# Patient Record
Sex: Female | Born: 1940 | Race: Black or African American | Hispanic: No | Smoking: Former smoker
Health system: Southern US, Community
[De-identification: ages and names within clinical notes are randomized; demographics above are authoritative.]

## PROBLEM LIST (undated history)

## (undated) DIAGNOSIS — K219 Gastro-esophageal reflux disease without esophagitis: Secondary | ICD-10-CM

## (undated) DIAGNOSIS — N185 Chronic kidney disease, stage 5: Secondary | ICD-10-CM

## (undated) DIAGNOSIS — R112 Nausea with vomiting, unspecified: Secondary | ICD-10-CM

## (undated) DIAGNOSIS — Z8619 Personal history of other infectious and parasitic diseases: Secondary | ICD-10-CM

## (undated) DIAGNOSIS — M1990 Unspecified osteoarthritis, unspecified site: Secondary | ICD-10-CM

## (undated) DIAGNOSIS — D649 Anemia, unspecified: Secondary | ICD-10-CM

## (undated) DIAGNOSIS — Z85038 Personal history of other malignant neoplasm of large intestine: Secondary | ICD-10-CM

## (undated) DIAGNOSIS — M879 Osteonecrosis, unspecified: Secondary | ICD-10-CM

## (undated) DIAGNOSIS — I10 Essential (primary) hypertension: Secondary | ICD-10-CM

## (undated) DIAGNOSIS — E119 Type 2 diabetes mellitus without complications: Secondary | ICD-10-CM

## (undated) DIAGNOSIS — E890 Postprocedural hypothyroidism: Secondary | ICD-10-CM

## (undated) DIAGNOSIS — D369 Benign neoplasm, unspecified site: Secondary | ICD-10-CM

## (undated) DIAGNOSIS — I2510 Atherosclerotic heart disease of native coronary artery without angina pectoris: Secondary | ICD-10-CM

## (undated) DIAGNOSIS — Z923 Personal history of irradiation: Secondary | ICD-10-CM

## (undated) DIAGNOSIS — H3530 Unspecified macular degeneration: Secondary | ICD-10-CM

## (undated) DIAGNOSIS — C50919 Malignant neoplasm of unspecified site of unspecified female breast: Secondary | ICD-10-CM

## (undated) DIAGNOSIS — I639 Cerebral infarction, unspecified: Secondary | ICD-10-CM

## (undated) DIAGNOSIS — I5032 Chronic diastolic (congestive) heart failure: Secondary | ICD-10-CM

## (undated) DIAGNOSIS — G629 Polyneuropathy, unspecified: Secondary | ICD-10-CM

## (undated) DIAGNOSIS — D696 Thrombocytopenia, unspecified: Secondary | ICD-10-CM

## (undated) DIAGNOSIS — H269 Unspecified cataract: Secondary | ICD-10-CM

## (undated) DIAGNOSIS — H409 Unspecified glaucoma: Secondary | ICD-10-CM

## (undated) DIAGNOSIS — N184 Chronic kidney disease, stage 4 (severe): Secondary | ICD-10-CM

## (undated) DIAGNOSIS — N186 End stage renal disease: Secondary | ICD-10-CM

## (undated) DIAGNOSIS — Z7901 Long term (current) use of anticoagulants: Secondary | ICD-10-CM

## (undated) DIAGNOSIS — Z9889 Other specified postprocedural states: Secondary | ICD-10-CM

## (undated) DIAGNOSIS — Z853 Personal history of malignant neoplasm of breast: Secondary | ICD-10-CM

## (undated) DIAGNOSIS — E11311 Type 2 diabetes mellitus with unspecified diabetic retinopathy with macular edema: Secondary | ICD-10-CM

## (undated) DIAGNOSIS — Z972 Presence of dental prosthetic device (complete) (partial): Secondary | ICD-10-CM

## (undated) DIAGNOSIS — I6529 Occlusion and stenosis of unspecified carotid artery: Secondary | ICD-10-CM

## (undated) DIAGNOSIS — K635 Polyp of colon: Secondary | ICD-10-CM

## (undated) DIAGNOSIS — I4891 Unspecified atrial fibrillation: Secondary | ICD-10-CM

## (undated) DIAGNOSIS — Z7902 Long term (current) use of antithrombotics/antiplatelets: Secondary | ICD-10-CM

## (undated) HISTORY — DX: Osteonecrosis, unspecified: M87.9

## (undated) HISTORY — DX: Gastro-esophageal reflux disease without esophagitis: K21.9

## (undated) HISTORY — DX: Personal history of other infectious and parasitic diseases: Z86.19

## (undated) HISTORY — DX: Unspecified osteoarthritis, unspecified site: M19.90

## (undated) HISTORY — DX: Type 2 diabetes mellitus without complications: E11.9

## (undated) HISTORY — PX: EYE SURGERY: SHX253

## (undated) HISTORY — DX: Unspecified macular degeneration: H35.30

## (undated) HISTORY — DX: Unspecified glaucoma: H40.9

## (undated) HISTORY — DX: Type 2 diabetes mellitus with unspecified diabetic retinopathy with macular edema: E11.311

## (undated) HISTORY — DX: Chronic kidney disease, stage 5: N18.5

## (undated) HISTORY — DX: Occlusion and stenosis of unspecified carotid artery: I65.29

## (undated) HISTORY — DX: Polyneuropathy, unspecified: G62.9

## (undated) HISTORY — DX: Unspecified cataract: H26.9

## (undated) HISTORY — PX: THYROIDECTOMY, PARTIAL: SHX18

## (undated) HISTORY — DX: Essential (primary) hypertension: I10

## (undated) HISTORY — DX: Chronic kidney disease, stage 4 (severe): N18.4

## (undated) HISTORY — PX: JOINT REPLACEMENT: SHX530

## (undated) HISTORY — DX: Personal history of malignant neoplasm of breast: Z85.3

## (undated) HISTORY — DX: Personal history of other malignant neoplasm of large intestine: Z85.038

## (undated) HISTORY — DX: Postprocedural hypothyroidism: E89.0

## (undated) HISTORY — PX: VEIN LIGATION AND STRIPPING: SHX2653

## (undated) HISTORY — DX: Anemia, unspecified: D64.9

---

## 1974-07-03 HISTORY — PX: ABDOMINAL HYSTERECTOMY: SHX81

## 1997-07-03 HISTORY — PX: TOTAL KNEE ARTHROPLASTY: SHX125

## 2003-07-04 HISTORY — PX: COLECTOMY: SHX59

## 2003-07-04 HISTORY — PX: CHOLECYSTECTOMY: SHX55

## 2004-07-03 DIAGNOSIS — Z85038 Personal history of other malignant neoplasm of large intestine: Secondary | ICD-10-CM

## 2004-07-03 HISTORY — DX: Personal history of other malignant neoplasm of large intestine: Z85.038

## 2004-07-03 HISTORY — PX: COLON SURGERY: SHX602

## 2006-07-03 DIAGNOSIS — Z853 Personal history of malignant neoplasm of breast: Secondary | ICD-10-CM

## 2006-07-03 DIAGNOSIS — C50919 Malignant neoplasm of unspecified site of unspecified female breast: Secondary | ICD-10-CM

## 2006-07-03 DIAGNOSIS — Z923 Personal history of irradiation: Secondary | ICD-10-CM

## 2006-07-03 HISTORY — PX: COLONOSCOPY: SHX174

## 2006-07-03 HISTORY — PX: BREAST LUMPECTOMY: SHX2

## 2006-07-03 HISTORY — PX: BREAST BIOPSY: SHX20

## 2006-07-03 HISTORY — PX: BREAST MAMMOSITE: SHX5264

## 2006-07-03 HISTORY — DX: Personal history of irradiation: Z92.3

## 2006-07-03 HISTORY — DX: Personal history of malignant neoplasm of breast: Z85.3

## 2006-07-03 HISTORY — DX: Malignant neoplasm of unspecified site of unspecified female breast: C50.919

## 2006-09-19 DIAGNOSIS — C50912 Malignant neoplasm of unspecified site of left female breast: Secondary | ICD-10-CM

## 2006-09-19 HISTORY — DX: Malignant neoplasm of unspecified site of left female breast: C50.912

## 2007-11-01 HISTORY — PX: US ECHOCARDIOGRAPHY: HXRAD669

## 2009-12-04 LAB — LIPID PANEL: HDL: 63 mg/dL (ref 35–70)

## 2012-01-01 DIAGNOSIS — N184 Chronic kidney disease, stage 4 (severe): Secondary | ICD-10-CM

## 2012-01-01 HISTORY — DX: Chronic kidney disease, stage 4 (severe): N18.4

## 2012-01-11 DIAGNOSIS — D518 Other vitamin B12 deficiency anemias: Secondary | ICD-10-CM | POA: Diagnosis not present

## 2012-01-11 DIAGNOSIS — Z1231 Encounter for screening mammogram for malignant neoplasm of breast: Secondary | ICD-10-CM | POA: Diagnosis not present

## 2012-01-11 DIAGNOSIS — E1065 Type 1 diabetes mellitus with hyperglycemia: Secondary | ICD-10-CM | POA: Diagnosis not present

## 2012-01-11 DIAGNOSIS — N189 Chronic kidney disease, unspecified: Secondary | ICD-10-CM | POA: Diagnosis not present

## 2012-01-11 DIAGNOSIS — G9009 Other idiopathic peripheral autonomic neuropathy: Secondary | ICD-10-CM | POA: Diagnosis not present

## 2012-01-11 DIAGNOSIS — E039 Hypothyroidism, unspecified: Secondary | ICD-10-CM | POA: Diagnosis not present

## 2012-01-11 DIAGNOSIS — I10 Essential (primary) hypertension: Secondary | ICD-10-CM | POA: Diagnosis not present

## 2012-01-11 DIAGNOSIS — R5381 Other malaise: Secondary | ICD-10-CM | POA: Diagnosis not present

## 2012-01-11 DIAGNOSIS — E538 Deficiency of other specified B group vitamins: Secondary | ICD-10-CM | POA: Diagnosis not present

## 2012-01-11 DIAGNOSIS — G609 Hereditary and idiopathic neuropathy, unspecified: Secondary | ICD-10-CM | POA: Diagnosis not present

## 2012-01-11 DIAGNOSIS — D649 Anemia, unspecified: Secondary | ICD-10-CM | POA: Diagnosis not present

## 2012-01-11 DIAGNOSIS — E7800 Pure hypercholesterolemia, unspecified: Secondary | ICD-10-CM | POA: Diagnosis not present

## 2012-01-11 DIAGNOSIS — R5383 Other fatigue: Secondary | ICD-10-CM | POA: Diagnosis not present

## 2012-01-11 DIAGNOSIS — R7989 Other specified abnormal findings of blood chemistry: Secondary | ICD-10-CM | POA: Diagnosis not present

## 2012-01-11 LAB — HEPATITIS C ANTIBODY: Hep C Virus Ab: NONREACTIVE

## 2012-01-11 LAB — COMPREHENSIVE METABOLIC PANEL
AST: 14 U/L
Alkaline Phosphatase: 91 U/L
Creat: 3.85
Potassium: 4.5 mmol/L
Total Bilirubin: 0.4 mg/dL

## 2012-01-11 LAB — CBC: WBC: 5.3

## 2012-01-11 LAB — TSH: TSH: 2.57

## 2012-01-11 LAB — T4, FREE: T4,Free (Direct): 0.8

## 2012-01-11 LAB — HOMOCYSTEINE: Homocysteine: 23.6

## 2012-01-17 DIAGNOSIS — E538 Deficiency of other specified B group vitamins: Secondary | ICD-10-CM | POA: Diagnosis not present

## 2012-01-17 DIAGNOSIS — M549 Dorsalgia, unspecified: Secondary | ICD-10-CM | POA: Diagnosis not present

## 2012-01-17 DIAGNOSIS — D518 Other vitamin B12 deficiency anemias: Secondary | ICD-10-CM | POA: Diagnosis not present

## 2012-01-17 DIAGNOSIS — M79609 Pain in unspecified limb: Secondary | ICD-10-CM | POA: Diagnosis not present

## 2012-01-17 DIAGNOSIS — E1065 Type 1 diabetes mellitus with hyperglycemia: Secondary | ICD-10-CM | POA: Diagnosis not present

## 2012-01-17 DIAGNOSIS — N189 Chronic kidney disease, unspecified: Secondary | ICD-10-CM | POA: Diagnosis not present

## 2012-01-17 DIAGNOSIS — I10 Essential (primary) hypertension: Secondary | ICD-10-CM | POA: Diagnosis not present

## 2012-01-22 DIAGNOSIS — C50919 Malignant neoplasm of unspecified site of unspecified female breast: Secondary | ICD-10-CM | POA: Diagnosis not present

## 2012-05-02 DIAGNOSIS — Z23 Encounter for immunization: Secondary | ICD-10-CM | POA: Diagnosis not present

## 2012-10-16 ENCOUNTER — Emergency Department: Payer: Self-pay | Admitting: Emergency Medicine

## 2012-10-16 DIAGNOSIS — M6281 Muscle weakness (generalized): Secondary | ICD-10-CM | POA: Diagnosis not present

## 2012-10-16 DIAGNOSIS — IMO0001 Reserved for inherently not codable concepts without codable children: Secondary | ICD-10-CM | POA: Diagnosis not present

## 2012-10-16 DIAGNOSIS — M169 Osteoarthritis of hip, unspecified: Secondary | ICD-10-CM | POA: Diagnosis not present

## 2012-10-16 DIAGNOSIS — M79609 Pain in unspecified limb: Secondary | ICD-10-CM | POA: Diagnosis not present

## 2012-10-16 DIAGNOSIS — Z760 Encounter for issue of repeat prescription: Secondary | ICD-10-CM | POA: Diagnosis not present

## 2012-10-16 DIAGNOSIS — R030 Elevated blood-pressure reading, without diagnosis of hypertension: Secondary | ICD-10-CM | POA: Diagnosis not present

## 2012-10-16 DIAGNOSIS — I10 Essential (primary) hypertension: Secondary | ICD-10-CM | POA: Diagnosis not present

## 2012-10-16 DIAGNOSIS — R109 Unspecified abdominal pain: Secondary | ICD-10-CM | POA: Diagnosis not present

## 2012-10-16 DIAGNOSIS — E119 Type 2 diabetes mellitus without complications: Secondary | ICD-10-CM | POA: Diagnosis not present

## 2012-10-16 DIAGNOSIS — M25559 Pain in unspecified hip: Secondary | ICD-10-CM | POA: Diagnosis not present

## 2012-10-16 DIAGNOSIS — E039 Hypothyroidism, unspecified: Secondary | ICD-10-CM | POA: Diagnosis not present

## 2012-10-16 LAB — COMPREHENSIVE METABOLIC PANEL WITH GFR
Albumin: 3.9 g/dL
Alkaline Phosphatase: 106 U/L
Anion Gap: 10
BUN: 22 mg/dL — ABNORMAL HIGH
Bilirubin,Total: 0.4 mg/dL
Calcium, Total: 9 mg/dL
Chloride: 106 mmol/L
Co2: 24 mmol/L
Creatinine: 3.93 mg/dL — ABNORMAL HIGH
Glucose: 262 mg/dL — ABNORMAL HIGH
Osmolality: 292
Potassium: 3.8 mmol/L
SGOT(AST): 19 U/L
SGPT (ALT): 21 U/L
Sodium: 140 mmol/L
Total Protein: 7.9 g/dL

## 2012-10-16 LAB — CBC
HGB: 11.7 g/dL — ABNORMAL LOW (ref 12.0–16.0)
MCH: 29.7 pg (ref 26.0–34.0)
MCHC: 32.8 g/dL (ref 32.0–36.0)
MCV: 91 fL (ref 80–100)
Platelet: 132 10*3/uL — ABNORMAL LOW (ref 150–440)
RBC: 3.95 10*6/uL (ref 3.80–5.20)
RDW: 14 % (ref 11.5–14.5)
WBC: 5.8 10*3/uL (ref 3.6–11.0)

## 2012-11-12 DIAGNOSIS — M5380 Other specified dorsopathies, site unspecified: Secondary | ICD-10-CM | POA: Diagnosis not present

## 2012-12-05 ENCOUNTER — Ambulatory Visit (INDEPENDENT_AMBULATORY_CARE_PROVIDER_SITE_OTHER): Payer: Medicare Other | Admitting: Family Medicine

## 2012-12-05 ENCOUNTER — Telehealth: Payer: Self-pay | Admitting: Family Medicine

## 2012-12-05 ENCOUNTER — Encounter: Payer: Self-pay | Admitting: Family Medicine

## 2012-12-05 VITALS — BP 160/90 | HR 80 | Temp 98.2°F | Ht 63.5 in | Wt 236.0 lb

## 2012-12-05 DIAGNOSIS — I6529 Occlusion and stenosis of unspecified carotid artery: Secondary | ICD-10-CM | POA: Insufficient documentation

## 2012-12-05 DIAGNOSIS — Z853 Personal history of malignant neoplasm of breast: Secondary | ICD-10-CM

## 2012-12-05 DIAGNOSIS — M879 Osteonecrosis, unspecified: Secondary | ICD-10-CM

## 2012-12-05 DIAGNOSIS — K219 Gastro-esophageal reflux disease without esophagitis: Secondary | ICD-10-CM

## 2012-12-05 DIAGNOSIS — M8700 Idiopathic aseptic necrosis of unspecified bone: Secondary | ICD-10-CM

## 2012-12-05 DIAGNOSIS — R0989 Other specified symptoms and signs involving the circulatory and respiratory systems: Secondary | ICD-10-CM | POA: Diagnosis not present

## 2012-12-05 DIAGNOSIS — M1990 Unspecified osteoarthritis, unspecified site: Secondary | ICD-10-CM | POA: Diagnosis not present

## 2012-12-05 DIAGNOSIS — E119 Type 2 diabetes mellitus without complications: Secondary | ICD-10-CM | POA: Diagnosis not present

## 2012-12-05 DIAGNOSIS — I10 Essential (primary) hypertension: Secondary | ICD-10-CM

## 2012-12-05 DIAGNOSIS — Z85038 Personal history of other malignant neoplasm of large intestine: Secondary | ICD-10-CM

## 2012-12-05 DIAGNOSIS — E890 Postprocedural hypothyroidism: Secondary | ICD-10-CM | POA: Diagnosis not present

## 2012-12-05 DIAGNOSIS — E118 Type 2 diabetes mellitus with unspecified complications: Secondary | ICD-10-CM | POA: Insufficient documentation

## 2012-12-05 DIAGNOSIS — E1169 Type 2 diabetes mellitus with other specified complication: Secondary | ICD-10-CM | POA: Insufficient documentation

## 2012-12-05 MED ORDER — CARVEDILOL 25 MG PO TABS: 25.0000 mg | ORAL_TABLET | Freq: Every day | ORAL | Status: DC

## 2012-12-05 MED ORDER — THYROID 60 MG PO TABS: 60.0000 mg | ORAL_TABLET | Freq: Every day | ORAL | Status: DC

## 2012-12-05 MED ORDER — FUROSEMIDE 40 MG PO TABS: 40.0000 mg | ORAL_TABLET | Freq: Every day | ORAL | Status: DC

## 2012-12-05 MED ORDER — ANASTROZOLE 1 MG PO TABS: 1.0000 mg | ORAL_TABLET | Freq: Every day | ORAL | Status: DC

## 2012-12-05 MED ORDER — GLIMEPIRIDE 1 MG PO TABS: 1.0000 mg | ORAL_TABLET | Freq: Every day | ORAL | Status: DC

## 2012-12-05 MED ORDER — ISOSORBIDE DINITRATE 20 MG PO TABS: 20.0000 mg | ORAL_TABLET | Freq: Every day | ORAL | Status: DC

## 2012-12-05 MED ORDER — SAXAGLIPTIN HCL 5 MG PO TABS: 5.0000 mg | ORAL_TABLET | Freq: Every day | ORAL | Status: DC

## 2012-12-05 MED ORDER — HYDRALAZINE HCL 50 MG PO TABS: 50.0000 mg | ORAL_TABLET | ORAL | Status: DC

## 2012-12-05 MED ORDER — MELOXICAM 15 MG PO TABS: 15.0000 mg | ORAL_TABLET | Freq: Every day | ORAL | Status: DC

## 2012-12-05 MED ORDER — ALLOPURINOL 100 MG PO TABS: 100.0000 mg | ORAL_TABLET | Freq: Every day | ORAL | Status: DC

## 2012-12-05 MED ORDER — PENTOXIFYLLINE ER 400 MG PO TBCR: 400.0000 mg | EXTENDED_RELEASE_TABLET | Freq: Every day | ORAL | Status: DC

## 2012-12-05 MED ORDER — MELOXICAM 15 MG PO TABS: 15.0000 mg | ORAL_TABLET | Freq: Every day | ORAL | Status: DC | PRN

## 2012-12-05 MED ORDER — CYCLOBENZAPRINE HCL 5 MG PO TABS: 5.0000 mg | ORAL_TABLET | Freq: Four times a day (QID) | ORAL | Status: DC | PRN

## 2012-12-05 NOTE — Assessment & Plan Note (Signed)
Await records.  Doesn't sound like she's been on bisphosphonate in past.

## 2012-12-05 NOTE — Assessment & Plan Note (Signed)
Check A1c when returns fasting for blood work prior to [facility] visit. Review #s then. Continue onglyza and amaryl until then. Endorses good control with last A1c 6.9%

## 2012-12-05 NOTE — Telephone Encounter (Signed)
Yes this is right according to patient.

## 2012-12-05 NOTE — Telephone Encounter (Signed)
Pharmacy advised  

## 2012-12-05 NOTE — Assessment & Plan Note (Signed)
New, bilateral.  Pt has not had imaging done in past - will check [place]. Risk factors present include HTN, DM.   On trental for presumed PAD.

## 2012-12-05 NOTE — Assessment & Plan Note (Signed)
S/p colectomy. Per pt last colonoscopy 2010, due next year.

## 2012-12-05 NOTE — Assessment & Plan Note (Signed)
Continue armour thyroid.  Will need TSH next visit.

## 2012-12-05 NOTE — Assessment & Plan Note (Signed)
Elevated today, anticipate due to new doctor and office.  Pt states runs low at home.  Advised to monitor and notify me if persistently >140/90.  No changes today.

## 2012-12-05 NOTE — Assessment & Plan Note (Signed)
Continue meloxicam.  Will need Cr checked at physical. Did not tolerate tramadol from ER.

## 2012-12-05 NOTE — Telephone Encounter (Signed)
[facility] asking for clarification on Hydralazine RX sent in today.  RX says Take 1 tablet (50mg ) total by mouth 2 times a week.  They want to make sure it is to be taken weekly.  Please advise.

## 2012-12-05 NOTE — Patient Instructions (Addendum)
Schedule appointment with [facility]. Pass by [facility] office to schedule appointment for neck artery ultrasound. Return at your convenience fasting for blood work and afterwards for [facility] visit. Let me know sooner if blood pressure consistently >140/90 at home. Good to meet you today, call us with questions.

## 2012-12-05 NOTE — Progress Notes (Signed)
Subjective:    Patient ID: Alexis Tucker, female    DOB: [birth year], 72 y.o.   MRN: [id number]  HPI CC: new pt to establish  Prior saw Dr. Thurston Hole in [place].  Records have been requested.  Stays stressed - brother died 11-12-11, daughter had stroke 3 yrs ago.  HTN - elevated today.  Normally runs low at home. DM - states last A1c was 6.9%.  Due for eye exam. Denies h/o HLD. Hypothyroid after surgical thyroidectomy. States no h/o cardiac problems.  Recent trip to [facility] for arthritis, treated with flexeril and tramadol.  Tramadol caused nightmares so she stopped.  Preventative: Last CPE 2008 Well woman was 2008 (OBGYN at [facility]).  Looking to establish with local OBGYN. Colonoscopy 2010 - rec rpt 5 yrs. Mammogram 01/2012, normal.  Due next month. Pneumovax 2011 Flu shot - yearly.  Lives alone, 1 dog.   Granddaughter in [place]. Divorced Occu: retired, worked in Engineer, technical sales Edu: college  Medications and allergies reviewed and updated in chart.  Past histories reviewed and updated if relevant as below. There are no active problems to display for this patient.  Past Medical History  Diagnosis Date  . Post-surgical hypothyroidism   . OA (osteoarthritis)   . Diabetes type 2, controlled 2000s  . HTN (hypertension)   . Osteonecrosis     hips?  . History of breast cancer 2008    s/p mammosite  . History of colon cancer 2006    s/p colectomy  . GERD (gastroesophageal reflux disease)   . Hepatitis A 1990s    from food, resolved   Past Surgical History  Procedure Laterality Date  . Abdominal hysterectomy  1976    after tubal pregnancy, h/o fibroids with heavy bleeding  . Total knee arthroplasty Right 1999  . Thyroidectomy, partial      unclear why  . Cholecystectomy  2005  . Colectomy  2005    colon cancer  . Colon surgery  2006    bowel obstruction  . Breast mammosite  2008   History  Substance Use Topics  . Smoking status: Never Smoker   . Smokeless tobacco:  Never Used  . Alcohol Use: No     Comment: rare   Family History  Problem Relation Age of Onset  . Diabetes Mother   . Hypertension Mother   . Alcohol abuse Father   . Arthritis Mother   . Arthritis Father   . Stroke Daughter   . CAD Neg Hx   . Cancer Neg Hx    Allergies  Allergen Reactions  . Codeine   . Tramadol Other (See Comments)    nightmares  . Sulfa Antibiotics Itching and Rash   No current outpatient prescriptions on file prior to visit.   No current facility-administered medications on file prior to visit.     Review of Systems  Constitutional: Negative for fever, chills, activity change, appetite change, fatigue and unexpected weight change.  HENT: Negative for hearing loss and neck pain.   Eyes: Negative for visual disturbance.  Respiratory: Negative for cough, chest tightness, shortness of breath and wheezing.   Cardiovascular: Negative for chest pain, palpitations and leg swelling.  Gastrointestinal: Negative for nausea, vomiting, abdominal pain, diarrhea, constipation, blood in stool and abdominal distention.  Genitourinary: Negative for hematuria and difficulty urinating.  Musculoskeletal: Negative for myalgias and arthralgias.  Skin: Negative for rash.  Neurological: Negative for dizziness, seizures, syncope and headaches.  Hematological: Negative for adenopathy. Does not bruise/bleed easily.  Psychiatric/Behavioral:  Negative for dysphoric mood. The patient is not nervous/anxious.        Objective:   Physical Exam  Nursing note and vitals reviewed. Constitutional: She is oriented to person, place, and time. She appears well-developed and well-nourished. No distress.  Stiff with movements  HENT:  Head: Normocephalic and atraumatic.  Mouth/Throat: Oropharynx is clear and moist. No oropharyngeal exudate.  Eyes: Conjunctivae and EOM are normal. Pupils are equal, round, and reactive to light. No scleral icterus.  Neck: Normal range of motion. Neck  supple. Carotid bruit is present (R>L bruit). No thyromegaly present.  Cardiovascular: Normal rate, regular rhythm, normal heart sounds and intact distal pulses.   No murmur heard. Pulses:      Radial pulses are 2+ on the right side, and 2+ on the left side.  Pulmonary/Chest: Effort normal and breath sounds normal. No respiratory distress. She has no wheezes. She has no rales.  Abdominal: Soft. Bowel sounds are normal. She exhibits no distension and no mass. There is no tenderness. There is no rebound and no guarding.  Musculoskeletal: Normal range of motion. She exhibits no edema.  Lymphadenopathy:    She has no cervical adenopathy.  Neurological: She is alert and oriented to person, place, and time.  CN grossly intact, station and gait intact  Skin: Skin is warm and dry. No rash noted.  Psychiatric: She has a normal mood and affect. Her behavior is normal. Judgment and thought content normal.       Assessment & Plan:

## 2012-12-05 NOTE — Assessment & Plan Note (Signed)
2008, s/p mammosite, currently on arimidex Gets mammogram yearly, due next month.  Requests to defer scheduling until wellness exam.

## 2012-12-15 ENCOUNTER — Encounter: Payer: Self-pay | Admitting: Family Medicine

## 2012-12-16 ENCOUNTER — Telehealth: Payer: Self-pay | Admitting: Family Medicine

## 2012-12-16 DIAGNOSIS — H524 Presbyopia: Secondary | ICD-10-CM | POA: Diagnosis not present

## 2012-12-16 DIAGNOSIS — H2510 Age-related nuclear cataract, unspecified eye: Secondary | ICD-10-CM | POA: Diagnosis not present

## 2012-12-16 DIAGNOSIS — H40019 Open angle with borderline findings, low risk, unspecified eye: Secondary | ICD-10-CM | POA: Diagnosis not present

## 2012-12-16 DIAGNOSIS — H3530 Unspecified macular degeneration: Secondary | ICD-10-CM | POA: Diagnosis not present

## 2012-12-16 DIAGNOSIS — E119 Type 2 diabetes mellitus without complications: Secondary | ICD-10-CM | POA: Diagnosis not present

## 2012-12-16 NOTE — Telephone Encounter (Signed)
plxz notify I reviewed records from prior PCP - she has significant kidney insufficiency and chronic kidney disease that merits close f/u - I want to be sure she's scheduled for fasting labwork and afterwards for medicare wellness visit - will likely recommend establishing with kidney doctor.

## 2012-12-18 ENCOUNTER — Encounter: Payer: Self-pay | Admitting: Family Medicine

## 2012-12-18 NOTE — Telephone Encounter (Signed)
Message left for patient to return my call.  

## 2012-12-19 ENCOUNTER — Encounter: Payer: Self-pay | Admitting: Family Medicine

## 2012-12-20 ENCOUNTER — Encounter: Payer: Self-pay | Admitting: Family Medicine

## 2012-12-23 ENCOUNTER — Encounter: Payer: Self-pay | Admitting: Family Medicine

## 2012-12-24 NOTE — Telephone Encounter (Signed)
Message left for patient to return my call.  

## 2012-12-25 ENCOUNTER — Encounter: Payer: Self-pay | Admitting: Family Medicine

## 2012-12-27 NOTE — Telephone Encounter (Signed)
Message left for patient to return my call.  

## 2012-12-31 ENCOUNTER — Encounter: Payer: Self-pay | Admitting: *Deleted

## 2012-12-31 HISTORY — PX: OTHER SURGICAL HISTORY: SHX169

## 2012-12-31 NOTE — Telephone Encounter (Signed)
Message left for patient to return my call and schedule appts. Letter mailed also since unable to reach patient by phone.

## 2013-01-06 ENCOUNTER — Other Ambulatory Visit: Payer: Self-pay | Admitting: Family Medicine

## 2013-01-06 ENCOUNTER — Other Ambulatory Visit (INDEPENDENT_AMBULATORY_CARE_PROVIDER_SITE_OTHER): Payer: Medicare Other

## 2013-01-06 DIAGNOSIS — I10 Essential (primary) hypertension: Secondary | ICD-10-CM

## 2013-01-06 DIAGNOSIS — E119 Type 2 diabetes mellitus without complications: Secondary | ICD-10-CM

## 2013-01-06 DIAGNOSIS — E890 Postprocedural hypothyroidism: Secondary | ICD-10-CM

## 2013-01-06 DIAGNOSIS — N184 Chronic kidney disease, stage 4 (severe): Secondary | ICD-10-CM

## 2013-01-06 DIAGNOSIS — E1122 Type 2 diabetes mellitus with diabetic chronic kidney disease: Secondary | ICD-10-CM | POA: Insufficient documentation

## 2013-01-06 LAB — RENAL FUNCTION PANEL
Albumin: 3.7 g/dL (ref 3.5–5.2)
BUN: 35 mg/dL — ABNORMAL HIGH (ref 6–23)
CO2: 20 mEq/L (ref 19–32)
Chloride: 112 mEq/L (ref 96–112)
GFR: 13 mL/min — CL (ref >60.00)
Phosphorus: 4.1 mg/dL (ref 2.3–4.6)
Potassium: 4.7 mEq/L (ref 3.5–5.1)

## 2013-01-06 LAB — CBC WITH DIFFERENTIAL/PLATELET
Basophils Absolute: 0 10*3/uL (ref 0.0–0.1)
Basophils Relative: 0.6 % (ref 0.0–3.0)
Eosinophils Absolute: 0.1 10*3/uL (ref 0.0–0.7)
Hemoglobin: 10.7 g/dL — ABNORMAL LOW (ref 12.0–15.0)
Lymphocytes Relative: 28.5 % (ref 12.0–46.0)
MCHC: 32 g/dL (ref 30.0–36.0)
Monocytes Relative: 11.2 % (ref 3.0–12.0)
Neutro Abs: 2.9 10*3/uL (ref 1.4–7.7)
Neutrophils Relative %: 57.2 % (ref 43.0–77.0)
RBC: 3.62 Mil/uL — ABNORMAL LOW (ref 3.87–5.11)
WBC: 5.1 10*3/uL (ref 4.5–10.5)

## 2013-01-06 LAB — LIPID PANEL: Cholesterol: 178 mg/dL (ref 0–200)

## 2013-01-06 LAB — HEMOGLOBIN A1C: Hgb A1c MFr Bld: 7.6 % — ABNORMAL HIGH (ref 4.6–6.5)

## 2013-01-07 ENCOUNTER — Ambulatory Visit (INDEPENDENT_AMBULATORY_CARE_PROVIDER_SITE_OTHER): Payer: Medicare Other | Admitting: Family Medicine

## 2013-01-07 ENCOUNTER — Encounter: Payer: Self-pay | Admitting: Family Medicine

## 2013-01-07 VITALS — BP 122/64 | HR 68 | Temp 97.8°F | Ht 63.5 in | Wt 241.0 lb

## 2013-01-07 DIAGNOSIS — R0989 Other specified symptoms and signs involving the circulatory and respiratory systems: Secondary | ICD-10-CM

## 2013-01-07 DIAGNOSIS — Z1231 Encounter for screening mammogram for malignant neoplasm of breast: Secondary | ICD-10-CM | POA: Diagnosis not present

## 2013-01-07 DIAGNOSIS — N184 Chronic kidney disease, stage 4 (severe): Secondary | ICD-10-CM | POA: Diagnosis not present

## 2013-01-07 DIAGNOSIS — E119 Type 2 diabetes mellitus without complications: Secondary | ICD-10-CM | POA: Diagnosis not present

## 2013-01-07 DIAGNOSIS — I10 Essential (primary) hypertension: Secondary | ICD-10-CM

## 2013-01-07 DIAGNOSIS — M1990 Unspecified osteoarthritis, unspecified site: Secondary | ICD-10-CM

## 2013-01-07 DIAGNOSIS — E890 Postprocedural hypothyroidism: Secondary | ICD-10-CM

## 2013-01-07 DIAGNOSIS — Z0000 Encounter for general adult medical examination without abnormal findings: Secondary | ICD-10-CM

## 2013-01-07 DIAGNOSIS — Z853 Personal history of malignant neoplasm of breast: Secondary | ICD-10-CM | POA: Diagnosis not present

## 2013-01-07 LAB — TSH: TSH: 3.39 u[IU]/mL (ref 0.35–5.50)

## 2013-01-07 LAB — PTH, INTACT AND CALCIUM: Calcium: 8.9 mg/dL (ref 8.4–10.5)

## 2013-01-07 MED ORDER — DICLOFENAC SODIUM 1 % TD GEL: 1.0000 "application " | Freq: Three times a day (TID) | TRANSDERMAL | Status: DC

## 2013-01-07 NOTE — Assessment & Plan Note (Signed)
Continue to follow with ortho. I advised she should not take any NSAIDs including mobic and aleve.

## 2013-01-07 NOTE — Progress Notes (Signed)
Subjective:    Patient ID: Alexis Tucker, female    DOB: [birth year], 72 y.o.   MRN: [id number]  HPI CC medicare wellness visit  Some tinnitus. 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  Prior PCP was nephrologist.  I have suggested referral to nephrology today. Pending ultrasound of carotids for bruit heard last visit.  Recently saw eye doctor. Known R eye trouble Passes hearing screen today. Fall in garage recently - when walking groceries into house, tripped over dog and groceries.  Did not injure herself. Denies depression/anhedonia, sadness.  Preventative:  No h/o medicare wellness visit.Well woman was 2008 (OBGYN at [facility]). Looking to establish with local OBGYN.  Planning on calling to establish with local. Colonoscopy 2010 - rec rpt 5 yrs.  Mammogram 01/2012, normal. Due this month. Would like to set up at [facility]. Pneumovax 2011  Flu shot - yearly.  Tetanus - unsure, thinks >10 yrs. Shingles shot - would like to receive at [facility]. dexa scan - has had, thinks about 10 yrs ago. Advanced directives/living will - has discussed with daughter.would like info on this.  Lives alone, 1 dog. Granddaughter in [place].  Divorced  Occu: retired, worked in Engineer, technical sales  Edu: college  Medications and allergies reviewed and updated in chart.  Past histories reviewed and updated if relevant as below. Patient Active Problem List   Diagnosis Date Noted  . Medicare annual wellness visit, initial 01/07/2013  . Chronic kidney disease, stage IV (severe) 01/06/2013  . Carotid bruit 12/05/2012  . Post-surgical hypothyroidism   . OA (osteoarthritis)   . Diabetes type 2, controlled   . HTN (hypertension)   . Osteonecrosis   . History of breast cancer   . History of colon cancer   . GERD (gastroesophageal reflux disease)    Past Medical History  Diagnosis Date  . Post-surgical hypothyroidism   . OA (osteoarthritis)   .  Diabetes type 2, controlled 2000s  . HTN (hypertension)   . Osteonecrosis     hips?  . 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 History of colon cancer 2006    s/p colectomy  . GERD (gastroesophageal reflux disease)   . History of hepatitis A 1990s    from food, resolved  . CKD (chronic kidney disease) stage 4, GFR 15-29 ml/min 01/2012    per prior pcp records, Cr 3.85, in 3s since 2010  . Neuropathy     per prior pcp  . Anemia     per prior pcp  . DJD (degenerative joint disease)     per prior pcp  . 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 Cataracts, bilateral    Past Surgical History  Procedure Laterality Date  . Abdominal hysterectomy  1976    after tubal pregnancy, h/o fibroids with heavy bleeding  . Total knee arthroplasty Right 1999  . Thyroidectomy, partial      unclear why  . Cholecystectomy  2005  . Colectomy  2005    colon cancer  . Colon surgery  2006    bowel obstruction  . Breast mammosite  2008  . Colonoscopy  2008    ext hem, ileo-colonic anastomosis in cecum, tubular adenoma x1, rec rpt 1 yr for multiple polyps (in [place])  . US echocardiography  11/2007    nl LV fxn EF 60%,  diastolic dysfunction, LVH, tr TR and MR, slcerotic aortic valve   History  Substance Use Topics  . Smoking status: Former Research scientist (life sciences)  . Smokeless tobacco: Never Used  . Alcohol Use: No     Comment: rare   Family History  Problem Relation Age of Onset  . Diabetes Mother   . Hypertension Mother   . Alcohol abuse Father   . Arthritis Mother   . Arthritis Father   . Stroke Daughter   . CAD Neg Hx   . Cancer Neg Hx    Allergies  Allergen Reactions  . Codeine   . Tramadol Other (See Comments)    nightmares  . Sulfa Antibiotics Itching and Rash   Current Outpatient Prescriptions on File Prior to Visit  Medication Sig Dispense Refill  . Acetylcysteine (N-ACETYL-L-CYSTEINE) 600 MG CAPS Take 1  capsule by mouth daily.      Marland Kitchen allopurinol (ZYLOPRIM) 100 MG tablet Take 1 tablet (100 mg total) by mouth daily.  90 tablet  3  . Alpha-Lipoic Acid 100 MG CAPS Take 1 capsule by mouth daily.      Marland Kitchen anastrozole (ARIMIDEX) 1 MG tablet Take 1 tablet (1 mg total) by mouth daily.  90 tablet  3  . Calcium Carb-Cholecalciferol (CALCIUM 1000 + D PO) Take 1 tablet by mouth.      . carvedilol (COREG) 25 MG tablet Take 1 tablet (25 mg total) by mouth daily.  90 tablet  3  . Cholecalciferol (VITAMIN D-3) 5000 UNITS TABS Take 1 tablet by mouth daily.      . Coenzyme Q10 75 MG CAPS Take 1 capsule by mouth daily.      . Cyanocobalamin (B-12) 2500 MCG SUBL Place 1 tablet under the tongue daily.      . cyclobenzaprine (FLEXERIL) 5 MG tablet Take 1 tablet (5 mg total) by mouth 4 (four) times daily as needed.  30 tablet  3  . Deanol Bitartrate (DMAE BITARTRATE) POWD by Does not apply route.      . folic acid (FOLVITE) Q000111Q MCG tablet Take 400 mcg by mouth daily.      . furosemide (LASIX) 40 MG tablet Take 1 tablet (40 mg total) by mouth daily.  90 tablet  3  . glimepiride (AMARYL) 1 MG tablet Take 1 tablet (1 mg total) by mouth daily before breakfast.  90 tablet  3  . hydrALAZINE (APRESOLINE) 50 MG tablet Take 1 tablet (50 mg total) by mouth 2 (two) times a week.  90 tablet  3  . isosorbide dinitrate (ISORDIL) 20 MG tablet Take 1 tablet (20 mg total) by mouth daily.  90 tablet  3  . LevOCARNitine L-Tartrate (L-CARNITINE) 500 MG CAPS Take 1 capsule by mouth daily.      . NON FORMULARY Chinese chlorella 500mg       . NON FORMULARY Immune essentials, phosphatidylcholine, iodoral, ultimate flora probiotic      . Omega Fatty Acids-Phytosterols (PA FISH OIL/PHYTOSTEROLS) 450-400 MG CAPS Take 1 tablet by mouth daily.      . pentoxifylline (TRENTAL) 400 MG CR tablet Take 1 tablet (400 mg total) by mouth daily.  90 tablet  3  . Raspberry Ketones 100 MG CAPS Take 1 capsule by mouth daily.      . saxagliptin HCl (ONGLYZA) 5  MG TABS tablet Take 1 tablet (5 mg total) by mouth daily.  90 tablet  3  . selenium 50 MCG TABS Take 50 mcg by mouth daily.      Marland Kitchen  thyroid (ARMOUR) 60 MG tablet Take 1 tablet (60 mg total) by mouth daily.  90 tablet  3   No current facility-administered medications on file prior to visit.     Review of Systems  Constitutional: Negative for fever, chills, activity change, appetite change, fatigue and unexpected weight change.  HENT: Negative for hearing loss and neck pain.   Eyes: Negative for visual disturbance.  Respiratory: Negative for cough, chest tightness, shortness of breath and wheezing.   Cardiovascular: Negative for chest pain, palpitations and leg swelling.  Gastrointestinal: Negative for nausea, vomiting, abdominal pain, diarrhea, constipation, blood in stool and abdominal distention.  Genitourinary: Negative for hematuria and difficulty urinating.  Musculoskeletal: Negative for myalgias and arthralgias.  Skin: Negative for rash.  Neurological: Negative for dizziness, seizures, syncope and headaches.  Hematological: Negative for adenopathy. Does not bruise/bleed easily.  Psychiatric/Behavioral: Negative for dysphoric mood. The patient is not nervous/anxious.        Objective:   Physical Exam  Nursing note and vitals reviewed. Constitutional: She is oriented to person, place, and time. She appears well-developed and well-nourished. No distress.  Stiff with movements  HENT:  Head: Normocephalic and atraumatic.  Right Ear: External ear normal.  Left Ear: External ear normal.  Nose: Nose normal.  Mouth/Throat: Oropharynx is clear and moist. No oropharyngeal exudate.  Eyes: Conjunctivae and EOM are normal. Pupils are equal, round, and reactive to light. No scleral icterus.  Neck: Normal range of motion. Neck supple. Carotid bruit is present (R>L bruit). No thyromegaly present.  Cardiovascular: Normal rate, regular rhythm, normal heart sounds and intact distal pulses.   No  murmur heard. Pulses:      Radial pulses are 2+ on the right side, and 2+ on the left side.  Pulmonary/Chest: Effort normal and breath sounds normal. No respiratory distress. She has no wheezes. She has no rales.  Abdominal: Soft. Bowel sounds are normal. She exhibits no distension and no mass. There is no tenderness. There is no rebound and no guarding.  Musculoskeletal: Normal range of motion. She exhibits no edema.  Lymphadenopathy:    She has no cervical adenopathy.  Neurological: She is alert and oriented to person, place, and time.  CN grossly intact, station and gait intact  Skin: Skin is warm and dry. No rash noted.  Psychiatric: She has a normal mood and affect. Her behavior is normal. Judgment and thought content normal.       Assessment & Plan:

## 2013-01-07 NOTE — Assessment & Plan Note (Signed)
Stage 4-5 today.  Refer to nephrologist to establish.

## 2013-01-07 NOTE — Assessment & Plan Note (Signed)
I have personally reviewed the Medicare Annual Wellness questionnaire and have noted 1. The patient's medical and social history 2. Their use of alcohol, tobacco or illicit drugs 3. Their current medications and supplements 4. The patient's functional ability including ADL's, fall risks, home safety risks and hearing or visual impairment. 5. Diet and physical activity 6. Evidence for depression or mood disorders The patients weight, height, BMI have been recorded in the chart.  Hearing and vision has been addressed. I have made referrals, counseling and provided education to the patient based review of the above and I have provided the pt with a written personalized care plan for preventive services. See scanned questionairre. Advanced directives discussed: provided with packet today.  Pt interested in reading about this.  Reviewed preventative protocols and updated unless pt declined. Colonoscopy due next year. Mammogram referral placed today.

## 2013-01-07 NOTE — Assessment & Plan Note (Signed)
Chronic, stable. Continue meds. 

## 2013-01-07 NOTE — Patient Instructions (Addendum)
Let's stop meloxicam and aleve - as these can affect kidneys.  May try voltaren gel and may use tylenol. Pass by [facility]'s office for referral to establish with kidney doctor and for mammogram referral. Advanced directive packet info provided today. Good to see you today, call us with questions. Return as needed or in 3 months for follow up. No changes to medicines otherwise today.

## 2013-01-07 NOTE — Assessment & Plan Note (Signed)
TSH stable.  

## 2013-01-07 NOTE — Assessment & Plan Note (Signed)
Referred for mammogram today.

## 2013-01-07 NOTE — Addendum Note (Signed)
Addended by: Ria Bush on: 01/07/2013 04:28 PM   Modules accepted: Orders

## 2013-01-07 NOTE — Assessment & Plan Note (Signed)
Elevated A1c, however no changes today. will start with establishing local nephrologist.

## 2013-01-07 NOTE — Assessment & Plan Note (Signed)
Ultrasound pending for tomorrow.

## 2013-01-08 ENCOUNTER — Encounter (INDEPENDENT_AMBULATORY_CARE_PROVIDER_SITE_OTHER): Payer: Medicare Other

## 2013-01-08 DIAGNOSIS — I6529 Occlusion and stenosis of unspecified carotid artery: Secondary | ICD-10-CM

## 2013-01-08 DIAGNOSIS — R0989 Other specified symptoms and signs involving the circulatory and respiratory systems: Secondary | ICD-10-CM | POA: Diagnosis not present

## 2013-01-09 ENCOUNTER — Other Ambulatory Visit: Payer: Self-pay

## 2013-01-10 DIAGNOSIS — I6529 Occlusion and stenosis of unspecified carotid artery: Secondary | ICD-10-CM

## 2013-01-10 HISTORY — DX: Occlusion and stenosis of unspecified carotid artery: I65.29

## 2013-01-12 ENCOUNTER — Encounter: Payer: Self-pay | Admitting: Family Medicine

## 2013-01-16 DIAGNOSIS — H4010X0 Unspecified open-angle glaucoma, stage unspecified: Secondary | ICD-10-CM | POA: Diagnosis not present

## 2013-02-04 ENCOUNTER — Ambulatory Visit: Payer: Self-pay | Admitting: Family Medicine

## 2013-02-04 DIAGNOSIS — Z9889 Other specified postprocedural states: Secondary | ICD-10-CM | POA: Diagnosis not present

## 2013-02-04 DIAGNOSIS — Z853 Personal history of malignant neoplasm of breast: Secondary | ICD-10-CM | POA: Diagnosis not present

## 2013-02-04 DIAGNOSIS — R928 Other abnormal and inconclusive findings on diagnostic imaging of breast: Secondary | ICD-10-CM | POA: Diagnosis not present

## 2013-02-06 DIAGNOSIS — E1129 Type 2 diabetes mellitus with other diabetic kidney complication: Secondary | ICD-10-CM | POA: Diagnosis not present

## 2013-02-06 DIAGNOSIS — D649 Anemia, unspecified: Secondary | ICD-10-CM | POA: Diagnosis not present

## 2013-02-06 DIAGNOSIS — IMO0001 Reserved for inherently not codable concepts without codable children: Secondary | ICD-10-CM | POA: Diagnosis not present

## 2013-02-06 DIAGNOSIS — N185 Chronic kidney disease, stage 5: Secondary | ICD-10-CM | POA: Diagnosis not present

## 2013-02-06 DIAGNOSIS — I10 Essential (primary) hypertension: Secondary | ICD-10-CM | POA: Diagnosis not present

## 2013-02-10 ENCOUNTER — Encounter: Payer: Self-pay | Admitting: Family Medicine

## 2013-02-11 ENCOUNTER — Encounter: Payer: Self-pay | Admitting: Family Medicine

## 2013-02-13 ENCOUNTER — Encounter: Payer: Self-pay | Admitting: *Deleted

## 2013-02-24 DIAGNOSIS — E785 Hyperlipidemia, unspecified: Secondary | ICD-10-CM | POA: Diagnosis not present

## 2013-02-24 DIAGNOSIS — N185 Chronic kidney disease, stage 5: Secondary | ICD-10-CM | POA: Diagnosis not present

## 2013-02-24 DIAGNOSIS — I10 Essential (primary) hypertension: Secondary | ICD-10-CM | POA: Diagnosis not present

## 2013-02-24 DIAGNOSIS — Z01810 Encounter for preprocedural cardiovascular examination: Secondary | ICD-10-CM | POA: Diagnosis not present

## 2013-02-28 ENCOUNTER — Encounter: Payer: Self-pay | Admitting: Family Medicine

## 2013-03-17 DIAGNOSIS — E1129 Type 2 diabetes mellitus with other diabetic kidney complication: Secondary | ICD-10-CM | POA: Diagnosis not present

## 2013-03-17 DIAGNOSIS — N185 Chronic kidney disease, stage 5: Secondary | ICD-10-CM | POA: Diagnosis not present

## 2013-03-17 DIAGNOSIS — D631 Anemia in chronic kidney disease: Secondary | ICD-10-CM | POA: Diagnosis not present

## 2013-03-17 DIAGNOSIS — I10 Essential (primary) hypertension: Secondary | ICD-10-CM | POA: Diagnosis not present

## 2013-03-27 DIAGNOSIS — N184 Chronic kidney disease, stage 4 (severe): Secondary | ICD-10-CM | POA: Diagnosis not present

## 2013-03-27 DIAGNOSIS — E785 Hyperlipidemia, unspecified: Secondary | ICD-10-CM | POA: Diagnosis not present

## 2013-03-27 DIAGNOSIS — I10 Essential (primary) hypertension: Secondary | ICD-10-CM | POA: Diagnosis not present

## 2013-04-11 DIAGNOSIS — E1129 Type 2 diabetes mellitus with other diabetic kidney complication: Secondary | ICD-10-CM | POA: Diagnosis not present

## 2013-04-11 DIAGNOSIS — I10 Essential (primary) hypertension: Secondary | ICD-10-CM | POA: Diagnosis not present

## 2013-04-11 DIAGNOSIS — N185 Chronic kidney disease, stage 5: Secondary | ICD-10-CM | POA: Diagnosis not present

## 2013-04-11 DIAGNOSIS — Z23 Encounter for immunization: Secondary | ICD-10-CM | POA: Diagnosis not present

## 2013-04-16 DIAGNOSIS — N185 Chronic kidney disease, stage 5: Secondary | ICD-10-CM | POA: Diagnosis not present

## 2013-04-16 DIAGNOSIS — E1129 Type 2 diabetes mellitus with other diabetic kidney complication: Secondary | ICD-10-CM | POA: Diagnosis not present

## 2013-04-16 DIAGNOSIS — I10 Essential (primary) hypertension: Secondary | ICD-10-CM | POA: Diagnosis not present

## 2013-04-16 DIAGNOSIS — D631 Anemia in chronic kidney disease: Secondary | ICD-10-CM | POA: Diagnosis not present

## 2013-04-22 DIAGNOSIS — H4010X0 Unspecified open-angle glaucoma, stage unspecified: Secondary | ICD-10-CM | POA: Diagnosis not present

## 2013-05-09 DIAGNOSIS — E1129 Type 2 diabetes mellitus with other diabetic kidney complication: Secondary | ICD-10-CM | POA: Diagnosis not present

## 2013-05-09 DIAGNOSIS — I10 Essential (primary) hypertension: Secondary | ICD-10-CM | POA: Diagnosis not present

## 2013-05-09 DIAGNOSIS — N185 Chronic kidney disease, stage 5: Secondary | ICD-10-CM | POA: Diagnosis not present

## 2013-05-12 DIAGNOSIS — N2581 Secondary hyperparathyroidism of renal origin: Secondary | ICD-10-CM | POA: Diagnosis not present

## 2013-05-12 DIAGNOSIS — N185 Chronic kidney disease, stage 5: Secondary | ICD-10-CM | POA: Diagnosis not present

## 2013-05-12 DIAGNOSIS — I10 Essential (primary) hypertension: Secondary | ICD-10-CM | POA: Diagnosis not present

## 2013-07-07 DIAGNOSIS — I10 Essential (primary) hypertension: Secondary | ICD-10-CM | POA: Diagnosis not present

## 2013-07-07 DIAGNOSIS — E1129 Type 2 diabetes mellitus with other diabetic kidney complication: Secondary | ICD-10-CM | POA: Diagnosis not present

## 2013-07-07 DIAGNOSIS — N185 Chronic kidney disease, stage 5: Secondary | ICD-10-CM | POA: Diagnosis not present

## 2013-07-09 DIAGNOSIS — D631 Anemia in chronic kidney disease: Secondary | ICD-10-CM | POA: Diagnosis not present

## 2013-07-09 DIAGNOSIS — N039 Chronic nephritic syndrome with unspecified morphologic changes: Secondary | ICD-10-CM | POA: Diagnosis not present

## 2013-07-09 DIAGNOSIS — N185 Chronic kidney disease, stage 5: Secondary | ICD-10-CM | POA: Diagnosis not present

## 2013-07-09 DIAGNOSIS — I10 Essential (primary) hypertension: Secondary | ICD-10-CM | POA: Diagnosis not present

## 2013-07-28 DIAGNOSIS — I10 Essential (primary) hypertension: Secondary | ICD-10-CM | POA: Diagnosis not present

## 2013-07-28 DIAGNOSIS — N185 Chronic kidney disease, stage 5: Secondary | ICD-10-CM | POA: Diagnosis not present

## 2013-07-28 DIAGNOSIS — IMO0001 Reserved for inherently not codable concepts without codable children: Secondary | ICD-10-CM | POA: Diagnosis not present

## 2013-08-04 ENCOUNTER — Emergency Department: Payer: Self-pay | Admitting: Emergency Medicine

## 2013-08-04 DIAGNOSIS — N289 Disorder of kidney and ureter, unspecified: Secondary | ICD-10-CM | POA: Diagnosis not present

## 2013-08-04 DIAGNOSIS — T310 Burns involving less than 10% of body surface: Secondary | ICD-10-CM | POA: Diagnosis not present

## 2013-08-04 DIAGNOSIS — Z886 Allergy status to analgesic agent status: Secondary | ICD-10-CM | POA: Diagnosis not present

## 2013-08-04 DIAGNOSIS — T23209A Burn of second degree of unspecified hand, unspecified site, initial encounter: Secondary | ICD-10-CM | POA: Diagnosis not present

## 2013-08-04 DIAGNOSIS — I10 Essential (primary) hypertension: Secondary | ICD-10-CM | POA: Diagnosis not present

## 2013-08-04 DIAGNOSIS — Z882 Allergy status to sulfonamides status: Secondary | ICD-10-CM | POA: Diagnosis not present

## 2013-08-04 DIAGNOSIS — Z885 Allergy status to narcotic agent status: Secondary | ICD-10-CM | POA: Diagnosis not present

## 2013-08-04 DIAGNOSIS — Z87891 Personal history of nicotine dependence: Secondary | ICD-10-CM | POA: Diagnosis not present

## 2013-08-06 DIAGNOSIS — I10 Essential (primary) hypertension: Secondary | ICD-10-CM | POA: Diagnosis not present

## 2013-08-06 DIAGNOSIS — E1129 Type 2 diabetes mellitus with other diabetic kidney complication: Secondary | ICD-10-CM | POA: Diagnosis not present

## 2013-08-06 DIAGNOSIS — N185 Chronic kidney disease, stage 5: Secondary | ICD-10-CM | POA: Diagnosis not present

## 2013-08-08 DIAGNOSIS — E118 Type 2 diabetes mellitus with unspecified complications: Secondary | ICD-10-CM | POA: Diagnosis not present

## 2013-08-08 DIAGNOSIS — I10 Essential (primary) hypertension: Secondary | ICD-10-CM | POA: Diagnosis not present

## 2013-08-08 DIAGNOSIS — D631 Anemia in chronic kidney disease: Secondary | ICD-10-CM | POA: Diagnosis not present

## 2013-08-08 DIAGNOSIS — N185 Chronic kidney disease, stage 5: Secondary | ICD-10-CM | POA: Diagnosis not present

## 2013-09-12 DIAGNOSIS — N185 Chronic kidney disease, stage 5: Secondary | ICD-10-CM | POA: Diagnosis not present

## 2013-09-12 DIAGNOSIS — E1129 Type 2 diabetes mellitus with other diabetic kidney complication: Secondary | ICD-10-CM | POA: Diagnosis not present

## 2013-09-16 DIAGNOSIS — D631 Anemia in chronic kidney disease: Secondary | ICD-10-CM | POA: Diagnosis not present

## 2013-09-16 DIAGNOSIS — N185 Chronic kidney disease, stage 5: Secondary | ICD-10-CM | POA: Diagnosis not present

## 2013-09-16 DIAGNOSIS — E118 Type 2 diabetes mellitus with unspecified complications: Secondary | ICD-10-CM | POA: Diagnosis not present

## 2013-09-16 DIAGNOSIS — N039 Chronic nephritic syndrome with unspecified morphologic changes: Secondary | ICD-10-CM | POA: Diagnosis not present

## 2013-09-16 DIAGNOSIS — I10 Essential (primary) hypertension: Secondary | ICD-10-CM | POA: Diagnosis not present

## 2013-10-14 DIAGNOSIS — I10 Essential (primary) hypertension: Secondary | ICD-10-CM | POA: Diagnosis not present

## 2013-10-14 DIAGNOSIS — N185 Chronic kidney disease, stage 5: Secondary | ICD-10-CM | POA: Diagnosis not present

## 2013-10-14 DIAGNOSIS — E1129 Type 2 diabetes mellitus with other diabetic kidney complication: Secondary | ICD-10-CM | POA: Diagnosis not present

## 2013-10-15 LAB — CBC
HGB: 10 g/dL
WBC: 5.1
platelet count: 153

## 2013-10-15 LAB — HEMOGLOBIN A1C: A1C: 6.6

## 2013-10-15 LAB — BASIC METABOLIC PANEL
Creat: 4.11
Glucose: 141

## 2013-10-15 LAB — TSH: TSH: 2.53

## 2013-10-16 DIAGNOSIS — N185 Chronic kidney disease, stage 5: Secondary | ICD-10-CM | POA: Diagnosis not present

## 2013-10-16 DIAGNOSIS — N039 Chronic nephritic syndrome with unspecified morphologic changes: Secondary | ICD-10-CM | POA: Diagnosis not present

## 2013-10-16 DIAGNOSIS — D631 Anemia in chronic kidney disease: Secondary | ICD-10-CM | POA: Diagnosis not present

## 2013-10-16 DIAGNOSIS — E118 Type 2 diabetes mellitus with unspecified complications: Secondary | ICD-10-CM | POA: Diagnosis not present

## 2013-10-16 DIAGNOSIS — N2581 Secondary hyperparathyroidism of renal origin: Secondary | ICD-10-CM | POA: Diagnosis not present

## 2013-10-27 ENCOUNTER — Encounter: Payer: Self-pay | Admitting: *Deleted

## 2013-11-12 DIAGNOSIS — I10 Essential (primary) hypertension: Secondary | ICD-10-CM | POA: Diagnosis not present

## 2013-11-12 DIAGNOSIS — N183 Chronic kidney disease, stage 3 unspecified: Secondary | ICD-10-CM | POA: Diagnosis not present

## 2013-11-12 DIAGNOSIS — E119 Type 2 diabetes mellitus without complications: Secondary | ICD-10-CM | POA: Diagnosis not present

## 2013-11-13 ENCOUNTER — Encounter: Payer: Self-pay | Admitting: Family Medicine

## 2013-11-14 DIAGNOSIS — E872 Acidosis, unspecified: Secondary | ICD-10-CM | POA: Diagnosis not present

## 2013-11-14 DIAGNOSIS — I10 Essential (primary) hypertension: Secondary | ICD-10-CM | POA: Diagnosis not present

## 2013-11-14 DIAGNOSIS — E118 Type 2 diabetes mellitus with unspecified complications: Secondary | ICD-10-CM | POA: Diagnosis not present

## 2013-11-14 DIAGNOSIS — N185 Chronic kidney disease, stage 5: Secondary | ICD-10-CM | POA: Diagnosis not present

## 2013-12-09 ENCOUNTER — Other Ambulatory Visit: Payer: Self-pay | Admitting: Family Medicine

## 2013-12-18 ENCOUNTER — Other Ambulatory Visit: Payer: Self-pay | Admitting: Family Medicine

## 2013-12-19 ENCOUNTER — Other Ambulatory Visit: Payer: Self-pay | Admitting: Family Medicine

## 2013-12-30 ENCOUNTER — Other Ambulatory Visit: Payer: Self-pay | Admitting: Family Medicine

## 2014-01-08 ENCOUNTER — Encounter: Payer: Medicare Other | Admitting: Family Medicine

## 2014-01-12 ENCOUNTER — Telehealth: Payer: Self-pay | Admitting: Family Medicine

## 2014-01-12 NOTE — Telephone Encounter (Signed)
Diabetic Bundle.  Pt needs lab appt to check LDL.  Unable to leave message.

## 2014-01-16 DIAGNOSIS — I10 Essential (primary) hypertension: Secondary | ICD-10-CM | POA: Diagnosis not present

## 2014-01-16 DIAGNOSIS — E1129 Type 2 diabetes mellitus with other diabetic kidney complication: Secondary | ICD-10-CM | POA: Diagnosis not present

## 2014-01-16 DIAGNOSIS — N185 Chronic kidney disease, stage 5: Secondary | ICD-10-CM | POA: Diagnosis not present

## 2014-01-19 DIAGNOSIS — N185 Chronic kidney disease, stage 5: Secondary | ICD-10-CM | POA: Diagnosis not present

## 2014-01-19 DIAGNOSIS — D631 Anemia in chronic kidney disease: Secondary | ICD-10-CM | POA: Diagnosis not present

## 2014-01-19 DIAGNOSIS — E118 Type 2 diabetes mellitus with unspecified complications: Secondary | ICD-10-CM | POA: Diagnosis not present

## 2014-01-19 DIAGNOSIS — I10 Essential (primary) hypertension: Secondary | ICD-10-CM | POA: Diagnosis not present

## 2014-01-19 DIAGNOSIS — N039 Chronic nephritic syndrome with unspecified morphologic changes: Secondary | ICD-10-CM | POA: Diagnosis not present

## 2014-01-20 ENCOUNTER — Encounter: Payer: Self-pay | Admitting: Family Medicine

## 2014-01-31 DIAGNOSIS — I6529 Occlusion and stenosis of unspecified carotid artery: Secondary | ICD-10-CM

## 2014-01-31 HISTORY — DX: Occlusion and stenosis of unspecified carotid artery: I65.29

## 2014-02-02 ENCOUNTER — Other Ambulatory Visit: Payer: Self-pay | Admitting: Family Medicine

## 2014-02-10 ENCOUNTER — Telehealth: Payer: Self-pay | Admitting: *Deleted

## 2014-02-10 NOTE — Telephone Encounter (Signed)
Lmom to call our office for 1 yr f/u on carotid doppler

## 2014-02-12 DIAGNOSIS — H524 Presbyopia: Secondary | ICD-10-CM | POA: Diagnosis not present

## 2014-02-12 DIAGNOSIS — H4011X0 Primary open-angle glaucoma, stage unspecified: Secondary | ICD-10-CM | POA: Diagnosis not present

## 2014-02-12 DIAGNOSIS — E11329 Type 2 diabetes mellitus with mild nonproliferative diabetic retinopathy without macular edema: Secondary | ICD-10-CM | POA: Diagnosis not present

## 2014-02-17 ENCOUNTER — Other Ambulatory Visit (HOSPITAL_COMMUNITY): Payer: Self-pay | Admitting: *Deleted

## 2014-02-17 DIAGNOSIS — I6529 Occlusion and stenosis of unspecified carotid artery: Secondary | ICD-10-CM

## 2014-02-26 ENCOUNTER — Encounter (INDEPENDENT_AMBULATORY_CARE_PROVIDER_SITE_OTHER): Payer: Medicare Other

## 2014-02-26 DIAGNOSIS — E1129 Type 2 diabetes mellitus with other diabetic kidney complication: Secondary | ICD-10-CM | POA: Diagnosis not present

## 2014-02-26 DIAGNOSIS — I6529 Occlusion and stenosis of unspecified carotid artery: Secondary | ICD-10-CM | POA: Diagnosis not present

## 2014-02-26 DIAGNOSIS — N185 Chronic kidney disease, stage 5: Secondary | ICD-10-CM | POA: Diagnosis not present

## 2014-02-26 DIAGNOSIS — I10 Essential (primary) hypertension: Secondary | ICD-10-CM | POA: Diagnosis not present

## 2014-02-26 LAB — COMPREHENSIVE METABOLIC PANEL
Creat: 4.05
GLUCOSE: 126
POTASSIUM: 4.3 mmol/L

## 2014-02-26 LAB — CBC
HEMOGLOBIN: 10.6 g/dL
WBC: 5.6
platelet count: 149

## 2014-02-26 LAB — PTH, INTACT: PTH INTERP: 135

## 2014-02-27 ENCOUNTER — Encounter: Payer: Self-pay | Admitting: Family Medicine

## 2014-02-27 ENCOUNTER — Encounter: Payer: Self-pay | Admitting: *Deleted

## 2014-03-03 DIAGNOSIS — H409 Unspecified glaucoma: Secondary | ICD-10-CM

## 2014-03-03 DIAGNOSIS — E11311 Type 2 diabetes mellitus with unspecified diabetic retinopathy with macular edema: Secondary | ICD-10-CM | POA: Insufficient documentation

## 2014-03-03 HISTORY — DX: Unspecified glaucoma: H40.9

## 2014-03-03 HISTORY — DX: Type 2 diabetes mellitus with unspecified diabetic retinopathy with macular edema: E11.311

## 2014-03-13 ENCOUNTER — Encounter: Payer: Self-pay | Admitting: Family Medicine

## 2014-03-13 ENCOUNTER — Ambulatory Visit (INDEPENDENT_AMBULATORY_CARE_PROVIDER_SITE_OTHER)
Admission: RE | Admit: 2014-03-13 | Discharge: 2014-03-13 | Disposition: A | Discharge status: Home / Self Care | Payer: Medicare Other | Source: Ambulatory Visit | Attending: Family Medicine | Admitting: Family Medicine

## 2014-03-13 ENCOUNTER — Ambulatory Visit (INDEPENDENT_AMBULATORY_CARE_PROVIDER_SITE_OTHER): Payer: Medicare Other | Admitting: Family Medicine

## 2014-03-13 VITALS — BP 168/94 | HR 64 | Temp 97.8°F | Ht 63.5 in | Wt 237.8 lb

## 2014-03-13 DIAGNOSIS — E1129 Type 2 diabetes mellitus with other diabetic kidney complication: Secondary | ICD-10-CM

## 2014-03-13 DIAGNOSIS — Z85038 Personal history of other malignant neoplasm of large intestine: Secondary | ICD-10-CM

## 2014-03-13 DIAGNOSIS — M25552 Pain in left hip: Secondary | ICD-10-CM

## 2014-03-13 DIAGNOSIS — Z0000 Encounter for general adult medical examination without abnormal findings: Secondary | ICD-10-CM | POA: Diagnosis not present

## 2014-03-13 DIAGNOSIS — E11339 Type 2 diabetes mellitus with moderate nonproliferative diabetic retinopathy without macular edema: Secondary | ICD-10-CM

## 2014-03-13 DIAGNOSIS — M879 Osteonecrosis, unspecified: Secondary | ICD-10-CM

## 2014-03-13 DIAGNOSIS — M8700 Idiopathic aseptic necrosis of unspecified bone: Secondary | ICD-10-CM

## 2014-03-13 DIAGNOSIS — M545 Low back pain, unspecified: Secondary | ICD-10-CM

## 2014-03-13 DIAGNOSIS — I10 Essential (primary) hypertension: Secondary | ICD-10-CM

## 2014-03-13 DIAGNOSIS — E538 Deficiency of other specified B group vitamins: Secondary | ICD-10-CM | POA: Diagnosis not present

## 2014-03-13 DIAGNOSIS — E890 Postprocedural hypothyroidism: Secondary | ICD-10-CM

## 2014-03-13 DIAGNOSIS — Z23 Encounter for immunization: Secondary | ICD-10-CM | POA: Diagnosis not present

## 2014-03-13 DIAGNOSIS — M25559 Pain in unspecified hip: Secondary | ICD-10-CM

## 2014-03-13 DIAGNOSIS — E1139 Type 2 diabetes mellitus with other diabetic ophthalmic complication: Secondary | ICD-10-CM | POA: Diagnosis not present

## 2014-03-13 DIAGNOSIS — H3530 Unspecified macular degeneration: Secondary | ICD-10-CM

## 2014-03-13 DIAGNOSIS — I6529 Occlusion and stenosis of unspecified carotid artery: Secondary | ICD-10-CM | POA: Diagnosis not present

## 2014-03-13 DIAGNOSIS — E11311 Type 2 diabetes mellitus with unspecified diabetic retinopathy with macular edema: Secondary | ICD-10-CM

## 2014-03-13 DIAGNOSIS — N185 Chronic kidney disease, stage 5: Secondary | ICD-10-CM

## 2014-03-13 DIAGNOSIS — E113319 Type 2 diabetes mellitus with moderate nonproliferative diabetic retinopathy with macular edema, unspecified eye: Secondary | ICD-10-CM

## 2014-03-13 DIAGNOSIS — E1165 Type 2 diabetes mellitus with hyperglycemia: Secondary | ICD-10-CM

## 2014-03-13 DIAGNOSIS — M47817 Spondylosis without myelopathy or radiculopathy, lumbosacral region: Secondary | ICD-10-CM | POA: Diagnosis not present

## 2014-03-13 DIAGNOSIS — Z853 Personal history of malignant neoplasm of breast: Secondary | ICD-10-CM

## 2014-03-13 DIAGNOSIS — M25551 Pain in right hip: Secondary | ICD-10-CM

## 2014-03-13 DIAGNOSIS — M5137 Other intervertebral disc degeneration, lumbosacral region: Secondary | ICD-10-CM | POA: Diagnosis not present

## 2014-03-13 DIAGNOSIS — M5440 Lumbago with sciatica, unspecified side: Secondary | ICD-10-CM

## 2014-03-13 DIAGNOSIS — M5430 Sciatica, unspecified side: Secondary | ICD-10-CM

## 2014-03-13 MED ORDER — METHOCARBAMOL 500 MG PO TABS: 500.0000 mg | ORAL_TABLET | Freq: Two times a day (BID) | ORAL | Status: DC | PRN

## 2014-03-13 MED ORDER — ACURA BLOOD GLUCOSE METER W/DEVICE KIT: PACK | Status: DC

## 2014-03-13 NOTE — Patient Instructions (Addendum)
Flu and prevnar shots today. Go to see doctors at [facility]. Call to establish with local OBGYN. Call to schedule mammogram at [facility]. We will refer you to stomach doctor to discuss colonoscopy [facility]). xrays of lower back today. Return to see me in 1 month for follow up. You have bilateral hip bursitis - try exercises provided today.

## 2014-03-13 NOTE — Progress Notes (Signed)
BP 168/94  Pulse 64  Temp(Src) 97.8 F (36.6 C) (Oral)  Ht 5' 3.5" (1.613 m)  Wt 237 lb 12 oz (107.843 kg)  BMI 41.45 kg/m2   CC: medicare wellness visit  Subjective:    Patient ID: Alexis Tucker, female    DOB: 10/16/1940, 73 y.o.   MRN: [id number]  HPI: Alexis Tucker is a 73 y.o. female presenting on 03/13/2014 for Annual Exam   End stage renal disease - sees nephrologist Dr. Candiss Norse once monthly. Working on AV graft placement L arm with [facility] ([facility]ne[facility]).   BP very elevated - did not take meds this morning, "was too busy"  Worsening joint pains - back and hips. H/o ?osteonecrosis.   Requests B12 shot today - states previously received this.   Recently saw eye doctor. Known R eye trouble - states macular edema and. Saw Dr. Rick Duff - referred to retina doctor. Worried eye sight has gotten worse. Would like to see new eye doctor.  Passes hearing screen today.  No falls in last year. Denies depression/anhedonia, sadness.   Preventative: Well woman was 2008 (OBGYN at [facility]). Looking to establish with local OBGYN.  COLONOSCOPY Date: 2008 ext hem, ileo-colonic anastomosis in cecum, tubular adenoma x1, rec rpt 1 yr for multiple polyps (done in [place]). H/o colon cancer 2005 s/p colectomy Mammogram 01/2013 normal [facility]. H/o breast cancer 2008, currently on arimidex. Unclear when to stop. Dexa scan - has had, thinks about 10 yrs ago.  Pneumovax 2011, prevnar today Flu shot - today Tetanus - unsure, thinks within the past year.  Shingles shot - would like to receive at [facility] but has not received yet.  Advanced directives/living will - has been discussing with daughter. Working on setting this up.  Lives alone, 1 dog. Granddaughter in [place].  Divorced  Occu: retired, worked in Engineer, technical sales  Edu: college  Relevant past medical, surgical, family and social history reviewed and updated as indicated.  Allergies and medications reviewed and updated. Current Outpatient Prescriptions on  File Prior to Visit  Medication Sig  . Acetylcysteine (N-ACETYL-L-CYSTEINE) 600 MG CAPS Take 1 capsule by mouth daily.  . Alpha-Lipoic Acid 100 MG CAPS Take 1 capsule by mouth daily.  . Cholecalciferol (VITAMIN D-3) 5000 UNITS TABS Take 1 tablet by mouth daily.  . Coenzyme Q10 75 MG CAPS Take 1 capsule by mouth daily.  . Cyanocobalamin (B-12) 2500 MCG SUBL Place 1 tablet under the tongue daily.  . folic acid (FOLVITE) 829 MCG tablet Take 400 mcg by mouth daily.  . LevOCARNitine L-Tartrate (L-CARNITINE) 500 MG CAPS Take 1 capsule by mouth daily.  . NON FORMULARY Chinese chlorella $RemoveBeforeDEI'500mg'lCTUhsnbAKKXjiKc$   . NON FORMULARY Immune essentials, phosphatidylcholine, iodoral, ultimate flora probiotic  . Omega Fatty Acids-Phytosterols (PA FISH OIL/PHYTOSTEROLS) 450-400 MG CAPS Take 1 tablet by mouth daily.  Marland Kitchen Raspberry Ketones 100 MG CAPS Take 1 capsule by mouth daily.  Marland Kitchen selenium 50 MCG TABS Take 50 mcg by mouth daily.   No current facility-administered medications on file prior to visit.    Review of Systems Per HPI unless specifically indicated above    Objective:    BP 168/94  Pulse 64  Temp(Src) 97.8 F (36.6 C) (Oral)  Ht 5' 3.5" (1.613 m)  Wt 237 lb 12 oz (107.843 kg)  BMI 41.45 kg/m2  Physical Exam  Nursing note and vitals reviewed. Constitutional: She is oriented to person, place, and time. She appears well-developed and well-nourished. No distress.  Morbidly obese  HENT:  Head: Normocephalic and atraumatic.  Right Ear: Hearing, tympanic membrane, external ear and ear canal normal.  Left Ear: Hearing, tympanic membrane, external ear and ear canal normal.  Nose: Nose normal.  Mouth/Throat: Uvula is midline, oropharynx is clear and moist and mucous membranes are normal. No oropharyngeal exudate, posterior oropharyngeal edema or posterior oropharyngeal erythema.  Eyes: Conjunctivae and EOM are normal. Pupils are equal, round, and reactive to light. No scleral icterus.  Neck: Normal range of  motion. Neck supple. Carotid bruit is not present. No thyromegaly present.  Cardiovascular: Normal rate, regular rhythm, normal heart sounds and intact distal pulses.   No murmur heard. Pulses:      Radial pulses are 2+ on the right side, and 2+ on the left side.  Pulmonary/Chest: Effort normal and breath sounds normal. No respiratory distress. She has no wheezes. She has no rales.  Breast exam: deferred, upcoming mammogram  Abdominal: Soft. Bowel sounds are normal. She exhibits no distension and no mass. There is no tenderness. There is no rebound and no guarding.  Genitourinary:  GYN deferred, s/p hysterectomy  Musculoskeletal: Normal range of motion. She exhibits no edema.  Midline lumbar spine tenderness, paraspinous mm tenderness as well. Tender to palpation at bilateral GTB  Lymphadenopathy:    She has no cervical adenopathy.  Neurological: She is alert and oriented to person, place, and time.  CN grossly intact, station and gait intact Recall 3/3  Calculation 5/5 serial 3s Antalgic gait from hip and back pain./  Skin: Skin is warm and dry. No rash noted.  Psychiatric: She has a normal mood and affect. Her behavior is normal. Judgment and thought content normal.   Results for orders placed in visit on 03/13/14  LDL CHOLESTEROL, DIRECT      Result Value Ref Range   Direct LDL 108 (*)   VITAMIN B12      Result Value Ref Range   Vitamin B-12 1079 (*) 211 - 911 pg/mL      Assessment & Plan:   Problem List Items Addressed This Visit   Type II or unspecified type diabetes mellitus with renal manifestations, uncontrolled(250.42)     actually well controlled only on onglyza. Continue. Last A1c 6.6%.    Relevant Medications      Blood Glucose Monitoring Suppl (ACURA BLOOD GLUCOSE METER) W/DEVICE KIT      glimepiride (AMARYL) tablet      saxagliptin (ONGLYZA) tablet   Other Relevant Orders      LDL cholesterol, direct (Completed)   Post-surgical hypothyroidism      Lab  Results  Component Value Date   TSH 2.530 10/15/2013  chronic, stable. Continue regimen. S/p post surgical hypothyroidism - due to "hemorrhage".    Relevant Medications      carvedilol (COREG) tablet      thyroid (ARMOUR) tablet   Osteonecrosis     Unclear story but today does not describe hip joint pain. Never on bisphosphonate    Medicare annual wellness visit, subsequent - Primary     I have personally reviewed the Medicare Annual Wellness questionnaire and have noted 1. The patient's medical and social history 2. Their use of alcohol, tobacco or illicit drugs 3. Their current medications and supplements 4. The patient's functional ability including ADL's, fall risks, home safety risks and hearing or visual impairment. 5. Diet and physical activity 6. Evidence for depression or mood disorders The patients weight, height, BMI have been recorded in the chart.  Hearing and vision has been addressed.  I have made referrals, counseling and provided education to the patient based review of the above and I have provided the pt with a written personalized care plan for preventive services. Provider list updated - see scanned questionairre. Advanced directives discussed:  Reviewed preventative protocols and updated unless pt declined.    Macular degeneration     Will refer to [facility] per pt request    Lower back pain     Anticipate significant DDD contributing, but given degree of pain and comorbidities, will check lumbar films today to r/o compression fx. Prescribe robaxin prn back pain. Avoid NSAIDs, intolerant to tramadol.    Relevant Medications      methocarbamol (ROBAXIN) tablet   Other Relevant Orders      DG Lumbar Spine Complete (Completed)   HTN (hypertension)     bp very elevated today but pt did not take her medications. Advised be more regular with med administration.    Relevant Medications      carvedilol (COREG) tablet      furosemide (LASIX) tablet       hydrALAZINE (APRESOLINE) tablet      isosorbide dinitrate (ISORDIL) tablet   History of colon cancer     Due for colonoscopy - will refer today.    Relevant Orders      Ambulatory referral to Gastroenterology   History of breast cancer     Will be due for mammogram in next few months - pt will call to schedule at [facility].    Hip pain, bilateral     Actually today describes bilateral hip bursitis not actual hip joint etiology Discussed this, discussed possible steroid injections. Will start with exercises for trochanteric bursitis and continue voltaren gel.    Diabetic retinopathy     With macular degeneration - pt not happy with current care received at [facility] - requests referral to [facility] to establish care there.    Relevant Medications      glimepiride (AMARYL) tablet      saxagliptin (ONGLYZA) tablet   Other Relevant Orders      Ambulatory referral to Ophthalmology   Chronic kidney disease, stage V     Discussing AVG an HD. Has nephrologist and vascular surgeon. Appreciate their care. No uremic sxs or other indication for urgent dialysis need.    Carotid stenosis, asymptomatic     Severe ECA stenosis but ICAs stable.    Relevant Medications      carvedilol (COREG) tablet      furosemide (LASIX) tablet      hydrALAZINE (APRESOLINE) tablet      isosorbide dinitrate (ISORDIL) tablet    Other Visit Diagnoses   Immunization due        Relevant Orders       Pneumococcal conjugate vaccine 13-valent (Completed)    Need for prophylactic vaccination and inoculation against influenza        Relevant Orders       Flu Vaccine QUAD 36+ mos PF IM (Fluarix Quad PF) (Completed)    B12 deficiency        Relevant Orders       Vitamin B12 (Completed)        Follow up plan: Return in about 1 month (around 04/12/2014), or as needed, for follow up visit.

## 2014-03-13 NOTE — Progress Notes (Signed)
Pre visit review using our clinic review tool, if applicable. No additional management support is needed unless otherwise documented below in the visit note. 

## 2014-03-14 ENCOUNTER — Encounter: Payer: Self-pay | Admitting: Family Medicine

## 2014-03-14 DIAGNOSIS — M545 Low back pain, unspecified: Secondary | ICD-10-CM | POA: Insufficient documentation

## 2014-03-14 DIAGNOSIS — M25551 Pain in right hip: Secondary | ICD-10-CM | POA: Insufficient documentation

## 2014-03-14 DIAGNOSIS — H3530 Unspecified macular degeneration: Secondary | ICD-10-CM | POA: Insufficient documentation

## 2014-03-14 DIAGNOSIS — E538 Deficiency of other specified B group vitamins: Secondary | ICD-10-CM | POA: Insufficient documentation

## 2014-03-14 DIAGNOSIS — M25552 Pain in left hip: Secondary | ICD-10-CM

## 2014-03-14 LAB — VITAMIN B12: Vitamin B-12: 1079 pg/mL — ABNORMAL HIGH (ref 211–911)

## 2014-03-14 LAB — LDL CHOLESTEROL, DIRECT: LDL DIRECT: 108 mg/dL — AB

## 2014-03-14 MED ORDER — DICLOFENAC SODIUM 1 % TD GEL: 1.0000 "application " | Freq: Three times a day (TID) | TRANSDERMAL | Status: DC

## 2014-03-14 MED ORDER — CARVEDILOL 25 MG PO TABS: ORAL_TABLET | ORAL | Status: DC

## 2014-03-14 MED ORDER — ALLOPURINOL 100 MG PO TABS: ORAL_TABLET | ORAL | Status: DC

## 2014-03-14 MED ORDER — GLIMEPIRIDE 1 MG PO TABS: ORAL_TABLET | ORAL | Status: DC

## 2014-03-14 MED ORDER — HYDRALAZINE HCL 50 MG PO TABS: 50.0000 mg | ORAL_TABLET | Freq: Every day | ORAL | Status: DC

## 2014-03-14 MED ORDER — SAXAGLIPTIN HCL 5 MG PO TABS: ORAL_TABLET | ORAL | Status: DC

## 2014-03-14 MED ORDER — ISOSORBIDE DINITRATE 20 MG PO TABS: ORAL_TABLET | ORAL | Status: DC

## 2014-03-14 MED ORDER — FUROSEMIDE 40 MG PO TABS: 40.0000 mg | ORAL_TABLET | Freq: Every day | ORAL | Status: DC

## 2014-03-14 MED ORDER — PENTOXIFYLLINE ER 400 MG PO TBCR: EXTENDED_RELEASE_TABLET | ORAL | Status: DC

## 2014-03-14 MED ORDER — ANASTROZOLE 1 MG PO TABS: ORAL_TABLET | ORAL | Status: DC

## 2014-03-14 MED ORDER — THYROID 60 MG PO TABS: ORAL_TABLET | ORAL | Status: DC

## 2014-03-14 NOTE — Assessment & Plan Note (Signed)
Lab Results  Component Value Date   TSH 2.530 10/15/2013  chronic, stable. Continue regimen. S/p post surgical hypothyroidism - due to "hemorrhage".

## 2014-03-14 NOTE — Assessment & Plan Note (Signed)
Check b12 level today to see if pt needs IM. For now continue SL administration.

## 2014-03-14 NOTE — Assessment & Plan Note (Signed)
Discussing AVG an HD. Has nephrologist and vascular surgeon. Appreciate their care. No uremic sxs or other indication for urgent dialysis need.

## 2014-03-14 NOTE — Assessment & Plan Note (Signed)
Will be due for mammogram in next few months - pt will call to schedule at [facility] breast center.

## 2014-03-14 NOTE — Assessment & Plan Note (Signed)
Actually today describes bilateral hip bursitis not actual hip joint etiology Discussed this, discussed possible steroid injections. Will start with exercises for trochanteric bursitis and continue voltaren gel.

## 2014-03-14 NOTE — Assessment & Plan Note (Signed)
Severe ECA stenosis but ICAs stable.

## 2014-03-14 NOTE — Assessment & Plan Note (Signed)
Unclear story but today does not describe hip joint pain. Never on bisphosphonate

## 2014-03-14 NOTE — Assessment & Plan Note (Signed)
Will refer to [facility] per pt request

## 2014-03-14 NOTE — Assessment & Plan Note (Signed)
Due for colonoscopy - will refer today.

## 2014-03-14 NOTE — Assessment & Plan Note (Signed)
bp very elevated today but pt did not take her medications. Advised be more regular with med administration.

## 2014-03-14 NOTE — Assessment & Plan Note (Signed)
With macular degeneration - pt not happy with current care received at [facility] - requests referral to [facility] to establish care there.

## 2014-03-14 NOTE — Assessment & Plan Note (Signed)
actually well controlled only on onglyza. Continue. Last A1c 6.6%.

## 2014-03-14 NOTE — Assessment & Plan Note (Addendum)
Anticipate significant DDD contributing, but given degree of pain and comorbidities, will check lumbar films today to r/o compression fx. Prescribe robaxin prn back pain. Avoid NSAIDs, intolerant to tramadol.

## 2014-03-14 NOTE — Assessment & Plan Note (Signed)
I have personally reviewed the Medicare Annual Wellness questionnaire and have noted 1. The patient's medical and social history 2. Their use of alcohol, tobacco or illicit drugs 3. Their current medications and supplements 4. The patient's functional ability including ADL's, fall risks, home safety risks and hearing or visual impairment. 5. Diet and physical activity 6. Evidence for depression or mood disorders The patients weight, height, BMI have been recorded in the chart.  Hearing and vision has been addressed. I have made referrals, counseling and provided education to the patient based review of the above and I have provided the pt with a written personalized care plan for preventive services. Provider list updated - see scanned questionairre. Advanced directives discussed:  Reviewed preventative protocols and updated unless pt declined.

## 2014-03-25 DIAGNOSIS — E11311 Type 2 diabetes mellitus with unspecified diabetic retinopathy with macular edema: Secondary | ICD-10-CM | POA: Diagnosis not present

## 2014-03-25 DIAGNOSIS — E11319 Type 2 diabetes mellitus with unspecified diabetic retinopathy without macular edema: Secondary | ICD-10-CM | POA: Diagnosis not present

## 2014-03-25 DIAGNOSIS — IMO0001 Reserved for inherently not codable concepts without codable children: Secondary | ICD-10-CM | POA: Diagnosis not present

## 2014-03-30 LAB — HM DIABETES EYE EXAM

## 2014-04-08 ENCOUNTER — Encounter: Payer: Self-pay | Admitting: *Deleted

## 2014-04-11 ENCOUNTER — Other Ambulatory Visit: Payer: Self-pay | Admitting: Family Medicine

## 2014-04-11 ENCOUNTER — Encounter: Payer: Self-pay | Admitting: Family Medicine

## 2014-04-13 ENCOUNTER — Ambulatory Visit: Payer: Medicare Other | Admitting: Family Medicine

## 2014-04-13 DIAGNOSIS — Z0289 Encounter for other administrative examinations: Secondary | ICD-10-CM

## 2014-04-14 ENCOUNTER — Telehealth: Payer: Self-pay | Admitting: Family Medicine

## 2014-04-14 NOTE — Telephone Encounter (Signed)
Patient did not come for their scheduled appointment 04/13/14 for 1 month follow up  Please let me know if the patient needs to be contacted immediately for follow up or if no follow up is necessary.

## 2014-04-15 NOTE — Telephone Encounter (Signed)
Left message asking pt to call office please r/s below appointment

## 2014-04-15 NOTE — Telephone Encounter (Signed)
plz call to schedule f/u appt in the next month.

## 2014-04-20 DIAGNOSIS — E1129 Type 2 diabetes mellitus with other diabetic kidney complication: Secondary | ICD-10-CM | POA: Diagnosis not present

## 2014-04-20 DIAGNOSIS — I10 Essential (primary) hypertension: Secondary | ICD-10-CM | POA: Diagnosis not present

## 2014-04-20 DIAGNOSIS — N185 Chronic kidney disease, stage 5: Secondary | ICD-10-CM | POA: Diagnosis not present

## 2014-04-21 NOTE — Telephone Encounter (Signed)
Left message asking pt to call office  °

## 2014-04-27 DIAGNOSIS — I129 Hypertensive chronic kidney disease with stage 1 through stage 4 chronic kidney disease, or unspecified chronic kidney disease: Secondary | ICD-10-CM | POA: Diagnosis not present

## 2014-04-27 DIAGNOSIS — E1122 Type 2 diabetes mellitus with diabetic chronic kidney disease: Secondary | ICD-10-CM | POA: Diagnosis not present

## 2014-04-27 DIAGNOSIS — E1129 Type 2 diabetes mellitus with other diabetic kidney complication: Secondary | ICD-10-CM | POA: Diagnosis not present

## 2014-04-27 DIAGNOSIS — N2581 Secondary hyperparathyroidism of renal origin: Secondary | ICD-10-CM | POA: Diagnosis not present

## 2014-04-27 DIAGNOSIS — N185 Chronic kidney disease, stage 5: Secondary | ICD-10-CM | POA: Diagnosis not present

## 2014-04-28 NOTE — Telephone Encounter (Signed)
Left message asking pt to call office  °

## 2014-04-29 ENCOUNTER — Encounter: Payer: Self-pay | Admitting: Family Medicine

## 2014-04-29 NOTE — Telephone Encounter (Signed)
Mailed letter °

## 2014-05-13 DIAGNOSIS — H40003 Preglaucoma, unspecified, bilateral: Secondary | ICD-10-CM | POA: Diagnosis not present

## 2014-05-20 DIAGNOSIS — E11331 Type 2 diabetes mellitus with moderate nonproliferative diabetic retinopathy with macular edema: Secondary | ICD-10-CM | POA: Diagnosis not present

## 2014-06-24 DIAGNOSIS — E11331 Type 2 diabetes mellitus with moderate nonproliferative diabetic retinopathy with macular edema: Secondary | ICD-10-CM | POA: Diagnosis not present

## 2014-07-01 DIAGNOSIS — E11331 Type 2 diabetes mellitus with moderate nonproliferative diabetic retinopathy with macular edema: Secondary | ICD-10-CM | POA: Diagnosis not present

## 2014-09-14 ENCOUNTER — Other Ambulatory Visit: Payer: Self-pay | Admitting: Family Medicine

## 2014-09-15 NOTE — Telephone Encounter (Signed)
Ok to refill 

## 2014-10-05 DIAGNOSIS — E11331 Type 2 diabetes mellitus with moderate nonproliferative diabetic retinopathy with macular edema: Secondary | ICD-10-CM | POA: Diagnosis not present

## 2014-10-14 DIAGNOSIS — E11311 Type 2 diabetes mellitus with unspecified diabetic retinopathy with macular edema: Secondary | ICD-10-CM | POA: Diagnosis not present

## 2014-11-17 ENCOUNTER — Other Ambulatory Visit: Payer: Self-pay | Admitting: Family Medicine

## 2014-12-16 DIAGNOSIS — E11331 Type 2 diabetes mellitus with moderate nonproliferative diabetic retinopathy with macular edema: Secondary | ICD-10-CM | POA: Diagnosis not present

## 2014-12-21 ENCOUNTER — Telehealth: Payer: Self-pay

## 2014-12-21 NOTE — Telephone Encounter (Signed)
Diabetic Bundle. Attempted to contact pt. Phone number would not connect.

## 2015-02-02 ENCOUNTER — Telehealth: Payer: Self-pay | Admitting: *Deleted

## 2015-02-02 NOTE — Telephone Encounter (Signed)
PA required for Voltaren Gel. Submitted through Covermymeds. Will await determination.

## 2015-02-05 NOTE — Telephone Encounter (Signed)
Additional info required. In your IN box.

## 2015-02-06 NOTE — Telephone Encounter (Signed)
Filled and in Kim's box. 

## 2015-02-11 NOTE — Telephone Encounter (Signed)
PA faxed. Will await determination. 

## 2015-02-12 DIAGNOSIS — E11331 Type 2 diabetes mellitus with moderate nonproliferative diabetic retinopathy with macular edema: Secondary | ICD-10-CM | POA: Diagnosis not present

## 2015-02-15 NOTE — Telephone Encounter (Addendum)
[name] with [employer] in medication appeal left v/m requesting urgent cb today 02/15/15 about a question for med appeal for diclofenac gel.  If wait until 02/16/15 appeal could be denied. [name] will also fax request.

## 2015-02-15 NOTE — Telephone Encounter (Signed)
Message left in [place] for a return call. Unable to leave message for [name]. His VM was full.

## 2015-02-15 NOTE — Telephone Encounter (Signed)
Form was completed and faxed to [facility].

## 2015-02-17 DIAGNOSIS — E11331 Type 2 diabetes mellitus with moderate nonproliferative diabetic retinopathy with macular edema: Secondary | ICD-10-CM | POA: Diagnosis not present

## 2015-02-23 NOTE — Telephone Encounter (Signed)
PA approved. Pharmacy notified 

## 2015-03-14 ENCOUNTER — Other Ambulatory Visit: Payer: Self-pay | Admitting: Family Medicine

## 2015-03-31 ENCOUNTER — Other Ambulatory Visit: Payer: Self-pay | Admitting: Family Medicine

## 2015-04-05 IMAGING — CR DG LUMBAR SPINE COMPLETE 4+V
5 series · 5 of 5 positions shown · non-contrast
Comparison: None.

CLINICAL DATA: Low back pain for 2 years.

EXAM:
LUMBAR SPINE - COMPLETE 4+ VIEW

[view not recorded (1 of 5)]
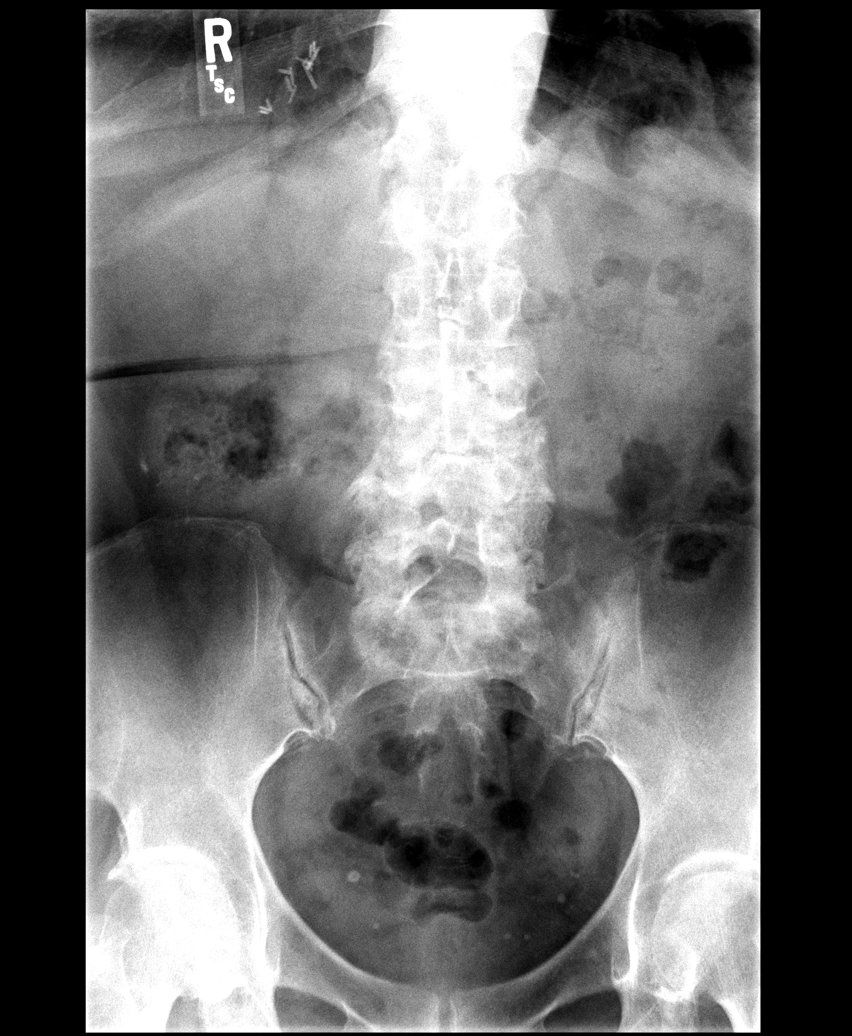

[view not recorded (2 of 5)]
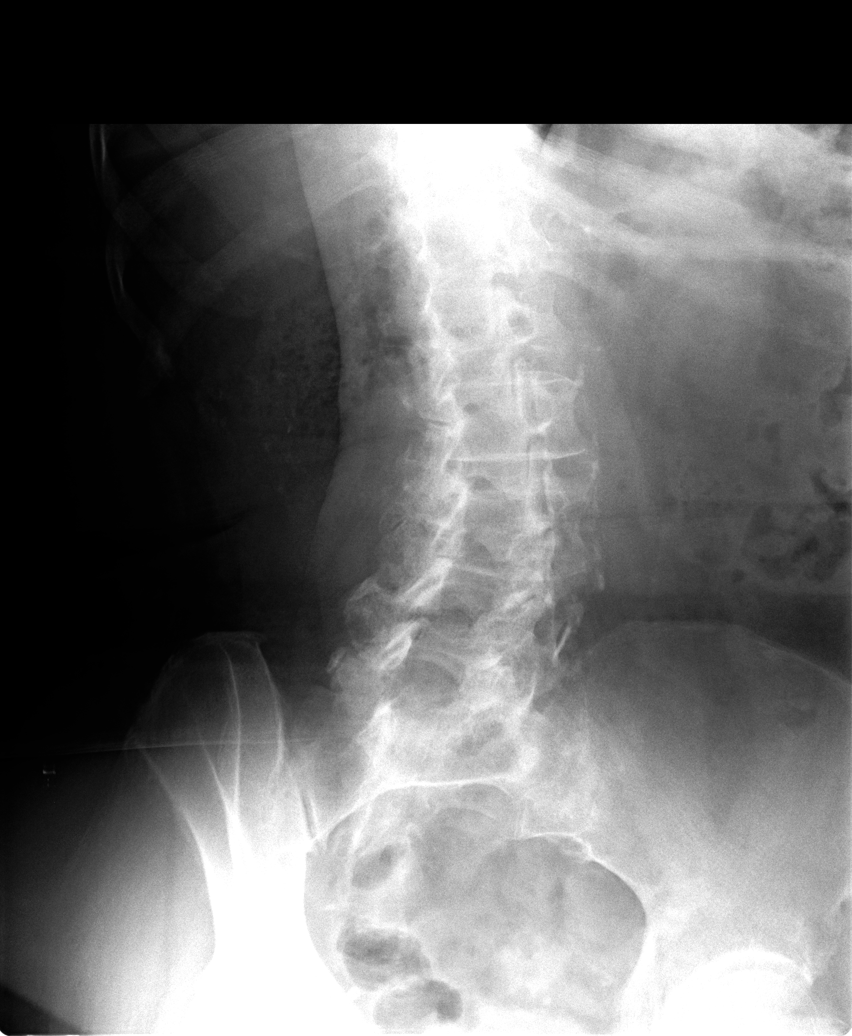

[view not recorded (3 of 5)]
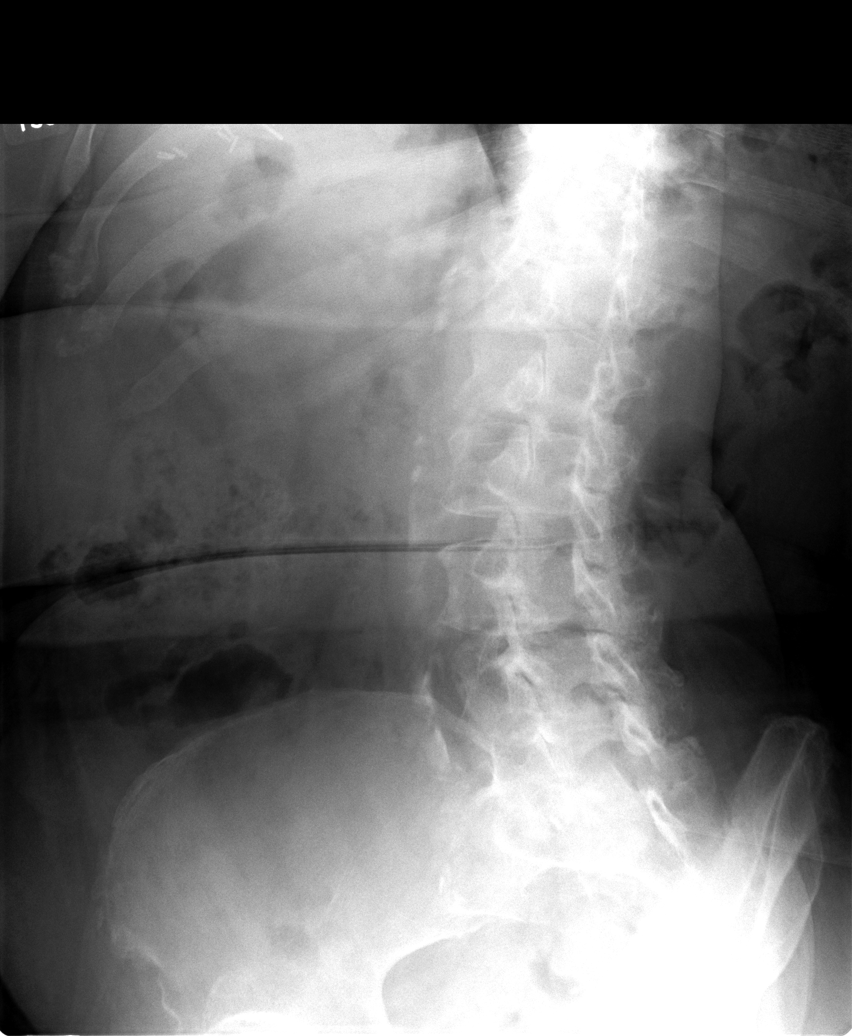

[view not recorded (4 of 5)]
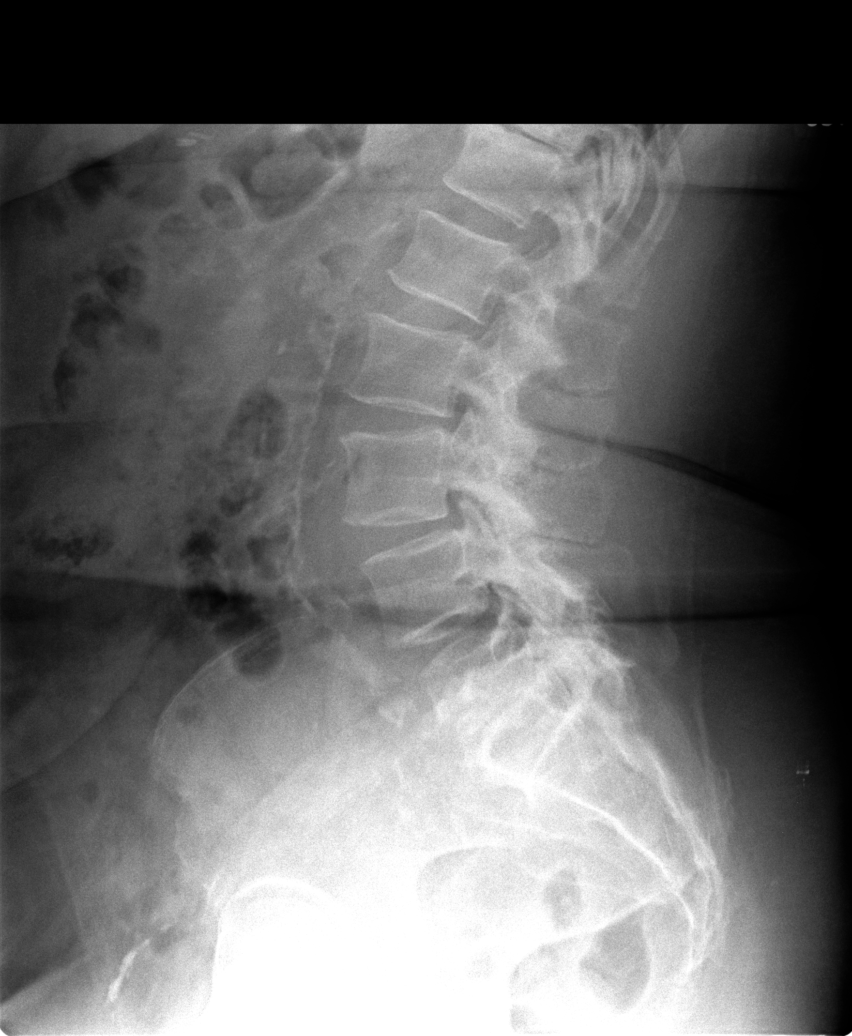

[view not recorded (5 of 5)]
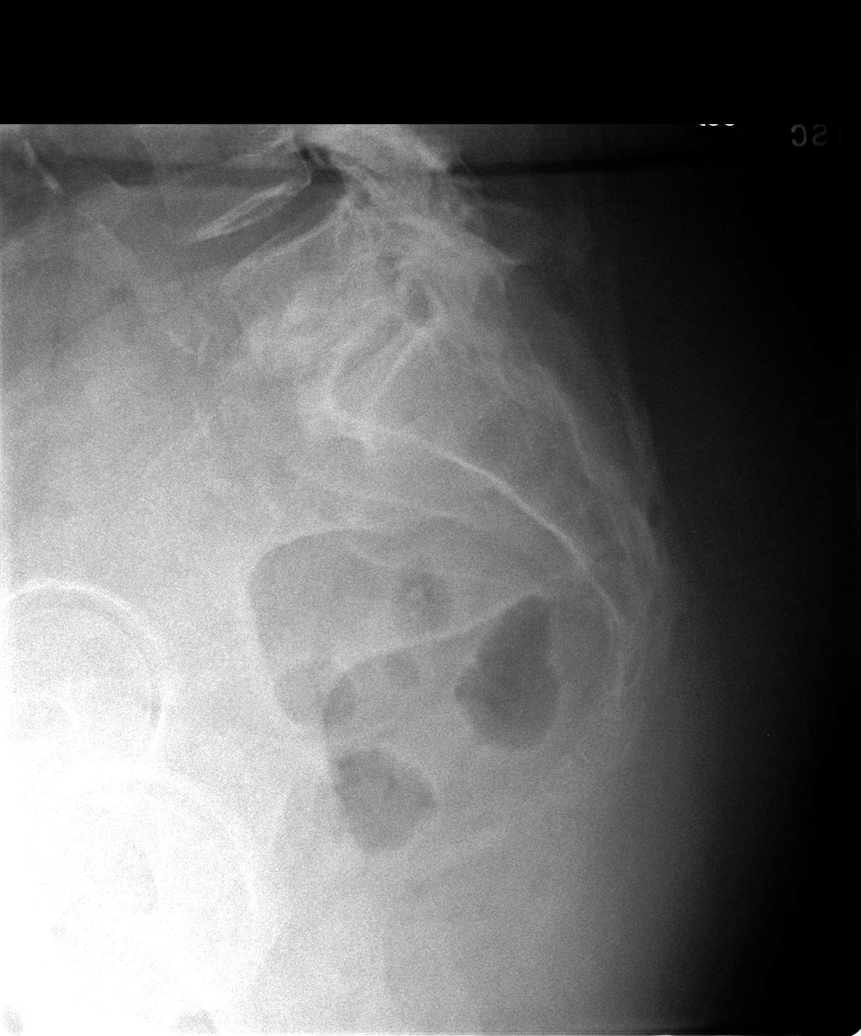

[5 of 5 positions shown; findings below may reference images not displayed]

FINDINGS: Axial loss of articular space in both hips.

Aortoiliac atherosclerotic vascular disease. Considerable
degenerative facet arthropathy is present on the left at all levels
between L2 and S1, and on the right at L3-4, L4-5, and L5-S1. There
is 5 mm of degenerative anterolisthesis at L4-5 and 3 mm of
anterolisthesis at L3-4. Mildly exaggerated lumbar lordosis.

Loss of intervertebral disc height at L5-S1 and at T10-11 and
T11-12.
IMPRESSION: 1. Lumbar spondylosis and degenerative disc disease. The grade 1
subluxations at L3-4 and L4-5 are believed to likely be degenerative
in nature.
2. Degenerative axial loss of articular space in both hips.

## 2015-04-12 DIAGNOSIS — E113312 Type 2 diabetes mellitus with moderate nonproliferative diabetic retinopathy with macular edema, left eye: Secondary | ICD-10-CM | POA: Diagnosis not present

## 2015-04-14 DIAGNOSIS — E113311 Type 2 diabetes mellitus with moderate nonproliferative diabetic retinopathy with macular edema, right eye: Secondary | ICD-10-CM | POA: Diagnosis not present

## 2015-05-12 DIAGNOSIS — E113311 Type 2 diabetes mellitus with moderate nonproliferative diabetic retinopathy with macular edema, right eye: Secondary | ICD-10-CM | POA: Diagnosis not present

## 2015-05-12 DIAGNOSIS — H401121 Primary open-angle glaucoma, left eye, mild stage: Secondary | ICD-10-CM | POA: Diagnosis not present

## 2015-05-17 ENCOUNTER — Other Ambulatory Visit: Payer: Self-pay | Admitting: Family Medicine

## 2015-05-17 DIAGNOSIS — E113311 Type 2 diabetes mellitus with moderate nonproliferative diabetic retinopathy with macular edema, right eye: Secondary | ICD-10-CM | POA: Diagnosis not present

## 2015-05-24 DIAGNOSIS — E113312 Type 2 diabetes mellitus with moderate nonproliferative diabetic retinopathy with macular edema, left eye: Secondary | ICD-10-CM | POA: Diagnosis not present

## 2015-06-10 ENCOUNTER — Other Ambulatory Visit: Payer: Self-pay | Admitting: Family Medicine

## 2015-06-23 DIAGNOSIS — E113311 Type 2 diabetes mellitus with moderate nonproliferative diabetic retinopathy with macular edema, right eye: Secondary | ICD-10-CM | POA: Diagnosis not present

## 2015-06-23 DIAGNOSIS — E113312 Type 2 diabetes mellitus with moderate nonproliferative diabetic retinopathy with macular edema, left eye: Secondary | ICD-10-CM | POA: Diagnosis not present

## 2015-06-25 DIAGNOSIS — E113311 Type 2 diabetes mellitus with moderate nonproliferative diabetic retinopathy with macular edema, right eye: Secondary | ICD-10-CM | POA: Diagnosis not present

## 2015-06-25 DIAGNOSIS — E113312 Type 2 diabetes mellitus with moderate nonproliferative diabetic retinopathy with macular edema, left eye: Secondary | ICD-10-CM | POA: Diagnosis not present

## 2015-06-29 ENCOUNTER — Other Ambulatory Visit: Payer: Self-pay | Admitting: Family Medicine

## 2015-07-04 LAB — HM DIABETES EYE EXAM

## 2015-07-16 ENCOUNTER — Other Ambulatory Visit: Payer: Self-pay | Admitting: Family Medicine

## 2015-07-27 ENCOUNTER — Ambulatory Visit (INDEPENDENT_AMBULATORY_CARE_PROVIDER_SITE_OTHER): Payer: Medicare Other | Admitting: Family Medicine

## 2015-07-27 ENCOUNTER — Encounter: Payer: Self-pay | Admitting: Family Medicine

## 2015-07-27 VITALS — BP 134/72 | HR 60 | Temp 97.5°F | Wt 192.0 lb

## 2015-07-27 DIAGNOSIS — IMO0002 Reserved for concepts with insufficient information to code with codable children: Secondary | ICD-10-CM

## 2015-07-27 DIAGNOSIS — Z0000 Encounter for general adult medical examination without abnormal findings: Secondary | ICD-10-CM | POA: Diagnosis not present

## 2015-07-27 DIAGNOSIS — E113319 Type 2 diabetes mellitus with moderate nonproliferative diabetic retinopathy with macular edema, unspecified eye: Secondary | ICD-10-CM

## 2015-07-27 DIAGNOSIS — I10 Essential (primary) hypertension: Secondary | ICD-10-CM

## 2015-07-27 DIAGNOSIS — Z23 Encounter for immunization: Secondary | ICD-10-CM | POA: Diagnosis not present

## 2015-07-27 DIAGNOSIS — H3530 Unspecified macular degeneration: Secondary | ICD-10-CM

## 2015-07-27 DIAGNOSIS — Z85038 Personal history of other malignant neoplasm of large intestine: Secondary | ICD-10-CM

## 2015-07-27 DIAGNOSIS — I6529 Occlusion and stenosis of unspecified carotid artery: Secondary | ICD-10-CM | POA: Diagnosis not present

## 2015-07-27 DIAGNOSIS — E1122 Type 2 diabetes mellitus with diabetic chronic kidney disease: Secondary | ICD-10-CM | POA: Diagnosis not present

## 2015-07-27 DIAGNOSIS — E1165 Type 2 diabetes mellitus with hyperglycemia: Secondary | ICD-10-CM | POA: Diagnosis not present

## 2015-07-27 DIAGNOSIS — E538 Deficiency of other specified B group vitamins: Secondary | ICD-10-CM

## 2015-07-27 DIAGNOSIS — D631 Anemia in chronic kidney disease: Secondary | ICD-10-CM | POA: Diagnosis not present

## 2015-07-27 DIAGNOSIS — E11311 Type 2 diabetes mellitus with unspecified diabetic retinopathy with macular edema: Secondary | ICD-10-CM

## 2015-07-27 DIAGNOSIS — N185 Chronic kidney disease, stage 5: Secondary | ICD-10-CM | POA: Diagnosis not present

## 2015-07-27 DIAGNOSIS — Z1239 Encounter for other screening for malignant neoplasm of breast: Secondary | ICD-10-CM

## 2015-07-27 DIAGNOSIS — M1990 Unspecified osteoarthritis, unspecified site: Secondary | ICD-10-CM

## 2015-07-27 DIAGNOSIS — Z7189 Other specified counseling: Secondary | ICD-10-CM | POA: Insufficient documentation

## 2015-07-27 DIAGNOSIS — E890 Postprocedural hypothyroidism: Secondary | ICD-10-CM

## 2015-07-27 DIAGNOSIS — Z853 Personal history of malignant neoplasm of breast: Secondary | ICD-10-CM | POA: Diagnosis not present

## 2015-07-27 DIAGNOSIS — M879 Osteonecrosis, unspecified: Secondary | ICD-10-CM

## 2015-07-27 DIAGNOSIS — N189 Chronic kidney disease, unspecified: Secondary | ICD-10-CM

## 2015-07-27 DIAGNOSIS — Z748 Other problems related to care provider dependency: Secondary | ICD-10-CM

## 2015-07-27 DIAGNOSIS — Z1211 Encounter for screening for malignant neoplasm of colon: Secondary | ICD-10-CM

## 2015-07-27 NOTE — Progress Notes (Signed)
BP 134/72 mmHg  Pulse 60  Temp(Src) 97.5 F (36.4 C) (Oral)  Wt 192 lb (87.091 kg)   CC: medicare wellness visit  Subjective:    Patient ID: Alexis Tucker, female    DOB: Jun 04, 1941, 75 y.o.   MRN: [id number]  HPI: Alexis Tucker is a 75 y.o. female presenting on 07/27/2015 for Annual Exam   Last seen 03/2014  End stage renal disease not on dialysis - sees nephrologist Dr. Candiss Norse. Last saw 6 mo ago. "Still" working on AV graft placement L arm with [facility] ([facility]). Has visited [facility].  Lab Results  Component Value Date   CREATININE 4.32* 07/27/2015    Stopped driving [id number] due to worsening vision. Very frustrated with transportation situation here - needs to pay taxi $20 every medical visit. Daughter lives in [place], [place] move locally. Does have local granddaughter but she has her own family.   Worsening joint pains - back and hips. H/o ?osteonecrosis.  H/o B12 deficiency, last check high. Takes folic acid, I50 and b6 vitamins (combo pill). Also takes iron daily.   Sees Dr Oval Linsey at [facility] doctor regularly last 07/2015. Bilateral macular edema R>L. Receives avastin and ?alaya shots Passes hearing screen today.  No falls in last year.  Denies depression/anhedonia, sadness besides struggling with current social and transport situation.   Preventative: Well woman was 2008 (OBGYN at [facility]). Looking to establish with local OBGYN. Declines for now.  COLONOSCOPY Date: 2008 ext hem, ileo-colonic anastomosis in cecum, tubular adenoma x1, rec rpt 1 yr for multiple polyps (done in [place]). H/o colon cancer 2005 s/p colectomy. Discussed - declines rpt colonoscopy but agrees to stool kit.  Mammogram 01/2013 normal [facility]. H/o breast cancer 2008, currently on arimidex. Unclear when to stop. Agrees to schedule mammogram. Dexa scan - has had, thinks about 10 yrs ago. No records Pneumovax 2011, prevnar 2015 Flu shot - today. Tetanus - unsure, thinks ~2013 Shingles shot -  may receive at [facility] but has not received yet.  Advanced directives/living will - wants grand daughter and daughter to be HCPOA. Asked to bring me a copy Seat belt use discussed. No changing moles on skin.  Lives alone, 1 dog. Granddaughter in [place].  Divorced  Occu: retired, worked in Engineer, technical sales  Edu: college  Relevant past medical, surgical, family and social history reviewed and updated as indicated. Interim medical history since our last visit reviewed. Allergies and medications reviewed and updated. Current Outpatient Prescriptions on File Prior to Visit  Medication Sig  . allopurinol (ZYLOPRIM) 100 MG tablet TAKE 1 TABLET BY MOUTH DAILY -NEED PHYSICAL  . anastrozole (ARIMIDEX) 1 MG tablet TAKE 1 TABLET BY MOUTH DAILY  . bevacizumab (AVASTIN) 2.5 mg/0.1 mL SOLN 0.1 mLs (2.5 mg total) by Intravitreal route once. Dr. Oval Linsey [facility] Specialist  . Calcium-Magnesium-Vitamin D (CALCIUM MAGNESIUM PO) Take 1 tablet by mouth daily.  . carvedilol (COREG) 25 MG tablet TAKE 1 TABLET BY MOUTH DAILY  . Cholecalciferol (VITAMIN D-3) 5000 UNITS TABS Take 1 tablet by mouth daily.  . Cyanocobalamin (B-12) 2500 MCG SUBL Place 1 tablet under the tongue daily.  . diclofenac sodium (VOLTAREN) 1 % GEL Apply 1 application topically 3 (three) times daily.  Marland Kitchen FERROUS GLUCONATE PO Take 28 mg by mouth daily.  . folic acid (FOLVITE) 277 MCG tablet Take 400 mcg by mouth daily.  . hydrALAZINE (APRESOLINE) 50 MG tablet Take 1 tablet (50 mg total) by mouth daily.  Marland Kitchen latanoprost (XALATAN) 0.005 %  ophthalmic solution Place 1 drop into both eyes at bedtime.  . NON FORMULARY Immune essentials, phosphatidylcholine, iodoral, ultimate flora probiotic  . Omega Fatty Acids-Phytosterols (PA FISH OIL/PHYTOSTEROLS) 450-400 MG CAPS Take 1 tablet by mouth daily.  . pentoxifylline (TRENTAL) 400 MG CR tablet TAKE 1 TABLET BY MOUTH DAILY  . selenium 50 MCG TABS Take 50 mcg by mouth daily.  . Acetylcysteine (N-ACETYL-L-CYSTEINE) 600 MG  CAPS Take 1 capsule by mouth daily.  . Alpha-Lipoic Acid 100 MG CAPS Take 1 capsule by mouth daily. Reported on 07/27/2015  . Blood Glucose Monitoring Suppl (ACURA BLOOD GLUCOSE METER) W/DEVICE KIT Use as directed to check sugars daily 250.42  . Coenzyme Q10 75 MG CAPS Take 1 capsule by mouth daily. Reported on 07/27/2015  . furosemide (LASIX) 40 MG tablet Take 1 tablet (40 mg total) by mouth daily. (Patient not taking: Reported on 07/27/2015)  . LevOCARNitine L-Tartrate (L-CARNITINE) 500 MG CAPS Take 1 capsule by mouth daily. Reported on 07/27/2015  . methocarbamol (ROBAXIN) 500 MG tablet Take 1 tablet (500 mg total) by mouth 2 (two) times daily as needed for muscle spasms. (Patient not taking: Reported on 07/27/2015)  . NON FORMULARY Reported on 07/27/2015  . Raspberry Ketones 100 MG CAPS Take 1 capsule by mouth daily. Reported on 07/27/2015   No current facility-administered medications on file prior to visit.    Review of Systems Per HPI unless specifically indicated in ROS section     Objective:    BP 134/72 mmHg  Pulse 60  Temp(Src) 97.5 F (36.4 C) (Oral)  Wt 192 lb (87.091 kg)  Wt Readings from Last 3 Encounters:  07/27/15 192 lb (87.091 kg)  03/13/14 237 lb 12 oz (107.843 kg)  01/07/13 241 lb (109.317 kg)   Body mass index is 33.47 kg/(m^2).  Physical Exam  Constitutional: She is oriented to person, place, and time. She appears well-developed and well-nourished. No distress.  Walks with cane  HENT:  Head: Normocephalic and atraumatic.  Right Ear: Hearing, tympanic membrane, external ear and ear canal normal.  Left Ear: Hearing, tympanic membrane, external ear and ear canal normal.  Nose: Nose normal.  Mouth/Throat: Uvula is midline, oropharynx is clear and moist and mucous membranes are normal. No oropharyngeal exudate, posterior oropharyngeal edema or posterior oropharyngeal erythema.  Eyes: Conjunctivae and EOM are normal. Pupils are equal, round, and reactive to light. No  scleral icterus.  Neck: Normal range of motion. Neck supple. Carotid bruit is present (L>R). No thyromegaly present.  Cardiovascular: Normal rate, regular rhythm, normal heart sounds and intact distal pulses.   No murmur heard. Pulses:      Radial pulses are 2+ on the right side, and 2+ on the left side.  Pulmonary/Chest: Effort normal and breath sounds normal. No respiratory distress. She has no decreased breath sounds. She has no wheezes. She has no rhonchi. She has no rales. Right breast exhibits no inverted nipple, no mass, no nipple discharge, no skin change and no tenderness. Left breast exhibits mass (x2). Left breast exhibits no inverted nipple, no nipple discharge, no skin change and no tenderness. Breasts are symmetrical.  Left lateral breast with scars and lumps 3 o clock and 5 o clock at prior mammosite stes  Abdominal: Soft. Bowel sounds are normal. She exhibits no distension and no mass. There is no tenderness. There is no rebound and no guarding.  Musculoskeletal: Normal range of motion. She exhibits no edema.  Lymphadenopathy:       Head (right side):  No submental, no submandibular, no tonsillar, no preauricular and no posterior auricular adenopathy present.       Head (left side): No submental, no submandibular, no tonsillar, no preauricular and no posterior auricular adenopathy present.    She has no cervical adenopathy.    She has no axillary adenopathy.       Right axillary: No lateral adenopathy present.       Left axillary: No lateral adenopathy present.      Right: No supraclavicular adenopathy present.       Left: No supraclavicular adenopathy present.  Neurological: She is alert and oriented to person, place, and time.  CN grossly intact, station and gait intact Recall 3/3 Struggles with calculation but 5/5 serial 3s, unable to do serial 7s  Skin: Skin is warm and dry. No rash noted.  Psychiatric: She has a normal mood and affect. Her behavior is normal. Judgment and  thought content normal.  Nursing note and vitals reviewed.  Results for orders placed or performed in visit on 07/27/15  Lipid panel  Result Value Ref Range   Cholesterol 172 0 - 200 mg/dL   Triglycerides 78.0 0.0 - 149.0 mg/dL   HDL 62.00 >39.00 mg/dL   VLDL 15.6 0.0 - 40.0 mg/dL   LDL Cholesterol 95 0 - 99 mg/dL   Total CHOL/HDL Ratio 3    NonHDL 110.39   Comprehensive metabolic panel  Result Value Ref Range   Sodium 142 135 - 145 mEq/L   Potassium 4.6 3.5 - 5.1 mEq/L   Chloride 111 96 - 112 mEq/L   CO2 24 19 - 32 mEq/L   Glucose, Bld 120 (H) 70 - 99 mg/dL   BUN 33 (H) 6 - 23 mg/dL   Creatinine, Ser 4.32 (H) 0.40 - 1.20 mg/dL   Total Bilirubin 0.4 0.2 - 1.2 mg/dL   Alkaline Phosphatase 91 39 - 117 U/L   AST 20 0 - 37 U/L   ALT 15 0 - 35 U/L   Total Protein 7.3 6.0 - 8.3 g/dL   Albumin 4.2 3.5 - 5.2 g/dL   Calcium 9.5 8.4 - 10.5 mg/dL   GFR 12.94 (LL) >60.00 mL/min  Ferritin  Result Value Ref Range   Ferritin 102.1 10.0 - 291.0 ng/mL  Vitamin B12  Result Value Ref Range   Vitamin B-12 1346 (H) 211 - 911 pg/mL  IBC panel  Result Value Ref Range   Iron 90 42 - 145 ug/dL   Transferrin 237.0 212.0 - 360.0 mg/dL   Saturation Ratios 27.1 20.0 - 50.0 %  Folate  Result Value Ref Range   Folate 16.3 >5.9 ng/mL  TSH  Result Value Ref Range   TSH 2.79 0.35 - 4.50 uIU/mL  CBC with Differential/Platelet  Result Value Ref Range   WBC 5.4 4.0 - 10.5 K/uL   RBC 3.89 3.87 - 5.11 Mil/uL   Hemoglobin 11.2 (L) 12.0 - 15.0 g/dL   HCT 35.2 (L) 36.0 - 46.0 %   MCV 90.4 78.0 - 100.0 fl   MCHC 31.9 30.0 - 36.0 g/dL   RDW 15.1 11.5 - 15.5 %   Platelets 139.0 (L) 150.0 - 400.0 K/uL   Neutrophils Relative % 61.6 43.0 - 77.0 %   Lymphocytes Relative 27.6 12.0 - 46.0 %   Monocytes Relative 8.9 3.0 - 12.0 %   Eosinophils Relative 1.7 0.0 - 5.0 %   Basophils Relative 0.2 0.0 - 3.0 %   Neutro Abs 3.4 1.4 - 7.7 K/uL  Lymphs Abs 1.5 0.7 - 4.0 K/uL   Monocytes Absolute 0.5 0.1 - 1.0  K/uL   Eosinophils Absolute 0.1 0.0 - 0.7 K/uL   Basophils Absolute 0.0 0.0 - 0.1 K/uL  Hemoglobin A1c  Result Value Ref Range   Hgb A1c MFr Bld 6.1 4.6 - 6.5 %  Parathyroid hormone, intact (no Ca)  Result Value Ref Range   PTH 235 (H) 14 - 64 pg/mL  VITAMIN D 25 Hydroxy (Vit-D Deficiency, Fractures)  Result Value Ref Range   VITD 63.77 30.00 - 100.00 ng/mL  HM DIABETES EYE EXAM  Result Value Ref Range   HM Diabetic Eye Exam Retinopathy (A) No Retinopathy      Assessment & Plan:   Problem List Items Addressed This Visit    Type 2 diabetes mellitus, uncontrolled, with renal complications ([place])    Transportation barrier to compliance - touched base with our health advisor who will contact [place].  Check labs today.      Relevant Orders   TSH (Completed)   Hemoglobin A1c (Completed)   Post-surgical hypothyroidism    Continue armour thyroid, check TSH today.      Osteonecrosis ([place])    Unclear history, no records available, never on bisphosphonate.      OA (osteoarthritis)    Endorses worsening recently.       Medicare annual wellness visit, subsequent - Primary    I have personally reviewed the Medicare Annual Wellness questionnaire and have noted 1. The patient's medical and social history 2. Their use of alcohol, tobacco or illicit drugs 3. Their current medications and supplements 4. The patient's functional ability including ADL's, fall risks, home safety risks and hearing or visual impairment. Cognitive function has been assessed and addressed as indicated.  5. Diet and physical activity 6. Evidence for depression or mood disorders The patients weight, height, BMI have been recorded in the chart. I have made referrals, counseling and provided education to the patient based on review of the above and I have provided the pt with a written personalized care plan for preventive services. Provider list updated.. See scanned questionairre as needed for further  documentation. Reviewed preventative protocols and updated unless pt declined.       Macular degeneration    With vision loss. Sees [facility].      HTN (hypertension)    Chronic, stable. Continue current regimen.      Relevant Orders   Lipid panel (Completed)   Comprehensive metabolic panel (Completed)   Ferritin (Completed)   History of colon cancer    Discussed with patient - she declines colonoscopy at this time but agrees to completing stool kit.      History of breast cancer    Ordered diagnostic mammogram with bilat [place] as is protocol at [place].      Relevant Orders   MM Digital Diagnostic Bilat   US BREAST COMPLETE UNI LEFT INC AXILLA   US BREAST COMPLETE UNI RIGHT INC AXILLA   Diabetic retinopathy with macular edema (HCC)    Sees Dr Oval Linsey at [facility] center.      Chronic kidney disease, stage V ([place])    Sounds like she has not followed up with renal for AVG for HD given transportation difficulty. Discussed importance of f/u with renal, recommended she call to schedule app with nephrology. I have touched base with our health coach Kristeen Miss to see what other resources we can provide her.      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  Due for rpt carotid US, declines at this time due to transportation issues.      Assistance needed with transportation    Will work with our health coach and hopefully [facility] to address barriers to medical care identified today.      Advanced care planning/counseling discussion    Advanced directives/living will - wants grand daughter and daughter to be HCPOA. Asked to bring me a copy       Other Visit Diagnoses     Need for influenza vaccination        Relevant Orders    Flu Vaccine QUAD 36+ mos PF IM (Fluarix & Fluzone Quad PF) (Completed)    Breast cancer screening        Relevant Orders    MM Digital Diagnostic Bilat    US BREAST COMPLETE UNI LEFT INC AXILLA    US BREAST COMPLETE UNI RIGHT INC AXILLA    Special screening for malignant neoplasms, colon        Relevant Orders    Fecal occult blood, imunochemical    Vitamin B12 deficiency        Relevant Orders    Vitamin B12 (Completed)    Anemia in chronic kidney disease        Relevant Orders    Ferritin (Completed)    IBC panel (Completed)    CBC with Differential/Platelet (Completed)        Follow up plan: Return in about 6 months (around 01/24/2016), or as needed, for follow up visit.

## 2015-07-27 NOTE — Assessment & Plan Note (Signed)

## 2015-07-27 NOTE — Patient Instructions (Addendum)
Flu shot today labwork today I've put referral in for mammogram.  Call to schedule follow up with Dr Candiss Norse.  Look into slow release iron - better tolerated.  Bring [place] copy of your last bone density scan if you find it at home.  Bring [place] copy of advanced directive to update your chart.  Return to see me in 6 months for follow up  Health Maintenance, Female Adopting a healthy lifestyle and getting preventive care can go a long way to promote health and wellness. Talk with your health care provider about what schedule of regular examinations is right for you. This is a good chance for you to check in with your provider about disease prevention and staying healthy. In between checkups, there are plenty of things you can do on your own. Experts have done a lot of research about which lifestyle changes and preventive measures are most likely to keep you healthy. Ask your health care provider for more information. WEIGHT AND DIET  Eat a healthy diet  Be sure to include plenty of vegetables, fruits, low-fat dairy products, and lean protein.  Do not eat a lot of foods high in solid fats, added sugars, or salt.  Get regular exercise. This is one of the most important things you can do for your health.  Most adults should exercise for at least 150 minutes each week. The exercise should increase your heart rate and make you sweat (moderate-intensity exercise).  Most adults should also do strengthening exercises at least twice a week. This is in addition to the moderate-intensity exercise.  Maintain a healthy weight  Body mass index (BMI) is a measurement that can be used to identify possible weight problems. It estimates body fat based on height and weight. Your health care provider can help determine your BMI and help you achieve or maintain a healthy weight.  For females 40 years of age and older:   A BMI below 18.5 is considered underweight.  A BMI of 18.5 to 24.9 is normal.  A BMI of 25 to  29.9 is considered overweight.  A BMI of 30 and above is considered obese.  Watch levels of cholesterol and blood lipids  You should start having your blood tested for lipids and cholesterol at 73 years of age, then have this test every 5 years.  You may need to have your cholesterol levels checked more often if:  Your lipid or cholesterol levels are high.  You are older than 73 years of age.  You are at high risk for heart disease.  CANCER SCREENING   Lung Cancer  Lung cancer screening is recommended for adults 35-48 years old who are at high risk for lung cancer because of a history of smoking.  A yearly low-dose CT scan of the lungs is recommended for people who:  Currently smoke.  Have quit within the past 15 years.  Have at least a 30-pack-year history of smoking. A pack year is smoking an average of one pack of cigarettes a day for 1 year.  Yearly screening should continue until it has been 15 years since you quit.  Yearly screening should stop if you develop a health problem that would prevent you from having lung cancer treatment.  Breast Cancer  Practice breast self-awareness. This means understanding how your breasts normally appear and feel.  It also means doing regular breast self-exams. Let your health care provider know about any changes, no matter how small.  If you are in your 25s  or 60s, you should have a clinical breast exam (CBE) by a health care provider every 1-3 years as part of a regular health exam.  If you are 15 or older, have a CBE every year. Also consider having a breast X-ray (mammogram) every year.  If you have a family history of breast cancer, talk to your health care provider about genetic screening.  If you are at high risk for breast cancer, talk to your health care provider about having an MRI and a mammogram every year.  Breast cancer gene (BRCA) assessment is recommended for women who have family members with BRCA-related  cancers. BRCA-related cancers include:  Breast.  Ovarian.  Tubal.  Peritoneal cancers.  Results of the assessment will determine the need for genetic counseling and BRCA1 and BRCA2 testing. Cervical Cancer Your health care provider may recommend that you be screened regularly for cancer of the pelvic organs (ovaries, uterus, and vagina). This screening involves a pelvic examination, including checking for microscopic changes to the surface of your cervix (Pap test). You may be encouraged to have this screening done every 3 years, beginning at age 93.  For women ages 71-65, health care providers may recommend pelvic exams and Pap testing every 3 years, or they may recommend the Pap and pelvic exam, combined with testing for human papilloma virus (HPV), every 5 years. Some types of HPV increase your risk of cervical cancer. Testing for HPV may also be done on women of any age with unclear Pap test results.  Other health care providers may not recommend any screening for nonpregnant women who are considered low risk for pelvic cancer and who do not have symptoms. Ask your health care provider if a screening pelvic exam is right for you.  If you have had past treatment for cervical cancer or a condition that could lead to cancer, you need Pap tests and screening for cancer for at least 20 years after your treatment. If Pap tests have been discontinued, your risk factors (such as having a new sexual partner) need to be reassessed to determine if screening should resume. Some women have medical problems that increase the chance of getting cervical cancer. In these cases, your health care provider may recommend more frequent screening and Pap tests. Colorectal Cancer  This type of cancer can be detected and often prevented.  Routine colorectal cancer screening usually begins at 73 years of age and continues through 73 years of age.  Your health care provider may recommend screening at an earlier  age if you have risk factors for colon cancer.  Your health care provider may also recommend using home test kits to check for hidden blood in the stool.  A small camera at the end of a tube can be used to examine your colon directly (sigmoidoscopy or colonoscopy). This is done to check for the earliest forms of colorectal cancer.  Routine screening usually begins at age 65.  Direct examination of the colon should be repeated every 5-10 years through 73 years of age. However, you may need to be screened more often if early forms of precancerous polyps or small growths are found. Skin Cancer  Check your skin from head to toe regularly.  Tell your health care provider about any new moles or changes in moles, especially if there is a change in a mole's shape or color.  Also tell your health care provider if you have a mole that is larger than the size of a pencil eraser.  Always use sunscreen. Apply sunscreen liberally and repeatedly throughout the day.  Protect yourself by wearing long sleeves, pants, a wide-brimmed hat, and sunglasses whenever you are outside. HEART DISEASE, DIABETES, AND HIGH BLOOD PRESSURE   High blood pressure causes heart disease and increases the risk of stroke. High blood pressure is more likely to develop in:  People who have blood pressure in the high end of the normal range (130-139/85-89 mm Hg).  People who are overweight or obese.  People who are African American.  If you are 83-72 years of age, have your blood pressure checked every 3-5 years. If you are 104 years of age or older, have your blood pressure checked every year. You should have your blood pressure measured twice--once when you are at a hospital or clinic, and once when you are not at a hospital or clinic. Record the average of the two measurements. To check your blood pressure when you are not at a hospital or clinic, you can use:  An automated blood pressure machine at a pharmacy.  A home  blood pressure monitor.  If you are between 77 years and 86 years old, ask your health care provider if you should take aspirin to prevent strokes.  Have regular diabetes screenings. This involves taking a blood sample to check your fasting blood sugar level.  If you are at a normal weight and have a low risk for diabetes, have this test once every three years after 73 years of age.  If you are overweight and have a high risk for diabetes, consider being tested at a younger age or more often. PREVENTING INFECTION  Hepatitis B  If you have a higher risk for hepatitis B, you should be screened for this virus. You are considered at high risk for hepatitis B if:  You were born in a country where hepatitis B is common. Ask your health care provider which countries are considered high risk.  Your parents were born in a high-risk country, and you have not been immunized against hepatitis B (hepatitis B vaccine).  You have HIV or AIDS.  You use needles to inject street drugs.  You live with someone who has hepatitis B.  You have had sex with someone who has hepatitis B.  You get hemodialysis treatment.  You take certain medicines for conditions, including cancer, organ transplantation, and autoimmune conditions. Hepatitis C  Blood testing is recommended for:  Everyone born from [birth year] through 1963.  Anyone with known risk factors for hepatitis C. Sexually transmitted infections (STIs)  You should be screened for sexually transmitted infections (STIs) including gonorrhea and chlamydia if:  You are sexually active and are younger than 73 years of age.  You are older than 73 years of age and your health care provider tells you that you are at risk for this type of infection.  Your sexual activity has changed since you were last screened and you are at an increased risk for chlamydia or gonorrhea. Ask your health care provider if you are at risk.  If you do not have HIV, but are at  risk, it may be recommended that you take a prescription medicine daily to prevent HIV infection. This is called pre-exposure prophylaxis (PrEP). You are considered at risk if:  You are sexually active and do not regularly use condoms or know the HIV status of your partner(s).  You take drugs by injection.  You are sexually active with a partner who has HIV. Talk with your health  care provider about whether you are at high risk of being infected with HIV. If you choose to begin PrEP, you should first be tested for HIV. You should then be tested every 3 months for as long as you are taking PrEP.  PREGNANCY   If you are premenopausal and you may become pregnant, ask your health care provider about preconception counseling.  If you may become pregnant, take 400 to 800 micrograms (mcg) of folic acid every day.  If you want to prevent pregnancy, talk to your health care provider about birth control (contraception). OSTEOPOROSIS AND MENOPAUSE   Osteoporosis is a disease in which the bones lose minerals and strength with aging. This can result in serious bone fractures. Your risk for osteoporosis can be identified using a bone density scan.  If you are 46 years of age or older, or if you are at risk for osteoporosis and fractures, ask your health care provider if you should be screened.  Ask your health care provider whether you should take a calcium or vitamin D supplement to lower your risk for osteoporosis.  Menopause may have certain physical symptoms and risks.  Hormone replacement therapy may reduce some of these symptoms and risks. Talk to your health care provider about whether hormone replacement therapy is right for you.  HOME CARE INSTRUCTIONS   Schedule regular health, dental, and eye exams.  Stay current with your immunizations.   Do not use any tobacco products including cigarettes, chewing tobacco, or electronic cigarettes.  If you are pregnant, do not drink alcohol.  If  you are breastfeeding, limit how much and how often you drink alcohol.  Limit alcohol intake to no more than 1 drink per day for nonpregnant women. One drink equals 12 ounces of beer, 5 ounces of wine, or 1 ounces of hard liquor.  Do not use street drugs.  Do not share needles.  Ask your health care provider for help if you need support or information about quitting drugs.  Tell your health care provider if you often feel depressed.  Tell your health care provider if you have ever been abused or do not feel safe at home.   This information is not intended to replace advice given to you by your health care provider. Make sure you discuss any questions you have with your health care provider.   Document Released: 01/02/2011 Document Revised: 07/10/2014 Document Reviewed: 05/21/2013 Elsevier Interactive Patient Education [employer].

## 2015-07-27 NOTE — Assessment & Plan Note (Signed)
Advanced directives/living will - wants grand daughter and daughter to be HCPOA. Asked to bring me a copy

## 2015-07-27 NOTE — Progress Notes (Signed)
Pre visit review using our clinic review tool, if applicable. No additional management support is needed unless otherwise documented below in the visit note. 

## 2015-07-28 ENCOUNTER — Telehealth: Payer: Self-pay | Admitting: Radiology

## 2015-07-28 LAB — COMPREHENSIVE METABOLIC PANEL
ALK PHOS: 91 U/L (ref 39–117)
ALT: 15 U/L (ref 0–35)
AST: 20 U/L (ref 0–37)
Albumin: 4.2 g/dL (ref 3.5–5.2)
BILIRUBIN TOTAL: 0.4 mg/dL (ref 0.2–1.2)
BUN: 33 mg/dL — AB (ref 6–23)
CO2: 24 meq/L (ref 19–32)
Calcium: 9.5 mg/dL (ref 8.4–10.5)
Chloride: 111 mEq/L (ref 96–112)
Creatinine, Ser: 4.32 mg/dL — ABNORMAL HIGH (ref 0.40–1.20)
GFR: 12.94 mL/min — AB (ref >60.00)
GLUCOSE: 120 mg/dL — AB (ref 70–99)
POTASSIUM: 4.6 meq/L (ref 3.5–5.1)
SODIUM: 142 meq/L (ref 135–145)
TOTAL PROTEIN: 7.3 g/dL (ref 6.0–8.3)

## 2015-07-28 LAB — VITAMIN D 25 HYDROXY (VIT D DEFICIENCY, FRACTURES): VITD: 63.77 ng/mL (ref 30.00–100.00)

## 2015-07-28 LAB — CBC WITH DIFFERENTIAL/PLATELET
BASOS ABS: 0 10*3/uL (ref 0.0–0.1)
Basophils Relative: 0.2 % (ref 0.0–3.0)
EOS ABS: 0.1 10*3/uL (ref 0.0–0.7)
EOS PCT: 1.7 % (ref 0.0–5.0)
HCT: 35.2 % — ABNORMAL LOW (ref 36.0–46.0)
HEMOGLOBIN: 11.2 g/dL — AB (ref 12.0–15.0)
LYMPHS ABS: 1.5 10*3/uL (ref 0.7–4.0)
Lymphocytes Relative: 27.6 % (ref 12.0–46.0)
MCHC: 31.9 g/dL (ref 30.0–36.0)
MCV: 90.4 fl (ref 78.0–100.0)
MONO ABS: 0.5 10*3/uL (ref 0.1–1.0)
Monocytes Relative: 8.9 % (ref 3.0–12.0)
NEUTROS PCT: 61.6 % (ref 43.0–77.0)
Neutro Abs: 3.4 10*3/uL (ref 1.4–7.7)
Platelets: 139 10*3/uL — ABNORMAL LOW (ref 150.0–400.0)
RBC: 3.89 Mil/uL (ref 3.87–5.11)
RDW: 15.1 % (ref 11.5–15.5)
WBC: 5.4 10*3/uL (ref 4.0–10.5)

## 2015-07-28 LAB — FOLATE: FOLATE: 16.3 ng/mL (ref >5.9)

## 2015-07-28 LAB — LIPID PANEL
CHOL/HDL RATIO: 3
Cholesterol: 172 mg/dL (ref 0–200)
HDL: 62 mg/dL (ref >39.00)
LDL Cholesterol: 95 mg/dL (ref 0–99)
NONHDL: 110.39
Triglycerides: 78 mg/dL (ref 0.0–149.0)
VLDL: 15.6 mg/dL (ref 0.0–40.0)

## 2015-07-28 LAB — IBC PANEL
IRON: 90 ug/dL (ref 42–145)
Saturation Ratios: 27.1 % (ref 20.0–50.0)
TRANSFERRIN: 237 mg/dL (ref 212.0–360.0)

## 2015-07-28 LAB — VITAMIN B12: Vitamin B-12: 1346 pg/mL — ABNORMAL HIGH (ref 211–911)

## 2015-07-28 LAB — FERRITIN: Ferritin: 102.1 ng/mL (ref 10.0–291.0)

## 2015-07-28 LAB — TSH: TSH: 2.79 u[IU]/mL (ref 0.35–4.50)

## 2015-07-28 LAB — HEMOGLOBIN A1C: HEMOGLOBIN A1C: 6.1 % (ref 4.6–6.5)

## 2015-07-28 NOTE — Telephone Encounter (Signed)
Known ESRD 

## 2015-07-28 NOTE — Telephone Encounter (Signed)
[facility] called a critical result, GFR - 12.94 (BUN 33, CRT 4.32) Results given to Dr Danise Mina

## 2015-07-29 ENCOUNTER — Other Ambulatory Visit: Payer: Self-pay | Admitting: Family Medicine

## 2015-07-30 ENCOUNTER — Telehealth: Payer: Self-pay

## 2015-07-30 LAB — PARATHYROID HORMONE, INTACT (NO CA): PTH: 235 pg/mL — ABNORMAL HIGH (ref 14–64)

## 2015-07-30 NOTE — Telephone Encounter (Signed)
Attempted to reach patient based on physician referral. Will attempt to reach patient again to obtain verbal consent for referral to [facility].

## 2015-07-31 ENCOUNTER — Other Ambulatory Visit: Payer: Self-pay | Admitting: Family Medicine

## 2015-07-31 DIAGNOSIS — Z748 Other problems related to care provider dependency: Secondary | ICD-10-CM | POA: Insufficient documentation

## 2015-07-31 NOTE — Assessment & Plan Note (Signed)
Endorses worsening recently.

## 2015-07-31 NOTE — Assessment & Plan Note (Signed)
Sounds like she has not followed up with renal for AVG for HD given transportation difficulty. Discussed importance of f/u with renal, recommended she call to schedule app with nephrology. I have touched base with our health coach Kristeen Miss to see what other resources we can provide her.

## 2015-07-31 NOTE — Assessment & Plan Note (Signed)
Chronic, stable. Continue current regimen. 

## 2015-07-31 NOTE — Assessment & Plan Note (Addendum)
Sees Dr Oval Linsey at [facility].

## 2015-07-31 NOTE — Assessment & Plan Note (Signed)
With vision loss. Sees [facility].

## 2015-07-31 NOTE — Assessment & Plan Note (Signed)
Continue armour thyroid, check TSH today.

## 2015-07-31 NOTE — Assessment & Plan Note (Addendum)
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  Due for rpt carotid US, declines at this time due to transportation issues.

## 2015-07-31 NOTE — Assessment & Plan Note (Addendum)
Ordered diagnostic mammogram with bilat [place] as is protocol at [facility].

## 2015-07-31 NOTE — Assessment & Plan Note (Addendum)
Transportation barrier to compliance - touched base with our health advisor who will contact [place].  Check labs today.

## 2015-07-31 NOTE — Assessment & Plan Note (Deleted)
Chronic with vision loss, closely followed by eye doctor.

## 2015-07-31 NOTE — Assessment & Plan Note (Signed)
Unclear history, no records available, never on bisphosphonate.

## 2015-07-31 NOTE — Assessment & Plan Note (Signed)
Will work with our health coach and hopefully [facility] to address barriers to medical care identified today.

## 2015-07-31 NOTE — Assessment & Plan Note (Signed)
Discussed with patient - she declines colonoscopy at this time but agrees to completing stool kit.

## 2015-08-02 ENCOUNTER — Encounter: Payer: Self-pay | Admitting: *Deleted

## 2015-08-04 ENCOUNTER — Encounter: Payer: Self-pay | Admitting: *Deleted

## 2015-08-09 ENCOUNTER — Encounter: Payer: Self-pay | Admitting: Family Medicine

## 2015-09-06 DIAGNOSIS — N185 Chronic kidney disease, stage 5: Secondary | ICD-10-CM | POA: Diagnosis not present

## 2015-09-06 DIAGNOSIS — E872 Acidosis: Secondary | ICD-10-CM | POA: Diagnosis not present

## 2015-09-06 DIAGNOSIS — I129 Hypertensive chronic kidney disease with stage 1 through stage 4 chronic kidney disease, or unspecified chronic kidney disease: Secondary | ICD-10-CM | POA: Diagnosis not present

## 2015-09-11 NOTE — Telephone Encounter (Signed)
Will send message to our care guide to see if we can provide pt with further resources for transportation.

## 2015-09-14 ENCOUNTER — Telehealth: Payer: Self-pay | Admitting: Family Medicine

## 2015-09-14 NOTE — Telephone Encounter (Signed)
Dr Darnell Level  I wanted to let you know i have tried calling ms Lanzer several times since jan  and mailed her a letter asking her to call office, to schedule her diagnostic mammogram and ultra sounds.

## 2015-10-06 ENCOUNTER — Encounter: Payer: Self-pay | Admitting: Family Medicine

## 2015-10-19 ENCOUNTER — Other Ambulatory Visit: Payer: Self-pay | Admitting: Family Medicine

## 2015-10-19 ENCOUNTER — Telehealth: Payer: Self-pay | Admitting: Family Medicine

## 2015-10-19 NOTE — Telephone Encounter (Signed)
Letter (reminder to schedule an appt for mammogram & ultrasound)  sent out by certified / registered mail on October 07, 2015 [facility]  Received signed domestic return receipt verifying delivery of certified letter on October 15, 2015. Article number  [id number] [place] [facility]

## 2015-11-24 ENCOUNTER — Other Ambulatory Visit: Payer: Self-pay | Admitting: Family Medicine

## 2015-12-01 DIAGNOSIS — E113312 Type 2 diabetes mellitus with moderate nonproliferative diabetic retinopathy with macular edema, left eye: Secondary | ICD-10-CM | POA: Diagnosis not present

## 2016-01-08 ENCOUNTER — Other Ambulatory Visit: Payer: Self-pay | Admitting: Family Medicine

## 2016-01-10 NOTE — Telephone Encounter (Signed)
Received refill request electronically Last refill 09/15/14 #90/3 Last office visit 07/27/15

## 2016-02-18 DIAGNOSIS — E113312 Type 2 diabetes mellitus with moderate nonproliferative diabetic retinopathy with macular edema, left eye: Secondary | ICD-10-CM | POA: Diagnosis not present

## 2016-03-31 DIAGNOSIS — E113312 Type 2 diabetes mellitus with moderate nonproliferative diabetic retinopathy with macular edema, left eye: Secondary | ICD-10-CM | POA: Diagnosis not present

## 2016-04-10 DIAGNOSIS — Z23 Encounter for immunization: Secondary | ICD-10-CM | POA: Diagnosis not present

## 2016-05-03 ENCOUNTER — Observation Stay
Admission: EM | Admit: 2016-05-03 | Discharge: 2016-05-08 | Disposition: A | Discharge status: Home / Self Care | Payer: Medicare Other | Attending: Internal Medicine | Admitting: Internal Medicine

## 2016-05-03 DIAGNOSIS — R112 Nausea with vomiting, unspecified: Secondary | ICD-10-CM | POA: Diagnosis present

## 2016-05-03 DIAGNOSIS — D631 Anemia in chronic kidney disease: Secondary | ICD-10-CM | POA: Insufficient documentation

## 2016-05-03 DIAGNOSIS — N186 End stage renal disease: Secondary | ICD-10-CM | POA: Diagnosis not present

## 2016-05-03 DIAGNOSIS — Z87891 Personal history of nicotine dependence: Secondary | ICD-10-CM | POA: Insufficient documentation

## 2016-05-03 DIAGNOSIS — M160 Bilateral primary osteoarthritis of hip: Secondary | ICD-10-CM | POA: Insufficient documentation

## 2016-05-03 DIAGNOSIS — K210 Gastro-esophageal reflux disease with esophagitis: Secondary | ICD-10-CM | POA: Insufficient documentation

## 2016-05-03 DIAGNOSIS — I132 Hypertensive heart and chronic kidney disease with heart failure and with stage 5 chronic kidney disease, or end stage renal disease: Secondary | ICD-10-CM | POA: Insufficient documentation

## 2016-05-03 DIAGNOSIS — N185 Chronic kidney disease, stage 5: Secondary | ICD-10-CM

## 2016-05-03 DIAGNOSIS — E1122 Type 2 diabetes mellitus with diabetic chronic kidney disease: Secondary | ICD-10-CM | POA: Diagnosis not present

## 2016-05-03 DIAGNOSIS — E1151 Type 2 diabetes mellitus with diabetic peripheral angiopathy without gangrene: Secondary | ICD-10-CM | POA: Insufficient documentation

## 2016-05-03 DIAGNOSIS — E872 Acidosis: Secondary | ICD-10-CM | POA: Insufficient documentation

## 2016-05-03 DIAGNOSIS — Z90710 Acquired absence of both cervix and uterus: Secondary | ICD-10-CM | POA: Insufficient documentation

## 2016-05-03 DIAGNOSIS — K529 Noninfective gastroenteritis and colitis, unspecified: Principal | ICD-10-CM | POA: Insufficient documentation

## 2016-05-03 DIAGNOSIS — R531 Weakness: Secondary | ICD-10-CM | POA: Insufficient documentation

## 2016-05-03 DIAGNOSIS — I6523 Occlusion and stenosis of bilateral carotid arteries: Secondary | ICD-10-CM | POA: Insufficient documentation

## 2016-05-03 DIAGNOSIS — Z853 Personal history of malignant neoplasm of breast: Secondary | ICD-10-CM | POA: Insufficient documentation

## 2016-05-03 DIAGNOSIS — I272 Pulmonary hypertension, unspecified: Secondary | ICD-10-CM | POA: Insufficient documentation

## 2016-05-03 DIAGNOSIS — Z9049 Acquired absence of other specified parts of digestive tract: Secondary | ICD-10-CM | POA: Insufficient documentation

## 2016-05-03 DIAGNOSIS — H353210 Exudative age-related macular degeneration, right eye, stage unspecified: Secondary | ICD-10-CM | POA: Diagnosis not present

## 2016-05-03 DIAGNOSIS — I120 Hypertensive chronic kidney disease with stage 5 chronic kidney disease or end stage renal disease: Secondary | ICD-10-CM | POA: Diagnosis not present

## 2016-05-03 DIAGNOSIS — Z7982 Long term (current) use of aspirin: Secondary | ICD-10-CM | POA: Insufficient documentation

## 2016-05-03 DIAGNOSIS — Z8261 Family history of arthritis: Secondary | ICD-10-CM | POA: Insufficient documentation

## 2016-05-03 DIAGNOSIS — Z85038 Personal history of other malignant neoplasm of large intestine: Secondary | ICD-10-CM | POA: Insufficient documentation

## 2016-05-03 DIAGNOSIS — M6281 Muscle weakness (generalized): Secondary | ICD-10-CM

## 2016-05-03 DIAGNOSIS — Z811 Family history of alcohol abuse and dependence: Secondary | ICD-10-CM | POA: Insufficient documentation

## 2016-05-03 DIAGNOSIS — K269 Duodenal ulcer, unspecified as acute or chronic, without hemorrhage or perforation: Secondary | ICD-10-CM | POA: Insufficient documentation

## 2016-05-03 DIAGNOSIS — Z8249 Family history of ischemic heart disease and other diseases of the circulatory system: Secondary | ICD-10-CM | POA: Insufficient documentation

## 2016-05-03 DIAGNOSIS — E1169 Type 2 diabetes mellitus with other specified complication: Secondary | ICD-10-CM | POA: Diagnosis present

## 2016-05-03 DIAGNOSIS — I5032 Chronic diastolic (congestive) heart failure: Secondary | ICD-10-CM | POA: Diagnosis not present

## 2016-05-03 DIAGNOSIS — K2970 Gastritis, unspecified, without bleeding: Secondary | ICD-10-CM | POA: Diagnosis not present

## 2016-05-03 DIAGNOSIS — E11311 Type 2 diabetes mellitus with unspecified diabetic retinopathy with macular edema: Secondary | ICD-10-CM | POA: Diagnosis not present

## 2016-05-03 DIAGNOSIS — Z882 Allergy status to sulfonamides status: Secondary | ICD-10-CM | POA: Insufficient documentation

## 2016-05-03 DIAGNOSIS — R748 Abnormal levels of other serum enzymes: Secondary | ICD-10-CM | POA: Insufficient documentation

## 2016-05-03 DIAGNOSIS — R410 Disorientation, unspecified: Secondary | ICD-10-CM | POA: Insufficient documentation

## 2016-05-03 DIAGNOSIS — Z833 Family history of diabetes mellitus: Secondary | ICD-10-CM | POA: Insufficient documentation

## 2016-05-03 DIAGNOSIS — E1165 Type 2 diabetes mellitus with hyperglycemia: Secondary | ICD-10-CM | POA: Insufficient documentation

## 2016-05-03 DIAGNOSIS — IMO0002 Reserved for concepts with insufficient information to code with codable children: Secondary | ICD-10-CM | POA: Diagnosis present

## 2016-05-03 DIAGNOSIS — M109 Gout, unspecified: Secondary | ICD-10-CM | POA: Diagnosis not present

## 2016-05-03 DIAGNOSIS — K317 Polyp of stomach and duodenum: Secondary | ICD-10-CM | POA: Insufficient documentation

## 2016-05-03 DIAGNOSIS — M1990 Unspecified osteoarthritis, unspecified site: Secondary | ICD-10-CM | POA: Diagnosis not present

## 2016-05-03 DIAGNOSIS — H409 Unspecified glaucoma: Secondary | ICD-10-CM | POA: Insufficient documentation

## 2016-05-03 DIAGNOSIS — R1110 Vomiting, unspecified: Secondary | ICD-10-CM | POA: Diagnosis not present

## 2016-05-03 DIAGNOSIS — I482 Chronic atrial fibrillation, unspecified: Secondary | ICD-10-CM

## 2016-05-03 DIAGNOSIS — Z96651 Presence of right artificial knee joint: Secondary | ICD-10-CM | POA: Insufficient documentation

## 2016-05-03 DIAGNOSIS — I480 Paroxysmal atrial fibrillation: Secondary | ICD-10-CM | POA: Diagnosis not present

## 2016-05-03 DIAGNOSIS — R7989 Other specified abnormal findings of blood chemistry: Secondary | ICD-10-CM | POA: Diagnosis present

## 2016-05-03 DIAGNOSIS — M170 Bilateral primary osteoarthritis of knee: Secondary | ICD-10-CM | POA: Insufficient documentation

## 2016-05-03 DIAGNOSIS — R778 Other specified abnormalities of plasma proteins: Secondary | ICD-10-CM

## 2016-05-03 DIAGNOSIS — N390 Urinary tract infection, site not specified: Secondary | ICD-10-CM | POA: Diagnosis not present

## 2016-05-03 DIAGNOSIS — H353120 Nonexudative age-related macular degeneration, left eye, stage unspecified: Secondary | ICD-10-CM | POA: Insufficient documentation

## 2016-05-03 DIAGNOSIS — E890 Postprocedural hypothyroidism: Secondary | ICD-10-CM | POA: Insufficient documentation

## 2016-05-03 DIAGNOSIS — E1129 Type 2 diabetes mellitus with other diabetic kidney complication: Secondary | ICD-10-CM

## 2016-05-03 DIAGNOSIS — Z7984 Long term (current) use of oral hypoglycemic drugs: Secondary | ICD-10-CM | POA: Insufficient documentation

## 2016-05-03 DIAGNOSIS — Z885 Allergy status to narcotic agent status: Secondary | ICD-10-CM | POA: Insufficient documentation

## 2016-05-03 MED ORDER — SODIUM CHLORIDE 0.9 % IV BOLUS (SEPSIS)
1000.0000 mL | Freq: Once | INTRAVENOUS | Status: AC
  Administered 2016-05-04: 1000 mL via INTRAVENOUS

## 2016-05-03 NOTE — ED Triage Notes (Addendum)
Pt to triage via w/c with no distress; c/o N/V and not feeling well today; daughter st found her on the floor with confusion & weakness; pt taken to room 15 and placed on card monitor for EKG & further evaluation

## 2016-05-03 NOTE — ED Provider Notes (Signed)
[facility] Emergency Department Provider Note   ____________________________________________   First MD Initiated Contact with Patient 05/03/16 2348     (approximate)  I have reviewed the triage vital signs and the nursing notes.   HISTORY  Chief Complaint Weakness   HPI Alexis Tucker is a 75 y.o. female who presents to the ED from home with a chief complaint of generalized weakness, nausea/vomiting and fall. Patient reports not feeling well for the past 2 days; yesterday had nausea and vomiting multiple times. Tonight states she was trying to answer the telephone and go to the restroom, got out of bed, lost her balance and fell. Denies head injury or LOC. Daughter feels she is more confused than usual. Patient complains of lightheadedness and dizziness. Denies fever, chills, chest pain, shortness of breath, abdominal pain, diarrhea. Last bowel movement yesterday which was normal for patient. Denies recent travel or trauma. Nothing makes her symptoms better or worse.   Past Medical History:  Diagnosis Date  . Anemia    per prior pcp  . Carotid stenosis, asymptomatic 01/2014   mild ICA, severe in ECA, rec rpt [place] 2 years  . Cataracts, bilateral   . CKD (chronic kidney disease) stage 4, GFR 15-29 ml/min (HCC) 01/2012   Cr 3.85, in 3s since 2010, discussing HD and L forearm graft ([place])  . Diabetes type 2, controlled ([place]) 2000s  . Diabetic retinopathy with macular edema ([place]) 03/2014   [place] at [facility] eye on avastin  . DJD (degenerative joint disease)    per prior pcp  . GERD (gastroesophageal reflux disease)   . Glaucoma 03/2014  . History of breast cancer 2008   infiltrating ductal carcinoma s/p mammosite  . History of colon cancer 2006   s/p colectomy  . History of hepatitis A 1990s   from food, resolved  . HTN (hypertension)   . Macular degeneration    wet R with vision loss, dry L; to see retina specialist  . Neuropathy ([place])    per prior pcp  . OA (osteoarthritis)    knees and hips  . Osteonecrosis (HCC)    hips?  . Post-surgical hypothyroidism     Patient Active Problem List   Diagnosis Date Noted  . Nausea and vomiting 05/04/2016  . Assistance needed with transportation 07/31/2015  . Advanced care planning/counseling discussion 07/27/2015  . Lower back pain 03/14/2014  . Hip pain, bilateral 03/14/2014  . Macular degeneration   . Diabetic retinopathy with macular edema ([place]) 03/03/2014  . Medicare annual wellness visit, subsequent 01/07/2013  . Chronic kidney disease, stage V ([place]) 01/06/2013  . Carotid stenosis, asymptomatic 12/05/2012  . Post-surgical hypothyroidism   . OA (osteoarthritis)   . Type 2 diabetes mellitus, uncontrolled, with renal complications ([place])   . HTN (hypertension)   . Osteonecrosis ([place])   . History of breast cancer   . History of colon cancer   . GERD (gastroesophageal reflux disease)     Past Surgical History:  Procedure Laterality Date  . ABDOMINAL HYSTERECTOMY  1976   after tubal pregnancy, h/o fibroids with heavy bleeding  . BREAST MAMMOSITE  2008  . carotid ultrasound  12/2012   1-39% bilateral stenosis, severe in ECA  . CHOLECYSTECTOMY  2005  . COLECTOMY  2005   colon cancer  . COLON SURGERY  2006   bowel obstruction  . COLONOSCOPY  2008   ext hem, ileo-colonic anastomosis in cecum, tubular adenoma x1, rec rpt 1 yr for multiple polyps (  in [place])  . THYROIDECTOMY, PARTIAL     unclear why  . TOTAL KNEE ARTHROPLASTY Right 1999  . US ECHOCARDIOGRAPHY  11/2007   nl LV fxn EF 60%, diastolic dysfunction, LVH, tr TR and MR, slcerotic aortic valve    Prior to Admission medications   Medication Sig Start Date End Date Taking? Authorizing Provider  acetaminophen (TYLENOL) 500 MG tablet Take 1,000 mg by mouth every 12 (twelve) hours as needed.   Yes Historical Provider, MD  allopurinol (ZYLOPRIM) 100 MG tablet TAKE 1 TABLET BY MOUTH DAILY 07/30/15  Yes Ria Bush, MD   anastrozole (ARIMIDEX) 1 MG tablet TAKE 1 TABLET BY MOUTH DAILY 01/11/16  Yes Ria Bush, MD  ARMOUR THYROID 60 MG tablet TAKE 1 TABLET BY MOUTH DAILY -NEED PHYSICAL 07/30/15  Yes Ria Bush, MD  Blood Glucose Monitoring Suppl [facility] BLOOD GLUCOSE METER) W/DEVICE KIT Use as directed to check sugars daily 250.42 03/13/14  Yes Ria Bush, MD  Calcium-Magnesium-Vitamin D (CALCIUM MAGNESIUM PO) Take 1 tablet by mouth daily.   Yes Historical Provider, MD  carvedilol (COREG) 25 MG tablet TAKE 1 TABLET BY MOUTH DAILY 11/24/15  Yes Ria Bush, MD  Folic Acid-Vit Y3-KZS W10 (FOLBEE) 2.5-25-1 MG TABS tablet Take 1 tablet by mouth daily.   Yes Historical Provider, MD  glimepiride (AMARYL) 1 MG tablet TAKE ONE TABLET BY MOUTH EACH MORNING BEFORE BREAKFAST -- NEED PHYSICAL 07/30/15  Yes Ria Bush, MD  hydrALAZINE (APRESOLINE) 50 MG tablet Take 1 tablet (50 mg total) by mouth daily. 03/14/14  Yes Ria Bush, MD  isosorbide dinitrate (ISORDIL) 20 MG tablet TAKE 1 TABLET BY MOUTH DAILY 07/30/15  Yes Ria Bush, MD  latanoprost (XALATAN) 0.005 % ophthalmic solution Place 1 drop into both eyes at bedtime.   Yes Historical Provider, MD  ONGLYZA 5 MG TABS tablet TAKE 1 TABLET BY MOUTH DAILY 07/30/15  Yes Ria Bush, MD  pentoxifylline (TRENTAL) 400 MG CR tablet TAKE 1 TABLET BY MOUTH DAILY 10/20/15  Yes Ria Bush, MD  Cholecalciferol (VITAMIN D-3) 5000 UNITS TABS Take 1 tablet by mouth daily.    Historical Provider, MD  FERROUS GLUCONATE PO Take 28 mg by mouth daily.    Historical Provider, MD    Allergies Codeine; Tramadol; and Sulfa antibiotics  Family History  Problem Relation Age of Onset  . Diabetes Mother   . Hypertension Mother   . Arthritis Mother   . Alcohol abuse Father   . Arthritis Father   . Stroke Daughter   . CAD Neg Hx   . Cancer Neg Hx     Social History Social History  Substance Use Topics  . Smoking status: Former Research scientist (life sciences)  . Smokeless  tobacco: Never Used  . Alcohol use No     Comment: rare    Review of Systems  Constitutional: Positive for generalized weakness and malaise. No fever/chills. Eyes: No visual changes. ENT: No sore throat. Cardiovascular: Denies chest pain. Respiratory: Denies shortness of breath. Gastrointestinal: No abdominal pain.  Positive for nausea and vomiting.  No diarrhea.  No constipation. Genitourinary: Negative for dysuria. Musculoskeletal: Negative for back pain. Skin: Negative for rash. Neurological: Negative for headaches, focal weakness or numbness.  10-point ROS otherwise negative.  ____________________________________________   PHYSICAL EXAM:  VITAL SIGNS: ED Triage Vitals  Enc Vitals Group     BP 05/03/16 2335 (!) 163/75     Pulse Rate 05/03/16 2335 82     Resp 05/03/16 2335 18     Temp 05/03/16 2335  98.7 F (37.1 C)     Temp Source 05/03/16 2335 Oral     SpO2 05/03/16 2335 98 %     Weight 05/03/16 2333 192 lb (87.1 kg)     Height 05/03/16 2333 5\' 4"  (1.626 m)     Head Circumference --      Peak Flow --      Pain Score --      Pain Loc --      Pain Edu? --      Excl. in GC? --     Constitutional: Alert and oriented. Well appearing and in mild acute distress. Eyes: Conjunctivae are normal. PERRL. EOMI. Head: Atraumatic. Nose: No congestion/rhinnorhea. Mouth/Throat: Mucous membranes are mildly dry.  Oropharynx non-erythematous. Neck: No stridor.   Cardiovascular: Normal rate, regular rhythm. Grossly normal heart sounds.  Good peripheral circulation. Respiratory: Normal respiratory effort.  No retractions. Lungs CTAB. Gastrointestinal: Soft and nontender to light or deep palpation. No distention. No abdominal bruits. No CVA tenderness. Musculoskeletal: No lower extremity tenderness nor edema.  No joint effusions. Neurologic:  Normal speech and language. No gross focal neurologic deficits are appreciated.  Skin:  Skin is warm, dry and intact. No rash  noted. Psychiatric: Mood and affect are normal. Speech and behavior are normal.  ____________________________________________   LABS (all labs ordered are listed, but only abnormal results are displayed)  Labs Reviewed  CBC WITH DIFFERENTIAL/PLATELET - Abnormal; Notable for the following:       Result Value   RDW 15.0 (*)    Platelets 119 (*)    Neutro Abs 8.0 (*)    Monocytes Absolute 1.1 (*)    All other components within normal limits  COMPREHENSIVE METABOLIC PANEL - Abnormal; Notable for the following:    CO2 20 (*)    Glucose, Bld 177 (*)    BUN 43 (*)    Creatinine, Ser 4.15 (*)    GFR calc non Af Amer 10 (*)    GFR calc Af Amer 11 (*)    All other components within normal limits  TROPONIN I - Abnormal; Notable for the following:    Troponin I 0.62 (*)    All other components within normal limits  URINALYSIS COMPLETEWITH MICROSCOPIC ([facility] ONLY) - Abnormal; Notable for the following:    Color, Urine YELLOW (*)    APPearance CLEAR (*)    Glucose, UA >500 (*)    Hgb urine dipstick 2+ (*)    Protein, ur 100 (*)    Bacteria, UA RARE (*)    Squamous Epithelial / LPF 0-5 (*)    All other components within normal limits  TROPONIN I  TROPONIN I  TROPONIN I  TSH  HEMOGLOBIN A1C   ____________________________________________  EKG  ED ECG REPORT I, Dylanie Quesenberry J, the attending physician, personally viewed and interpreted this ECG.   Date: 05/04/2016  EKG Time: 2347  Rate: 84  Rhythm: normal EKG, normal sinus rhythm  Axis: Normal  Intervals:none  ST&T Change: Nonspecific  ____________________________________________  RADIOLOGY  Portable CXR (viewed by me, interpreted per Dr. 2348): Elevation of the right hemidiaphragm. Lungs remain grossly clear.  CT Abdomen Pelvis Interpreted per Dr. Cherly Hensen: 1. No acute abnormality seen to explain the patient's symptoms.  2. Dilatation of the pancreatic duct to 7 mm in maximal diameter, of  uncertain significance. Distal  obstruction cannot be excluded. MRCP  or ERCP could be considered for further evaluation, as deemed  clinically appropriate.  3. Calcification at the mitral and aortic  valves. Mild coronary  artery calcification noted.  4. Scattered aortic atherosclerosis.  5. Mild extension of multiple small and large bowel loops into a  broad-based anterior abdominal wall bulge. Visualized bowel loops  are otherwise unremarkable.   ____________________________________________   PROCEDURES  Procedure(s) performed: None  Procedures  Critical Care performed: No  ____________________________________________   INITIAL IMPRESSION / ASSESSMENT AND PLAN / ED COURSE  Pertinent labs & imaging results that were available during my care of the patient were reviewed by me and considered in my medical decision making (see chart for details).  75 year old female who presents with generalized weakness, nausea/vomiting and dizziness. Will obtain screening lab work, initiate IV fluid resuscitation, IV antiemetic and reassess.  Clinical Course  Comment By Time  Noted elevation of right hemidiaphragm on chest x-ray. Patient has no prior for comparison. CT ordered to evaluate for intra-abdominal pathology which may be elevating right hemidiaphragm. Paulette Blanch, MD 11/02 [phone]  Updated patient and daughter of CT imaging results. Discussed with hospitalist to evaluate patient in the emergency department for admission. Paulette Blanch, MD 11/02 0350     ____________________________________________   FINAL CLINICAL IMPRESSION(S) / ED DIAGNOSES  Final diagnoses:  Weakness  Elevated troponin  Stage 5 chronic kidney disease not on chronic dialysis ([place])      NEW MEDICATIONS STARTED DURING THIS VISIT:  New Prescriptions   No medications on file     Note:  This document was prepared using Dragon voice recognition software and may include unintentional dictation errors.    Paulette Blanch, MD 05/04/16  [phone]

## 2016-05-04 ENCOUNTER — Observation Stay (HOSPITAL_BASED_OUTPATIENT_CLINIC_OR_DEPARTMENT_OTHER)
Admit: 2016-05-04 | Discharge: 2016-05-04 | Disposition: A | Discharge status: Home / Self Care | Payer: Medicare Other | Attending: Internal Medicine | Admitting: Internal Medicine

## 2016-05-04 ENCOUNTER — Emergency Department: Payer: Medicare Other

## 2016-05-04 ENCOUNTER — Encounter: Payer: Self-pay | Admitting: Internal Medicine

## 2016-05-04 DIAGNOSIS — K529 Noninfective gastroenteritis and colitis, unspecified: Secondary | ICD-10-CM | POA: Diagnosis not present

## 2016-05-04 DIAGNOSIS — R778 Other specified abnormalities of plasma proteins: Secondary | ICD-10-CM | POA: Diagnosis present

## 2016-05-04 DIAGNOSIS — R079 Chest pain, unspecified: Secondary | ICD-10-CM | POA: Diagnosis not present

## 2016-05-04 DIAGNOSIS — R1110 Vomiting, unspecified: Secondary | ICD-10-CM | POA: Diagnosis not present

## 2016-05-04 DIAGNOSIS — R748 Abnormal levels of other serum enzymes: Secondary | ICD-10-CM | POA: Diagnosis not present

## 2016-05-04 DIAGNOSIS — R7989 Other specified abnormal findings of blood chemistry: Secondary | ICD-10-CM

## 2016-05-04 DIAGNOSIS — R531 Weakness: Secondary | ICD-10-CM | POA: Diagnosis not present

## 2016-05-04 DIAGNOSIS — R112 Nausea with vomiting, unspecified: Secondary | ICD-10-CM | POA: Diagnosis present

## 2016-05-04 LAB — ECHOCARDIOGRAM COMPLETE
EERAT: 12.52
EWDT: 331 ms
FS: 34 % (ref 28–44)
HEIGHTINCHES: 64 in
IV/PV OW: 1.33
LA diam end sys: 49 mm
LA vol A4C: 70.8 ml
LA vol index: 34.3 mL/m2
LA vol: 69.2 mL
LADIAMINDEX: 2.43 cm/m2
LASIZE: 49 mm
LDCA: 3.8 cm2
LV PW d: 10.6 mm — AB (ref 0.6–1.1)
LV TDI E'LATERAL: 6.2
LV TDI E'MEDIAL: 5.33
LV e' LATERAL: 6.2 cm/s
LVEEAVG: 12.52
LVEEMED: 12.52
LVOTD: 22 mm
MV Dec: 331
MV Peak grad: 2 mmHg
MV pk A vel: 115 m/s
MVPKEVEL: 77.6 m/s
RV LATERAL S' VELOCITY: 13.7 cm/s
TAPSE: 22.2 mm
WEIGHTICAEL: 3072 [oz_av]

## 2016-05-04 LAB — CBC WITH DIFFERENTIAL/PLATELET
BASOS PCT: 0 %
Basophils Absolute: 0 10*3/uL (ref 0–0.1)
EOS ABS: 0 10*3/uL (ref 0–0.7)
EOS PCT: 0 %
HCT: 36.6 % (ref 35.0–47.0)
HEMOGLOBIN: 12.2 g/dL (ref 12.0–16.0)
LYMPHS ABS: 1.2 10*3/uL (ref 1.0–3.6)
Lymphocytes Relative: 12 %
MCH: 29.5 pg (ref 26.0–34.0)
MCHC: 33.3 g/dL (ref 32.0–36.0)
MCV: 88.7 fL (ref 80.0–100.0)
MONOS PCT: 10 %
Monocytes Absolute: 1.1 10*3/uL — ABNORMAL HIGH (ref 0.2–0.9)
NEUTROS PCT: 78 %
Neutro Abs: 8 10*3/uL — ABNORMAL HIGH (ref 1.4–6.5)
PLATELETS: 119 10*3/uL — AB (ref 150–440)
RBC: 4.13 MIL/uL (ref 3.80–5.20)
RDW: 15 % — AB (ref 11.5–14.5)
WBC: 10.4 10*3/uL (ref 3.6–11.0)

## 2016-05-04 LAB — COMPREHENSIVE METABOLIC PANEL
ALBUMIN: 4 g/dL (ref 3.5–5.0)
ALK PHOS: 86 U/L (ref 38–126)
ALT: 15 U/L (ref 14–54)
ANION GAP: 12 (ref 5–15)
AST: 39 U/L (ref 15–41)
BUN: 43 mg/dL — ABNORMAL HIGH (ref 6–20)
CALCIUM: 9.4 mg/dL (ref 8.9–10.3)
CHLORIDE: 108 mmol/L (ref 101–111)
CO2: 20 mmol/L — AB (ref 22–32)
Creatinine, Ser: 4.15 mg/dL — ABNORMAL HIGH (ref 0.44–1.00)
GFR calc non Af Amer: 10 mL/min — ABNORMAL LOW (ref >60)
GFR, EST AFRICAN AMERICAN: 11 mL/min — AB (ref >60)
GLUCOSE: 177 mg/dL — AB (ref 65–99)
POTASSIUM: 4.8 mmol/L (ref 3.5–5.1)
SODIUM: 140 mmol/L (ref 135–145)
Total Bilirubin: 0.8 mg/dL (ref 0.3–1.2)
Total Protein: 7.8 g/dL (ref 6.5–8.1)

## 2016-05-04 LAB — GLUCOSE, CAPILLARY
Glucose-Capillary: 58 mg/dL — ABNORMAL LOW (ref 65–99)
Glucose-Capillary: 65 mg/dL (ref 65–99)
Glucose-Capillary: 67 mg/dL (ref 65–99)
Glucose-Capillary: 82 mg/dL (ref 65–99)
Glucose-Capillary: 100 mg/dL — ABNORMAL HIGH (ref 65–99)
Glucose-Capillary: 150 mg/dL — ABNORMAL HIGH (ref 65–99)
Glucose-Capillary: 143 mg/dL — ABNORMAL HIGH (ref 65–99)

## 2016-05-04 LAB — URINALYSIS COMPLETE WITH MICROSCOPIC (ARMC ONLY)
Bilirubin Urine: NEGATIVE
Ketones, ur: NEGATIVE mg/dL
LEUKOCYTES UA: NEGATIVE
NITRITE: NEGATIVE
PH: 6 (ref 5.0–8.0)
Protein, ur: 100 mg/dL — AB
SPECIFIC GRAVITY, URINE: 1.012 (ref 1.005–1.030)

## 2016-05-04 LAB — TROPONIN I
Troponin I: 0.62 ng/mL (ref <0.03)
Troponin I: 0.6 ng/mL (ref <0.03)
Troponin I: 0.47 ng/mL (ref <0.03)
Troponin I: 0.37 ng/mL (ref <0.03)

## 2016-05-04 LAB — TSH: TSH: 1.243 u[IU]/mL (ref 0.350–4.500)

## 2016-05-04 MED ORDER — IOPAMIDOL (ISOVUE-300) INJECTION 61%
30.0000 mL | Freq: Once | INTRAVENOUS | Status: AC | PRN
  Administered 2016-05-04: 30 mL via ORAL

## 2016-05-04 MED ORDER — ACETAMINOPHEN 650 MG RE SUPP: 650.0000 mg | Freq: Four times a day (QID) | RECTAL | Status: DC | PRN

## 2016-05-04 MED ORDER — ISOSORBIDE DINITRATE 20 MG PO TABS
20.0000 mg | ORAL_TABLET | Freq: Two times a day (BID) | ORAL | Status: DC
  Administered 2016-05-04 – 2016-05-08 (×9): 20 mg via ORAL
  Filled 2016-05-04 (×10): qty 1

## 2016-05-04 MED ORDER — INSULIN ASPART 100 UNIT/ML ~~LOC~~ SOLN: 0.0000 [IU] | Freq: Every day | SUBCUTANEOUS | Status: DC

## 2016-05-04 MED ORDER — ALLOPURINOL 100 MG PO TABS
100.0000 mg | ORAL_TABLET | Freq: Every day | ORAL | Status: DC
  Administered 2016-05-04 – 2016-05-08 (×5): 100 mg via ORAL
  Filled 2016-05-04 (×5): qty 1

## 2016-05-04 MED ORDER — HYDRALAZINE HCL 50 MG PO TABS
50.0000 mg | ORAL_TABLET | Freq: Three times a day (TID) | ORAL | Status: DC
  Administered 2016-05-04 – 2016-05-08 (×8): 50 mg via ORAL
  Filled 2016-05-04 (×9): qty 1

## 2016-05-04 MED ORDER — INSULIN ASPART 100 UNIT/ML ~~LOC~~ SOLN
0.0000 [IU] | Freq: Three times a day (TID) | SUBCUTANEOUS | Status: DC
  Administered 2016-05-04: 17:00:00 1 [IU] via SUBCUTANEOUS
  Administered 2016-05-05: 17:00:00 2 [IU] via SUBCUTANEOUS
  Administered 2016-05-07 (×2): 1 [IU] via SUBCUTANEOUS
  Administered 2016-05-08: 2 [IU] via SUBCUTANEOUS
  Filled 2016-05-04: qty 2
  Filled 2016-05-04 (×2): qty 1
  Filled 2016-05-04: qty 2
  Filled 2016-05-04: qty 1

## 2016-05-04 MED ORDER — ASPIRIN 81 MG PO CHEW
324.0000 mg | CHEWABLE_TABLET | Freq: Once | ORAL | Status: AC
  Administered 2016-05-04: 324 mg via ORAL
  Filled 2016-05-04: qty 4

## 2016-05-04 MED ORDER — ONDANSETRON HCL 4 MG/2ML IJ SOLN
4.0000 mg | Freq: Once | INTRAMUSCULAR | Status: AC
  Administered 2016-05-04: 4 mg via INTRAVENOUS
  Filled 2016-05-04: qty 2

## 2016-05-04 MED ORDER — RENA-VITE PO TABS
1.0000 | ORAL_TABLET | Freq: Every day | ORAL | Status: DC
  Administered 2016-05-05 – 2016-05-08 (×4): 1 via ORAL
  Filled 2016-05-04 (×5): qty 1

## 2016-05-04 MED ORDER — ANASTROZOLE 1 MG PO TABS
1.0000 mg | ORAL_TABLET | Freq: Every day | ORAL | Status: DC
  Administered 2016-05-04 – 2016-05-08 (×5): 1 mg via ORAL
  Filled 2016-05-04 (×6): qty 1

## 2016-05-04 MED ORDER — FERROUS GLUCONATE 324 (38 FE) MG PO TABS
324.0000 mg | ORAL_TABLET | Freq: Every day | ORAL | Status: DC
  Administered 2016-05-04 – 2016-05-08 (×4): 324 mg via ORAL
  Filled 2016-05-04 (×6): qty 1

## 2016-05-04 MED ORDER — VITAMIN D 1000 UNITS PO TABS
5000.0000 [IU] | ORAL_TABLET | Freq: Every day | ORAL | Status: DC
  Administered 2016-05-04 – 2016-05-08 (×5): 5000 [IU] via ORAL
  Filled 2016-05-04 (×5): qty 5

## 2016-05-04 MED ORDER — SODIUM CHLORIDE 0.9 % IV SOLN
INTRAVENOUS | Status: DC
  Administered 2016-05-04 – 2016-05-07 (×5): via INTRAVENOUS

## 2016-05-04 MED ORDER — FOLIC ACID-VIT B6-VIT B12 2.5-25-1 MG PO TABS: 1.0000 | ORAL_TABLET | Freq: Every day | ORAL | Status: DC

## 2016-05-04 MED ORDER — ONDANSETRON HCL 4 MG/2ML IJ SOLN
INTRAMUSCULAR | Status: AC
  Filled 2016-05-04: qty 2

## 2016-05-04 MED ORDER — LATANOPROST 0.005 % OP SOLN
1.0000 [drp] | Freq: Every day | OPHTHALMIC | Status: DC
  Administered 2016-05-04 – 2016-05-07 (×4): 1 [drp] via OPHTHALMIC
  Filled 2016-05-04: qty 2.5

## 2016-05-04 MED ORDER — ACETAMINOPHEN 325 MG PO TABS: 650.0000 mg | ORAL_TABLET | Freq: Four times a day (QID) | ORAL | Status: DC | PRN

## 2016-05-04 MED ORDER — CARVEDILOL 25 MG PO TABS
25.0000 mg | ORAL_TABLET | Freq: Every day | ORAL | Status: DC
  Administered 2016-05-04 – 2016-05-08 (×5): 25 mg via ORAL
  Filled 2016-05-04 (×5): qty 1

## 2016-05-04 MED ORDER — ONDANSETRON HCL 4 MG/2ML IJ SOLN
4.0000 mg | Freq: Once | INTRAMUSCULAR | Status: AC
  Administered 2016-05-04: 4 mg via INTRAVENOUS

## 2016-05-04 MED ORDER — ONDANSETRON HCL 4 MG PO TABS: 4.0000 mg | ORAL_TABLET | Freq: Four times a day (QID) | ORAL | Status: DC | PRN

## 2016-05-04 MED ORDER — FERROUS GLUCONATE 324 (38 FE) MG PO TABS: 28.0000 mg | ORAL_TABLET | Freq: Every day | ORAL | Status: DC

## 2016-05-04 MED ORDER — HYDRALAZINE HCL 50 MG PO TABS
50.0000 mg | ORAL_TABLET | Freq: Every day | ORAL | Status: DC
  Administered 2016-05-04: 11:00:00 50 mg via ORAL
  Filled 2016-05-04: qty 1

## 2016-05-04 MED ORDER — LINAGLIPTIN 5 MG PO TABS
5.0000 mg | ORAL_TABLET | Freq: Every day | ORAL | Status: DC
  Administered 2016-05-04 – 2016-05-08 (×5): 5 mg via ORAL
  Filled 2016-05-04 (×5): qty 1

## 2016-05-04 MED ORDER — DOCUSATE SODIUM 100 MG PO CAPS
100.0000 mg | ORAL_CAPSULE | Freq: Two times a day (BID) | ORAL | Status: DC
  Administered 2016-05-04 – 2016-05-08 (×9): 100 mg via ORAL
  Filled 2016-05-04 (×9): qty 1

## 2016-05-04 MED ORDER — THYROID 30 MG PO TABS
60.0000 mg | ORAL_TABLET | Freq: Every day | ORAL | Status: DC
  Administered 2016-05-04 – 2016-05-08 (×4): 60 mg via ORAL
  Filled 2016-05-04 (×2): qty 2
  Filled 2016-05-04: qty 1
  Filled 2016-05-04 (×3): qty 2

## 2016-05-04 MED ORDER — HEPARIN SODIUM (PORCINE) 5000 UNIT/ML IJ SOLN
5000.0000 [IU] | Freq: Three times a day (TID) | INTRAMUSCULAR | Status: DC
  Administered 2016-05-04 – 2016-05-07 (×9): 5000 [IU] via SUBCUTANEOUS
  Filled 2016-05-04 (×9): qty 1

## 2016-05-04 MED ORDER — CEFTRIAXONE SODIUM 1 G IJ SOLR
1.0000 g | INTRAMUSCULAR | Status: DC
  Filled 2016-05-04: qty 10

## 2016-05-04 MED ORDER — PROCHLORPERAZINE EDISYLATE 5 MG/ML IJ SOLN
10.0000 mg | Freq: Four times a day (QID) | INTRAMUSCULAR | Status: DC | PRN
  Administered 2016-05-04 – 2016-05-06 (×3): 10 mg via INTRAVENOUS
  Filled 2016-05-04 (×3): qty 2

## 2016-05-04 MED ORDER — CEFTRIAXONE SODIUM-DEXTROSE 1-3.74 GM-% IV SOLR
1.0000 g | INTRAVENOUS | Status: DC
  Administered 2016-05-04: 1 g via INTRAVENOUS
  Filled 2016-05-04: qty 50

## 2016-05-04 MED ORDER — VITAMIN D-3 125 MCG (5000 UT) PO TABS: 1.0000 | ORAL_TABLET | Freq: Every day | ORAL | Status: DC

## 2016-05-04 MED ORDER — ASPIRIN EC 81 MG PO TBEC
81.0000 mg | DELAYED_RELEASE_TABLET | Freq: Every day | ORAL | Status: DC
  Administered 2016-05-05 – 2016-05-08 (×4): 81 mg via ORAL
  Filled 2016-05-04 (×4): qty 1

## 2016-05-04 MED ORDER — NITROGLYCERIN 2 % TD OINT
0.5000 [in_us] | TOPICAL_OINTMENT | Freq: Once | TRANSDERMAL | Status: AC
  Administered 2016-05-04: 0.5 [in_us] via TOPICAL
  Filled 2016-05-04: qty 1

## 2016-05-04 MED ORDER — PENTOXIFYLLINE ER 400 MG PO TBCR
400.0000 mg | EXTENDED_RELEASE_TABLET | Freq: Every day | ORAL | Status: DC
  Administered 2016-05-04 – 2016-05-08 (×5): 400 mg via ORAL
  Filled 2016-05-04 (×5): qty 1

## 2016-05-04 MED ORDER — MECLIZINE HCL 12.5 MG PO TABS: 25.0000 mg | ORAL_TABLET | Freq: Three times a day (TID) | ORAL | Status: DC | PRN

## 2016-05-04 MED ORDER — ONDANSETRON HCL 4 MG/2ML IJ SOLN
4.0000 mg | Freq: Four times a day (QID) | INTRAMUSCULAR | Status: DC | PRN
  Administered 2016-05-04 – 2016-05-05 (×2): 4 mg via INTRAVENOUS
  Filled 2016-05-04 (×2): qty 2

## 2016-05-04 NOTE — H&P (Signed)
Alexis Tucker is an 75 y.o. female.   Chief Complaint: Nausea and vomiting HPI: The patient with past medical history of chronic kidney disease and diabetes mellitus presents to the emergency department complaining of nausea and vomiting. She states that she's had multiple episodes of nonbloody nonbilious emesis over the last 2 days. She also reports feeling very weak. Upon arrival the patient's blood pressure was very elevated. Laboratory evaluation revealed elevated creatinine at baseline as well as elevated troponin. The patient denies chest pain or shortness of breath but admits to feeling tightness across her chest and abdomen. Nitropaste was placed on her chest which made her feel better. Due to her elevated troponin and continued weakness in the emergency department staff called for admission.  Past Medical History:  Diagnosis Date  . Anemia    per prior pcp  . Carotid stenosis, asymptomatic 01/2014   mild ICA, severe in ECA, rec rpt [place] 2 years  . Cataracts, bilateral   . CKD (chronic kidney disease) stage 4, GFR 15-29 ml/min (HCC) 01/2012   Cr 3.85, in 3s since 2010, discussing HD and L forearm graft ([facility])  . Diabetes type 2, controlled ([place]) 2000s  . Diabetic retinopathy with macular edema ([place]) 03/2014   [place] at [facility] eye on avastin  . DJD (degenerative joint disease)    per prior pcp  . GERD (gastroesophageal reflux disease)   . Glaucoma 03/2014  . History of breast cancer 2008   infiltrating ductal carcinoma s/p mammosite  . History of colon cancer 2006   s/p colectomy  . History of hepatitis A 1990s   from food, resolved  . HTN (hypertension)   . Macular degeneration    wet R with vision loss, dry L; to see retina specialist  . Neuropathy ([place])    per prior pcp  . OA (osteoarthritis)    knees and hips  . Osteonecrosis (HCC)    hips?  . Post-surgical hypothyroidism     Past Surgical History:  Procedure Laterality Date  . ABDOMINAL HYSTERECTOMY  1976   after tubal pregnancy, h/o fibroids with heavy bleeding  . BREAST MAMMOSITE  2008  . carotid ultrasound  12/2012   1-39% bilateral stenosis, severe in ECA  . CHOLECYSTECTOMY  2005  . COLECTOMY  2005   colon cancer  . COLON SURGERY  2006   bowel obstruction  . COLONOSCOPY  2008   ext hem, ileo-colonic anastomosis in cecum, tubular adenoma x1, rec rpt 1 yr for multiple polyps (in [place])  . THYROIDECTOMY, PARTIAL     unclear why  . TOTAL KNEE ARTHROPLASTY Right 1999  . US ECHOCARDIOGRAPHY  11/2007   nl LV fxn EF 07%, diastolic dysfunction, LVH, tr TR and MR, slcerotic aortic valve    Family History  Problem Relation Age of Onset  . Diabetes Mother   . Hypertension Mother   . Arthritis Mother   . Alcohol abuse Father   . Arthritis Father   . Stroke Daughter   . CAD Neg Hx   . Cancer Neg Hx    Social History:  reports that she has quit smoking. She has never used smokeless tobacco. She reports that she does not drink alcohol or use drugs.  Allergies:  Allergies  Allergen Reactions  . Codeine   . Tramadol Other (See Comments)    nightmares  . Sulfa Antibiotics Itching and Rash     (Not in a hospital admission)  Results for orders placed or performed during the hospital encounter  of 05/03/16 (from the past 48 hour(s))  CBC with Differential     Status: Abnormal   Collection Time: 05/04/16 12:10 AM  Result Value Ref Range   WBC 10.4 3.6 - 11.0 K/uL   RBC 4.13 3.80 - 5.20 MIL/uL   Hemoglobin 12.2 12.0 - 16.0 g/dL   HCT 36.6 35.0 - 47.0 %   MCV 88.7 80.0 - 100.0 fL   MCH 29.5 26.0 - 34.0 pg   MCHC 33.3 32.0 - 36.0 g/dL   RDW 15.0 (H) 11.5 - 14.5 %   Platelets 119 (L) 150 - 440 K/uL   Neutrophils Relative % 78 %   Neutro Abs 8.0 (H) 1.4 - 6.5 K/uL   Lymphocytes Relative 12 %   Lymphs Abs 1.2 1.0 - 3.6 K/uL   Monocytes Relative 10 %   Monocytes Absolute 1.1 (H) 0.2 - 0.9 K/uL   Eosinophils Relative 0 %   Eosinophils Absolute 0.0 0 - 0.7 K/uL   Basophils Relative 0 %    Basophils Absolute 0.0 0 - 0.1 K/uL  Comprehensive metabolic panel     Status: Abnormal   Collection Time: 05/04/16 12:10 AM  Result Value Ref Range   Sodium 140 135 - 145 mmol/L   Potassium 4.8 3.5 - 5.1 mmol/L   Chloride 108 101 - 111 mmol/L   CO2 20 (L) 22 - 32 mmol/L   Glucose, Bld 177 (H) 65 - 99 mg/dL   BUN 43 (H) 6 - 20 mg/dL   Creatinine, Ser 4.15 (H) 0.44 - 1.00 mg/dL   Calcium 9.4 8.9 - 10.3 mg/dL   Total Protein 7.8 6.5 - 8.1 g/dL   Albumin 4.0 3.5 - 5.0 g/dL   AST 39 15 - 41 U/L   ALT 15 14 - 54 U/L   Alkaline Phosphatase 86 38 - 126 U/L   Total Bilirubin 0.8 0.3 - 1.2 mg/dL   GFR calc non Af Amer 10 (L) >60 mL/min   GFR calc Af Amer 11 (L) >60 mL/min    Comment: (NOTE) The eGFR has been calculated using the CKD EPI equation. This calculation has not been validated in all clinical situations. eGFR's persistently <60 mL/min signify possible Chronic Kidney Disease.    Anion gap 12 5 - 15  Troponin I     Status: Abnormal   Collection Time: 05/04/16 12:10 AM  Result Value Ref Range   Troponin I 0.62 (HH) <0.03 ng/mL    Comment: CRITICAL RESULT CALLED TO, READ BACK BY AND VERIFIED WITH KUCH YUAL ON 05/04/16 AT 0123 BY TLB   Urinalysis complete, with microscopic ([place] only)     Status: Abnormal   Collection Time: 05/04/16  3:01 AM  Result Value Ref Range   Color, Urine YELLOW (A) YELLOW   APPearance CLEAR (A) CLEAR   Glucose, UA >500 (A) NEGATIVE mg/dL   Bilirubin Urine NEGATIVE NEGATIVE   Ketones, ur NEGATIVE NEGATIVE mg/dL   Specific Gravity, Urine 1.012 1.005 - 1.030   Hgb urine dipstick 2+ (A) NEGATIVE   pH 6.0 5.0 - 8.0   Protein, ur 100 (A) NEGATIVE mg/dL   Nitrite NEGATIVE NEGATIVE   Leukocytes, UA NEGATIVE NEGATIVE   RBC / HPF 0-5 0 - 5 RBC/hpf   WBC, UA 0-5 0 - 5 WBC/hpf   Bacteria, UA RARE (A) NONE SEEN   Squamous Epithelial / LPF 0-5 (A) NONE SEEN   Ct Abdomen Pelvis Wo Contrast  Result Date: 05/04/2016 CLINICAL DATA:  Acute onset of nausea and  vomiting. Confusion and generalized weakness. Initial encounter. EXAM: CT ABDOMEN AND PELVIS WITHOUT CONTRAST TECHNIQUE: Multidetector CT imaging of the abdomen and pelvis was performed following the standard protocol without IV contrast. COMPARISON:  Lumbar spine radiographs performed 03/13/2014 FINDINGS: Lower chest: The visualized lung bases are grossly clear. Calcification is noted at the mitral and aortic valves. Mild coronary artery calcification is seen. Hepatobiliary: A small calcified granuloma is noted at the right hepatic dome. The patient is status post cholecystectomy, with clips noted along the gallbladder fossa. Pancreas: There is dilatation of the pancreatic duct to 7 mm in maximal diameter, of uncertain significance. Distal obstruction cannot be excluded. MRCP or ERCP could be considered for further evaluation, as deemed clinically appropriate. Spleen: The spleen is unremarkable in appearance. Adrenals/Urinary Tract: The adrenal glands are unremarkable in appearance. The kidneys are within normal limits. There is no evidence of hydronephrosis. No renal or ureteral stones are identified. Mild nonspecific perinephric stranding is noted bilaterally. Stomach/Bowel: Visualized small and large bowel loops are grossly unremarkable, aside from mild extension of multiple small and large bowel loops into a broad-based anterior abdominal wall bulge. Postoperative change is noted about the ascending colon. The stomach is unremarkable in appearance. Vascular/Lymphatic: Scattered calcification is seen along the abdominal aorta and its branches. The abdominal aorta is otherwise grossly unremarkable. The inferior vena cava is grossly unremarkable. No retroperitoneal lymphadenopathy is seen. No pelvic sidewall lymphadenopathy is identified. Reproductive: The bladder is mildly distended and grossly unremarkable. The patient is status post hysterectomy. No suspicious adnexal masses are seen. Other: No additional soft  tissue abnormalities are seen. Musculoskeletal: No acute osseous abnormalities are identified. This endplate sclerotic change is noted at L5-S1, with underlying vacuum phenomenon. Mild underlying facet disease is noted. The visualized musculature is unremarkable in appearance. IMPRESSION: 1. No acute abnormality seen to explain the patient's symptoms. 2. Dilatation of the pancreatic duct to 7 mm in maximal diameter, of uncertain significance. Distal obstruction cannot be excluded. MRCP or ERCP could be considered for further evaluation, as deemed clinically appropriate. 3. Calcification at the mitral and aortic valves. Mild coronary artery calcification noted. 4. Scattered aortic atherosclerosis. 5. Mild extension of multiple small and large bowel loops into a broad-based anterior abdominal wall bulge. Visualized bowel loops are otherwise unremarkable. Electronically Signed   By: Garald Balding M.D.   On: 05/04/2016 03:23   Dg Chest Port 1 View  Result Date: 05/04/2016 CLINICAL DATA:  Acute onset of generalized weakness. Nausea and vomiting. Found on floor. Confusion. Initial encounter. EXAM: PORTABLE CHEST 1 VIEW COMPARISON:  None. FINDINGS: The lungs are well-aerated. There is elevation of the right hemidiaphragm. There is no evidence of focal opacification, pleural effusion or pneumothorax. The cardiomediastinal silhouette is within normal limits. No acute osseous abnormalities are seen. IMPRESSION: Elevation of the right hemidiaphragm.  Lungs remain grossly clear. Electronically Signed   By: Garald Balding M.D.   On: 05/04/2016 00:23    Review of Systems  Constitutional: Negative for chills and fever.  HENT: Negative for sore throat and tinnitus.   Eyes: Negative for blurred vision and redness.  Respiratory: Negative for cough and shortness of breath.   Cardiovascular: Negative for chest pain, palpitations, orthopnea and PND.  Gastrointestinal: Positive for nausea and vomiting. Negative for  abdominal pain and diarrhea.  Genitourinary: Negative for dysuria, frequency and urgency.  Musculoskeletal: Negative for joint pain and myalgias.  Skin: Negative for rash.       No lesions  Neurological: Negative for speech  change, focal weakness and weakness.  Endo/Heme/Allergies: Does not bruise/bleed easily.       No temperature intolerance  Psychiatric/Behavioral: Negative for depression and suicidal ideas.    Blood pressure (!) 169/73, pulse 80, temperature 98.7 F (37.1 C), temperature source Oral, resp. rate 15, height 5' 4" (1.626 m), weight 87.1 kg (192 lb), SpO2 100 %. Physical Exam  Vitals reviewed. Constitutional: She is oriented to person, place, and time. She appears well-developed and well-nourished. No distress.  HENT:  Head: Normocephalic and atraumatic.  Mouth/Throat: Oropharynx is clear and moist.  Eyes: Conjunctivae and EOM are normal. Pupils are equal, round, and reactive to light. No scleral icterus.  Neck: Normal range of motion. Neck supple. No tracheal deviation present. No thyromegaly present.  Cardiovascular: Normal rate, regular rhythm and normal heart sounds.  Exam reveals no gallop and no friction rub.   No murmur heard. Respiratory: Effort normal and breath sounds normal.  GI: Soft. Bowel sounds are normal. She exhibits no distension. There is no tenderness.  Genitourinary:  Genitourinary Comments: Deferred  Musculoskeletal: Normal range of motion. She exhibits edema (Trace).  Lymphadenopathy:    She has no cervical adenopathy.  Neurological: She is alert and oriented to person, place, and time. No cranial nerve deficit. She exhibits normal muscle tone.  Skin: Skin is warm and dry. No rash noted. No erythema.  Psychiatric: She has a normal mood and affect. Her behavior is normal. Judgment and thought content normal.     Assessment/Plan This is a 75 year old female admitted for elevated troponin. 1. Elevated troponin: Patient denies chest pain.  Underlying chronic kidney disease makes clearance of cardiac enzymes slower. Nonetheless, follow cardiac biomarkers. Continue to monitor telemetry. Cardiology consultation at the discretion of the primary team. 2. UTI: Contributed to weakness and nausea. Ceftriaxone daily. 3. Diabetes mellitus type 2: Continue linagliptin; sliding scale insulin while hospitalized. 4. Hypertension: Continue hydralazine as well as carvedilol. I have made her Isordil twice a day instead of once a day as was her home regimen. Elevated troponin may also be secondary to hypertensive urgency and/or repeated vomiting. 5. Nausea and vomiting: Improving with IV antiemetics. Hydrate with intravenous fluid. 6. Anemia: Continue iron supplementation 7. History of breast cancer: Continue Arimidex 8. Gout: Controlled; continue allopurinol 9. Peripheral artery disease: Continue Trental 10. DVT prophylaxis: Heparin 11. GI prophylaxis: None The patient is a full code. Time spent on admission orders and patient care approximately 45 minutes   Harrie Foreman, MD 05/04/2016, 4:15 AM

## 2016-05-04 NOTE — Progress Notes (Signed)
Spoke with Dr Tressia Miners about patient's elevated  Troponins. Current troponins 0.47, trending down. Md acknowledged, no new orders given.

## 2016-05-04 NOTE — Care Management Obs Status (Signed)
[place] NOTIFICATION   Patient Details  Name: Alexis Tucker MRN: [id number] Date of Birth: 03/10/1941   Medicare Observation Status Notification Given:  Yes    Beau Fanny, RN 05/04/2016, 7:02 AM

## 2016-05-04 NOTE — Progress Notes (Addendum)
[place] [facility] [place] NAME: Alexis Tucker    MR#:  [id number]  DATE OF BIRTH:  Apr 25, 1941  SUBJECTIVE:  CHIEF COMPLAINT:  No chief complaint on file.  -Admitted with chest pain dizziness and nausea. Troponins are elevated. -Currently denies any chest pain. Still very nauseous and dizzy  REVIEW OF SYSTEMS:  Review of Systems  Constitutional: Negative for chills, fever and malaise/fatigue.  HENT: Negative for ear discharge, ear pain, nosebleeds and tinnitus.   Eyes: Negative for blurred vision and double vision.  Respiratory: Negative for cough, shortness of breath and wheezing.   Cardiovascular: Negative for chest pain, palpitations and leg swelling.  Gastrointestinal: Positive for nausea. Negative for abdominal pain, constipation, diarrhea and vomiting.  Genitourinary: Negative for dysuria and urgency.  Musculoskeletal: Negative for myalgias and neck pain.  Neurological: Positive for dizziness. Negative for speech change, focal weakness, seizures and headaches.  Psychiatric/Behavioral: Negative for depression.    DRUG ALLERGIES:   Allergies  Allergen Reactions  . Codeine   . Tramadol Other (See Comments)    nightmares  . Sulfa Antibiotics Itching and Rash    VITALS:  Blood pressure (!) 167/66, pulse 83, temperature 98 F (36.7 C), temperature source Oral, resp. rate 18, height 5\' 4"  (1.626 m), weight 87.1 kg (192 lb), SpO2 100 %.  PHYSICAL EXAMINATION:  Physical Exam  GENERAL:  75 y.o.-year-old patient lying in the bed with no acute distress.  EYES: Pupils equal, round, reactive to light and accommodation. No scleral icterus. Extraocular muscles intact.  HEENT: Head atraumatic, normocephalic. Oropharynx and nasopharynx clear.  NECK:  Supple, no jugular venous distention. No thyroid enlargement, no tenderness.  LUNGS: Normal breath sounds bilaterally, no wheezing, rales,rhonchi or crepitation. No use of accessory muscles of  respiration.  CARDIOVASCULAR: S1, S2 normal. No murmurs, rubs, or gallops.  ABDOMEN: Soft, nontender, nondistended. Bowel sounds present. No organomegaly or mass.  EXTREMITIES: No pedal edema, cyanosis, or clubbing.  NEUROLOGIC: Cranial nerves II through XII are intact. Muscle strength 5/5 in all extremities. Sensation intact. Gait not checked.  PSYCHIATRIC: The patient is alert and oriented x 3.  SKIN: No obvious rash, lesion, or ulcer.    LABORATORY PANEL:   CBC  Recent Labs Lab 05/04/16 0010  WBC 10.4  HGB 12.2  HCT 36.6  PLT 119*   ------------------------------------------------------------------------------------------------------------------  Chemistries   Recent Labs Lab 05/04/16 0010  NA 140  K 4.8  CL 108  CO2 20*  GLUCOSE 177*  BUN 43*  CREATININE 4.15*  CALCIUM 9.4  AST 39  ALT 15  ALKPHOS 86  BILITOT 0.8   ------------------------------------------------------------------------------------------------------------------  Cardiac Enzymes  Recent Labs Lab 05/04/16 1153  TROPONINI 0.47*   ------------------------------------------------------------------------------------------------------------------  RADIOLOGY:  Ct Abdomen Pelvis Wo Contrast  Result Date: 05/04/2016 CLINICAL DATA:  Acute onset of nausea and vomiting. Confusion and generalized weakness. Initial encounter. EXAM: CT ABDOMEN AND PELVIS WITHOUT CONTRAST TECHNIQUE: Multidetector CT imaging of the abdomen and pelvis was performed following the standard protocol without IV contrast. COMPARISON:  Lumbar spine radiographs performed 03/13/2014 FINDINGS: Lower chest: The visualized lung bases are grossly clear. Calcification is noted at the mitral and aortic valves. Mild coronary artery calcification is seen. Hepatobiliary: A small calcified granuloma is noted at the right hepatic dome. The patient is status post cholecystectomy, with clips noted along the gallbladder fossa. Pancreas: There is  dilatation of the pancreatic duct to 7 mm in maximal diameter, of uncertain significance. Distal obstruction cannot be excluded.  MRCP or ERCP could be considered for further evaluation, as deemed clinically appropriate. Spleen: The spleen is unremarkable in appearance. Adrenals/Urinary Tract: The adrenal glands are unremarkable in appearance. The kidneys are within normal limits. There is no evidence of hydronephrosis. No renal or ureteral stones are identified. Mild nonspecific perinephric stranding is noted bilaterally. Stomach/Bowel: Visualized small and large bowel loops are grossly unremarkable, aside from mild extension of multiple small and large bowel loops into a broad-based anterior abdominal wall bulge. Postoperative change is noted about the ascending colon. The stomach is unremarkable in appearance. Vascular/Lymphatic: Scattered calcification is seen along the abdominal aorta and its branches. The abdominal aorta is otherwise grossly unremarkable. The inferior vena cava is grossly unremarkable. No retroperitoneal lymphadenopathy is seen. No pelvic sidewall lymphadenopathy is identified. Reproductive: The bladder is mildly distended and grossly unremarkable. The patient is status post hysterectomy. No suspicious adnexal masses are seen. Other: No additional soft tissue abnormalities are seen. Musculoskeletal: No acute osseous abnormalities are identified. This endplate sclerotic change is noted at L5-S1, with underlying vacuum phenomenon. Mild underlying facet disease is noted. The visualized musculature is unremarkable in appearance. IMPRESSION: 1. No acute abnormality seen to explain the patient's symptoms. 2. Dilatation of the pancreatic duct to 7 mm in maximal diameter, of uncertain significance. Distal obstruction cannot be excluded. MRCP or ERCP could be considered for further evaluation, as deemed clinically appropriate. 3. Calcification at the mitral and aortic valves. Mild coronary artery  calcification noted. 4. Scattered aortic atherosclerosis. 5. Mild extension of multiple small and large bowel loops into a broad-based anterior abdominal wall bulge. Visualized bowel loops are otherwise unremarkable. Electronically Signed   By: Garald Balding M.D.   On: 05/04/2016 03:23   Dg Chest Port 1 View  Result Date: 05/04/2016 CLINICAL DATA:  Acute onset of generalized weakness. Nausea and vomiting. Found on floor. Confusion. Initial encounter. EXAM: PORTABLE CHEST 1 VIEW COMPARISON:  None. FINDINGS: The lungs are well-aerated. There is elevation of the right hemidiaphragm. There is no evidence of focal opacification, pleural effusion or pneumothorax. The cardiomediastinal silhouette is within normal limits. No acute osseous abnormalities are seen. IMPRESSION: Elevation of the right hemidiaphragm.  Lungs remain grossly clear. Electronically Signed   By: Garald Balding M.D.   On: 05/04/2016 00:23    EKG:   Orders placed or performed during the hospital encounter of 05/03/16  . ED EKG  . ED EKG  . EKG 12-Lead  . EKG 12-Lead    ASSESSMENT AND PLAN:   75 year old female with past medical history significant for CK D stage IV, diabetes, diabetic retinopathy, GERD, hypertension, anemia him a hypertension, neuropathy presents from home secondary to worsening nausea and dizziness. Also complained of chest pain on admission.  #1 nausea and dizziness-gastroenteritis versus vertigo symptoms  -gentle hydration. Symptomatically treatment for nausea and dizziness. Meclizine added -Rule out cardiac causes - no evidence of UTI- discontinued ABX  #2 Elevated troponin- NSTEMI vs demand ischemia and poor renal clearance - no chest pain now, but had in ED - plateaued troponins, ECHO and cards consult - asa, coreg, imdur, add statin - hold off on heparin drip until seen by cardiology  #3 DM- linagliptin, SSI  #4 history of breast cancer-on Arimidex  #5 CKD stage 5- baseline cr at 4, stable now,  avoid nephrotoxins Follows with Dr. Candiss Norse as outpatient. Monitor  #6 DVT Prophylaxis- heparin SQ     All the records are reviewed and case discussed with Care Management/Social Workerr. Management plans  discussed with the patient, family and they are in agreement.  CODE STATUS: Full code  TOTAL TIME TAKING CARE OF THIS PATIENT: 38 minutes.   POSSIBLE D/C IN 1-2 DAYS, DEPENDING ON CLINICAL CONDITION.   Gladstone Lighter M.D on 05/04/2016 at 2:53 PM  Between 7am to 6pm - Pager - [phone]  After 6pm go to [email or web address] - password [email or web address] [facility]  Office  [phone]  CC: Primary care physician; Ria Bush, MD

## 2016-05-04 NOTE — ED Notes (Signed)
Patient is alert and oriented at this time.  Reports feeling slightly nauseous and hungry.  Denies vomiting.  Patient given graham crackers and orange juice due to low blood sugar.  Patient is in no obvious distress at this time.  Denies chest pain or shortness of breath.

## 2016-05-04 NOTE — Consult Note (Signed)
Cardiology Consultation Note  Patient ID: Alexis Tucker, MRN: [id number], DOB/AGE: 73-Jun-1942 73 y.o. Admit date: 05/03/2016   Date of Consult: 05/04/2016 Primary Physician: Ria Bush, MD Primary Cardiologist: New to [facility] Requesting Physician: Dr. Tressia Miners, MD  Chief Complaint: Nausea and vomiting x 2 days Reason for Consult: Elevated troponin   HPI: 75 y.o. 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 Troponin was found to be mildly elevated at 0.62-->0.47 currently.   She does not have any previously diagnosed cardiac history and has never seen a cardiologist before. She does report a stress test years ago for routine exam. Over the past 2 days she has been experiencing persistent nausea and vomiting and continues to vomit at this time. She was feeling very weak and presented to the hospital for further evaluation. Never with chest pain, SOB, diaphoresis, dizziness, presyncope, or syncope. Of note, over the past couple of months she has been feeling more fatigued.   Upon the patient's arrival to [facility] they were found to have a troponin of 0.62-->0.60-->0.47, SCr 1.15 (her baseline in 4), K+ 4.8, wbc 10.4, hgb 12.2. ECG non-acute as below, CXR showed elevated right hemidiaphragm, CT abdomen showed no acute abnormality with 7 mm pancreatic duct dilatation. Currently with persisting nausea and vomiting without chest pain.    Past Medical History:  Diagnosis Date  . Anemia    per prior pcp  . Carotid stenosis, asymptomatic 01/2014   mild ICA, severe in ECA, rec rpt [place] 2 years  . Cataracts, bilateral   . CKD (chronic kidney disease) stage 4, GFR 15-29 ml/min (HCC) 01/2012   Cr 3.85, in 3s since 2010, discussing HD and L forearm  graft (Singh/Schnier)  . Diabetes type 2, controlled ([place]) 2000s  . Diabetic retinopathy with macular edema ([place]) 03/2014   [facility] eye on avastin  . DJD (degenerative joint disease)    per prior pcp  . GERD (gastroesophageal reflux disease)   . Glaucoma 03/2014  . History of breast cancer 2008   infiltrating ductal carcinoma s/p mammosite  . History of colon cancer 2006   s/p colectomy  . History of hepatitis A 1990s   from food, resolved  . HTN (hypertension)   . Macular degeneration    wet R with vision loss, dry L; to see retina specialist  . Neuropathy ([place])    per prior pcp  . OA (osteoarthritis)    knees and hips  . Osteonecrosis (HCC)    hips?  . Post-surgical hypothyroidism       Most Recent Cardiac Studies: none   Surgical History:  Past Surgical History:  Procedure Laterality Date  . ABDOMINAL HYSTERECTOMY  1976   after tubal pregnancy, h/o fibroids with heavy bleeding  . BREAST MAMMOSITE  2008  . carotid ultrasound  12/2012   1-39% bilateral stenosis, severe in ECA  . CHOLECYSTECTOMY  2005  . COLECTOMY  2005   colon cancer  . COLON SURGERY  2006   bowel obstruction  . COLONOSCOPY  2008   ext hem, ileo-colonic anastomosis in cecum, tubular adenoma x1, rec rpt 1 yr for multiple polyps (in [place])  . THYROIDECTOMY, PARTIAL     unclear why  . TOTAL KNEE  ARTHROPLASTY Right 1999  . US ECHOCARDIOGRAPHY  11/2007   nl LV fxn EF 51%, diastolic dysfunction, LVH, tr TR and MR, slcerotic aortic valve     Home Meds: Prior to Admission medications   Medication Sig Start Date End Date Taking? Authorizing Provider  acetaminophen (TYLENOL) 500 MG tablet Take 1,000 mg by mouth every 12 (twelve) hours as needed.   Yes Historical Provider, MD  allopurinol (ZYLOPRIM) 100 MG tablet TAKE 1 TABLET BY MOUTH DAILY 07/30/15  Yes Ria Bush, MD  anastrozole (ARIMIDEX) 1 MG tablet TAKE 1 TABLET BY MOUTH DAILY 01/11/16  Yes Ria Bush, MD  ARMOUR THYROID 60 MG  tablet TAKE 1 TABLET BY MOUTH DAILY -NEED PHYSICAL 07/30/15  Yes Ria Bush, MD  Blood Glucose Monitoring Suppl [facility] BLOOD GLUCOSE METER) W/DEVICE KIT Use as directed to check sugars daily 250.42 03/13/14  Yes Ria Bush, MD  Calcium-Magnesium-Vitamin D (CALCIUM MAGNESIUM PO) Take 1 tablet by mouth daily.   Yes Historical Provider, MD  carvedilol (COREG) 25 MG tablet TAKE 1 TABLET BY MOUTH DAILY 11/24/15  Yes Ria Bush, MD  Folic Acid-Vit W2-HEN I77 (FOLBEE) 2.5-25-1 MG TABS tablet Take 1 tablet by mouth daily.   Yes Historical Provider, MD  glimepiride (AMARYL) 1 MG tablet TAKE ONE TABLET BY MOUTH EACH MORNING BEFORE BREAKFAST -- NEED PHYSICAL 07/30/15  Yes Ria Bush, MD  hydrALAZINE (APRESOLINE) 50 MG tablet Take 1 tablet (50 mg total) by mouth daily. 03/14/14  Yes Ria Bush, MD  isosorbide dinitrate (ISORDIL) 20 MG tablet TAKE 1 TABLET BY MOUTH DAILY 07/30/15  Yes Ria Bush, MD  latanoprost (XALATAN) 0.005 % ophthalmic solution Place 1 drop into both eyes at bedtime.   Yes Historical Provider, MD  ONGLYZA 5 MG TABS tablet TAKE 1 TABLET BY MOUTH DAILY 07/30/15  Yes Ria Bush, MD  pentoxifylline (TRENTAL) 400 MG CR tablet TAKE 1 TABLET BY MOUTH DAILY 10/20/15  Yes Ria Bush, MD  Cholecalciferol (VITAMIN D-3) 5000 UNITS TABS Take 1 tablet by mouth daily.    Historical Provider, MD  FERROUS GLUCONATE PO Take 28 mg by mouth daily.    Historical Provider, MD    Inpatient Medications:  . allopurinol  100 mg Oral Daily  . anastrozole  1 mg Oral Daily  . carvedilol  25 mg Oral Daily  . cholecalciferol  5,000 Units Oral Daily  . docusate sodium  100 mg Oral BID  . ferrous gluconate  324 mg Oral Q breakfast  . heparin  5,000 Units Subcutaneous Q8H  . hydrALAZINE  50 mg Oral Daily  . insulin aspart  0-5 Units Subcutaneous QHS  . insulin aspart  0-9 Units Subcutaneous TID WC  . isosorbide dinitrate  20 mg Oral BID  . latanoprost  1 drop Both Eyes QHS    . linagliptin  5 mg Oral Daily  . [START ON 05/05/2016] multivitamin  1 tablet Oral Q breakfast  . pentoxifylline  400 mg Oral Daily  . thyroid  60 mg Oral QAC breakfast   . sodium chloride 75 mL/hr at 05/04/16 0800    Allergies:  Allergies  Allergen Reactions  . Codeine   . Tramadol Other (See Comments)    nightmares  . Sulfa Antibiotics Itching and Rash    Social History   Social History  . Marital status: Divorced    Spouse name: N/A  . Number of children: N/A  . Years of education: N/A   Occupational History  . Not on file.   Social History  Main Topics  . Smoking status: Former Research scientist (life sciences)  . Smokeless tobacco: Never Used  . Alcohol use No     Comment: rare  . Drug use: No  . Sexual activity: Not on file   Other Topics Concern  . Not on file   Social History Narrative   Lives alone, 1 dog.   Granddaughter in [place] (attorney).   Divorced   Occu: retired, worked in Engineer, technical sales   Edu: college     Family History  Problem Relation Age of Onset  . Diabetes Mother   . Hypertension Mother   . Arthritis Mother   . Alcohol abuse Father   . Arthritis Father   . Stroke Daughter   . CAD Neg Hx   . Cancer Neg Hx      Review of Systems: Review of Systems  Constitutional: Positive for malaise/fatigue. Negative for chills, diaphoresis, fever and weight loss.  HENT: Negative for congestion.   Eyes: Negative for discharge and redness.  Respiratory: Negative for cough, hemoptysis, sputum production, shortness of breath and wheezing.   Cardiovascular: Negative for chest pain, palpitations, orthopnea, claudication, leg swelling and PND.  Gastrointestinal: Positive for nausea and vomiting. Negative for abdominal pain, blood in stool, heartburn and melena.  Genitourinary: Negative for hematuria.  Musculoskeletal: Negative for falls and myalgias.  Skin: Negative for rash.  Neurological: Positive for weakness. Negative for dizziness, tingling, tremors, sensory change, speech change,  focal weakness and loss of consciousness.  Endo/Heme/Allergies: Does not bruise/bleed easily.  Psychiatric/Behavioral: Negative for substance abuse. The patient is not nervous/anxious.   All other systems reviewed and are negative.   Labs:  Recent Labs  05/04/16 0010 05/04/16 0522 05/04/16 1153  TROPONINI 0.62* 0.60* 0.47*   Lab Results  Component Value Date   WBC 10.4 05/04/2016   HGB 12.2 05/04/2016   HCT 36.6 05/04/2016   MCV 88.7 05/04/2016   PLT 119 (L) 05/04/2016     Recent Labs Lab 05/04/16 0010  NA 140  K 4.8  CL 108  CO2 20*  BUN 43*  CREATININE 4.15*  CALCIUM 9.4  PROT 7.8  BILITOT 0.8  ALKPHOS 86  ALT 15  AST 39  GLUCOSE 177*   Lab Results  Component Value Date   CHOL 172 07/27/2015   HDL 62.00 07/27/2015   LDLCALC 95 07/27/2015   TRIG 78.0 07/27/2015   No results found for: DDIMER  Radiology/Studies:  Ct Abdomen Pelvis Wo Contrast  Result Date: 05/04/2016 CLINICAL DATA:  Acute onset of nausea and vomiting. Confusion and generalized weakness. Initial encounter. EXAM: CT ABDOMEN AND PELVIS WITHOUT CONTRAST TECHNIQUE: Multidetector CT imaging of the abdomen and pelvis was performed following the standard protocol without IV contrast. COMPARISON:  Lumbar spine radiographs performed 03/13/2014 FINDINGS: Lower chest: The visualized lung bases are grossly clear. Calcification is noted at the mitral and aortic valves. Mild coronary artery calcification is seen. Hepatobiliary: A small calcified granuloma is noted at the right hepatic dome. The patient is status post cholecystectomy, with clips noted along the gallbladder fossa. Pancreas: There is dilatation of the pancreatic duct to 7 mm in maximal diameter, of uncertain significance. Distal obstruction cannot be excluded. MRCP or ERCP could be considered for further evaluation, as deemed clinically appropriate. Spleen: The spleen is unremarkable in appearance. Adrenals/Urinary Tract: The adrenal glands are  unremarkable in appearance. The kidneys are within normal limits. There is no evidence of hydronephrosis. No renal or ureteral stones are identified. Mild nonspecific perinephric stranding is noted bilaterally. Stomach/Bowel:  Visualized small and large bowel loops are grossly unremarkable, aside from mild extension of multiple small and large bowel loops into a broad-based anterior abdominal wall bulge. Postoperative change is noted about the ascending colon. The stomach is unremarkable in appearance. Vascular/Lymphatic: Scattered calcification is seen along the abdominal aorta and its branches. The abdominal aorta is otherwise grossly unremarkable. The inferior vena cava is grossly unremarkable. No retroperitoneal lymphadenopathy is seen. No pelvic sidewall lymphadenopathy is identified. Reproductive: The bladder is mildly distended and grossly unremarkable. The patient is status post hysterectomy. No suspicious adnexal masses are seen. Other: No additional soft tissue abnormalities are seen. Musculoskeletal: No acute osseous abnormalities are identified. This endplate sclerotic change is noted at L5-S1, with underlying vacuum phenomenon. Mild underlying facet disease is noted. The visualized musculature is unremarkable in appearance. IMPRESSION: 1. No acute abnormality seen to explain the patient's symptoms. 2. Dilatation of the pancreatic duct to 7 mm in maximal diameter, of uncertain significance. Distal obstruction cannot be excluded. MRCP or ERCP could be considered for further evaluation, as deemed clinically appropriate. 3. Calcification at the mitral and aortic valves. Mild coronary artery calcification noted. 4. Scattered aortic atherosclerosis. 5. Mild extension of multiple small and large bowel loops into a broad-based anterior abdominal wall bulge. Visualized bowel loops are otherwise unremarkable. Electronically Signed   By: Garald Balding M.D.   On: 05/04/2016 03:23   Dg Chest Port 1 View  Result  Date: 05/04/2016 CLINICAL DATA:  Acute onset of generalized weakness. Nausea and vomiting. Found on floor. Confusion. Initial encounter. EXAM: PORTABLE CHEST 1 VIEW COMPARISON:  None. FINDINGS: The lungs are well-aerated. There is elevation of the right hemidiaphragm. There is no evidence of focal opacification, pleural effusion or pneumothorax. The cardiomediastinal silhouette is within normal limits. No acute osseous abnormalities are seen. IMPRESSION: Elevation of the right hemidiaphragm.  Lungs remain grossly clear. Electronically Signed   By: Garald Balding M.D.   On: 05/04/2016 00:23    EKG: Interpreted by me showed: NSR, 84 bpm, poor R wave progression, no acute st/t changes Telemetry: Interpreted by me showed: NSR, 70's bpm  Weights: Filed Weights   05/03/16 2333  Weight: 192 lb (87.1 kg)     Physical Exam: Blood pressure (!) 153/44, pulse 73, temperature 98.9 F (37.2 C), temperature source Oral, resp. rate 17, height _0  (1.626 m), weight 192 lb (87.1 kg), SpO2 99 %. Body mass index is 32.96 kg/m. General: Well developed, well nourished, in no acute distress. Head: Normocephalic, atraumatic, sclera non-icteric, no xanthomas, nares are without discharge.  Neck: Negative for carotid bruits. JVD not elevated. Lungs: Clear bilaterally to auscultation without wheezes, rales, or rhonchi. Breathing is unlabored. Heart: RRR with S1 S2. No murmurs, rubs, or gallops appreciated. Abdomen: Soft, non-tender, non-distended with normoactive bowel sounds. No hepatomegaly. No rebound/guarding. No obvious abdominal masses. Msk:  Strength and tone appear normal for age. Extremities: No clubbing or cyanosis. No edema. Distal pedal pulses are 2+ and equal bilaterally. Neuro: Alert and oriented X 3. No facial asymmetry. No focal deficit. Moves all extremities spontaneously. Psych:  Responds to questions appropriately with a normal affect.    Assessment and Plan:  Principal Problem:   Nausea and  vomiting Active Problems:   Chronic kidney disease (CKD), stage V (HCC)   Elevated troponin   Accelerated hypertension   Type 2 diabetes mellitus, uncontrolled, with renal complications (HCC)    1. Elevated troponin: -Of uncertain significance at this time given her lack of chest pain  in the setting of persistent nausea, accelerated hypertension, and vomiting coupled with CKD stage V not on dialysis with a baseline SCr of 4 -Troponin is trending down at this time -Agree with echo to evaluate LV systolic function and wall motion -Would hold on heparin gtt at this time unless echo is abnormal -Would not pursue cardiac cath at this time in this patient unless echo drastically indicates possible ischemia given this would place her on dialysis and she does not want that -Start ASA -Continue BB -Check lipid and A1C for further risk stratification   2. Nausea and vomiting: -Persists -Uncertain at this time if this is a primary cardiac event vs GI -Continue supportive care per IM  3. CKD stage V: -Not on dialysis yet -Stable at this time -Avoid nephrotoxins, including IV contrast  4. Accelerated HTN: -Peak BP of 578 systolic -BP remains elevated at this time at 153/44 -Change hydralazine to tid dosing from daily -Continue Coreg and Imdur  5. History of breast and colon cancer: -Per IM   Signed, Christell Faith, PA-C [facility] Pager: [phone] 05/04/2016, 4:07 PM

## 2016-05-04 NOTE — ED Notes (Signed)
Patient reports nausea has improved.

## 2016-05-04 NOTE — ED Notes (Signed)
Patient eating breakfast at this time.

## 2016-05-04 NOTE — ED Notes (Signed)
When doing orthostatic vitals patient stated she felt dizzy when standing

## 2016-05-05 DIAGNOSIS — R748 Abnormal levels of other serum enzymes: Secondary | ICD-10-CM | POA: Diagnosis not present

## 2016-05-05 DIAGNOSIS — E1122 Type 2 diabetes mellitus with diabetic chronic kidney disease: Secondary | ICD-10-CM | POA: Diagnosis not present

## 2016-05-05 DIAGNOSIS — N185 Chronic kidney disease, stage 5: Secondary | ICD-10-CM | POA: Diagnosis not present

## 2016-05-05 DIAGNOSIS — R112 Nausea with vomiting, unspecified: Secondary | ICD-10-CM | POA: Diagnosis not present

## 2016-05-05 DIAGNOSIS — K529 Noninfective gastroenteritis and colitis, unspecified: Secondary | ICD-10-CM | POA: Diagnosis not present

## 2016-05-05 DIAGNOSIS — E872 Acidosis: Secondary | ICD-10-CM | POA: Diagnosis not present

## 2016-05-05 DIAGNOSIS — I120 Hypertensive chronic kidney disease with stage 5 chronic kidney disease or end stage renal disease: Secondary | ICD-10-CM | POA: Diagnosis not present

## 2016-05-05 DIAGNOSIS — R809 Proteinuria, unspecified: Secondary | ICD-10-CM | POA: Diagnosis not present

## 2016-05-05 DIAGNOSIS — Z85038 Personal history of other malignant neoplasm of large intestine: Secondary | ICD-10-CM | POA: Diagnosis not present

## 2016-05-05 DIAGNOSIS — R932 Abnormal findings on diagnostic imaging of liver and biliary tract: Secondary | ICD-10-CM | POA: Diagnosis not present

## 2016-05-05 LAB — LIPID PANEL
Cholesterol: 152 mg/dL (ref 0–200)
HDL: 39 mg/dL — ABNORMAL LOW (ref >40)
LDL Cholesterol: 94 mg/dL (ref 0–99)
Total CHOL/HDL Ratio: 3.9 RATIO
Triglycerides: 94 mg/dL (ref <150)
VLDL: 19 mg/dL (ref 0–40)

## 2016-05-05 LAB — BASIC METABOLIC PANEL
Anion gap: 4 — ABNORMAL LOW (ref 5–15)
BUN: 38 mg/dL — AB (ref 6–20)
CHLORIDE: 113 mmol/L — AB (ref 101–111)
CO2: 22 mmol/L (ref 22–32)
CREATININE: 3.62 mg/dL — AB (ref 0.44–1.00)
Calcium: 8.1 mg/dL — ABNORMAL LOW (ref 8.9–10.3)
GFR calc Af Amer: 13 mL/min — ABNORMAL LOW (ref >60)
GFR, EST NON AFRICAN AMERICAN: 12 mL/min — AB (ref >60)
GLUCOSE: 122 mg/dL — AB (ref 65–99)
POTASSIUM: 4.1 mmol/L (ref 3.5–5.1)
Sodium: 139 mmol/L (ref 135–145)

## 2016-05-05 LAB — HEMOGLOBIN A1C
Hgb A1c MFr Bld: 5.9 % — ABNORMAL HIGH (ref 4.8–5.6)
Mean Plasma Glucose: 123 mg/dL

## 2016-05-05 LAB — GLUCOSE, CAPILLARY
Glucose-Capillary: 104 mg/dL — ABNORMAL HIGH (ref 65–99)
Glucose-Capillary: 113 mg/dL — ABNORMAL HIGH (ref 65–99)
Glucose-Capillary: 162 mg/dL — ABNORMAL HIGH (ref 65–99)
Glucose-Capillary: 100 mg/dL — ABNORMAL HIGH (ref 65–99)

## 2016-05-05 LAB — LIPASE, BLOOD: Lipase: 40 U/L (ref 11–51)

## 2016-05-05 MED ORDER — SENNA 8.6 MG PO TABS
2.0000 | ORAL_TABLET | Freq: Every day | ORAL | Status: DC
  Administered 2016-05-05 – 2016-05-08 (×4): 17.2 mg via ORAL
  Filled 2016-05-05 (×4): qty 2

## 2016-05-05 MED ORDER — PROMETHAZINE HCL 25 MG PO TABS
25.0000 mg | ORAL_TABLET | Freq: Four times a day (QID) | ORAL | Status: DC | PRN
  Administered 2016-05-05 – 2016-05-07 (×3): 25 mg via ORAL
  Filled 2016-05-05 (×3): qty 1

## 2016-05-05 MED ORDER — ONDANSETRON HCL 4 MG/2ML IJ SOLN
4.0000 mg | Freq: Four times a day (QID) | INTRAMUSCULAR | Status: DC
  Administered 2016-05-05 – 2016-05-07 (×7): 4 mg via INTRAVENOUS
  Filled 2016-05-05 (×6): qty 2

## 2016-05-05 NOTE — Progress Notes (Signed)
Patient: Alexis Tucker / Admit Date: 05/03/2016 / Date of Encounter: 05/05/2016, 8:43 AM   Subjective: Still with nausea and vomiting this morning. No chest pain or SOB. Echo showed normal LV systolic function with EF of 60-65%, normal wall motion, GR1DD, calcified mitral annulus, mildly dilated left atrium, RV systolic function normal, PASP 42 mmHg. Renal function 3.62 this morning (baseline 4).   Review of Systems: Review of Systems  Constitutional: Positive for malaise/fatigue. Negative for chills, diaphoresis, fever and weight loss.  HENT: Negative for congestion.   Eyes: Negative for discharge and redness.  Respiratory: Negative for cough, hemoptysis, sputum production, shortness of breath and wheezing.   Cardiovascular: Negative for chest pain, palpitations, orthopnea, claudication, leg swelling and PND.  Gastrointestinal: Positive for nausea and vomiting. Negative for abdominal pain, blood in stool, heartburn and melena.  Genitourinary: Negative for hematuria.  Musculoskeletal: Negative for falls and myalgias.  Skin: Negative for rash.  Neurological: Positive for weakness. Negative for dizziness, tingling, tremors, sensory change, speech change, focal weakness and loss of consciousness.  Endo/Heme/Allergies: Does not bruise/bleed easily.  Psychiatric/Behavioral: Negative for substance abuse. The patient is not nervous/anxious.   All other systems reviewed and are negative.   Objective: Telemetry: NSR, 70;s bpm, occasional PACs Physical Exam: Blood pressure (!) 133/58, pulse 77, temperature 98.5 F (36.9 C), temperature source Oral, resp. rate 16, height 5\' 4"  (1.626 m), weight 192 lb 11.2 oz (87.4 kg), SpO2 100 %. Body mass index is 33.08 kg/m. General: Well developed, well nourished, in no acute distress. Head: Normocephalic, atraumatic, sclera non-icteric, no xanthomas, nares are without discharge. Neck: Negative for carotid bruits. JVP not elevated. Lungs: Clear  bilaterally to auscultation without wheezes, rales, or rhonchi. Breathing is unlabored. Heart: RRR S1 S2 II/VI systolic murmur, no rubs, or gallops.  Abdomen: Soft, non-tender, non-distended with normoactive bowel sounds. No rebound/guarding. Extremities: No clubbing or cyanosis. No edema. Distal pedal pulses are 2+ and equal bilaterally. Neuro: Alert and oriented X 3. Moves all extremities spontaneously. Psych:  Responds to questions appropriately with a normal affect.   Intake/Output Summary (Last 24 hours) at 05/05/16 0843 Last data filed at 05/05/16 0529  Gross per 24 hour  Intake             2050 ml  Output              600 ml  Net             1450 ml    Inpatient Medications:  . allopurinol  100 mg Oral Daily  . anastrozole  1 mg Oral Daily  . aspirin EC  81 mg Oral Daily  . carvedilol  25 mg Oral Daily  . cholecalciferol  5,000 Units Oral Daily  . docusate sodium  100 mg Oral BID  . ferrous gluconate  324 mg Oral Q breakfast  . heparin  5,000 Units Subcutaneous Q8H  . hydrALAZINE  50 mg Oral Q8H  . insulin aspart  0-5 Units Subcutaneous QHS  . insulin aspart  0-9 Units Subcutaneous TID WC  . isosorbide dinitrate  20 mg Oral BID  . latanoprost  1 drop Both Eyes QHS  . linagliptin  5 mg Oral Daily  . multivitamin  1 tablet Oral Q breakfast  . pentoxifylline  400 mg Oral Daily  . thyroid  60 mg Oral QAC breakfast   Infusions:  . sodium chloride 75 mL/hr at 05/05/16 0529    Labs:  Recent Labs  05/04/16 0010 05/05/16  0359  NA 140 139  K 4.8 4.1  CL 108 113*  CO2 20* 22  GLUCOSE 177* 122*  BUN 43* 38*  CREATININE 4.15* 3.62*  CALCIUM 9.4 8.1*    Recent Labs  05/04/16 0010  AST 39  ALT 15  ALKPHOS 86  BILITOT 0.8  PROT 7.8  ALBUMIN 4.0    Recent Labs  05/04/16 0010  WBC 10.4  NEUTROABS 8.0*  HGB 12.2  HCT 36.6  MCV 88.7  PLT 119*    Recent Labs  05/04/16 0010 05/04/16 0522 05/04/16 1153 05/04/16 1648  TROPONINI 0.62* 0.60* 0.47* 0.37*    Invalid input(s): POCBNP  Recent Labs  05/04/16 0623  HGBA1C 5.9*     Weights: Filed Weights   05/03/16 2333 05/05/16 0500  Weight: 192 lb (87.1 kg) 192 lb 11.2 oz (87.4 kg)     Radiology/Studies:  Ct Abdomen Pelvis Wo Contrast  Result Date: 05/04/2016 CLINICAL DATA:  Acute onset of nausea and vomiting. Confusion and generalized weakness. Initial encounter. EXAM: CT ABDOMEN AND PELVIS WITHOUT CONTRAST TECHNIQUE: Multidetector CT imaging of the abdomen and pelvis was performed following the standard protocol without IV contrast. COMPARISON:  Lumbar spine radiographs performed 03/13/2014 FINDINGS: Lower chest: The visualized lung bases are grossly clear. Calcification is noted at the mitral and aortic valves. Mild coronary artery calcification is seen. Hepatobiliary: A small calcified granuloma is noted at the right hepatic dome. The patient is status post cholecystectomy, with clips noted along the gallbladder fossa. Pancreas: There is dilatation of the pancreatic duct to 7 mm in maximal diameter, of uncertain significance. Distal obstruction cannot be excluded. MRCP or ERCP could be considered for further evaluation, as deemed clinically appropriate. Spleen: The spleen is unremarkable in appearance. Adrenals/Urinary Tract: The adrenal glands are unremarkable in appearance. The kidneys are within normal limits. There is no evidence of hydronephrosis. No renal or ureteral stones are identified. Mild nonspecific perinephric stranding is noted bilaterally. Stomach/Bowel: Visualized small and large bowel loops are grossly unremarkable, aside from mild extension of multiple small and large bowel loops into a broad-based anterior abdominal wall bulge. Postoperative change is noted about the ascending colon. The stomach is unremarkable in appearance. Vascular/Lymphatic: Scattered calcification is seen along the abdominal aorta and its branches. The abdominal aorta is otherwise grossly unremarkable. The  inferior vena cava is grossly unremarkable. No retroperitoneal lymphadenopathy is seen. No pelvic sidewall lymphadenopathy is identified. Reproductive: The bladder is mildly distended and grossly unremarkable. The patient is status post hysterectomy. No suspicious adnexal masses are seen. Other: No additional soft tissue abnormalities are seen. Musculoskeletal: No acute osseous abnormalities are identified. This endplate sclerotic change is noted at L5-S1, with underlying vacuum phenomenon. Mild underlying facet disease is noted. The visualized musculature is unremarkable in appearance. IMPRESSION: 1. No acute abnormality seen to explain the patient's symptoms. 2. Dilatation of the pancreatic duct to 7 mm in maximal diameter, of uncertain significance. Distal obstruction cannot be excluded. MRCP or ERCP could be considered for further evaluation, as deemed clinically appropriate. 3. Calcification at the mitral and aortic valves. Mild coronary artery calcification noted. 4. Scattered aortic atherosclerosis. 5. Mild extension of multiple small and large bowel loops into a broad-based anterior abdominal wall bulge. Visualized bowel loops are otherwise unremarkable. Electronically Signed   By: Garald Balding M.D.   On: 05/04/2016 03:23   Dg Chest Port 1 View  Result Date: 05/04/2016 CLINICAL DATA:  Acute onset of generalized weakness. Nausea and vomiting. Found on floor. Confusion. Initial  encounter. EXAM: PORTABLE CHEST 1 VIEW COMPARISON:  None. FINDINGS: The lungs are well-aerated. There is elevation of the right hemidiaphragm. There is no evidence of focal opacification, pleural effusion or pneumothorax. The cardiomediastinal silhouette is within normal limits. No acute osseous abnormalities are seen. IMPRESSION: Elevation of the right hemidiaphragm.  Lungs remain grossly clear. Electronically Signed   By: Garald Balding M.D.   On: 05/04/2016 00:23     Assessment and Plan  Principal Problem:   Nausea and  vomiting Active Problems:   Chronic kidney disease (CKD), stage V (HCC)   Elevated troponin   Accelerated hypertension   Type 2 diabetes mellitus, uncontrolled, with renal complications ([place])    1. Elevated troponin: -Of uncertain significance at this time given her lack of chest pain in the setting of persistent nausea with vomiting, accelerated hypertension, and CKD stage V not on dialysis with a baseline SCr of 4 -Troponin is trending down at this time -Echo showed normal LV systolic function with normal wall motion -Hold heparin gtt given lack of chest pain and normal echo -Would not pursue cardiac cath at this time in this patient at this time given this would likely lead to dialysis and she does not want that -Could consider Lexiscan Myoview as an outpatient, though if abnormal that may lead to cardiac cath, thus placing her on dialysis which she does not want -Continue ASA -Continue BB -LDL 94, A1C 5.9%  2. Nausea and vomiting: -Persists -Uncertain at this time if this is a primary cardiac event vs GI -Continue supportive care per IM  3. CKD stage V: -Not on dialysis yet -Stable at this time -Avoid nephrotoxins, including IV contrast  4. Accelerated HTN: -Peak BP of 361 systolic -BP improved at this time -Continue hydralazine tid dosing from daily -Continue Coreg and Imdur  5. History of breast and colon cancer: -Per IM  6. Mild pulmonary HTN/chronic diastolic CHF: -Avoid Lasix at this time given renal function -Will likely need dialysis to mange volume  -Imdur/hydralazine   Signed, Christell Faith, PA-C [facility] Pager: [phone] 05/05/2016, 8:43 AM

## 2016-05-05 NOTE — Progress Notes (Signed)
[place] at [place] NAME: Alexis Tucker    MR#:  [id number]  DATE OF BIRTH:  1940/10/12  SUBJECTIVE:  CHIEF COMPLAINT:  No chief complaint on file.  -Patient still complains of significant nausea and dizziness. No chest pain, abdominal pain or vomiting. -Continues to feel weak  REVIEW OF SYSTEMS:  Review of Systems  Constitutional: Negative for chills, fever and malaise/fatigue.  HENT: Negative for ear discharge, ear pain, nosebleeds and tinnitus.   Eyes: Negative for blurred vision and double vision.  Respiratory: Negative for cough, shortness of breath and wheezing.   Cardiovascular: Negative for chest pain, palpitations and leg swelling.  Gastrointestinal: Positive for nausea. Negative for abdominal pain, constipation, diarrhea and vomiting.  Genitourinary: Negative for dysuria and urgency.  Musculoskeletal: Negative for myalgias and neck pain.  Neurological: Positive for dizziness. Negative for speech change, focal weakness, seizures and headaches.  Psychiatric/Behavioral: Negative for depression.    DRUG ALLERGIES:   Allergies  Allergen Reactions  . Codeine   . Tramadol Other (See Comments)    nightmares  . Sulfa Antibiotics Itching and Rash    VITALS:  Blood pressure (!) 144/66, pulse 87, temperature 98.4 F (36.9 C), temperature source Oral, resp. rate 16, height 5\' 4"  (1.626 m), weight 87.4 kg (192 lb 11.2 oz), SpO2 98 %.  PHYSICAL EXAMINATION:  Physical Exam  GENERAL:  75 y.o.-year-old patient lying in the bed with no acute distress.  EYES: Pupils equal, round, reactive to light and accommodation. No scleral icterus. Extraocular muscles intact.  HEENT: Head atraumatic, normocephalic. Oropharynx and nasopharynx clear.  NECK:  Supple, no jugular venous distention. No thyroid enlargement, no tenderness.  LUNGS: Normal breath sounds bilaterally, no wheezing, rales,rhonchi or crepitation. No use of accessory muscles of  respiration.  CARDIOVASCULAR: S1, S2 normal. No murmurs, rubs, or gallops.  ABDOMEN: Soft, nontender, nondistended. Bowel sounds present. No organomegaly or mass.  EXTREMITIES: No pedal edema, cyanosis, or clubbing.  NEUROLOGIC: Cranial nerves II through XII are intact. Muscle strength 5/5 in all extremities. Sensation intact. Gait not checked. No nystagmus. Global weakness noted. PSYCHIATRIC: The patient is alert and oriented x 3.  SKIN: No obvious rash, lesion, or ulcer.    LABORATORY PANEL:   CBC  Recent Labs Lab 05/04/16 0010  WBC 10.4  HGB 12.2  HCT 36.6  PLT 119*   ------------------------------------------------------------------------------------------------------------------  Chemistries   Recent Labs Lab 05/04/16 0010 05/05/16 0359  NA 140 139  K 4.8 4.1  CL 108 113*  CO2 20* 22  GLUCOSE 177* 122*  BUN 43* 38*  CREATININE 4.15* 3.62*  CALCIUM 9.4 8.1*  AST 39  --   ALT 15  --   ALKPHOS 86  --   BILITOT 0.8  --    ------------------------------------------------------------------------------------------------------------------  Cardiac Enzymes  Recent Labs Lab 05/04/16 1648  TROPONINI 0.37*   ------------------------------------------------------------------------------------------------------------------  RADIOLOGY:  Ct Abdomen Pelvis Wo Contrast  Result Date: 05/04/2016 CLINICAL DATA:  Acute onset of nausea and vomiting. Confusion and generalized weakness. Initial encounter. EXAM: CT ABDOMEN AND PELVIS WITHOUT CONTRAST TECHNIQUE: Multidetector CT imaging of the abdomen and pelvis was performed following the standard protocol without IV contrast. COMPARISON:  Lumbar spine radiographs performed 03/13/2014 FINDINGS: Lower chest: The visualized lung bases are grossly clear. Calcification is noted at the mitral and aortic valves. Mild coronary artery calcification is seen. Hepatobiliary: A small calcified granuloma is noted at the right hepatic dome. The  patient is status post cholecystectomy, with clips  noted along the gallbladder fossa. Pancreas: There is dilatation of the pancreatic duct to 7 mm in maximal diameter, of uncertain significance. Distal obstruction cannot be excluded. MRCP or ERCP could be considered for further evaluation, as deemed clinically appropriate. Spleen: The spleen is unremarkable in appearance. Adrenals/Urinary Tract: The adrenal glands are unremarkable in appearance. The kidneys are within normal limits. There is no evidence of hydronephrosis. No renal or ureteral stones are identified. Mild nonspecific perinephric stranding is noted bilaterally. Stomach/Bowel: Visualized small and large bowel loops are grossly unremarkable, aside from mild extension of multiple small and large bowel loops into a broad-based anterior abdominal wall bulge. Postoperative change is noted about the ascending colon. The stomach is unremarkable in appearance. Vascular/Lymphatic: Scattered calcification is seen along the abdominal aorta and its branches. The abdominal aorta is otherwise grossly unremarkable. The inferior vena cava is grossly unremarkable. No retroperitoneal lymphadenopathy is seen. No pelvic sidewall lymphadenopathy is identified. Reproductive: The bladder is mildly distended and grossly unremarkable. The patient is status post hysterectomy. No suspicious adnexal masses are seen. Other: No additional soft tissue abnormalities are seen. Musculoskeletal: No acute osseous abnormalities are identified. This endplate sclerotic change is noted at L5-S1, with underlying vacuum phenomenon. Mild underlying facet disease is noted. The visualized musculature is unremarkable in appearance. IMPRESSION: 1. No acute abnormality seen to explain the patient's symptoms. 2. Dilatation of the pancreatic duct to 7 mm in maximal diameter, of uncertain significance. Distal obstruction cannot be excluded. MRCP or ERCP could be considered for further evaluation, as  deemed clinically appropriate. 3. Calcification at the mitral and aortic valves. Mild coronary artery calcification noted. 4. Scattered aortic atherosclerosis. 5. Mild extension of multiple small and large bowel loops into a broad-based anterior abdominal wall bulge. Visualized bowel loops are otherwise unremarkable. Electronically Signed   By: Garald Balding M.D.   On: 05/04/2016 03:23   Dg Chest Port 1 View  Result Date: 05/04/2016 CLINICAL DATA:  Acute onset of generalized weakness. Nausea and vomiting. Found on floor. Confusion. Initial encounter. EXAM: PORTABLE CHEST 1 VIEW COMPARISON:  None. FINDINGS: The lungs are well-aerated. There is elevation of the right hemidiaphragm. There is no evidence of focal opacification, pleural effusion or pneumothorax. The cardiomediastinal silhouette is within normal limits. No acute osseous abnormalities are seen. IMPRESSION: Elevation of the right hemidiaphragm.  Lungs remain grossly clear. Electronically Signed   By: Garald Balding M.D.   On: 05/04/2016 00:23    EKG:   Orders placed or performed during the hospital encounter of 05/03/16  . ED EKG  . ED EKG  . EKG 12-Lead  . EKG 12-Lead    ASSESSMENT AND PLAN:   75 year old female with past medical history significant for CK D stage IV, diabetes, diabetic retinopathy, GERD, hypertension, anemia him a hypertension, neuropathy presents from home secondary to worsening nausea and dizziness. Also complained of chest pain on admission.  #1 nausea and dizziness-gastroenteritis or uremia  -gentle hydration. Symptomatically treatment for nausea and dizziness. Meclizine added -Changed diet to clear liquid diet. If nephrology does not think this is from uremia, we'll consult GI -Check orthostatic vitals today-adjusted nausea medication -Rule out cardiac causes - no evidence of UTI- discontinued ABX  #2 Elevated troponin- likely demand ischemia and poor renal clearance - no chest pain now,  - plateaued  troponins, ECHO with normal EF, no wall motion abnormalities and mild diastolic dysfunction noted and appreciate cards consult - asa, coreg, imdur, added statin - hold off on heparin drip. Outpatient  stress test recommended  #3 DM- linagliptin, SSI -Sugars have been borderline low due to her poor intake  #4 history of breast cancer-on Arimidex  #5 CKD stage 5- Creatinine is close to baseline and GFR remains less than 15. -Follows with Dr. Candiss Norse as an outpatient. Due to persistent nausea and CK D stage V, not sure if this is uremic symptoms. We'll consult nephrology today  #6 DVT Prophylaxis- heparin SQ  Physical Therapy consult.   All the records are reviewed and case discussed with Care Management/Social Workerr. Management plans discussed with the patient, family and they are in agreement.  CODE STATUS: Full code  TOTAL TIME TAKING CARE OF THIS PATIENT: 38 minutes.   POSSIBLE D/C IN 1-2 DAYS, DEPENDING ON CLINICAL CONDITION.   Gladstone Lighter M.D on 05/05/2016 at 10:59 AM  Between 7am to 6pm - Pager - [phone]  After 6pm go to [email or web address] - password [email or web address] [facility]  Office  [phone]  CC: Primary care physician; Ria Bush, MD

## 2016-05-05 NOTE — Evaluation (Signed)
Physical Therapy Evaluation Patient Details Name: Alexis Tucker MRN: [id number] DOB: 01-14-1941 Today's Date: 05/05/2016   History of Present Illness  presented to ER secondary to 2 day history of nausea/vomiting, progressive weakness and fall; admitted under observation for gastroenteritis, UTI.  Noted with elevated troponin, likely demand ischemia per cardiology.  Clinical Impression  Upon evaluation, patient fatigued and generally ill-appearing, but willing to participate with evaluation as able.  Generally weak and deconditioned due to acute illness.  Currently requiring min assist for bed mobility; sup for unsupported sitting balance.  Unable to attempt sit/stand or OOB activities due to nausea, general malaise. Vitals stable and WFL throughout session; no orthostasis noted with transition to upright. Would benefit from skilled PT to address above deficits and promote optimal return to PLOF; recommend transition to STR upon discharge from acute hospitalization. Will update recommendations as appropriate pending additional mobility assessment as patient medically able to tolerate.     Follow Up Recommendations SNF (pending additional mobility assessment)    Equipment Recommendations       Recommendations for Other Services       Precautions / Restrictions Precautions Precautions: Fall Restrictions Weight Bearing Restrictions: No      Mobility  Bed Mobility Overal bed mobility: Needs Assistance Bed Mobility: Supine to Sit;Sit to Supine     Supine to sit: Min assist Sit to supine: Min assist      Transfers                 General transfer comment: unable to tolerate secondary to nausea, general malaise  Ambulation/Gait             General Gait Details: unable to tolerate secondary to nausea, general malaise  Stairs            Wheelchair Mobility    Modified Rankin (Stroke Patients Only)       Balance Overall balance assessment: Needs  assistance Sitting-balance support: No upper extremity supported;Feet supported Sitting balance-Leahy Scale: Fair         Standing balance comment: unable to tolerate secondary to nausea, general malaise                             Pertinent Vitals/Pain Pain Assessment: No/denies pain    Home Living Family/patient expects to be discharged to:: Private residence Living Arrangements: Alone Available Help at Discharge: Family;Available PRN/intermittently Type of Home: House Home Access: Level entry     Home Layout: One level        Prior Function Level of Independence: Independent with assistive device(s)         Comments: Mod indep with ADLs, household mobility using SPC; reports approx 1 fall in previous six months     Hand Dominance        Extremity/Trunk Assessment   Upper Extremity Assessment: Generalized weakness (grossly 4-/5 throughout)           Lower Extremity Assessment: Generalized weakness (grossly 4-/5 throughout)         Communication   Communication: No difficulties  Cognition Arousal/Alertness: Lethargic Behavior During Therapy: WFL for tasks assessed/performed Overall Cognitive Status: Within Functional Limits for tasks assessed                      General Comments      Exercises     Assessment/Plan    PT Assessment Patient needs continued PT services  PT Problem List Decreased  strength;Decreased range of motion;Decreased activity tolerance;Decreased balance;Decreased mobility;Obesity          PT Treatment Interventions DME instruction;Gait training;Functional mobility training;Therapeutic activities;Therapeutic exercise;Balance training;Patient/family education    PT Goals (Current goals can be found in the Care Plan section)  Acute Rehab PT Goals Patient Stated Goal: to try PT Goal Formulation: With patient Time For Goal Achievement: 05/19/16 Potential to Achieve Goals: Good    Frequency Min  2X/week   Barriers to discharge Decreased caregiver support      Co-evaluation               End of Session   Activity Tolerance: Patient limited by lethargy;Other (comment) (nausea, general malaise) Patient left: in bed;with call bell/phone within reach;with bed alarm set;with family/visitor present      Functional Assessment Tool Used: clinical judgement Functional Limitation: Mobility: Walking and moving around Mobility: Walking and Moving Around Current Status (W2585): At least 40 percent but less than 60 percent impaired, limited or restricted Mobility: Walking and Moving Around Goal Status [phone]): At least 1 percent but less than 20 percent impaired, limited or restricted    Time: 4235-3614 PT Time Calculation (min) (ACUTE ONLY): 21 min   Charges:   PT Evaluation $PT Eval Low Complexity: 1 Procedure     PT G Codes:   PT G-Codes **NOT FOR INPATIENT CLASS** Functional Assessment Tool Used: clinical judgement Functional Limitation: Mobility: Walking and moving around Mobility: Walking and Moving Around Current Status (E3154): At least 40 percent but less than 60 percent impaired, limited or restricted Mobility: Walking and Moving Around Goal Status [phone]): At least 1 percent but less than 20 percent impaired, limited or restricted    Lajuane Leatham H. Owens Shark, PT, DPT, NCS 05/05/16, 9:56 PM [phone]

## 2016-05-05 NOTE — Progress Notes (Signed)
[facility]  ROUNDING NOTE   Subjective:   Daughter at bedside.  Admitted on 05/03/2016 for Weakness [R53.1] Elevated troponin [R74.8] Stage 5 chronic kidney disease not on chronic dialysis [facility]) [N18.5]   Patient with nausea and is scheduled for upper endoscopy and MRCP.   Objective:  Vital signs in last 24 hours:  Temp:  [97.8 F (36.6 C)-98.9 F (37.2 C)] 97.8 F (36.6 C) (11/03 1417) Pulse Rate:  [77-87] 80 (11/03 1417) Resp:  [16-19] 17 (11/03 1417) BP: (133-164)/(52-108) 143/63 (11/03 1417) SpO2:  [98 %-100 %] 100 % (11/03 1417) Weight:  [87.4 kg (192 lb 11.2 oz)] 87.4 kg (192 lb 11.2 oz) (11/03 0500)  Weight change: 0.318 kg (11.2 oz) Filed Weights   05/03/16 2333 05/05/16 0500  Weight: 87.1 kg (192 lb) 87.4 kg (192 lb 11.2 oz)    Intake/Output: I/O last 3 completed shifts: In: 3050 [P.O.:120; I.V.:1930; IV Piggyback:1000] Out: 600 [Urine:600]   Intake/Output this shift:  Total I/O In: 480 [P.O.:480] Out: 300 [Urine:300]  Physical Exam: General: NAD, laying in bed  Head: Normocephalic, atraumatic. Moist oral mucosal membranes  Eyes: Anicteric, PERRL  Neck: Supple, trachea midline  Lungs:  Clear to auscultation  Heart: Regular rate and rhythm  Abdomen:  Soft, nontender,   Extremities: no peripheral edema.  Neurologic: Nonfocal, moving all four extremities  Skin: No lesions  Access: none    Basic Metabolic Panel:  Recent Labs Lab 05/04/16 0010 05/05/16 0359  NA 140 139  K 4.8 4.1  CL 108 113*  CO2 20* 22  GLUCOSE 177* 122*  BUN 43* 38*  CREATININE 4.15* 3.62*  CALCIUM 9.4 8.1*    Liver Function Tests:  Recent Labs Lab 05/04/16 0010  AST 39  ALT 15  ALKPHOS 86  BILITOT 0.8  PROT 7.8  ALBUMIN 4.0   No results for input(s): LIPASE, AMYLASE in the last 168 hours. No results for input(s): AMMONIA in the last 168 hours.  CBC:  Recent Labs Lab 05/04/16 0010  WBC 10.4  NEUTROABS 8.0*  HGB 12.2  HCT 36.6  MCV 88.7   PLT 119*    Cardiac Enzymes:  Recent Labs Lab 05/04/16 0010 05/04/16 0522 05/04/16 1153 05/04/16 1648  TROPONINI 0.62* 0.60* 0.47* 0.37*    BNP: Invalid input(s): POCBNP  CBG:  Recent Labs Lab 05/04/16 1153 05/04/16 1610 05/04/16 2044 05/05/16 0710 05/05/16 1143  GLUCAP 100* 150* 143* 104* 113*    Microbiology: No results found for this or any previous visit.  Coagulation Studies: No results for input(s): LABPROT, INR in the last 72 hours.  Urinalysis:  Recent Labs  05/04/16 0301  COLORURINE YELLOW*  LABSPEC 1.012  PHURINE 6.0  GLUCOSEU >500*  HGBUR 2+*  BILIRUBINUR NEGATIVE  KETONESUR NEGATIVE  PROTEINUR 100*  NITRITE NEGATIVE  LEUKOCYTESUR NEGATIVE      Imaging: Ct Abdomen Pelvis Wo Contrast  Result Date: 05/04/2016 CLINICAL DATA:  Acute onset of nausea and vomiting. Confusion and generalized weakness. Initial encounter. EXAM: CT ABDOMEN AND PELVIS WITHOUT CONTRAST TECHNIQUE: Multidetector CT imaging of the abdomen and pelvis was performed following the standard protocol without IV contrast. COMPARISON:  Lumbar spine radiographs performed 03/13/2014 FINDINGS: Lower chest: The visualized lung bases are grossly clear. Calcification is noted at the mitral and aortic valves. Mild coronary artery calcification is seen. Hepatobiliary: A small calcified granuloma is noted at the right hepatic dome. The patient is status post cholecystectomy, with clips noted along the gallbladder fossa. Pancreas: There is dilatation of the  pancreatic duct to 7 mm in maximal diameter, of uncertain significance. Distal obstruction cannot be excluded. MRCP or ERCP could be considered for further evaluation, as deemed clinically appropriate. Spleen: The spleen is unremarkable in appearance. Adrenals/Urinary Tract: The adrenal glands are unremarkable in appearance. The kidneys are within normal limits. There is no evidence of hydronephrosis. No renal or ureteral stones are identified.  Mild nonspecific perinephric stranding is noted bilaterally. Stomach/Bowel: Visualized small and large bowel loops are grossly unremarkable, aside from mild extension of multiple small and large bowel loops into a broad-based anterior abdominal wall bulge. Postoperative change is noted about the ascending colon. The stomach is unremarkable in appearance. Vascular/Lymphatic: Scattered calcification is seen along the abdominal aorta and its branches. The abdominal aorta is otherwise grossly unremarkable. The inferior vena cava is grossly unremarkable. No retroperitoneal lymphadenopathy is seen. No pelvic sidewall lymphadenopathy is identified. Reproductive: The bladder is mildly distended and grossly unremarkable. The patient is status post hysterectomy. No suspicious adnexal masses are seen. Other: No additional soft tissue abnormalities are seen. Musculoskeletal: No acute osseous abnormalities are identified. This endplate sclerotic change is noted at L5-S1, with underlying vacuum phenomenon. Mild underlying facet disease is noted. The visualized musculature is unremarkable in appearance. IMPRESSION: 1. No acute abnormality seen to explain the patient's symptoms. 2. Dilatation of the pancreatic duct to 7 mm in maximal diameter, of uncertain significance. Distal obstruction cannot be excluded. MRCP or ERCP could be considered for further evaluation, as deemed clinically appropriate. 3. Calcification at the mitral and aortic valves. Mild coronary artery calcification noted. 4. Scattered aortic atherosclerosis. 5. Mild extension of multiple small and large bowel loops into a broad-based anterior abdominal wall bulge. Visualized bowel loops are otherwise unremarkable. Electronically Signed   By: Garald Balding M.D.   On: 05/04/2016 03:23   Dg Chest Port 1 View  Result Date: 05/04/2016 CLINICAL DATA:  Acute onset of generalized weakness. Nausea and vomiting. Found on floor. Confusion. Initial encounter. EXAM:  PORTABLE CHEST 1 VIEW COMPARISON:  None. FINDINGS: The lungs are well-aerated. There is elevation of the right hemidiaphragm. There is no evidence of focal opacification, pleural effusion or pneumothorax. The cardiomediastinal silhouette is within normal limits. No acute osseous abnormalities are seen. IMPRESSION: Elevation of the right hemidiaphragm.  Lungs remain grossly clear. Electronically Signed   By: Garald Balding M.D.   On: 05/04/2016 00:23     Medications:   . sodium chloride 75 mL/hr at 05/05/16 1348   . allopurinol  100 mg Oral Daily  . anastrozole  1 mg Oral Daily  . aspirin EC  81 mg Oral Daily  . carvedilol  25 mg Oral Daily  . cholecalciferol  5,000 Units Oral Daily  . docusate sodium  100 mg Oral BID  . ferrous gluconate  324 mg Oral Q breakfast  . heparin  5,000 Units Subcutaneous Q8H  . hydrALAZINE  50 mg Oral Q8H  . insulin aspart  0-5 Units Subcutaneous QHS  . insulin aspart  0-9 Units Subcutaneous TID WC  . isosorbide dinitrate  20 mg Oral BID  . latanoprost  1 drop Both Eyes QHS  . linagliptin  5 mg Oral Daily  . multivitamin  1 tablet Oral Q breakfast  . ondansetron (ZOFRAN) IV  4 mg Intravenous Q6H  . pentoxifylline  400 mg Oral Daily  . senna  2 tablet Oral Daily  . thyroid  60 mg Oral QAC breakfast   acetaminophen **OR** acetaminophen, meclizine, prochlorperazine, promethazine  Assessment/ Plan:  [name] is a 75 y.o. black female with long-standing diabetes for over 15 years with complications of retinopathy, history of colon cancer in 2005, breast cancer in 2008, history of severe arthritis and nonsteroidal use  1. 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  - Agree with work up for nausea as this can be diabetic gastroperesis. MRCP and EGD for tomorrow.  - does not have dialysis  access - Continue gentle IV fluids for now. Monitor volume status.   2. Hypertension: blood pressure is at goal.  - carvedilol and hydralazine.   3. Diabetes mellitus type II with chronic kidney disease: hemoglobin A1c at goal     LOS: [place], [place] 11/3/20174:11 PM

## 2016-05-05 NOTE — Progress Notes (Signed)
[place] responded to referral from [facility] about Pt in room 113. Pt was in obvious discomfort from nausea and so after introductions I excused myself and had silent prayer at the door. [facility] is available for follow up as needed.    05/05/16 1200  Clinical Encounter Type  Visited With Patient  Visit Type Initial;Spiritual support  Referral From Chaplain  Spiritual Encounters  Spiritual Needs Prayer

## 2016-05-05 NOTE — Consult Note (Signed)
Patient needs EGD tomorrow and it will be about 4pm until there is anesthesiology available to assist with the case.  Pt to have MRI of abdomen due to abnormal pancreas on [place]. Discussed plans with patient's daughter as patient was sound asleep and did not stir even after exam of abdomen.

## 2016-05-05 NOTE — Consult Note (Signed)
GI Inpatient Consult Note  Reason for Consult: nausea/vomiting    Attending Requesting Consult: Dr. Tressia Miners   History of Present Illness: Alexis Tucker is a 75 y.o. female seen for evaluation of n/v at the request of Dr. Tressia Miners.   Admitted 11/1 for weakness, n/v, and a fall. Found to have elevated troponin, felt by cardiology to have supply demand ischemia. Hx of CKD - Cr around 4 at baseline. Normal WBC, TSH, LFTs.   She reports feeling well until about 2 days ago, acutely awakened with weakness/fall. She reports feeling well day prior to onset of n/v, had normal stool that day, ate normally. Since admission she has had persistent nausea with vomiting about 2-3x/day. She denies any abdominal pain, bloating, GERD symptoms. She denies any NSAID use. Reports her weight has been stable   Last Colonoscopy: 8 years ago - [place]-per patient this was unremarkable-per PCP notes - 2008-ext hem, ileo-colonic anastomosis in cecum, tubular adenoma x1, rec rpt 1 yr for multiple polyps (done in [place]). H/o colon cancer 2005 s/p colectomy.  Last Endoscopy: 8 years ago - per patient unremarkable    Past Medical History:  Past Medical History:  Diagnosis Date  . Anemia    per prior pcp  . Carotid stenosis, asymptomatic 01/2014   mild ICA, severe in E[place], rec rpt [place] 2 years  . Cataracts, bilateral   . CKD (chronic kidney disease) stage 4, GFR 15-29 ml/min (HCC) 01/2012   Cr 3.85, in 3s since 2010, discussing HD and L forearm graft ([place])  . Diabetes type 2, controlled ([place]) 2000s  . Diabetic retinopathy with macular edema ([place]) 03/2014   [place] at [facility] eye on avastin  . DJD (degenerative joint disease)    per prior pcp  . GERD (gastroesophageal reflux disease)   . Glaucoma 03/2014  . History of breast cancer 2008   infiltrating ductal carcinoma s/p mammosite  . History of colon cancer 2006   s/p colectomy  . History of hepatitis A 1990s   from food, resolved  . HTN (hypertension)   .  Macular degeneration    wet R with vision loss, dry L; to see retina specialist  . Neuropathy ([place])    per prior pcp  . OA (osteoarthritis)    knees and hips  . Osteonecrosis (HCC)    hips?  . Post-surgical hypothyroidism     Problem List: Patient Active Problem List   Diagnosis Date Noted  . Nausea and vomiting 05/04/2016  . Elevated troponin 05/04/2016  . Accelerated hypertension 05/04/2016  . Assistance needed with transportation 07/31/2015  . Advanced care planning/counseling discussion 07/27/2015  . Lower back pain 03/14/2014  . Hip pain, bilateral 03/14/2014  . Macular degeneration   . Diabetic retinopathy with macular edema ([place]) 03/03/2014  . Medicare annual wellness visit, subsequent 01/07/2013  . Chronic kidney disease (CKD), stage V ([place]) 01/06/2013  . Carotid stenosis, asymptomatic 12/05/2012  . Post-surgical hypothyroidism   . OA (osteoarthritis)   . Type 2 diabetes mellitus, uncontrolled, with renal complications ([place])   . HTN (hypertension)   . Osteonecrosis ([place])   . History of breast cancer   . History of colon cancer   . GERD (gastroesophageal reflux disease)     Past Surgical History: Past Surgical History:  Procedure Laterality Date  . ABDOMINAL HYSTERECTOMY  1976   after tubal pregnancy, h/o fibroids with heavy bleeding  . BREAST MAMMOSITE  2008  . carotid ultrasound  12/2012   1-39% bilateral stenosis, severe in  [place]  2005  . COLECTOMY  2005   colon cancer  . COLON SURGERY  2006   bowel obstruction  . COLONOSCOPY  2008   ext hem, ileo-colonic anastomosis in cecum, tubular adenoma x1, rec rpt 1 yr for multiple polyps (in [place])  . THYROIDECTOMY, PARTIAL     unclear why  . TOTAL KNEE ARTHROPLASTY Right 1999  . US ECHOCARDIOGRAPHY  11/2007   nl LV fxn EF 67%, diastolic dysfunction, LVH, tr TR and MR, slcerotic aortic valve    Allergies: Allergies  Allergen Reactions  . Codeine   . Tramadol Other (See Comments)    nightmares   . Sulfa Antibiotics Itching and Rash    Home Medications: Prescriptions Prior to Admission  Medication Sig Dispense Refill Last Dose  . acetaminophen (TYLENOL) 500 MG tablet Take 1,000 mg by mouth every 12 (twelve) hours as needed.   prn at prn  . allopurinol (ZYLOPRIM) 100 MG tablet TAKE 1 TABLET BY MOUTH DAILY 90 tablet 3 05/03/2016 at Unknown time  . anastrozole (ARIMIDEX) 1 MG tablet TAKE 1 TABLET BY MOUTH DAILY 90 tablet 1 05/03/2016 at Unknown time  . ARMOUR THYROID 60 MG tablet TAKE 1 TABLET BY MOUTH DAILY -NEED PHYSICAL 30 tablet 11 05/03/2016 at Unknown time  . Blood Glucose Monitoring Suppl (ACURA BLOOD GLUCOSE METER) W/DEVICE KIT Use as directed to check sugars daily 250.42 1 kit 0 Unknown at Unknown  . Calcium-Magnesium-Vitamin D (CALCIUM MAGNESIUM PO) Take 1 tablet by mouth daily.   Unknown at Unknown  . carvedilol (COREG) 25 MG tablet TAKE 1 TABLET BY MOUTH DAILY 90 tablet 2 05/03/2016 at Unknown time  . Folic Acid-Vit J4-GBE E10 (FOLBEE) 2.5-25-1 MG TABS tablet Take 1 tablet by mouth daily.   Unknown at Unknown  . glimepiride (AMARYL) 1 MG tablet TAKE ONE TABLET BY MOUTH EACH MORNING BEFORE BREAKFAST -- NEED PHYSICAL 30 tablet 11 05/03/2016 at 1000  . hydrALAZINE (APRESOLINE) 50 MG tablet Take 1 tablet (50 mg total) by mouth daily. 90 tablet 2 05/03/2016 at Unknown time  . isosorbide dinitrate (ISORDIL) 20 MG tablet TAKE 1 TABLET BY MOUTH DAILY 90 tablet 3 05/03/2016 at Unknown time  . latanoprost (XALATAN) 0.005 % ophthalmic solution Place 1 drop into both eyes at bedtime.   Unknown at Unknown time  . ONGLYZA 5 MG TABS tablet TAKE 1 TABLET BY MOUTH DAILY 30 tablet 11 05/03/2016 at Unknown time  . pentoxifylline (TRENTAL) 400 MG CR tablet TAKE 1 TABLET BY MOUTH DAILY 90 tablet 3 05/03/2016 at Unknown time  . Cholecalciferol (VITAMIN D-3) 5000 UNITS TABS Take 1 tablet by mouth daily.   Unknown at Unknown  . FERROUS GLUCONATE PO Take 28 mg by mouth daily.   Not Taking at Unknown time    Home medication reconciliation was completed with the patient.   Scheduled Inpatient Medications:   . allopurinol  100 mg Oral Daily  . anastrozole  1 mg Oral Daily  . aspirin EC  81 mg Oral Daily  . carvedilol  25 mg Oral Daily  . cholecalciferol  5,000 Units Oral Daily  . docusate sodium  100 mg Oral BID  . ferrous gluconate  324 mg Oral Q breakfast  . heparin  5,000 Units Subcutaneous Q8H  . hydrALAZINE  50 mg Oral Q8H  . insulin aspart  0-5 Units Subcutaneous QHS  . insulin aspart  0-9 Units Subcutaneous TID WC  . isosorbide dinitrate  20 mg Oral BID  . latanoprost  1 drop Both Eyes QHS  . linagliptin  5 mg Oral Daily  . multivitamin  1 tablet Oral Q breakfast  . ondansetron (ZOFRAN) IV  4 mg Intravenous Q6H  . pentoxifylline  400 mg Oral Daily  . senna  2 tablet Oral Daily  . thyroid  60 mg Oral QAC breakfast    Continuous Inpatient Infusions:   . sodium chloride 75 mL/hr at 05/05/16 1348    PRN Inpatient Medications:  acetaminophen **OR** acetaminophen, meclizine, prochlorperazine, promethazine  Family History: family history includes Alcohol abuse in her father; Arthritis in her father and mother; Diabetes in her mother; Hypertension in her mother; Stroke in her daughter.     Social History:   reports that she has quit smoking. She has never used smokeless tobacco. She reports that she does not drink alcohol or use drugs. The patient denies ETOH, tobacco, or drug use.   Review of Systems: Constitutional: Weight is stable.  Eyes: No changes in vision. ENT: No oral lesions, sore throat.  GI: see HPI.  Heme/Lymph: No easy bruising.  CV: No chest pain.  GU: No hematuria.  Integumentary: No rashes.  Neuro: No headaches.  Psych: No depression/anxiety.  Endocrine: No heat/cold intolerance.  Allergic/Immunologic: No urticaria.  Resp: No cough, SOB.  Musculoskeletal: No joint swelling.    Physical Examination: BP (!) 143/63 (BP Location: Right Arm)   Pulse  80   Temp 97.8 F (36.6 C)   Resp 17   Ht _0  (1.626 m)   Wt 87.4 kg (192 lb 11.2 oz)   SpO2 100%   BMI 33.08 kg/m  Gen: NAD, alert and oriented x 4 HEENT: PEERLA, EOMI, Neck: supple, no JVD or thyromegaly Chest: CTA bilaterally, no wheezes, crackles, or other adventitious sounds CV: RRR, no m/g/c/r Abd: soft, NT, ND,  no TTP to deep palpation-patient began vomiting after deep palpation. + hypoactive BS. Ext: no edema, well perfused with 2+ pulses, Skin: no rash or lesions noted Lymph: no LAD  Data: Lab Results  Component Value Date   WBC 10.4 05/04/2016   HGB 12.2 05/04/2016   HCT 36.6 05/04/2016   MCV 88.7 05/04/2016   PLT 119 (L) 05/04/2016    Recent Labs Lab 05/04/16 0010  HGB 12.2   Lab Results  Component Value Date   NA 139 05/05/2016   K 4.1 05/05/2016   CL 113 (H) 05/05/2016   CO2 22 05/05/2016   BUN 38 (H) 05/05/2016   CREATININE 3.62 (H) 05/05/2016   Lab Results  Component Value Date   ALT 15 05/04/2016   AST 39 05/04/2016   ALKPHOS 86 05/04/2016   BILITOT 0.8 05/04/2016   No results for input(s): APTT, INR, PTT in the last 168 hours.    CT a/p IMPRESSION: 1. No acute abnormality seen to explain the patient's symptoms. 2. Dilatation of the pancreatic duct to 7 mm in maximal diameter, of uncertain significance. Distal obstruction cannot be excluded. MRCP or ERCP could be considered for further evaluation, as deemed clinically appropriate. 3. Calcification at the mitral and aortic valves. Mild coronary artery calcification noted. 4. Scattered aortic atherosclerosis. 5. Mild extension of multiple small and large bowel loops into a broad-based anterior abdominal wall bulge. Visualized bowel loops are otherwise unremarkable.   Assessment/Plan: Ms. Coover is a 75 y.o. female admitted for nausea/vomiting. Unclear etiology. Will get MRCP given panc duct abnormality but alk phos/bilirubin normal. CBC, CMP, TSH unremarkable. Check lipase. UA with  elevated glucose but no WBCs  or leukocytes. No abd pain/nsaid use/bleeding to definitively suggest ulcer. No abd distention on exam. Will set up for EGD tomorrow. Clear liquid diet and anti-emetics in interim.     Recommendations: 1. Clear liquid, NPO at MN, EGD tomorrow AM 2. MRCP 3. Lipase   * case discussed w/ Dr. Vira Agar   Addendum - patient asked specifically about colon surgeries after chart review-had resection for CRC in 0910, small umbilical scar. Patient updated w/ care plan regarding MRCP and EGD.  Thank you for the consult. Please call with questions or concerns.  Laurine Blazer, PA-C [place]

## 2016-05-06 ENCOUNTER — Observation Stay: Payer: Medicare Other | Admitting: Registered Nurse

## 2016-05-06 ENCOUNTER — Encounter
Admission: EM | Disposition: A | Discharge status: Home / Self Care | Payer: Self-pay | Source: Home / Self Care | Attending: Emergency Medicine

## 2016-05-06 ENCOUNTER — Observation Stay: Payer: Medicare Other

## 2016-05-06 ENCOUNTER — Encounter: Payer: Self-pay | Admitting: Anesthesiology

## 2016-05-06 DIAGNOSIS — I120 Hypertensive chronic kidney disease with stage 5 chronic kidney disease or end stage renal disease: Secondary | ICD-10-CM | POA: Diagnosis not present

## 2016-05-06 DIAGNOSIS — E872 Acidosis: Secondary | ICD-10-CM | POA: Diagnosis not present

## 2016-05-06 DIAGNOSIS — K2950 Unspecified chronic gastritis without bleeding: Secondary | ICD-10-CM | POA: Diagnosis not present

## 2016-05-06 DIAGNOSIS — K210 Gastro-esophageal reflux disease with esophagitis: Secondary | ICD-10-CM | POA: Diagnosis not present

## 2016-05-06 DIAGNOSIS — N185 Chronic kidney disease, stage 5: Secondary | ICD-10-CM | POA: Diagnosis not present

## 2016-05-06 DIAGNOSIS — K317 Polyp of stomach and duodenum: Secondary | ICD-10-CM | POA: Diagnosis not present

## 2016-05-06 DIAGNOSIS — K529 Noninfective gastroenteritis and colitis, unspecified: Secondary | ICD-10-CM | POA: Diagnosis not present

## 2016-05-06 DIAGNOSIS — R935 Abnormal findings on diagnostic imaging of other abdominal regions, including retroperitoneum: Secondary | ICD-10-CM | POA: Diagnosis not present

## 2016-05-06 DIAGNOSIS — K3189 Other diseases of stomach and duodenum: Secondary | ICD-10-CM | POA: Diagnosis not present

## 2016-05-06 DIAGNOSIS — I739 Peripheral vascular disease, unspecified: Secondary | ICD-10-CM | POA: Diagnosis not present

## 2016-05-06 DIAGNOSIS — I4891 Unspecified atrial fibrillation: Secondary | ICD-10-CM | POA: Diagnosis not present

## 2016-05-06 DIAGNOSIS — K269 Duodenal ulcer, unspecified as acute or chronic, without hemorrhage or perforation: Secondary | ICD-10-CM | POA: Diagnosis not present

## 2016-05-06 DIAGNOSIS — E1122 Type 2 diabetes mellitus with diabetic chronic kidney disease: Secondary | ICD-10-CM | POA: Diagnosis not present

## 2016-05-06 DIAGNOSIS — N29 Other disorders of kidney and ureter in diseases classified elsewhere: Secondary | ICD-10-CM | POA: Diagnosis not present

## 2016-05-06 DIAGNOSIS — R809 Proteinuria, unspecified: Secondary | ICD-10-CM | POA: Diagnosis not present

## 2016-05-06 DIAGNOSIS — K449 Diaphragmatic hernia without obstruction or gangrene: Secondary | ICD-10-CM | POA: Diagnosis not present

## 2016-05-06 DIAGNOSIS — R112 Nausea with vomiting, unspecified: Secondary | ICD-10-CM | POA: Diagnosis not present

## 2016-05-06 HISTORY — PX: ESOPHAGOGASTRODUODENOSCOPY: SHX5428

## 2016-05-06 LAB — GLUCOSE, CAPILLARY
Glucose-Capillary: 89 mg/dL (ref 65–99)
Glucose-Capillary: 113 mg/dL — ABNORMAL HIGH (ref 65–99)
Glucose-Capillary: 117 mg/dL — ABNORMAL HIGH (ref 65–99)
Glucose-Capillary: 111 mg/dL — ABNORMAL HIGH (ref 65–99)

## 2016-05-06 LAB — BASIC METABOLIC PANEL
Anion gap: 4 — ABNORMAL LOW (ref 5–15)
BUN: 33 mg/dL — AB (ref 6–20)
CALCIUM: 8.4 mg/dL — AB (ref 8.9–10.3)
CHLORIDE: 116 mmol/L — AB (ref 101–111)
CO2: 21 mmol/L — ABNORMAL LOW (ref 22–32)
CREATININE: 3.87 mg/dL — AB (ref 0.44–1.00)
GFR calc non Af Amer: 11 mL/min — ABNORMAL LOW (ref >60)
GFR, EST AFRICAN AMERICAN: 12 mL/min — AB (ref >60)
Glucose, Bld: 90 mg/dL (ref 65–99)
Potassium: 3.8 mmol/L (ref 3.5–5.1)
SODIUM: 141 mmol/L (ref 135–145)

## 2016-05-06 SURGERY — EGD (ESOPHAGOGASTRODUODENOSCOPY)
Anesthesia: Monitor Anesthesia Care

## 2016-05-06 SURGERY — EGD (ESOPHAGOGASTRODUODENOSCOPY)
Anesthesia: General

## 2016-05-06 SURGERY — UPPER ENDOSCOPIC ULTRASOUND (EUS) RADIAL
Anesthesia: Choice

## 2016-05-06 MED ORDER — PROPOFOL 10 MG/ML IV BOLUS
INTRAVENOUS | Status: DC | PRN
  Administered 2016-05-06: 50 mg via INTRAVENOUS
  Administered 2016-05-06: 10 mg via INTRAVENOUS

## 2016-05-06 MED ORDER — METOPROLOL TARTRATE 5 MG/5ML IV SOLN
3.0000 mg | Freq: Once | INTRAVENOUS | Status: AC
  Administered 2016-05-06: 3 mg via INTRAVENOUS
  Filled 2016-05-06: qty 5

## 2016-05-06 MED ORDER — ONDANSETRON HCL 4 MG/2ML IJ SOLN
4.0000 mg | Freq: Once | INTRAMUSCULAR | Status: DC | PRN
  Filled 2016-05-06: qty 2

## 2016-05-06 MED ORDER — PANTOPRAZOLE SODIUM 40 MG PO TBEC
40.0000 mg | DELAYED_RELEASE_TABLET | Freq: Two times a day (BID) | ORAL | Status: DC
  Administered 2016-05-06 – 2016-05-08 (×4): 40 mg via ORAL
  Filled 2016-05-06 (×4): qty 1

## 2016-05-06 MED ORDER — LIDOCAINE HCL (CARDIAC) 20 MG/ML IV SOLN
INTRAVENOUS | Status: DC | PRN
  Administered 2016-05-06: 40 mg via INTRAVENOUS

## 2016-05-06 MED ORDER — PROPOFOL 500 MG/50ML IV EMUL
INTRAVENOUS | Status: DC | PRN
  Administered 2016-05-06: 70 ug/kg/min via INTRAVENOUS

## 2016-05-06 MED ORDER — FENTANYL CITRATE (PF) 100 MCG/2ML IJ SOLN: 25.0000 ug | INTRAMUSCULAR | Status: DC | PRN

## 2016-05-06 MED ORDER — METOPROLOL TARTRATE 5 MG/5ML IV SOLN
2.0000 mg | Freq: Once | INTRAVENOUS | Status: AC
  Administered 2016-05-06: 2 mg via INTRAVENOUS
  Filled 2016-05-06: qty 5

## 2016-05-06 MED ORDER — SODIUM CHLORIDE 0.9 % IV SOLN
INTRAVENOUS | Status: DC
  Administered 2016-05-06: 09:00:00 via INTRAVENOUS

## 2016-05-06 NOTE — Progress Notes (Signed)
CCMD called to advise that the pt's telemetry indicated new onset afib this shift; advised CCMD pt currently in Endo, will call them to advise

## 2016-05-06 NOTE — Anesthesia Preprocedure Evaluation (Addendum)
Anesthesia Evaluation  Patient identified by MRN, date of birth, ID band Patient awake    Reviewed: Allergy & Precautions, NPO status , Patient's Chart, lab work & pertinent test results, reviewed documented beta blocker date and time   Airway Mallampati: II  TM Distance: >3 FB     Dental  (+) Chipped   Pulmonary former smoker,    Pulmonary exam normal        Cardiovascular hypertension, Pt. on medications and Pt. on home beta blockers + Peripheral Vascular Disease  Normal cardiovascular exam     Neuro/Psych negative neurological ROS  negative psych ROS   GI/Hepatic Neg liver ROS, GERD  ,  Endo/Other  diabetesHypothyroidism   Renal/GU Renal InsufficiencyRenal disease     Musculoskeletal  (+) Arthritis , Osteoarthritis,    Abdominal Normal abdominal exam  (+)   Peds  Hematology  (+) anemia ,   Anesthesia Other Findings   Reproductive/Obstetrics                           Anesthesia Physical Anesthesia Plan  ASA: IV and emergent  Anesthesia Plan: General   Post-op Pain Management:    Induction: Intravenous  Airway Management Planned: Nasal Cannula  Additional Equipment:   Intra-op Plan:   Post-operative Plan:   Informed Consent: I have reviewed the patients History and Physical, chart, labs and discussed the procedure including the risks, benefits and alternatives for the proposed anesthesia with the patient or authorized representative who has indicated his/her understanding and acceptance.   Dental advisory given  Plan Discussed with: CRNA and Surgeon  Anesthesia Plan Comments:         Anesthesia Quick Evaluation

## 2016-05-06 NOTE — Progress Notes (Signed)
Called Recovery Room and spoke with Jocelyn RN to advise that CCMD just called to advise that the pt has a new onset of afib; Jocelyn advised that they were in the process of treating this

## 2016-05-06 NOTE — Progress Notes (Signed)
[facility]  ROUNDING NOTE   Subjective:   Daughter at bedside.  MRCP for later today.  EGD for later today.   Patient states nausea has improved some.   Objective:  Vital signs in last 24 hours:  Temp:  [97.8 F (36.6 C)-99.1 F (37.3 C)] 98.6 F (37 C) (11/04 0430) Pulse Rate:  [80-85] 85 (11/04 0430) Resp:  [17] 17 (11/04 0430) BP: (128-143)/(50-63) 128/50 (11/04 0430) SpO2:  [98 %-100 %] 98 % (11/04 0430) Weight:  [90.7 kg (200 lb)] 90.7 kg (200 lb) (11/04 0500)  Weight change: 3.311 kg (7 lb 4.8 oz) Filed Weights   05/03/16 2333 05/05/16 0500 05/06/16 0500  Weight: 87.1 kg (192 lb) 87.4 kg (192 lb 11.2 oz) 90.7 kg (200 lb)    Intake/Output: I/O last 3 completed shifts: In: 2282.1 [P.O.:480; I.V.:1802.1] Out: 600 [Urine:600]   Intake/Output this shift:  Total I/O In: 1557 [P.O.:760; I.V.:797] Out: 250 [Urine:250]  Physical Exam: General: NAD, laying in bed  Head: Normocephalic, atraumatic. Moist oral mucosal membranes  Eyes: Anicteric, PERRL  Neck: Supple, trachea midline  Lungs:  Clear to auscultation  Heart: Regular rate and rhythm  Abdomen:  Soft, nontender,   Extremities: no peripheral edema.  Neurologic: Nonfocal, moving all four extremities  Skin: No lesions  Access: none    Basic Metabolic Panel:  Recent Labs Lab 05/04/16 0010 05/05/16 0359 05/06/16 0536  NA 140 139 141  K 4.8 4.1 3.8  CL 108 113* 116*  CO2 20* 22 21*  GLUCOSE 177* 122* 90  BUN 43* 38* 33*  CREATININE 4.15* 3.62* 3.87*  CALCIUM 9.4 8.1* 8.4*    Liver Function Tests:  Recent Labs Lab 05/04/16 0010  AST 39  ALT 15  ALKPHOS 86  BILITOT 0.8  PROT 7.8  ALBUMIN 4.0    Recent Labs Lab 05/05/16 0359  LIPASE 40   No results for input(s): AMMONIA in the last 168 hours.  CBC:  Recent Labs Lab 05/04/16 0010  WBC 10.4  NEUTROABS 8.0*  HGB 12.2  HCT 36.6  MCV 88.7  PLT 119*    Cardiac Enzymes:  Recent Labs Lab 05/04/16 0010  05/04/16 0522 05/04/16 1153 05/04/16 1648  TROPONINI 0.62* 0.60* 0.47* 0.37*    BNP: Invalid input(s): POCBNP  CBG:  Recent Labs Lab 05/05/16 0710 05/05/16 1143 05/05/16 1635 05/05/16 2040 05/06/16 0802  GLUCAP 104* 113* 162* 100* 89    Microbiology: No results found for this or any previous visit.  Coagulation Studies: No results for input(s): LABPROT, INR in the last 72 hours.  Urinalysis:  Recent Labs  05/04/16 0301  COLORURINE YELLOW*  LABSPEC 1.012  PHURINE 6.0  GLUCOSEU >500*  HGBUR 2+*  BILIRUBINUR NEGATIVE  KETONESUR NEGATIVE  PROTEINUR 100*  NITRITE NEGATIVE  LEUKOCYTESUR NEGATIVE      Imaging: No results found.   Medications:   . sodium chloride 50 mL/hr at 05/05/16 1655  . sodium chloride 20 mL/hr at 05/06/16 0853   . allopurinol  100 mg Oral Daily  . anastrozole  1 mg Oral Daily  . aspirin EC  81 mg Oral Daily  . carvedilol  25 mg Oral Daily  . cholecalciferol  5,000 Units Oral Daily  . docusate sodium  100 mg Oral BID  . ferrous gluconate  324 mg Oral Q breakfast  . heparin  5,000 Units Subcutaneous Q8H  . hydrALAZINE  50 mg Oral Q8H  . insulin aspart  0-5 Units Subcutaneous QHS  . insulin aspart  0-9 Units Subcutaneous TID WC  . isosorbide dinitrate  20 mg Oral BID  . latanoprost  1 drop Both Eyes QHS  . linagliptin  5 mg Oral Daily  . multivitamin  1 tablet Oral Q breakfast  . ondansetron (ZOFRAN) IV  4 mg Intravenous Q6H  . pentoxifylline  400 mg Oral Daily  . senna  2 tablet Oral Daily  . thyroid  60 mg Oral QAC breakfast   acetaminophen **OR** acetaminophen, meclizine, prochlorperazine, promethazine  Assessment/ Plan:  [name] is a 75 y.o. black female with long-standing diabetes for over 15 years with complications of retinopathy, history of colon cancer in 2005, breast cancer in 2008, history of severe arthritis and nonsteroidal use  1. 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  - Agree with work up for nausea as this can be diabetic gastroperesis. MRCP and EGD for today - does not have dialysis access. Discussed case with Dr. Lucky Cowboy, plan for access on Monday, family seems agreeable.   2. Hypertension: blood pressure is at goal.  - carvedilol and hydralazine.   3. Diabetes mellitus type II with chronic kidney disease: hemoglobin A1c at goal     LOS: [place], [place] 11/4/201711:23 AM

## 2016-05-06 NOTE — OR Nursing (Signed)
1343 med given 3mg  of lopressor IV

## 2016-05-06 NOTE — Op Note (Signed)
[facility] Patient Name: Alexis Tucker Procedure Date: 05/06/2016 12:29 PM MRN: [id number] Account #: [id number] Date of Birth: [birth year] Admit Type: Inpatient Age: 75 Room: [facility] ENDO ROOM 4 Gender: Female Note Status: Finalized Procedure:            Upper GI endoscopy Indications:          Persistent vomiting Providers:            Manya Silvas, MD Medicines:            Propofol per Anesthesia Complications:        No immediate complications. Procedure:            Pre-Anesthesia Assessment:                       - After reviewing the risks and benefits, the patient                        was deemed in satisfactory condition to undergo the                        procedure.                       After obtaining informed consent, the endoscope was                        passed under direct vision. Throughout the procedure,                        the patient's blood pressure, pulse, and oxygen                        saturations were monitored continuously. The Endoscope                        was introduced through the mouth, and advanced to the                        second part of duodenum. The upper GI endoscopy was                        accomplished without difficulty. The patient tolerated                        the procedure well. Findings:      Many non-bleeding superficial duodenal ulcers with no stigmata of       bleeding were found in the second portion of the duodenum. The largest       lesion was 3 mm in largest dimension.      Multiple small-medium-sized sessile polyps with no bleeding were found       in the duodenal bulb. 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 Likely       due to recurrent vomiting. Also  submucosal polypoid swellings in antrum.      LA Grade A (one or more mucosal breaks less than 5  mm, not extending       between tops of 2 mucosal folds) esophagitis with no bleeding was found       in the lower third of the esophagus. Impression:           - Multiple non-bleeding duodenal ulcers with no                        stigmata of bleeding.                       - Multiple duodenal polyps.                       - Gastritis.                       - LA Grade A reflux esophagitis.                       - No specimens collected. Recommendation:       - The findings and recommendations were discussed with                        the patient's family. Anti nausea medication like                        Zofran around the clock for 48 hours. Clear liquid diet. Manya Silvas, MD 05/06/2016 1:25:55 PM This report has been signed electronically. Number of Addenda: 0 Note Initiated On: 05/06/2016 12:29 PM      [facility]

## 2016-05-06 NOTE — Consult Note (Signed)
Patient EGD showed significant inflammation in duodenal bulb, some in proximal stomach.  Will start Zofran around the clock.

## 2016-05-06 NOTE — OR Nursing (Signed)
1358 2mg  lopressor iv Dr. Kayleen Memos at bedside

## 2016-05-06 NOTE — Progress Notes (Signed)
[facility] NAME: Alexis Tucker    MR#:  [id number]  DATE OF BIRTH:  Sep 05, 1940  SUBJECTIVE:  CHIEF COMPLAINT:  No chief complaint on file.  -Continues to complain of nausea and dizziness. Nephrology recommended dialysis initiation. Permacath to be placed on Monday. -For EGD today and also MRCP ordered  REVIEW OF SYSTEMS:  Review of Systems  Constitutional: Negative for chills, fever and malaise/fatigue.  HENT: Negative for ear discharge, ear pain, nosebleeds and tinnitus.   Eyes: Negative for blurred vision and double vision.  Respiratory: Negative for cough, shortness of breath and wheezing.   Cardiovascular: Negative for chest pain, palpitations and leg swelling.  Gastrointestinal: Positive for nausea. Negative for abdominal pain, constipation, diarrhea and vomiting.  Genitourinary: Negative for dysuria and urgency.  Musculoskeletal: Negative for myalgias and neck pain.  Neurological: Positive for dizziness. Negative for speech change, focal weakness, seizures and headaches.  Psychiatric/Behavioral: Negative for depression.    DRUG ALLERGIES:   Allergies  Allergen Reactions  . Codeine   . Tramadol Other (See Comments)    nightmares  . Sulfa Antibiotics Itching and Rash    VITALS:  Blood pressure 138/66, pulse 85, temperature 97.5 F (36.4 C), resp. rate 20, height 5\' 4"  (1.626 m), weight 90.7 kg (200 lb), SpO2 100 %.  PHYSICAL EXAMINATION:  Physical Exam  GENERAL:  75 y.o.-year-old patient lying in the bed with no acute distress.  EYES: Pupils equal, round, reactive to light and accommodation. No scleral icterus. Extraocular muscles intact.  HEENT: Head atraumatic, normocephalic. Oropharynx and nasopharynx clear.  NECK:  Supple, no jugular venous distention. No thyroid enlargement, no tenderness.  LUNGS: Normal breath sounds bilaterally, no wheezing, rales,rhonchi or crepitation. No use of accessory muscles of  respiration.  CARDIOVASCULAR: S1, S2 normal. No murmurs, rubs, or gallops.  ABDOMEN: Soft, nontender, nondistended. Bowel sounds present. No organomegaly or mass.  EXTREMITIES: No pedal edema, cyanosis, or clubbing.  NEUROLOGIC: Cranial nerves II through XII are intact. Muscle strength 5/5 in all extremities. Sensation intact. Gait not checked. No nystagmus. Global weakness noted. PSYCHIATRIC: The patient is alert and oriented x 3.  SKIN: No obvious rash, lesion, or ulcer.    LABORATORY PANEL:   CBC  Recent Labs Lab 05/04/16 0010  WBC 10.4  HGB 12.2  HCT 36.6  PLT 119*   ------------------------------------------------------------------------------------------------------------------  Chemistries   Recent Labs Lab 05/04/16 0010  05/06/16 0536  NA 140  < > 141  K 4.8  < > 3.8  CL 108  < > 116*  CO2 20*  < > 21*  GLUCOSE 177*  < > 90  BUN 43*  < > 33*  CREATININE 4.15*  < > 3.87*  CALCIUM 9.4  < > 8.4*  AST 39  --   --   ALT 15  --   --   ALKPHOS 86  --   --   BILITOT 0.8  --   --   < > = values in this interval not displayed. ------------------------------------------------------------------------------------------------------------------  Cardiac Enzymes  Recent Labs Lab 05/04/16 1648  TROPONINI 0.37*   ------------------------------------------------------------------------------------------------------------------  RADIOLOGY:  No results found.  EKG:   Orders placed or performed during the hospital encounter of 05/03/16  . ED EKG  . ED EKG  . EKG 12-Lead  . EKG 12-Lead    ASSESSMENT AND PLAN:   75 year old female with past medical history significant for CK D stage IV, diabetes, diabetic retinopathy, GERD, hypertension,  anemia him a hypertension, neuropathy presents from home secondary to worsening nausea and dizziness. Also complained of chest pain on admission.  #1 nausea and dizziness-gastroenteritis or uremia  -gentle hydration.  Symptomatically treatment for nausea and dizziness. Meclizine added -On a clear liquid diet. GI consulted. CT of the abdomen with dilated pancreatic duct, also MRCP ordered today. -EGD for this afternoon- -negative for orthostatic blood pressure changes  #2 Elevated troponin- likely demand ischemia and poor renal clearance - no chest pain now,  - plateaued troponins, ECHO with normal EF, no wall motion abnormalities and mild diastolic dysfunction noted and appreciate cards consult - asa, coreg, imdur, added statin - Outpatient stress test recommended  #3 DM- linagliptin, SSI -Sugars have been borderline low due to her poor intake  #4 history of breast cancer-on Arimidex  #5 CKD stage 5- Creatinine is close to baseline and GFR remains less than 15. -Follows with Dr. Candiss Norse as an outpatient. Due to persistent nausea and CK D stage V, not sure if this is uremic symptoms.  -Appreciate nephrology consult. Recommended initiation of dialysis during this hospitalization. If family and patient are agreeable, permacath will be placed Monday  #6 DVT Prophylaxis- heparin SQ  Physical Therapy consulted.   All the records are reviewed and case discussed with Care Management/Social Workerr. Management plans discussed with the patient, family and they are in agreement.  CODE STATUS: Full code  TOTAL TIME TAKING CARE OF THIS PATIENT: 38 minutes.   POSSIBLE D/C IN 1-2 DAYS, DEPENDING ON CLINICAL CONDITION.   Jackalyn Haith M.D on 05/06/2016 at 1:30 PM  Between 7am to 6pm - Pager - [phone]  After 6pm go to [email or web address] - password [email or web address] [facility]  Office  [phone]  CC: Primary care physician; Ria Bush, MD

## 2016-05-06 NOTE — Transfer of Care (Signed)
Immediate Anesthesia Transfer of Care Note  Patient: Alexis Tucker  Procedure(s) Performed: Procedure(s): ESOPHAGOGASTRODUODENOSCOPY (EGD) (N/A)  Patient Location: PACU  Anesthesia Type:General  Level of Consciousness: sedated  Airway & Oxygen Therapy: Patient Spontanous Breathing and Patient connected to face mask oxygen  Post-op Assessment: Report given to RN and Post -op Vital signs reviewed and stable  Post vital signs: Reviewed and stable  Last Vitals:  Vitals:   05/06/16 0430 05/06/16 1320  BP: (!) 128/50 138/66  Pulse: 85   Resp: 17 20  Temp: 37 C 16.1 C    Complications: No apparent anesthesia complications

## 2016-05-06 NOTE — Anesthesia Postprocedure Evaluation (Signed)
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  Controlled with cardizem.  Hospitalist consulted for management after verification by 12 lead EKG    Last Vitals:  Vitals:   05/06/16 1334 05/06/16 1349  BP: (!) 148/65 138/65  Pulse:    Resp:    Temp:      Last Pain:  Vitals:   05/06/16 1320  TempSrc:   PainSc: 0-No pain                 Tammy Ericsson

## 2016-05-06 NOTE — Anesthesia Procedure Notes (Signed)
Date/Time: 05/06/2016 1:04 PM Performed by: Doreen Salvage Pre-anesthesia Checklist: Patient identified, Emergency Drugs available, Suction available and Patient being monitored Patient Re-evaluated:Patient Re-evaluated prior to inductionOxygen Delivery Method: Nasal cannula Intubation Type: IV induction Dental Injury: Teeth and Oropharynx as per pre-operative assessment  Comments: Nasal cannula with etCO2 monitoring

## 2016-05-06 NOTE — Progress Notes (Signed)
PT Cancellation Note  Patient Details Name: Alexis Tucker MRN: [id number] DOB: [birth year]   Cancelled Treatment:    Reason Eval/Treat Not Completed: Patient at procedure or test/unavailable (Patient currently off unit for diagnostic procedure.  Will re-attempt at later time/date as available and medically appropriate.)   Akshitha Culmer H. Owens Shark, PT, DPT, NCS 05/06/16, 4:26 PM [phone]

## 2016-05-06 NOTE — OR Nursing (Signed)
Patient to see hospitatlist

## 2016-05-07 DIAGNOSIS — N185 Chronic kidney disease, stage 5: Secondary | ICD-10-CM | POA: Diagnosis not present

## 2016-05-07 DIAGNOSIS — R809 Proteinuria, unspecified: Secondary | ICD-10-CM | POA: Diagnosis not present

## 2016-05-07 DIAGNOSIS — K529 Noninfective gastroenteritis and colitis, unspecified: Secondary | ICD-10-CM | POA: Diagnosis not present

## 2016-05-07 DIAGNOSIS — I482 Chronic atrial fibrillation, unspecified: Secondary | ICD-10-CM

## 2016-05-07 DIAGNOSIS — E872 Acidosis: Secondary | ICD-10-CM | POA: Diagnosis not present

## 2016-05-07 DIAGNOSIS — I480 Paroxysmal atrial fibrillation: Secondary | ICD-10-CM

## 2016-05-07 DIAGNOSIS — E1122 Type 2 diabetes mellitus with diabetic chronic kidney disease: Secondary | ICD-10-CM | POA: Diagnosis not present

## 2016-05-07 DIAGNOSIS — I120 Hypertensive chronic kidney disease with stage 5 chronic kidney disease or end stage renal disease: Secondary | ICD-10-CM | POA: Diagnosis not present

## 2016-05-07 LAB — COMPREHENSIVE METABOLIC PANEL
ALT: 13 U/L — ABNORMAL LOW (ref 14–54)
AST: 26 U/L (ref 15–41)
Albumin: 2.9 g/dL — ABNORMAL LOW (ref 3.5–5.0)
Alkaline Phosphatase: 61 U/L (ref 38–126)
Anion gap: 6 (ref 5–15)
BUN: 31 mg/dL — AB (ref 6–20)
CHLORIDE: 113 mmol/L — AB (ref 101–111)
CO2: 22 mmol/L (ref 22–32)
Calcium: 8.4 mg/dL — ABNORMAL LOW (ref 8.9–10.3)
Creatinine, Ser: 3.94 mg/dL — ABNORMAL HIGH (ref 0.44–1.00)
GFR, EST AFRICAN AMERICAN: 12 mL/min — AB (ref >60)
GFR, EST NON AFRICAN AMERICAN: 10 mL/min — AB (ref >60)
Glucose, Bld: 93 mg/dL (ref 65–99)
POTASSIUM: 3.7 mmol/L (ref 3.5–5.1)
SODIUM: 141 mmol/L (ref 135–145)
Total Bilirubin: 0.6 mg/dL (ref 0.3–1.2)
Total Protein: 5.8 g/dL — ABNORMAL LOW (ref 6.5–8.1)

## 2016-05-07 LAB — GLUCOSE, CAPILLARY
Glucose-Capillary: 84 mg/dL (ref 65–99)
Glucose-Capillary: 133 mg/dL — ABNORMAL HIGH (ref 65–99)
Glucose-Capillary: 123 mg/dL — ABNORMAL HIGH (ref 65–99)
Glucose-Capillary: 136 mg/dL — ABNORMAL HIGH (ref 65–99)

## 2016-05-07 MED ORDER — ONDANSETRON HCL 4 MG/2ML IJ SOLN
4.0000 mg | Freq: Four times a day (QID) | INTRAMUSCULAR | Status: AC
  Administered 2016-05-07: 12:00:00 4 mg via INTRAVENOUS
  Filled 2016-05-07: qty 2

## 2016-05-07 NOTE — Progress Notes (Signed)
Physical Therapy Treatment Patient Details Name: Alexis Tucker MRN: [id number] DOB: 1941-01-07 Today's Date: 05/07/2016    History of Present Illness presented to ER secondary to 2 day history of nausea/vomiting, progressive weakness and fall; admitted under observation for gastroenteritis, UTI.  Noted with elevated troponin, likely demand ischemia per cardiology.    PT Comments    Pt is walking today after being too nauseated last PT visit, very motivated and has great family/friend support in her room when PT arrived.  Her plan is to still go to SNF although pt hopes to avoid this.  Has been living alone and needs help for all mobility including standing and getting out of bed.  Will continue acutely to increase her strength and standing/gait tolerances to ease the transition home when ready, but to decrease SNF stay as well.  Follow Up Recommendations  SNF     Equipment Recommendations  Rolling walker with 5" wheels    Recommendations for Other Services       Precautions / Restrictions Precautions Precautions: Fall (telemetry ) Restrictions Weight Bearing Restrictions: No    Mobility  Bed Mobility Overal bed mobility: Needs Assistance Bed Mobility: Supine to Sit;Sit to Supine     Supine to sit: Min guard;Min assist Sit to supine: Min guard;Min assist   General bed mobility comments: uses bed rail and cued body mechanics  Transfers Overall transfer level: Needs assistance Equipment used: Rolling walker (2 wheeled);1 person hand held assist Transfers: Sit to/from [facility] Sit to Stand: Min assist Stand pivot transfers: Min assist;Min guard          Ambulation/Gait Ambulation/Gait assistance: Min guard;Min assist Ambulation Distance (Feet): 150 Feet Assistive device: Rolling walker (2 wheeled);1 person hand held assist Gait Pattern/deviations: Step-through pattern;Narrow base of support;Decreased stride length Gait velocity: reduced Gait  velocity interpretation: Below normal speed for age/gender General Gait Details: did well but has weakness proximally that makes her less steady   Science writer    Modified Rankin (Stroke Patients Only)       Balance Overall balance assessment: Needs assistance Sitting-balance support: Feet supported Sitting balance-Leahy Scale: Good   Postural control: Posterior lean Standing balance support: Bilateral upper extremity supported Standing balance-Leahy Scale: Fair                      Cognition Arousal/Alertness: Awake/alert Behavior During Therapy: WFL for tasks assessed/performed Overall Cognitive Status: Within Functional Limits for tasks assessed                      Exercises      General Comments General comments (skin integrity, edema, etc.): Pt is hoping to go directly home but is not safe to walk yet, clear signs of fatigue with gait      Pertinent Vitals/Pain Pain Assessment: No/denies pain    Home Living                      Prior Function            PT Goals (current goals can now be found in the care plan section) Acute Rehab PT Goals Patient Stated Goal: try to walk PT Goal Formulation: With patient Potential to Achieve Goals: Good Progress towards PT goals: Progressing toward goals    Frequency    Min 2X/week      PT Plan Current plan remains appropriate  Co-evaluation             End of Session Equipment Utilized During Treatment: Gait belt Activity Tolerance: Patient limited by fatigue Patient left: in bed;with call bell/phone within reach;with bed alarm set     Time: 2773-7505 PT Time Calculation (min) (ACUTE ONLY): 30 min  Charges:  $Gait Training: 8-22 mins $Therapeutic Activity: 8-22 mins                    G Codes:      Ramond Dial 11-May-2016, 4:26 PM    Mee Hives, PT MS Acute Rehab Dept. Number: [place]

## 2016-05-07 NOTE — Progress Notes (Signed)
Subjective: No CP  No SOB   Objective: Vitals:   05/06/16 1712 05/06/16 2028 05/07/16 0525 05/07/16 0800  BP: 124/62 (!) 139/54 (!) 133/48 (!) 145/58  Pulse: 91 76 74 73  Resp:  20 20   Temp: 98.5 F (36.9 C) 98.3 F (36.8 C) 98.6 F (37 C)   TempSrc: Oral Oral Oral   SpO2: 97% 96% 97%   Weight:      Height:       Weight change:   Intake/Output Summary (Last 24 hours) at 05/07/16 1039 Last data filed at 05/07/16 0800  Gross per 24 hour  Intake              892 ml  Output              200 ml  Net              692 ml    General: Alert, awake, oriented x3, in no acute distress Neck:  JVP is normal Heart: Regular rate and rhythm, without murmurs, rubs, gallops.  Lungs: Clear to auscultation.  No rales or wheezes. Exemities:  No edema.   Neuro: Grossly intact, nonfocal.  Tele  Currently SR   Short perior of afib noted    Lab Results: Results for orders placed or performed during the hospital encounter of 05/03/16 (from the past 24 hour(s))  Glucose, capillary     Status: Abnormal   Collection Time: 05/06/16 11:56 AM  Result Value Ref Range   Glucose-Capillary 113 (H) 65 - 99 mg/dL   Comment 1 Notify RN    Comment 2 Document in Chart   Glucose, capillary     Status: Abnormal   Collection Time: 05/06/16  5:13 PM  Result Value Ref Range   Glucose-Capillary 117 (H) 65 - 99 mg/dL   Comment 1 Notify RN    Comment 2 Document in Chart   Glucose, capillary     Status: Abnormal   Collection Time: 05/06/16 10:59 PM  Result Value Ref Range   Glucose-Capillary 111 (H) 65 - 99 mg/dL  Comprehensive metabolic panel     Status: Abnormal   Collection Time: 05/07/16  4:55 AM  Result Value Ref Range   Sodium 141 135 - 145 mmol/L   Potassium 3.7 3.5 - 5.1 mmol/L   Chloride 113 (H) 101 - 111 mmol/L   CO2 22 22 - 32 mmol/L   Glucose, Bld 93 65 - 99 mg/dL   BUN 31 (H) 6 - 20 mg/dL   Creatinine, Ser 3.94 (H) 0.44 - 1.00 mg/dL   Calcium 8.4 (L) 8.9 - 10.3 mg/dL   Total Protein  5.8 (L) 6.5 - 8.1 g/dL   Albumin 2.9 (L) 3.5 - 5.0 g/dL   AST 26 15 - 41 U/L   ALT 13 (L) 14 - 54 U/L   Alkaline Phosphatase 61 38 - 126 U/L   Total Bilirubin 0.6 0.3 - 1.2 mg/dL   GFR calc non Af Amer 10 (L) >60 mL/min   GFR calc Af Amer 12 (L) >60 mL/min   Anion gap 6 5 - 15  Glucose, capillary     Status: None   Collection Time: 05/07/16  7:41 AM  Result Value Ref Range   Glucose-Capillary 84 65 - 99 mg/dL    Studies/Results: Mr Abdomen Mrcp Wo Contrast  Result Date: 05/06/2016 CLINICAL DATA:  Nausea and vomiting for 2 days. Pancreatic ductal dilatation seen on recent CT. EXAM: MRI ABDOMEN WITHOUT CONTRAST  (  INCLUDING MRCP) TECHNIQUE: Multiplanar multisequence MR imaging of the abdomen was performed. Heavily T2-weighted images of the biliary and pancreatic ducts were obtained, and three-dimensional MRCP images were rendered by post processing. COMPARISON:  CT on 05/04/2016 FINDINGS: Image degradation due to motion artifact noted. Lower chest: No acute findings. Hepatobiliary: No mass visualized on this unenhanced exam. Prior cholecystectomy noted. Common bile duct measures 9 mm, which is within normal limits status post cholecystectomy. No evidence of choledocholithiasis. Pancreas: No mass or inflammatory process visualized on this unenhanced exam. No evidence pancreatic ductal dilatation. Spleen:  Within normal limits in size. Adrenals/Urinary tract: Unremarkable. No evidence of mass or hydronephrosis. Small cyst seen in anterior midpole of right kidney. Stomach/Bowel: Visualized intra-abdominal bowel loops are nondilated and unremarkable in appearance. Vascular/Lymphatic: No pathologically enlarged lymph nodes identified. No evidence of abdominal aortic aneurysm. Other:  None. Musculoskeletal:  No suspicious bone lesions identified. IMPRESSION: Image degradation due to motion artifact. No acute findings identified. Prior cholecystectomy. No evidence of biliary ductal dilatation or  choledocholithiasis. No evidence of pancreatic ductal dilatation. Electronically Signed   By: Earle Gell M.D.   On: 05/06/2016 20:29   Mr 3d Recon At Scanner  Result Date: 05/06/2016 CLINICAL DATA:  Nausea and vomiting for 2 days. Pancreatic ductal dilatation seen on recent CT. EXAM: MRI ABDOMEN WITHOUT CONTRAST  (INCLUDING MRCP) TECHNIQUE: Multiplanar multisequence MR imaging of the abdomen was performed. Heavily T2-weighted images of the biliary and pancreatic ducts were obtained, and three-dimensional MRCP images were rendered by post processing. COMPARISON:  CT on 05/04/2016 FINDINGS: Image degradation due to motion artifact noted. Lower chest: No acute findings. Hepatobiliary: No mass visualized on this unenhanced exam. Prior cholecystectomy noted. Common bile duct measures 9 mm, which is within normal limits status post cholecystectomy. No evidence of choledocholithiasis. Pancreas: No mass or inflammatory process visualized on this unenhanced exam. No evidence pancreatic ductal dilatation. Spleen:  Within normal limits in size. Adrenals/Urinary tract: Unremarkable. No evidence of mass or hydronephrosis. Small cyst seen in anterior midpole of right kidney. Stomach/Bowel: Visualized intra-abdominal bowel loops are nondilated and unremarkable in appearance. Vascular/Lymphatic: No pathologically enlarged lymph nodes identified. No evidence of abdominal aortic aneurysm. Other:  None. Musculoskeletal:  No suspicious bone lesions identified. IMPRESSION: Image degradation due to motion artifact. No acute findings identified. Prior cholecystectomy. No evidence of biliary ductal dilatation or choledocholithiasis. No evidence of pancreatic ductal dilatation. Electronically Signed   By: Earle Gell M.D.   On: 05/06/2016 20:29    Medications: Reviewed    @PROBHOSP @  1  Atrial fib  Defveloped post procedure    Back in SR  Would follow  I would not change meds for now  May be due to catechol surge around  procedure  She is not a candidate for anticoag given ulcers.     2  GI  EGD with multple duodenal ulcers No bleeding  Gastritis  Esophagitis    LOS: 0 days   Dorris Carnes 05/07/2016, 10:39 AM

## 2016-05-07 NOTE — Progress Notes (Signed)
[facility]  ROUNDING NOTE   Subjective:   Nausea has improved.   Objective:  Vital signs in last 24 hours:  Temp:  [97.5 F (36.4 C)-98.6 F (37 C)] 98.6 F (37 C) (11/05 0525) Pulse Rate:  [73-103] 73 (11/05 0800) Resp:  [20] 20 (11/05 0525) BP: (124-158)/(48-66) 145/58 (11/05 0800) SpO2:  [96 %-100 %] 97 % (11/05 0525) FiO2 (%):  [28 %] 28 % (11/04 1518)  Weight change:  Filed Weights   05/03/16 2333 05/05/16 0500 05/06/16 0500  Weight: 87.1 kg (192 lb) 87.4 kg (192 lb 11.2 oz) 90.7 kg (200 lb)    Intake/Output: I/O last 3 completed shifts: In: 1969 [P.O.:1000; I.V.:969] Out: 450 [Urine:450]   Intake/Output this shift:  Total I/O In: 480 [P.O.:480] Out: -   Physical Exam: General: NAD, laying in bed  Head: Normocephalic, atraumatic. Moist oral mucosal membranes  Eyes: Anicteric, PERRL  Neck: Supple, trachea midline  Lungs:  Clear to auscultation  Heart: Regular rate and rhythm  Abdomen:  Soft, nontender,   Extremities: no peripheral edema.  Neurologic: Nonfocal, moving all four extremities  Skin: No lesions  Access: none    Basic Metabolic Panel:  Recent Labs Lab 05/04/16 0010 05/05/16 0359 05/06/16 0536 05/07/16 0455  NA 140 139 141 141  K 4.8 4.1 3.8 3.7  CL 108 113* 116* 113*  CO2 20* 22 21* 22  GLUCOSE 177* 122* 90 93  BUN 43* 38* 33* 31*  CREATININE 4.15* 3.62* 3.87* 3.94*  CALCIUM 9.4 8.1* 8.4* 8.4*    Liver Function Tests:  Recent Labs Lab 05/04/16 0010 05/07/16 0455  AST 39 26  ALT 15 13*  ALKPHOS 86 61  BILITOT 0.8 0.6  PROT 7.8 5.8*  ALBUMIN 4.0 2.9*    Recent Labs Lab 05/05/16 0359  LIPASE 40   No results for input(s): AMMONIA in the last 168 hours.  CBC:  Recent Labs Lab 05/04/16 0010  WBC 10.4  NEUTROABS 8.0*  HGB 12.2  HCT 36.6  MCV 88.7  PLT 119*    Cardiac Enzymes:  Recent Labs Lab 05/04/16 0010 05/04/16 0522 05/04/16 1153 05/04/16 1648  TROPONINI 0.62* 0.60* 0.47* 0.37*     BNP: Invalid input(s): POCBNP  CBG:  Recent Labs Lab 05/06/16 1156 05/06/16 1713 05/06/16 2259 05/07/16 0741 05/07/16 1155  GLUCAP 113* 117* 111* 84 133*    Microbiology: No results found for this or any previous visit.  Coagulation Studies: No results for input(s): LABPROT, INR in the last 72 hours.  Urinalysis: No results for input(s): COLORURINE, LABSPEC, PHURINE, GLUCOSEU, HGBUR, BILIRUBINUR, KETONESUR, PROTEINUR, UROBILINOGEN, NITRITE, LEUKOCYTESUR in the last 72 hours.  Invalid input(s): APPERANCEUR    Imaging: Mr Abdomen Mrcp Wo Contrast  Result Date: 05/06/2016 CLINICAL DATA:  Nausea and vomiting for 2 days. Pancreatic ductal dilatation seen on recent CT. EXAM: MRI ABDOMEN WITHOUT CONTRAST  (INCLUDING MRCP) TECHNIQUE: Multiplanar multisequence MR imaging of the abdomen was performed. Heavily T2-weighted images of the biliary and pancreatic ducts were obtained, and three-dimensional MRCP images were rendered by post processing. COMPARISON:  CT on 05/04/2016 FINDINGS: Image degradation due to motion artifact noted. Lower chest: No acute findings. Hepatobiliary: No mass visualized on this unenhanced exam. Prior cholecystectomy noted. Common bile duct measures 9 mm, which is within normal limits status post cholecystectomy. No evidence of choledocholithiasis. Pancreas: No mass or inflammatory process visualized on this unenhanced exam. No evidence pancreatic ductal dilatation. Spleen:  Within normal limits in size. Adrenals/Urinary tract: Unremarkable. No  evidence of mass or hydronephrosis. Small cyst seen in anterior midpole of right kidney. Stomach/Bowel: Visualized intra-abdominal bowel loops are nondilated and unremarkable in appearance. Vascular/Lymphatic: No pathologically enlarged lymph nodes identified. No evidence of abdominal aortic aneurysm. Other:  None. Musculoskeletal:  No suspicious bone lesions identified. IMPRESSION: Image degradation due to motion artifact.  No acute findings identified. Prior cholecystectomy. No evidence of biliary ductal dilatation or choledocholithiasis. No evidence of pancreatic ductal dilatation. Electronically Signed   By: Earle Gell M.D.   On: 05/06/2016 20:29   Mr 3d Recon At Scanner  Result Date: 05/06/2016 CLINICAL DATA:  Nausea and vomiting for 2 days. Pancreatic ductal dilatation seen on recent CT. EXAM: MRI ABDOMEN WITHOUT CONTRAST  (INCLUDING MRCP) TECHNIQUE: Multiplanar multisequence MR imaging of the abdomen was performed. Heavily T2-weighted images of the biliary and pancreatic ducts were obtained, and three-dimensional MRCP images were rendered by post processing. COMPARISON:  CT on 05/04/2016 FINDINGS: Image degradation due to motion artifact noted. Lower chest: No acute findings. Hepatobiliary: No mass visualized on this unenhanced exam. Prior cholecystectomy noted. Common bile duct measures 9 mm, which is within normal limits status post cholecystectomy. No evidence of choledocholithiasis. Pancreas: No mass or inflammatory process visualized on this unenhanced exam. No evidence pancreatic ductal dilatation. Spleen:  Within normal limits in size. Adrenals/Urinary tract: Unremarkable. No evidence of mass or hydronephrosis. Small cyst seen in anterior midpole of right kidney. Stomach/Bowel: Visualized intra-abdominal bowel loops are nondilated and unremarkable in appearance. Vascular/Lymphatic: No pathologically enlarged lymph nodes identified. No evidence of abdominal aortic aneurysm. Other:  None. Musculoskeletal:  No suspicious bone lesions identified. IMPRESSION: Image degradation due to motion artifact. No acute findings identified. Prior cholecystectomy. No evidence of biliary ductal dilatation or choledocholithiasis. No evidence of pancreatic ductal dilatation. Electronically Signed   By: Earle Gell M.D.   On: 05/06/2016 20:29     Medications:   . sodium chloride 50 mL/hr at 05/07/16 0801   . allopurinol  100 mg  Oral Daily  . anastrozole  1 mg Oral Daily  . aspirin EC  81 mg Oral Daily  . carvedilol  25 mg Oral Daily  . cholecalciferol  5,000 Units Oral Daily  . docusate sodium  100 mg Oral BID  . ferrous gluconate  324 mg Oral Q breakfast  . heparin  5,000 Units Subcutaneous Q8H  . hydrALAZINE  50 mg Oral Q8H  . insulin aspart  0-5 Units Subcutaneous QHS  . insulin aspart  0-9 Units Subcutaneous TID WC  . isosorbide dinitrate  20 mg Oral BID  . latanoprost  1 drop Both Eyes QHS  . linagliptin  5 mg Oral Daily  . multivitamin  1 tablet Oral Q breakfast  . pantoprazole  40 mg Oral BID  . pentoxifylline  400 mg Oral Daily  . senna  2 tablet Oral Daily  . thyroid  60 mg Oral QAC breakfast   acetaminophen **OR** acetaminophen, fentaNYL (SUBLIMAZE) injection, meclizine, ondansetron (ZOFRAN) IV, prochlorperazine, promethazine  Assessment/ Plan:  Ms. Micalah Cabezas is a 75 y.o. black female with long-standing diabetes for over 15 years with complications of retinopathy, history of colon cancer in 2005, breast cancer in 2008, history of severe arthritis and nonsteroidal use  1. 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  - Family is agreeable to follow up outpatient to start preparations for dialysis.    2. Hypertension:  blood pressure is at goal.  - carvedilol and hydralazine.   3. Diabetes mellitus type II with chronic kidney disease: hemoglobin A1c at goal     LOS: 0 Collie Wernick, [place] 11/5/201712:11 PM

## 2016-05-07 NOTE — Consult Note (Signed)
Patient with feeling much better, no vomiting, no abd pain.  Labs stable, creatinine 3.9, alb 2.9 bun31,  MRCP negative for pancreatic disease. Some motion artifact esp R leg.  Tol clear liq well, may advance to full liquid, home soon.

## 2016-05-07 NOTE — Progress Notes (Signed)
[facility] [place] NAME: Alexis Tucker    MR#:  [id number]  DATE OF BIRTH:  1940/07/27  SUBJECTIVE:  CHIEF COMPLAINT:  No chief complaint on file.  -Patient feels much better today. No nausea or dizziness. Dialysis can be discussed as outpatient as kidney function has remained stable in the past year. -EGD showing multiple duodenal ulcers  REVIEW OF SYSTEMS:  Review of Systems  Constitutional: Negative for chills, fever and malaise/fatigue.  HENT: Negative for ear discharge, ear pain, nosebleeds and tinnitus.   Eyes: Negative for blurred vision and double vision.  Respiratory: Negative for cough, shortness of breath and wheezing.   Cardiovascular: Negative for chest pain, palpitations and leg swelling.  Gastrointestinal: Negative for abdominal pain, constipation, diarrhea, nausea and vomiting.  Genitourinary: Negative for dysuria and urgency.  Musculoskeletal: Negative for myalgias and neck pain.  Neurological: Positive for weakness. Negative for dizziness, speech change, focal weakness, seizures and headaches.  Psychiatric/Behavioral: Negative for depression.    DRUG ALLERGIES:   Allergies  Allergen Reactions  . Codeine   . Tramadol Other (See Comments)    nightmares  . Sulfa Antibiotics Itching and Rash    VITALS:  Blood pressure (!) 145/58, pulse 73, temperature 98.6 F (37 C), temperature source Oral, resp. rate 20, height 5\' 4"  (1.626 m), weight 90.7 kg (200 lb), SpO2 97 %.  PHYSICAL EXAMINATION:  Physical Exam  GENERAL:  75 y.o.-year-old patient lying in the bed with no acute distress.  EYES: Pupils equal, round, reactive to light and accommodation. No scleral icterus. Extraocular muscles intact.  HEENT: Head atraumatic, normocephalic. Oropharynx and nasopharynx clear.  NECK:  Supple, no jugular venous distention. No thyroid enlargement, no tenderness.  LUNGS: Normal breath sounds bilaterally, no wheezing,  rales,rhonchi or crepitation. No use of accessory muscles of respiration.  CARDIOVASCULAR: S1, S2 normal. No murmurs, rubs, or gallops.  ABDOMEN: Soft, nontender, nondistended. Bowel sounds present. No organomegaly or mass.  EXTREMITIES: No pedal edema, cyanosis, or clubbing.  NEUROLOGIC: Cranial nerves II through XII are intact. Muscle strength 5/5 in all extremities. Sensation intact. Gait not checked. No nystagmus. Global weakness noted. PSYCHIATRIC: The patient is alert and oriented x 3.  SKIN: No obvious rash, lesion, or ulcer.    LABORATORY PANEL:   CBC  Recent Labs Lab 05/04/16 0010  WBC 10.4  HGB 12.2  HCT 36.6  PLT 119*   ------------------------------------------------------------------------------------------------------------------  Chemistries   Recent Labs Lab 05/07/16 0455  NA 141  K 3.7  CL 113*  CO2 22  GLUCOSE 93  BUN 31*  CREATININE 3.94*  CALCIUM 8.4*  AST 26  ALT 13*  ALKPHOS 61  BILITOT 0.6   ------------------------------------------------------------------------------------------------------------------  Cardiac Enzymes  Recent Labs Lab 05/04/16 1648  TROPONINI 0.37*   ------------------------------------------------------------------------------------------------------------------  RADIOLOGY:  Mr Abdomen Mrcp Wo Contrast  Result Date: 05/06/2016 CLINICAL DATA:  Nausea and vomiting for 2 days. Pancreatic ductal dilatation seen on recent CT. EXAM: MRI ABDOMEN WITHOUT CONTRAST  (INCLUDING MRCP) TECHNIQUE: Multiplanar multisequence MR imaging of the abdomen was performed. Heavily T2-weighted images of the biliary and pancreatic ducts were obtained, and three-dimensional MRCP images were rendered by post processing. COMPARISON:  CT on 05/04/2016 FINDINGS: Image degradation due to motion artifact noted. Lower chest: No acute findings. Hepatobiliary: No mass visualized on this unenhanced exam. Prior cholecystectomy noted. Common bile duct  measures 9 mm, which is within normal limits status post cholecystectomy. No evidence of choledocholithiasis. Pancreas: No mass or inflammatory  process visualized on this unenhanced exam. No evidence pancreatic ductal dilatation. Spleen:  Within normal limits in size. Adrenals/Urinary tract: Unremarkable. No evidence of mass or hydronephrosis. Small cyst seen in anterior midpole of right kidney. Stomach/Bowel: Visualized intra-abdominal bowel loops are nondilated and unremarkable in appearance. Vascular/Lymphatic: No pathologically enlarged lymph nodes identified. No evidence of abdominal aortic aneurysm. Other:  None. Musculoskeletal:  No suspicious bone lesions identified. IMPRESSION: Image degradation due to motion artifact. No acute findings identified. Prior cholecystectomy. No evidence of biliary ductal dilatation or choledocholithiasis. No evidence of pancreatic ductal dilatation. Electronically Signed   By: Earle Gell M.D.   On: 05/06/2016 20:29   Mr 3d Recon At Scanner  Result Date: 05/06/2016 CLINICAL DATA:  Nausea and vomiting for 2 days. Pancreatic ductal dilatation seen on recent CT. EXAM: MRI ABDOMEN WITHOUT CONTRAST  (INCLUDING MRCP) TECHNIQUE: Multiplanar multisequence MR imaging of the abdomen was performed. Heavily T2-weighted images of the biliary and pancreatic ducts were obtained, and three-dimensional MRCP images were rendered by post processing. COMPARISON:  CT on 05/04/2016 FINDINGS: Image degradation due to motion artifact noted. Lower chest: No acute findings. Hepatobiliary: No mass visualized on this unenhanced exam. Prior cholecystectomy noted. Common bile duct measures 9 mm, which is within normal limits status post cholecystectomy. No evidence of choledocholithiasis. Pancreas: No mass or inflammatory process visualized on this unenhanced exam. No evidence pancreatic ductal dilatation. Spleen:  Within normal limits in size. Adrenals/Urinary tract: Unremarkable. No evidence of mass  or hydronephrosis. Small cyst seen in anterior midpole of right kidney. Stomach/Bowel: Visualized intra-abdominal bowel loops are nondilated and unremarkable in appearance. Vascular/Lymphatic: No pathologically enlarged lymph nodes identified. No evidence of abdominal aortic aneurysm. Other:  None. Musculoskeletal:  No suspicious bone lesions identified. IMPRESSION: Image degradation due to motion artifact. No acute findings identified. Prior cholecystectomy. No evidence of biliary ductal dilatation or choledocholithiasis. No evidence of pancreatic ductal dilatation. Electronically Signed   By: Earle Gell M.D.   On: 05/06/2016 20:29    EKG:   Orders placed or performed during the hospital encounter of 05/03/16  . ED EKG  . ED EKG  . EKG 12-Lead  . EKG 12-Lead  . EKG 12-Lead  . EKG 12-Lead    ASSESSMENT AND PLAN:   75 year old female with past medical history significant for CK D stage IV, diabetes, diabetic retinopathy, GERD, hypertension, anemia him a hypertension, neuropathy presents from home secondary to worsening nausea and dizziness. Also complained of chest pain on admission.  #1 nausea and dizziness-gastroenteritis Possibly -gentle hydration. Symptomatically treatment for nausea and dizziness. Improved symptoms -Advance diet. GI consulted. CT of the abdomen with dilated pancreatic duct, MRCP with no significant obstruction or dilatation of the ducts -EGD showing multiple duodenal ulcers. Started on PPI -Appreciate GI consult -negative for orthostatic blood pressure changes  #2 Elevated troponin- likely demand ischemia and poor renal clearance - no chest pain now,  - plateaued troponins, ECHO with normal EF, no wall motion abnormalities and mild diastolic dysfunction noted and appreciate cards consult - asa, coreg, imdur, added statin - Outpatient stress test recommended -Patient went into A. fib after the procedure. Cardiology to follow up. Not a candidate for any  anticoagulation due to her recent duodenal ulcers. Continue low-dose aspirin at this time  #3 DM- linagliptin, SSI -Sugars have been borderline low due to her poor intake  #4 history of breast cancer-on Arimidex  #5 CKD stage 5- Creatinine is close to baseline and GFR remains less than 15. -Follows with  Dr. Candiss Norse as an outpatient. CK D stage V, GFR remains stable. Outpatient follow-up for dialysis initiation according to nephrology.  #6 DVT Prophylaxis- heparin SQ  Physical Therapy consulted. Recommended SNF. Possible discharge tomorrow Discussed with daughter at bedside   All the records are reviewed and case discussed with Care Management/Social Workerr. Management plans discussed with the patient, family and they are in agreement.  CODE STATUS: Full code  TOTAL TIME TAKING CARE OF THIS PATIENT: 36 minutes.   POSSIBLE D/C TOMORROW, DEPENDING ON CLINICAL CONDITION.   Gladstone Lighter M.D on 05/07/2016 at 11:56 AM  Between 7am to 6pm - Pager - [phone]  After 6pm go to [email or web address] - password [email or web address]  Office  [phone]  CC: Primary care physician; Ria Bush, MD

## 2016-05-07 NOTE — Clinical Social Work Note (Signed)
CSW received consult for possible SNF placement. CSW will follow pending PT/OT recommendations.   Santiago Bumpers, MSW, LCSW-A [phone]

## 2016-05-08 ENCOUNTER — Encounter: Payer: Self-pay | Admitting: Unknown Physician Specialty

## 2016-05-08 ENCOUNTER — Telehealth: Payer: Self-pay

## 2016-05-08 DIAGNOSIS — E1122 Type 2 diabetes mellitus with diabetic chronic kidney disease: Secondary | ICD-10-CM | POA: Diagnosis not present

## 2016-05-08 DIAGNOSIS — R809 Proteinuria, unspecified: Secondary | ICD-10-CM | POA: Diagnosis not present

## 2016-05-08 DIAGNOSIS — N185 Chronic kidney disease, stage 5: Secondary | ICD-10-CM | POA: Diagnosis not present

## 2016-05-08 DIAGNOSIS — I10 Essential (primary) hypertension: Secondary | ICD-10-CM | POA: Diagnosis not present

## 2016-05-08 DIAGNOSIS — K529 Noninfective gastroenteritis and colitis, unspecified: Secondary | ICD-10-CM | POA: Diagnosis not present

## 2016-05-08 LAB — GLUCOSE, CAPILLARY
Glucose-Capillary: 104 mg/dL — ABNORMAL HIGH (ref 65–99)
Glucose-Capillary: 197 mg/dL — ABNORMAL HIGH (ref 65–99)
Glucose-Capillary: 157 mg/dL — ABNORMAL HIGH (ref 65–99)

## 2016-05-08 MED ORDER — ASPIRIN 81 MG PO TBEC: 81.0000 mg | DELAYED_RELEASE_TABLET | Freq: Every day | ORAL | 12 refills | Status: DC

## 2016-05-08 MED ORDER — PROMETHAZINE HCL 25 MG PO TABS: 25.0000 mg | ORAL_TABLET | Freq: Four times a day (QID) | ORAL | 0 refills | Status: DC | PRN

## 2016-05-08 MED ORDER — PANTOPRAZOLE SODIUM 40 MG PO TBEC: 40.0000 mg | DELAYED_RELEASE_TABLET | Freq: Two times a day (BID) | ORAL | 2 refills | Status: DC

## 2016-05-08 MED ORDER — ACETAMINOPHEN 325 MG PO TABS: 650.0000 mg | ORAL_TABLET | Freq: Four times a day (QID) | ORAL | 0 refills | Status: DC | PRN

## 2016-05-08 MED ORDER — HYDRALAZINE HCL 50 MG PO TABS
50.0000 mg | ORAL_TABLET | Freq: Three times a day (TID) | ORAL | 2 refills | Status: DC
Start: 2016-05-08 — End: 2017-08-17

## 2016-05-08 NOTE — Care Management (Signed)
Admitted to [facility] with the diagnosis of nausea/vomitting. Lives alone. Daughter is Santiago Glad [phone]). Last seen Dr. Danise Mina last May. No home health. No skilled facility. No home oxygen. Takes care of all basic activities of daily living herself, doesn't drive. Slid out of bed prior to admission. Good appetite. Uses no aids for ambulation. Prescriptions are filled at [facility]. Daughter will transport. Physical therapy evaluation completed. Recommending skilled nursing. Ms. Galindez is Medicare and observation status.  Discussed agencies. [facility] for services in the home and rolling walker. Floydene Flock, Advanced representative updated. Shelbie Ammons RN MSN Care Management [phone]

## 2016-05-08 NOTE — Progress Notes (Signed)
[facility]  ROUNDING NOTE   Subjective:  Patient resting comfortably in bed this a.m. No new renal function testing today. Patient is to followup in our office for underlying advanced renal dysfunction. She still has not made up her mind as to whether she wants to perform renal replacement therapy in the future.   Objective:  Vital signs in last 24 hours:  Temp:  [98 F (36.7 C)-99.6 F (37.6 C)] 99.6 F (37.6 C) (11/06 0530) Pulse Rate:  [78-82] 79 (11/06 1353) Resp:  [19-22] 19 (11/06 0530) BP: (132-150)/(38-49) 132/45 (11/06 1353) SpO2:  [95 %-100 %] 95 % (11/06 0530) Weight:  [99.2 kg (218 lb 9.6 oz)] 99.2 kg (218 lb 9.6 oz) (11/06 0500)  Weight change:  Filed Weights   05/05/16 0500 05/06/16 0500 05/08/16 0500  Weight: 87.4 kg (192 lb 11.2 oz) 90.7 kg (200 lb) 99.2 kg (218 lb 9.6 oz)    Intake/Output: I/O last 3 completed shifts: In: 1720 [P.O.:1720] Out: 200 [Urine:200]   Intake/Output this shift:  Total I/O In: 760 [P.O.:760] Out: -   Physical Exam: General: NAD, laying in bed  Head: Normocephalic, atraumatic. Moist oral mucosal membranes  Eyes: Anicteric  Neck: Supple, trachea midline  Lungs:  Clear to auscultation  Heart: Regular rate and rhythm  Abdomen:  Soft, nontender  Extremities: no peripheral edema.  Neurologic: Nonfocal, moving all four extremities  Skin: No lesions  Access: none    Basic Metabolic Panel:  Recent Labs Lab 05/04/16 0010 05/05/16 0359 05/06/16 0536 05/07/16 0455  NA 140 139 141 141  K 4.8 4.1 3.8 3.7  CL 108 113* 116* 113*  CO2 20* 22 21* 22  GLUCOSE 177* 122* 90 93  BUN 43* 38* 33* 31*  CREATININE 4.15* 3.62* 3.87* 3.94*  CALCIUM 9.4 8.1* 8.4* 8.4*    Liver Function Tests:  Recent Labs Lab 05/04/16 0010 05/07/16 0455  AST 39 26  ALT 15 13*  ALKPHOS 86 61  BILITOT 0.8 0.6  PROT 7.8 5.8*  ALBUMIN 4.0 2.9*    Recent Labs Lab 05/05/16 0359  LIPASE 40   No results for input(s):  AMMONIA in the last 168 hours.  CBC:  Recent Labs Lab 05/04/16 0010  WBC 10.4  NEUTROABS 8.0*  HGB 12.2  HCT 36.6  MCV 88.7  PLT 119*    Cardiac Enzymes:  Recent Labs Lab 05/04/16 0010 05/04/16 0522 05/04/16 1153 05/04/16 1648  TROPONINI 0.62* 0.60* 0.47* 0.37*    BNP: Invalid input(s): POCBNP  CBG:  Recent Labs Lab 05/07/16 1656 05/07/16 2113 05/08/16 0800 05/08/16 1126 05/08/16 1340  GLUCAP 123* 136* 104* 197* 157*    Microbiology: No results found for this or any previous visit.  Coagulation Studies: No results for input(s): LABPROT, INR in the last 72 hours.  Urinalysis: No results for input(s): COLORURINE, LABSPEC, PHURINE, GLUCOSEU, HGBUR, BILIRUBINUR, KETONESUR, PROTEINUR, UROBILINOGEN, NITRITE, LEUKOCYTESUR in the last 72 hours.  Invalid input(s): APPERANCEUR    Imaging: Mr Abdomen Mrcp Wo Contrast  Result Date: 05/06/2016 CLINICAL DATA:  Nausea and vomiting for 2 days. Pancreatic ductal dilatation seen on recent CT. EXAM: MRI ABDOMEN WITHOUT CONTRAST  (INCLUDING MRCP) TECHNIQUE: Multiplanar multisequence MR imaging of the abdomen was performed. Heavily T2-weighted images of the biliary and pancreatic ducts were obtained, and three-dimensional MRCP images were rendered by post processing. COMPARISON:  CT on 05/04/2016 FINDINGS: Image degradation due to motion artifact noted. Lower chest: No acute findings. Hepatobiliary: No mass visualized on this unenhanced exam.  Prior cholecystectomy noted. Common bile duct measures 9 mm, which is within normal limits status post cholecystectomy. No evidence of choledocholithiasis. Pancreas: No mass or inflammatory process visualized on this unenhanced exam. No evidence pancreatic ductal dilatation. Spleen:  Within normal limits in size. Adrenals/Urinary tract: Unremarkable. No evidence of mass or hydronephrosis. Small cyst seen in anterior midpole of right kidney. Stomach/Bowel: Visualized intra-abdominal bowel loops  are nondilated and unremarkable in appearance. Vascular/Lymphatic: No pathologically enlarged lymph nodes identified. No evidence of abdominal aortic aneurysm. Other:  None. Musculoskeletal:  No suspicious bone lesions identified. IMPRESSION: Image degradation due to motion artifact. No acute findings identified. Prior cholecystectomy. No evidence of biliary ductal dilatation or choledocholithiasis. No evidence of pancreatic ductal dilatation. Electronically Signed   By: Earle Gell M.D.   On: 05/06/2016 20:29   Mr 3d Recon At Scanner  Result Date: 05/06/2016 CLINICAL DATA:  Nausea and vomiting for 2 days. Pancreatic ductal dilatation seen on recent CT. EXAM: MRI ABDOMEN WITHOUT CONTRAST  (INCLUDING MRCP) TECHNIQUE: Multiplanar multisequence MR imaging of the abdomen was performed. Heavily T2-weighted images of the biliary and pancreatic ducts were obtained, and three-dimensional MRCP images were rendered by post processing. COMPARISON:  CT on 05/04/2016 FINDINGS: Image degradation due to motion artifact noted. Lower chest: No acute findings. Hepatobiliary: No mass visualized on this unenhanced exam. Prior cholecystectomy noted. Common bile duct measures 9 mm, which is within normal limits status post cholecystectomy. No evidence of choledocholithiasis. Pancreas: No mass or inflammatory process visualized on this unenhanced exam. No evidence pancreatic ductal dilatation. Spleen:  Within normal limits in size. Adrenals/Urinary tract: Unremarkable. No evidence of mass or hydronephrosis. Small cyst seen in anterior midpole of right kidney. Stomach/Bowel: Visualized intra-abdominal bowel loops are nondilated and unremarkable in appearance. Vascular/Lymphatic: No pathologically enlarged lymph nodes identified. No evidence of abdominal aortic aneurysm. Other:  None. Musculoskeletal:  No suspicious bone lesions identified. IMPRESSION: Image degradation due to motion artifact. No acute findings identified. Prior  cholecystectomy. No evidence of biliary ductal dilatation or choledocholithiasis. No evidence of pancreatic ductal dilatation. Electronically Signed   By: Earle Gell M.D.   On: 05/06/2016 20:29     Medications:    . allopurinol  100 mg Oral Daily  . anastrozole  1 mg Oral Daily  . aspirin EC  81 mg Oral Daily  . carvedilol  25 mg Oral Daily  . cholecalciferol  5,000 Units Oral Daily  . docusate sodium  100 mg Oral BID  . ferrous gluconate  324 mg Oral Q breakfast  . heparin  5,000 Units Subcutaneous Q8H  . hydrALAZINE  50 mg Oral Q8H  . insulin aspart  0-5 Units Subcutaneous QHS  . insulin aspart  0-9 Units Subcutaneous TID WC  . isosorbide dinitrate  20 mg Oral BID  . latanoprost  1 drop Both Eyes QHS  . linagliptin  5 mg Oral Daily  . multivitamin  1 tablet Oral Q breakfast  . pantoprazole  40 mg Oral BID  . pentoxifylline  400 mg Oral Daily  . senna  2 tablet Oral Daily  . thyroid  60 mg Oral QAC breakfast   acetaminophen **OR** acetaminophen, fentaNYL (SUBLIMAZE) injection, meclizine, prochlorperazine, promethazine  Assessment/ Plan:  [name] is a 75 y.o. black female with long-standing diabetes for over 15 years with complications of retinopathy, history of colon cancer in 2005, breast cancer in 2008, history of severe arthritis and nonsteroidal use  1. 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  - no urgent indication to start dialysis at the moment.  However she continues to have advanced renal dysfunction and will need close followup for her underlying chronic kidney disease.  She will followup with Dr. Candiss Norse in the next week or 2.   2. Hypertension: blood pressure currently 132/45.  Patient will be maintained on carvedilol as well as hydralazine.  3. Diabetes mellitus type II with chronic kidney disease: hemoglobin  A1c at goal     LOS: 0 Alexis Tucker 11/6/20172:44 PM

## 2016-05-08 NOTE — Progress Notes (Signed)
Daughter will arrive to provide transportation home at approximately 1400. Madlyn Frankel, RN

## 2016-05-08 NOTE — Progress Notes (Signed)
LCSW spoke with patient's daughter at request of patient. Alexis Tucker called via phone and updated on care plan with possible discharge to home vs SNF today. Updated regarding status being in observation and patient walking 148ft with no special care needs (such as IV antibiotics, wounds).  Daughter confirms DC home with home health. Reports patient lives alone, but they live in the same community with daughter being 1 minute away. Daughter reports she feels patient would want to go home and do Home health has she had done this in the past (2001 after a knee replacement and she went home with [facility] when she lived in [place])  [place] updated MD and CM regarding DC plans.  Will sign off at this time.    Plan:  Home with Home Health.  Lane Hacker, MSW Clinical Social Work: [employer] Coverage for :  [phone]

## 2016-05-08 NOTE — Discharge Summary (Signed)
[facility] [place] NAME: Alexis Tucker    MR#:  [id number]  DATE OF BIRTH:  1940-07-23  DATE OF ADMISSION:  05/03/2016   ADMITTING PHYSICIAN: Harrie Foreman, MD  DATE OF DISCHARGE: 05/08/16  PRIMARY CARE PHYSICIAN: Ria Bush, MD   ADMISSION DIAGNOSIS:   Weakness [R53.1] Elevated troponin [R74.8] Stage 5 chronic kidney disease not on chronic dialysis ([place]) [N18.5]  DISCHARGE DIAGNOSIS:   Principal Problem:   Nausea and vomiting Active Problems:   Type 2 diabetes mellitus, uncontrolled, with renal complications (HCC)   Chronic kidney disease (CKD), stage V (HCC)   Elevated troponin   Accelerated hypertension   PAF (paroxysmal atrial fibrillation) ([place])   SECONDARY DIAGNOSIS:   Past Medical History:  Diagnosis Date  . Anemia    per prior pcp  . Carotid stenosis, asymptomatic 01/2014   mild ICA, severe in ECA, rec rpt [place] 2 years  . Cataracts, bilateral   . CKD (chronic kidney disease) stage 4, GFR 15-29 ml/min (HCC) 01/2012   Cr 3.85, in 3s since 2010, discussing HD and L forearm graft ([place])  . Diabetes type 2, controlled ([place]) 2000s  . Diabetic retinopathy with macular edema ([place]) 03/2014   [place] at [facility] eye on avastin  . DJD (degenerative joint disease)    per prior pcp  . GERD (gastroesophageal reflux disease)   . Glaucoma 03/2014  . History of breast cancer 2008   infiltrating ductal carcinoma s/p mammosite  . History of colon cancer 2006   s/p colectomy  . History of hepatitis A 1990s   from food, resolved  . HTN (hypertension)   . Macular degeneration    wet R with vision loss, dry L; to see retina specialist  . Neuropathy ([place])    per prior pcp  . OA (osteoarthritis)    knees and hips  . Osteonecrosis (HCC)    hips?  . Post-surgical hypothyroidism     HOSPITAL COURSE:   75 year old female with past medical history significant for CK D stage IV, diabetes, diabetic retinopathy,  GERD, hypertension, anemia him a hypertension, neuropathy presents from home secondary to worsening nausea and dizziness.   #1 nausea and dizziness-gastroenteritis -gentle hydration. Symptomatically treatment for nausea and dizziness. Improved symptoms -Advanced diet. GI consulted. CT of the abdomen with dilated pancreatic duct, MRCP with no significant obstruction or dilatation of the ducts -EGD showing multiple duodenal ulcers. Started on PPI -Appreciate GI consult -negative for orthostatic blood pressure changes  #2 Elevated troponin- likely demand ischemia and poor renal clearance - no chest pain now,  - plateaued troponins, ECHO with normal EF, no wall motion abnormalities and mild diastolic dysfunction noted and appreciate cards consult - asa, coreg, imdur, added statin - Outpatient stress test recommended -Patient went into A. fib after the procedure. Cardiology to follow up. Not a candidate for any anticoagulation due to her recent duodenal ulcers. Continue low-dose aspirin at this time  #3 DM- linagliptin. Stop glimiperide at discharge -Sugars have been borderline low due to her poor intake  #4 history of breast cancer-on Arimidex  #5 CKD stage 5- Creatinine is close to baseline and GFR remains less than 15. -Follows with Dr. Candiss Norse as an outpatient. CK D stage V, GFR remains stable. Outpatient follow-up for dialysis initiation according to nephrology.   Physical Therapy consulted. discharge home today with home health.  DISCHARGE CONDITIONS:   Guarded  CONSULTS OBTAINED:   Treatment Team:  Wellington Hampshire,  MD Manya Silvas, MD Lavonia Dana, MD  DRUG ALLERGIES:   Allergies  Allergen Reactions  . Codeine   . Tramadol Other (See Comments)    nightmares  . Sulfa Antibiotics Itching and Rash   DISCHARGE MEDICATIONS:     Medication List    STOP taking these medications   glimepiride 1 MG tablet Commonly known as:  AMARYL     TAKE these  medications   acetaminophen 325 MG tablet Commonly known as:  TYLENOL Take 2 tablets (650 mg total) by mouth every 6 (six) hours as needed for mild pain (or Fever >/= 101). What changed:  medication strength  how much to take  when to take this  reasons to take this   [place] w/Device Kit Use as directed to check sugars daily 250.42   allopurinol 100 MG tablet Commonly known as:  ZYLOPRIM TAKE 1 TABLET BY MOUTH DAILY   anastrozole 1 MG tablet Commonly known as:  ARIMIDEX TAKE 1 TABLET BY MOUTH DAILY   ARMOUR THYROID 60 MG tablet Generic drug:  thyroid TAKE 1 TABLET BY MOUTH DAILY -NEED PHYSICAL   aspirin 81 MG EC tablet Take 1 tablet (81 mg total) by mouth daily. Start taking on:  05/09/2016   CALCIUM MAGNESIUM PO Take 1 tablet by mouth daily.   carvedilol 25 MG tablet Commonly known as:  COREG TAKE 1 TABLET BY MOUTH DAILY   FERROUS GLUCONATE PO Take 28 mg by mouth daily.   Folic Acid-Vit D6-UYQ I34 2.5-25-1 MG Tabs tablet Commonly known as:  FOLBEE Take 1 tablet by mouth daily.   hydrALAZINE 50 MG tablet Commonly known as:  APRESOLINE Take 1 tablet (50 mg total) by mouth 3 (three) times daily. What changed:  when to take this   isosorbide dinitrate 20 MG tablet Commonly known as:  ISORDIL TAKE 1 TABLET BY MOUTH DAILY   latanoprost 0.005 % ophthalmic solution Commonly known as:  XALATAN Place 1 drop into both eyes at bedtime.   ONGLYZA 5 MG Tabs tablet Generic drug:  saxagliptin HCl TAKE 1 TABLET BY MOUTH DAILY   pantoprazole 40 MG tablet Commonly known as:  PROTONIX Take 1 tablet (40 mg total) by mouth 2 (two) times daily.   pentoxifylline 400 MG CR tablet Commonly known as:  TRENTAL TAKE 1 TABLET BY MOUTH DAILY   promethazine 25 MG tablet Commonly known as:  PHENERGAN Take 1 tablet (25 mg total) by mouth every 6 (six) hours as needed for nausea or vomiting.   Vitamin D-3 5000 UNITS Tabs Take 1 tablet by mouth daily.          DISCHARGE INSTRUCTIONS:   1. Nephrology follow-up in 3 weeks 2. GI follow up in 4 weeks 3. PCP follow-up in 1-2 weeks 4. Cardiology follow up in 2-3 weeks  DIET:   Cardiac diet  ACTIVITY:   Activity as tolerated  OXYGEN:   Home Oxygen: No.  Oxygen Delivery: room air  DISCHARGE LOCATION:   home   If you experience worsening of your admission symptoms, develop shortness of breath, life threatening emergency, suicidal or homicidal thoughts you must seek medical attention immediately by calling 911 or calling your MD immediately  if symptoms less severe.  You Must read complete instructions/literature along with all the possible adverse reactions/side effects for all the Medicines you take and that have been prescribed to you. Take any new Medicines after you have completely understood and accpet all the possible adverse reactions/side effects.  Please note  You were cared for by a hospitalist during your hospital stay. If you have any questions about your discharge medications or the care you received while you were in the hospital after you are discharged, you can call the unit and asked to speak with the hospitalist on call if the hospitalist that took care of you is not available. Once you are discharged, your primary care physician will handle any further medical issues. Please note that NO REFILLS for any discharge medications will be authorized once you are discharged, as it is imperative that you return to your primary care physician (or establish a relationship with a primary care physician if you do not have one) for your aftercare needs so that they can reassess your need for medications and monitor your lab values.    On the day of Discharge:  VITAL SIGNS:   Blood pressure (!) 145/49, pulse 78, temperature 99.6 F (37.6 C), temperature source Oral, resp. rate 19, height '5\' 4"'$  (1.626 m), weight 99.2 kg (218 lb 9.6 oz), SpO2 95 %.  PHYSICAL EXAMINATION:    GENERAL:  75 y.o.-year-old patient lying in the bed with no acute distress.  EYES: Pupils equal, round, reactive to light and accommodation. No scleral icterus. Extraocular muscles intact.  HEENT: Head atraumatic, normocephalic. Oropharynx and nasopharynx clear.  NECK:  Supple, no jugular venous distention. No thyroid enlargement, no tenderness.  LUNGS: Normal breath sounds bilaterally, no wheezing, rales,rhonchi or crepitation. No use of accessory muscles of respiration.  CARDIOVASCULAR: S1, S2 normal. No murmurs, rubs, or gallops.  ABDOMEN: Soft, nontender, nondistended. Bowel sounds present. No organomegaly or mass.  EXTREMITIES: No pedal edema, cyanosis, or clubbing.  NEUROLOGIC: Cranial nerves II through XII are intact. Muscle strength 5/5 in all extremities. Sensation intact. Gait not checked. No nystagmus. Global weakness noted. PSYCHIATRIC: The patient is alert and oriented x 3.  SKIN: No obvious rash, lesion, or ulcer.   DATA REVIEW:   CBC  Recent Labs Lab 05/04/16 0010  WBC 10.4  HGB 12.2  HCT 36.6  PLT 119*    Chemistries   Recent Labs Lab 05/07/16 0455  NA 141  K 3.7  CL 113*  CO2 22  GLUCOSE 93  BUN 31*  CREATININE 3.94*  CALCIUM 8.4*  AST 26  ALT 13*  ALKPHOS 61  BILITOT 0.6     Microbiology Results  No results found for this or any previous visit.  RADIOLOGY:  No results found.   Management plans discussed with the patient, family and they are in agreement.  CODE STATUS:     Code Status Orders        Start     Ordered   05/04/16 0512  Full code  Continuous     05/04/16 0512    Code Status History    Date Active Date Inactive Code Status Order ID Comments User Context   This patient has a current code status but no historical code status.      TOTAL TIME TAKING CARE OF THIS PATIENT: 38 minutes.    Gladstone Lighter M.D on 05/08/2016 at 1:47 PM  Between 7am to 6pm - Pager - [phone]  After 6pm go to [email or web address] -  Proofreader  [facility]  Office  [phone]  CC: Primary care physician; Ria Bush, MD   Note: This dictation was prepared with Dragon dictation along with smaller phrase technology. Any transcriptional errors that result from this process are unintentional.

## 2016-05-08 NOTE — Progress Notes (Signed)
IV removed by patient asked if it can stay out since she is supposed to go home today. Spoke to dr Jannifer Franklin and since patient is no longer on IV zofran ok to leave IV out

## 2016-05-08 NOTE — Telephone Encounter (Signed)
-----   Message from Arie Sabina sent at 05/08/2016  2:01 PM EST ----- Regarding: tcm/ph 05/24/16 2:30 R. Idolina Primer, PA

## 2016-05-08 NOTE — Telephone Encounter (Signed)
Patient contacted regarding discharge from [facility] on 05/08/16.  Patient understands to follow up with Alexis Faith, PA on 05/24/16 at 2:30 at [facility]. Patient understands discharge instructions?  Yes Patient understands medications and regiment?  Yes Patient understands to bring all medications to this visit? Yes

## 2016-05-08 NOTE — Progress Notes (Signed)
Patient discharged home with daughter via wheelchair by volunteer services. Alexis Tucker

## 2016-05-08 NOTE — Progress Notes (Signed)
Discharge instructions given and went over with patient at bedside. All questions answered. [place] at bedside (patient reminded to take home when leaving). Patient to be discharged home with daughter. Awaiting transportation. Madlyn Frankel, RN

## 2016-05-08 NOTE — Progress Notes (Signed)
Patient tolerated soft diet well. Madlyn Frankel, RN

## 2016-05-10 ENCOUNTER — Telehealth: Payer: Self-pay

## 2016-05-10 ENCOUNTER — Ambulatory Visit: Payer: PRIVATE HEALTH INSURANCE | Admitting: Family Medicine

## 2016-05-10 DIAGNOSIS — Z0289 Encounter for other administrative examinations: Secondary | ICD-10-CM

## 2016-05-10 NOTE — Telephone Encounter (Signed)
Pt was scheduled for a hospital f/u appt with PCP on 05/10/16 but did not show up for appointment. Attempted to reach patient and emergency contact Tiffany Atkins. Both attempts unsuccessful. Left message on each phone with contact info.

## 2016-05-12 DIAGNOSIS — E11311 Type 2 diabetes mellitus with unspecified diabetic retinopathy with macular edema: Secondary | ICD-10-CM | POA: Diagnosis not present

## 2016-05-12 DIAGNOSIS — Z794 Long term (current) use of insulin: Secondary | ICD-10-CM | POA: Diagnosis not present

## 2016-05-12 DIAGNOSIS — I129 Hypertensive chronic kidney disease with stage 1 through stage 4 chronic kidney disease, or unspecified chronic kidney disease: Secondary | ICD-10-CM | POA: Diagnosis not present

## 2016-05-12 DIAGNOSIS — H3530 Unspecified macular degeneration: Secondary | ICD-10-CM | POA: Diagnosis not present

## 2016-05-12 DIAGNOSIS — K219 Gastro-esophageal reflux disease without esophagitis: Secondary | ICD-10-CM | POA: Diagnosis not present

## 2016-05-12 DIAGNOSIS — K529 Noninfective gastroenteritis and colitis, unspecified: Secondary | ICD-10-CM | POA: Diagnosis not present

## 2016-05-12 DIAGNOSIS — I739 Peripheral vascular disease, unspecified: Secondary | ICD-10-CM | POA: Diagnosis not present

## 2016-05-12 DIAGNOSIS — M1990 Unspecified osteoarthritis, unspecified site: Secondary | ICD-10-CM | POA: Diagnosis not present

## 2016-05-12 DIAGNOSIS — N184 Chronic kidney disease, stage 4 (severe): Secondary | ICD-10-CM | POA: Diagnosis not present

## 2016-05-12 DIAGNOSIS — Z853 Personal history of malignant neoplasm of breast: Secondary | ICD-10-CM | POA: Diagnosis not present

## 2016-05-12 DIAGNOSIS — D649 Anemia, unspecified: Secondary | ICD-10-CM | POA: Diagnosis not present

## 2016-05-12 DIAGNOSIS — Z85038 Personal history of other malignant neoplasm of large intestine: Secondary | ICD-10-CM | POA: Diagnosis not present

## 2016-05-18 DIAGNOSIS — N184 Chronic kidney disease, stage 4 (severe): Secondary | ICD-10-CM | POA: Diagnosis not present

## 2016-05-18 DIAGNOSIS — I129 Hypertensive chronic kidney disease with stage 1 through stage 4 chronic kidney disease, or unspecified chronic kidney disease: Secondary | ICD-10-CM | POA: Diagnosis not present

## 2016-05-18 DIAGNOSIS — M1990 Unspecified osteoarthritis, unspecified site: Secondary | ICD-10-CM | POA: Diagnosis not present

## 2016-05-18 DIAGNOSIS — D649 Anemia, unspecified: Secondary | ICD-10-CM | POA: Diagnosis not present

## 2016-05-18 DIAGNOSIS — K529 Noninfective gastroenteritis and colitis, unspecified: Secondary | ICD-10-CM | POA: Diagnosis not present

## 2016-05-18 DIAGNOSIS — E11311 Type 2 diabetes mellitus with unspecified diabetic retinopathy with macular edema: Secondary | ICD-10-CM | POA: Diagnosis not present

## 2016-05-19 DIAGNOSIS — E113312 Type 2 diabetes mellitus with moderate nonproliferative diabetic retinopathy with macular edema, left eye: Secondary | ICD-10-CM | POA: Diagnosis not present

## 2016-05-23 NOTE — Progress Notes (Deleted)
Cardiology Office Note Date:  05/23/2016  Patient ID:  Alexis Tucker, Alexis Tucker Mar 16, 1941, MRN [id number] PCP:  Ria Bush, MD  Cardiologist:  Dr. Fletcher Anon, MD  ***refresh   Chief Complaint: Hospital follow up  History of Present Illness: Alexis Tucker is a 75 y.o. 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   She does not have any previously diagnosed cardiac history. She was admitted to [facility] for a 2 day history of persistent nausea and vomiting. Never with chest pain or SOB. Troponin was checked in the ED with a peak of 0.62, down trending. Cardiology was asked to evaluate patient's elevated troponin without symptoms of angina. Echo on 11/2 showed normal LV systolic function with an EF of 60-65%, no RWMA, GR1DD, left atrium mildly dilated, mitral valve with a calcified annulus, RV systolic function was normal, PASP 42 mmHg. Cardiology advised outpatient stress testing for risk stratification given elevated troponin. GI saw the patient and performed an EGD that showed significant inflammation in the duodenal bulb treated with PPI. MRCP was negative for pancreatitis. Post procedure she developed Afib for a brief amount of time. Upon cardiology rounding on patient the following day she was back in sinus rhythm. She was found to not be a candidate for anticoagulation given GI ulcers and was placed on ASA.  ***   Past Medical History:  Diagnosis Date  . Anemia    per prior pcp  . Carotid stenosis, asymptomatic 01/2014   mild ICA, severe in ECA, rec rpt [place] 2 years  . Cataracts, bilateral   . CKD (chronic kidney disease) stage 4, GFR 15-29 ml/min (HCC) 01/2012   Cr 3.85, in 3s since 2010, discussing HD and L forearm graft (Singh/Schnier)  . Diabetes type 2, controlled ([place]) 2000s  . Diabetic retinopathy with macular edema ([place]) 03/2014   [place] at [facility] eye on avastin  . DJD (degenerative joint disease)    per prior pcp  . GERD (gastroesophageal reflux disease)   . Glaucoma 03/2014  . History of breast cancer 2008   infiltrating ductal carcinoma s/p mammosite  . History of colon cancer 2006   s/p colectomy  . History of hepatitis A 1990s   from food, resolved  . HTN (hypertension)   . Macular degeneration    wet R with vision loss, dry L; to see retina specialist  . Neuropathy ([place])    per prior pcp  . OA (osteoarthritis)    knees and hips  . Osteonecrosis (HCC)    hips?  . Post-surgical hypothyroidism     Past Surgical History:  Procedure Laterality Date  . ABDOMINAL HYSTERECTOMY  1976   after tubal pregnancy, h/o fibroids with heavy bleeding  . BREAST MAMMOSITE  2008  . carotid ultrasound  12/2012   1-39% bilateral stenosis, severe in ECA  . CHOLECYSTECTOMY  2005  . COLECTOMY  2005   colon cancer  . COLON SURGERY  2006   bowel obstruction  . COLONOSCOPY  2008   ext hem, ileo-colonic anastomosis in cecum, tubular adenoma x1, rec rpt 1 yr for multiple polyps (in [place])  . ESOPHAGOGASTRODUODENOSCOPY N/A 05/06/2016   Procedure: ESOPHAGOGASTRODUODENOSCOPY (EGD);  Surgeon: Herbie Baltimore  Federico Flake, MD;  Location: [facility] ENDOSCOPY;  Service: Endoscopy;  Laterality: N/A;  . THYROIDECTOMY, PARTIAL     unclear why  . TOTAL KNEE ARTHROPLASTY Right 1999  . US ECHOCARDIOGRAPHY  11/2007   nl LV fxn EF 24%, diastolic dysfunction, LVH, tr TR and MR, slcerotic aortic valve    Current Outpatient Prescriptions  Medication Sig Dispense Refill  . acetaminophen (TYLENOL) 325 MG tablet Take 2 tablets (650 mg total) by mouth every 6 (six) hours as needed for mild pain (or Fever >/= 101). 30 tablet 0  . allopurinol (ZYLOPRIM) 100 MG tablet TAKE 1 TABLET BY  MOUTH DAILY 90 tablet 3  . anastrozole (ARIMIDEX) 1 MG tablet TAKE 1 TABLET BY MOUTH DAILY 90 tablet 1  . ARMOUR THYROID 60 MG tablet TAKE 1 TABLET BY MOUTH DAILY -NEED PHYSICAL 30 tablet 11  . aspirin EC 81 MG EC tablet Take 1 tablet (81 mg total) by mouth daily. 30 tablet 12  . Blood Glucose Monitoring Suppl (ACURA BLOOD GLUCOSE METER) W/DEVICE KIT Use as directed to check sugars daily 250.42 1 kit 0  . Calcium-Magnesium-Vitamin D (CALCIUM MAGNESIUM PO) Take 1 tablet by mouth daily.    . carvedilol (COREG) 25 MG tablet TAKE 1 TABLET BY MOUTH DAILY 90 tablet 2  . Cholecalciferol (VITAMIN D-3) 5000 UNITS TABS Take 1 tablet by mouth daily.    Marland Kitchen FERROUS GLUCONATE PO Take 28 mg by mouth daily.    . Folic Acid-Vit M3-NTI R44 (FOLBEE) 2.5-25-1 MG TABS tablet Take 1 tablet by mouth daily.    . hydrALAZINE (APRESOLINE) 50 MG tablet Take 1 tablet (50 mg total) by mouth 3 (three) times daily. 90 tablet 2  . isosorbide dinitrate (ISORDIL) 20 MG tablet TAKE 1 TABLET BY MOUTH DAILY 90 tablet 3  . latanoprost (XALATAN) 0.005 % ophthalmic solution Place 1 drop into both eyes at bedtime.    . ONGLYZA 5 MG TABS tablet TAKE 1 TABLET BY MOUTH DAILY 30 tablet 11  . pantoprazole (PROTONIX) 40 MG tablet Take 1 tablet (40 mg total) by mouth 2 (two) times daily. 60 tablet 2  . pentoxifylline (TRENTAL) 400 MG CR tablet TAKE 1 TABLET BY MOUTH DAILY 90 tablet 3  . promethazine (PHENERGAN) 25 MG tablet Take 1 tablet (25 mg total) by mouth every 6 (six) hours as needed for nausea or vomiting. 30 tablet 0   No current facility-administered medications for this visit.     Allergies:   Codeine; Tramadol; and Sulfa antibiotics   Social History:  The patient  reports that she has quit smoking. She has never used smokeless tobacco. She reports that she does not drink alcohol or use drugs.   Family History:  The patient's family history includes Alcohol abuse in her father; Arthritis in her father and mother; Diabetes in her  mother; Hypertension in her mother; Stroke in her daughter.  ROS:   ROS   PHYSICAL EXAM: *** VS:  There were no vitals taken for this visit. BMI: There is no height or weight on file to calculate BMI.  Physical Exam   EKG:  Was ordered and interpreted by me today. Shows ***  Recent Labs: 05/04/2016: Hemoglobin 12.2; Platelets 119; TSH 1.243 05/07/2016: ALT 13; BUN 31; Creatinine, Ser 3.94; Potassium 3.7; Sodium 141  05/05/2016: Cholesterol 152; HDL 39; LDL Cholesterol 94; Total CHOL/HDL Ratio 3.9; Triglycerides 94; VLDL 19   CrCl cannot be calculated (Unknown ideal weight.).   Wt Readings from Last 3 Encounters:  05/08/16  218 lb 9.6 oz (99.2 kg)  07/27/15 192 lb (87.1 kg)  03/13/14 237 lb 12 oz (107.8 kg)     Other studies reviewed: Additional studies/records reviewed today include: summarized above  ASSESSMENT AND PLAN:  1. ***  Disposition: F/u with *** in   Current medicines are reviewed at length with the patient today.  The patient did not have any concerns regarding medicines.  Melvern Banker PA-C 05/23/2016 11:28 AM     [facility] [address] [place] [place], [place] [address] [phone]

## 2016-05-24 ENCOUNTER — Encounter: Payer: Self-pay | Admitting: *Deleted

## 2016-05-24 ENCOUNTER — Encounter: Payer: PRIVATE HEALTH INSURANCE | Admitting: Physician Assistant

## 2016-05-27 NOTE — Telephone Encounter (Signed)
Pt DNKA appt with cardiology this week. plz attempt to contact pt again re importance of f/u at here or cards, if unable to reach, plz mail certified letter regarding above. Thank you.

## 2016-05-29 ENCOUNTER — Other Ambulatory Visit: Payer: PRIVATE HEALTH INSURANCE

## 2016-05-29 ENCOUNTER — Ambulatory Visit: Payer: PRIVATE HEALTH INSURANCE | Attending: Family Medicine

## 2016-06-07 DIAGNOSIS — H401123 Primary open-angle glaucoma, left eye, severe stage: Secondary | ICD-10-CM | POA: Diagnosis not present

## 2016-06-09 DIAGNOSIS — E11311 Type 2 diabetes mellitus with unspecified diabetic retinopathy with macular edema: Secondary | ICD-10-CM | POA: Diagnosis not present

## 2016-06-09 DIAGNOSIS — D649 Anemia, unspecified: Secondary | ICD-10-CM | POA: Diagnosis not present

## 2016-06-09 DIAGNOSIS — M1990 Unspecified osteoarthritis, unspecified site: Secondary | ICD-10-CM | POA: Diagnosis not present

## 2016-06-09 DIAGNOSIS — K529 Noninfective gastroenteritis and colitis, unspecified: Secondary | ICD-10-CM | POA: Diagnosis not present

## 2016-06-09 DIAGNOSIS — I129 Hypertensive chronic kidney disease with stage 1 through stage 4 chronic kidney disease, or unspecified chronic kidney disease: Secondary | ICD-10-CM | POA: Diagnosis not present

## 2016-06-09 DIAGNOSIS — N184 Chronic kidney disease, stage 4 (severe): Secondary | ICD-10-CM | POA: Diagnosis not present

## 2016-07-14 DIAGNOSIS — E113312 Type 2 diabetes mellitus with moderate nonproliferative diabetic retinopathy with macular edema, left eye: Secondary | ICD-10-CM | POA: Diagnosis not present

## 2016-08-01 DIAGNOSIS — H2513 Age-related nuclear cataract, bilateral: Secondary | ICD-10-CM | POA: Diagnosis not present

## 2016-08-07 ENCOUNTER — Other Ambulatory Visit: Payer: Self-pay | Admitting: Family Medicine

## 2016-08-09 ENCOUNTER — Encounter: Payer: Self-pay | Admitting: *Deleted

## 2016-08-11 NOTE — Discharge Instructions (Signed)
Cataract Surgery, Care After °Refer to this sheet in the next few weeks. These instructions provide you with information about caring for yourself after your procedure. Your health care provider may also give you more specific instructions. Your treatment has been planned according to current medical practices, but problems sometimes occur. Call your health care provider if you have any problems or questions after your procedure. °What can I expect after the procedure? °After the procedure, it is common to have: °· Itching. °· Discomfort. °· Fluid discharge. °· Sensitivity to light and to touch. °· Bruising. °Follow these instructions at home: °Eye Care  °· Check your eye every day for signs of infection. Watch for: °¨ Redness, swelling, or pain. °¨ Fluid, blood, or pus. °¨ Warmth. °¨ Bad smell. °Activity  °· Avoid strenuous activities, such as playing contact sports, for as long as told by your health care provider. °· Do not drive or operate heavy machinery until your health care provider approves. °· Do not bend or lift heavy objects . Bending increases pressure in the eye. You can walk, climb stairs, and do light household chores. °· Ask your health care provider when you can return to work. If you work in a dusty environment, you may be advised to wear protective eyewear for a period of time. °General instructions  °· Take or apply over-the-counter and prescription medicines only as told by your health care provider. This includes eye drops. °· Do not touch or rub your eyes. °· If you were given a protective shield, wear it as told by your health care provider. If you were not given a protective shield, wear sunglasses as told by your health care provider to protect your eyes. °· Keep the area around your eye clean and dry. Avoid swimming or allowing water to hit you directly in the face while showering until told by your health care provider. Keep soap and shampoo out of your eyes. °· Do not put a contact lens  into the affected eye or eyes until your health care provider approves. °· Keep all follow-up visits as told by your health care provider. This is important. °Contact a health care provider if: ° °· You have increased bruising around your eye. °· You have pain that is not helped with medicine. °· You have a fever. °· You have redness, swelling, or pain in your eye. °· You have fluid, blood, or pus coming from your incision. °· Your vision gets worse. °Get help right away if: °· You have sudden vision loss. °This information is not intended to replace advice given to you by your health care provider. Make sure you discuss any questions you have with your health care provider. °Document Released: 01/06/2005 Document Revised: 10/28/2015 Document Reviewed: 04/29/2015 °Elsevier Interactive Patient Education © 2017 [employer] Inc[employer] ° ° ° ° °General Anesthesia, Adult, Care After °These instructions provide you with information about caring for yourself after your procedure. Your health care provider may also give you more specific instructions. Your treatment has been planned according to current medical practices, but problems sometimes occur. Call your health care provider if you have any problems or questions after your procedure. °What can I expect after the procedure? °After the procedure, it is common to have: °· Vomiting. °· A sore throat. °· Mental slowness. °It is common to feel: °· Nauseous. °· Cold or shivery. °· Sleepy. °· Tired. °· Sore or achy, even in parts of your body where you did not have surgery. °Follow these instructions at   home: °For at least 24 hours after the procedure:  °· Do not: °¨ Participate in activities where you could fall or become injured. °¨ Drive. °¨ Use heavy machinery. °¨ Drink alcohol. °¨ Take sleeping pills or medicines that cause drowsiness. °¨ Make important decisions or sign legal documents. °¨ Take care of children on your own. °· Rest. °Eating and drinking  °· If you vomit, drink  water, juice, or soup when you can drink without vomiting. °· Drink enough fluid to keep your urine clear or pale yellow. °· Make sure you have little or no nausea before eating solid foods. °· Follow the diet recommended by your health care provider. °General instructions  °· Have a responsible adult stay with you until you are awake and alert. °· Return to your normal activities as told by your health care provider. Ask your health care provider what activities are safe for you. °· Take over-the-counter and prescription medicines only as told by your health care provider. °· If you smoke, do not smoke without supervision. °· Keep all follow-up visits as told by your health care provider. This is important. °Contact a health care provider if: °· You continue to have nausea or vomiting at home, and medicines are not helpful. °· You cannot drink fluids or start eating again. °· You cannot urinate after 8-12 hours. °· You develop a skin rash. °· You have fever. °· You have increasing redness at the site of your procedure. °Get help right away if: °· You have difficulty breathing. °· You have chest pain. °· You have unexpected bleeding. °· You feel that you are having a life-threatening or urgent problem. °This information is not intended to replace advice given to you by your health care provider. Make sure you discuss any questions you have with your health care provider. °Document Released: 09/25/2000 Document Revised: 11/22/2015 Document Reviewed: 06/03/2015 °Elsevier Interactive Patient Education © 2017 Elsevier Inc. ° °

## 2016-08-12 ENCOUNTER — Other Ambulatory Visit: Payer: Self-pay | Admitting: Family Medicine

## 2016-08-14 ENCOUNTER — Ambulatory Visit: Payer: Medicare Other | Admitting: Anesthesiology

## 2016-08-14 ENCOUNTER — Encounter
Admission: RE | Disposition: A | Discharge status: Home / Self Care | Payer: Self-pay | Source: Ambulatory Visit | Attending: Ophthalmology

## 2016-08-14 ENCOUNTER — Ambulatory Visit
Admission: RE | Admit: 2016-08-14 | Discharge: 2016-08-14 | Disposition: A | Discharge status: Home / Self Care | Payer: Medicare Other | Source: Ambulatory Visit | Attending: Ophthalmology | Admitting: Ophthalmology

## 2016-08-14 DIAGNOSIS — I10 Essential (primary) hypertension: Secondary | ICD-10-CM | POA: Insufficient documentation

## 2016-08-14 DIAGNOSIS — Z87891 Personal history of nicotine dependence: Secondary | ICD-10-CM | POA: Insufficient documentation

## 2016-08-14 DIAGNOSIS — E039 Hypothyroidism, unspecified: Secondary | ICD-10-CM | POA: Diagnosis not present

## 2016-08-14 DIAGNOSIS — E1136 Type 2 diabetes mellitus with diabetic cataract: Secondary | ICD-10-CM | POA: Insufficient documentation

## 2016-08-14 DIAGNOSIS — H2512 Age-related nuclear cataract, left eye: Secondary | ICD-10-CM | POA: Diagnosis not present

## 2016-08-14 DIAGNOSIS — Z79899 Other long term (current) drug therapy: Secondary | ICD-10-CM | POA: Insufficient documentation

## 2016-08-14 DIAGNOSIS — H2513 Age-related nuclear cataract, bilateral: Secondary | ICD-10-CM | POA: Diagnosis not present

## 2016-08-14 HISTORY — DX: Presence of dental prosthetic device (complete) (partial): Z97.2

## 2016-08-14 HISTORY — DX: Other specified postprocedural states: Z98.890

## 2016-08-14 HISTORY — DX: Nausea with vomiting, unspecified: R11.2

## 2016-08-14 HISTORY — PX: CATARACT EXTRACTION W/PHACO: SHX586

## 2016-08-14 LAB — GLUCOSE, CAPILLARY
Glucose-Capillary: 131 mg/dL — ABNORMAL HIGH (ref 65–99)
Glucose-Capillary: 131 mg/dL — ABNORMAL HIGH (ref 65–99)

## 2016-08-14 SURGERY — PHACOEMULSIFICATION, CATARACT, WITH IOL INSERTION
Anesthesia: Monitor Anesthesia Care | Site: Eye | Laterality: Left | Wound class: Clean

## 2016-08-14 MED ORDER — FENTANYL CITRATE (PF) 100 MCG/2ML IJ SOLN
INTRAMUSCULAR | Status: DC | PRN
  Administered 2016-08-14: 50 ug via INTRAVENOUS

## 2016-08-14 MED ORDER — EPINEPHRINE PF 1 MG/ML IJ SOLN
INTRAMUSCULAR | Status: DC | PRN
  Administered 2016-08-14: 78 mL via OPHTHALMIC

## 2016-08-14 MED ORDER — ONDANSETRON HCL 4 MG/2ML IJ SOLN
4.0000 mg | Freq: Once | INTRAMUSCULAR | Status: AC
  Administered 2016-08-14: 4 mg via INTRAVENOUS

## 2016-08-14 MED ORDER — BRIMONIDINE TARTRATE 0.2 % OP SOLN: OPHTHALMIC | Status: DC | PRN

## 2016-08-14 MED ORDER — ARMC OPHTHALMIC DILATING DROPS
1.0000 "application " | OPHTHALMIC | Status: DC | PRN
  Administered 2016-08-14 (×3): 1 via OPHTHALMIC

## 2016-08-14 MED ORDER — LIDOCAINE HCL (PF) 4 % IJ SOLN
INTRAOCULAR | Status: DC | PRN
  Administered 2016-08-14: 2 mL via OPHTHALMIC

## 2016-08-14 MED ORDER — MOXIFLOXACIN HCL 0.5 % OP SOLN
1.0000 [drp] | OPHTHALMIC | Status: DC | PRN
  Administered 2016-08-14 (×3): 1 [drp] via OPHTHALMIC

## 2016-08-14 MED ORDER — NA HYALUR & NA CHOND-NA HYALUR 0.4-0.35 ML IO KIT
PACK | INTRAOCULAR | Status: DC | PRN
  Administered 2016-08-14: 1 mL via INTRAOCULAR

## 2016-08-14 MED ORDER — BRIMONIDINE TARTRATE-TIMOLOL 0.2-0.5 % OP SOLN
OPHTHALMIC | Status: DC | PRN
  Administered 2016-08-14: 1 [drp] via OPHTHALMIC

## 2016-08-14 MED ORDER — CEFUROXIME OPHTHALMIC INJECTION 1 MG/0.1 ML
INJECTION | OPHTHALMIC | Status: DC | PRN
  Administered 2016-08-14: .3 mL via INTRACAMERAL

## 2016-08-14 MED ORDER — MIDAZOLAM HCL 2 MG/2ML IJ SOLN
INTRAMUSCULAR | Status: DC | PRN
  Administered 2016-08-14: 2 mg via INTRAVENOUS

## 2016-08-14 MED ORDER — LACTATED RINGERS IV SOLN: INTRAVENOUS | Status: DC

## 2016-08-14 SURGICAL SUPPLY — 22 items
APPLICATOR COTTON TIP 3IN (MISCELLANEOUS) ×3 IMPLANT
CANNULA ANT/CHMB 27GA (MISCELLANEOUS) ×3 IMPLANT
DISSECTOR HYDRO NUCLEUS 50X22 (MISCELLANEOUS) ×3 IMPLANT
GLOVE BIO SURGEON STRL SZ7 (GLOVE) ×3 IMPLANT
GLOVE SURG LX 6.5 MICRO (GLOVE) ×2
GLOVE SURG LX STRL 6.5 MICRO (GLOVE) ×1 IMPLANT
GOWN STRL REUS W/ TWL LRG LVL3 (GOWN DISPOSABLE) ×2 IMPLANT
GOWN STRL REUS W/TWL LRG LVL3 (GOWN DISPOSABLE) ×4
LENS IOL ACRYSOF IQ 21.0 (Intraocular Lens) ×3 IMPLANT
MARKER SKIN DUAL TIP RULER LAB (MISCELLANEOUS) ×3 IMPLANT
PACK CATARACT BRASINGTON (MISCELLANEOUS) ×3 IMPLANT
PACK EYE AFTER SURG (MISCELLANEOUS) ×3 IMPLANT
PACK OPTHALMIC (MISCELLANEOUS) ×3 IMPLANT
RING MALYGIN 7.0 (MISCELLANEOUS) IMPLANT
SUT ETHILON 10-0 CS-B-6CS-B-6 (SUTURE)
SUT VICRYL  9 0 (SUTURE)
SUT VICRYL 9 0 (SUTURE) IMPLANT
SUTURE EHLN 10-0 CS-B-6CS-B-6 (SUTURE) IMPLANT
SYR TB 1ML LUER SLIP (SYRINGE) ×3 IMPLANT
WATER STERILE IRR 250ML POUR (IV SOLUTION) ×3 IMPLANT
WICK EYE OCUCEL (MISCELLANEOUS) IMPLANT
WIPE NON LINTING 3.25X3.25 (MISCELLANEOUS) ×3 IMPLANT

## 2016-08-14 NOTE — Transfer of Care (Signed)
Immediate Anesthesia Transfer of Care Note  Patient: Alexis Tucker  Procedure(s) Performed: Procedure(s) with comments: CATARACT EXTRACTION PHACO AND INTRAOCULAR LENS PLACEMENT (IOC)  Left Diabetic (Left) - Diabetic - oral meds  Patient Location: PACU  Anesthesia Type: MAC  Level of Consciousness: awake, alert  and patient cooperative  Airway and Oxygen Therapy: Patient Spontanous Breathing and Patient connected to supplemental oxygen  Post-op Assessment: Post-op Vital signs reviewed, Patient's Cardiovascular Status Stable, Respiratory Function Stable, Patent Airway and No signs of Nausea or vomiting  Post-op Vital Signs: Reviewed and stable  Complications: No apparent anesthesia complications

## 2016-08-14 NOTE — Anesthesia Procedure Notes (Signed)
Procedure Name: MAC Performed by: Briante Loveall Pre-anesthesia Checklist: Patient identified, Emergency Drugs available, Suction available, Timeout performed and Patient being monitored Patient Re-evaluated:Patient Re-evaluated prior to inductionOxygen Delivery Method: Nasal cannula Placement Confirmation: positive ETCO2     

## 2016-08-14 NOTE — H&P (Signed)
H+P reviewed and is up to date, please see paper chart.  

## 2016-08-14 NOTE — Anesthesia Preprocedure Evaluation (Signed)
Anesthesia Evaluation  Patient identified by MRN, date of birth, ID band Patient awake    Reviewed: Allergy & Precautions, H&P , NPO status , Patient's Chart, lab work & pertinent test results  History of Anesthesia Complications (+) PONV and history of anesthetic complications  Airway Mallampati: II  TM Distance: >3 FB Neck ROM: full    Dental no notable dental hx.    Pulmonary neg pulmonary ROS, former smoker,    Pulmonary exam normal        Cardiovascular hypertension, On Medications Normal cardiovascular exam     Neuro/Psych negative neurological ROS  negative psych ROS   GI/Hepatic Neg liver ROS, Medicated,  Endo/Other  diabetes, Well ControlledHypothyroidism   Renal/GU   negative genitourinary   Musculoskeletal   Abdominal   Peds  Hematology   Anesthesia Other Findings   Reproductive/Obstetrics                             Anesthesia Physical Anesthesia Plan  ASA: II  Anesthesia Plan: MAC   Post-op Pain Management:    Induction:   Airway Management Planned:   Additional Equipment:   Intra-op Plan:   Post-operative Plan:   Informed Consent: I have reviewed the patients History and Physical, chart, labs and discussed the procedure including the risks, benefits and alternatives for the proposed anesthesia with the patient or authorized representative who has indicated his/her understanding and acceptance.     Plan Discussed with:   Anesthesia Plan Comments:         Anesthesia Quick Evaluation

## 2016-08-14 NOTE — Anesthesia Postprocedure Evaluation (Signed)
Anesthesia Post Note  Patient: Alexis Tucker  Procedure(s) Performed: Procedure(s) (LRB): CATARACT EXTRACTION PHACO AND INTRAOCULAR LENS PLACEMENT (IOC)  Left Diabetic (Left)  Patient location during evaluation: PACU Anesthesia Type: MAC Level of consciousness: awake and alert Pain management: pain level controlled Vital Signs Assessment: post-procedure vital signs reviewed and stable Respiratory status: spontaneous breathing Cardiovascular status: blood pressure returned to baseline Postop Assessment: no headache Anesthetic complications: no    Jaci Standard, III,  Keyshawn Hellwig D

## 2016-08-14 NOTE — Op Note (Signed)
Date of Surgery: 08/14/2016  PREOPERATIVE DIAGNOSES: Visually significant nuclear sclerotic cataract, left eye.  POSTOPERATIVE DIAGNOSES: Same  PROCEDURES PERFORMED: Cataract extraction with intraocular lens implant, left eye.  SURGEON: Almon Hercules, M.D.  ANESTHESIA: MAC and topical  IMPLANTS: [id number] +21.0 D  Implant Name Type Inv. Item Serial No. Manufacturer Lot No. LRB No. Used  LENS IOL ACRYSOF IQ 21.0 - [id number] Intraocular Lens LENS IOL ACRYSOF IQ 21.0 [id number] ALCON   Left 1    COMPLICATIONS: None.  DESCRIPTION OF PROCEDURE: Therapeutic options were discussed with the patient preoperatively, including a discussion of risks and benefits of surgery. Informed consent was obtained. An IOL-Master and immersion biometry were used to take the lens measurements, and a dilated fundus exam was performed within 6 months of the surgical date.  The patient was premedicated and brought to the operating room and placed on the operating table in the supine position. After adequate anesthesia, the patient was prepped and draped in the usual sterile ophthalmic fashion. A wire lid speculum was inserted and the microscope was positioned. A Superblade was used to create a paracentesis site at the limbus and a small amount of dilute preservative free lidocaine was instilled into the anterior chamber, followed by dispersive viscoelastic. A clear corneal incision was created temporally using a 2.4 mm keratome blade. Capsulorrhexis was then performed. In situ phacoemulsification was performed.  Cortical material was removed with the irrigation-aspiration unit. Dispersive viscoelastic was instilled to open the capsular bag. A posterior chamber intraocular lens with the specifications above was inserted and positioned. Irrigation-aspiration was used to remove all viscoelastic. Cefuroxime 1cc was instilled into the anterior chamber, and the corneal incision was checked and found to be water tight. The  eyelid speculum was removed.  The operative eye was covered with protective goggles after instilling 1 drop of Combigan. The patient tolerated the procedure well. There were no complications.

## 2016-08-15 ENCOUNTER — Encounter: Payer: Self-pay | Admitting: Ophthalmology

## 2016-09-01 DIAGNOSIS — E113312 Type 2 diabetes mellitus with moderate nonproliferative diabetic retinopathy with macular edema, left eye: Secondary | ICD-10-CM | POA: Diagnosis not present

## 2016-09-05 ENCOUNTER — Other Ambulatory Visit: Payer: Self-pay | Admitting: Family Medicine

## 2016-09-06 ENCOUNTER — Encounter: Payer: Self-pay | Admitting: *Deleted

## 2016-09-07 ENCOUNTER — Telehealth: Payer: Self-pay | Admitting: Family Medicine

## 2016-09-07 NOTE — Telephone Encounter (Signed)
LVM for pt to call back and schedule AWV + labs with Lesia and CPE with PCP. °

## 2016-09-08 ENCOUNTER — Telehealth: Payer: Self-pay | Admitting: *Deleted

## 2016-09-08 NOTE — Telephone Encounter (Signed)
PA for Armour Thyroid completed and denied. Notice in your IN box for review.

## 2016-09-15 NOTE — Telephone Encounter (Signed)
plz notify pt this was denied - options are change to levothyroxine or continue to pay out of pocket for med. Let us know what she would like to do.  Overdue for f/u - plz schedule 30 min appt.

## 2016-09-15 NOTE — Telephone Encounter (Signed)
Called and left voicemail for pt to return call to office.  

## 2016-09-20 DIAGNOSIS — H209 Unspecified iridocyclitis: Secondary | ICD-10-CM | POA: Diagnosis not present

## 2016-09-24 ENCOUNTER — Other Ambulatory Visit: Payer: Self-pay | Admitting: Family Medicine

## 2016-10-23 ENCOUNTER — Other Ambulatory Visit: Payer: Self-pay | Admitting: Family Medicine

## 2016-10-24 NOTE — Telephone Encounter (Signed)
Scheduled 11/02/16

## 2016-11-01 ENCOUNTER — Other Ambulatory Visit: Payer: Self-pay | Admitting: Family Medicine

## 2016-11-01 DIAGNOSIS — E1165 Type 2 diabetes mellitus with hyperglycemia: Secondary | ICD-10-CM

## 2016-11-01 DIAGNOSIS — IMO0002 Reserved for concepts with insufficient information to code with codable children: Secondary | ICD-10-CM

## 2016-11-01 DIAGNOSIS — E890 Postprocedural hypothyroidism: Secondary | ICD-10-CM

## 2016-11-01 DIAGNOSIS — N185 Chronic kidney disease, stage 5: Secondary | ICD-10-CM

## 2016-11-01 DIAGNOSIS — E1122 Type 2 diabetes mellitus with diabetic chronic kidney disease: Secondary | ICD-10-CM

## 2016-11-02 ENCOUNTER — Ambulatory Visit (INDEPENDENT_AMBULATORY_CARE_PROVIDER_SITE_OTHER): Payer: Medicare Other

## 2016-11-02 ENCOUNTER — Telehealth: Payer: Self-pay | Admitting: Radiology

## 2016-11-02 ENCOUNTER — Telehealth: Payer: Self-pay | Admitting: Family Medicine

## 2016-11-02 VITALS — BP 142/82 | HR 76 | Temp 97.5°F | Ht 63.0 in | Wt 204.0 lb

## 2016-11-02 DIAGNOSIS — E890 Postprocedural hypothyroidism: Secondary | ICD-10-CM

## 2016-11-02 DIAGNOSIS — N185 Chronic kidney disease, stage 5: Secondary | ICD-10-CM

## 2016-11-02 DIAGNOSIS — E1122 Type 2 diabetes mellitus with diabetic chronic kidney disease: Secondary | ICD-10-CM

## 2016-11-02 DIAGNOSIS — E1165 Type 2 diabetes mellitus with hyperglycemia: Secondary | ICD-10-CM

## 2016-11-02 DIAGNOSIS — Z0000 Encounter for general adult medical examination without abnormal findings: Secondary | ICD-10-CM

## 2016-11-02 DIAGNOSIS — IMO0002 Reserved for concepts with insufficient information to code with codable children: Secondary | ICD-10-CM

## 2016-11-02 LAB — COMPREHENSIVE METABOLIC PANEL
ALK PHOS: 76 U/L (ref 39–117)
ALT: 10 U/L (ref 0–35)
AST: 14 U/L (ref 0–37)
Albumin: 4.3 g/dL (ref 3.5–5.2)
BUN: 31 mg/dL — AB (ref 6–23)
CHLORIDE: 112 meq/L (ref 96–112)
CO2: 20 mEq/L (ref 19–32)
Calcium: 9.4 mg/dL (ref 8.4–10.5)
Creatinine, Ser: 4.03 mg/dL — ABNORMAL HIGH (ref 0.40–1.20)
GFR: 13.97 mL/min — CL (ref >60.00)
GLUCOSE: 115 mg/dL — AB (ref 70–99)
POTASSIUM: 4.4 meq/L (ref 3.5–5.1)
SODIUM: 143 meq/L (ref 135–145)
TOTAL PROTEIN: 7.6 g/dL (ref 6.0–8.3)
Total Bilirubin: 0.5 mg/dL (ref 0.2–1.2)

## 2016-11-02 LAB — TSH: TSH: 4.9 u[IU]/mL — AB (ref 0.35–4.50)

## 2016-11-02 LAB — LIPID PANEL
Cholesterol: 183 mg/dL (ref 0–200)
HDL: 65.4 mg/dL (ref >39.00)
LDL Cholesterol: 101 mg/dL — ABNORMAL HIGH (ref 0–99)
NONHDL: 117.84
Total CHOL/HDL Ratio: 3
Triglycerides: 83 mg/dL (ref 0.0–149.0)
VLDL: 16.6 mg/dL (ref 0.0–40.0)

## 2016-11-02 LAB — HEMOGLOBIN A1C: Hgb A1c MFr Bld: 6.5 % (ref 4.6–6.5)

## 2016-11-02 NOTE — Telephone Encounter (Signed)
[facility] called a critical result, GFR 13.96. Results given to Dr Damita Dunnings

## 2016-11-02 NOTE — Progress Notes (Signed)
Pre visit review using our clinic review tool, if applicable. No additional management support is needed unless otherwise documented below in the visit note. 

## 2016-11-02 NOTE — Telephone Encounter (Signed)
Notified about GFR. This is with Cr similar to prev, with known stage 5 CKD.  Routed to PCP.   This isn't emergent since her BUN and lytes are okay.   I'll defer to PCP.

## 2016-11-02 NOTE — Patient Instructions (Signed)
Ms. Giannotti , Thank you for taking time to come for your Medicare Wellness Visit. I appreciate your ongoing commitment to your health goals. Please review the following plan we discussed and let me know if I can assist you in the future.   These are the goals we discussed: Goals    . safety          Starting 11/02/2016, I will continue to use cane as needed to reduce risk of falls.        This is a list of the screening recommended for you and due dates:  Health Maintenance  Topic Date Due  . Complete foot exam   11/07/2016*  . Mammogram  11/02/2017*  . DEXA scan (bone density measurement)  11/02/2017*  . DTaP/Tdap/Td vaccine (1 - Tdap) 11/03/2026*  . Tetanus Vaccine  11/03/2026*  . Colon Cancer Screening  01/27/2017  . Flu Shot  01/31/2017  . Hemoglobin A1C  05/05/2017  . Eye exam for diabetics  10/20/2017  . Pneumonia vaccines  Completed  *Topic was postponed. The date shown is not the original due date.   Preventive Care for Adults  A healthy lifestyle and preventive care can promote health and wellness. Preventive health guidelines for adults include the following key practices.  . A routine yearly physical is a good way to check with your health care provider about your health and preventive screening. It is a chance to share any concerns and updates on your health and to receive a thorough exam.  . Visit your dentist for a routine exam and preventive care every 6 months. Brush your teeth twice a day and floss once a day. Good oral hygiene prevents tooth decay and gum disease.  . The frequency of eye exams is based on your age, health, family medical history, use  of contact lenses, and other factors. Follow your health care provider's ecommendations for frequency of eye exams.  . Eat a healthy diet. Foods like vegetables, fruits, whole grains, low-fat dairy products, and lean protein foods contain the nutrients you need without too many calories. Decrease your intake of foods  high in solid fats, added sugars, and salt. Eat the right amount of calories for you. Get information about a proper diet from your health care provider, if necessary.  . Regular physical exercise is one of the most important things you can do for your health. Most adults should get at least 150 minutes of moderate-intensity exercise (any activity that increases your heart rate and causes you to sweat) each week. In addition, most adults need muscle-strengthening exercises on 2 or more days a week.  Silver Sneakers may be a benefit available to you. To determine eligibility, you may visit the website: www.silversneakers.com or contact program at 3026547488 Mon-Fri between 8AM-8PM.   . Maintain a healthy weight. The body mass index (BMI) is a screening tool to identify possible weight problems. It provides an estimate of body fat based on height and weight. Your health care provider can find your BMI and can help you achieve or maintain a healthy weight.   For adults 20 years and older: ? A BMI below 18.5 is considered underweight. ? A BMI of 18.5 to 24.9 is normal. ? A BMI of 25 to 29.9 is considered overweight. ? A BMI of 30 and above is considered obese.   . Maintain normal blood lipids and cholesterol levels by exercising and minimizing your intake of saturated fat. Eat a balanced diet with plenty of fruit  and vegetables. Blood tests for lipids and cholesterol should begin at age 26 and be repeated every 5 years. If your lipid or cholesterol levels are high, you are over 50, or you are at high risk for heart disease, you may need your cholesterol levels checked more frequently. Ongoing high lipid and cholesterol levels should be treated with medicines if diet and exercise are not working.  . If you smoke, find out from your health care provider how to quit. If you do not use tobacco, please do not start.  . If you choose to drink alcohol, please do not consume more than 2 drinks per day.  One drink is considered to be 12 ounces (355 mL) of beer, 5 ounces (148 mL) of wine, or 1.5 ounces (44 mL) of liquor.  . If you are 67-33 years old, ask your health care provider if you should take aspirin to prevent strokes.  . Use sunscreen. Apply sunscreen liberally and repeatedly throughout the day. You should seek shade when your shadow is shorter than you. Protect yourself by wearing long sleeves, pants, a wide-brimmed hat, and sunglasses year round, whenever you are outdoors.  . Once a month, do a whole body skin exam, using a mirror to look at the skin on your back. Tell your health care provider of new moles, moles that have irregular borders, moles that are larger than a pencil eraser, or moles that have changed in shape or color.

## 2016-11-03 NOTE — Progress Notes (Signed)
PCP notes:   Health maintenance:  Foot exam - PCP will address at next appt Mammogram - PCP will address at next appt Bone density - PCP will address at next appt Tetanus - addressed; postponed/insurance A1C - completed Eye exam - per pt, exam in April 2018  Abnormal screenings:   None  Patient concerns:   Pt has requested a referral for a carotid ultrasound and a referral to cardiology.   Nurse concerns:  None  Next PCP appt:   11/07/16 @ 1600

## 2016-11-03 NOTE — Telephone Encounter (Signed)
Previously routed to PCP

## 2016-11-03 NOTE — Progress Notes (Signed)
Subjective:   Alexis Tucker is a 76 y.o. female who presents for Medicare Annual (Subsequent) preventive examination.  Review of Systems:  N/A Cardiac Risk Factors include: advanced age (>46men, >56 women);obesity (BMI >30kg/m2);diabetes mellitus;hypertension     Objective:     Vitals: BP (!) 142/82 (BP Location: Right Arm, Patient Position: Sitting, Cuff Size: Normal)   Pulse 76   Temp 97.5 F (36.4 C) (Oral)   Ht 5\' 3"  (1.6 m) Comment: no shoes  Wt 204 lb (92.5 kg)   SpO2 99%   BMI 36.14 kg/m   Body mass index is 36.14 kg/m.   Tobacco History  Smoking Status  . Former Smoker  Smokeless Tobacco  . Never Used    Comment: quit over 40 yrs ago     Counseling given: No   Past Medical History:  Diagnosis Date  . Anemia    per prior pcp  . Carotid stenosis, asymptomatic 01/2014   mild ICA, severe in ECA, rec rpt 03/2014 2 years  . Cataracts, bilateral   . CKD (chronic kidney disease) stage 4, GFR 15-29 ml/min (HCC) 01/2012   Cr 3.85, in 3s since 2010, discussing HD and L forearm graft (Singh/Schnier)  . Diabetes type 2, controlled (HCC) 2000s  . Diabetic retinopathy with macular edema (HCC) 03/2014   [place] at [facility] eye on avastin  . DJD (degenerative joint disease)    per prior pcp  . GERD (gastroesophageal reflux disease)   . Glaucoma 03/2014  . History of breast cancer 2008   infiltrating ductal carcinoma s/p mammosite  . History of colon cancer 2006   s/p colectomy  . History of hepatitis A 1990s   from food, resolved  . HTN (hypertension)   . Macular degeneration    wet R with vision loss, dry L; to see retina specialist  . Neuropathy    per prior pcp  . OA (osteoarthritis)    knees and hips  . Osteonecrosis (HCC)    hips?  2007 PONV (postoperative nausea and vomiting)   . Post-surgical hypothyroidism   . Wears dentures    partial upper   Past Surgical History:  Procedure Laterality Date  . ABDOMINAL HYSTERECTOMY  1976   after tubal pregnancy, h/o  fibroids with heavy bleeding  . BREAST MAMMOSITE  2008  . carotid ultrasound  12/2012   1-39% bilateral stenosis, severe in ECA  . CATARACT EXTRACTION W/PHACO Left 08/14/2016   Procedure: CATARACT EXTRACTION PHACO AND INTRAOCULAR LENS PLACEMENT (IOC)  Left Diabetic;  Surgeon: 10/12/2016, MD;  Location[facility];  Service: Ophthalmology;  Laterality: Left;  Diabetic - oral meds  . CHOLECYSTECTOMY  2005  . COLECTOMY  2005   colon cancer  . COLON SURGERY  2006   bowel obstruction  . COLONOSCOPY  2008   ext hem, ileo-colonic anastomosis in cecum, tubular adenoma x1, rec rpt 1 yr for multiple polyps (in [place])  . ESOPHAGOGASTRODUODENOSCOPY N/A 05/06/2016   Procedure: ESOPHAGOGASTRODUODENOSCOPY (EGD);  Surgeon: 13/10/2015, MD;  [facility];  Service: Endoscopy;  Laterality: N/A;  . THYROIDECTOMY, PARTIAL     unclear why  . TOTAL KNEE ARTHROPLASTY Right 1999  . [facility] ECHOCARD[facility]  11/2007   nl LV fxn EF 60%, diastolic dysfunction, LVH, tr TR and MR, slcerotic aortic valve   Family History  Problem Relation Age of Onset  . Diabetes Mother   . Hypertension Mother   . Arthritis Mother   . Alcohol abuse Father   .  Arthritis Father   . Stroke Daughter   . CAD Neg Hx   . Cancer Neg Hx    History  Sexual Activity  . Sexual activity: Not on file    Outpatient Encounter Prescriptions as of 11/02/2016  Medication Sig  . acetaminophen (TYLENOL) 325 MG tablet Take 2 tablets (650 mg total) by mouth every 6 (six) hours as needed for mild pain (or Fever >/= 101).  . Acetylcysteine 600 MG CAPS Take by mouth daily.  Marland Kitchen allopurinol (ZYLOPRIM) 100 MG tablet TAKE 1 TABLET BY MOUTH DAILY  . anastrozole (ARIMIDEX) 1 MG tablet TAKE 1 TABLET BY MOUTH DAILY  . ARMOUR THYROID 60 MG tablet TAKE 1 TABLET BY MOUTH DAILY  . aspirin EC 81 MG EC tablet Take 1 tablet (81 mg total) by mouth daily.  . Blood Glucose Monitoring Suppl (ACURA BLOOD GLUCOSE METER) W/DEVICE KIT Use as  directed to check sugars daily 250.42  . Calcium-Magnesium-Vitamin D (CALCIUM MAGNESIUM PO) Take 1 tablet by mouth daily.  . carvedilol (COREG) 25 MG tablet TAKE 1 TABLET BY MOUTH DAILY  . Cholecalciferol (VITAMIN D-3) 5000 UNITS TABS Take 1 tablet by mouth daily.  Marland Kitchen FERROUS GLUCONATE PO Take 28 mg by mouth daily.  . Folic Acid-Vit V2-ZDG U44 (FOLBEE) 2.5-25-1 MG TABS tablet Take 1 tablet by mouth daily.  . hydrALAZINE (APRESOLINE) 50 MG tablet Take 1 tablet (50 mg total) by mouth 3 (three) times daily.  . isosorbide dinitrate (ISORDIL) 20 MG tablet TAKE 1 TABLET BY MOUTH DAILY  . latanoprost (XALATAN) 0.005 % ophthalmic solution Place 1 drop into both eyes at bedtime.  . ONGLYZA 5 MG TABS tablet TAKE 1 TABLET BY MOUTH ONCE A DAY *NEED OFFICE VISIT  . pantoprazole (PROTONIX) 40 MG tablet Take 1 tablet (40 mg total) by mouth 2 (two) times daily.  . pentoxifylline (TRENTAL) 400 MG CR tablet TAKE 1 TABLET BY MOUTH DAILY  . promethazine (PHENERGAN) 25 MG tablet Take 1 tablet (25 mg total) by mouth every 6 (six) hours as needed for nausea or vomiting.   No facility-administered encounter medications on file as of 11/02/2016.     Activities of Daily Living In your present state of health, do you have any difficulty performing the following activities: 11/02/2016 08/14/2016  Hearing? N N  Vision? Y N  Difficulty concentrating or making decisions? N Y  Walking or climbing stairs? N N  Dressing or bathing? N N  Doing errands, shopping? Y -  Conservation officer, nature and eating ? N -  Using the Toilet? N -  In the past six months, have you accidently leaked urine? N -  Do you have problems with loss of bowel control? N -  Managing your Medications? N -  Managing your Finances? N -  Housekeeping or managing your Housekeeping? N -  Some recent data might be hidden    Patient Care Team: Ria Bush, MD as PCP - General (Family Medicine)    Assessment:     Hearing Screening   '125Hz'$  '250Hz'$  '500Hz'$  '1000Hz'$   '2000Hz'$  '3000Hz'$  '4000Hz'$  '6000Hz'$  '8000Hz'$   Right ear:   40 40 40  40    Left ear:   40 40 40  40    Vision Screening Comments: Last vision in April 2018   Exercise Activities and Dietary recommendations Current Exercise Habits: The patient does not participate in regular exercise at present, Exercise limited by: orthopedic condition(s)  Goals    . safety  Starting 11/02/2016, I will continue to use cane as needed to reduce risk of falls.       Fall Risk Fall Risk  11/02/2016 07/27/2015 03/13/2014 01/07/2013  Falls in the past year? No No No Yes  Risk for fall due to : - - - Other (Comment)   Depression Screen PHQ 2/9 Scores 11/02/2016 07/27/2015 03/13/2014 01/07/2013  PHQ - 2 Score 0 0 0 0     Cognitive Function MMSE - Mini Mental State Exam 11/02/2016  Orientation to time 5  Orientation to Place 5  Registration 3  Attention/ Calculation 0  Recall 3  Language- name 2 objects 0  Language- repeat 1  Language- follow 3 step command 3  Language- read & follow direction 0  Write a sentence 0  Copy design 0  Total score 20     PLEASE NOTE: A Mini-Cog screen was completed. Maximum score is 20. A value of 0 denotes this part of Folstein MMSE was not completed or the patient failed this part of the Mini-Cog screening.   Mini-Cog Screening Orientation to Time - Max 5 pts Orientation to Place - Max 5 pts Registration - Max 3 pts Recall - Max 3 pts Language Repeat - Max 1 pts Language Follow 3 Step Command - Max 3 pts     Immunization History  Administered Date(s) Administered  . Influenza,inj,Quad PF,36+ Mos 03/13/2014, 07/27/2015  . Pneumococcal Conjugate-13 03/13/2014  . Pneumococcal Polysaccharide-23 07/03/2009   Screening Tests Health Maintenance  Topic Date Due  . FOOT EXAM  11/07/2016 (Originally 03/16/1951)  . MAMMOGRAM  11/02/2017 (Originally 02/04/2014)  . DEXA SCAN  11/02/2017 (Originally 03/16/2008)  . DTaP/Tdap/Td (1 - Tdap) 11/03/2026 (Originally 03/16/1960)  .  TETANUS/TDAP  11/03/2026 (Originally 03/16/1960)  . COLONOSCOPY  01/27/2017  . INFLUENZA VACCINE  01/31/2017  . HEMOGLOBIN A1C  05/05/2017  . OPHTHALMOLOGY EXAM  10/20/2017  . PNA vac Low Risk Adult  Completed      Plan:    I have personally reviewed and addressed the Medicare Annual Wellness questionnaire and have noted the following in the patient's chart:  A. Medical and social history B. Use of alcohol, tobacco or illicit drugs  C. Current medications and supplements D. Functional ability and status E.  Nutritional status F.  Physical activity G. Advance directives H. List of other physicians I.  Hospitalizations, surgeries, and ER visits in previous 12 months J.  [place] to include hearing, vision, cognitive, depression L. Referrals and appointments - none  In addition, I have reviewed and discussed with patient certain preventive protocols, quality metrics, and best practice recommendations. A written personalized care plan for preventive services as well as general preventive health recommendations were provided to patient.  See attached scanned questionnaire for additional information.   Signed,   Lindell Noe, MHA, BS, LPN Health Coach

## 2016-11-03 NOTE — Telephone Encounter (Signed)
Noted  

## 2016-11-04 NOTE — Progress Notes (Signed)
I reviewed health advisor's note, was available for consultation, and agree with documentation and plan.  

## 2016-11-07 ENCOUNTER — Ambulatory Visit (INDEPENDENT_AMBULATORY_CARE_PROVIDER_SITE_OTHER): Payer: Medicare Other | Admitting: Family Medicine

## 2016-11-07 ENCOUNTER — Encounter: Payer: Self-pay | Admitting: Family Medicine

## 2016-11-07 ENCOUNTER — Other Ambulatory Visit: Payer: Self-pay | Admitting: Family Medicine

## 2016-11-07 VITALS — BP 146/82 | HR 74 | Temp 97.9°F | Ht 63.0 in | Wt 207.2 lb

## 2016-11-07 DIAGNOSIS — H3530 Unspecified macular degeneration: Secondary | ICD-10-CM | POA: Diagnosis not present

## 2016-11-07 DIAGNOSIS — N185 Chronic kidney disease, stage 5: Secondary | ICD-10-CM

## 2016-11-07 DIAGNOSIS — E1165 Type 2 diabetes mellitus with hyperglycemia: Secondary | ICD-10-CM

## 2016-11-07 DIAGNOSIS — I6523 Occlusion and stenosis of bilateral carotid arteries: Secondary | ICD-10-CM

## 2016-11-07 DIAGNOSIS — K219 Gastro-esophageal reflux disease without esophagitis: Secondary | ICD-10-CM | POA: Diagnosis not present

## 2016-11-07 DIAGNOSIS — I480 Paroxysmal atrial fibrillation: Secondary | ICD-10-CM

## 2016-11-07 DIAGNOSIS — E1122 Type 2 diabetes mellitus with diabetic chronic kidney disease: Secondary | ICD-10-CM | POA: Diagnosis not present

## 2016-11-07 DIAGNOSIS — I10 Essential (primary) hypertension: Secondary | ICD-10-CM

## 2016-11-07 DIAGNOSIS — Z1231 Encounter for screening mammogram for malignant neoplasm of breast: Secondary | ICD-10-CM | POA: Diagnosis not present

## 2016-11-07 DIAGNOSIS — E890 Postprocedural hypothyroidism: Secondary | ICD-10-CM | POA: Diagnosis not present

## 2016-11-07 DIAGNOSIS — Z748 Other problems related to care provider dependency: Secondary | ICD-10-CM

## 2016-11-07 DIAGNOSIS — E2839 Other primary ovarian failure: Secondary | ICD-10-CM

## 2016-11-07 DIAGNOSIS — Z853 Personal history of malignant neoplasm of breast: Secondary | ICD-10-CM | POA: Diagnosis not present

## 2016-11-07 DIAGNOSIS — IMO0002 Reserved for concepts with insufficient information to code with codable children: Secondary | ICD-10-CM

## 2016-11-07 DIAGNOSIS — Z1239 Encounter for other screening for malignant neoplasm of breast: Secondary | ICD-10-CM

## 2016-11-07 DIAGNOSIS — E11311 Type 2 diabetes mellitus with unspecified diabetic retinopathy with macular edema: Secondary | ICD-10-CM

## 2016-11-07 MED ORDER — FERROUS SULFATE DRIED ER 160 (50 FE) MG PO TBCR: 1.0000 | EXTENDED_RELEASE_TABLET | Freq: Every day | ORAL | 3 refills | Status: DC

## 2016-11-07 NOTE — Progress Notes (Signed)
Pre visit review using our clinic review tool, if applicable. No additional management support is needed unless otherwise documented below in the visit note. 

## 2016-11-07 NOTE — Patient Instructions (Addendum)
We will place referral for mammogram and bone density scan to get at same time as your daughter.  We will refer you for carotid ultrasounds.  If interested in shigrix 2 shot series (new shingles shot), check with pharmacy.  Try slow release iron formulation.  Sign release for latest office note of Dr Candiss Norse (kidney doctor) Return in 6 months for folllow up visit.   Health Maintenance, Female Adopting a healthy lifestyle and getting preventive care can go a long way to promote health and wellness. Talk with your health care provider about what schedule of regular examinations is right for you. This is a good chance for you to check in with your provider about disease prevention and staying healthy. In between checkups, there are plenty of things you can do on your own. Experts have done a lot of research about which lifestyle changes and preventive measures are most likely to keep you healthy. Ask your health care provider for more information. Weight and diet Eat a healthy diet  Be sure to include plenty of vegetables, fruits, low-fat dairy products, and lean protein.  Do not eat a lot of foods high in solid fats, added sugars, or salt.  Get regular exercise. This is one of the most important things you can do for your health.  Most adults should exercise for at least 150 minutes each week. The exercise should increase your heart rate and make you sweat (moderate-intensity exercise).  Most adults should also do strengthening exercises at least twice a week. This is in addition to the moderate-intensity exercise. Maintain a healthy weight  Body mass index (BMI) is a measurement that can be used to identify possible weight problems. It estimates body fat based on height and weight. Your health care provider can help determine your BMI and help you achieve or maintain a healthy weight.  For females 54 years of age and older:  A BMI below 18.5 is considered underweight.  A BMI of 18.5 to  24.9 is normal.  A BMI of 25 to 29.9 is considered overweight.  A BMI of 30 and above is considered obese. Watch levels of cholesterol and blood lipids  You should start having your blood tested for lipids and cholesterol at 73 years of age, then have this test every 5 years.  You may need to have your cholesterol levels checked more often if:  Your lipid or cholesterol levels are high.  You are older than 74 years of age.  You are at high risk for heart disease. Cancer screening Lung Cancer  Lung cancer screening is recommended for adults 20-73 years old who are at high risk for lung cancer because of a history of smoking.  A yearly low-dose CT scan of the lungs is recommended for people who:  Currently smoke.  Have quit within the past 15 years.  Have at least a 30-pack-year history of smoking. A pack year is smoking an average of one pack of cigarettes a day for 1 year.  Yearly screening should continue until it has been 15 years since you quit.  Yearly screening should stop if you develop a health problem that would prevent you from having lung cancer treatment. Breast Cancer  Practice breast self-awareness. This means understanding how your breasts normally appear and feel.  It also means doing regular breast self-exams. Let your health care provider know about any changes, no matter how small.  If you are in your 20s or 30s, you should have a clinical breast  exam (CBE) by a health care provider every 1-3 years as part of a regular health exam.  If you are 56 or older, have a CBE every year. Also consider having a breast X-ray (mammogram) every year.  If you have a family history of breast cancer, talk to your health care provider about genetic screening.  If you are at high risk for breast cancer, talk to your health care provider about having an MRI and a mammogram every year.  Breast cancer gene (BRCA) assessment is recommended for women who have family members  with BRCA-related cancers. BRCA-related cancers include:  Breast.  Ovarian.  Tubal.  Peritoneal cancers.  Results of the assessment will determine the need for genetic counseling and BRCA1 and BRCA2 testing. Cervical Cancer  Your health care provider may recommend that you be screened regularly for cancer of the pelvic organs (ovaries, uterus, and vagina). This screening involves a pelvic examination, including checking for microscopic changes to the surface of your cervix (Pap test). You may be encouraged to have this screening done every 3 years, beginning at age 99.  For women ages 41-65, health care providers may recommend pelvic exams and Pap testing every 3 years, or they may recommend the Pap and pelvic exam, combined with testing for human papilloma virus (HPV), every 5 years. Some types of HPV increase your risk of cervical cancer. Testing for HPV may also be done on women of any age with unclear Pap test results.  Other health care providers may not recommend any screening for nonpregnant women who are considered low risk for pelvic cancer and who do not have symptoms. Ask your health care provider if a screening pelvic exam is right for you.  If you have had past treatment for cervical cancer or a condition that could lead to cancer, you need Pap tests and screening for cancer for at least 20 years after your treatment. If Pap tests have been discontinued, your risk factors (such as having a new sexual partner) need to be reassessed to determine if screening should resume. Some women have medical problems that increase the chance of getting cervical cancer. In these cases, your health care provider may recommend more frequent screening and Pap tests. Colorectal Cancer  This type of cancer can be detected and often prevented.  Routine colorectal cancer screening usually begins at 74 years of age and continues through 74 years of age.  Your health care provider may recommend  screening at an earlier age if you have risk factors for colon cancer.  Your health care provider may also recommend using home test kits to check for hidden blood in the stool.  A small camera at the end of a tube can be used to examine your colon directly (sigmoidoscopy or colonoscopy). This is done to check for the earliest forms of colorectal cancer.  Routine screening usually begins at age 33.  Direct examination of the colon should be repeated every 5-10 years through 74 years of age. However, you may need to be screened more often if early forms of precancerous polyps or small growths are found. Skin Cancer  Check your skin from head to toe regularly.  Tell your health care provider about any new moles or changes in moles, especially if there is a change in a mole's shape or color.  Also tell your health care provider if you have a mole that is larger than the size of a pencil eraser.  Always use sunscreen. Apply sunscreen liberally and  repeatedly throughout the day.  Protect yourself by wearing long sleeves, pants, a wide-brimmed hat, and sunglasses whenever you are outside. Heart disease, diabetes, and high blood pressure  High blood pressure causes heart disease and increases the risk of stroke. High blood pressure is more likely to develop in:  People who have blood pressure in the high end of the normal range (130-139/85-89 mm Hg).  People who are overweight or obese.  People who are African American.  If you are 74-13 years of age, have your blood pressure checked every 3-5 years. If you are 42 years of age or older, have your blood pressure checked every year. You should have your blood pressure measured twice-once when you are at a hospital or clinic, and once when you are not at a hospital or clinic. Record the average of the two measurements. To check your blood pressure when you are not at a hospital or clinic, you can use:  An automated blood pressure machine at a  pharmacy.  A home blood pressure monitor.  If you are between 22 years and 15 years old, ask your health care provider if you should take aspirin to prevent strokes.  Have regular diabetes screenings. This involves taking a blood sample to check your fasting blood sugar level.  If you are at a normal weight and have a low risk for diabetes, have this test once every three years after 74 years of age.  If you are overweight and have a high risk for diabetes, consider being tested at a younger age or more often. Preventing infection Hepatitis B  If you have a higher risk for hepatitis B, you should be screened for this virus. You are considered at high risk for hepatitis B if:  You were born in a country where hepatitis B is common. Ask your health care provider which countries are considered high risk.  Your parents were born in a high-risk country, and you have not been immunized against hepatitis B (hepatitis B vaccine).  You have HIV or AIDS.  You use needles to inject street drugs.  You live with someone who has hepatitis B.  You have had sex with someone who has hepatitis B.  You get hemodialysis treatment.  You take certain medicines for conditions, including cancer, organ transplantation, and autoimmune conditions. Hepatitis C  Blood testing is recommended for:  Everyone born from [birth year] through 1963.  Anyone with known risk factors for hepatitis C. Sexually transmitted infections (STIs)  You should be screened for sexually transmitted infections (STIs) including gonorrhea and chlamydia if:  You are sexually active and are younger than 74 years of age.  You are older than 74 years of age and your health care provider tells you that you are at risk for this type of infection.  Your sexual activity has changed since you were last screened and you are at an increased risk for chlamydia or gonorrhea. Ask your health care provider if you are at risk.  If you do not have  HIV, but are at risk, it may be recommended that you take a prescription medicine daily to prevent HIV infection. This is called pre-exposure prophylaxis (PrEP). You are considered at risk if:  You are sexually active and do not regularly use condoms or know the HIV status of your partner(s).  You take drugs by injection.  You are sexually active with a partner who has HIV. Talk with your health care provider about whether you are at high risk  of being infected with HIV. If you choose to begin PrEP, you should first be tested for HIV. You should then be tested every 3 months for as long as you are taking PrEP. Pregnancy  If you are premenopausal and you may become pregnant, ask your health care provider about preconception counseling.  If you may become pregnant, take 400 to 800 micrograms (mcg) of folic acid every day.  If you want to prevent pregnancy, talk to your health care provider about birth control (contraception). Osteoporosis and menopause  Osteoporosis is a disease in which the bones lose minerals and strength with aging. This can result in serious bone fractures. Your risk for osteoporosis can be identified using a bone density scan.  If you are 35 years of age or older, or if you are at risk for osteoporosis and fractures, ask your health care provider if you should be screened.  Ask your health care provider whether you should take a calcium or vitamin D supplement to lower your risk for osteoporosis.  Menopause may have certain physical symptoms and risks.  Hormone replacement therapy may reduce some of these symptoms and risks. Talk to your health care provider about whether hormone replacement therapy is right for you. Follow these instructions at home:  Schedule regular health, dental, and eye exams.  Stay current with your immunizations.  Do not use any tobacco products including cigarettes, chewing tobacco, or electronic cigarettes.  If you are pregnant, do not  drink alcohol.  If you are breastfeeding, limit how much and how often you drink alcohol.  Limit alcohol intake to no more than 1 drink per day for nonpregnant women. One drink equals 12 ounces of beer, 5 ounces of wine, or 1 ounces of hard liquor.  Do not use street drugs.  Do not share needles.  Ask your health care provider for help if you need support or information about quitting drugs.  Tell your health care provider if you often feel depressed.  Tell your health care provider if you have ever been abused or do not feel safe at home. This information is not intended to replace advice given to you by your health care provider. Make sure you discuss any questions you have with your health care provider. Document Released: 01/02/2011 Document Revised: 11/25/2015 Document Reviewed: 03/23/2015 Elsevier Interactive Patient Education  2017 [employer].

## 2016-11-07 NOTE — Assessment & Plan Note (Signed)
A bit irregular today but largely maintaining regular rhythm, not on anticoagulation. Check EKG next visit.

## 2016-11-07 NOTE — Assessment & Plan Note (Signed)
Longstanding financial and transportation barriers, did not return our phone calls last year for assistance. Now living with daughter anticipate easier transportation.

## 2016-11-07 NOTE — Assessment & Plan Note (Signed)
Chronic, mildly elevated. No changes today. Continue current regimen.

## 2016-11-07 NOTE — Assessment & Plan Note (Signed)
TSH slightly elevated - continue armour thyroid. Recheck next visit along with FT4/T3

## 2016-11-07 NOTE — Progress Notes (Addendum)
BP (!) 146/82   Pulse 74   Temp 97.9 F (36.6 C) (Oral)   Ht '5\' 3"'$  (1.6 m)   Wt 207 lb 4 oz (94 kg)   SpO2 99%   BMI 36.71 kg/m    CC: AMW f/u visit Subjective:    Patient ID: Alexis Tucker, female    DOB: [birth year], 76 y.o.   MRN: [id number]  HPI: Alexis Tucker is a 76 y.o. female presenting on 11/07/2016 for Annual Exam (Medicare pt2)   I last saw patient 07/2015. Previous transportation issues but now improved as she has moved in with daughter Ione [place]). Upcoming cruise.   Saw Lesia last week for medicare wellness visit. Note reviewed.  States she sees nephrology Q6 mo (Dr Candiss Norse) ==> ADDENDUM received and reviewed last renal note dated 09/2015.   Vision loss this past year - macular edema after cataract surgery.   GERD - takes PPI PRN GERD.   Preventative: Well woman was 2008 (OBGYN at [facility]). s/p hysterectomy 1976.  Mammogram 01/2013 normal [facility]. H/o breast cancer 2008, currently on arimidex. Unclear when to stop. Pt states she does breast exams at home and declines in office today.  COLONOSCOPY Date: 2008 ext hem, ileo-colonic anastomosis in cecum, tubular adenoma x1, rec rpt 1 yr for multiple polyps (done in [place]). H/o colon cancer 2005 s/p colectomy. She never returned for this. Never returned iFOB last year. She will consider return to GI.  Dexa scan - has had, thinks about 10 yrs ago. No records. Will order DEXA.  Flu shot yearly Pneumovax 2011, prevnar 2015 Tetanus - unsure, thinks ~2013 Shingrix - declines Advanced directives/living will - wants grand daughter and daughter to be HCPOA. Asked to bring me a copy Seat belt use discussed. Sunscreen [place] discussed. No changing moles on skin. Non smoker Alcohol - 1 glass of wine seldom  Lives with daughter Alexis Tucker, 1 dog. Granddaughter in [place].  Divorced  Occu: retired, worked in Engineer, technical sales  Edu: college  Relevant past medical, surgical, family and social history reviewed and updated as indicated. Interim  medical history since our last visit reviewed. Allergies and medications reviewed and updated. Outpatient Medications Prior to Visit  Medication Sig Dispense Refill  . acetaminophen (TYLENOL) 325 MG tablet Take 2 tablets (650 mg total) by mouth every 6 (six) hours as needed for mild pain (or Fever >/= 101). 30 tablet 0  . Acetylcysteine 600 MG CAPS Take by mouth daily.    Marland Kitchen allopurinol (ZYLOPRIM) 100 MG tablet Take 1 tablet (100 mg total) by mouth daily. 90 tablet 2  . anastrozole (ARIMIDEX) 1 MG tablet TAKE 1 TABLET BY MOUTH DAILY 30 tablet 0  . ARMOUR THYROID 60 MG tablet TAKE 1 TABLET BY MOUTH DAILY 30 tablet 0  . aspirin EC 81 MG EC tablet Take 1 tablet (81 mg total) by mouth daily. 30 tablet 12  . Blood Glucose Monitoring Suppl (ACURA BLOOD GLUCOSE METER) W/DEVICE KIT Use as directed to check sugars daily 250.42 1 kit 0  . Calcium-Magnesium-Vitamin D (CALCIUM MAGNESIUM PO) Take 1 tablet by mouth daily.    . carvedilol (COREG) 25 MG tablet TAKE 1 TABLET BY MOUTH DAILY 90 tablet 2  . Cholecalciferol (VITAMIN D-3) 5000 UNITS TABS Take 1 tablet by mouth daily.    . Folic Acid-Vit I1-WER X54 (FOLBEE) 2.5-25-1 MG TABS tablet Take 1 tablet by mouth daily.    . hydrALAZINE (APRESOLINE) 50 MG tablet Take 1 tablet (50 mg total) by mouth 3 (three)  times daily. 90 tablet 2  . isosorbide dinitrate (ISORDIL) 20 MG tablet Take 1 tablet (20 mg total) by mouth daily. 90 tablet 2  . latanoprost (XALATAN) 0.005 % ophthalmic solution Place 1 drop into both eyes at bedtime.    . ONGLYZA 5 MG TABS tablet TAKE 1 TABLET BY MOUTH ONCE A DAY *NEED OFFICE VISIT 30 tablet 0  . pentoxifylline (TRENTAL) 400 MG CR tablet TAKE 1 TABLET BY MOUTH DAILY 30 tablet 0  . FERROUS GLUCONATE PO Take 28 mg by mouth daily.    . pantoprazole (PROTONIX) 40 MG tablet Take 1 tablet (40 mg total) by mouth 2 (two) times daily. (Patient not taking: Reported on 11/07/2016) 60 tablet 2  . promethazine (PHENERGAN) 25 MG tablet Take 1 tablet  (25 mg total) by mouth every 6 (six) hours as needed for nausea or vomiting. (Patient not taking: Reported on 11/07/2016) 30 tablet 0   No facility-administered medications prior to visit.      Per HPI unless specifically indicated in ROS section below Review of Systems     Objective:    BP (!) 146/82   Pulse 74   Temp 97.9 F (36.6 C) (Oral)   Ht '5\' 3"'$  (1.6 m)   Wt 207 lb 4 oz (94 kg)   SpO2 99%   BMI 36.71 kg/m   Wt Readings from Last 3 Encounters:  11/07/16 207 lb 4 oz (94 kg)  11/02/16 204 lb (92.5 kg)  08/14/16 209 lb (94.8 kg)    Physical Exam  Constitutional: She is oriented to person, place, and time. She appears well-developed and well-nourished. No distress.  HENT:  Head: Normocephalic and atraumatic.  Mouth/Throat: Oropharynx is clear and moist. No oropharyngeal exudate.  Eyes: Conjunctivae and EOM are normal. Pupils are equal, round, and reactive to light. No scleral icterus.  Neck: Normal range of motion. Neck supple. Carotid bruit is present (bilat). No thyromegaly present.  Cardiovascular: Normal rate, normal heart sounds and intact distal pulses.  An irregular rhythm present.  No murmur heard. Pulmonary/Chest: Effort normal and breath sounds normal. No respiratory distress. She has no wheezes. She has no rales.  Abdominal: Soft. Bowel sounds are normal. She exhibits no distension and no mass. There is no tenderness. There is no rebound and no guarding.  Musculoskeletal: She exhibits no edema.  Lymphadenopathy:    She has no cervical adenopathy.  Neurological: She is alert and oriented to person, place, and time.  Skin: Skin is warm and dry. No rash noted.  Psychiatric: She has a normal mood and affect.  Nursing note and vitals reviewed.  Results for orders placed or performed in visit on 11/02/16  Lipid panel  Result Value Ref Range   Cholesterol 183 0 - 200 mg/dL   Triglycerides 83.0 0.0 - 149.0 mg/dL   HDL 65.40 >39.00 mg/dL   VLDL 16.6 0.0 - 40.0  mg/dL   LDL Cholesterol 101 (H) 0 - 99 mg/dL   Total CHOL/HDL Ratio 3    NonHDL 117.84   Comprehensive metabolic panel  Result Value Ref Range   Sodium 143 135 - 145 mEq/L   Potassium 4.4 3.5 - 5.1 mEq/L   Chloride 112 96 - 112 mEq/L   CO2 20 19 - 32 mEq/L   Glucose, Bld 115 (H) 70 - 99 mg/dL   BUN 31 (H) 6 - 23 mg/dL   Creatinine, Ser 4.03 (H) 0.40 - 1.20 mg/dL   Total Bilirubin 0.5 0.2 - 1.2 mg/dL  Alkaline Phosphatase 76 39 - 117 U/L   AST 14 0 - 37 U/L   ALT 10 0 - 35 U/L   Total Protein 7.6 6.0 - 8.3 g/dL   Albumin 4.3 3.5 - 5.2 g/dL   Calcium 9.4 8.4 - 10.5 mg/dL   GFR 13.97 (LL) >60.00 mL/min  TSH  Result Value Ref Range   TSH 4.90 (H) 0.35 - 4.50 uIU/mL  Hemoglobin A1c  Result Value Ref Range   Hgb A1c MFr Bld 6.5 4.6 - 6.5 %      Assessment & Plan:   Problem List Items Addressed This Visit    Assistance needed with transportation    Longstanding financial and transportation barriers, did not return our phone calls last year for assistance. Now living with daughter anticipate easier transportation.       Carotid stenosis, asymptomatic    Severe ECA, mild ICA stenosis. Update carotid US.       Relevant Orders   VAS US CAROTID   Chronic kidney disease (CKD), stage V ([place])    Appreciate renal care. Pt states she sees renal Q6 mo. I have requested latest note from Dr Candiss Norse.      Diabetic retinopathy with macular edema (HCC)   GERD (gastroesophageal reflux disease)    Treats with PRN PPI, diet related.      History of breast cancer    Long overdue. Ordered screening mammogram.       Relevant Orders   MM Digital Screening   HTN (hypertension)    Chronic, mildly elevated. No changes today. Continue current regimen.       Macular degeneration    Progressive vision loss - sees retina specialist.      PAF (paroxysmal atrial fibrillation) (HCC)    A bit irregular today but largely maintaining regular rhythm, not on anticoagulation. Check EKG next  visit.      Post-surgical hypothyroidism    TSH slightly elevated - continue armour thyroid. Recheck next visit along with FT4/T3      Type 2 diabetes mellitus, uncontrolled, with renal complications (HCC) - Primary    A1c wonderful control - will check fructosamine next labs given stage 5 CKD. Continue onglyza.        Other Visit Diagnoses    Breast cancer screening       Relevant Orders   MM Digital Screening   Estrogen deficiency       Relevant Orders   DG Bone Density       Follow up plan: Return in about 6 months (around 05/10/2017) for follow up visit.  Ria Bush, MD

## 2016-11-07 NOTE — Assessment & Plan Note (Addendum)
Long overdue. Ordered screening mammogram.

## 2016-11-07 NOTE — Assessment & Plan Note (Signed)
Progressive vision loss - sees retina specialist.

## 2016-11-07 NOTE — Assessment & Plan Note (Signed)
Severe ECA, mild ICA stenosis. Update carotid US.

## 2016-11-07 NOTE — Assessment & Plan Note (Signed)
Treats with PRN PPI, diet related.

## 2016-11-07 NOTE — Assessment & Plan Note (Signed)
Appreciate renal care. Pt states she sees renal Q6 mo. I have requested latest note from Dr Candiss Norse.

## 2016-11-07 NOTE — Assessment & Plan Note (Signed)
A1c wonderful control - will check fructosamine next labs given stage 5 CKD. Continue onglyza.

## 2016-11-10 ENCOUNTER — Telehealth: Payer: Self-pay | Admitting: *Deleted

## 2016-11-10 DIAGNOSIS — E113312 Type 2 diabetes mellitus with moderate nonproliferative diabetic retinopathy with macular edema, left eye: Secondary | ICD-10-CM | POA: Diagnosis not present

## 2016-11-10 NOTE — Telephone Encounter (Signed)
OV note faxed to Dr Candiss Norse at [phone] as requested.

## 2016-11-10 NOTE — Telephone Encounter (Signed)
-----   Message from Ria Bush, MD sent at 11/10/2016  7:58 AM EDT ----- plz fax latest note attn Dr Candiss Norse nephrology Thank you, Garlon Hatchet

## 2016-11-22 ENCOUNTER — Other Ambulatory Visit: Payer: Self-pay | Admitting: Family Medicine

## 2016-11-22 DIAGNOSIS — E113312 Type 2 diabetes mellitus with moderate nonproliferative diabetic retinopathy with macular edema, left eye: Secondary | ICD-10-CM | POA: Diagnosis not present

## 2016-11-23 ENCOUNTER — Telehealth: Payer: Self-pay | Admitting: Cardiovascular Disease

## 2016-11-23 ENCOUNTER — Other Ambulatory Visit: Payer: Self-pay | Admitting: Family Medicine

## 2016-11-23 NOTE — Telephone Encounter (Signed)
Pt calling stating she took one pill of Clonidine  And its really effected her breathing She also read that people with kidney diease should not be taking this Would like some advise on this She is not SOB over the phone  She states her BP was "up agan" today Would like to see if she can try something else  Please call back

## 2016-11-23 NOTE — Telephone Encounter (Signed)
No answer. Left message to call back.   

## 2016-11-29 NOTE — Telephone Encounter (Signed)
Left voicemail message to call back  

## 2016-11-30 NOTE — Telephone Encounter (Signed)
No answer. No voicemail. 

## 2016-11-30 NOTE — Telephone Encounter (Signed)
Left voicemail message to call back  

## 2016-11-30 NOTE — Telephone Encounter (Signed)
Left voicemail message per release form.

## 2016-12-25 ENCOUNTER — Telehealth: Payer: Self-pay | Admitting: Family Medicine

## 2016-12-25 NOTE — Telephone Encounter (Signed)
There is already order in system.

## 2016-12-25 NOTE — Telephone Encounter (Signed)
-----   Message from Virgina Organ sent at 12/25/2016 12:31 PM EDT ----- Regarding: Carotid US Orders needed Good Afternoon Dr. Danise Mina,  Please place orders for Carotid US patient is ready to schedule at [facility].  [phone] best number for patient.     Thank you,  Vaughan Basta

## 2017-01-01 ENCOUNTER — Other Ambulatory Visit: Payer: Self-pay | Admitting: Family Medicine

## 2017-01-02 DIAGNOSIS — Z961 Presence of intraocular lens: Secondary | ICD-10-CM | POA: Diagnosis not present

## 2017-01-02 DIAGNOSIS — H401123 Primary open-angle glaucoma, left eye, severe stage: Secondary | ICD-10-CM | POA: Diagnosis not present

## 2017-01-02 DIAGNOSIS — H59033 Cystoid macular edema following cataract surgery, bilateral: Secondary | ICD-10-CM | POA: Diagnosis not present

## 2017-01-05 ENCOUNTER — Other Ambulatory Visit: Payer: Self-pay | Admitting: *Deleted

## 2017-01-05 MED ORDER — PANTOPRAZOLE SODIUM 40 MG PO TBEC: 40.0000 mg | DELAYED_RELEASE_TABLET | Freq: Every day | ORAL | 1 refills | Status: DC | PRN

## 2017-01-05 MED ORDER — PANTOPRAZOLE SODIUM 40 MG PO TBEC: 40.0000 mg | DELAYED_RELEASE_TABLET | Freq: Every day | ORAL | 3 refills | Status: DC | PRN

## 2017-01-05 NOTE — Telephone Encounter (Signed)
Fax received for medication refill. Chart indicates pt is no longer taking. Last OV 10/2016

## 2017-01-16 DIAGNOSIS — E113213 Type 2 diabetes mellitus with mild nonproliferative diabetic retinopathy with macular edema, bilateral: Secondary | ICD-10-CM | POA: Diagnosis not present

## 2017-01-18 DIAGNOSIS — E113411 Type 2 diabetes mellitus with severe nonproliferative diabetic retinopathy with macular edema, right eye: Secondary | ICD-10-CM | POA: Diagnosis not present

## 2017-01-18 DIAGNOSIS — E113412 Type 2 diabetes mellitus with severe nonproliferative diabetic retinopathy with macular edema, left eye: Secondary | ICD-10-CM | POA: Diagnosis not present

## 2017-01-30 DIAGNOSIS — H401133 Primary open-angle glaucoma, bilateral, severe stage: Secondary | ICD-10-CM | POA: Diagnosis not present

## 2017-02-21 DIAGNOSIS — E113211 Type 2 diabetes mellitus with mild nonproliferative diabetic retinopathy with macular edema, right eye: Secondary | ICD-10-CM | POA: Diagnosis not present

## 2017-02-21 DIAGNOSIS — E113212 Type 2 diabetes mellitus with mild nonproliferative diabetic retinopathy with macular edema, left eye: Secondary | ICD-10-CM | POA: Diagnosis not present

## 2017-03-28 DIAGNOSIS — E113213 Type 2 diabetes mellitus with mild nonproliferative diabetic retinopathy with macular edema, bilateral: Secondary | ICD-10-CM | POA: Diagnosis not present

## 2017-03-28 DIAGNOSIS — H2000 Unspecified acute and subacute iridocyclitis: Secondary | ICD-10-CM | POA: Diagnosis not present

## 2017-04-10 DIAGNOSIS — E113212 Type 2 diabetes mellitus with mild nonproliferative diabetic retinopathy with macular edema, left eye: Secondary | ICD-10-CM | POA: Diagnosis not present

## 2017-04-10 DIAGNOSIS — E113211 Type 2 diabetes mellitus with mild nonproliferative diabetic retinopathy with macular edema, right eye: Secondary | ICD-10-CM | POA: Diagnosis not present

## 2017-05-01 DIAGNOSIS — H401133 Primary open-angle glaucoma, bilateral, severe stage: Secondary | ICD-10-CM | POA: Diagnosis not present

## 2017-05-10 ENCOUNTER — Ambulatory Visit: Payer: Medicare Other | Admitting: Family Medicine

## 2017-05-10 DIAGNOSIS — E113212 Type 2 diabetes mellitus with mild nonproliferative diabetic retinopathy with macular edema, left eye: Secondary | ICD-10-CM | POA: Diagnosis not present

## 2017-05-10 DIAGNOSIS — E113211 Type 2 diabetes mellitus with mild nonproliferative diabetic retinopathy with macular edema, right eye: Secondary | ICD-10-CM | POA: Diagnosis not present

## 2017-05-26 DIAGNOSIS — Z23 Encounter for immunization: Secondary | ICD-10-CM | POA: Diagnosis not present

## 2017-06-07 DIAGNOSIS — E113211 Type 2 diabetes mellitus with mild nonproliferative diabetic retinopathy with macular edema, right eye: Secondary | ICD-10-CM | POA: Diagnosis not present

## 2017-06-07 DIAGNOSIS — E113212 Type 2 diabetes mellitus with mild nonproliferative diabetic retinopathy with macular edema, left eye: Secondary | ICD-10-CM | POA: Diagnosis not present

## 2017-07-03 HISTORY — PX: BREAST LUMPECTOMY: SHX2

## 2017-07-09 ENCOUNTER — Other Ambulatory Visit: Payer: Self-pay | Admitting: Family Medicine

## 2017-07-10 DIAGNOSIS — E113212 Type 2 diabetes mellitus with mild nonproliferative diabetic retinopathy with macular edema, left eye: Secondary | ICD-10-CM | POA: Diagnosis not present

## 2017-07-10 DIAGNOSIS — E113211 Type 2 diabetes mellitus with mild nonproliferative diabetic retinopathy with macular edema, right eye: Secondary | ICD-10-CM | POA: Diagnosis not present

## 2017-08-08 ENCOUNTER — Other Ambulatory Visit: Payer: Self-pay | Admitting: Family Medicine

## 2017-08-09 DIAGNOSIS — E113211 Type 2 diabetes mellitus with mild nonproliferative diabetic retinopathy with macular edema, right eye: Secondary | ICD-10-CM | POA: Diagnosis not present

## 2017-08-09 DIAGNOSIS — E113212 Type 2 diabetes mellitus with mild nonproliferative diabetic retinopathy with macular edema, left eye: Secondary | ICD-10-CM | POA: Diagnosis not present

## 2017-08-13 ENCOUNTER — Telehealth: Payer: Self-pay | Admitting: Family Medicine

## 2017-08-13 NOTE — Telephone Encounter (Signed)
Left message for pt to return call to the office to discuss current symptoms regarding refill request. Medication not previously prescribed by Dr. Darnell Level.

## 2017-08-13 NOTE — Telephone Encounter (Signed)
Copied from [place]. Topic: Quick Communication - Rx Refill/Question >> Aug 10, 2017  1:54 PM Ether Griffins B wrote: Medication: hydrALAZINE   Has the patient contacted their pharmacy? Yes.     (Agent: If no, request that the patient contact the pharmacy for the refill.)   Preferred Pharmacy (with phone number or street name): [id number] - [place], [place]: Please be advised that RX refills may take up to 3 business days. We ask that you follow-up with your pharmacy.

## 2017-08-17 ENCOUNTER — Encounter: Payer: Self-pay | Admitting: Emergency Medicine

## 2017-08-17 ENCOUNTER — Emergency Department
Admission: EM | Admit: 2017-08-17 | Discharge: 2017-08-18 | Disposition: A | Discharge status: Home / Self Care | Payer: Medicare Other | Attending: Emergency Medicine | Admitting: Emergency Medicine

## 2017-08-17 ENCOUNTER — Other Ambulatory Visit: Payer: Self-pay

## 2017-08-17 ENCOUNTER — Emergency Department: Payer: Medicare Other

## 2017-08-17 DIAGNOSIS — Z85038 Personal history of other malignant neoplasm of large intestine: Secondary | ICD-10-CM | POA: Insufficient documentation

## 2017-08-17 DIAGNOSIS — N185 Chronic kidney disease, stage 5: Secondary | ICD-10-CM | POA: Diagnosis not present

## 2017-08-17 DIAGNOSIS — E860 Dehydration: Secondary | ICD-10-CM | POA: Diagnosis not present

## 2017-08-17 DIAGNOSIS — E1122 Type 2 diabetes mellitus with diabetic chronic kidney disease: Secondary | ICD-10-CM | POA: Diagnosis not present

## 2017-08-17 DIAGNOSIS — Z853 Personal history of malignant neoplasm of breast: Secondary | ICD-10-CM | POA: Diagnosis not present

## 2017-08-17 DIAGNOSIS — Z87891 Personal history of nicotine dependence: Secondary | ICD-10-CM | POA: Diagnosis not present

## 2017-08-17 DIAGNOSIS — Z96651 Presence of right artificial knee joint: Secondary | ICD-10-CM | POA: Insufficient documentation

## 2017-08-17 DIAGNOSIS — R42 Dizziness and giddiness: Secondary | ICD-10-CM | POA: Diagnosis not present

## 2017-08-17 DIAGNOSIS — Z9049 Acquired absence of other specified parts of digestive tract: Secondary | ICD-10-CM | POA: Insufficient documentation

## 2017-08-17 DIAGNOSIS — Z79899 Other long term (current) drug therapy: Secondary | ICD-10-CM | POA: Insufficient documentation

## 2017-08-17 DIAGNOSIS — R197 Diarrhea, unspecified: Secondary | ICD-10-CM | POA: Diagnosis present

## 2017-08-17 DIAGNOSIS — I120 Hypertensive chronic kidney disease with stage 5 chronic kidney disease or end stage renal disease: Secondary | ICD-10-CM | POA: Diagnosis not present

## 2017-08-17 DIAGNOSIS — Z7982 Long term (current) use of aspirin: Secondary | ICD-10-CM | POA: Diagnosis not present

## 2017-08-17 DIAGNOSIS — R51 Headache: Secondary | ICD-10-CM | POA: Diagnosis not present

## 2017-08-17 LAB — URINALYSIS, COMPLETE (UACMP) WITH MICROSCOPIC
BILIRUBIN URINE: NEGATIVE
Glucose, UA: 50 mg/dL — AB
Ketones, ur: NEGATIVE mg/dL
Nitrite: NEGATIVE
PH: 6 (ref 5.0–8.0)
Protein, ur: 100 mg/dL — AB
SPECIFIC GRAVITY, URINE: 1.012 (ref 1.005–1.030)

## 2017-08-17 LAB — CBC
HCT: 36 % (ref 35.0–47.0)
HEMOGLOBIN: 11.5 g/dL — AB (ref 12.0–16.0)
MCH: 29.8 pg (ref 26.0–34.0)
MCHC: 32 g/dL (ref 32.0–36.0)
MCV: 93.2 fL (ref 80.0–100.0)
Platelets: 122 10*3/uL — ABNORMAL LOW (ref 150–440)
RBC: 3.86 MIL/uL (ref 3.80–5.20)
RDW: 15.3 % — AB (ref 11.5–14.5)
WBC: 4.9 10*3/uL (ref 3.6–11.0)

## 2017-08-17 LAB — BASIC METABOLIC PANEL
Anion gap: 10 (ref 5–15)
BUN: 32 mg/dL — AB (ref 6–20)
CALCIUM: 9 mg/dL (ref 8.9–10.3)
CO2: 16 mmol/L — AB (ref 22–32)
Chloride: 114 mmol/L — ABNORMAL HIGH (ref 101–111)
Creatinine, Ser: 3.92 mg/dL — ABNORMAL HIGH (ref 0.44–1.00)
GFR calc Af Amer: 12 mL/min — ABNORMAL LOW (ref >60)
GFR, EST NON AFRICAN AMERICAN: 10 mL/min — AB (ref >60)
GLUCOSE: 132 mg/dL — AB (ref 65–99)
Potassium: 5.8 mmol/L — ABNORMAL HIGH (ref 3.5–5.1)
Sodium: 140 mmol/L (ref 135–145)

## 2017-08-17 LAB — GLUCOSE, CAPILLARY: GLUCOSE-CAPILLARY: 119 mg/dL — AB (ref 65–99)

## 2017-08-17 LAB — TROPONIN I: Troponin I: <0.03 ng/mL (ref <0.03)

## 2017-08-17 MED ORDER — SODIUM CHLORIDE 0.9 % IV BOLUS (SEPSIS)
500.0000 mL | Freq: Once | INTRAVENOUS | Status: AC
  Administered 2017-08-17: 500 mL via INTRAVENOUS

## 2017-08-17 MED ORDER — HYDRALAZINE HCL 50 MG PO TABS: 50.0000 mg | ORAL_TABLET | Freq: Three times a day (TID) | ORAL | 0 refills | Status: DC

## 2017-08-17 NOTE — ED Provider Notes (Addendum)
[facility] Emergency Department Provider Note  ____________________________________________   I have reviewed the triage vital signs and the nursing notes. Where available I have reviewed prior notes and, if possible and indicated, outside hospital notes.    HISTORY  Chief Complaint Dizziness    HPI Alexis Tucker is a 77 y.o. female with a history of age for kidney disease, diabetes, hypertension, she states that 2 different things are happening when she ran out of her hydralazine several weeks ago, and the other is that she had some diarrhea over the last 2 days.  Nonbloody nonbilious, she states that she has had very little to eat or drink over the last 2 days either, she cannot really explain why, she has not been nauseated or vomiting.  She has no abdominal pain.  She states that she felt a little lightheaded yesterday, and then woke up this morning and stood up and felt lightheaded.  She did not have any focal numbness or weakness, she denies true vertigo she just felt lightheaded.  She is thought that may be eating would make her worse so she has had minimal to eat or drink today.  Again no abdominal pain no chest pain no shortness of breath, just felt lightheaded after having diarrhea 2 days ago.  She has never had any melena or bright red blood per rectum or hematemesis.   Past Medical History:  Diagnosis Date  . Anemia    per prior pcp  . Carotid stenosis, asymptomatic 01/2014   mild ICA, severe in ECA, rec rpt [place] 2 years  . Cataracts, bilateral   . CKD (chronic kidney disease) stage 4, GFR 15-29 ml/min (HCC) 01/2012   Cr 3.85, in 3s since 2010, discussing HD and L forearm graft ([place])  . Diabetes type 2, controlled ([place]) 2000s  . Diabetic retinopathy with macular edema ([place]) 03/2014   [place] at [facility] eye on avastin  . DJD (degenerative joint disease)    per prior pcp  . GERD (gastroesophageal reflux disease)   . Glaucoma 03/2014  . History  of breast cancer 2008   infiltrating ductal carcinoma s/p mammosite  . History of colon cancer 2006   s/p colectomy  . History of hepatitis A 1990s   from food, resolved  . HTN (hypertension)   . Macular degeneration    wet R with vision loss, dry L; to see retina specialist  . Neuropathy    per prior pcp  . OA (osteoarthritis)    knees and hips  . Osteonecrosis (HCC)    hips?  Marland Kitchen PONV (postoperative nausea and vomiting)   . Post-surgical hypothyroidism   . Wears dentures    partial upper    Patient Active Problem List   Diagnosis Date Noted  . PAF (paroxysmal atrial fibrillation) ([place])   . Nausea and vomiting 05/04/2016  . Elevated troponin 05/04/2016  . Assistance needed with transportation 07/31/2015  . Advanced care planning/counseling discussion 07/27/2015  . Lower back pain 03/14/2014  . Hip pain, bilateral 03/14/2014  . Macular degeneration   . Diabetic retinopathy with macular edema ([place]) 03/03/2014  . Medicare annual wellness visit, subsequent 01/07/2013  . Chronic kidney disease (CKD), stage V ([place]) 01/06/2013  . Carotid stenosis, asymptomatic 12/05/2012  . Post-surgical hypothyroidism   . OA (osteoarthritis)   . Type 2 diabetes mellitus, uncontrolled, with renal complications ([place])   . HTN (hypertension)   . History of breast cancer   . History of colon cancer   .  GERD (gastroesophageal reflux disease)     Past Surgical History:  Procedure Laterality Date  . ABDOMINAL HYSTERECTOMY  1976   after tubal pregnancy, h/o fibroids with heavy bleeding  . BREAST MAMMOSITE  2008  . carotid ultrasound  12/2012   1-39% bilateral stenosis, severe in ECA  . CATARACT EXTRACTION W/PHACO Left 08/14/2016   Procedure: CATARACT EXTRACTION PHACO AND INTRAOCULAR LENS PLACEMENT (IOC)  Left Diabetic;  Surgeon: Ronnell Freshwater, MD;  Location: [place];  Service: Ophthalmology;  Laterality: Left;  Diabetic - oral meds  . CHOLECYSTECTOMY  2005  . COLECTOMY  2005    colon cancer  . COLON SURGERY  2006   bowel obstruction  . COLONOSCOPY  2008   ext hem, ileo-colonic anastomosis in cecum, tubular adenoma x1, rec rpt 1 yr for multiple polyps (in [place])  . ESOPHAGOGASTRODUODENOSCOPY N/A 05/06/2016   Procedure: ESOPHAGOGASTRODUODENOSCOPY (EGD);  Surgeon: Manya Silvas, MD;  Location: [facility] ENDOSCOPY;  Service: Endoscopy;  Laterality: N/A;  . THYROIDECTOMY, PARTIAL     unclear why  . TOTAL KNEE ARTHROPLASTY Right 1999  . US ECHOCARDIOGRAPHY  11/2007   nl LV fxn EF 09%, diastolic dysfunction, LVH, tr TR and MR, slcerotic aortic valve    Prior to Admission medications   Medication Sig Start Date End Date Taking? Authorizing Provider  acetaminophen (TYLENOL) 325 MG tablet Take 2 tablets (650 mg total) by mouth every 6 (six) hours as needed for mild pain (or Fever >/= 101). 05/08/16   Gladstone Lighter, MD  Acetylcysteine 600 MG CAPS Take by mouth daily.    [provider]  allopurinol (ZYLOPRIM) 100 MG tablet Take 1 tablet (100 mg total) by mouth daily. 11/07/16   Ria Bush, MD  anastrozole (ARIMIDEX) 1 MG tablet TAKE 1 TABLET BY MOUTH DAILY 11/24/16   Ria Bush, MD  ARMOUR THYROID 60 MG tablet TAKE 1 TABLET BY MOUTH DAILY 01/01/17   Ria Bush, MD  aspirin EC 81 MG EC tablet Take 1 tablet (81 mg total) by mouth daily. 05/09/16   Gladstone Lighter, MD  Blood Glucose Monitoring Suppl [facility] BLOOD GLUCOSE METER) W/DEVICE KIT Use as directed to check sugars daily 250.42 03/13/14   Ria Bush, MD  Calcium-Magnesium-Vitamin D (CALCIUM MAGNESIUM PO) Take 1 tablet by mouth daily.    [provider]  carvedilol (COREG) 25 MG tablet TAKE 1 TABLET BY MOUTH DAILY 11/07/16   Ria Bush, MD  Cholecalciferol (VITAMIN D-3) 5000 UNITS TABS Take 1 tablet by mouth daily.    [provider]  ferrous sulfate (SLOW IRON) 160 (50 Fe) MG TBCR SR tablet Take 1 tablet (160 mg total) by mouth daily. 11/07/16   Ria Bush, MD   Folic Acid-Vit G2-EZM O29 (FOLBEE) 2.5-25-1 MG TABS tablet Take 1 tablet by mouth daily.    [provider]  hydrALAZINE (APRESOLINE) 50 MG tablet Take 1 tablet (50 mg total) by mouth 3 (three) times daily. 05/08/16   Gladstone Lighter, MD  isosorbide dinitrate (ISORDIL) 20 MG tablet Take 1 tablet (20 mg total) by mouth daily. 11/07/16   Ria Bush, MD  latanoprost (XALATAN) 0.005 % ophthalmic solution Place 1 drop into both eyes at bedtime.    [provider]  ONGLYZA 5 MG TABS tablet TAKE 1 TABLET BY MOUTH DAILY 08/08/17   Ria Bush, MD  pantoprazole (PROTONIX) 40 MG tablet TAKE ONE TABLET BY MOUTH DAILY AS NEEDED 07/09/17   Ria Bush, MD  pentoxifylline (TRENTAL) 400 MG CR tablet TAKE 1 TABLET  BY MOUTH DAILY 01/01/17   Ria Bush, MD  promethazine (PHENERGAN) 25 MG tablet Take 1 tablet (25 mg total) by mouth every 6 (six) hours as needed for nausea or vomiting. Patient not taking: Reported on 11/07/2016 05/08/16   Gladstone Lighter, MD    Allergies Tramadol; Codeine; and Sulfa antibiotics  Family History  Problem Relation Age of Onset  . Diabetes Mother   . Hypertension Mother   . Arthritis Mother   . Alcohol abuse Father   . Arthritis Father   . Stroke Daughter   . CAD Neg Hx   . Cancer Neg Hx     Social History Social History   Tobacco Use  . Smoking status: Former Research scientist (life sciences)  . Smokeless tobacco: Never Used  . Tobacco comment: quit over 40 yrs ago  Substance Use Topics  . Alcohol use: No    Comment: rare  . Drug use: No    Review of Systems Constitutional: No fever/chills Eyes: No visual changes. ENT: No sore throat. No stiff neck no neck pain Cardiovascular: Denies chest pain. Respiratory: Denies shortness of breath. Gastrointestinal:   no vomiting.  No diarrhea.  No constipation. Genitourinary: Negative for dysuria. Musculoskeletal: Negative lower extremity swelling Skin: Negative for rash. Neurological: Negative for severe  headaches, focal weakness or numbness.   ____________________________________________   PHYSICAL EXAM:  VITAL SIGNS: ED Triage Vitals  Enc Vitals Group     BP 08/17/17 1406 (!) 174/76     Pulse Rate 08/17/17 1406 71     Resp 08/17/17 1406 16     Temp 08/17/17 1406 98.1 F (36.7 C)     Temp Source 08/17/17 1406 Oral     SpO2 08/17/17 1406 100 %     Weight 08/17/17 1407 212 lb (96.2 kg)     Height 08/17/17 1407 '5\' 3"'$  (1.6 m)     Head Circumference --      Peak Flow --      Pain Score --      Pain Loc --      Pain Edu? --      Excl. in [place]? --     Constitutional: Alert and oriented. Well appearing and in no acute distress. Eyes: Conjunctivae are normal Head: Atraumatic HEENT: No congestion/rhinnorhea. Mucous membranes are dry.  Oropharynx non-erythematous Neck:   Nontender with no meningismus, no masses, no stridor Cardiovascular: Normal rate, regular rhythm. Grossly normal heart sounds.  Good peripheral circulation. Respiratory: Normal respiratory effort.  No retractions. Lungs CTAB. Abdominal: Soft and nontender. No distention. No guarding no rebound Back:  There is no focal tenderness or step off.  there is no midline tenderness there are no lesions noted. there is no CVA tenderness Musculoskeletal: No lower extremity tenderness, no upper extremity tenderness. No joint effusions, no DVT signs strong distal pulses no edema Neurologic: Cranial nerves II through XII are grossly intact 5 out of 5 strength bilateral upper and lower extremity. Finger to nose within normal limits heel to shin within normal limits, speech is normal with no word finding difficulty or dysarthria, reflexes symmetric, pupils are equally round and reactive to light, there is no pronator drift, sensation is normal, vision is intact to confrontation, gait is deferred, there is no nystagmus, normal neurologic exam Skin:  Skin is warm, dry and intact. No rash noted. Psychiatric: Mood and affect are normal.  Speech and behavior are normal.  ____________________________________________   LABS (all labs ordered are listed, but only abnormal results are displayed)  Labs  Reviewed  BASIC METABOLIC PANEL - Abnormal; Notable for the following components:      Result Value   Potassium 5.8 (*)    Chloride 114 (*)    CO2 16 (*)    Glucose, Bld 132 (*)    BUN 32 (*)    Creatinine, Ser 3.92 (*)    GFR calc non Af Amer 10 (*)    GFR calc Af Amer 12 (*)    All other components within normal limits  CBC - Abnormal; Notable for the following components:   Hemoglobin 11.5 (*)    RDW 15.3 (*)    Platelets 122 (*)    All other components within normal limits  GLUCOSE, CAPILLARY - Abnormal; Notable for the following components:   Glucose-Capillary 119 (*)    All other components within normal limits  TROPONIN I  URINALYSIS, COMPLETE (UACMP) WITH MICROSCOPIC    Pertinent labs  results that were available during my care of the patient were reviewed by me and considered in my medical decision making (see chart for details). ____________________________________________  EKG  I personally interpreted any EKGs ordered by me or triage Sinus rhythm rate 69 bpm no acute ST elevation or depression, unremarkable EKG ____________________________________________  RADIOLOGY  Pertinent labs & imaging results that were available during my care of the patient were reviewed by me and considered in my medical decision making (see chart for details). If possible, patient and/or family made aware of any abnormal findings.  Ct Head Wo Contrast  Result Date: 08/17/2017 CLINICAL DATA:  Dizziness and headache beginning this morning. No known injury. EXAM: CT HEAD WITHOUT CONTRAST TECHNIQUE: Contiguous axial images were obtained from the base of the skull through the vertex without intravenous contrast. COMPARISON:  None. 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 There is atrophy and  extensive chronic microvascular ischemic change. Remote lacunar infarction left pons also noted. No evidence of acute abnormality including hemorrhage, infarct, mass lesion, mass effect, midline shift or abnormal extra-axial fluid collection. No hydrocephalus or pneumocephalus. Vascular: Extensive atherosclerosis. Skull: Intact. Sinuses/Orbits: No acute abnormality. The patient is status post left lens extraction. Other: None. IMPRESSION: No acute abnormality. Atrophy, extensive chronic microvascular ischemic change and remote parietotemporal infarct. Extensive atherosclerosis. Electronically Signed   By: Inge Rise M.D.   On: 08/17/2017 14:47   ____________________________________________    PROCEDURES  Procedure(s) performed: None  Procedures  Critical Care performed: None  ____________________________________________   INITIAL IMPRESSION / ASSESSMENT AND PLAN / ED COURSE  Pertinent labs & imaging results that were available during my care of the patient were reviewed by me and considered in my medical decision making (see chart for details).  Patient here had a diarrheal illness a few days ago, now when she stands up she feels lightheaded.  She appears dehydrated but her blood work is reassuring CT scan is reassuring neurologic exam is reassuring, most likely this is postural dehydration, low suspicion for cerebellar or other CVA.  We will give her IV fluids and reassess.  Abdomen benign, and she has no cardiothoracic complaints such as chest pain   ----------------------------------------- 10:52 PM on 08/17/2017 -----------------------------------------  Had orthostatic symptoms although her vital signs remained relatively solid during orthostatic vital signs.  After IV fluid, and eating she feels 100% better.  She is ambulatory with her cane.  I have offered her admission and further observation I have offered her further fluid hydration and she refuses all this she is with  family and she  is insisting on discharge.  Blood pressure slightly elevated but she did not take her blood pressure medication today we will write for this.  I have encouraged her to take p.o. fluids.  She will follow close with primary care on Monday.  She is neurologically intact with a NIH stroke scale is 0.   ____________________________________________   FINAL CLINICAL IMPRESSION(S) / ED DIAGNOSES  Final diagnoses:  None      This chart was dictated using voice recognition software.  Despite best efforts to proofread,  errors can occur which can change meaning.      Schuyler Amor, MD 08/17/17 2024    Schuyler Amor, MD 08/17/17 2253

## 2017-08-17 NOTE — ED Triage Notes (Signed)
FIRST NURSE NOTE:  Pt to ed with c/o dizziness and vomiting, denies chest pain.

## 2017-08-17 NOTE — ED Triage Notes (Signed)
Pt to ED from home c/o dizziness that started today when waking up.  States unbalanced and room spinning feeling.  Denies SOB, vomiting, diarrhea, or pain.  States intermittent nausea.  Hx of diabetes, sometimes checks BS at home.

## 2017-08-17 NOTE — ED Notes (Signed)
Pt. Ambulated

## 2017-08-17 NOTE — Discharge Instructions (Signed)
Would prefer to go home rather than have further workup or fluids which is certainly not unreasonable but it does limit our ability to further take care of you.  Please drink plenty of fluids.  We will rewrite hydralazine prescription as you are asking [place] to do so however I would hold off on taking it for the next few days until you are no longer lightheaded as it can cause you to be more lightheaded.  If you have headache, numbness, weakness, or you feel worse in any way, please return to the emergency room.  Follow closely with your doctor on Monday without fail.

## 2017-08-17 NOTE — ED Notes (Signed)
ED Provider at bedside. 

## 2017-08-18 ENCOUNTER — Telehealth: Payer: Self-pay | Admitting: Family Medicine

## 2017-08-18 NOTE — Telephone Encounter (Signed)
Pt seen at ER over weekend with diarrhea and dehydration. plz call and speak with patient or daughter Santiago Glad (on [place]) for follow up, offer appt and see when she last saw renal.  Patient with h/o poor follow up (last seen 10/2016) and stage 5 CKD, unsure if she's been following with Dr Candiss Norse her nephrologist.  Last note I received from renal was 2017.

## 2017-08-21 NOTE — Telephone Encounter (Signed)
Unable to reach patient but I was able to speak with daughter.  Mother is doing okay after recent ER visit.  They do not have any questions or concerns for [place] at this time.  Daughter is unaware as to how long it has been since her mom has seen nephrology but can discuss at next office visit.  Daughter refused earlier appointment times offered this week for hospital follow up.  She did agree to 2:30pm time on Monday 08/27/17 with Dr. Darnell Level and will accompany her mother to the appointment.

## 2017-08-27 ENCOUNTER — Encounter: Payer: Self-pay | Admitting: Family Medicine

## 2017-08-27 ENCOUNTER — Ambulatory Visit (INDEPENDENT_AMBULATORY_CARE_PROVIDER_SITE_OTHER): Payer: Medicare Other | Admitting: Family Medicine

## 2017-08-27 VITALS — BP 118/64 | HR 74 | Temp 97.8°F | Wt 207.0 lb

## 2017-08-27 DIAGNOSIS — I10 Essential (primary) hypertension: Secondary | ICD-10-CM | POA: Diagnosis not present

## 2017-08-27 DIAGNOSIS — K219 Gastro-esophageal reflux disease without esophagitis: Secondary | ICD-10-CM | POA: Diagnosis not present

## 2017-08-27 DIAGNOSIS — I6523 Occlusion and stenosis of bilateral carotid arteries: Secondary | ICD-10-CM | POA: Diagnosis not present

## 2017-08-27 DIAGNOSIS — E860 Dehydration: Secondary | ICD-10-CM

## 2017-08-27 DIAGNOSIS — D631 Anemia in chronic kidney disease: Secondary | ICD-10-CM | POA: Insufficient documentation

## 2017-08-27 DIAGNOSIS — Z748 Other problems related to care provider dependency: Secondary | ICD-10-CM

## 2017-08-27 DIAGNOSIS — Z853 Personal history of malignant neoplasm of breast: Secondary | ICD-10-CM

## 2017-08-27 DIAGNOSIS — IMO0002 Reserved for concepts with insufficient information to code with codable children: Secondary | ICD-10-CM

## 2017-08-27 DIAGNOSIS — N189 Chronic kidney disease, unspecified: Secondary | ICD-10-CM

## 2017-08-27 DIAGNOSIS — E890 Postprocedural hypothyroidism: Secondary | ICD-10-CM | POA: Diagnosis not present

## 2017-08-27 DIAGNOSIS — M1990 Unspecified osteoarthritis, unspecified site: Secondary | ICD-10-CM

## 2017-08-27 DIAGNOSIS — N185 Chronic kidney disease, stage 5: Secondary | ICD-10-CM

## 2017-08-27 DIAGNOSIS — D696 Thrombocytopenia, unspecified: Secondary | ICD-10-CM

## 2017-08-27 DIAGNOSIS — Z85038 Personal history of other malignant neoplasm of large intestine: Secondary | ICD-10-CM | POA: Diagnosis not present

## 2017-08-27 DIAGNOSIS — E1165 Type 2 diabetes mellitus with hyperglycemia: Secondary | ICD-10-CM

## 2017-08-27 DIAGNOSIS — D649 Anemia, unspecified: Secondary | ICD-10-CM | POA: Insufficient documentation

## 2017-08-27 DIAGNOSIS — I480 Paroxysmal atrial fibrillation: Secondary | ICD-10-CM | POA: Diagnosis not present

## 2017-08-27 DIAGNOSIS — E11311 Type 2 diabetes mellitus with unspecified diabetic retinopathy with macular edema: Secondary | ICD-10-CM | POA: Diagnosis not present

## 2017-08-27 DIAGNOSIS — Z8673 Personal history of transient ischemic attack (TIA), and cerebral infarction without residual deficits: Secondary | ICD-10-CM

## 2017-08-27 DIAGNOSIS — E1129 Type 2 diabetes mellitus with other diabetic kidney complication: Secondary | ICD-10-CM | POA: Diagnosis not present

## 2017-08-27 MED ORDER — THYROID 60 MG PO TABS: 60.0000 mg | ORAL_TABLET | Freq: Every day | ORAL | 1 refills | Status: DC

## 2017-08-27 MED ORDER — DICLOFENAC SODIUM 1 % TD GEL: 1.0000 "application " | Freq: Three times a day (TID) | TRANSDERMAL | 1 refills | Status: DC

## 2017-08-27 MED ORDER — ISOSORBIDE DINITRATE 20 MG PO TABS: 20.0000 mg | ORAL_TABLET | Freq: Every day | ORAL | 1 refills | Status: DC

## 2017-08-27 MED ORDER — SAXAGLIPTIN HCL 2.5 MG PO TABS: 2.5000 mg | ORAL_TABLET | Freq: Every day | ORAL | 6 refills | Status: DC

## 2017-08-27 MED ORDER — CARVEDILOL 25 MG PO TABS: 25.0000 mg | ORAL_TABLET | Freq: Every day | ORAL | 1 refills | Status: DC

## 2017-08-27 MED ORDER — ANASTROZOLE 1 MG PO TABS: 1.0000 mg | ORAL_TABLET | Freq: Every day | ORAL | 1 refills | Status: DC

## 2017-08-27 MED ORDER — PANTOPRAZOLE SODIUM 40 MG PO TBEC: 40.0000 mg | DELAYED_RELEASE_TABLET | Freq: Every day | ORAL | 1 refills | Status: DC | PRN

## 2017-08-27 MED ORDER — ACETAMINOPHEN 325 MG PO TABS: 325.0000 mg | ORAL_TABLET | Freq: Four times a day (QID) | ORAL | Status: DC | PRN

## 2017-08-27 MED ORDER — PENTOXIFYLLINE ER 400 MG PO TBCR: 400.0000 mg | EXTENDED_RELEASE_TABLET | Freq: Every day | ORAL | 1 refills | Status: DC

## 2017-08-27 NOTE — Assessment & Plan Note (Signed)
Chronic, continue to monitor.  ?

## 2017-08-27 NOTE — Assessment & Plan Note (Signed)
Continue to monitor

## 2017-08-27 NOTE — Assessment & Plan Note (Addendum)
Update TFTs Pt reports compliance with armour thyroid.

## 2017-08-27 NOTE — Assessment & Plan Note (Signed)
H/o this. Did not return to cards 2017. Sounds regular today. Consider updated EKG next visit.

## 2017-08-27 NOTE — Assessment & Plan Note (Addendum)
Stable period, however reviewed with patient and daughter the importance of close f/u with renal. I don't see she's seen Dr Merita Norton recently - rec they call to reschedule appointment. Referral placed today.

## 2017-08-27 NOTE — Assessment & Plan Note (Addendum)
Still overdue - requests re-appointment - will ask our referral coordinator to call and schedule this as well as dexa scan. Continues arimidex daily - refilled. If normal mammo, consider discontinuing arimidex

## 2017-08-27 NOTE — Assessment & Plan Note (Signed)
Continue aspirin 81 mg daily.   

## 2017-08-27 NOTE — Assessment & Plan Note (Addendum)
Reviewed tylenol dosing - states 500mg  tylenol is sedating. Suggested starting with 325mg  dose. Reviewed need to avoid oral NSAIDs. voltaren gel refilled.

## 2017-08-27 NOTE — Assessment & Plan Note (Signed)
Requests we reorder carotid US.

## 2017-08-27 NOTE — Assessment & Plan Note (Signed)
Continue protonix PRN refilled today.

## 2017-08-27 NOTE — Patient Instructions (Addendum)
Labs today.  I would like you to return to see Dr Candiss Norse - call to schedule an appointment [phone].  Decrease onglyza dose to 2.5mg  daily - new dose sent to pharmacy. Try lower tylenol dose over the counter (325mg ). See Rosaria Ferries our referral coordinator to schedule mammogram/bone density scan, carotid ultrasound.   Keep appointment with Lattie Haw for medicare wellness visit. Reschedule my next appointment for 3-4 months from today.

## 2017-08-27 NOTE — Assessment & Plan Note (Addendum)
Now daughter lives nearby, this facilitates transportation. Encouraged close f/u with me.

## 2017-08-27 NOTE — Assessment & Plan Note (Addendum)
Update A1c. Reviewed onglyza dosing with ESRD - will decrease to 2.5mg  dose. Will see if we can add fructosamine to blood drawn today.

## 2017-08-27 NOTE — Assessment & Plan Note (Signed)
Appreciate ophtho care - receiving injections in eyes.

## 2017-08-27 NOTE — Assessment & Plan Note (Signed)
Sounds like she never completed recommended f/u.  Did not return iFOB last ordered 2017.

## 2017-08-27 NOTE — Assessment & Plan Note (Signed)
S/p IVF rehydration. Update labs today.

## 2017-08-27 NOTE — Progress Notes (Signed)
BP 118/64 (BP Location: Left Arm, Patient Position: Sitting, Cuff Size: Normal)   Pulse 74   Temp 97.8 F (36.6 C) (Oral)   Wt 207 lb (93.9 kg)   SpO2 98%   BMI 36.67 kg/m    CC: ER f/u visit Subjective:    Patient ID: Alexis Tucker, female    DOB: [birth year], 76 y.o.   MRN: [id number]  HPI: Alexis Tucker is a 77 y.o. female presenting on 08/27/2017 for Hospitalization Follow-up (Seen at [facility] on 08/17/17, dx dehydration. Pt accompanined by daughter, Santiago Glad.)   Here with daughter Santiago Glad who lives nearby. She drove her here today.  Getting eye injections every 4 wks through [facility] (Dr Ricki Miller).   I last saw patient 10/2016. Poor f/u. Last renal note I have is from 09/2015 Candiss Norse). Pt states she never returned for f/u.   Recent hospitalization for lightheadedness presumed from dehydration in setting of diarrheal illness, notes reviewed. Head CT was non-acute (atrophy, extensive chronic ischemic changes with remote R parieto-temopral infarct). Cr was 3.92, K was 5.8. She felt significantly improved after IV fluid rehydration.   Denies chest pain, tightness, dyspnea.  Voiding well.   DM - does check sugars, acura brand glucose meter. Due for recheck. Regularly sees eye doctor.   Relevant past medical, surgical, family and social history reviewed and updated as indicated. Interim medical history since our last visit reviewed. Allergies and medications reviewed and updated. Outpatient Medications Prior to Visit  Medication Sig Dispense Refill  . Acetylcysteine 600 MG CAPS Take by mouth daily.    Marland Kitchen allopurinol (ZYLOPRIM) 100 MG tablet Take 1 tablet (100 mg total) by mouth daily. 90 tablet 2  . aspirin EC 81 MG EC tablet Take 1 tablet (81 mg total) by mouth daily. 30 tablet 12  . Blood Glucose Monitoring Suppl (ACURA BLOOD GLUCOSE METER) W/DEVICE KIT Use as directed to check sugars daily 250.42 1 kit 0  . Calcium-Magnesium-Vitamin D (CALCIUM MAGNESIUM PO) Take 1 tablet by mouth  daily.    . Cholecalciferol (VITAMIN D-3) 5000 UNITS TABS Take 1 tablet by mouth daily.    . ferrous sulfate (SLOW IRON) 160 (50 Fe) MG TBCR SR tablet Take 1 tablet (160 mg total) by mouth daily. 90 each 3  . Folic Acid-Vit O6-ZTI W58 (FOLBEE) 2.5-25-1 MG TABS tablet Take 1 tablet by mouth daily.    Marland Kitchen latanoprost (XALATAN) 0.005 % ophthalmic solution Place 1 drop into both eyes at bedtime.    . promethazine (PHENERGAN) 25 MG tablet Take 1 tablet (25 mg total) by mouth every 6 (six) hours as needed for nausea or vomiting. 30 tablet 0  . acetaminophen (TYLENOL) 325 MG tablet Take 2 tablets (650 mg total) by mouth every 6 (six) hours as needed for mild pain (or Fever >/= 101). 30 tablet 0  . anastrozole (ARIMIDEX) 1 MG tablet TAKE 1 TABLET BY MOUTH DAILY 30 tablet 6  . ARMOUR THYROID 60 MG tablet TAKE 1 TABLET BY MOUTH DAILY 30 tablet 5  . carvedilol (COREG) 25 MG tablet TAKE 1 TABLET BY MOUTH DAILY 90 tablet 2  . hydrALAZINE (APRESOLINE) 50 MG tablet Take 1 tablet (50 mg total) by mouth 3 (three) times daily. 90 tablet 0  . isosorbide dinitrate (ISORDIL) 20 MG tablet Take 1 tablet (20 mg total) by mouth daily. 90 tablet 2  . ONGLYZA 5 MG TABS tablet TAKE 1 TABLET BY MOUTH DAILY 30 tablet 0  . pantoprazole (PROTONIX) 40 MG tablet TAKE ONE  TABLET BY MOUTH DAILY AS NEEDED 30 tablet 4  . pentoxifylline (TRENTAL) 400 MG CR tablet TAKE 1 TABLET BY MOUTH DAILY 30 tablet 5  . saxagliptin HCl (ONGLYZA) 2.5 MG TABS tablet Take 1 tablet (2.5 mg total) by mouth daily.     No facility-administered medications prior to visit.      Per HPI unless specifically indicated in ROS section below Review of Systems     Objective:    BP 118/64 (BP Location: Left Arm, Patient Position: Sitting, Cuff Size: Normal)   Pulse 74   Temp 97.8 F (36.6 C) (Oral)   Wt 207 lb (93.9 kg)   SpO2 98%   BMI 36.67 kg/m   Wt Readings from Last 3 Encounters:  08/27/17 207 lb (93.9 kg)  08/17/17 212 lb (96.2 kg)  11/07/16  207 lb 4 oz (94 kg)    Physical Exam  Constitutional: She is oriented to person, place, and time. She appears well-developed and well-nourished. No distress.  HENT:  Mouth/Throat: Oropharynx is clear and moist. No oropharyngeal exudate.  Eyes: Conjunctivae and EOM are normal.  Neck: Normal range of motion. Neck supple.  Cardiovascular: Normal rate, regular rhythm, normal heart sounds and intact distal pulses.  No murmur heard. Pulmonary/Chest: Effort normal and breath sounds normal. No respiratory distress. She has no wheezes. She has no rales.  Musculoskeletal: She exhibits no edema.  Tenderness bilateral legs  Neurological: She is alert and oriented to person, place, and time.  Skin: Skin is warm and dry. No rash noted.  Psychiatric: She has a normal mood and affect.  Nursing note and vitals reviewed.  Results for orders placed or performed during the hospital encounter of 75/17/00  Basic metabolic panel  Result Value Ref Range   Sodium 140 135 - 145 mmol/L   Potassium 5.8 (H) 3.5 - 5.1 mmol/L   Chloride 114 (H) 101 - 111 mmol/L   CO2 16 (L) 22 - 32 mmol/L   Glucose, Bld 132 (H) 65 - 99 mg/dL   BUN 32 (H) 6 - 20 mg/dL   Creatinine, Ser 3.92 (H) 0.44 - 1.00 mg/dL   Calcium 9.0 8.9 - 10.3 mg/dL   GFR calc non Af Amer 10 (L) >60 mL/min   GFR calc Af Amer 12 (L) >60 mL/min   Anion gap 10 5 - 15  CBC  Result Value Ref Range   WBC 4.9 3.6 - 11.0 K/uL   RBC 3.86 3.80 - 5.20 MIL/uL   Hemoglobin 11.5 (L) 12.0 - 16.0 g/dL   HCT 36.0 35.0 - 47.0 %   MCV 93.2 80.0 - 100.0 fL   MCH 29.8 26.0 - 34.0 pg   MCHC 32.0 32.0 - 36.0 g/dL   RDW 15.3 (H) 11.5 - 14.5 %   Platelets 122 (L) 150 - 440 K/uL  Troponin I  Result Value Ref Range   Troponin I <0.03 <0.03 ng/mL  Glucose, capillary  Result Value Ref Range   Glucose-Capillary 119 (H) 65 - 99 mg/dL  Urinalysis, Complete w Microscopic  Result Value Ref Range   Color, Urine YELLOW (A) YELLOW   APPearance CLEAR (A) CLEAR   Specific  Gravity, Urine 1.012 1.005 - 1.030   pH 6.0 5.0 - 8.0   Glucose, UA 50 (A) NEGATIVE mg/dL   Hgb urine dipstick SMALL (A) NEGATIVE   Bilirubin Urine NEGATIVE NEGATIVE   Ketones, ur NEGATIVE NEGATIVE mg/dL   Protein, ur 100 (A) NEGATIVE mg/dL   Nitrite NEGATIVE NEGATIVE  Leukocytes, UA SMALL (A) NEGATIVE   RBC / HPF 0-5 0 - 5 RBC/hpf   WBC, UA 6-30 0 - 5 WBC/hpf   Bacteria, UA RARE (A) NONE SEEN   Squamous Epithelial / LPF 0-5 (A) NONE SEEN   Mucus PRESENT    Lab Results  Component Value Date   HGBA1C 6.5 11/02/2016       Assessment & Plan:  Over 30 minutes were spent face-to-face with the patient during this encounter and >50% of that time was spent on counseling and coordination of care. Extensive med rec performed with patient and daughter Problem List Items Addressed This Visit    Anemia in chronic kidney disease (CKD)    Continue to monitor.       Assistance needed with transportation    Now daughter lives nearby, this facilitates transportation. Encouraged close f/u with me.       Carotid stenosis, asymptomatic    Requests we reorder carotid US.       Relevant Medications   carvedilol (COREG) 25 MG tablet   isosorbide dinitrate (ISORDIL) 20 MG tablet   Other Relevant Orders   VAS US CAROTID   Chronic kidney disease (CKD), stage V (HCC) - Primary    Stable period, however reviewed with patient and daughter the importance of close f/u with renal. I don't see she's seen Dr Merita Norton recently - rec they call to reschedule appointment. Referral placed today.       Relevant Orders   Renal function panel   Ambulatory referral to Nephrology   Dehydration    S/p IVF rehydration. Update labs today.       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      Relevant Medications   saxagliptin HCl (ONGLYZA) 2.5 MG TABS tablet   GERD (gastroesophageal reflux disease)    Continue protonix PRN refilled today.        Relevant Medications   pantoprazole (PROTONIX) 40 MG tablet   History of arterial ischemic stroke    Continue aspirin 26m daily.      Relevant Medications   carvedilol (COREG) 25 MG tablet   isosorbide dinitrate (ISORDIL) 20 MG tablet   History of breast cancer    Still overdue - requests re-appointment - will ask our referral coordinator to call and schedule this as well as dexa scan. Continues arimidex daily - refilled. If normal mammo, consider discontinuing arimidex      History of colon cancer    Sounds like she never completed recommended f/u.  Did not return iFOB last ordered 2017.      HTN (hypertension)    Chronic, stable. Pt states she has not been taking hydralazine. Will remove from med list .      Relevant Medications   carvedilol (COREG) 25 MG tablet   isosorbide dinitrate (ISORDIL) 20 MG tablet   OA (osteoarthritis)    Reviewed tylenol dosing - states 5038mtylenol is sedating. Suggested starting with 32588mose. Reviewed need to avoid oral NSAIDs. voltaren gel refilled.       Relevant Medications   acetaminophen (TYLENOL) 325 MG tablet   anastrozole (ARIMIDEX) 1 MG tablet   PAF (paroxysmal atrial fibrillation) (HCC)    H/o this. Did not return to cards 2017. Sounds regular today. Consider updated EKG next visit.       Relevant Medications   carvedilol (COREG) 25 MG tablet   isosorbide dinitrate (ISORDIL) 20 MG tablet  Post-surgical hypothyroidism    Update TFTs Pt reports compliance with armour thyroid.      Relevant Medications   thyroid (ARMOUR THYROID) 60 MG tablet   carvedilol (COREG) 25 MG tablet   Other Relevant Orders   TSH   T4, free   T3   Thrombocytopenia (HCC)    Chronic, continue to monitor.       Type 2 diabetes mellitus, uncontrolled, with renal complications (HCC)    Update A1c. Reviewed onglyza dosing with ESRD - will decrease to 2.57m dose. Will see if we can add fructosamine to blood drawn today.       Relevant  Medications   saxagliptin HCl (ONGLYZA) 2.5 MG TABS tablet   Other Relevant Orders   Hemoglobin A1c       Meds ordered this encounter  Medications  . acetaminophen (TYLENOL) 325 MG tablet    Sig: Take 1 tablet (325 mg total) by mouth every 6 (six) hours as needed for mild pain (or Fever >/= 101).  .Marland Kitchenanastrozole (ARIMIDEX) 1 MG tablet    Sig: Take 1 tablet (1 mg total) by mouth daily.    Dispense:  90 tablet    Refill:  1  . thyroid (ARMOUR THYROID) 60 MG tablet    Sig: Take 1 tablet (60 mg total) by mouth daily.    Dispense:  90 tablet    Refill:  1  . pantoprazole (PROTONIX) 40 MG tablet    Sig: Take 1 tablet (40 mg total) by mouth daily as needed.    Dispense:  90 tablet    Refill:  1  . carvedilol (COREG) 25 MG tablet    Sig: Take 1 tablet (25 mg total) by mouth daily.    Dispense:  90 tablet    Refill:  1  . pentoxifylline (TRENTAL) 400 MG CR tablet    Sig: Take 1 tablet (400 mg total) by mouth daily.    Dispense:  90 tablet    Refill:  1  . isosorbide dinitrate (ISORDIL) 20 MG tablet    Sig: Take 1 tablet (20 mg total) by mouth daily.    Dispense:  90 tablet    Refill:  1  . diclofenac sodium (VOLTAREN) 1 % GEL    Sig: Apply 1 application topically 3 (three) times daily.    Dispense:  1 Tube    Refill:  1  . saxagliptin HCl (ONGLYZA) 2.5 MG TABS tablet    Sig: Take 1 tablet (2.5 mg total) by mouth daily.    Dispense:  30 tablet    Refill:  6   Orders Placed This Encounter  Procedures  . Renal function panel  . TSH  . T4, free  . T3  . Hemoglobin A1c  . Ambulatory referral to Nephrology    Referral Priority:   Routine    Referral Type:   Consultation    Referral Reason:   Specialty Services Required    Requested Specialty:   Nephrology    Number of Visits Requested:   1    Follow up plan: No Follow-up on file.  JRia Bush MD

## 2017-08-27 NOTE — Assessment & Plan Note (Signed)
Chronic, stable. Pt states she has not been taking hydralazine. Will remove from med list .

## 2017-08-28 ENCOUNTER — Telehealth: Payer: Self-pay | Admitting: Radiology

## 2017-08-28 ENCOUNTER — Telehealth: Payer: Self-pay

## 2017-08-28 LAB — T4, FREE: Free T4: 0.57 ng/dL — ABNORMAL LOW (ref 0.60–1.60)

## 2017-08-28 LAB — RENAL FUNCTION PANEL
ALBUMIN: 3.9 g/dL (ref 3.5–5.2)
BUN: 31 mg/dL — ABNORMAL HIGH (ref 6–23)
CALCIUM: 9.8 mg/dL (ref 8.4–10.5)
CHLORIDE: 113 meq/L — AB (ref 96–112)
CO2: 22 meq/L (ref 19–32)
Creatinine, Ser: 4.48 mg/dL — ABNORMAL HIGH (ref 0.40–1.20)
GFR: 12.33 mL/min — CL (ref >60.00)
Glucose, Bld: 161 mg/dL — ABNORMAL HIGH (ref 70–99)
Phosphorus: 4.1 mg/dL (ref 2.3–4.6)
Potassium: 5.2 mEq/L — ABNORMAL HIGH (ref 3.5–5.1)
SODIUM: 143 meq/L (ref 135–145)

## 2017-08-28 LAB — HEMOGLOBIN A1C: Hgb A1c MFr Bld: 6.2 % (ref 4.6–6.5)

## 2017-08-28 LAB — TSH: TSH: 4.54 u[IU]/mL — ABNORMAL HIGH (ref 0.35–4.50)

## 2017-08-28 NOTE — Telephone Encounter (Signed)
Pt called back and states she is ok with stating an alternative medication for Armour Thyroid.

## 2017-08-28 NOTE — Telephone Encounter (Signed)
Received fax from [facility] stating pt's Armour Thyroid 60 mg tab is not covered by her ins co.  They are suggesting an alternative.  Need to know if pt wants to stay on this med and pay out-of-pocket or if she is ok with Dr. Darnell Level prescribing an alternative medication.  Left message on vm for pt to call back.  Need to relay message above and get decision from pt.

## 2017-08-28 NOTE — Telephone Encounter (Signed)
[facility] called a critical GFR - 12.33.Results given to Dr Danise Mina.

## 2017-08-28 NOTE — Telephone Encounter (Signed)
Chronic. 

## 2017-08-29 ENCOUNTER — Telehealth: Payer: Self-pay

## 2017-08-29 ENCOUNTER — Other Ambulatory Visit: Payer: Self-pay | Admitting: Family Medicine

## 2017-08-29 DIAGNOSIS — R42 Dizziness and giddiness: Secondary | ICD-10-CM

## 2017-08-29 DIAGNOSIS — I6529 Occlusion and stenosis of unspecified carotid artery: Secondary | ICD-10-CM

## 2017-08-29 NOTE — Telephone Encounter (Signed)
Noted  

## 2017-08-29 NOTE — Telephone Encounter (Signed)
Called pt about thyroid med but realized she had already returned our call. Please disregard call.

## 2017-08-30 LAB — TEST AUTHORIZATION

## 2017-08-30 LAB — T3: T3, Total: 103 ng/dL (ref 76–181)

## 2017-08-30 LAB — FRUCTOSAMINE: Fructosamine: 323 umol/L — ABNORMAL HIGH (ref 190–270)

## 2017-08-31 ENCOUNTER — Telehealth: Payer: Self-pay | Admitting: Family Medicine

## 2017-08-31 ENCOUNTER — Other Ambulatory Visit: Payer: Self-pay | Admitting: Family Medicine

## 2017-08-31 NOTE — Telephone Encounter (Signed)
Started PA for diclofenac sod 1% gel, key:  [id number], PA case ID:  [id number], Rx #:  [id number].    PA approved through 07/02/18.  Spoke with [facility] informing them of approval. Says they will fill rx for pt.

## 2017-08-31 NOTE — Telephone Encounter (Signed)
Copied from [place] [phone]. Topic: Quick Communication - See Telephone Encounter >> Aug 31, 2017  9:35 AM Boyd Kerbs wrote: CRM for notification. See Telephone encounter for:   Nollie at [facility] [phone] for prescription diclofenac sodium (VOLTAREN) 1 % GEL , is saying needs PA for insurance  08/31/17.

## 2017-09-01 ENCOUNTER — Other Ambulatory Visit: Payer: Self-pay | Admitting: Family Medicine

## 2017-09-01 DIAGNOSIS — E1165 Type 2 diabetes mellitus with hyperglycemia: Principal | ICD-10-CM

## 2017-09-01 DIAGNOSIS — E1129 Type 2 diabetes mellitus with other diabetic kidney complication: Secondary | ICD-10-CM

## 2017-09-01 DIAGNOSIS — IMO0002 Reserved for concepts with insufficient information to code with codable children: Secondary | ICD-10-CM

## 2017-09-01 MED ORDER — LEVOTHYROXINE SODIUM 88 MCG PO TABS
88.0000 ug | ORAL_TABLET | Freq: Every day | ORAL | 1 refills | Status: DC
Start: 2017-09-01 — End: 2018-03-06

## 2017-09-10 ENCOUNTER — Ambulatory Visit (INDEPENDENT_AMBULATORY_CARE_PROVIDER_SITE_OTHER): Payer: Medicare Other

## 2017-09-10 DIAGNOSIS — R42 Dizziness and giddiness: Secondary | ICD-10-CM | POA: Diagnosis not present

## 2017-09-10 DIAGNOSIS — I6529 Occlusion and stenosis of unspecified carotid artery: Secondary | ICD-10-CM | POA: Diagnosis not present

## 2017-09-13 DIAGNOSIS — E113211 Type 2 diabetes mellitus with mild nonproliferative diabetic retinopathy with macular edema, right eye: Secondary | ICD-10-CM | POA: Diagnosis not present

## 2017-09-13 DIAGNOSIS — E113212 Type 2 diabetes mellitus with mild nonproliferative diabetic retinopathy with macular edema, left eye: Secondary | ICD-10-CM | POA: Diagnosis not present

## 2017-09-17 ENCOUNTER — Other Ambulatory Visit: Payer: Self-pay | Admitting: Family Medicine

## 2017-09-24 ENCOUNTER — Telehealth: Payer: Self-pay

## 2017-09-24 NOTE — Telephone Encounter (Signed)
left message for patient to call Alexis Tucker back in regards to [employer], RMA

## 2017-09-25 ENCOUNTER — Ambulatory Visit
Admission: RE | Admit: 2017-09-25 | Discharge: 2017-09-25 | Disposition: A | Discharge status: Home / Self Care | Payer: Medicare Other | Source: Ambulatory Visit | Attending: Family Medicine | Admitting: Family Medicine

## 2017-09-25 ENCOUNTER — Encounter: Payer: Self-pay | Admitting: Family Medicine

## 2017-09-25 DIAGNOSIS — Z853 Personal history of malignant neoplasm of breast: Secondary | ICD-10-CM | POA: Insufficient documentation

## 2017-09-25 DIAGNOSIS — E2839 Other primary ovarian failure: Secondary | ICD-10-CM | POA: Diagnosis not present

## 2017-09-25 DIAGNOSIS — M810 Age-related osteoporosis without current pathological fracture: Secondary | ICD-10-CM | POA: Diagnosis not present

## 2017-09-25 DIAGNOSIS — Z1231 Encounter for screening mammogram for malignant neoplasm of breast: Secondary | ICD-10-CM | POA: Diagnosis not present

## 2017-09-25 DIAGNOSIS — Z1239 Encounter for other screening for malignant neoplasm of breast: Secondary | ICD-10-CM

## 2017-09-25 HISTORY — DX: Malignant neoplasm of unspecified site of unspecified female breast: C50.919

## 2017-09-25 HISTORY — DX: Personal history of irradiation: Z92.3

## 2017-09-25 LAB — HM DEXA SCAN

## 2017-09-27 ENCOUNTER — Other Ambulatory Visit: Payer: Self-pay | Admitting: Family Medicine

## 2017-09-27 ENCOUNTER — Other Ambulatory Visit: Payer: Self-pay | Admitting: Internal Medicine

## 2017-09-27 DIAGNOSIS — N6320 Unspecified lump in the left breast, unspecified quadrant: Secondary | ICD-10-CM

## 2017-09-27 DIAGNOSIS — R928 Other abnormal and inconclusive findings on diagnostic imaging of breast: Secondary | ICD-10-CM

## 2017-09-29 ENCOUNTER — Encounter: Payer: Self-pay | Admitting: Family Medicine

## 2017-09-29 DIAGNOSIS — M810 Age-related osteoporosis without current pathological fracture: Secondary | ICD-10-CM | POA: Insufficient documentation

## 2017-10-02 ENCOUNTER — Encounter: Payer: Self-pay | Admitting: Family Medicine

## 2017-10-09 ENCOUNTER — Telehealth: Payer: Self-pay

## 2017-10-09 NOTE — Telephone Encounter (Signed)
Left message for patient or her daughter karen to call [place] back in regards to a referral-Ruth Tully V Brannen Koppen, RMA

## 2017-10-11 DIAGNOSIS — E113211 Type 2 diabetes mellitus with mild nonproliferative diabetic retinopathy with macular edema, right eye: Secondary | ICD-10-CM | POA: Diagnosis not present

## 2017-10-11 DIAGNOSIS — E113212 Type 2 diabetes mellitus with mild nonproliferative diabetic retinopathy with macular edema, left eye: Secondary | ICD-10-CM | POA: Diagnosis not present

## 2017-10-18 DIAGNOSIS — H401133 Primary open-angle glaucoma, bilateral, severe stage: Secondary | ICD-10-CM | POA: Diagnosis not present

## 2017-10-26 ENCOUNTER — Telehealth: Payer: Self-pay | Admitting: Family Medicine

## 2017-10-26 NOTE — Telephone Encounter (Signed)
Pt called and stated that her daughter told her she had 2 messages from the office. Pt stated that she does not want [place] to notify her daughter regarding anything at all for her care. She requested that I remove her daughter and granddaughter from her emergency contacts. She said that she will call back with information for an updated emergency contact but we should not disclose information of any kind to her daughter.   Copied from [place] #[id number]. Topic: Quick Communication - Office Called Patient >> Oct 26, 2017  8:23 AM Ramond Craver B wrote: Reason for CRM: left message with daughter Santiago Glad to call the office please transfer to [place] [facility] [place]

## 2017-10-27 ENCOUNTER — Other Ambulatory Visit: Payer: Self-pay | Admitting: Family Medicine

## 2017-10-29 NOTE — Telephone Encounter (Signed)
Electronic refill request Last refill 08/27/17 1 tube/1 refill Last office visit 08/27/17

## 2017-11-02 ENCOUNTER — Ambulatory Visit
Admission: RE | Admit: 2017-11-02 | Discharge: 2017-11-02 | Disposition: A | Discharge status: Home / Self Care | Payer: Medicare Other | Source: Ambulatory Visit | Attending: Family Medicine | Admitting: Family Medicine

## 2017-11-02 DIAGNOSIS — I899 Noninfective disorder of lymphatic vessels and lymph nodes, unspecified: Secondary | ICD-10-CM | POA: Diagnosis not present

## 2017-11-02 DIAGNOSIS — N6321 Unspecified lump in the left breast, upper outer quadrant: Secondary | ICD-10-CM | POA: Diagnosis not present

## 2017-11-02 DIAGNOSIS — R928 Other abnormal and inconclusive findings on diagnostic imaging of breast: Secondary | ICD-10-CM

## 2017-11-02 DIAGNOSIS — N6320 Unspecified lump in the left breast, unspecified quadrant: Secondary | ICD-10-CM

## 2017-11-02 DIAGNOSIS — N6489 Other specified disorders of breast: Secondary | ICD-10-CM | POA: Diagnosis not present

## 2017-11-05 ENCOUNTER — Ambulatory Visit: Payer: Medicare Other

## 2017-11-05 ENCOUNTER — Other Ambulatory Visit: Payer: Self-pay | Admitting: Family Medicine

## 2017-11-05 DIAGNOSIS — R928 Other abnormal and inconclusive findings on diagnostic imaging of breast: Secondary | ICD-10-CM

## 2017-11-05 DIAGNOSIS — N6320 Unspecified lump in the left breast, unspecified quadrant: Secondary | ICD-10-CM

## 2017-11-08 ENCOUNTER — Ambulatory Visit: Payer: Medicare Other | Admitting: Family Medicine

## 2017-11-08 ENCOUNTER — Other Ambulatory Visit: Payer: Self-pay | Admitting: Family Medicine

## 2017-11-08 DIAGNOSIS — E113213 Type 2 diabetes mellitus with mild nonproliferative diabetic retinopathy with macular edema, bilateral: Secondary | ICD-10-CM | POA: Diagnosis not present

## 2017-11-14 ENCOUNTER — Encounter: Payer: Medicare Other | Admitting: Family Medicine

## 2017-11-14 DIAGNOSIS — I129 Hypertensive chronic kidney disease with stage 1 through stage 4 chronic kidney disease, or unspecified chronic kidney disease: Secondary | ICD-10-CM | POA: Diagnosis not present

## 2017-11-14 DIAGNOSIS — N185 Chronic kidney disease, stage 5: Secondary | ICD-10-CM | POA: Diagnosis not present

## 2017-11-14 DIAGNOSIS — I120 Hypertensive chronic kidney disease with stage 5 chronic kidney disease or end stage renal disease: Secondary | ICD-10-CM | POA: Diagnosis not present

## 2017-11-14 DIAGNOSIS — E1129 Type 2 diabetes mellitus with other diabetic kidney complication: Secondary | ICD-10-CM | POA: Diagnosis not present

## 2017-11-14 DIAGNOSIS — N2581 Secondary hyperparathyroidism of renal origin: Secondary | ICD-10-CM | POA: Diagnosis not present

## 2017-11-21 ENCOUNTER — Ambulatory Visit
Admission: RE | Admit: 2017-11-21 | Discharge: 2017-11-21 | Disposition: A | Discharge status: Home / Self Care | Payer: Medicare Other | Source: Ambulatory Visit | Attending: Family Medicine | Admitting: Family Medicine

## 2017-11-21 DIAGNOSIS — N6320 Unspecified lump in the left breast, unspecified quadrant: Secondary | ICD-10-CM | POA: Insufficient documentation

## 2017-11-21 DIAGNOSIS — R928 Other abnormal and inconclusive findings on diagnostic imaging of breast: Secondary | ICD-10-CM | POA: Insufficient documentation

## 2017-11-21 DIAGNOSIS — R590 Localized enlarged lymph nodes: Secondary | ICD-10-CM | POA: Diagnosis not present

## 2017-11-21 DIAGNOSIS — N6321 Unspecified lump in the left breast, upper outer quadrant: Secondary | ICD-10-CM | POA: Diagnosis not present

## 2017-11-21 DIAGNOSIS — C50412 Malignant neoplasm of upper-outer quadrant of left female breast: Secondary | ICD-10-CM | POA: Diagnosis not present

## 2017-11-22 HISTORY — PX: BREAST BIOPSY: SHX20

## 2017-11-27 ENCOUNTER — Encounter: Payer: Self-pay | Admitting: General Surgery

## 2017-11-27 ENCOUNTER — Other Ambulatory Visit: Payer: Self-pay

## 2017-11-27 DIAGNOSIS — Z170 Estrogen receptor positive status [ER+]: Secondary | ICD-10-CM

## 2017-11-27 DIAGNOSIS — C50919 Malignant neoplasm of unspecified site of unspecified female breast: Secondary | ICD-10-CM

## 2017-11-27 DIAGNOSIS — C50412 Malignant neoplasm of upper-outer quadrant of left female breast: Secondary | ICD-10-CM

## 2017-11-27 NOTE — Progress Notes (Signed)
  Oncology Nurse Navigator Documentation  Navigator Location: [facility] (11/27/17 1300)   )Navigator Encounter Type: Introductory phone call;Education (11/27/17 1300)                     Patient Visit Type: Initial (11/27/17 1300)   Barriers/Navigation Needs: Coordination of Care;Education (11/27/17 1300) Education: Accessing Care/ Finding Providers;Understanding Cancer/ Treatment Options;Coping with Diagnosis/ Prognosis;Newly Diagnosed Cancer Education (11/27/17 1300) Interventions: Coordination of Care;Education (11/27/17 1300)   Coordination of Care: Appts (11/27/17 1300) Education Method: Verbal (11/27/17 1300)                Time Spent with Patient: 60 (11/27/17 1300)   Introduced IT trainer. She will receive Breast Cancer Treatment Handbook/folder with hospital services.  Patient scheduledon 12/03/17  with Med/Onc Dr. Rogue Bussing  at 3:00 and  Surgeon Dr. Bary Castilla at 4:00.  Mailed appointment information to patient.  Notified Dr. Danise Mina of appointments.  Patient has history of left breast cancer treated with lumpectomy, mammosite, and Arimedex.  Patient reports she was living in [place]. at that time.  She lives with her daughter.  States her daughter has to drive her to appointments due to patient's having macular edema.  She prefers late afternoon appointments for this reason.  Patient worked in Engineer, technical sales for [employer].

## 2017-11-28 ENCOUNTER — Encounter: Payer: Self-pay | Admitting: *Deleted

## 2017-11-29 ENCOUNTER — Other Ambulatory Visit: Payer: Self-pay

## 2017-12-03 ENCOUNTER — Telehealth: Payer: Self-pay | Admitting: Internal Medicine

## 2017-12-03 ENCOUNTER — Encounter: Payer: Self-pay | Admitting: Internal Medicine

## 2017-12-03 ENCOUNTER — Ambulatory Visit (INDEPENDENT_AMBULATORY_CARE_PROVIDER_SITE_OTHER): Payer: Medicare Other | Admitting: General Surgery

## 2017-12-03 ENCOUNTER — Inpatient Hospital Stay: Payer: Medicare Other | Attending: Internal Medicine | Admitting: Internal Medicine

## 2017-12-03 ENCOUNTER — Other Ambulatory Visit: Payer: Self-pay

## 2017-12-03 ENCOUNTER — Encounter: Payer: Self-pay | Admitting: General Surgery

## 2017-12-03 VITALS — BP 176/94 | HR 63 | Resp 11 | Ht 63.0 in | Wt 216.0 lb

## 2017-12-03 DIAGNOSIS — I129 Hypertensive chronic kidney disease with stage 1 through stage 4 chronic kidney disease, or unspecified chronic kidney disease: Secondary | ICD-10-CM | POA: Diagnosis not present

## 2017-12-03 DIAGNOSIS — E1122 Type 2 diabetes mellitus with diabetic chronic kidney disease: Secondary | ICD-10-CM | POA: Insufficient documentation

## 2017-12-03 DIAGNOSIS — D649 Anemia, unspecified: Secondary | ICD-10-CM | POA: Insufficient documentation

## 2017-12-03 DIAGNOSIS — D696 Thrombocytopenia, unspecified: Secondary | ICD-10-CM | POA: Diagnosis not present

## 2017-12-03 DIAGNOSIS — N184 Chronic kidney disease, stage 4 (severe): Secondary | ICD-10-CM | POA: Diagnosis not present

## 2017-12-03 DIAGNOSIS — Z170 Estrogen receptor positive status [ER+]: Secondary | ICD-10-CM | POA: Insufficient documentation

## 2017-12-03 DIAGNOSIS — C50412 Malignant neoplasm of upper-outer quadrant of left female breast: Secondary | ICD-10-CM | POA: Diagnosis not present

## 2017-12-03 DIAGNOSIS — I6523 Occlusion and stenosis of bilateral carotid arteries: Secondary | ICD-10-CM

## 2017-12-03 DIAGNOSIS — Z79899 Other long term (current) drug therapy: Secondary | ICD-10-CM | POA: Diagnosis not present

## 2017-12-03 NOTE — Progress Notes (Signed)
  Oncology Nurse Navigator Documentation  Navigator Location: [facility] (12/03/17 1700) Referral date to RadOnc/MedOnc: 12/03/17 (12/03/17 1700) )Navigator Encounter Type: Initial MedOnc (12/03/17 1700)                     Patient Visit Type: Follow-up;MedOnc (12/03/17 1700)   Barriers/Navigation Needs: Education;Coordination of Care;Family concerns (12/03/17 1700) Education: Understanding Cancer/ Treatment Options;Coping with Diagnosis/ Prognosis;Newly Diagnosed Cancer Education (12/03/17 1700) Interventions: Coordination of Care;Education (12/03/17 1700)   Coordination of Care: Appts (12/03/17 1700) Education Method: Written (12/03/17 1700)                Time Spent with Patient: 60 (12/03/17 1700) Supported patient ,and daughter Santiago Glad at Initial consult with Dr. Rogue Bussing.

## 2017-12-03 NOTE — Progress Notes (Signed)
Patient ID: Alexis Tucker, female   DOB: 1940-07-29, 77 y.o.   MRN: [id number]  Chief Complaint  Patient presents with  . Breast Cancer    HPI Alexis Tucker is a 77 y.o. female here to discuss the results of her breast biopsy. Her mammogram was on 09/25/17. She had added views on 11/02/17 of the left breast. She had a biopsy of the left breast on 11/21/17. She had noticed a bump on her left breast in December prior to the mammogram in March. She has a history of colon cancer in 2005, and left breast cancer in 2008. She has been on Arimidex since her diagnosis in 2008. She is here today with Alexis Tucker, her daughter.  HPI  Past Medical History:  Diagnosis Date  . Anemia    per prior pcp  . Breast cancer ([place]) 2008   F/U radiation   . Breast cancer ([place]) 11/27/2017   left breast INVASIVE MAMMARY CARCINOMA  . Carotid stenosis, asymptomatic 01/2014   mild ICA, severe in ECA, rec rpt [place] 2 years  . Cataracts, bilateral   . CKD (chronic kidney disease) stage 4, GFR 15-29 ml/min (H[facility]) 01/2012   Cr 3.85, in 3s since 2010, discussing HD and L forearm graft ([place])  . Diabetes type 2, controlled ([place]) 2000s  . Diabetic retinopathy with macular edema ([place]) 03/2014   [place] at [facility] eye on avastin  . DJD (degenerative joint disease)    per prior pcp  . GERD (gastroesophageal reflux disease)   . Glaucoma 03/2014  . History of breast cancer 2008   infiltrating ductal carcinoma s/p mammosite  . History of colon cancer 2006   s/p colectomy  . History of hepatitis A 1990s   from food, resolved  . HTN (hypertension)   . Macular degeneration    wet R with vision loss, dry L; to see retina specialist  . Neuropathy    per prior pcp  . OA (osteoarthritis)    knees and hips  . Osteonecrosis (HCC)    hips?  . Personal history of radiation therapy 2008   F/U Left Breast Cancer  . PONV (postoperative nausea and vomiting)   . Post-surgical hypothyroidism   . Wears dentures    partial upper     Past Surgical History:  Procedure Laterality Date  . ABDOMINAL HYSTERECTOMY  1976   partial, after tubal pregnancy, h/o fibroids with heavy bleeding  . BREAST BIOPSY Left 2008   positive  . BREAST BIOPSY Left 11/22/2017   2oclock 2cmfn wing shaped clip, INVASIVE MAMMARY CARCINOMA  . BREAST BIOPSY Left 11/22/2017   2oclock 4cmfn coil shaped clip, INVASIVE MAMMARY CARCINOMA  . BREAST BIOPSY Left 11/22/2017   lymph node - butterfly hydroMARK  . BREAST LUMPECTOMY Left 2008   F/U radiation  . BREAST MAMMOSITE  2008  . carotid ultrasound  12/2012   1-39% bilateral stenosis, severe in ECA  . CATARACT EXTRACTION W/PHACO Left 08/14/2016   Procedure: CATARACT EXTRACTION PHACO AND INTRAOCULAR LENS PLACEMENT (IOC)  Left Diabetic;  Surgeon: Ronnell Freshwater, MD;  Location: [facility];  Service: Ophthalmology;  Laterality: Left;  Diabetic - oral meds  . CHOLECYSTECTOMY  2005  . COLECTOMY  2005   colon cancer  . COLON SURGERY  2006   bowel obstruction  . COLONOSCOPY  2008   ext hem, ileo-colonic anastomosis in cecum, tubular adenoma x1, rec rpt 1 yr for multiple polyps (in [place])  . ESOPHAGOGASTRODUODENOSCOPY N/A 05/06/2016   Procedure: ESOPHAGOGASTRODUODENOSCOPY (EGD);  Surgeon: Manya Silvas, MD;  Location: [facility] ENDOSCOPY;  Service: Endoscopy;  Laterality: N/A;  . THYROIDECTOMY, PARTIAL     unclear why  . TOTAL KNEE ARTHROPLASTY Right 1999  . US ECHOCARDIOGRAPHY  11/2007   nl LV fxn EF 01%, diastolic dysfunction, LVH, tr TR and MR, slcerotic aortic valve    Family History  Problem Relation Age of Onset  . Diabetes Mother   . Hypertension Mother   . Arthritis Mother   . Alcohol abuse Father   . Arthritis Father   . Stroke Daughter   . CAD Neg Hx   . Cancer Neg Hx   . Breast cancer Neg Hx     Social History Social History   Tobacco Use  . Smoking status: Former Research scientist (life sciences)  . Smokeless tobacco: Never Used  . Tobacco comment: quit over 40 yrs ago  Substance Use  Topics  . Alcohol use: No    Comment: rare  . Drug use: No    Allergies  Allergen Reactions  . Tramadol Other (See Comments)    nightmares  . Codeine Anxiety  . Sulfa Antibiotics Itching and Rash    Current Outpatient Medications  Medication Sig Dispense Refill  . acetaminophen (TYLENOL) 325 MG tablet Take 1 tablet (325 mg total) by mouth every 6 (six) hours as needed for mild pain (or Fever >/= 101).    . Acetylcysteine 600 MG CAPS Take by mouth daily.    Marland Kitchen allopurinol (ZYLOPRIM) 100 MG tablet TAKE 1 TABLET BY MOUTH DAILY 90 tablet 0  . anastrozole (ARIMIDEX) 1 MG tablet Take 1 tablet (1 mg total) by mouth daily. 90 tablet 1  . aspirin EC 81 MG EC tablet Take 1 tablet (81 mg total) by mouth daily. 30 tablet 12  . Blood Glucose Monitoring Suppl (ACURA BLOOD GLUCOSE METER) W/DEVICE KIT Use as directed to check sugars daily 250.42 1 kit 0  . Calcium-Magnesium-Vitamin D (CALCIUM MAGNESIUM PO) Take 1 tablet by mouth daily.    . carvedilol (COREG) 25 MG tablet Take 1 tablet (25 mg total) by mouth daily. 90 tablet 1  . Cholecalciferol (VITAMIN D-3) 5000 UNITS TABS Take 1 tablet by mouth daily.    . diclofenac sodium (VOLTAREN) 1 % GEL APPLY 1 APPLICATION TOPICALLY 3 (THREE) TIMES DAILY. 100 g 0  . ferrous sulfate (SLOW IRON) 160 (50 Fe) MG TBCR SR tablet Take 1 tablet (160 mg total) by mouth daily. 90 each 3  . Folic Acid-Vit U9-NAT F57 (FOLBEE) 2.5-25-1 MG TABS tablet Take 1 tablet by mouth daily.    . isosorbide dinitrate (ISORDIL) 20 MG tablet Take 1 tablet (20 mg total) by mouth daily. 90 tablet 1  . latanoprost (XALATAN) 0.005 % ophthalmic solution Place 1 drop into both eyes at bedtime.    Marland Kitchen levothyroxine (SYNTHROID, LEVOTHROID) 88 MCG tablet Take 1 tablet (88 mcg total) by mouth daily. First week take 1/2 tablet daily then increase to 1 tablet daily 90 tablet 1  . pantoprazole (PROTONIX) 40 MG tablet Take 1 tablet (40 mg total) by mouth daily as needed. 90 tablet 1  . pentoxifylline  (TRENTAL) 400 MG CR tablet Take 1 tablet (400 mg total) by mouth daily. 90 tablet 1  . promethazine (PHENERGAN) 25 MG tablet Take 1 tablet (25 mg total) by mouth every 6 (six) hours as needed for nausea or vomiting. 30 tablet 0  . saxagliptin HCl (ONGLYZA) 2.5 MG TABS tablet Take 1 tablet (2.5 mg total) by mouth daily. 30 tablet  6   No current facility-administered medications for this visit.     Review of Systems Review of Systems  Constitutional: Negative.   Respiratory: Negative.   Cardiovascular: Negative.     Blood pressure (!) 176/94, pulse 63, resp. rate 11, height 5' 3" (1.6 m), weight 216 lb (98 kg).  Physical Exam Physical Exam  Constitutional: She is oriented to person, place, and time. She appears well-developed and well-nourished.  Eyes: Conjunctivae are normal. No scleral icterus.  Neck: Neck supple.  Cardiovascular: Normal rate, regular rhythm and normal heart sounds.  Pulmonary/Chest: Effort normal and breath sounds normal. Right breast exhibits mass (3 cm nodule at site of old treatment.  Mass at 2 o'clk 4 cm from the nipple ). Right breast exhibits no inverted nipple, no nipple discharge, no skin change and no tenderness. Left breast exhibits no inverted nipple, no mass, no nipple discharge, no skin change and no tenderness.    Lymphadenopathy:    She has no cervical adenopathy.    She has no axillary adenopathy.       Left: No supraclavicular adenopathy present.  Neurological: She is alert and oriented to person, place, and time.  Skin: Skin is warm and dry.  Psychiatric: She has a normal mood and affect.    Data Reviewed Screening mammogram dated September 26, 2017 was reviewed showing a new density in the upper outer quadrant of the left breast.  BI-RADS-0.  Subsequent diagnostic left breast mammogram and ultrasound dated Nov 02, 2017 was completed demonstrated 3 lobulated nodules in the upper outer quadrant.  Ultrasound suggested for solid masses primarily in the  2 o'clock position measuring up to 1.4 centimeters in diameter.  An axillary node with 3 mm of cortical thickness was reported as suspicious.   DIAGNOSIS:  A. 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   Size of invasive carcinoma: 12 mm in this sample  Histologic grade of invasive carcinoma: Grade 2            Glandular/tubular differentiation score: 3            Nuclear pleomorphism score: 2            Mitotic rate score: 1            Total score: 6  Ductal carcinoma in situ: Not identified  Lymphovascular invasion: Not identified    B. LEFT BREAST, 2:00, 4 CM FN; ULTRASOUND-GUIDED CORE BIOPSY:  - INVASIVE MAMMARY CARCINOMA, NO SPECIAL TYPE.   Size of invasive carcinoma: 7 mm in this sample  Histologic grade of invasive carcinoma: Grade 2            Glandular/tubular differentiation score: 3            Nuclear pleomorphism score: 2            Mitotic rate score: 1            Total score: 6  Ductal carcinoma in situ: Not identified  Lymphovascular invasion: Not identified   C. LYMPH NODE, LEFT AXILLARY; ULTRASOUND-GUIDED CORE BIOPSY:  - NO TUMOR SEEN IN LYMPH NODE CORE FRAGMENTS.   BREAST BIOMARKER TESTS  Estrogen Receptor (ER) Status: POSITIVE  Percentage of cells with nuclear positivity: 90%  Average intensity of staining: Strong   Progesterone Receptor (PgR) Status: POSITIVE  Percentage of cells with nuclear positivity: 10%  Average intensity of staining: Moderate   HER2 (by immunohistochemistry): EQUIVOCAL  Pathology from the October 04, 2006 wide excision and sentinel node/axillary dissection was reviewed.  This described a 2.3 cm intermediate grade invasive mammary carcinoma with 0 of 8 nodes positive.  Patient subsequently underwent accelerated partial breast radiation.  No additional treatment outside of  antiestrogen therapy administered.  ER/PR reported as ordered.  Hemoglobin A1c of August 27, 2017 was normal at 6.2.  Renal function panel of the same date showed a creatinine of 4.48 with an estimated GFR of 12.3.  Potassium elevated 5.2.  Chloride elevated 113.  Normal albumin at 3.9.  May 05, 2016 renal panel showed a creatinine of 3.6 with an estimated GFR of 13.  CBC of August 17, 2017 shows a hemoglobin of 11.5 with an MCV of 93, white blood cell count of 4900.  Platelet count of 122,000.  Head CT completed for dizziness and headache on August 17, 2017: IMPRESSION: No acute abnormality.  Atrophy, extensive chronic microvascular ischemic change and remote parietotemporal infarct.  Extensive atherosclerosis.   Assessment.   Recurrent, multifocal cancer involving the upper outer quadrant of the left breast.     Plan    The patient and her daughter had previously met with Dr. Rogue Bussing earlier in the day.  Options for management were reviewed, ideally, with prior malignancy and at least 2 foci of documented malignancy in the upper outer quadrant of the left breast mastectomy would be appropriate.  The patient is cool to the idea of mastectomy at this time, and is leaning towards reexcision with the possibility of repeat radiation therapy by MammoSite or potentially whole breast radiation.  Reviewing the ultrasound showing multiple defects these are all within a fairly close area and could likely be encompassed with a wide excision.  As this is again in the upper outer quadrant the potential for a significant cosmetic deformity was discussed with both the patient and her daughter.  If she decides against mastectomy as primary treatment, the next question would be that where she to undertake breast conservation if such as significant cosmetic defect would develop, anticipating it to worsen with ongoing radiation therapy, which she be amenable to mastectomy at that time or  desire a return visit and return to surgery after the decision for completion mastectomy was made.  The patient has multiple medical comorbidities, none of which preclude attempts at repeat excision of the tumor.  Considering the multiple nodes removed at the time of her original cancer in 2008 as well as the dense scarring in the upper outer quadrant from her accelerated partial breast radiation  sentinel node biopsy may be difficult.  She may well be a candidate for completion axillary dissection and had a minimum complete removal of the lymph node that had caught the radiologist eye and undergone a normal biopsy.     HPI, Physical Exam, Assessment and Plan have been scribed under the direction and in the presence of Robert Bellow, MD  Alexis Living, Alexis Tucker  I have completed the exam and reviewed the above documentation for accuracy and completeness.  I agree with the above.  Haematologist has been used and any errors in dictation or transcription are unintentional.  Hervey Ard, M.D., F.A.C.S.   Alexis Tucker 12/03/2017, 9:42 PM

## 2017-12-03 NOTE — Telephone Encounter (Signed)
Entered in error by MD

## 2017-12-03 NOTE — Progress Notes (Signed)
Patient here for initial visit. Patients BP is elevated today. She states she monitors it at home and it is usually WNL.

## 2017-12-03 NOTE — Progress Notes (Signed)
[facility] NOTE  Patient Care Team: Ria Bush, MD as PCP - General (Family Medicine)  CHIEF COMPLAINTS/PURPOSE OF CONSULTATION:  Breast cancer  #  Oncology History   # 2008- Left breast ductal ca- [T=2.3 cm; N= 0/8; ]mammosite;[facility]; DC No chemo; On arimidex  # MAY 2019- LEFT BREAST 2'O clock- mammo/US- 4 clusters [spanning 6 cm] 2 nodules- Bx- IMC; ER/PR- POS; Her 2 Neu-Pending. LN Bx- NEG    # CKD stage IV [Dr.Singh]; DM; macular edema [injections into eye]; Hx of colon ca     Malignant neoplasm of upper-outer quadrant of left breast in female, estrogen receptor positive ([place])   12/03/2017 Initial Diagnosis    Carcinoma of upper-outer quadrant of left breast in female, estrogen receptor positive ([place])        HISTORY OF PRESENTING ILLNESS:  Alexis Tucker 77 y.o.  female history of breast cancer in 2008-has been referred to [place] for further evaluation recommendations for new diagnosis of left-sided breast cancer.  Patient's previous breast cancer diagnosis/pathology/treatment reviewed in detail-summary as above.  Patient noted to have a lump left breast approximately 6 months ago.  She has not noted significant growth in size of the lump over time.  This subsequently was brought to the attention to PCP; and worked up with the mammogram/ultrasound/biopsy.  Mammogram/ultrasound showed-up to 4 clusters/nodules in the 2 o'clock position left breast-2 of which have been biopsied positive for invasive ductal carcinoma-ER PR positive HER-2/neu pending.   Patient has chronic joint pains; chronic back pain.  This is not any worse.  Denies any unusual shortness of breath cough.   Review of Systems  Constitutional: Negative for chills, diaphoresis, fever, malaise/fatigue and weight loss.  HENT: Negative for nosebleeds and sore throat.   Eyes: Negative for double vision.  Respiratory: Negative for cough, hemoptysis, sputum production, shortness of breath  and wheezing.   Cardiovascular: Negative for chest pain, palpitations, orthopnea and leg swelling.  Gastrointestinal: Negative for abdominal pain, blood in stool, constipation, diarrhea, heartburn, melena, nausea and vomiting.  Genitourinary: Negative for dysuria, frequency and urgency.  Musculoskeletal: Positive for back pain and joint pain.  Skin: Negative.  Negative for itching and rash.  Neurological: Negative for dizziness, tingling, focal weakness, weakness and headaches.  Endo/Heme/Allergies: Does not bruise/bleed easily.  Psychiatric/Behavioral: Negative for depression. The patient is not nervous/anxious and does not have insomnia.      MEDICAL HISTORY:  Past Medical History:  Diagnosis Date  . Anemia    per prior pcp  . Breast cancer ([place]) 2008   F/U radiation   . Breast cancer ([place]) 11/27/2017   left breast INVASIVE MAMMARY CARCINOMA  . Carotid stenosis, asymptomatic 01/2014   mild ICA, severe in ECA, rec rpt [place] 2 years  . Cataracts, bilateral   . CKD (chronic kidney disease) stage 4, GFR 15-29 ml/min (HCC) 01/2012   Cr 3.85, in 3s since 2010, discussing HD and L forearm graft (Singh/[facility]chn[facility])  . Diabetes type 2, controlled ([place]) 2000s  . Diabetic retinopathy with macular edema ([place]) 03/2014   [place] at [facility] eye on avastin  . DJD (degenerative joint disease)    per prior pcp  . GERD (gastroesophageal reflux disease)   . Glaucoma 03/2014  . History of breast cancer 2008   infiltrating ductal carcinoma s/p mammosite  . History of colon cancer 2006   s/p colectomy  . History of hepatitis A 1990s   from food, resolved  . HTN (hypertension)   . Macular  degeneration    wet R with vision loss, dry L; to see retina specialist  . Neuropathy    per prior pcp  . OA (osteoarthritis)    knees and hips  . Osteonecrosis (HCC)    hips?  . Personal history of radiation therapy 2008   F/U Left Breast Cancer  . PONV (postoperative nausea and vomiting)   . Post-surgical  hypothyroidism   . Wears dentures    partial upper    SURGICAL HISTORY: Past Surgical History:  Procedure Laterality Date  . ABDOMINAL HYSTERECTOMY  1976   partial, after tubal pregnancy, h/o fibroids with heavy bleeding  . BREAST BIOPSY Left 2008   positive  . BREAST BIOPSY Left 11/22/2017   2oclock 2cmfn wing shaped clip, INVASIVE MAMMARY CARCINOMA  . BREAST BIOPSY Left 11/22/2017   2oclock 4cmfn coil shaped clip, INVASIVE MAMMARY CARCINOMA  . BREAST BIOPSY Left 11/22/2017   lymph node - butterfly hydroMARK  . BREAST LUMPECTOMY Left 2008   F/U radiation  . BREAST MAMMOSITE  2008  . carotid ultrasound  12/2012   1-39% bilateral stenosis, severe in ECA  . CATARACT EXTRACTION W/PHACO Left 08/14/2016   Procedure: CATARACT EXTRACTION PHACO AND INTRAOCULAR LENS PLACEMENT (IOC)  Left Diabetic;  Surgeon: Ronnell Freshwater, MD;  Location: [facility];  Service: Ophthalmology;  Laterality: Left;  Diabetic - oral meds  . CHOLECYSTECTOMY  2005  . COLECTOMY  2005   colon cancer  . COLON SURGERY  2006   bowel obstruction  . COLONOSCOPY  2008   ext hem, ileo-colonic anastomosis in cecum, tubular adenoma x1, rec rpt 1 yr for multiple polyps (in [place])  . ESOPHAGOGASTRODUODENOSCOPY N/A 05/06/2016   Procedure: ESOPHAGOGASTRODUODENOSCOPY (EGD);  Surgeon: Manya Silvas, MD;  Location[facility] ENDOSCOPY;  Service: Endoscopy;  Laterality: N/A;  . THYROIDECTOMY, PARTIAL     unclear why  . TOTAL KNEE ARTHROPLASTY Right 1999  . US ECHOCARDIOGRAPHY  11/2007   nl LV fxn EF 41%, diastolic dysfunction, LVH, tr TR and MR, slcerotic aortic valve    SOCIAL HISTORY: Social History   Socioeconomic History  . Marital status: Divorced    Spouse name: Not on file  . Number of children: Not on file  . Years of education: Not on file  . Highest education level: Not on file  Occupational History  . Not on file  Social Needs  . Financial resource strain: Not on file  . Food insecurity:     Worry: Not on file    Inability: Not on file  . Transportation needs:    Medical: Not on file    Non-medical: Not on file  Tobacco Use  . Smoking status: Former Research scientist (life sciences)  . Smokeless tobacco: Never Used  . Tobacco comment: quit over 40 yrs ago  Substance and Sexual Activity  . Alcohol use: No    Comment: rare  . Drug use: No  . Sexual activity: Not on file  Lifestyle  . Physical activity:    Days per week: Not on file    Minutes per session: Not on file  . Stress: Not on file  Relationships  . Social connections:    Talks on phone: Not on file    Gets together: Not on file    Attends religious service: Not on file    Active member of club or organization: Not on file    Attends meetings of clubs or organizations: Not on file    Relationship status: Not on file  .  Intimate partner violence:    Fear of current or ex partner: Not on file    Emotionally abused: Not on file    Physically abused: Not on file    Forced sexual activity: Not on file  Other Topics Concern  . Not on file  Social History Narrative   Lives alone, 1 dog.   Granddaughter in [place] (attorney).   Divorced   Occu: retired, worked in Engineer, technical sales   Edu: college    FAMILY HISTORY: Family History  Problem Relation Age of Onset  . Diabetes Mother   . Hypertension Mother   . Arthritis Mother   . Alcohol abuse Father   . Arthritis Father   . Stroke Daughter   . CAD Neg Hx   . Cancer Neg Hx   . Breast cancer Neg Hx     ALLERGIES:  is allergic to tramadol; codeine; and sulfa antibiotics.  MEDICATIONS:  Current Outpatient Medications  Medication Sig Dispense Refill  . acetaminophen (TYLENOL) 325 MG tablet Take 1 tablet (325 mg total) by mouth every 6 (six) hours as needed for mild pain (or Fever >/= 101).    Marland Kitchen allopurinol (ZYLOPRIM) 100 MG tablet TAKE 1 TABLET BY MOUTH DAILY 90 tablet 0  . anastrozole (ARIMIDEX) 1 MG tablet Take 1 tablet (1 mg total) by mouth daily. 90 tablet 1  . aspirin EC 81 MG EC tablet Take  1 tablet (81 mg total) by mouth daily. 30 tablet 12  . Blood Glucose Monitoring Suppl (ACURA BLOOD GLUCOSE METER) W/DEVICE KIT Use as directed to check sugars daily 250.42 1 kit 0  . Calcium-Magnesium-Vitamin D (CALCIUM MAGNESIUM PO) Take 1 tablet by mouth daily.    . carvedilol (COREG) 25 MG tablet Take 1 tablet (25 mg total) by mouth daily. 90 tablet 1  . Cholecalciferol (VITAMIN D-3) 5000 UNITS TABS Take 1 tablet by mouth daily.    . diclofenac sodium (VOLTAREN) 1 % GEL APPLY 1 APPLICATION TOPICALLY 3 (THREE) TIMES DAILY. 100 g 0  . ferrous sulfate (SLOW IRON) 160 (50 Fe) MG TBCR SR tablet Take 1 tablet (160 mg total) by mouth daily. 90 each 3  . Folic Acid-Vit G9-JME Q68 (FOLBEE) 2.5-25-1 MG TABS tablet Take 1 tablet by mouth daily.    . isosorbide dinitrate (ISORDIL) 20 MG tablet Take 1 tablet (20 mg total) by mouth daily. 90 tablet 1  . latanoprost (XALATAN) 0.005 % ophthalmic solution Place 1 drop into both eyes at bedtime.    Marland Kitchen levothyroxine (SYNTHROID, LEVOTHROID) 88 MCG tablet Take 1 tablet (88 mcg total) by mouth daily. First week take 1/2 tablet daily then increase to 1 tablet daily 90 tablet 1  . pantoprazole (PROTONIX) 40 MG tablet Take 1 tablet (40 mg total) by mouth daily as needed. 90 tablet 1  . pentoxifylline (TRENTAL) 400 MG CR tablet Take 1 tablet (400 mg total) by mouth daily. 90 tablet 1  . promethazine (PHENERGAN) 25 MG tablet Take 1 tablet (25 mg total) by mouth every 6 (six) hours as needed for nausea or vomiting. 30 tablet 0  . saxagliptin HCl (ONGLYZA) 2.5 MG TABS tablet Take 1 tablet (2.5 mg total) by mouth daily. 30 tablet 6  . Acetylcysteine 600 MG CAPS Take by mouth daily.     No current facility-administered medications for this visit.       Marland Kitchen  PHYSICAL EXAMINATION: ECOG PERFORMANCE STATUS: 0 - Asymptomatic  Vitals:   12/03/17 1505 12/03/17 1513  BP:  (!) 179/96  Pulse:  73  Resp: 16   Temp:  98.2 F (36.8 C)   Filed Weights   12/03/17 1505   Weight: 216 lb 4.8 oz (98.1 kg)    Physical Exam  Constitutional: She is oriented to person, place, and time and well-developed, well-nourished, and in no distress.  HENT:  Head: Normocephalic and atraumatic.  Mouth/Throat: Oropharynx is clear and moist. No oropharyngeal exudate.  Eyes: Pupils are equal, round, and reactive to light.  Neck: Normal range of motion. Neck supple.  Cardiovascular: Normal rate and regular rhythm.  Pulmonary/Chest: No respiratory distress. She has no wheezes.  Abdominal: Soft. Bowel sounds are normal. She exhibits no distension and no mass. There is no tenderness. There is no rebound and no guarding.  Musculoskeletal: Normal range of motion. She exhibits no edema or tenderness.  Neurological: She is alert and oriented to person, place, and time.  Skin: Skin is warm.  Psychiatric: Affect normal.   Left breast 2 o'clock position-hard nodules/mobile approximately 4 cm in size.  Scarring from left lumpectomy noted; no axillary adenopathy.  LABORATORY DATA:  I have reviewed the data as listed Lab Results  Component Value Date   WBC 4.9 08/17/2017   HGB 11.5 (L) 08/17/2017   HCT 36.0 08/17/2017   MCV 93.2 08/17/2017   PLT 122 (L) 08/17/2017   Recent Labs    08/17/17 1420 08/27/17 1537  NA 140 143  K 5.8* 5.2*  CL 114* 113*  CO2 16* 22  GLUCOSE 132* 161*  BUN 32* 31*  CREATININE 3.92* 4.48*  CALCIUM 9.0 9.8  GFRNONAA 10*  --   GFRAA 12*  --   ALBUMIN  --  3.9    RADIOGRAPHIC STUDIES: I have personally reviewed the radiological images as listed and agreed with the findings in the report. Mm Clip Placement Left  Result Date: 11/21/2017 CLINICAL DATA:  Post biopsy mammogram of the left breast for clip placement. EXAM: DIAGNOSTIC LEFT MAMMOGRAM POST ULTRASOUND BIOPSY COMPARISON:  Previous exam(s). FINDINGS: Mammographic images were obtained following ultrasound guided biopsy of 2 masses in the left breast and a left axillary lymph node. The wing  shaped biopsy marking clip is positioned at the site of the mass at 2 o'clock, 2 cm from the nipple. The coil shaped biopsy marking clip is in the mass at 2 o'clock, 4 cm from the nipple. The butterfly shaped HydroMARK clip is in the lymph node in the low left axilla. IMPRESSION: 1. Appropriate positioning of the wing shaped biopsy marking clip at 2 o'clock, 2 cm from the nipple. 2. Appropriate positioning of the coil shaped biopsy marking clip at 2 o'clock, 4 cm from the nipple. 3. Appropriate positioning of the butterfly shaped HydroMARK clip in the low left axillary lymph node. Final Assessment: Post Procedure Mammograms for Marker Placement Electronically Signed   By: Ammie Ferrier M.D.   On: 11/21/2017 14:30   [place] Lt Breast Bx W Loc Dev 1st Lesion Img Bx Spec US Guide  Addendum Date: 11/28/2017   ADDENDUM REPORT: 11/27/2017 17:25 ADDENDUM: Pathology of the left breast biopsy 2:00 o'clock position 2 cm from nipple revealed A. LEFT BREAST, 2:00, 2 CM FN; ULTRASOUND-GUIDED CORE BIOPSY: INVASIVE MAMMARY CARCINOMA, NO SPECIAL TYPE WITH NECROSIS. Size of invasive carcinoma: 12 mm in this sample. Histologic grade of invasive carcinoma: Grade 2. Ductal carcinoma in situ: Not identified. Lymphovascular invasion: Not identified. ER/PR/HER2: Immunohistochemistry will be performed on block A1, with reflex to [place] for HER2 2+. The results will be reported in  an addendum. Pathology of the left breast biopsy, 2:00 o'clock position 4 cm from nipple revealed B. LEFT BREAST, 2:00, 4 CM FN; ULTRASOUND-GUIDED CORE BIOPSY: INVASIVE MAMMARY CARCINOMA, NO SPECIAL TYPE. Size of invasive carcinoma: 7 mm in this sample. Histologic grade of invasive carcinoma: Grade 2. Ductal carcinoma in situ: Not identified. Lymphovascular invasion: Not identified. Pathology of the left axillary lymph node revealed C. LYMPH NODE, LEFT AXILLARY; ULTRASOUND-GUIDED CORE BIOPSY: NO TUMOR SEEN IN LYMPH NODE CORE FRAGMENTS. Comment: The tumors appear  similar in morphology. The biopsy 4 cm from the nipple shows a rim of lymphocytes around the tumor, which could represent replaced lymph node or a lymphocytic response to carcinoma. Correlation with imaging is recommended. The definitive grade will be assigned on the excisional specimen. The diagnosis was called to Aldona Bar of [facility] on 11/22/2017 at 3:20 PM. Read back procedure was performed. All three biopsies found to be concordant by Dr. Jetta Lout. Recommendation: Surgical and oncology referrals. At the patient's request, results and recommendations were relayed to the patient by phone by Dr. Jetta Lout on 11/23/16. The patient stated she did well following the biopsy. Post biopsy instructions were reviewed with the patient and all of her questions were answered. A request for referrals was relayed to Al Pimple, RN, nurse navigator for [facility] by [place], [place]. Appointments were made with Dr. Rogue Bussing, oncologist for 12/03/17 at 3:00 PM and Dr. Bary Castilla, surgeon for 12/03/17 at 4:00 PM. The patient has been notified of the appointments. Addendum by Jetta Lout, RRA on 11/27/17. Electronically Signed   By: Fidela Salisbury M.D.   On: 11/27/2017 17:25   Result Date: 11/28/2017 CLINICAL DATA:  77 year old female presenting for ultrasound-guided biopsy of 2 masses in the left breast and a left axillary lymph node. EXAM: ULTRASOUND GUIDED LEFT BREAST CORE NEEDLE BIOPSY COMPARISON:  Previous exam(s). FINDINGS: I met with the patient and we discussed the procedure of ultrasound-guided biopsy, including benefits and alternatives. We discussed the high likelihood of a successful procedure. We discussed the risks of the procedure, including infection, bleeding, tissue injury, clip migration, and inadequate sampling. Informed written consent was given. The usual time-out protocol was performed immediately prior to the procedure. Lesion quadrant: Upper-outer quadrant Using  sterile technique and 1% Lidocaine as local anesthetic, under direct ultrasound visualization, a 14 gauge spring-loaded device was used to perform biopsy of a mass in the left breast at 2 o'clock, 2 cm from the nipple using an inferior approach. At the conclusion of the procedure a wing shaped tissue marker clip was deployed into the biopsy cavity. Lesion quadrant: Upper-outer quadrant Using sterile technique and 1% Lidocaine as local anesthetic, under direct ultrasound visualization, a 14 gauge spring-loaded device was used to perform biopsy of a mass in the left breast at 2 o'clock, 4 cm from the nipple using an inferior approach. At the conclusion of the procedure a coil shaped tissue marker clip was deployed into the biopsy cavity. Lesion quadrant: Left axilla Using sterile technique and 1% Lidocaine as local anesthetic, under direct ultrasound visualization, a 14 gauge spring-loaded device was used to perform biopsy of a left axillary lymph node using an inferior approach. At the conclusion of the procedure a HydroMARK butterfly shaped tissue marker clip was deployed into the biopsy cavity. Follow up 2 view mammogram was performed and dictated separately. IMPRESSION: 1. Ultrasound guided biopsy of a mass in the left breast at 2 o'clock, 2 cm from the nipple (wing clip). No apparent  complications. 2. Ultrasound-guided biopsy of a left breast mass at 2 o'clock, 4 cm from the nipple (coil clip). No apparent complications. 3. Ultrasound-guided biopsy of a left axillary lymph node (HydroMARK butter 5 clip). No apparent complications. Electronically Signed: By: Ammie Ferrier M.D. On: 11/21/2017 14:27   [place] Lt Breast Bx W Loc Dev Ea Add Lesion Img Bx Spec US Guide  Addendum Date: 11/28/2017   ADDENDUM REPORT: 11/27/2017 17:25 ADDENDUM: Pathology of the left breast biopsy 2:00 o'clock position 2 cm from nipple revealed A. LEFT BREAST, 2:00, 2 CM FN; ULTRASOUND-GUIDED CORE BIOPSY: INVASIVE MAMMARY CARCINOMA, NO  SPECIAL TYPE WITH NECROSIS. Size of invasive carcinoma: 12 mm in this sample. Histologic grade of invasive carcinoma: Grade 2. Ductal carcinoma in situ: Not identified. Lymphovascular invasion: Not identified. ER/PR/HER2: Immunohistochemistry will be performed on block A1, with reflex to [place] for HER2 2+. The results will be reported in an addendum. Pathology of the left breast biopsy, 2:00 o'clock position 4 cm from nipple revealed B. LEFT BREAST, 2:00, 4 CM FN; ULTRASOUND-GUIDED CORE BIOPSY: INVASIVE MAMMARY CARCINOMA, NO SPECIAL TYPE. Size of invasive carcinoma: 7 mm in this sample. Histologic grade of invasive carcinoma: Grade 2. Ductal carcinoma in situ: Not identified. Lymphovascular invasion: Not identified. Pathology of the left axillary lymph node revealed C. LYMPH NODE, LEFT AXILLARY; ULTRASOUND-GUIDED CORE BIOPSY: NO TUMOR SEEN IN LYMPH NODE CORE FRAGMENTS. Comment: The tumors appear similar in morphology. The biopsy 4 cm from the nipple shows a rim of lymphocytes around the tumor, which could represent replaced lymph node or a lymphocytic response to carcinoma. Correlation with imaging is recommended. The definitive grade will be assigned on the excisional specimen. The diagnosis was called to Aldona Bar of [facility] on 11/22/2017 at 3:20 PM. Read back procedure was performed. All three biopsies found to be concordant by Dr. Jetta Lout. Recommendation: Surgical and oncology referrals. At the patient's request, results and recommendations were relayed to the patient by phone by Dr. Jetta Lout on 11/23/16. The patient stated she did well following the biopsy. Post biopsy instructions were reviewed with the patient and all of her questions were answered. A request for referrals was relayed to Al Pimple, RN, nurse navigator for [facility] by [place], [place]. Appointments were made with Dr. Rogue Bussing, oncologist for 12/03/17 at 3:00 PM and Dr. Bary Castilla, surgeon for 12/03/17  at 4:00 PM. The patient has been notified of the appointments. Addendum by Jetta Lout, RRA on 11/27/17. Electronically Signed   By: Fidela Salisbury M.D.   On: 11/27/2017 17:25   Result Date: 11/28/2017 CLINICAL DATA:  77 year old female presenting for ultrasound-guided biopsy of 2 masses in the left breast and a left axillary lymph node. EXAM: ULTRASOUND GUIDED LEFT BREAST CORE NEEDLE BIOPSY COMPARISON:  Previous exam(s). FINDINGS: I met with the patient and we discussed the procedure of ultrasound-guided biopsy, including benefits and alternatives. We discussed the high likelihood of a successful procedure. We discussed the risks of the procedure, including infection, bleeding, tissue injury, clip migration, and inadequate sampling. Informed written consent was given. The usual time-out protocol was performed immediately prior to the procedure. Lesion quadrant: Upper-outer quadrant Using sterile technique and 1% Lidocaine as local anesthetic, under direct ultrasound visualization, a 14 gauge spring-loaded device was used to perform biopsy of a mass in the left breast at 2 o'clock, 2 cm from the nipple using an inferior approach. At the conclusion of the procedure a wing shaped tissue marker clip was deployed into the biopsy  cavity. Lesion quadrant: Upper-outer quadrant Using sterile technique and 1% Lidocaine as local anesthetic, under direct ultrasound visualization, a 14 gauge spring-loaded device was used to perform biopsy of a mass in the left breast at 2 o'clock, 4 cm from the nipple using an inferior approach. At the conclusion of the procedure a coil shaped tissue marker clip was deployed into the biopsy cavity. Lesion quadrant: Left axilla Using sterile technique and 1% Lidocaine as local anesthetic, under direct ultrasound visualization, a 14 gauge spring-loaded device was used to perform biopsy of a left axillary lymph node using an inferior approach. At the conclusion of the procedure a HydroMARK  butterfly shaped tissue marker clip was deployed into the biopsy cavity. Follow up 2 view mammogram was performed and dictated separately. IMPRESSION: 1. Ultrasound guided biopsy of a mass in the left breast at 2 o'clock, 2 cm from the nipple (wing clip). No apparent complications. 2. Ultrasound-guided biopsy of a left breast mass at 2 o'clock, 4 cm from the nipple (coil clip). No apparent complications. 3. Ultrasound-guided biopsy of a left axillary lymph node (HydroMARK butter 5 clip). No apparent complications. Electronically Signed: By: Ammie Ferrier M.D. On: 11/21/2017 14:27   [place] Lt Breast Bx W Loc Dev Ea Add Lesion Img Bx Spec US Guide  Addendum Date: 11/28/2017   ADDENDUM REPORT: 11/27/2017 17:25 ADDENDUM: Pathology of the left breast biopsy 2:00 o'clock position 2 cm from nipple revealed A. LEFT BREAST, 2:00, 2 CM FN; ULTRASOUND-GUIDED CORE BIOPSY: INVASIVE MAMMARY CARCINOMA, NO SPECIAL TYPE WITH NECROSIS. Size of invasive carcinoma: 12 mm in this sample. Histologic grade of invasive carcinoma: Grade 2. Ductal carcinoma in situ: Not identified. Lymphovascular invasion: Not identified. ER/PR/HER2: Immunohistochemistry will be performed on block A1, with reflex to [place] for HER2 2+. The results will be reported in an addendum. Pathology of the left breast biopsy, 2:00 o'clock position 4 cm from nipple revealed B. LEFT BREAST, 2:00, 4 CM FN; ULTRASOUND-GUIDED CORE BIOPSY: INVASIVE MAMMARY CARCINOMA, NO SPECIAL TYPE. Size of invasive carcinoma: 7 mm in this sample. Histologic grade of invasive carcinoma: Grade 2. Ductal carcinoma in situ: Not identified. Lymphovascular invasion: Not identified. Pathology of the left axillary lymph node revealed C. LYMPH NODE, LEFT AXILLARY; ULTRASOUND-GUIDED CORE BIOPSY: NO TUMOR SEEN IN LYMPH NODE CORE FRAGMENTS. Comment: The tumors appear similar in morphology. The biopsy 4 cm from the nipple shows a rim of lymphocytes around the tumor, which could represent replaced  lymph node or a lymphocytic response to carcinoma. Correlation with imaging is recommended. The definitive grade will be assigned on the excisional specimen. The diagnosis was called to Aldona Bar of [facility] on 11/22/2017 at 3:20 PM. Read back procedure was performed. All three biopsies found to be concordant by Dr. Jetta Lout. Recommendation: Surgical and oncology referrals. At the patient's request, results and recommendations were relayed to the patient by phone by Dr. Jetta Lout on 11/23/16. The patient stated she did well following the biopsy. Post biopsy instructions were reviewed with the patient and all of her questions were answered. A request for referrals was relayed to Al Pimple, RN, nurse navigator for [facility] by [place], [place]. Appointments were made with Dr. Rogue Bussing, oncologist for 12/03/17 at 3:00 PM and Dr. Bary Castilla, surgeon for 12/03/17 at 4:00 PM. The patient has been notified of the appointments. Addendum by Jetta Lout, RRA on 11/27/17. Electronically Signed   By: Fidela Salisbury M.D.   On: 11/27/2017 17:25   Result Date: 11/28/2017 CLINICAL DATA:  77 year old female presenting for ultrasound-guided biopsy of 2 masses in the left breast and a left axillary lymph node. EXAM: ULTRASOUND GUIDED LEFT BREAST CORE NEEDLE BIOPSY COMPARISON:  Previous exam(s). FINDINGS: I met with the patient and we discussed the procedure of ultrasound-guided biopsy, including benefits and alternatives. We discussed the high likelihood of a successful procedure. We discussed the risks of the procedure, including infection, bleeding, tissue injury, clip migration, and inadequate sampling. Informed written consent was given. The usual time-out protocol was performed immediately prior to the procedure. Lesion quadrant: Upper-outer quadrant Using sterile technique and 1% Lidocaine as local anesthetic, under direct ultrasound visualization, a 14 gauge spring-loaded device was  used to perform biopsy of a mass in the left breast at 2 o'clock, 2 cm from the nipple using an inferior approach. At the conclusion of the procedure a wing shaped tissue marker clip was deployed into the biopsy cavity. Lesion quadrant: Upper-outer quadrant Using sterile technique and 1% Lidocaine as local anesthetic, under direct ultrasound visualization, a 14 gauge spring-loaded device was used to perform biopsy of a mass in the left breast at 2 o'clock, 4 cm from the nipple using an inferior approach. At the conclusion of the procedure a coil shaped tissue marker clip was deployed into the biopsy cavity. Lesion quadrant: Left axilla Using sterile technique and 1% Lidocaine as local anesthetic, under direct ultrasound visualization, a 14 gauge spring-loaded device was used to perform biopsy of a left axillary lymph node using an inferior approach. At the conclusion of the procedure a HydroMARK butterfly shaped tissue marker clip was deployed into the biopsy cavity. Follow up 2 view mammogram was performed and dictated separately. IMPRESSION: 1. Ultrasound guided biopsy of a mass in the left breast at 2 o'clock, 2 cm from the nipple (wing clip). No apparent complications. 2. Ultrasound-guided biopsy of a left breast mass at 2 o'clock, 4 cm from the nipple (coil clip). No apparent complications. 3. Ultrasound-guided biopsy of a left axillary lymph node (HydroMARK butter 5 clip). No apparent complications. Electronically Signed: By: Ammie Ferrier M.D. On: 11/21/2017 14:27    ASSESSMENT & PLAN:   Malignant neoplasm of upper-outer quadrant of left breast in female, estrogen receptor positive (HCC) #Left breast ER PR positive HER-2/neu-pending; 2 o'clock position-up to 4 clusters [spanning approximately 6 cm as per the radiology review]-but seems to be a new diagnosis; rather than recurrent of her prior breast cancer given the position.  Discussed the role of PET scan for staging; however would hold this of  until after definitive treatment/surgery.  #Reviewed the treatment options for her breast cancer in detail including-surgery; with lymph node evaluation.  Discussed options of lumpectomy with radiation versus mastectomy.  I would defer to discussion with Dr. Bary Castilla.  With mastectomy patient could potentially avoid radiation [especially given prior MammoSite]  #Reviewed the role of adjuvant chemotherapy; however given patient's borderline/stage IV kidney disease/borderline functional status-I would recommend holding off chemotherapy-unless aggressive features [like HER-2/neu positivity] shows up on final pathology.   #Given the ER/PR positive disease-I reviewed the importance of continued anti-hormonal therapy on adjuvant basis.   # CKD- Stage IV- stable [Dr.Singh]  # Mild Anemia- Hb 11 sec to CKD- Stable  # Mild chronic thrombocytopenia- ~110-120 ? Etiology- Stable/asymtomatic.   #Patient's case was also discussed/reviewed the tumor conference.  Patient also met with breast note navigator.  Thank you Dr.Gutierrez for allowing me to participate in the care of your pleasant patient. Please do not hesitate to contact me with  questions or concerns in the interim.  Follow-up will be based upon surgical evaluation.   # 60 minutes face-to-face with the patient discussing the above plan of care; more than 50% of time spent on prognosis/ natural history; counseling and coordination.     All questions were answered. The patient knows to call the clinic with any problems, questions or concerns.    Cammie Sickle, MD 12/04/2017 12:25 AM

## 2017-12-03 NOTE — Assessment & Plan Note (Addendum)
#  Left breast ER PR positive HER-2/neu-pending; 2 o'clock position-up to 4 clusters [spanning approximately 6 cm as per the radiology review]-but seems to be a new diagnosis; rather than recurrent of her prior breast cancer given the position.  Discussed the role of PET scan for staging; however would hold this of until after definitive treatment/surgery.  #Reviewed the treatment options for her breast cancer in detail including-surgery; with lymph node evaluation.  Discussed options of lumpectomy with radiation versus mastectomy.  I would defer to discussion with Dr. Bary Castilla.  With mastectomy patient could potentially avoid radiation [especially given prior MammoSite]  #Reviewed the role of adjuvant chemotherapy; however given patient's borderline/stage IV kidney disease/borderline functional status-I would recommend holding off chemotherapy-unless aggressive features [like HER-2/neu positivity] shows up on final pathology.   #Given the ER/PR positive disease-I reviewed the importance of continued anti-hormonal therapy on adjuvant basis.   # CKD- Stage IV- stable [Dr.Singh]  # Mild Anemia- Hb 11 sec to CKD- Stable  # Mild chronic thrombocytopenia- ~110-120 ? Etiology- Stable/asymtomatic.   #Patient's case was also discussed/reviewed the tumor conference.  Patient also met with breast note navigator.  Thank you Dr.Gutierrez for allowing me to participate in the care of your pleasant patient. Please do not hesitate to contact me with questions or concerns in the interim.  Follow-up will be based upon surgical evaluation.   # 60 minutes face-to-face with the patient discussing the above plan of care; more than 50% of time spent on prognosis/ natural history; counseling and coordination.

## 2017-12-03 NOTE — Telephone Encounter (Deleted)
Spoke to Dr.Oaks  pt re: referral; agrees to have pt evaluated after in the week of June 24th. Dx; lung carcinoid

## 2017-12-07 LAB — SURGICAL PATHOLOGY

## 2017-12-10 ENCOUNTER — Encounter: Payer: Self-pay | Admitting: Body Imaging

## 2017-12-11 ENCOUNTER — Telehealth: Payer: Self-pay

## 2017-12-11 DIAGNOSIS — E113212 Type 2 diabetes mellitus with mild nonproliferative diabetic retinopathy with macular edema, left eye: Secondary | ICD-10-CM | POA: Diagnosis not present

## 2017-12-11 DIAGNOSIS — E113211 Type 2 diabetes mellitus with mild nonproliferative diabetic retinopathy with macular edema, right eye: Secondary | ICD-10-CM | POA: Diagnosis not present

## 2017-12-11 NOTE — Telephone Encounter (Signed)
Message left for patient to call back to scheduled surgery.

## 2017-12-12 NOTE — Progress Notes (Signed)
  Oncology Nurse Navigator Documentation  Navigator Location: [facility] (12/12/17 1300)   )Navigator Encounter Type: Telephone (12/12/17 1300) Telephone: Incoming Call;Outgoing Call (12/12/17 1300)                   Patient Visit Type: Follow-up (12/12/17 1300)   Barriers/Navigation Needs: Coordination of Care (12/12/17 1300)   Interventions: Psycho-social support (12/12/17 1300)                      Time Spent with Patient: 15 (12/12/17 1300)   Patient phoned stating she has decided to have surgery, and is waiting to schedule.  She will call back with surgery info, so follow-up with Dr. Rogue Bussing can be scheduled.

## 2017-12-17 ENCOUNTER — Other Ambulatory Visit: Payer: Self-pay

## 2017-12-17 DIAGNOSIS — C50412 Malignant neoplasm of upper-outer quadrant of left female breast: Secondary | ICD-10-CM

## 2017-12-17 DIAGNOSIS — Z170 Estrogen receptor positive status [ER+]: Principal | ICD-10-CM

## 2017-12-17 NOTE — Telephone Encounter (Signed)
Have left another message for the patient to call us to see about scheduling her surgery.

## 2017-12-17 NOTE — Telephone Encounter (Signed)
Patient called back to schedule surgery. The patient is scheduled for surgery at [facility] on 01/07/18. She will pre admit at the hospital on 12/28/17. She will be seen here by Dr Bary Castilla for a pre op appointment on 12/27/17 at 2:45 pm. The patient is aware of dates, time, and instructions.

## 2017-12-27 ENCOUNTER — Encounter: Payer: Self-pay | Admitting: General Surgery

## 2017-12-27 ENCOUNTER — Ambulatory Visit: Payer: Self-pay

## 2017-12-27 ENCOUNTER — Ambulatory Visit (INDEPENDENT_AMBULATORY_CARE_PROVIDER_SITE_OTHER): Payer: Medicare Other | Admitting: General Surgery

## 2017-12-27 VITALS — BP 140/80 | HR 72 | Resp 16 | Ht 63.0 in | Wt 214.0 lb

## 2017-12-27 DIAGNOSIS — I6523 Occlusion and stenosis of bilateral carotid arteries: Secondary | ICD-10-CM | POA: Diagnosis not present

## 2017-12-27 DIAGNOSIS — C50412 Malignant neoplasm of upper-outer quadrant of left female breast: Secondary | ICD-10-CM

## 2017-12-27 DIAGNOSIS — Z170 Estrogen receptor positive status [ER+]: Secondary | ICD-10-CM | POA: Diagnosis not present

## 2017-12-27 MED ORDER — LIDOCAINE-PRILOCAINE 2.5-2.5 % EX CREA: TOPICAL_CREAM | CUTANEOUS | 0 refills | Status: DC

## 2017-12-27 NOTE — Progress Notes (Signed)
Patient ID: Alexis Tucker, female   DOB: 03-09-1941, 76 y.o.   MRN: [id number]  Chief Complaint  Patient presents with  . Pre-op Exam    HPI Alexis Tucker is a 77 y.o. female here today for her pre op left breast wide excision scheduled for 01/07/2018.  The patient is once again accompanied by her daughter, [name].  HPI  Past Medical History:  Diagnosis Date  . Anemia    per prior pcp  . Breast cancer ([place]) 2008   F/U radiation   . Breast cancer ([place]) 11/27/2017   left breast INVASIVE MAMMARY CARCINOMA  . Carotid stenosis, asymptomatic 01/2014   mild ICA, severe in ECA, rec rpt [place] 2 years  . Cataracts, bilateral   . CKD (chronic kidney disease) stage 4, GFR 15-29 ml/min (HCC) 01/2012   Cr 3.85, in 3s since 2010, discussing HD and L forearm graft (Singh/Schnier)  . Diabetes type 2, controlled ([place]) 2000s  . Diabetic retinopathy with macular edema ([place]) 03/2014   [place] at [facility] eye on avastin  . DJD (degenerative joint disease)    per prior pcp  . GERD (gastroesophageal reflux disease)   . Glaucoma 03/2014  . History of breast cancer 2008   infiltrating ductal carcinoma s/p mammosite  . History of colon cancer 2006   s/p colectomy  . History of hepatitis A 1990s   from food, resolved  . HTN (hypertension)   . Macular degeneration    wet R with vision loss, dry L; to see retina specialist  . Neuropathy    per prior pcp  . OA (osteoarthritis)    knees and hips  . Osteonecrosis (HCC)    hips?  . Personal history of radiation therapy 2008   F/U Left Breast Cancer  . PONV (postoperative nausea and vomiting)   . Post-surgical hypothyroidism   . Wears dentures    partial upper    Past Surgical History:  Procedure Laterality Date  . ABDOMINAL HYSTERECTOMY  1976   partial, after tubal pregnancy, h/o fibroids with heavy bleeding  . BREAST BIOPSY Left 2008   positive  . BREAST BIOPSY Left 11/22/2017   2oclock 2cmfn wing shaped clip, INVASIVE MAMMARY CARCINOMA  .  BREAST BIOPSY Left 11/22/2017   2oclock 4cmfn coil shaped clip, INVASIVE MAMMARY CARCINOMA  . BREAST BIOPSY Left 11/22/2017   lymph node - butterfly hydroMARK  . BREAST LUMPECTOMY Left 2008   F/U radiation  . BREAST MAMMOSITE  2008  . carotid ultrasound  12/2012   1-39% bilateral stenosis, severe in ECA  . CATARACT EXTRACTION W/PHACO Left 08/14/2016   Procedure: CATARACT EXTRACTION PHACO AND INTRAOCULAR LENS PLACEMENT (IOC)  Left Diabetic;  Surgeon: Ronnell Freshwater, MD;  Location: [facility];  Service: Ophthalmology;  Laterality: Left;  Diabetic - oral meds  . CHOLECYSTECTOMY  2005  . COLECTOMY  2005   colon cancer  . COLON SURGERY  2006   bowel obstruction  . COLONOSCOPY  2008   ext hem, ileo-colonic anastomosis in cecum, tubular adenoma x1, rec rpt 1 yr for multiple polyps (in [place])  . ESOPHAGOGASTRODUODENOSCOPY N/A 05/06/2016   Procedure: ESOPHAGOGASTRODUODENOSCOPY (EGD);  Surgeon: Manya Silvas, MD;  Location: [facility] ENDOSCOPY;  Service: Endoscopy;  Laterality: N/A;  . THYROIDECTOMY, PARTIAL     unclear why  . TOTAL KNEE ARTHROPLASTY Right 1999  . US ECHOCARDIOGRAPHY  11/2007   nl LV fxn EF 24%, diastolic dysfunction, LVH, tr TR and MR, slcerotic aortic valve    Family  History  Problem Relation Age of Onset  . Diabetes Mother   . Hypertension Mother   . Arthritis Mother   . Alcohol abuse Father   . Arthritis Father   . Stroke Daughter   . CAD Neg Hx   . Cancer Neg Hx   . Breast cancer Neg Hx     Social History Social History   Tobacco Use  . Smoking status: Former Research scientist (life sciences)  . Smokeless tobacco: Never Used  . Tobacco comment: quit over 40 yrs ago  Substance Use Topics  . Alcohol use: No    Comment: rare  . Drug use: No    Allergies  Allergen Reactions  . Tramadol Other (See Comments)    nightmares  . Codeine Anxiety  . Sulfa Antibiotics Itching and Rash    Current Outpatient Medications  Medication Sig Dispense Refill  . acetaminophen  (TYLENOL) 325 MG tablet Take 1 tablet (325 mg total) by mouth every 6 (six) hours as needed for mild pain (or Fever >/= 101).    . Acetylcysteine 600 MG CAPS Take by mouth daily.    Marland Kitchen allopurinol (ZYLOPRIM) 100 MG tablet TAKE 1 TABLET BY MOUTH DAILY 90 tablet 0  . anastrozole (ARIMIDEX) 1 MG tablet Take 1 tablet (1 mg total) by mouth daily. 90 tablet 1  . aspirin EC 81 MG EC tablet Take 1 tablet (81 mg total) by mouth daily. 30 tablet 12  . Blood Glucose Monitoring Suppl (ACURA BLOOD GLUCOSE METER) W/DEVICE KIT Use as directed to check sugars daily 250.42 1 kit 0  . Calcium-Magnesium-Vitamin D (CALCIUM MAGNESIUM PO) Take 1 tablet by mouth daily.    . carvedilol (COREG) 25 MG tablet Take 1 tablet (25 mg total) by mouth daily. 90 tablet 1  . Cholecalciferol (VITAMIN D-3) 5000 UNITS TABS Take 1 tablet by mouth daily.    . diclofenac sodium (VOLTAREN) 1 % GEL APPLY 1 APPLICATION TOPICALLY 3 (THREE) TIMES DAILY. 100 g 0  . ferrous sulfate (SLOW IRON) 160 (50 Fe) MG TBCR SR tablet Take 1 tablet (160 mg total) by mouth daily. 90 each 3  . Folic Acid-Vit C5-ENI D78 (FOLBEE) 2.5-25-1 MG TABS tablet Take 1 tablet by mouth daily.    . isosorbide dinitrate (ISORDIL) 20 MG tablet Take 1 tablet (20 mg total) by mouth daily. 90 tablet 1  . latanoprost (XALATAN) 0.005 % ophthalmic solution Place 1 drop into both eyes at bedtime.    Marland Kitchen levothyroxine (SYNTHROID, LEVOTHROID) 88 MCG tablet Take 1 tablet (88 mcg total) by mouth daily. First week take 1/2 tablet daily then increase to 1 tablet daily 90 tablet 1  . pantoprazole (PROTONIX) 40 MG tablet Take 1 tablet (40 mg total) by mouth daily as needed. 90 tablet 1  . pentoxifylline (TRENTAL) 400 MG CR tablet Take 1 tablet (400 mg total) by mouth daily. 90 tablet 1  . promethazine (PHENERGAN) 25 MG tablet Take 1 tablet (25 mg total) by mouth every 6 (six) hours as needed for nausea or vomiting. 30 tablet 0  . saxagliptin HCl (ONGLYZA) 2.5 MG TABS tablet Take 1 tablet  (2.5 mg total) by mouth daily. 30 tablet 6   No current facility-administered medications for this visit.     Review of Systems Review of Systems  Constitutional: Negative.   Respiratory: Negative.   Cardiovascular: Negative.     Blood pressure 140/80, pulse 72, resp. rate 16, height 5' 3" (1.6 m), weight 214 lb (97.1 kg).  Physical Exam Physical Exam  Constitutional: She is oriented to person, place, and time. She appears well-developed and well-nourished.  Eyes: Conjunctivae are normal. No scleral icterus.  Cardiovascular: Normal rate, regular rhythm and normal heart sounds.  Pulmonary/Chest: Effort normal and breath sounds normal.    Lymphadenopathy:    She has no cervical adenopathy.  Neurological: She is alert and oriented to person, place, and time.  Skin: Skin is warm and dry.    Data Reviewed Ultrasound examination was completed to determine the potential for left breast conservation surgery, which the patient has expressed a very strong interest in pursuing.  At the 2 o'clock position 2 cm from the nipple the dominant mass measuring 0.7 x 1.25 x 1.5 cm is identified, just 1 cm below the surface.  At the 4 o'clock position a less dominant mass measuring 0.5 x 0.6 x 1.08 cm is noted.  In the axilla the dominant lymph node measures 1.24 cm in maximum diameter and the smaller node measures 0.74 cm.  BI-RADS-6. Ultrasound completed on Nov 02, 2017 showed described for nodules being in the 2 o'clock position beginning in the subareolar area and direct communication with the 2:00 lesion as well as the small lesion noted at 4:00 and a fourth irregular mass with a reported AP diameter of 6 cm from the most superficial to the deep lesion, all in the line in the 2 o'clock position.  Biopsy of the areas at the 2 cm and 4 cm locations had previously been completed as well as the biopsy of the single prominent axillary node.   Assessment    Recurrent breast cancer 13 years status post  treatment with wide excision, axillary dissection/extensive node sampling and accelerated partial breast radiation.  Strong desire for breast conservation.    Plan  We had a long and frank discussion that considering the volume of tissue previously removed and the multiple nodules within the upper outer quadrant of the right breast that achieving clear margins may result in a significantly Patient is scheduled for left breast wide excision on 01/07/2018.The patient is aware to call back for any questions or concerns.  Smaller breast, and may require reexcision to assure negative margins, and indeed mastectomy may still be required if clear margins cannot be achieved.  The patient again is very interested in breast conservation if possible, and is willing to accept additional surgery if needed..  Considering the marked distortion in the upper outer quadrant, sentinel node biopsy may be difficult if impossible.  As there are 2 nodes evident on ultrasound, efforts will be made to retrieve these in their entirety if these are not identified his sentinel nodes by technetium or methylene blue.  We spent about 40 minutes reviewing her options for management.  HPI, Physical Exam, Assessment and Plan have been scribed under the direction and in the presence of Hervey Ard, MD.  Gaspar Cola, CMA  I have completed the exam and reviewed the above documentation for accuracy and completeness.  I agree with the above.  Haematologist has been used and any errors in dictation or transcription are unintentional.  Hervey Ard, M.D., F.A.C.S.  Forest Gleason Kortez Murtagh 12/27/2017, 8:21 PM

## 2017-12-27 NOTE — Patient Instructions (Addendum)
Patient is scheduled for surgery on 01/07/2018.  Marland Kitchenasascq

## 2017-12-28 ENCOUNTER — Other Ambulatory Visit: Payer: Self-pay

## 2017-12-28 ENCOUNTER — Encounter
Admission: RE | Admit: 2017-12-28 | Discharge: 2017-12-28 | Disposition: A | Discharge status: Home / Self Care | Payer: Medicare Other | Source: Ambulatory Visit | Attending: General Surgery | Admitting: General Surgery

## 2017-12-28 DIAGNOSIS — Z01812 Encounter for preprocedural laboratory examination: Secondary | ICD-10-CM | POA: Insufficient documentation

## 2017-12-28 LAB — CBC
HEMATOCRIT: 33.3 % — AB (ref 35.0–47.0)
HEMOGLOBIN: 10.8 g/dL — AB (ref 12.0–16.0)
MCH: 30.3 pg (ref 26.0–34.0)
MCHC: 32.6 g/dL (ref 32.0–36.0)
MCV: 92.9 fL (ref 80.0–100.0)
Platelets: 119 10*3/uL — ABNORMAL LOW (ref 150–440)
RBC: 3.58 MIL/uL — AB (ref 3.80–5.20)
RDW: 15.5 % — ABNORMAL HIGH (ref 11.5–14.5)
WBC: 4.2 10*3/uL (ref 3.6–11.0)

## 2017-12-28 LAB — PROTIME-INR
INR: 1.03
PROTHROMBIN TIME: 13.4 s (ref 11.4–15.2)

## 2017-12-28 LAB — APTT: aPTT: <24 seconds — ABNORMAL LOW (ref 24–36)

## 2017-12-28 NOTE — Pre-Procedure Instructions (Signed)
Abnormal labs (APTT, CBC) faxed to Dr. Dwyane Luo office.  Per Rodman Key in Lab, insufficient sample for BMP. Ordered I-Stat on DOS.

## 2017-12-28 NOTE — Patient Instructions (Addendum)
  Your procedure is scheduled on: Monday January 07, 2018 Report to Nuclear Medicine 1st floor medical mall [facility] Entrance. Check in at Registration.) To find out your arrival time please call [phone] between 1PM - 3PM on Friday January 04, 2018  Remember: Instructions that are not followed completely may result in serious medical risk, up to and including death, or upon the discretion of your surgeon and anesthesiologist your surgery may need to be rescheduled.    _x___ 1. Do not eat food after midnight the night before your procedure. You may drink water up to 2 hours before you are scheduled to arrive at the hospital for your procedure.  Do not drink anything within 2 hours of your scheduled arrival to the hospital.   No gum chewing or hard candies.      __x__ 2. No Alcohol for 24 hours before or after surgery.   __x__3. No Smoking or e-cigarettes for 24 prior to surgery.  Do not use any chewable tobacco products for at least 6 hour prior to surgery   ____  4. Bring all medications with you on the day of surgery if instructed.    __x__ 5. Notify your doctor if there is any change in your medical condition     (cold, fever, infections).   __x__6. On the morning of surgery brush your teeth with toothpaste and water.  You may rinse your mouth with mouth wash if you wish.  Do not swallow any toothpaste or mouthwash.   Do not wear jewelry, make-up, hairpins, clips or nail polish.  Do not wear lotions, powders, deodorant, or perfumes.   Do not shave 48 hours prior to surgery.   Do not bring valuables to the hospital.    [facility] is not responsible for any belongings or valuables.               Contacts, dentures or bridgework may not be worn into surgery.  Leave your suitcase in the car. After surgery it may be brought to your room.  For patients admitted to the hospital, discharge time is determined by your treatment team.  Please read over the following fact sheets that you  were given:   [facility] Preparing for Surgery and or MRSA Information   _x___ Take anti-hypertensive listed below, cardiac, seizure, asthma, anti-reflux and psychiatric medicines. These include:  1. Pantoprazole/Protonix  2. Carvedilol/Coreg  3. Levothyroxine/Synthroid  4. Isosorbide/Isordil  _x___ Use CHG Soap or sage wipes as directed on instruction sheet   _x___ Follow recommendations from Cardiologist, Pulmonologist or PCP regarding stopping Aspirin, Coumadin, Plavix ,Eliquis, Effient, or Pradaxa, and Pletal.  _x___  NOW: stop Anti-inflammatories such as Advil, Aleve, Ibuprofen, Motrin, Naproxen, Naprosyn, Goodies powders or aspirin products. OK to take Tylenol and Celebrex.   _x___  NOW: Stop supplements (NAC N-Acetyl Cysteine), until after surgery. But may continue Vitamin D, Vitamin B, and multivitamin.

## 2018-01-06 MED ORDER — CEFAZOLIN SODIUM-DEXTROSE 2-4 GM/100ML-% IV SOLN
2.0000 g | INTRAVENOUS | Status: AC
  Administered 2018-01-07: 2 g via INTRAVENOUS

## 2018-01-07 ENCOUNTER — Encounter: Payer: Self-pay | Admitting: Anesthesiology

## 2018-01-07 ENCOUNTER — Ambulatory Visit: Payer: Medicare Other | Admitting: Anesthesiology

## 2018-01-07 ENCOUNTER — Ambulatory Visit
Admission: RE | Admit: 2018-01-07 | Discharge: 2018-01-07 | Disposition: A | Discharge status: Home / Self Care | Payer: Medicare Other | Source: Ambulatory Visit | Attending: General Surgery | Admitting: General Surgery

## 2018-01-07 ENCOUNTER — Other Ambulatory Visit: Payer: Self-pay

## 2018-01-07 ENCOUNTER — Encounter
Admission: RE | Disposition: A | Discharge status: Home / Self Care | Payer: Self-pay | Source: Ambulatory Visit | Attending: General Surgery

## 2018-01-07 DIAGNOSIS — D631 Anemia in chronic kidney disease: Secondary | ICD-10-CM | POA: Diagnosis not present

## 2018-01-07 DIAGNOSIS — I739 Peripheral vascular disease, unspecified: Secondary | ICD-10-CM | POA: Diagnosis not present

## 2018-01-07 DIAGNOSIS — Z170 Estrogen receptor positive status [ER+]: Secondary | ICD-10-CM

## 2018-01-07 DIAGNOSIS — C50912 Malignant neoplasm of unspecified site of left female breast: Secondary | ICD-10-CM

## 2018-01-07 DIAGNOSIS — I480 Paroxysmal atrial fibrillation: Secondary | ICD-10-CM | POA: Diagnosis not present

## 2018-01-07 DIAGNOSIS — N185 Chronic kidney disease, stage 5: Secondary | ICD-10-CM | POA: Diagnosis not present

## 2018-01-07 DIAGNOSIS — Z87891 Personal history of nicotine dependence: Secondary | ICD-10-CM | POA: Diagnosis not present

## 2018-01-07 DIAGNOSIS — C50919 Malignant neoplasm of unspecified site of unspecified female breast: Secondary | ICD-10-CM | POA: Diagnosis not present

## 2018-01-07 DIAGNOSIS — K219 Gastro-esophageal reflux disease without esophagitis: Secondary | ICD-10-CM | POA: Diagnosis not present

## 2018-01-07 DIAGNOSIS — E1151 Type 2 diabetes mellitus with diabetic peripheral angiopathy without gangrene: Secondary | ICD-10-CM | POA: Diagnosis not present

## 2018-01-07 DIAGNOSIS — Z79899 Other long term (current) drug therapy: Secondary | ICD-10-CM | POA: Diagnosis not present

## 2018-01-07 DIAGNOSIS — E1122 Type 2 diabetes mellitus with diabetic chronic kidney disease: Secondary | ICD-10-CM | POA: Diagnosis not present

## 2018-01-07 DIAGNOSIS — E039 Hypothyroidism, unspecified: Secondary | ICD-10-CM | POA: Diagnosis not present

## 2018-01-07 DIAGNOSIS — C50412 Malignant neoplasm of upper-outer quadrant of left female breast: Secondary | ICD-10-CM

## 2018-01-07 DIAGNOSIS — I10 Essential (primary) hypertension: Secondary | ICD-10-CM | POA: Diagnosis not present

## 2018-01-07 DIAGNOSIS — I4891 Unspecified atrial fibrillation: Secondary | ICD-10-CM | POA: Diagnosis not present

## 2018-01-07 DIAGNOSIS — Z8673 Personal history of transient ischemic attack (TIA), and cerebral infarction without residual deficits: Secondary | ICD-10-CM | POA: Insufficient documentation

## 2018-01-07 DIAGNOSIS — M109 Gout, unspecified: Secondary | ICD-10-CM | POA: Insufficient documentation

## 2018-01-07 DIAGNOSIS — E119 Type 2 diabetes mellitus without complications: Secondary | ICD-10-CM | POA: Diagnosis not present

## 2018-01-07 DIAGNOSIS — M1990 Unspecified osteoarthritis, unspecified site: Secondary | ICD-10-CM | POA: Diagnosis not present

## 2018-01-07 DIAGNOSIS — E890 Postprocedural hypothyroidism: Secondary | ICD-10-CM | POA: Diagnosis not present

## 2018-01-07 DIAGNOSIS — I120 Hypertensive chronic kidney disease with stage 5 chronic kidney disease or end stage renal disease: Secondary | ICD-10-CM | POA: Diagnosis not present

## 2018-01-07 HISTORY — PX: BREAST LUMPECTOMY WITH SENTINEL LYMPH NODE BIOPSY: SHX5597

## 2018-01-07 HISTORY — PX: BREAST EXCISIONAL BIOPSY: SUR124

## 2018-01-07 LAB — GLUCOSE, CAPILLARY
Glucose-Capillary: 109 mg/dL — ABNORMAL HIGH (ref 70–99)
Glucose-Capillary: 98 mg/dL (ref 70–99)
Glucose-Capillary: 118 mg/dL — ABNORMAL HIGH (ref 70–99)

## 2018-01-07 LAB — BASIC METABOLIC PANEL
ANION GAP: 6 (ref 5–15)
BUN: 42 mg/dL — ABNORMAL HIGH (ref 8–23)
CHLORIDE: 114 mmol/L — AB (ref 98–111)
CO2: 21 mmol/L — AB (ref 22–32)
CREATININE: 4.7 mg/dL — AB (ref 0.44–1.00)
Calcium: 9.3 mg/dL (ref 8.9–10.3)
GFR calc non Af Amer: 8 mL/min — ABNORMAL LOW (ref >60)
GFR, EST AFRICAN AMERICAN: 10 mL/min — AB (ref >60)
Glucose, Bld: 127 mg/dL — ABNORMAL HIGH (ref 70–99)
Potassium: 4.9 mmol/L (ref 3.5–5.1)
Sodium: 141 mmol/L (ref 135–145)

## 2018-01-07 SURGERY — BREAST LUMPECTOMY WITH SENTINEL LYMPH NODE BX
Anesthesia: General | Laterality: Left | Wound class: Clean

## 2018-01-07 MED ORDER — DEXAMETHASONE SODIUM PHOSPHATE 10 MG/ML IJ SOLN
INTRAMUSCULAR | Status: AC
Start: 2018-01-07 — End: ?
  Filled 2018-01-07: qty 1

## 2018-01-07 MED ORDER — HYDROCODONE-ACETAMINOPHEN 5-325 MG PO TABS: 1.0000 | ORAL_TABLET | ORAL | 0 refills | Status: DC | PRN

## 2018-01-07 MED ORDER — ONDANSETRON HCL 4 MG/2ML IJ SOLN
INTRAMUSCULAR | Status: AC
Start: 2018-01-07 — End: ?
  Filled 2018-01-07: qty 2

## 2018-01-07 MED ORDER — BUPIVACAINE-EPINEPHRINE (PF) 0.5% -1:200000 IJ SOLN
INTRAMUSCULAR | Status: DC | PRN
  Administered 2018-01-07: 10 mL via PERINEURAL
  Administered 2018-01-07: 20 mL via PERINEURAL

## 2018-01-07 MED ORDER — ONDANSETRON HCL 4 MG/2ML IJ SOLN
INTRAMUSCULAR | Status: DC | PRN
  Administered 2018-01-07: 4 mg via INTRAVENOUS

## 2018-01-07 MED ORDER — DEXAMETHASONE SODIUM PHOSPHATE 10 MG/ML IJ SOLN
INTRAMUSCULAR | Status: DC | PRN
  Administered 2018-01-07: 10 mg via INTRAVENOUS

## 2018-01-07 MED ORDER — METOPROLOL TARTRATE 5 MG/5ML IV SOLN
2.0000 mg | Freq: Once | INTRAVENOUS | Status: AC
  Administered 2018-01-07: 2 mg via INTRAVENOUS

## 2018-01-07 MED ORDER — LIDOCAINE HCL (CARDIAC) PF 100 MG/5ML IV SOSY
PREFILLED_SYRINGE | INTRAVENOUS | Status: DC | PRN
  Administered 2018-01-07: 80 mg via INTRAVENOUS

## 2018-01-07 MED ORDER — SODIUM CHLORIDE 0.9 % IV SOLN
INTRAVENOUS | Status: DC
  Administered 2018-01-07: 10:00:00 via INTRAVENOUS

## 2018-01-07 MED ORDER — FENTANYL CITRATE (PF) 100 MCG/2ML IJ SOLN
INTRAMUSCULAR | Status: DC | PRN
  Administered 2018-01-07: 50 ug via INTRAVENOUS
  Administered 2018-01-07 (×3): 25 ug via INTRAVENOUS
  Administered 2018-01-07: 50 ug via INTRAVENOUS
  Administered 2018-01-07: 25 ug via INTRAVENOUS

## 2018-01-07 MED ORDER — TECHNETIUM TC 99M SULFUR COLLOID FILTERED
1.0000 | Freq: Once | INTRAVENOUS | Status: AC | PRN
  Administered 2018-01-07: 0.675 via INTRADERMAL

## 2018-01-07 MED ORDER — CEFAZOLIN SODIUM-DEXTROSE 2-4 GM/100ML-% IV SOLN
INTRAVENOUS | Status: AC
  Filled 2018-01-07: qty 100

## 2018-01-07 MED ORDER — FENTANYL CITRATE (PF) 100 MCG/2ML IJ SOLN
INTRAMUSCULAR | Status: AC
  Filled 2018-01-07: qty 2

## 2018-01-07 MED ORDER — PHENYLEPHRINE HCL 10 MG/ML IJ SOLN
INTRAMUSCULAR | Status: DC | PRN
  Administered 2018-01-07 (×3): 100 ug via INTRAVENOUS

## 2018-01-07 MED ORDER — SODIUM CHLORIDE FLUSH 0.9 % IV SOLN
INTRAVENOUS | Status: AC
  Filled 2018-01-07: qty 10

## 2018-01-07 MED ORDER — SUGAMMADEX SODIUM 500 MG/5ML IV SOLN
INTRAVENOUS | Status: AC
  Filled 2018-01-07: qty 5

## 2018-01-07 MED ORDER — METHYLENE BLUE 0.5 % INJ SOLN
INTRAVENOUS | Status: AC
  Filled 2018-01-07: qty 10

## 2018-01-07 MED ORDER — LIDOCAINE HCL (PF) 2 % IJ SOLN
INTRAMUSCULAR | Status: AC
  Filled 2018-01-07: qty 10

## 2018-01-07 MED ORDER — LABETALOL HCL 5 MG/ML IV SOLN
5.0000 mg | INTRAVENOUS | Status: AC | PRN
  Administered 2018-01-07 (×4): 5 mg via INTRAVENOUS

## 2018-01-07 MED ORDER — PROMETHAZINE HCL 25 MG/ML IJ SOLN
INTRAMUSCULAR | Status: AC
  Administered 2018-01-07: 6.25 mg via INTRAVENOUS
  Filled 2018-01-07: qty 1

## 2018-01-07 MED ORDER — PROMETHAZINE HCL 25 MG/ML IJ SOLN
6.2500 mg | INTRAMUSCULAR | Status: AC | PRN
  Administered 2018-01-07 (×2): 6.25 mg via INTRAVENOUS

## 2018-01-07 MED ORDER — METOPROLOL TARTRATE 5 MG/5ML IV SOLN
3.0000 mg | Freq: Once | INTRAVENOUS | Status: AC
  Administered 2018-01-07: 3 mg via INTRAVENOUS

## 2018-01-07 MED ORDER — ACETAMINOPHEN 10 MG/ML IV SOLN
INTRAVENOUS | Status: DC | PRN
  Administered 2018-01-07: 1000 mg via INTRAVENOUS

## 2018-01-07 MED ORDER — LABETALOL HCL 5 MG/ML IV SOLN
INTRAVENOUS | Status: AC
  Administered 2018-01-07: 5 mg via INTRAVENOUS
  Filled 2018-01-07: qty 4

## 2018-01-07 MED ORDER — GLYCOPYRROLATE 0.2 MG/ML IJ SOLN
INTRAMUSCULAR | Status: AC
  Filled 2018-01-07: qty 1

## 2018-01-07 MED ORDER — PROPOFOL 10 MG/ML IV BOLUS
INTRAVENOUS | Status: AC
  Filled 2018-01-07: qty 20

## 2018-01-07 MED ORDER — ONDANSETRON HCL 4 MG/2ML IJ SOLN
4.0000 mg | Freq: Once | INTRAMUSCULAR | Status: AC | PRN
  Administered 2018-01-07: 4 mg via INTRAVENOUS

## 2018-01-07 MED ORDER — ACETAMINOPHEN 10 MG/ML IV SOLN
INTRAVENOUS | Status: AC
  Filled 2018-01-07: qty 100

## 2018-01-07 MED ORDER — FENTANYL CITRATE (PF) 100 MCG/2ML IJ SOLN
25.0000 ug | INTRAMUSCULAR | Status: DC | PRN
Start: 2018-01-07 — End: 2018-01-07

## 2018-01-07 MED ORDER — METOPROLOL TARTRATE 5 MG/5ML IV SOLN
INTRAVENOUS | Status: AC
  Administered 2018-01-07: 3 mg via INTRAVENOUS
  Filled 2018-01-07: qty 5

## 2018-01-07 MED ORDER — MIDAZOLAM HCL 2 MG/2ML IJ SOLN
INTRAMUSCULAR | Status: AC
  Filled 2018-01-07: qty 2

## 2018-01-07 MED ORDER — GLYCOPYRROLATE 0.2 MG/ML IJ SOLN
INTRAMUSCULAR | Status: DC | PRN
  Administered 2018-01-07: 0.2 mg via INTRAVENOUS

## 2018-01-07 MED ORDER — BUPIVACAINE-EPINEPHRINE (PF) 0.5% -1:200000 IJ SOLN
INTRAMUSCULAR | Status: AC
  Filled 2018-01-07: qty 30

## 2018-01-07 MED ORDER — ONDANSETRON HCL 4 MG/2ML IJ SOLN
INTRAMUSCULAR | Status: AC
  Filled 2018-01-07: qty 2

## 2018-01-07 MED ORDER — PROPOFOL 10 MG/ML IV BOLUS
INTRAVENOUS | Status: DC | PRN
  Administered 2018-01-07: 100 mg via INTRAVENOUS

## 2018-01-07 SURGICAL SUPPLY — 54 items
BINDER BREAST LRG (GAUZE/BANDAGES/DRESSINGS) IMPLANT
BINDER BREAST MEDIUM (GAUZE/BANDAGES/DRESSINGS) IMPLANT
BINDER BREAST XLRG (GAUZE/BANDAGES/DRESSINGS) IMPLANT
BINDER BREAST XXLRG (GAUZE/BANDAGES/DRESSINGS) IMPLANT
BLADE SURG 15 STRL SS SAFETY (BLADE) ×6 IMPLANT
BULB RESERV EVAC DRAIN JP 100C (MISCELLANEOUS) IMPLANT
CANISTER SUCT 1200ML W/VALVE (MISCELLANEOUS) ×3 IMPLANT
CHLORAPREP W/TINT 26ML (MISCELLANEOUS) ×3 IMPLANT
CLOSURE WOUND 1/2 X4 (GAUZE/BANDAGES/DRESSINGS) ×1
CNTNR SPEC 2.5X3XGRAD LEK (MISCELLANEOUS)
CONT SPEC 4OZ STER OR WHT (MISCELLANEOUS)
CONTAINER SPEC 2.5X3XGRAD LEK (MISCELLANEOUS) IMPLANT
COVER PROBE FLX POLY STRL (MISCELLANEOUS) ×3 IMPLANT
DEVICE DUBIN SPECIMEN MAMMOGRA (MISCELLANEOUS) ×3 IMPLANT
DRAIN CHANNEL JP 15F RND 16 (MISCELLANEOUS) IMPLANT
DRAPE LAPAROTOMY TRNSV 106X77 (MISCELLANEOUS) ×3 IMPLANT
DRSG GAUZE FLUFF 36X18 (GAUZE/BANDAGES/DRESSINGS) ×6 IMPLANT
DRSG TELFA 3X8 NADH (GAUZE/BANDAGES/DRESSINGS) ×3 IMPLANT
ELECT CAUTERY BLADE TIP 2.5 (TIP) ×3
ELECT REM PT RETURN 9FT ADLT (ELECTROSURGICAL) ×3
ELECTRODE CAUTERY BLDE TIP 2.5 (TIP) ×1 IMPLANT
ELECTRODE REM PT RTRN 9FT ADLT (ELECTROSURGICAL) ×1 IMPLANT
GAUZE SPONGE 4X4 12PLY STRL (GAUZE/BANDAGES/DRESSINGS) ×3 IMPLANT
GLOVE BIO SURGEON STRL SZ7.5 (GLOVE) ×3 IMPLANT
GLOVE INDICATOR 8.0 STRL GRN (GLOVE) ×3 IMPLANT
GOWN STRL REUS W/ TWL LRG LVL3 (GOWN DISPOSABLE) ×2 IMPLANT
GOWN STRL REUS W/TWL LRG LVL3 (GOWN DISPOSABLE) ×4
KIT TURNOVER KIT A (KITS) ×3 IMPLANT
LABEL OR SOLS (LABEL) ×3 IMPLANT
MARGIN MAP 10MM (MISCELLANEOUS) ×3 IMPLANT
NEEDLE HYPO 22GX1.5 SAFETY (NEEDLE) ×3 IMPLANT
NEEDLE HYPO 25X1 1.5 SAFETY (NEEDLE) ×6 IMPLANT
PACK BASIN MINOR ARMC (MISCELLANEOUS) ×3 IMPLANT
RETRACTOR RING XSMALL (MISCELLANEOUS) ×1 IMPLANT
RTRCTR WOUND ALEXIS 13CM XS SH (MISCELLANEOUS) ×3
SHEARS FOC LG CVD HARMONIC 17C (MISCELLANEOUS) IMPLANT
SHEARS HARMONIC 9CM CVD (BLADE) ×3 IMPLANT
SLEVE PROBE SENORX GAMMA FIND (MISCELLANEOUS) ×3 IMPLANT
STRIP CLOSURE SKIN 1/2X4 (GAUZE/BANDAGES/DRESSINGS) ×2 IMPLANT
SUT ETHILON 3-0 FS-10 30 BLK (SUTURE) ×3
SUT SILK 2 0 (SUTURE) ×2
SUT SILK 2-0 18XBRD TIE 12 (SUTURE) ×1 IMPLANT
SUT VIC AB 2-0 CT1 27 (SUTURE) ×6
SUT VIC AB 2-0 CT1 TAPERPNT 27 (SUTURE) ×3 IMPLANT
SUT VIC AB 3-0 SH 27 (SUTURE) ×4
SUT VIC AB 3-0 SH 27X BRD (SUTURE) ×2 IMPLANT
SUT VIC AB 4-0 FS2 27 (SUTURE) ×6 IMPLANT
SUT VICRYL+ 3-0 144IN (SUTURE) ×3 IMPLANT
SUTURE EHLN 3-0 FS-10 30 BLK (SUTURE) ×1 IMPLANT
SWABSTK COMLB BENZOIN TINCTURE (MISCELLANEOUS) ×3 IMPLANT
SYR 10ML LL (SYRINGE) ×3 IMPLANT
SYR BULB IRRIG 60ML STRL (SYRINGE) ×3 IMPLANT
TAPE TRANSPORE STRL 2 31045 (GAUZE/BANDAGES/DRESSINGS) ×3 IMPLANT
WATER STERILE IRR 1000ML POUR (IV SOLUTION) ×3 IMPLANT

## 2018-01-07 NOTE — Anesthesia Post-op Follow-up Note (Signed)
Anesthesia QCDR form completed.        

## 2018-01-07 NOTE — OR Nursing (Signed)
Per Dr. Bary Castilla via tele, ok to discharge pt to home without his visit.

## 2018-01-07 NOTE — OR Nursing (Signed)
Anesthesia and Dr Bary Castilla reviewd labs.

## 2018-01-07 NOTE — Op Note (Signed)
Preoperative diagnosis: Mammary carcinoma of the left breast, desire for breast conservation.  Postoperative diagnosis: Same.  Operative procedure: Wide excision of multiple left breast cancers with mastoplasty, sentinel node biopsy x3.  Operating Surgeon: Hervey Ard, MD.  Anesthesia: General by LMA, Marcaine 0.5% with 1 to 200,000 units of epinephrine, 30 cc.  Estimated blood loss: 10 cc.  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  She recently had a mammogram showing multiple findings in the 2:00 location with biopsy of the 2 dominant lesion showing invasive mammary carcinoma.  2 additional lesions were noted on ultrasound one underneath the areola just inferior medial to the dominant palpable mass and a second smaller deep lesion just cephalad and medial to the second biopsied lesion.  In spite of her recurrent nature of her disease she did not want to have a mastectomy.  We had a long discussion about the pros and cons of attempted breast conservation after partial resection, but as she had had MammoSite radiation in the past she might indeed be a candidate for second MammoSite balloon placement.  (Original tumor was near the axillary tail).  Given her options for management the patient is desired to proceed with partial mastectomy.  She underwent sentinel node injection morning of procedure with significant discomfort in spite of the use of EMLA cream.  Operative note: The patient underwent general anesthesia and she tolerated this well.  It was elected not to make use of methylene blue as it would distort the retroareolar tissues where 1 of the ultrasound identified lesions was located.  The breast chest and axilla was cleansed with ChloraPrep and draped.  Ultrasound was used to identify the dominant palpable mass 2 cm from the nipple at  the 2 o'clock position and the second biopsied mass 4 cm from the nipple in the 2:00 location.  The secondary lesions noted above were also identified and it was like to take an ellipse of skin as the lesions were very close to the surface and partial breast radiation would require an adequate buffer.  Ellipse of skin extending to the base of the nipple and towards the axillary tail was outlined in the skin was incised sharply.  The remaining dissection was completed with electrocautery.  The specimen was orientated and specimen radiograph confirmed both previously biopsied areas were included in the specimen.  While the breast specimen was being processed attention was turned to the axilla.  The node seeker device showed increased counts near the base of the axilla, below the site of her previous axillary incision.  This area was infiltrated with local anesthesia (as had the wide excision site at the beginning of the case) for postoperative analgesia.  A transverse incision was made carried down through skin subtendinous tissue until the axillary envelope was opened.  Dense scarring consistent with her previous surgery and radiation was identified.  The first node area had counts of 8000, the second had counts of 700 and the last had counts of about 200.  These were sent in formalin for routine histology.  A small 2 x 3 cm pad of fat was also sent although there was no evidence of nodal tissue on scanning.  The deep tissue was approximated with interrupted 2-0 Vicryl figure-of-eight sutures after assuring good hemostasis.  Multiple layers were closed and the skin was approximated with a running 4-0 Vicryl subcuticular suture.  Attention was turned to closing the wide excision site.  The breast tissue in the retroareolar area adjacent to the largest tumor site was closed over a medicine cup to approximate the volume of a MammoSite balloon.  This was done with layers of 2-0 Vicryl figure-of-eight sutures.  The  lateral breast tissue was brought forward to provide a good 1.5 cm buffer of healthy well vascularized fat.  At the superior aspect, towards the site of the previous surgery multiple layers of closure were used to obliterate dead space and to prevent balloon migration.  There is about 6 cm between the dense scarring from her 2008 surgery and radiation in the central point of her dominant tumor mass just outside the edge of the areola at the 2 o'clock position.  The adipose layer was approximated with an additional layer of 2-0 Vicryl running suture.  The skin was then closed with a running 4-0 Vicryl subcuticular suture.  Benzoin, Steri-Strips followed by Telfa pad, fluff gauze and a compressive wrap were applied.  The patient tolerated the procedure well was taken to recovery room in stable condition.

## 2018-01-07 NOTE — Transfer of Care (Signed)
Immediate Anesthesia Transfer of Care Note  Patient: Alexis Tucker  Procedure(s) Performed: BREAST LUMPECTOMY WITH SENTINEL LYMPH NODE BX (Left )  Patient Location: PACU  Anesthesia Type:General  Level of Consciousness: sedated  Airway & Oxygen Therapy: Patient Spontanous Breathing and Patient connected to face mask oxygen  Post-op Assessment: Report given to RN and Post -op Vital signs reviewed and stable  Post vital signs: Reviewed and stable  Last Vitals:  Vitals Value Taken Time  BP 174/69 01/07/2018 12:25 PM  Temp 36.1 C 01/07/2018 12:25 PM  Pulse 81 01/07/2018 12:26 PM  Resp 15 01/07/2018 12:26 PM  SpO2 100 % 01/07/2018 12:26 PM  Vitals shown include unvalidated device data.  Last Pain:  Vitals:   01/07/18 0849  TempSrc: Temporal  PainSc: 0-No pain         Complications: No apparent anesthesia complications

## 2018-01-07 NOTE — Anesthesia Preprocedure Evaluation (Addendum)
Anesthesia Evaluation  Patient identified by MRN, date of birth, ID band Patient awake    Reviewed: Allergy & Precautions, NPO status , Patient's Chart, lab work & pertinent test results, reviewed documented beta blocker date and time   History of Anesthesia Complications (+) PONV and history of anesthetic complications  Airway Mallampati: III  TM Distance: >3 FB     Dental  (+) Chipped, Partial Upper   Pulmonary former smoker,           Cardiovascular hypertension, Pt. on home beta blockers and Pt. on medications + Peripheral Vascular Disease  + dysrhythmias Atrial Fibrillation      Neuro/Psych CVA    GI/Hepatic GERD  Controlled,  Endo/Other  diabetes, Type 2Hypothyroidism   Renal/GU Renal disease     Musculoskeletal  (+) Arthritis ,   Abdominal   Peds  Hematology  (+) anemia ,   Anesthesia Other Findings Echo and ekg ok. Gout.  Reproductive/Obstetrics                            Anesthesia Physical Anesthesia Plan  ASA: III  Anesthesia Plan: General   Post-op Pain Management:    Induction: Intravenous  PONV Risk Score and Plan:   Airway Management Planned: LMA  Additional Equipment:   Intra-op Plan:   Post-operative Plan:   Informed Consent: I have reviewed the patients History and Physical, chart, labs and discussed the procedure including the risks, benefits and alternatives for the proposed anesthesia with the patient or authorized representative who has indicated his/her understanding and acceptance.     Plan Discussed with: CRNA  Anesthesia Plan Comments:         Anesthesia Quick Evaluation

## 2018-01-07 NOTE — Anesthesia Postprocedure Evaluation (Signed)
Anesthesia Post Note  Patient: Alexis Tucker  Procedure(s) Performed: BREAST LUMPECTOMY WITH SENTINEL LYMPH NODE BX (Left )  Patient location during evaluation: PACU Anesthesia Type: General Level of consciousness: awake and alert Pain management: pain level controlled Vital Signs Assessment: post-procedure vital signs reviewed and stable Respiratory status: spontaneous breathing, nonlabored ventilation, respiratory function stable and patient connected to nasal cannula oxygen Cardiovascular status: blood pressure returned to baseline and stable Postop Assessment: no apparent nausea or vomiting Anesthetic complications: no     Last Vitals:  Vitals:   01/07/18 1240 01/07/18 1255  BP: (!) 179/76 (!) 196/74  Pulse: 85   Resp: 16   Temp:    SpO2: 100%     Last Pain:  Vitals:   01/07/18 1255  TempSrc:   PainSc: 0-No pain                 Shivonne Schwartzman S

## 2018-01-07 NOTE — H&P (Signed)
No change in clinical history or exam.  Significant discomfort with technetium injection.  For left breast wide excision and SLN/ axillary dissection.

## 2018-01-07 NOTE — Discharge Instructions (Signed)

## 2018-01-07 NOTE — Anesthesia Procedure Notes (Signed)
Procedure Name: LMA Insertion Date/Time: 01/07/2018 10:32 AM Performed by: Doreen Salvage, CRNA Pre-anesthesia Checklist: Patient identified, Patient being monitored, Timeout performed, Emergency Drugs available and Suction available Patient Re-evaluated:Patient Re-evaluated prior to induction Oxygen Delivery Method: Circle system utilized Preoxygenation: Pre-oxygenation with 100% oxygen Induction Type: IV induction Ventilation: Mask ventilation without difficulty LMA: LMA inserted LMA Size: 4.0 Tube type: Oral Number of attempts: 1 Placement Confirmation: positive ETCO2 and breath sounds checked- equal and bilateral Tube secured with: Tape Dental Injury: Teeth and Oropharynx as per pre-operative assessment

## 2018-01-08 ENCOUNTER — Encounter: Payer: Self-pay | Admitting: General Surgery

## 2018-01-08 ENCOUNTER — Ambulatory Visit: Payer: Medicare Other

## 2018-01-10 ENCOUNTER — Telehealth: Payer: Self-pay | Admitting: General Surgery

## 2018-01-10 LAB — SURGICAL PATHOLOGY

## 2018-01-10 NOTE — Telephone Encounter (Signed)
Message left that results were good. Margins clear, lymph nodes negative.

## 2018-01-11 ENCOUNTER — Ambulatory Visit: Payer: Medicare Other

## 2018-01-11 DIAGNOSIS — C50412 Malignant neoplasm of upper-outer quadrant of left female breast: Secondary | ICD-10-CM

## 2018-01-11 DIAGNOSIS — Z170 Estrogen receptor positive status [ER+]: Principal | ICD-10-CM

## 2018-01-11 NOTE — Progress Notes (Signed)
Patient ID: Alexis Tucker, female   DOB: 1941-05-19, 77 y.o.   MRN: [id number]  Patient came in today for a wound check post left breast lumpectomy and sentinel node biopsy. The wound is clean, with no signs of infection noted. Area rewrapped. Follow up as scheduled.

## 2018-01-15 ENCOUNTER — Ambulatory Visit (INDEPENDENT_AMBULATORY_CARE_PROVIDER_SITE_OTHER): Payer: Medicare Other

## 2018-01-15 ENCOUNTER — Encounter: Payer: Self-pay | Admitting: General Surgery

## 2018-01-15 ENCOUNTER — Ambulatory Visit: Payer: Medicare Other | Admitting: General Surgery

## 2018-01-15 VITALS — BP 146/80 | HR 70 | Resp 16 | Ht 63.0 in | Wt 213.0 lb

## 2018-01-15 DIAGNOSIS — C50412 Malignant neoplasm of upper-outer quadrant of left female breast: Secondary | ICD-10-CM

## 2018-01-15 DIAGNOSIS — Z170 Estrogen receptor positive status [ER+]: Secondary | ICD-10-CM

## 2018-01-15 MED ORDER — PROMETHAZINE HCL 12.5 MG PO TABS: 12.5000 mg | ORAL_TABLET | Freq: Four times a day (QID) | ORAL | 0 refills | Status: DC | PRN

## 2018-01-15 NOTE — Progress Notes (Signed)
Patient ID: Alexis Tucker, female   DOB: 10-17-1940, 77 y.o.   MRN: [id number]  Chief Complaint  Patient presents with  . Routine Post Op    HPI Alexis Tucker is a 77 y.o. female here today for her post op left breast lumpectomy done on 01/07/2018. Patient states she is doing well, still sore. She states she has been nauseated, vomited for 2 days post surgery iut no vomiting since. She admits to belching. Sh ehd a few phenergan on had but she has run out. She is here [name], daughter.  HPI  Past Medical History:  Diagnosis Date  . Anemia    per prior pcp  . Breast cancer ([place]) 2008   F/U radiation   . Breast cancer ([place]) 11/27/2017   left breast INVASIVE MAMMARY CARCINOMA  . Carotid stenosis, asymptomatic 01/2014   mild ICA, severe in ECA, rec rpt [place] 2 years  . Cataracts, bilateral   . CKD (chronic kidney disease) stage 4, GFR 15-29 ml/min ([facility]) 01/2012   Cr 3.85, in 3s since 2010, discussing HD and L forearm graft ([facility])  . Diabetes type 2, controlled ([place]) 2000s  . Diabetic retinopathy with macular edema ([place]) 03/2014   [place] [facility] eye on avastin  . DJD (degenerative joint disease)    per prior pcp  . GERD (gastroesophageal reflux disease)   . Glaucoma 03/2014  . History of breast cancer 2008   infiltrating ductal carcinoma s/p mammosite  . History of colon cancer 2006   s/p colectomy  . History of hepatitis A 1990s   from food, resolved  . HTN (hypertension)   . Macular degeneration    wet R with vision loss, dry L; to see retina specialist  . Neuropathy    per prior pcp  . OA (osteoarthritis)    knees and hips  . Osteonecrosis (H[facility])    hips?  . Personal history of radiation therapy 2008   F/U Left Breast Cancer  . PONV (postoperative nausea and vomiting)   . Post-surgical hypothyroidism   . Wears dentures    partial upper    Past Surgical History:  Procedure Laterality Date  . ABDOMINAL HYSTERECTOMY  1976   partial, after tubal pregnancy,  h/o fibroids with heavy bleeding  . BREAST BIOPSY Left 2008   positive  . BREAST BIOPSY Left 11/22/2017   2oclock 2cmfn wing shaped clip, INVASIVE MAMMARY CARCINOMA  . BREAST BIOPSY Left 11/22/2017   2oclock 4cmfn coil shaped clip, INVASIVE MAMMARY CARCINOMA  . BREAST BIOPSY Left 11/22/2017   lymph node - butterfly hydroMARK  . BREAST EXCISIONAL BIOPSY Left 01/07/2018   lumpectomy by Dr. Bary Castilla  . BREAST LUMPECTOMY Left 2008   F/U radiation  . BREAST LUMPECTOMY WITH SENTINEL LYMPH NODE BIOPSY Left 01/07/2018   Procedure: BREAST LUMPECTOMY WITH SENTINEL LYMPH NODE BX;  Surgeon: Robert Bellow, MD;  Location: [facility] ORS;  Service: General;  Laterality: Left;  . BREAST MAMMOSITE  2008  . carotid ultrasound  12/2012   1-39% bilateral stenosis, severe in ECA  . CATARACT EXTRACTION W/PHACO Left 08/14/2016   Procedure: CATARACT EXTRACTION PHACO AND INTRAOCULAR LENS PLACEMENT (IOC)  Left Diabetic;  Surgeon: Ronnell Freshwater, MD;  Location: [facility];  Service: Ophthalmology;  Laterality: Left;  Diabetic - oral meds  . CHOLECYSTECTOMY  2005  . COLECTOMY  2005   colon cancer  . COLON SURGERY  2006   bowel obstruction  . COLONOSCOPY  2008   ext hem, ileo-colonic anastomosis  in cecum, tubular adenoma x1, rec rpt 1 yr for multiple polyps (in [place])  . ESOPHAGOGASTRODUODENOSCOPY N/A 05/06/2016   Procedure: ESOPHAGOGASTRODUODENOSCOPY (EGD);  Surgeon: Manya Silvas, MD;  Location: [facility] ENDOSCOPY;  Service: Endoscopy;  Laterality: N/A;  . THYROIDECTOMY, PARTIAL     unclear why  . TOTAL KNEE ARTHROPLASTY Right 1999  . US ECHOCARDIOGRAPHY  11/2007   nl LV fxn EF 26%, diastolic dysfunction, LVH, tr TR and MR, slcerotic aortic valve    Family History  Problem Relation Age of Onset  . Diabetes Mother   . Hypertension Mother   . Arthritis Mother   . Alcohol abuse Father   . Arthritis Father   . Stroke Daughter   . CAD Neg Hx   . Cancer Neg Hx   . Breast cancer Neg Hx      Social History Social History   Tobacco Use  . Smoking status: Former Research scientist (life sciences)  . Smokeless tobacco: Never Used  . Tobacco comment: quit over 40 yrs ago  Substance Use Topics  . Alcohol use: Yes    Comment: rare  . Drug use: Never    Allergies  Allergen Reactions  . Tramadol Other (See Comments)    nightmares  . Codeine Anxiety  . Sulfa Antibiotics Itching and Rash    Current Outpatient Medications  Medication Sig Dispense Refill  . acetaminophen (TYLENOL) 325 MG tablet Take 1 tablet (325 mg total) by mouth every 6 (six) hours as needed for mild pain (or Fever >/= 101).    . Acetylcysteine 600 MG CAPS Take by mouth daily.    Marland Kitchen allopurinol (ZYLOPRIM) 100 MG tablet TAKE 1 TABLET BY MOUTH DAILY 90 tablet 0  . anastrozole (ARIMIDEX) 1 MG tablet Take 1 tablet (1 mg total) by mouth daily. 90 tablet 1  . aspirin EC 81 MG EC tablet Take 1 tablet (81 mg total) by mouth daily. 30 tablet 12  . Blood Glucose Monitoring Suppl (ACURA BLOOD GLUCOSE METER) W/DEVICE KIT Use as directed to check sugars daily 250.42 1 kit 0  . Calcium-Magnesium-Vitamin D (CALCIUM MAGNESIUM PO) Take 1 tablet by mouth daily.    . carvedilol (COREG) 25 MG tablet Take 1 tablet (25 mg total) by mouth daily. 90 tablet 1  . Cholecalciferol (VITAMIN D-3) 5000 UNITS TABS Take 1 tablet by mouth daily.    . diclofenac sodium (VOLTAREN) 1 % GEL APPLY 1 APPLICATION TOPICALLY 3 (THREE) TIMES DAILY. 100 g 0  . ferrous sulfate (SLOW IRON) 160 (50 Fe) MG TBCR SR tablet Take 1 tablet (160 mg total) by mouth daily. 90 each 3  . Folic Acid-Vit J3-HLK T62 (FOLBEE) 2.5-25-1 MG TABS tablet Take 1 tablet by mouth daily.    . isosorbide dinitrate (ISORDIL) 20 MG tablet Take 1 tablet (20 mg total) by mouth daily. 90 tablet 1  . latanoprost (XALATAN) 0.005 % ophthalmic solution Place 1 drop into both eyes at bedtime.    Marland Kitchen levothyroxine (SYNTHROID, LEVOTHROID) 88 MCG tablet Take 1 tablet (88 mcg total) by mouth daily. First week take 1/2  tablet daily then increase to 1 tablet daily 90 tablet 1  . pantoprazole (PROTONIX) 40 MG tablet Take 1 tablet (40 mg total) by mouth daily as needed. 90 tablet 1  . pentoxifylline (TRENTAL) 400 MG CR tablet Take 1 tablet (400 mg total) by mouth daily. 90 tablet 1  . saxagliptin HCl (ONGLYZA) 2.5 MG TABS tablet Take 1 tablet (2.5 mg total) by mouth daily. 30 tablet 6  .  sodium bicarbonate 650 MG tablet Take 650 mg by mouth 2 (two) times daily as needed for heartburn.    . promethazine (PHENERGAN) 12.5 MG tablet Take 1 tablet (12.5 mg total) by mouth every 6 (six) hours as needed for nausea or vomiting. 20 tablet 0   No current facility-administered medications for this visit.     Review of Systems Review of Systems  Constitutional: Negative.   Respiratory: Negative.   Cardiovascular: Negative.   Gastrointestinal: Positive for nausea.    Blood pressure (!) 146/80, pulse 70, resp. rate 16, height _0  (1.6 m), weight 213 lb (96.6 kg), SpO2 100 %.  Physical Exam Physical Exam  Constitutional: She is oriented to person, place, and time. She appears well-developed and well-nourished.  Cardiovascular: Normal rate, regular rhythm and normal heart sounds.  No lower leg edema  Pulmonary/Chest: Effort normal and breath sounds normal.    Abdominal: Soft. Normal appearance and bowel sounds are normal. There is no tenderness.  Neurological: She is alert and oriented to person, place, and time.  Skin: Skin is warm and dry.  Psychiatric: Her behavior is normal.    Data Reviewed DIAGNOSIS:  A. LEFT BREAST, 2:00; WIDE EXCISION:  - MULTIPLE FOCI OF INVASIVE MAMMARY CARCINOMA, NO SPECIAL TYPE.  - BIOPSY SITE CHANGES, MARKER CLIP PRESENT.  - SEE SUMMARY BELOW.   B. SENTINEL LYMPH NODES, #1,#2, #3; EXCISION:  - NO TUMOR SEEN IN THREE LYMPH NODES (0/3).   C. AXILLARY FAT, LEFT BREAST; EXCISION:  - NO TUMOR SEEN IN ADIPOSE TISSUE.    CANCER CASE SUMMARY: INVASIVE CARCINOMA OF THE BREAST   Procedure: Wide excision  Specimen Laterality: Left  Tumor Size: 25 mm  Histologic Type: Invasive carcinoma of no special type  Histologic Grade (Nottingham Histologic Score)            Glandular (Acinar)/TubularDifferentiation: 3            Nuclear Pleomorphism: 2            Mitotic Rate: 2            Overall Grade: 2  Ductal Carcinoma In Situ (DCIS): Present, mass A  Margins:  Invasive Carcinoma Margins: Uninvolved  Distance from closest margin: A. <1 mm medial; C. 7 mm deep; D. 12 mm  deep; E. 12 mm deep            DCIS Margins: Uninvolved by DCIS            Distance from closest margin: 9 mm            Specify closest margin: Inferior   Regional Lymph Nodes: Uninvolved by tumor cells  Number of Lymph Nodes Examined: 3  Number of Sentinel Nodes Examined: 3  Lymphovascular Invasion: Present  Pathologic Stage Classification (pTNM, AJCC 8th Edition): mpT2 pN0 (sn)  TNM Descriptors: m multiple foci of invasive carcinoma   Biomarkers were performed on a separate specimen in summary: ER and PR  positive, HER-2 FISH negative.   Note: Three distinct masses are seen measuring 2.5, 0.8 and 0.8 cm. A  fourth area is described and shows a 1 mm focus of lymphovascular  invasion in adipose tissue. As per CAP guidelines the largest tumor is  used for pathologic staging.    Ultrasound examination was completed to determine if the patient is a candidate for accelerated partial breast radiation in light of her previous treatment of the axillary tail area of the breast in the past.  There is a  1.3 x 3.1 x 4.8 cm seroma cavity approximately 3 cm deep, adequate spacing for balloon placement.  Assessment     Doing well status post excision of multiple lesions within the upper outer quadrant of the left breast.  Unexplained nausea.  Plan  Phenergan 12.5 mg q6h prn  Appointment with Radiation Oncologist, Dr  Donella Stade    HPI, Physical Exam, Assessment and Plan have been scribed under the direction and in the presence of Robert Bellow, MD. Karie Fetch, RN  [facility] Gleason Xxavier Noon 01/15/2018, 7:40 PM

## 2018-01-15 NOTE — Patient Instructions (Addendum)
The patient is aware to call back for any questions or concerns.  Appointment with Radiation Oncologist, Dr Donella Stade, possible mammosite  The patient is scheduled for Radiation oncology on 01/21/18 at 2:00 pm.

## 2018-01-21 ENCOUNTER — Ambulatory Visit: Payer: Medicare Other | Admitting: Radiation Oncology

## 2018-01-23 DIAGNOSIS — E113211 Type 2 diabetes mellitus with mild nonproliferative diabetic retinopathy with macular edema, right eye: Secondary | ICD-10-CM | POA: Diagnosis not present

## 2018-01-23 DIAGNOSIS — E113212 Type 2 diabetes mellitus with mild nonproliferative diabetic retinopathy with macular edema, left eye: Secondary | ICD-10-CM | POA: Diagnosis not present

## 2018-01-28 ENCOUNTER — Ambulatory Visit
Admission: RE | Admit: 2018-01-28 | Discharge: 2018-01-28 | Disposition: A | Discharge status: Home / Self Care | Payer: Medicare Other | Source: Ambulatory Visit | Attending: Radiation Oncology | Admitting: Radiation Oncology

## 2018-01-28 ENCOUNTER — Encounter: Payer: Self-pay | Admitting: Radiation Oncology

## 2018-01-28 ENCOUNTER — Other Ambulatory Visit: Payer: Self-pay

## 2018-01-28 VITALS — BP 175/77 | HR 72 | Temp 98.0°F | Resp 18 | Wt 215.7 lb

## 2018-01-28 DIAGNOSIS — M1990 Unspecified osteoarthritis, unspecified site: Secondary | ICD-10-CM | POA: Insufficient documentation

## 2018-01-28 DIAGNOSIS — C50412 Malignant neoplasm of upper-outer quadrant of left female breast: Secondary | ICD-10-CM | POA: Insufficient documentation

## 2018-01-28 DIAGNOSIS — Z888 Allergy status to other drugs, medicaments and biological substances status: Secondary | ICD-10-CM | POA: Diagnosis not present

## 2018-01-28 DIAGNOSIS — H3530 Unspecified macular degeneration: Secondary | ICD-10-CM | POA: Insufficient documentation

## 2018-01-28 DIAGNOSIS — Z7982 Long term (current) use of aspirin: Secondary | ICD-10-CM | POA: Insufficient documentation

## 2018-01-28 DIAGNOSIS — Z85038 Personal history of other malignant neoplasm of large intestine: Secondary | ICD-10-CM | POA: Insufficient documentation

## 2018-01-28 DIAGNOSIS — Z833 Family history of diabetes mellitus: Secondary | ICD-10-CM | POA: Insufficient documentation

## 2018-01-28 DIAGNOSIS — I129 Hypertensive chronic kidney disease with stage 1 through stage 4 chronic kidney disease, or unspecified chronic kidney disease: Secondary | ICD-10-CM | POA: Insufficient documentation

## 2018-01-28 DIAGNOSIS — H409 Unspecified glaucoma: Secondary | ICD-10-CM | POA: Insufficient documentation

## 2018-01-28 DIAGNOSIS — N184 Chronic kidney disease, stage 4 (severe): Secondary | ICD-10-CM | POA: Diagnosis not present

## 2018-01-28 DIAGNOSIS — K219 Gastro-esophageal reflux disease without esophagitis: Secondary | ICD-10-CM | POA: Diagnosis not present

## 2018-01-28 DIAGNOSIS — Z8249 Family history of ischemic heart disease and other diseases of the circulatory system: Secondary | ICD-10-CM | POA: Insufficient documentation

## 2018-01-28 DIAGNOSIS — Z923 Personal history of irradiation: Secondary | ICD-10-CM | POA: Insufficient documentation

## 2018-01-28 DIAGNOSIS — E11311 Type 2 diabetes mellitus with unspecified diabetic retinopathy with macular edema: Secondary | ICD-10-CM | POA: Insufficient documentation

## 2018-01-28 DIAGNOSIS — E1140 Type 2 diabetes mellitus with diabetic neuropathy, unspecified: Secondary | ICD-10-CM | POA: Diagnosis not present

## 2018-01-28 DIAGNOSIS — Z882 Allergy status to sulfonamides status: Secondary | ICD-10-CM | POA: Insufficient documentation

## 2018-01-28 DIAGNOSIS — Z170 Estrogen receptor positive status [ER+]: Secondary | ICD-10-CM | POA: Insufficient documentation

## 2018-01-28 DIAGNOSIS — Z885 Allergy status to narcotic agent status: Secondary | ICD-10-CM | POA: Insufficient documentation

## 2018-01-28 DIAGNOSIS — E1122 Type 2 diabetes mellitus with diabetic chronic kidney disease: Secondary | ICD-10-CM | POA: Insufficient documentation

## 2018-01-28 DIAGNOSIS — Z87891 Personal history of nicotine dependence: Secondary | ICD-10-CM | POA: Insufficient documentation

## 2018-01-28 DIAGNOSIS — Z79899 Other long term (current) drug therapy: Secondary | ICD-10-CM | POA: Insufficient documentation

## 2018-01-28 NOTE — Consult Note (Signed)
NEW PATIENT EVALUATION  Name: Alexis Tucker  MRN: [id number]  Date:   01/28/2018     DOB: Sep 07, 1940   This 77 y.o. 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  REFERRING PHYSICIAN: Ria Bush, MD  CHIEF COMPLAINT:  Chief Complaint  Patient presents with  . Breast Cancer    Pt is here for initial consultation of breast cancer.     DIAGNOSIS: The encounter diagnosis was Malignant neoplasm of upper-outer quadrant of left breast in female, estrogen receptor positive ([place]).   PREVIOUS INVESTIGATIONS:  Mammogram and ultrasound reviewed Clinical notes reviewed Pathology reports reviewed  HPI: patient is a 77 year old female status post accelerated partial breast irradiation back in 2008 for early stage invasive mammary carcinoma.she presented with a new nodule in the area of prior surgery and was seen for evaluation.mammogram demonstrated for suspicious masses in the 2:00 position for which biopsy was recommended.lesions were 4 cm from the nipple.ultrasound-guided core biopsy was positive for invasive mammary carcinoma and core biopsy of abnormal lymph node on ultrasound was negative for malignancy.she went on to have a wide local excision showing multiple foci of invasive mammary Carcinoma with 3 sentinel lymph nodes all negative for metastatic disease.tumor spanned 2.5 cm in overall grade of 2 with margins clear but close at 1 mm.tumor again was ER/PR positive HER-2/neu not overexpressed. She is tolerated her surgery well. Follow-up ultrasound shows a seroma capable of containing a MammoSite balloon catheter. She seen today for radiation oncology evaluation. Patient is intent on keeping her breast has denied mastectomy.she  specifically denies breast tenderness cough or bone pain.  PLANNED TREATMENT REGIMEN: accelerated partial breast irradiation to the left breast  PAST MEDICAL HISTORY:  has a past medical history of Anemia, Breast cancer ([place]) (2008), Breast cancer ([place]) (11/27/2017), Carotid stenosis, asymptomatic (01/2014), Cataracts, bilateral, CKD (chronic kidney disease) stage 4, GFR 15-29 ml/min ([place]) (01/2012), Diabetes type 2, controlled ([place]) (2000s), Diabetic retinopathy with macular edema ([place]) (03/2014), DJD (degenerative joint disease), GERD (gastroesophageal reflux disease), Glaucoma (03/2014), History of breast cancer (2008), History of colon cancer (2006), History of hepatitis A (1990s), HTN (hypertension), Macular degeneration, Neuropathy, OA (osteoarthritis), Osteonecrosis ([place]), Personal history of radiation therapy (2008), PONV (postoperative nausea and vomiting), Post-surgical hypothyroidism, and Wears dentures.    PAST SURGICAL HISTORY:  Past Surgical History:  Procedure Laterality Date  . ABDOMINAL HYSTERECTOMY  1976   partial, after tubal pregnancy, h/o fibroids with heavy bleeding  . BREAST BIOPSY Left 2008   positive  . BREAST BIOPSY Left 11/22/2017   2oclock 2cmfn wing shaped clip, INVASIVE MAMMARY CARCINOMA  . BREAST BIOPSY Left 11/22/2017   2oclock 4cmfn coil shaped clip, INVASIVE MAMMARY CARCINOMA  . BREAST BIOPSY Left 11/22/2017   lymph node - butterfly hydroMARK  . BREAST EXCISIONAL BIOPSY Left 01/07/2018   lumpectomy by Dr. Bary Castilla  . BREAST LUMPECTOMY Left 2008   F/U radiation  . BREAST LUMPECTOMY WITH SENTINEL LYMPH NODE BIOPSY Left 01/07/2018   Procedure: BREAST LUMPECTOMY WITH SENTINEL LYMPH NODE BX;  Surgeon: Robert Bellow, MD;  Location: [facility] ORS;  Service: General;  Laterality: Left;  . BREAST MAMMOSITE  2008  . carotid ultrasound  12/2012   1-39% bilateral stenosis, severe in ECA  . CATARACT EXTRACTION W/PHACO Left 08/14/2016  Procedure: CATARACT EXTRACTION PHACO AND  INTRAOCULAR LENS PLACEMENT (IOC)  Left Diabetic;  Surgeon: Ronnell Freshwater, MD;  Location: [place];  Service: Ophthalmology;  Laterality: Left;  Diabetic - oral meds  . CHOLECYSTECTOMY  2005  . COLECTOMY  2005   colon cancer  . COLON SURGERY  2006   bowel obstruction  . COLONOSCOPY  2008   ext hem, ileo-colonic anastomosis in cecum, tubular adenoma x1, rec rpt 1 yr for multiple polyps (in [place])  . ESOPHAGOGASTRODUODENOSCOPY N/A 05/06/2016   Procedure: ESOPHAGOGASTRODUODENOSCOPY (EGD);  Surgeon: Manya Silvas, MD;  Location: [facility] ENDOSCOPY;  Service: Endoscopy;  Laterality: N/A;  . THYROIDECTOMY, PARTIAL     unclear why  . TOTAL KNEE ARTHROPLASTY Right 1999  . US ECHOCARDIOGRAPHY  11/2007   nl LV fxn EF 98%, diastolic dysfunction, LVH, tr TR and MR, slcerotic aortic valve    FAMILY HISTORY: family history includes Alcohol abuse in her father; Arthritis in her father and mother; Diabetes in her mother; Hypertension in her mother; Stroke in her daughter.  SOCIAL HISTORY:  reports that she has quit smoking. She has never used smokeless tobacco. She reports that she drinks alcohol. She reports that she does not use drugs.  ALLERGIES: Tramadol; Codeine; and Sulfa antibiotics  MEDICATIONS:  Current Outpatient Medications  Medication Sig Dispense Refill  . acetaminophen (TYLENOL) 325 MG tablet Take 1 tablet (325 mg total) by mouth every 6 (six) hours as needed for mild pain (or Fever >/= 101).    . Acetylcysteine 600 MG CAPS Take by mouth daily.    Marland Kitchen allopurinol (ZYLOPRIM) 100 MG tablet TAKE 1 TABLET BY MOUTH DAILY 90 tablet 0  . anastrozole (ARIMIDEX) 1 MG tablet Take 1 tablet (1 mg total) by mouth daily. 90 tablet 1  . aspirin EC 81 MG EC tablet Take 1 tablet (81 mg total) by mouth daily. 30 tablet 12  . Blood Glucose Monitoring Suppl (ACURA BLOOD GLUCOSE METER) W/DEVICE KIT Use as directed to check sugars daily 250.42 1 kit 0  . Calcium-Magnesium-Vitamin D (CALCIUM  MAGNESIUM PO) Take 1 tablet by mouth daily.    . carvedilol (COREG) 25 MG tablet Take 1 tablet (25 mg total) by mouth daily. 90 tablet 1  . Cholecalciferol (VITAMIN D-3) 5000 UNITS TABS Take 1 tablet by mouth daily.    . diclofenac sodium (VOLTAREN) 1 % GEL APPLY 1 APPLICATION TOPICALLY 3 (THREE) TIMES DAILY. 100 g 0  . ferrous sulfate (SLOW IRON) 160 (50 Fe) MG TBCR SR tablet Take 1 tablet (160 mg total) by mouth daily. 90 each 3  . Folic Acid-Vit P3-ASN K53 (FOLBEE) 2.5-25-1 MG TABS tablet Take 1 tablet by mouth daily.    . isosorbide dinitrate (ISORDIL) 20 MG tablet Take 1 tablet (20 mg total) by mouth daily. 90 tablet 1  . latanoprost (XALATAN) 0.005 % ophthalmic solution Place 1 drop into both eyes at bedtime.    Marland Kitchen levothyroxine (SYNTHROID, LEVOTHROID) 88 MCG tablet Take 1 tablet (88 mcg total) by mouth daily. First week take 1/2 tablet daily then increase to 1 tablet daily 90 tablet 1  . pantoprazole (PROTONIX) 40 MG tablet Take 1 tablet (40 mg total) by mouth daily as needed. 90 tablet 1  . pentoxifylline (TRENTAL) 400 MG CR tablet Take 1 tablet (400 mg total) by mouth daily. 90 tablet 1  . promethazine (PHENERGAN) 12.5 MG tablet Take 1 tablet (12.5 mg total) by mouth every 6 (six) hours as needed for nausea or vomiting. [place]  tablet 0  . saxagliptin HCl (ONGLYZA) 2.5 MG TABS tablet Take 1 tablet (2.5 mg total) by mouth daily. 30 tablet 6  . sodium bicarbonate 650 MG tablet Take 650 mg by mouth 2 (two) times daily as needed for heartburn.     No current facility-administered medications for this encounter.     ECOG PERFORMANCE STATUS:  0 - Asymptomatic  REVIEW OF SYSTEMS:  Patient denies any weight loss, fatigue, weakness, fever, chills or night sweats. Patient denies any loss of vision, blurred vision. Patient denies any ringing  of the ears or hearing loss. No irregular heartbeat. Patient denies heart murmur or history of fainting. Patient denies any chest pain or pain radiating to her  upper extremities. Patient denies any shortness of breath, difficulty breathing at night, cough or hemoptysis. Patient denies any swelling in the lower legs. Patient denies any nausea vomiting, vomiting of blood, or coffee ground material in the vomitus. Patient denies any stomach pain. Patient states has had normal bowel movements no significant constipation or diarrhea. Patient denies any dysuria, hematuria or significant nocturia. Patient denies any problems walking, swelling in the joints or loss of balance. Patient denies any skin changes, loss of hair or loss of weight. Patient denies any excessive worrying or anxiety or significant depression. Patient denies any problems with insomnia. Patient denies excessive thirst, polyuria, polydipsia. Patient denies any swollen glands, patient denies easy bruising or easy bleeding. Patient denies any recent infections, allergies or URI. Patient "s visual fields have not changed significantly in recent time.    PHYSICAL EXAM: BP (!) 175/77   Pulse 72   Temp 98 F (36.7 C)   Resp 18   Wt 215 lb 11.5 oz (97.8 kg)   BMI 38.21 kg/m  Left breast is wide local excision scar which is healing well no dominant mass or nodularity is noted in either breast in 2 positions examined. No axillary or supraclavicular adenopathy is noted.Well-developed well-nourished patient in NAD. HEENT reveals PERLA, EOMI, discs not visualized.  Oral cavity is clear. No oral mucosal lesions are identified. Neck is clear without evidence of cervical or supraclavicular adenopathy. Lungs are clear to A&P. Cardiac examination is essentially unremarkable with regular rate and rhythm without murmur rub or thrill. Abdomen is benign with no organomegaly or masses noted. Motor sensory and DTR levels are equal and symmetric in the upper and lower extremities. Cranial nerves II through XII are grossly intact. Proprioception is intact. No peripheral adenopathy or edema is identified. No motor or sensory  levels are noted. Crude visual fields are within normal range.  LABORATORY DATA: pathology reports reviewed    RADIOLOGY RESULTS:mammogram and ultrasound reviewed   IMPRESSION: stage II (T2 N0 M0 those [place]rens recurrent invasive mammary carcinoma left breast and patient undergoing accelerated partial breast radiation in 2008  PLAN: at this time I believe it's feasible to go ahead with accelerated partial breast radiation to her left breast. Again would plan on delivering 3400 cGy in 10 fractions at 340 C twice a day. Risks and benefits of accelerated partial breast radiation including thickening of the lumpectomy site small area of skin reaction fatigue alteration of blood counts and the possibility that based on her prior brachy therapy she may 1. Need mastectomy should there be breakdown or significant fat necrosis. We've contacted Dr. Barbette Hair office and he will make arrangements for MammoSite balloon placement in the near future. Patient also will be candidate for antiestrogen therapy after completion of radiation.  I would like  to take this opportunity to thank you for allowing me to participate in the care of your patient.Noreene Filbert, MD

## 2018-01-30 ENCOUNTER — Telehealth: Payer: Self-pay | Admitting: *Deleted

## 2018-01-30 MED ORDER — CEFADROXIL 500 MG PO CAPS: 500.0000 mg | ORAL_CAPSULE | Freq: Two times a day (BID) | ORAL | 0 refills | Status: DC

## 2018-01-30 NOTE — Telephone Encounter (Signed)
Mammosite schedule reviewed with the patient Placement 02-06-18 at [facility] at 3:00 Scan 02-08-18 Treat 02-11-18 to 02-15-18 Aware the [place] will be calling her for more details Aware of ATB and directions reviewed. Aware no showers and to wear her bra while mammosite in place. Pt agrees.

## 2018-02-06 ENCOUNTER — Encounter: Payer: Self-pay | Admitting: General Surgery

## 2018-02-06 ENCOUNTER — Ambulatory Visit: Payer: Self-pay

## 2018-02-06 ENCOUNTER — Ambulatory Visit (INDEPENDENT_AMBULATORY_CARE_PROVIDER_SITE_OTHER): Payer: Medicare Other | Admitting: General Surgery

## 2018-02-06 VITALS — BP 132/64 | HR 73 | Resp 16 | Ht 63.0 in | Wt 216.0 lb

## 2018-02-06 DIAGNOSIS — C50412 Malignant neoplasm of upper-outer quadrant of left female breast: Secondary | ICD-10-CM | POA: Diagnosis not present

## 2018-02-06 DIAGNOSIS — Z170 Estrogen receptor positive status [ER+]: Principal | ICD-10-CM

## 2018-02-06 NOTE — Progress Notes (Signed)
Patient ID: Alexis Tucker, female   DOB: [birth year], 77 y.o.   MRN: [id number]  Chief Complaint  Patient presents with  . Procedure    HPI Alexis Tucker is a 77 y.o. female. here for left mammosite.  HPI  Past Medical History:  Diagnosis Date  . Anemia    per prior pcp  . Breast cancer ([place]) 2008   F/U radiation   . Breast cancer ([place]) 11/27/2017   left breast INVASIVE MAMMARY CARCINOMA  . Carotid stenosis, asymptomatic 01/2014   mild ICA, severe in ECA, rec rpt [place] 2 years  . Cataracts, bilateral   . CKD (chronic kidney disease) stage 4, GFR 15-29 ml/min ([facility]) 01/2012   Cr 3.85, in 3s since 2010, discussing HD and L forearm graft ([place])  . Diabetes type 2, controlled ([place]) 2000s  . Diabetic retinopathy with macular edema ([place]) 03/2014   [place] at [facility] eye on avastin  . DJD (degenerative joint disease)    per prior pcp  . GERD (gastroesophageal reflux disease)   . Glaucoma 03/2014  . History of breast cancer 2008   infiltrating ductal carcinoma s/p mammosite  . History of colon cancer 2006   s/p colectomy  . History of hepatitis A 1990s   from food, resolved  . HTN (hypertension)   . Macular degeneration    wet R with vision loss, dry L; to see retina specialist  . Neuropathy    per prior pcp  . OA (osteoarthritis)    knees and hips  . Osteonecrosis ([facility])    hips?  . Personal history of radiation therapy 2008   F/U Left Breast Cancer  . PONV (postoperative nausea and vomiting)   . Post-surgical hypothyroidism   . Wears dentures    partial upper    Past Surgical History:  Procedure Laterality Date  . ABDOMINAL HYSTERECTOMY  1976   partial, after tubal pregnancy, h/o fibroids with heavy bleeding  . BREAST BIOPSY Left 2008   positive  . BREAST BIOPSY Left 11/22/2017   2oclock 2cmfn wing shaped clip, INVASIVE MAMMARY CARCINOMA  . BREAST BIOPSY Left 11/22/2017   2oclock 4cmfn coil shaped clip, INVASIVE MAMMARY CARCINOMA  . BREAST BIOPSY Left  11/22/2017   lymph node - butterfly hydroMARK  . BREAST EXCISIONAL BIOPSY Left 01/07/2018   lumpectomy by Dr. Bary Castilla  . BREAST LUMPECTOMY Left 2008   F/U radiation  . BREAST LUMPECTOMY WITH SENTINEL LYMPH NODE BIOPSY Left 01/07/2018   Procedure: BREAST LUMPECTOMY WITH SENTINEL LYMPH NODE BX;  Surgeon: Robert Bellow, MD;  Location: [facility] ORS;  Service: General;  Laterality: Left;  . BREAST MAMMOSITE  2008  . carotid ultrasound  12/2012   1-39% bilateral stenosis, severe in ECA  . CATARACT EXTRACTION W/PHACO Left 08/14/2016   Procedure: CATARACT EXTRACTION PHACO AND INTRAOCULAR LENS PLACEMENT (IOC)  Left Diabetic;  Surgeon: Ronnell Freshwater, MD;  Location: [place];  Service: Ophthalmology;  Laterality: Left;  Diabetic - oral meds  . CHOLECYSTECTOMY  2005  . COLECTOMY  2005   colon cancer  . COLON SURGERY  2006   bowel obstruction  . COLONOSCOPY  2008   ext hem, ileo-colonic anastomosis in cecum, tubular adenoma x1, rec rpt 1 yr for multiple polyps (in [place])  . ESOPHAGOGASTRODUODENOSCOPY N/A 05/06/2016   Procedure: ESOPHAGOGASTRODUODENOSCOPY (EGD);  Surgeon: Manya Silvas, MD;  Location: [facility] ENDOSCOPY;  Service: Endoscopy;  Laterality: N/A;  . THYROIDECTOMY, PARTIAL     unclear why  . TOTAL KNEE ARTHROPLASTY Right  1999  . US ECHOCARDIOGRAPHY  11/2007   nl LV fxn EF 85%, diastolic dysfunction, LVH, tr TR and MR, slcerotic aortic valve    Family History  Problem Relation Age of Onset  . Diabetes Mother   . Hypertension Mother   . Arthritis Mother   . Alcohol abuse Father   . Arthritis Father   . Stroke Daughter   . CAD Neg Hx   . Cancer Neg Hx   . Breast cancer Neg Hx     Social History Social History   Tobacco Use  . Smoking status: Former Research scientist (life sciences)  . Smokeless tobacco: Never Used  . Tobacco comment: quit over 40 yrs ago  Substance Use Topics  . Alcohol use: Yes    Comment: rare  . Drug use: Never    Allergies  Allergen Reactions  . Tramadol  Other (See Comments)    nightmares  . Codeine Anxiety  . Sulfa Antibiotics Itching and Rash    Current Outpatient Medications  Medication Sig Dispense Refill  . acetaminophen (TYLENOL) 325 MG tablet Take 1 tablet (325 mg total) by mouth every 6 (six) hours as needed for mild pain (or Fever >/= 101).    . Acetylcysteine 600 MG CAPS Take by mouth daily.    Marland Kitchen allopurinol (ZYLOPRIM) 100 MG tablet TAKE 1 TABLET BY MOUTH DAILY 90 tablet 0  . anastrozole (ARIMIDEX) 1 MG tablet Take 1 tablet (1 mg total) by mouth daily. 90 tablet 1  . aspirin EC 81 MG EC tablet Take 1 tablet (81 mg total) by mouth daily. 30 tablet 12  . Blood Glucose Monitoring Suppl (ACURA BLOOD GLUCOSE METER) W/DEVICE KIT Use as directed to check sugars daily 250.42 1 kit 0  . Calcium-Magnesium-Vitamin D (CALCIUM MAGNESIUM PO) Take 1 tablet by mouth daily.    . carvedilol (COREG) 25 MG tablet Take 1 tablet (25 mg total) by mouth daily. 90 tablet 1  . cefadroxil (DURICEF) 500 MG capsule Take 1 capsule (500 mg total) by mouth 2 (two) times daily. Start one hour(2:00) before office procedure on 02-06-18 20 capsule 0  . Cholecalciferol (VITAMIN D-3) 5000 UNITS TABS Take 1 tablet by mouth daily.    . diclofenac sodium (VOLTAREN) 1 % GEL APPLY 1 APPLICATION TOPICALLY 3 (THREE) TIMES DAILY. 100 g 0  . ferrous sulfate (SLOW IRON) 160 (50 Fe) MG TBCR SR tablet Take 1 tablet (160 mg total) by mouth daily. 90 each 3  . Folic Acid-Vit I6-EVO J50 (FOLBEE) 2.5-25-1 MG TABS tablet Take 1 tablet by mouth daily.    . isosorbide dinitrate (ISORDIL) 20 MG tablet Take 1 tablet (20 mg total) by mouth daily. 90 tablet 1  . latanoprost (XALATAN) 0.005 % ophthalmic solution Place 1 drop into both eyes at bedtime.    Marland Kitchen levothyroxine (SYNTHROID, LEVOTHROID) 88 MCG tablet Take 1 tablet (88 mcg total) by mouth daily. First week take 1/2 tablet daily then increase to 1 tablet daily 90 tablet 1  . pantoprazole (PROTONIX) 40 MG tablet Take 1 tablet (40 mg total)  by mouth daily as needed. 90 tablet 1  . pentoxifylline (TRENTAL) 400 MG CR tablet Take 1 tablet (400 mg total) by mouth daily. 90 tablet 1  . promethazine (PHENERGAN) 12.5 MG tablet Take 1 tablet (12.5 mg total) by mouth every 6 (six) hours as needed for nausea or vomiting. 20 tablet 0  . saxagliptin HCl (ONGLYZA) 2.5 MG TABS tablet Take 1 tablet (2.5 mg total) by mouth daily. 30 tablet 6  .  sodium bicarbonate 650 MG tablet Take 650 mg by mouth 2 (two) times daily as needed for heartburn.     No current facility-administered medications for this visit.     Review of Systems Review of Systems  Constitutional: Negative.   Respiratory: Negative.   Cardiovascular: Negative.     Blood pressure 132/64, pulse 73, resp. rate 16, height '5\' 3"'$  (1.6 m), weight 216 lb (98 kg), SpO2 98 %.  Physical Exam Physical Exam  Constitutional: She is oriented to person, place, and time. She appears well-developed and well-nourished.  Pulmonary/Chest:    Neurological: She is alert and oriented to person, place, and time.  Skin: Skin is warm and dry.  Psychiatric: Her behavior is normal.    Data Reviewed Ultrasound showed a suitable cavity in the 2 o'clock position of the left breast for MammoSite balloon placement.  The procedure was reviewed with the patient.  ChloraPrep was applied to the skin.  10 cc of 0.5% Xylocaine with 0.25% Marcaine with 1 to 200,000 units of epinephrine was utilized and well-tolerated.  The area was recleansed with ChloraPrep and draped.  A skin incision was made with an 11 blade followed by advancement of an 8 mm trocar into the seroma cavity.  About 50 cc of odorless serosanguineous fluid was obtained.  The cavity evaluation device was then advanced under ultrasound guidance into the upper outer quadrant of the breast center just at and above the level of the areola.  This was inflated to 50 cc with saline showing spherical insufflation.  An adequate superficial buffer of 2.39  cm.  The treatment balloon was inflated to 70 cc and was found to be spherical.  This was then swapped for the cavity evaluation device and filled with a mixture of saline and Omnipaque.  50 cc fill volume.  Spherical insufflation.  Minimal distance of the skin 2.33 cm.  The balloon in its deepest portion was adjacent to but not deforming the pectoralis muscle.  Bacitracin was applied to the balloon exit site followed by a gauze dressing.  The patient was instructed in wound care.  She will continue her Duricef as previously prescribed.   Assessment    Good tolerance of MammoSite balloon placement.    Plan    Follow up in 2 weeks     HPI, Physical Exam, Assessment and Plan have been scribed under the direction and in the presence of Robert Bellow, MD. Karie Fetch, RN  I have completed the exam and reviewed the above documentation for accuracy and completeness.  I agree with the above.  Haematologist has been used and any errors in dictation or transcription are unintentional.  Hervey Ard, M.D., F.A.C.S.   Alexis Tucker 02/06/2018, 5:40 PM

## 2018-02-06 NOTE — Patient Instructions (Addendum)
Take the antibiotic twice a day until finished all tablets    Patient care kit given to patient.  Instructed no showers, sponge bath while mammosite in place, take antibiotic. Follow up with [place] as arranged. Discussed wearing your bra for support at all times.

## 2018-02-08 ENCOUNTER — Ambulatory Visit
Admission: RE | Admit: 2018-02-08 | Discharge: 2018-02-08 | Disposition: A | Discharge status: Home / Self Care | Payer: Medicare Other | Source: Ambulatory Visit | Attending: Radiation Oncology | Admitting: Radiation Oncology

## 2018-02-08 DIAGNOSIS — C50412 Malignant neoplasm of upper-outer quadrant of left female breast: Secondary | ICD-10-CM | POA: Insufficient documentation

## 2018-02-08 DIAGNOSIS — Z170 Estrogen receptor positive status [ER+]: Secondary | ICD-10-CM | POA: Diagnosis not present

## 2018-02-11 ENCOUNTER — Ambulatory Visit
Admission: RE | Admit: 2018-02-11 | Discharge: 2018-02-11 | Disposition: A | Discharge status: Home / Self Care | Payer: Medicare Other | Source: Ambulatory Visit | Attending: Radiation Oncology | Admitting: Radiation Oncology

## 2018-02-11 DIAGNOSIS — Z170 Estrogen receptor positive status [ER+]: Secondary | ICD-10-CM | POA: Diagnosis not present

## 2018-02-11 DIAGNOSIS — C50412 Malignant neoplasm of upper-outer quadrant of left female breast: Secondary | ICD-10-CM | POA: Diagnosis not present

## 2018-02-12 ENCOUNTER — Ambulatory Visit
Admission: RE | Admit: 2018-02-12 | Discharge: 2018-02-12 | Disposition: A | Discharge status: Home / Self Care | Payer: Medicare Other | Source: Ambulatory Visit | Attending: Radiation Oncology | Admitting: Radiation Oncology

## 2018-02-12 ENCOUNTER — Other Ambulatory Visit: Payer: Self-pay | Admitting: Family Medicine

## 2018-02-12 DIAGNOSIS — C50412 Malignant neoplasm of upper-outer quadrant of left female breast: Secondary | ICD-10-CM | POA: Diagnosis not present

## 2018-02-12 DIAGNOSIS — Z170 Estrogen receptor positive status [ER+]: Secondary | ICD-10-CM | POA: Diagnosis not present

## 2018-02-13 ENCOUNTER — Ambulatory Visit
Admission: RE | Admit: 2018-02-13 | Discharge: 2018-02-13 | Disposition: A | Discharge status: Home / Self Care | Payer: Medicare Other | Source: Ambulatory Visit | Attending: Radiation Oncology | Admitting: Radiation Oncology

## 2018-02-13 DIAGNOSIS — C50412 Malignant neoplasm of upper-outer quadrant of left female breast: Secondary | ICD-10-CM | POA: Diagnosis not present

## 2018-02-13 DIAGNOSIS — Z170 Estrogen receptor positive status [ER+]: Secondary | ICD-10-CM | POA: Diagnosis not present

## 2018-02-14 ENCOUNTER — Ambulatory Visit
Admission: RE | Admit: 2018-02-14 | Discharge: 2018-02-14 | Disposition: A | Discharge status: Home / Self Care | Payer: Medicare Other | Source: Ambulatory Visit | Attending: Radiation Oncology | Admitting: Radiation Oncology

## 2018-02-14 DIAGNOSIS — C50412 Malignant neoplasm of upper-outer quadrant of left female breast: Secondary | ICD-10-CM | POA: Diagnosis not present

## 2018-02-14 DIAGNOSIS — Z170 Estrogen receptor positive status [ER+]: Secondary | ICD-10-CM | POA: Diagnosis not present

## 2018-02-15 ENCOUNTER — Ambulatory Visit
Admission: RE | Admit: 2018-02-15 | Discharge: 2018-02-15 | Disposition: A | Discharge status: Home / Self Care | Payer: Medicare Other | Source: Ambulatory Visit | Attending: Radiation Oncology | Admitting: Radiation Oncology

## 2018-02-15 DIAGNOSIS — C50412 Malignant neoplasm of upper-outer quadrant of left female breast: Secondary | ICD-10-CM | POA: Diagnosis not present

## 2018-02-15 DIAGNOSIS — Z170 Estrogen receptor positive status [ER+]: Secondary | ICD-10-CM | POA: Diagnosis not present

## 2018-02-19 ENCOUNTER — Encounter: Payer: Self-pay | Admitting: General Surgery

## 2018-02-19 ENCOUNTER — Ambulatory Visit (INDEPENDENT_AMBULATORY_CARE_PROVIDER_SITE_OTHER): Payer: Medicare Other | Admitting: General Surgery

## 2018-02-19 VITALS — BP 112/68 | HR 69 | Ht 65.0 in | Wt 212.0 lb

## 2018-02-19 DIAGNOSIS — Z170 Estrogen receptor positive status [ER+]: Secondary | ICD-10-CM

## 2018-02-19 DIAGNOSIS — C50412 Malignant neoplasm of upper-outer quadrant of left female breast: Secondary | ICD-10-CM | POA: Diagnosis not present

## 2018-02-19 NOTE — Progress Notes (Signed)
Patient ID: Alexis Tucker, female   DOB: 12-18-1940, 77 y.o.   MRN: [id number]  Chief Complaint  Patient presents with  . Follow-up    HPI Alexis Tucker is a 77 y.o. female here today follow up from her mammosite that was placed on 02/06/2018 and breast check Daughter, Santiago Glad is present at visit. Marland Kitchen  HPI  Past Medical History:  Diagnosis Date  . Anemia    per prior pcp  . Breast cancer ([place]) 2008   F/U radiation   . Breast cancer ([place]) 11/27/2017   left breast INVASIVE MAMMARY CARCINOMA  . Carotid stenosis, asymptomatic 01/2014   mild ICA, severe in ECA, rec rpt [place] 2 years  . Cataracts, bilateral   . CKD (chronic kidney disease) stage 4, GFR 15-29 ml/min (HCC) 01/2012   Cr 3.85, in 3s since 2010, discussing HD and L forearm graft (Singh/Schnier)  . Diabetes type 2, controlled ([place]) 2000s  . Diabetic retinopathy with macular edema ([place]) 03/2014   [place] at [facility] eye on avastin  . DJD (degenerative joint disease)    per prior pcp  . GERD (gastroesophageal reflux disease)   . Glaucoma 03/2014  . History of breast cancer 2008   infiltrating ductal carcinoma s/p mammosite  . History of colon cancer 2006   s/p colectomy  . History of hepatitis A 1990s   from food, resolved  . HTN (hypertension)   . Macular degeneration    wet R with vision loss, dry L; to see retina specialist  . Neuropathy    per prior pcp  . OA (osteoarthritis)    knees and hips  . Osteonecrosis (HCC)    hips?  . Personal history of radiation therapy 2008   F/U Left Breast Cancer  . PONV (postoperative nausea and vomiting)   . Post-surgical hypothyroidism   . Wears dentures    partial upper    Past Surgical History:  Procedure Laterality Date  . ABDOMINAL HYSTERECTOMY  1976   partial, after tubal pregnancy, h/o fibroids with heavy bleeding  . BREAST BIOPSY Left 2008   positive  . BREAST BIOPSY Left 11/22/2017   2oclock 2cmfn wing shaped clip, INVASIVE MAMMARY CARCINOMA  . BREAST BIOPSY Left  11/22/2017   2oclock 4cmfn coil shaped clip, INVASIVE MAMMARY CARCINOMA  . BREAST BIOPSY Left 11/22/2017   lymph node - butterfly hydroMARK  . BREAST EXCISIONAL BIOPSY Left 01/07/2018   lumpectomy by Dr. Bary Castilla  . BREAST LUMPECTOMY Left 2008   F/U radiation  . BREAST LUMPECTOMY WITH SENTINEL LYMPH NODE BIOPSY Left 01/07/2018   Procedure: BREAST LUMPECTOMY WITH SENTINEL LYMPH NODE BX;  Surgeon: Robert Bellow, MD;  Location: [facility] ORS;  Service: General;  Laterality: Left;  . BREAST MAMMOSITE  2008  . carotid ultrasound  12/2012   1-39% bilateral stenosis, severe in ECA  . CATARACT EXTRACTION W/PHACO Left 08/14/2016   Procedure: CATARACT EXTRACTION PHACO AND INTRAOCULAR LENS PLACEMENT (IOC)  Left Diabetic;  Surgeon: Ronnell Freshwater, MD;  Location: [place];  Service: Ophthalmology;  Laterality: Left;  Diabetic - oral meds  . CHOLECYSTECTOMY  2005  . COLECTOMY  2005   colon cancer  . COLON SURGERY  2006   bowel obstruction  . COLONOSCOPY  2008   ext hem, ileo-colonic anastomosis in cecum, tubular adenoma x1, rec rpt 1 yr for multiple polyps (in [place])  . ESOPHAGOGASTRODUODENOSCOPY N/A 05/06/2016   Procedure: ESOPHAGOGASTRODUODENOSCOPY (EGD);  Surgeon: Manya Silvas, MD;  Location: [facility] ENDOSCOPY;  Service: Endoscopy;  Laterality: N/A;  . THYROIDECTOMY, PARTIAL     unclear why  . TOTAL KNEE ARTHROPLASTY Right 1999  . US ECHOCARDIOGRAPHY  11/2007   nl LV fxn EF 54%, diastolic dysfunction, LVH, tr TR and MR, slcerotic aortic valve    Family History  Problem Relation Age of Onset  . Diabetes Mother   . Hypertension Mother   . Arthritis Mother   . Alcohol abuse Father   . Arthritis Father   . Stroke Daughter   . CAD Neg Hx   . Cancer Neg Hx   . Breast cancer Neg Hx     Social History Social History   Tobacco Use  . Smoking status: Former Research scientist (life sciences)  . Smokeless tobacco: Never Used  . Tobacco comment: quit over 40 yrs ago  Substance Use Topics  . Alcohol use:  Yes    Comment: rare  . Drug use: Never    Allergies  Allergen Reactions  . Tramadol Other (See Comments)    nightmares  . Codeine Anxiety  . Sulfa Antibiotics Itching and Rash    Current Outpatient Medications  Medication Sig Dispense Refill  . acetaminophen (TYLENOL) 325 MG tablet Take 1 tablet (325 mg total) by mouth every 6 (six) hours as needed for mild pain (or Fever >/= 101).    . Acetylcysteine 600 MG CAPS Take by mouth daily.    Marland Kitchen allopurinol (ZYLOPRIM) 100 MG tablet TAKE 1 TABLET BY MOUTH DAILY 90 tablet 0  . anastrozole (ARIMIDEX) 1 MG tablet Take 1 tablet (1 mg total) by mouth daily. 90 tablet 1  . aspirin EC 81 MG EC tablet Take 1 tablet (81 mg total) by mouth daily. 30 tablet 12  . Blood Glucose Monitoring Suppl (ACURA BLOOD GLUCOSE METER) W/DEVICE KIT Use as directed to check sugars daily 250.42 1 kit 0  . Calcium-Magnesium-Vitamin D (CALCIUM MAGNESIUM PO) Take 1 tablet by mouth daily.    . carvedilol (COREG) 25 MG tablet Take 1 tablet (25 mg total) by mouth daily. 90 tablet 1  . cefadroxil (DURICEF) 500 MG capsule Take 1 capsule (500 mg total) by mouth 2 (two) times daily. Start one hour(2:00) before office procedure on 02-06-18 20 capsule 0  . Cholecalciferol (VITAMIN D-3) 5000 UNITS TABS Take 1 tablet by mouth daily.    . diclofenac sodium (VOLTAREN) 1 % GEL APPLY 1 APPLICATION TOPICALLY 3 (THREE) TIMES DAILY. 100 g 0  . ferrous sulfate (SLOW IRON) 160 (50 Fe) MG TBCR SR tablet Take 1 tablet (160 mg total) by mouth daily. 90 each 3  . Folic Acid-Vit U9-WJX B14 (FOLBEE) 2.5-25-1 MG TABS tablet Take 1 tablet by mouth daily.    . isosorbide dinitrate (ISORDIL) 20 MG tablet Take 1 tablet (20 mg total) by mouth daily. 90 tablet 1  . latanoprost (XALATAN) 0.005 % ophthalmic solution Place 1 drop into both eyes at bedtime.    Marland Kitchen levothyroxine (SYNTHROID, LEVOTHROID) 88 MCG tablet Take 1 tablet (88 mcg total) by mouth daily. First week take 1/2 tablet daily then increase to 1  tablet daily 90 tablet 1  . pantoprazole (PROTONIX) 40 MG tablet Take 1 tablet (40 mg total) by mouth daily as needed. 90 tablet 1  . pentoxifylline (TRENTAL) 400 MG CR tablet Take 1 tablet (400 mg total) by mouth daily. 90 tablet 1  . promethazine (PHENERGAN) 12.5 MG tablet Take 1 tablet (12.5 mg total) by mouth every 6 (six) hours as needed for nausea or vomiting. 20 tablet 0  . saxagliptin  HCl (ONGLYZA) 2.5 MG TABS tablet Take 1 tablet (2.5 mg total) by mouth daily. 30 tablet 6  . sodium bicarbonate 650 MG tablet Take 650 mg by mouth 2 (two) times daily as needed for heartburn.     No current facility-administered medications for this visit.     Review of Systems Review of Systems  Constitutional: Negative.   Respiratory: Negative.   Cardiovascular: Negative.     Blood pressure 112/68, pulse 69, height '5\' 5"'$  (1.651 m), weight 212 lb (96.2 kg).  Physical Exam Physical Exam  Constitutional: She is oriented to person, place, and time. She appears well-developed and well-nourished.  Pulmonary/Chest:    Neurological: She is alert and oriented to person, place, and time.  Skin: Skin is warm and dry.    Data Reviewed In discussion of antiestrogen therapy, the patient's daughter brought up that she continues to make use of her Arimidex.  The best I can determine this continues from her original diagnosis in 2008. Radiation oncology notes reviewed.  Assessment    Doing well post repeat wide excision and accelerated partial breast radiation.    Plan      Record review suggested her last colonoscopy was in 2008.  If this is the case, she is overdue.  Review of primary care notes reports this has been suggested in the past.  Return in two months.   The patient is aware to call back for any questions or concerns.   HPI, Physical Exam, Assessment and Plan have been scribed under the direction and in the presence of Hervey Ard, MD.  Gaspar Cola, CMA  I have completed  the exam and reviewed the above documentation for accuracy and completeness.  I agree with the above.  Haematologist has been used and any errors in dictation or transcription are unintentional.  Hervey Ard, M.D., F.A.C.S.   Forest Gleason Byrnett 02/20/2018, 5:06 PM

## 2018-02-19 NOTE — Patient Instructions (Signed)
Patient to call back and let us know  if last colonoscopy was in 2008. Return in two months. The patient is aware to call back for any questions or concerns.

## 2018-02-20 ENCOUNTER — Encounter: Payer: Self-pay | Admitting: General Surgery

## 2018-02-20 DIAGNOSIS — E113212 Type 2 diabetes mellitus with mild nonproliferative diabetic retinopathy with macular edema, left eye: Secondary | ICD-10-CM | POA: Diagnosis not present

## 2018-02-20 DIAGNOSIS — E113211 Type 2 diabetes mellitus with mild nonproliferative diabetic retinopathy with macular edema, right eye: Secondary | ICD-10-CM | POA: Diagnosis not present

## 2018-02-23 ENCOUNTER — Other Ambulatory Visit: Payer: Self-pay | Admitting: Family Medicine

## 2018-02-25 NOTE — Telephone Encounter (Signed)
Trental Last filled:  11/25/17, #90 Last OV:  08/27/17 Next OV: none

## 2018-02-27 DIAGNOSIS — I129 Hypertensive chronic kidney disease with stage 1 through stage 4 chronic kidney disease, or unspecified chronic kidney disease: Secondary | ICD-10-CM | POA: Diagnosis not present

## 2018-02-27 DIAGNOSIS — E1129 Type 2 diabetes mellitus with other diabetic kidney complication: Secondary | ICD-10-CM | POA: Diagnosis not present

## 2018-02-27 DIAGNOSIS — N2581 Secondary hyperparathyroidism of renal origin: Secondary | ICD-10-CM | POA: Diagnosis not present

## 2018-02-27 DIAGNOSIS — N185 Chronic kidney disease, stage 5: Secondary | ICD-10-CM | POA: Diagnosis not present

## 2018-02-27 DIAGNOSIS — I120 Hypertensive chronic kidney disease with stage 5 chronic kidney disease or end stage renal disease: Secondary | ICD-10-CM | POA: Diagnosis not present

## 2018-03-06 ENCOUNTER — Other Ambulatory Visit: Payer: Self-pay | Admitting: Family Medicine

## 2018-03-07 NOTE — Telephone Encounter (Signed)
Arimidex Last filled:  12/11/17, #90 Last OV:  08/27/17, f/u Next OV:  none

## 2018-03-20 DIAGNOSIS — E113211 Type 2 diabetes mellitus with mild nonproliferative diabetic retinopathy with macular edema, right eye: Secondary | ICD-10-CM | POA: Diagnosis not present

## 2018-03-20 DIAGNOSIS — E113212 Type 2 diabetes mellitus with mild nonproliferative diabetic retinopathy with macular edema, left eye: Secondary | ICD-10-CM | POA: Diagnosis not present

## 2018-03-27 ENCOUNTER — Ambulatory Visit: Payer: Medicare Other | Admitting: Radiation Oncology

## 2018-04-05 DIAGNOSIS — H401133 Primary open-angle glaucoma, bilateral, severe stage: Secondary | ICD-10-CM | POA: Diagnosis not present

## 2018-04-13 ENCOUNTER — Other Ambulatory Visit: Payer: Self-pay | Admitting: Family Medicine

## 2018-04-17 ENCOUNTER — Ambulatory Visit
Admission: RE | Admit: 2018-04-17 | Discharge: 2018-04-17 | Disposition: A | Discharge status: Home / Self Care | Payer: Medicare Other | Source: Ambulatory Visit | Attending: Radiation Oncology | Admitting: Radiation Oncology

## 2018-04-17 ENCOUNTER — Encounter: Payer: Self-pay | Admitting: Radiation Oncology

## 2018-04-17 ENCOUNTER — Other Ambulatory Visit: Payer: Self-pay

## 2018-04-17 VITALS — BP 186/81 | HR 68 | Temp 96.5°F | Resp 16 | Wt 207.3 lb

## 2018-04-17 DIAGNOSIS — Z170 Estrogen receptor positive status [ER+]: Principal | ICD-10-CM

## 2018-04-17 DIAGNOSIS — C50412 Malignant neoplasm of upper-outer quadrant of left female breast: Secondary | ICD-10-CM

## 2018-04-17 NOTE — Progress Notes (Signed)
Radiation Oncology Follow up Note  Name: Alexis Tucker   Date:   04/17/2018 MRN:  [id number] DOB: [birth year]    This 77 y.o. female presents to the clinic today for one-month follow-up status post accelerated partial breast irradiation to her left breast for ER/PR positive invasive mammary carcinoma in patient previous he treated in 2008 with accelerated partial breast radiation to the same breast.  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 She has a history of accelerated partial breast radiation back in 2008 the left breast she is seen today in routine follow-up is doing well. She specifically denies breast tenderness cough or bone pain..she's currently on arimadex tolerating that well without side effect.  COMPLICATIONS OF TREATMENT: none  FOLLOW UP COMPLIANCE: keeps appointments   PHYSICAL EXAM:  BP (!) 186/81   Pulse 68   Temp (!) 96.5 F (35.8 C) (Tympanic)   Resp 16   Wt 207 lb 5.5 oz (94 kg)   BMI 34.50 kg/m  Left breast is somewhat firm consistent with prior surgery and accelerated partial breast radiation. No dominant mass or nodularity is noted in either breast in 2 positions examined. No axillary or supraclavicular adenopathy is noted.Well-developed well-nourished patient in NAD. HEENT reveals PERLA, EOMI, discs not visualized.  Oral cavity is clear. No oral mucosal lesions are identified. Neck is clear without evidence of cervical or supraclavicular adenopathy. Lungs are clear to A&P. Cardiac examination is essentially unremarkable with regular rate and rhythm without murmur rub or thrill. Abdomen is benign with no organomegaly or masses noted. Motor sensory and DTR levels are equal and symmetric in the upper and lower extremities. Cranial nerves II through XII are grossly intact. Proprioception is  intact. No peripheral adenopathy or edema is identified. No motor or sensory levels are noted. Crude visual fields are within normal range.  RADIOLOGY RESULTS: no current films for review  PLAN: at the present time she is doing well cosmetic result is still good to excellent. I am please were overall progress. I've asked to see her back in 4-5 months for follow-up. She continues on arimadex without side effect. Patient is to call with any concerns. I would like to take this opportunity to thank you for allowing me to participate in the care of your patient.Noreene Filbert, MD

## 2018-04-23 ENCOUNTER — Other Ambulatory Visit: Payer: Self-pay

## 2018-04-23 ENCOUNTER — Ambulatory Visit (INDEPENDENT_AMBULATORY_CARE_PROVIDER_SITE_OTHER): Payer: Medicare Other | Admitting: General Surgery

## 2018-04-23 ENCOUNTER — Encounter: Payer: Self-pay | Admitting: General Surgery

## 2018-04-23 VITALS — BP 173/97 | HR 73 | Temp 97.3°F | Ht 63.0 in | Wt 209.2 lb

## 2018-04-23 DIAGNOSIS — Z23 Encounter for immunization: Secondary | ICD-10-CM | POA: Diagnosis not present

## 2018-04-23 DIAGNOSIS — I6523 Occlusion and stenosis of bilateral carotid arteries: Secondary | ICD-10-CM

## 2018-04-23 DIAGNOSIS — C50412 Malignant neoplasm of upper-outer quadrant of left female breast: Secondary | ICD-10-CM | POA: Diagnosis not present

## 2018-04-23 DIAGNOSIS — Z170 Estrogen receptor positive status [ER+]: Secondary | ICD-10-CM

## 2018-04-23 NOTE — Progress Notes (Signed)
Patient ID: Alexis Tucker, female   DOB: Mar 06, 1941, 77 y.o.   MRN: [id number]  Chief Complaint  Patient presents with  . Follow-up     2 month follow up    HPI [name] is a 77 y.o. female here today to follow up for 2 month left breast mammosite check up, she is present in the office with her daughter Santiago Glad.   HPI  Past Medical History:  Diagnosis Date  . Anemia    per prior pcp  . Breast cancer ([place]) 2008   F/U radiation   . Breast cancer ([place]) 11/27/2017   mpT2 pN0 ER/PR positive, HER-2/neu negative.  2.5 cm maximum diameter, multifocal.  MammoSite  . Carotid stenosis, asymptomatic 01/2014   mild ICA, severe in ECA, rec rpt [place] 2 years  . Cataracts, bilateral   . CKD (chronic kidney disease) stage 4, GFR 15-29 ml/min (HCC) 01/2012   Cr 3.85, in 3s since 2010, discussing HD and L forearm graft (Singh/Schnier)  . Diabetes type 2, controlled ([place]) 2000s  . Diabetic retinopathy with macular edema ([place]) 03/2014   [place] at [facility] eye on avastin  . DJD (degenerative joint disease)    per prior pcp  . GERD (gastroesophageal reflux disease)   . Glaucoma 03/2014  . History of breast cancer 2008   infiltrating ductal carcinoma s/p mammosite  . History of colon cancer 2006   s/p colectomy  . History of hepatitis A 1990s   from food, resolved  . HTN (hypertension)   . Macular degeneration    wet R with vision loss, dry L; to see retina specialist  . Neuropathy    per prior pcp  . OA (osteoarthritis)    knees and hips  . Osteonecrosis (HCC)    hips?  . Personal history of radiation therapy 2008   F/U Left Breast Cancer  . PONV (postoperative nausea and vomiting)   . Post-surgical hypothyroidism   . Wears dentures    partial upper    Past Surgical History:  Procedure Laterality Date  . ABDOMINAL HYSTERECTOMY  1976   partial, after tubal pregnancy, h/o fibroids with heavy bleeding  . BREAST BIOPSY Left 2008   positive  . BREAST BIOPSY Left 11/22/2017   2oclock 2cmfn  wing shaped clip, INVASIVE MAMMARY CARCINOMA  . BREAST BIOPSY Left 11/22/2017   2oclock 4cmfn coil shaped clip, INVASIVE MAMMARY CARCINOMA  . BREAST BIOPSY Left 11/22/2017   lymph node - butterfly hydroMARK  . BREAST EXCISIONAL BIOPSY Left 01/07/2018   lumpectomy by Dr. Bary Castilla  . BREAST LUMPECTOMY Left 2008   F/U radiation  . BREAST LUMPECTOMY WITH SENTINEL LYMPH NODE BIOPSY Left 01/07/2018   Procedure: BREAST LUMPECTOMY WITH SENTINEL LYMPH NODE BX;  Surgeon: Robert Bellow, MD;  Location: [facility] ORS;  Service: General;  Laterality: Left;  . BREAST MAMMOSITE  2008  . carotid ultrasound  12/2012   1-39% bilateral stenosis, severe in ECA  . CATARACT EXTRACTION W/PHACO Left 08/14/2016   Procedure: CATARACT EXTRACTION PHACO AND INTRAOCULAR LENS PLACEMENT (IOC)  Left Diabetic;  Surgeon: Ronnell Freshwater, MD;  Location: [place];  Service: Ophthalmology;  Laterality: Left;  Diabetic - oral meds  . CHOLECYSTECTOMY  2005  . COLECTOMY  2005   colon cancer  . COLON SURGERY  2006   bowel obstruction  . COLONOSCOPY  2008   ext hem, ileo-colonic anastomosis in cecum, tubular adenoma x1, rec rpt 1 yr for multiple polyps (in [place])  . ESOPHAGOGASTRODUODENOSCOPY N/A 05/06/2016  Procedure: ESOPHAGOGASTRODUODENOSCOPY (EGD);  Surgeon: Manya Silvas, MD;  Location: [facility] ENDOSCOPY;  Service: Endoscopy;  Laterality: N/A;  . THYROIDECTOMY, PARTIAL     unclear why  . TOTAL KNEE ARTHROPLASTY Right 1999  . US ECHOCARDIOGRAPHY  11/2007   nl LV fxn EF 30%, diastolic dysfunction, LVH, tr TR and MR, slcerotic aortic valve    Family History  Problem Relation Age of Onset  . Diabetes Mother   . Hypertension Mother   . Arthritis Mother   . Alcohol abuse Father   . Arthritis Father   . Stroke Daughter   . CAD Neg Hx   . Cancer Neg Hx   . Breast cancer Neg Hx     Social History Social History   Tobacco Use  . Smoking status: Former Research scientist (life sciences)  . Smokeless tobacco: Never Used  . Tobacco  comment: quit over 40 yrs ago  Substance Use Topics  . Alcohol use: Yes    Comment: rare  . Drug use: Never    Allergies  Allergen Reactions  . Tramadol Other (See Comments)    nightmares  . Codeine Anxiety  . Sulfa Antibiotics Itching and Rash    Current Outpatient Medications  Medication Sig Dispense Refill  . acetaminophen (TYLENOL) 325 MG tablet Take 1 tablet (325 mg total) by mouth every 6 (six) hours as needed for mild pain (or Fever >/= 101).    . Acetylcysteine 600 MG CAPS Take by mouth daily.    Marland Kitchen allopurinol (ZYLOPRIM) 100 MG tablet TAKE 1 TABLET BY MOUTH DAILY 90 tablet 0  . anastrozole (ARIMIDEX) 1 MG tablet TAKE 1 TABLET BY MOUTH EVERY DAY 90 tablet 1  . aspirin EC 81 MG EC tablet Take 1 tablet (81 mg total) by mouth daily. 30 tablet 12  . Blood Glucose Monitoring Suppl (ACURA BLOOD GLUCOSE METER) W/DEVICE KIT Use as directed to check sugars daily 250.42 1 kit 0  . Calcium-Magnesium-Vitamin D (CALCIUM MAGNESIUM PO) Take 1 tablet by mouth daily.    . carvedilol (COREG) 25 MG tablet TAKE 1 TABLET BY MOUTH EVERY DAY 90 tablet 0  . cefadroxil (DURICEF) 500 MG capsule Take 1 capsule (500 mg total) by mouth 2 (two) times daily. Start one hour(2:00) before office procedure on 02-06-18 20 capsule 0  . Cholecalciferol (VITAMIN D-3) 5000 UNITS TABS Take 1 tablet by mouth daily.    . diclofenac sodium (VOLTAREN) 1 % GEL APPLY 1 APPLICATION TOPICALLY 3 (THREE) TIMES DAILY. 100 g 0  . ferrous sulfate (SLOW IRON) 160 (50 Fe) MG TBCR SR tablet Take 1 tablet (160 mg total) by mouth daily. 90 each 3  . Folic Acid-Vit Q7-MAU Q33 (FOLBEE) 2.5-25-1 MG TABS tablet Take 1 tablet by mouth daily.    . isosorbide dinitrate (ISORDIL) 20 MG tablet Take 1 tablet (20 mg total) by mouth daily. 90 tablet 1  . latanoprost (XALATAN) 0.005 % ophthalmic solution Place 1 drop into both eyes at bedtime.    Marland Kitchen levothyroxine (SYNTHROID, LEVOTHROID) 88 MCG tablet FIRST WEEK TAKE 1/2 TABLET DAILY THEN INCREASE TO 1  TABLET DAILY 90 tablet 0  . pantoprazole (PROTONIX) 40 MG tablet TAKE 1 TABLET (40 MG TOTAL) BY MOUTH DAILY AS NEEDED. 30 tablet 0  . pentoxifylline (TRENTAL) 400 MG CR tablet TAKE 1 TABLET BY MOUTH EVERY DAY 90 tablet 1  . promethazine (PHENERGAN) 12.5 MG tablet Take 1 tablet (12.5 mg total) by mouth every 6 (six) hours as needed for nausea or vomiting. 20 tablet 0  .  saxagliptin HCl (ONGLYZA) 2.5 MG TABS tablet Take 1 tablet (2.5 mg total) by mouth daily. 30 tablet 6  . sodium bicarbonate 650 MG tablet Take 650 mg by mouth 2 (two) times daily as needed for heartburn.     No current facility-administered medications for this visit.     Review of Systems Review of Systems  Constitutional: Negative.   Respiratory: Negative.   Cardiovascular: Negative.     Blood pressure (!) 173/97, pulse 73, temperature (!) 97.3 F (36.3 C), temperature source Temporal, height _0  (1.6 m), weight 209 lb 3.2 oz (94.9 kg), SpO2 96 %.  Physical Exam Physical Exam  Constitutional: She is oriented to person, place, and time. She appears well-developed and well-nourished.  Eyes: Conjunctivae are normal. No scleral icterus.  Pulmonary/Chest: Right breast exhibits no inverted nipple, no mass, no nipple discharge, no skin change and no tenderness. Left breast exhibits no inverted nipple, no mass, no nipple discharge, no skin change and no tenderness. Breasts are symmetrical.    Lymphadenopathy:    She has no cervical adenopathy.  Neurological: She is alert and oriented to person, place, and time.  Skin: Skin is warm and dry.    Data Reviewed Radiation oncology note of April 17, 2018 reviewed.   Assessment    Doing well post Mammosite radiation.     Plan  Continue Arimidex.  Call if refill required.  Patient is to return in May with bilateral mammogram. HPI, Physical Exam, Assessment and Plan have been scribed under the direction and in the presence of Hervey Ard, Md.  Eudelia Bunch R.  Bobette Mo, CMA  I have completed the exam and reviewed the above documentation for accuracy and completeness.  I agree with the above.  Haematologist has been used and any errors in dictation or transcription are unintentional.  Hervey Ard, M.D., F.A.C.S.   Forest Gleason Yona Kosek 04/23/2018, 1:08 PM

## 2018-04-23 NOTE — Patient Instructions (Signed)
Patient is to return in May with bilateral mammogram.

## 2018-04-24 DIAGNOSIS — E113212 Type 2 diabetes mellitus with mild nonproliferative diabetic retinopathy with macular edema, left eye: Secondary | ICD-10-CM | POA: Diagnosis not present

## 2018-04-24 DIAGNOSIS — E113211 Type 2 diabetes mellitus with mild nonproliferative diabetic retinopathy with macular edema, right eye: Secondary | ICD-10-CM | POA: Diagnosis not present

## 2018-05-08 ENCOUNTER — Telehealth: Payer: Self-pay | Admitting: Family Medicine

## 2018-05-08 NOTE — Telephone Encounter (Signed)
Sent refill.  Please schedule pt for annual wellness visit, overdue.

## 2018-05-09 ENCOUNTER — Telehealth: Payer: Self-pay | Admitting: Family Medicine

## 2018-05-09 NOTE — Telephone Encounter (Signed)
Left message asking pt to call office  °

## 2018-05-09 NOTE — Telephone Encounter (Signed)
Sent refill.  Please schedule pt for annual wellness visit, overdue.

## 2018-05-09 NOTE — Telephone Encounter (Signed)
Left message asking pt to call office  Sent refill.  Please schedule pt for annual wellness visit, overdue.  When scheduled let [name] know

## 2018-05-16 NOTE — Telephone Encounter (Signed)
Left message asking pt to call office  °

## 2018-05-20 ENCOUNTER — Telehealth: Payer: Self-pay | Admitting: Family Medicine

## 2018-05-20 NOTE — Telephone Encounter (Signed)
Left message asking pt to call office regarding derm referral

## 2018-05-20 NOTE — Telephone Encounter (Signed)
Left message asking pt to call office  °

## 2018-05-23 ENCOUNTER — Encounter: Payer: Self-pay | Admitting: Family Medicine

## 2018-05-23 NOTE — Telephone Encounter (Signed)
Noted  

## 2018-05-23 NOTE — Telephone Encounter (Signed)
Mailed letter asking pt to call office  

## 2018-06-06 DIAGNOSIS — E113211 Type 2 diabetes mellitus with mild nonproliferative diabetic retinopathy with macular edema, right eye: Secondary | ICD-10-CM | POA: Diagnosis not present

## 2018-06-06 DIAGNOSIS — E113212 Type 2 diabetes mellitus with mild nonproliferative diabetic retinopathy with macular edema, left eye: Secondary | ICD-10-CM | POA: Diagnosis not present

## 2018-06-06 LAB — HM DIABETES EYE EXAM

## 2018-06-14 ENCOUNTER — Other Ambulatory Visit: Payer: Self-pay | Admitting: Family Medicine

## 2018-06-17 ENCOUNTER — Other Ambulatory Visit: Payer: Self-pay | Admitting: Family Medicine

## 2018-06-17 ENCOUNTER — Encounter: Payer: Self-pay | Admitting: Family Medicine

## 2018-07-12 DIAGNOSIS — H524 Presbyopia: Secondary | ICD-10-CM | POA: Diagnosis not present

## 2018-07-12 DIAGNOSIS — H2511 Age-related nuclear cataract, right eye: Secondary | ICD-10-CM | POA: Diagnosis not present

## 2018-07-12 DIAGNOSIS — E113212 Type 2 diabetes mellitus with mild nonproliferative diabetic retinopathy with macular edema, left eye: Secondary | ICD-10-CM | POA: Diagnosis not present

## 2018-07-12 DIAGNOSIS — E113211 Type 2 diabetes mellitus with mild nonproliferative diabetic retinopathy with macular edema, right eye: Secondary | ICD-10-CM | POA: Diagnosis not present

## 2018-07-15 DIAGNOSIS — H401112 Primary open-angle glaucoma, right eye, moderate stage: Secondary | ICD-10-CM | POA: Diagnosis not present

## 2018-07-15 DIAGNOSIS — H401123 Primary open-angle glaucoma, left eye, severe stage: Secondary | ICD-10-CM | POA: Diagnosis not present

## 2018-07-15 DIAGNOSIS — E113291 Type 2 diabetes mellitus with mild nonproliferative diabetic retinopathy without macular edema, right eye: Secondary | ICD-10-CM | POA: Diagnosis not present

## 2018-07-15 DIAGNOSIS — E113212 Type 2 diabetes mellitus with mild nonproliferative diabetic retinopathy with macular edema, left eye: Secondary | ICD-10-CM | POA: Diagnosis not present

## 2018-07-15 DIAGNOSIS — H3582 Retinal ischemia: Secondary | ICD-10-CM | POA: Diagnosis not present

## 2018-07-15 DIAGNOSIS — H35352 Cystoid macular degeneration, left eye: Secondary | ICD-10-CM | POA: Diagnosis not present

## 2018-07-15 DIAGNOSIS — Z961 Presence of intraocular lens: Secondary | ICD-10-CM | POA: Diagnosis not present

## 2018-08-01 ENCOUNTER — Other Ambulatory Visit: Payer: Self-pay | Admitting: Family Medicine

## 2018-08-01 NOTE — Telephone Encounter (Signed)
Anastrozole Last filled:  05/22/18, #90/1 Last OV:  08/27/17, f/u Next OV:  none

## 2018-08-14 DIAGNOSIS — H25811 Combined forms of age-related cataract, right eye: Secondary | ICD-10-CM | POA: Diagnosis not present

## 2018-08-14 DIAGNOSIS — H2511 Age-related nuclear cataract, right eye: Secondary | ICD-10-CM | POA: Diagnosis not present

## 2018-08-27 DIAGNOSIS — E113212 Type 2 diabetes mellitus with mild nonproliferative diabetic retinopathy with macular edema, left eye: Secondary | ICD-10-CM | POA: Diagnosis not present

## 2018-09-11 ENCOUNTER — Telehealth: Payer: Self-pay | Admitting: Family Medicine

## 2018-09-11 NOTE — Telephone Encounter (Signed)
E-scribed isosorbide. Pls schedule annual wellness visit.

## 2018-09-13 NOTE — Telephone Encounter (Signed)
Left message asking pt to call office  °

## 2018-09-17 MED ORDER — LEVOTHYROXINE SODIUM 88 MCG PO TABS: ORAL_TABLET | ORAL | 1 refills | Status: DC

## 2018-09-17 MED ORDER — PANTOPRAZOLE SODIUM 40 MG PO TBEC: 40.0000 mg | DELAYED_RELEASE_TABLET | Freq: Every day | ORAL | 4 refills | Status: DC | PRN

## 2018-09-17 MED ORDER — ALLOPURINOL 100 MG PO TABS: 100.0000 mg | ORAL_TABLET | Freq: Every day | ORAL | 0 refills | Status: DC

## 2018-09-17 NOTE — Telephone Encounter (Signed)
Medicare wellness 6/24  cpx 7/6  Pt needs refill for allopurinol  [facility]  [place]

## 2018-09-17 NOTE — Telephone Encounter (Signed)
Noted.  E-scribed refill. 

## 2018-09-17 NOTE — Telephone Encounter (Signed)
Pt need refill for    levotyhroxine 88 mcg   Pantoprazole 40 mg   Sent to [facility]

## 2018-09-17 NOTE — Addendum Note (Signed)
Addended by: Brenton Grills on: 03/04/1114 52:08 PM   Modules accepted: Orders

## 2018-09-17 NOTE — Telephone Encounter (Signed)
E-scribed refills.  

## 2018-09-25 ENCOUNTER — Ambulatory Visit: Payer: Medicare Other | Admitting: Radiation Oncology

## 2018-10-26 ENCOUNTER — Other Ambulatory Visit: Payer: Self-pay | Admitting: Family Medicine

## 2018-10-28 ENCOUNTER — Other Ambulatory Visit: Payer: Self-pay

## 2018-10-28 ENCOUNTER — Encounter: Payer: Self-pay | Admitting: *Deleted

## 2018-10-28 DIAGNOSIS — Z170 Estrogen receptor positive status [ER+]: Principal | ICD-10-CM

## 2018-10-28 DIAGNOSIS — C50412 Malignant neoplasm of upper-outer quadrant of left female breast: Secondary | ICD-10-CM

## 2018-11-11 ENCOUNTER — Ambulatory Visit: Payer: Medicare Other | Admitting: Radiation Oncology

## 2018-11-20 ENCOUNTER — Other Ambulatory Visit: Payer: Self-pay | Admitting: Family Medicine

## 2018-11-21 NOTE — Telephone Encounter (Signed)
Last filled 08/2018, pt has appt with you in July

## 2018-11-22 NOTE — Telephone Encounter (Signed)
Eprescribed.

## 2018-12-03 ENCOUNTER — Other Ambulatory Visit: Payer: Self-pay | Admitting: Family Medicine

## 2018-12-09 ENCOUNTER — Other Ambulatory Visit: Payer: Self-pay | Admitting: Family Medicine

## 2018-12-24 ENCOUNTER — Other Ambulatory Visit: Payer: Self-pay | Admitting: Family Medicine

## 2018-12-24 DIAGNOSIS — E1129 Type 2 diabetes mellitus with other diabetic kidney complication: Secondary | ICD-10-CM

## 2018-12-24 DIAGNOSIS — N185 Chronic kidney disease, stage 5: Secondary | ICD-10-CM

## 2018-12-24 DIAGNOSIS — I10 Essential (primary) hypertension: Secondary | ICD-10-CM

## 2018-12-24 DIAGNOSIS — M810 Age-related osteoporosis without current pathological fracture: Secondary | ICD-10-CM

## 2018-12-24 DIAGNOSIS — IMO0002 Reserved for concepts with insufficient information to code with codable children: Secondary | ICD-10-CM

## 2018-12-24 DIAGNOSIS — D696 Thrombocytopenia, unspecified: Secondary | ICD-10-CM

## 2018-12-24 DIAGNOSIS — E890 Postprocedural hypothyroidism: Secondary | ICD-10-CM

## 2018-12-24 DIAGNOSIS — D631 Anemia in chronic kidney disease: Secondary | ICD-10-CM

## 2018-12-25 ENCOUNTER — Ambulatory Visit (INDEPENDENT_AMBULATORY_CARE_PROVIDER_SITE_OTHER): Payer: Medicare Other

## 2018-12-25 ENCOUNTER — Telehealth: Payer: Self-pay

## 2018-12-25 ENCOUNTER — Other Ambulatory Visit (INDEPENDENT_AMBULATORY_CARE_PROVIDER_SITE_OTHER): Payer: Medicare Other

## 2018-12-25 DIAGNOSIS — E890 Postprocedural hypothyroidism: Secondary | ICD-10-CM

## 2018-12-25 DIAGNOSIS — I10 Essential (primary) hypertension: Secondary | ICD-10-CM

## 2018-12-25 DIAGNOSIS — M810 Age-related osteoporosis without current pathological fracture: Secondary | ICD-10-CM

## 2018-12-25 DIAGNOSIS — D631 Anemia in chronic kidney disease: Secondary | ICD-10-CM

## 2018-12-25 DIAGNOSIS — E1165 Type 2 diabetes mellitus with hyperglycemia: Secondary | ICD-10-CM | POA: Diagnosis not present

## 2018-12-25 DIAGNOSIS — Z0000 Encounter for general adult medical examination without abnormal findings: Secondary | ICD-10-CM

## 2018-12-25 DIAGNOSIS — E1129 Type 2 diabetes mellitus with other diabetic kidney complication: Secondary | ICD-10-CM

## 2018-12-25 DIAGNOSIS — IMO0002 Reserved for concepts with insufficient information to code with codable children: Secondary | ICD-10-CM

## 2018-12-25 DIAGNOSIS — N185 Chronic kidney disease, stage 5: Secondary | ICD-10-CM

## 2018-12-25 DIAGNOSIS — D696 Thrombocytopenia, unspecified: Secondary | ICD-10-CM

## 2018-12-25 LAB — COMPREHENSIVE METABOLIC PANEL WITH GFR
ALT: 14 U/L (ref 0–35)
AST: 17 U/L (ref 0–37)
Albumin: 3.9 g/dL (ref 3.5–5.2)
Alkaline Phosphatase: 70 U/L (ref 39–117)
BUN: 30 mg/dL — ABNORMAL HIGH (ref 6–23)
CO2: 22 meq/L (ref 19–32)
Calcium: 9 mg/dL (ref 8.4–10.5)
Chloride: 113 meq/L — ABNORMAL HIGH (ref 96–112)
Creatinine, Ser: 4.11 mg/dL — ABNORMAL HIGH (ref 0.40–1.20)
GFR: 12.77 mL/min — CL (ref >60.00)
Glucose, Bld: 121 mg/dL — ABNORMAL HIGH (ref 70–99)
Potassium: 4.9 meq/L (ref 3.5–5.1)
Sodium: 142 meq/L (ref 135–145)
Total Bilirubin: 0.5 mg/dL (ref 0.2–1.2)
Total Protein: 6.4 g/dL (ref 6.0–8.3)

## 2018-12-25 LAB — CBC WITH DIFFERENTIAL/PLATELET
Basophils Absolute: 0.1 K/uL (ref 0.0–0.1)
Basophils Relative: 1.2 % (ref 0.0–3.0)
Eosinophils Absolute: 0.1 K/uL (ref 0.0–0.7)
Eosinophils Relative: 3.2 % (ref 0.0–5.0)
HCT: 31.5 % — ABNORMAL LOW (ref 36.0–46.0)
Hemoglobin: 10.3 g/dL — ABNORMAL LOW (ref 12.0–15.0)
Lymphocytes Relative: 29.7 % (ref 12.0–46.0)
Lymphs Abs: 1.3 K/uL (ref 0.7–4.0)
MCHC: 32.7 g/dL (ref 30.0–36.0)
MCV: 94.8 fl (ref 78.0–100.0)
Monocytes Absolute: 0.6 K/uL (ref 0.1–1.0)
Monocytes Relative: 13 % — ABNORMAL HIGH (ref 3.0–12.0)
Neutro Abs: 2.3 K/uL (ref 1.4–7.7)
Neutrophils Relative %: 52.9 % (ref 43.0–77.0)
Platelets: 124 K/uL — ABNORMAL LOW (ref 150.0–400.0)
RBC: 3.32 Mil/uL — ABNORMAL LOW (ref 3.87–5.11)
RDW: 14.7 % (ref 11.5–15.5)
WBC: 4.3 K/uL (ref 4.0–10.5)

## 2018-12-25 LAB — LIPID PANEL
Cholesterol: 158 mg/dL (ref 0–200)
HDL: 51.7 mg/dL (ref >39.00)
LDL Cholesterol: 90 mg/dL (ref 0–99)
NonHDL: 106.78
Total CHOL/HDL Ratio: 3
Triglycerides: 83 mg/dL (ref 0.0–149.0)
VLDL: 16.6 mg/dL (ref 0.0–40.0)

## 2018-12-25 LAB — T4, FREE: Free T4: 0.95 ng/dL (ref 0.60–1.60)

## 2018-12-25 LAB — VITAMIN D 25 HYDROXY (VIT D DEFICIENCY, FRACTURES): VITD: 70.23 ng/mL (ref 30.00–100.00)

## 2018-12-25 LAB — TSH: TSH: 1.98 u[IU]/mL (ref 0.35–4.50)

## 2018-12-25 NOTE — Progress Notes (Signed)
PCP notes:   Health maintenance:  Foot exam - to be determined Colonoscopy - to be determined A1C - completed  Abnormal screenings:   None  Patient concerns:   None  Nurse concerns:  None  Next PCP appt:   01/06/19 @ 1130

## 2018-12-25 NOTE — Patient Instructions (Signed)
Alexis Tucker , Thank you for taking time to come for your Medicare Wellness Visit. I appreciate your ongoing commitment to your health goals. Please review the following plan we discussed and let me know if I can assist you in the future.   These are the goals we discussed: Goals    . safety     Starting 12/25/18, I will continue to use cane as needed to reduce risk of falls.        This is a list of the screening recommended for you and due dates:  Health Maintenance  Topic Date Due  . Mammogram  09/26/2018  . Complete foot exam   07/02/2020*  . Colon Cancer Screening  07/02/2020*  . DTaP/Tdap/Td vaccine (1 - Tdap) 11/03/2026*  . Tetanus Vaccine  11/03/2026*  . Flu Shot  02/01/2019  . Eye exam for diabetics  06/07/2019  . Hemoglobin A1C  06/26/2019  . DEXA scan (bone density measurement)  Completed  . Pneumonia vaccines  Completed  *Topic was postponed. The date shown is not the original due date.   Preventive Care for Adults  A healthy lifestyle and preventive care can promote health and wellness. Preventive health guidelines for adults include the following key practices.  . A routine yearly physical is a good way to check with your health care provider about your health and preventive screening. It is a chance to share any concerns and updates on your health and to receive a thorough exam.  . Visit your dentist for a routine exam and preventive care every 6 months. Brush your teeth twice a day and floss once a day. Good oral hygiene prevents tooth decay and gum disease.  . The frequency of eye exams is based on your age, health, family medical history, use  of contact lenses, and other factors. Follow your health care provider's recommendations for frequency of eye exams.  . Eat a healthy diet. Foods like vegetables, fruits, whole grains, low-fat dairy products, and lean protein foods contain the nutrients you need without too many calories. Decrease your intake of foods high in  solid fats, added sugars, and salt. Eat the right amount of calories for you. Get information about a proper diet from your health care provider, if necessary.  . Regular physical exercise is one of the most important things you can do for your health. Most adults should get at least 150 minutes of moderate-intensity exercise (any activity that increases your heart rate and causes you to sweat) each week. In addition, most adults need muscle-strengthening exercises on 2 or more days a week.  Silver Sneakers may be a benefit available to you. To determine eligibility, you may visit the website: www.silversneakers.com or contact program at [phone] Mon-Fri between 8AM-8PM.   . Maintain a healthy weight. The body mass index (BMI) is a screening tool to identify possible weight problems. It provides an estimate of body fat based on height and weight. Your health care provider can find your BMI and can help you achieve or maintain a healthy weight.   For adults 20 years and older: ? A BMI below 18.5 is considered underweight. ? A BMI of 18.5 to 24.9 is normal. ? A BMI of 25 to 29.9 is considered overweight. ? A BMI of 30 and above is considered obese.   . Maintain normal blood lipids and cholesterol levels by exercising and minimizing your intake of saturated fat. Eat a balanced diet with plenty of fruit and vegetables. Blood tests for  lipids and cholesterol should begin at age 84 and be repeated every 5 years. If your lipid or cholesterol levels are high, you are over 50, or you are at high risk for heart disease, you may need your cholesterol levels checked more frequently. Ongoing high lipid and cholesterol levels should be treated with medicines if diet and exercise are not working.  . If you smoke, find out from your health care provider how to quit. If you do not use tobacco, please do not start.  . If you choose to drink alcohol, please do not consume more than 2 drinks per day. One drink  is considered to be 12 ounces (355 mL) of beer, 5 ounces (148 mL) of wine, or 1.5 ounces (44 mL) of liquor.  . If you are 40-94 years old, ask your health care provider if you should take aspirin to prevent strokes.  . Use sunscreen. Apply sunscreen liberally and repeatedly throughout the day. You should seek shade when your shadow is shorter than you. Protect yourself by wearing long sleeves, pants, a wide-brimmed hat, and sunglasses year round, whenever you are outdoors.  . Once a month, do a whole body skin exam, using a mirror to look at the skin on your back. Tell your health care provider of new moles, moles that have irregular borders, moles that are larger than a pencil eraser, or moles that have changed in shape or color.

## 2018-12-25 NOTE — Telephone Encounter (Signed)
[facility] called a critical result  @ 1600  GFR 12.77

## 2018-12-25 NOTE — Progress Notes (Signed)
Subjective:   Alexis Tucker is a 78 y.o. female who presents for Medicare Annual (Subsequent) preventive examination.  Review of Systems:  N/A Cardiac Risk Factors include: advanced age (>96mn, >>83women);diabetes mellitus;hypertension;obesity (BMI >30kg/m2)     Objective:     Vitals: There were no vitals taken for this visit.  There is no height or weight on file to calculate BMI.  Advanced Directives 12/25/2018 04/17/2018 01/28/2018 01/07/2018 12/28/2017 12/03/2017 08/17/2017  Does Patient Have a Medical Advance Directive? Yes _0  No  Type of [place]amed[place]of [place] will - - - - - -  Does patient want to make changes to medical advance directive? No - Patient declined - - - - - -  Copy of HBrunsonin Chart? No - copy requested - - - - - -  Would patient like information on creating a medical advance directive? - No - Patient declined No - Patient declined - No - Patient declined Yes (ED - Information included in AVS) No - Patient declined    Tobacco Social History   Tobacco Use  Smoking Status Former Smoker  Smokeless Tobacco Never Used  Tobacco Comment   quit over 40 yrs ago     Counseling given: No Comment: quit over 40 yrs ago   Clinical Intake:  Pre-visit preparation completed: Yes  Pain : No/denies pain Pain Score: 0-No pain     Nutritional Status: BMI > 30  Obese Nutritional Risks: None Diabetes: Yes CBG done?: No Did pt. bring in CBG monitor from home?: No  How often do you need to have someone help you when you read instructions, pamphlets, or other written materials from your doctor or pharmacy?: 1 - Never What is the last grade level you completed in school?: 12th grade + 2 yrs college  Interpreter Needed?: No  Comments: pt lives independently Information entered by :: LPinson, RN  Past Medical History:  Diagnosis Date  . Anemia    per prior pcp  . [place]   F/U  radiation   . Breast cancer ([place] 11/27/2017   mpT2 pN0 ER/PR positive, HER-2/neu negative.  2.5 cm maximum diameter, multifocal.  MammoSite  . Carotid stenosis, asymptomatic 01/2014   mild ICA, severe in ECA, [place]pt [place]  . Cataracts, bilateral   . CKD (chronic kidney disease) stage 4, GFR 15-29 ml/min (HCC) 01/2012   Cr 3.85, in 3s since 2010, discussing HD and L forearm graft ([facility])  . Diabetes type 2, controlled ([place] 2000s  . Diabetic retinopathy with macular edema [facility] 03[facility]  . DJD (degenerative joint disease)    per prior pcp  . GERD (gastroesophageal reflux disease)   . Glaucoma 03/2014  . History of breast cancer 2008   infiltrating ductal carcinoma s/p mammosite  . History of colon cancer 2006   s/p colectomy  . History of hepatitis A 1990s   from food, resolved  . HTN (hypertension)   . Macular degeneration    wet R with vision loss, dry L; to see retina specialist  . Neuropathy    per prior pcp  . OA (osteoarthritis)    knees and hips  . Osteonecrosis (HCC)    hips?  . Personal history of radiation therapy 2008   F/U Left Breast Cancer  . PONV (postoperative nausea and vomiting)   . Post-surgical hypothyroidism   . Wears dentures  partial upper   Past Surgical History:  Procedure Laterality Date  . ABDOMINAL HYSTERECTOMY  1976   partial, after tubal pregnancy, h/o fibroids with heavy bleeding  . BREAST BIOPSY Left 2008   positive  . BREAST BIOPSY Left 11/22/2017   2oclock 2cmfn wing shaped clip, INVASIVE MAMMARY CARCINOMA  . BREAST BIOPSY Left 11/22/2017   2oclock 4cmfn coil shaped clip, INVASIVE MAMMARY CARCINOMA  . BREAST BIOPSY Left 11/22/2017   lymph node - butterfly hydroMARK  . BREAST EXCISIONAL BIOPSY Left 01/07/2018   lumpectomy by Dr. Bary Castilla  . BREAST LUMPECTOMY Left 2008   F/U radiation  . BREAST LUMPECTOMY WITH SENTINEL LYMPH NODE BIOPSY Left 01/07/2018   Procedure: BREAST LUMPECTOMY  WITH SENTINEL LYMPH NODE BX;  Surgeon: Robert Bellow, MD;  Location: [facility] ORS;  Service: General;  Laterality: Left;  . BREAST MAMMOSITE  2008  . carotid ultrasound  12/2012   1-39% bilateral stenosis, severe in ECA  . CATARACT EXTRACTION W/PHACO Left 08/14/2016   Procedure: CATARACT EXTRACTION PHACO AND INTRAOCULAR LENS PLACEMENT (IOC)  Left Diabetic;  Surgeon: Ronnell Freshwater, MD;  Location: [place];  Service: Ophthalmology;  Laterality: Left;  Diabetic - oral meds  . CHOLECYSTECTOMY  2005  . COLECTOMY  2005   colon cancer  . COLON SURGERY  2006   bowel obstruction  . COLONOSCOPY  2008   ext hem, ileo-colonic anastomosis in cecum, tubular adenoma x1, rec rpt 1 yr for multiple polyps (in [place])  . ESOPHAGOGASTRODUODENOSCOPY N/A 05/06/2016   Procedure: ESOPHAGOGASTRODUODENOSCOPY (EGD);  Surgeon: Manya Silvas, MD;  Location[facility] ENDOSCOPY;  Service: Endoscopy;  Laterality: N/A;  . THYROIDECTOMY, PARTIAL     unclear why  . TOTAL KNEE ARTHROPLASTY Right 1999  . US ECHOCARDIOGRAPHY  11/2007   nl LV fxn EF 26%, diastolic dysfunction, LVH, tr TR and MR, slcerotic aortic valve   Family History  Problem Relation Age of Onset  . Diabetes Mother   . Hypertension Mother   . Arthritis Mother   . Alcohol abuse Father   . Arthritis Father   . Stroke Daughter   . CAD Neg Hx   . Cancer Neg Hx   . Breast cancer Neg Hx    Social History   Socioeconomic History  . Marital status: Divorced    Spouse name: Not on file  . Number of children: Not on file  . Years of education: Not on file  . Highest education level: Not on file  Occupational History  . Not on file  Social Needs  . Financial resource strain: Not on file  . Food insecurity    Worry: Not on file    Inability: Not on file  . Transportation needs    Medical: Not on file    Non-medical: Not on file  Tobacco Use  . Smoking status: Former Research scientist (life sciences)  . Smokeless tobacco: Never Used  . Tobacco comment: quit  over 40 yrs ago  Substance and Sexual Activity  . Alcohol use: Yes    Comment: rare  . Drug use: Never  . Sexual activity: Not Currently  Lifestyle  . Physical activity    Days per week: Not on file    Minutes per session: Not on file  . Stress: Not on file  Relationships  . Social Herbalist on phone: Not on file    Gets together: Not on file    Attends religious service: Not on file    Active member of  club or organization: Not on file    Attends meetings of clubs or organizations: Not on file    Relationship status: Not on file  Other Topics Concern  . Not on file  Social History Narrative   Lives alone, 1 dog.   Granddaughter in [place] (attorney).   Divorced   Occu: retired, worked in Engineer, technical sales   Edu: college    Outpatient Encounter Medications as of 12/25/2018  Medication Sig  . acetaminophen (TYLENOL) 325 MG tablet Take 1 tablet (325 mg total) by mouth every 6 (six) hours as needed for mild pain (or Fever >/= 101).  . Acetylcysteine 600 MG CAPS Take by mouth daily.  Marland Kitchen allopurinol (ZYLOPRIM) 100 MG tablet TAKE 1 TABLET BY MOUTH EVERY DAY  . anastrozole (ARIMIDEX) 1 MG tablet TAKE 1 TABLET BY MOUTH EVERY DAY  . aspirin EC 81 MG EC tablet Take 1 tablet (81 mg total) by mouth daily.  . Blood Glucose Monitoring Suppl (ACURA BLOOD GLUCOSE METER) W/DEVICE KIT Use as directed to check sugars daily 250.42  . CALCIUM-MAGNESIUM PO Take 1 tablet by mouth daily.  . carvedilol (COREG) 25 MG tablet TAKE 1 TABLET BY MOUTH EVERY DAY  . cefadroxil (DURICEF) 500 MG capsule Take 1 capsule (500 mg total) by mouth 2 (two) times daily. Start one hour(2:00) before office procedure on 02-06-18  . Cholecalciferol (VITAMIN D-3) 5000 UNITS TABS Take 1 tablet by mouth daily.  . diclofenac sodium (VOLTAREN) 1 % GEL APPLY 1 APPLICATION TOPICALLY 3 (THREE) TIMES DAILY.  . ferrous sulfate (SLOW IRON) 160 (50 Fe) MG TBCR SR tablet Take 1 tablet (160 mg total) by mouth daily.  . Folic Acid-Vit V3-XTG G26  (FOLBEE) 2.5-25-1 MG TABS tablet Take 1 tablet by mouth daily.  . isosorbide dinitrate (ISORDIL) 20 MG tablet TAKE 1 TABLET BY MOUTH EVERY DAY  . latanoprost (XALATAN) 0.005 % ophthalmic solution Place 1 drop into both eyes at bedtime.  Marland Kitchen levothyroxine (SYNTHROID, LEVOTHROID) 88 MCG tablet TAKE 1 TABLET BY MOUTH DAILY  . ONGLYZA 2.5 MG TABS tablet TAKE 1 TABLET BY MOUTH EVERY DAY  . pantoprazole (PROTONIX) 40 MG tablet Take 1 tablet (40 mg total) by mouth daily as needed.  . pentoxifylline (TRENTAL) 400 MG CR tablet TAKE 1 TABLET BY MOUTH EVERY DAY  . promethazine (PHENERGAN) 12.5 MG tablet Take 1 tablet (12.5 mg total) by mouth every 6 (six) hours as needed for nausea or vomiting.  . sodium bicarbonate 650 MG tablet Take 650 mg by mouth 2 (two) times daily as needed for heartburn.  . [DISCONTINUED] Calcium-Magnesium-Vitamin D (CALCIUM MAGNESIUM PO) Take 1 tablet by mouth daily.   No facility-administered encounter medications on file as of 12/25/2018.     Activities of Daily Living In your present state of health, do you have any difficulty performing the following activities: 12/25/2018 12/28/2017  Hearing? N N  Vision? Y Y  Comment macular edema in left eye -  Difficulty concentrating or making decisions? N N  Walking or climbing stairs? N Y  Dressing or bathing? N N  Doing errands, shopping? Y Y  Comment pt does not drive Facilities manager and eating ? N -  Using the Toilet? N -  In the past six months, have you accidently leaked urine? N -  Do you have problems with loss of bowel control? N -  Managing your Medications? N -  Managing your Finances? N -  Housekeeping or managing your Housekeeping? N -  Some recent  data might be hidden    Patient Care Team: Ria Bush, MD as PCP - General (Family Medicine)    Assessment:   This is a routine wellness examination for Reka.   Hearing Screening   _0  _1  _2  _3  _4  _5  _6  _7  _8   Right ear:            Left ear:           Vision Screening Comments: Vision exam in 2020 with Dr. Posey Pronto   Exercise Activities and Dietary recommendations Current Exercise Habits: The patient does not participate in regular exercise at present, Exercise limited by: None identified  Goals    . safety     Starting 12/25/18, I will continue to use cane as needed to reduce risk of falls.        Fall Risk Fall Risk  12/25/2018 01/28/2018 11/02/2016 07/27/2015 03/13/2014  Falls in the past year? 0 No No No No  Risk for fall due to : - - - - -   Depression Screen PHQ 2/9 Scores 12/25/2018 01/28/2018 11/02/2016 07/27/2015  PHQ - 2 Score 0 0 0 0  PHQ- 9 Score 0 - - -     Cognitive Function MMSE - Mini Mental State Exam 12/25/2018 11/02/2016  Orientation to time 5 5  Orientation to Place 5 5  Registration 3 3  Attention/ Calculation 0 0  Recall 3 3  Language- name 2 objects 0 0  Language- repeat 1 1  Language- follow 3 step command 0 3  Language- read & follow direction 0 0  Write a sentence 0 0  Copy design 0 0  Total score 17 20     PLEASE NOTE: A Mini-Cog screen was completed. Maximum score is 17. A value of 0 denotes this part of Folstein MMSE was not completed or the patient failed this part of the Mini-Cog screening.   Mini-Cog Screening Orientation to Time - Max 5 pts Orientation to Place - Max 5 pts Registration - Max 3 pts Recall - Max 3 pts Language Repeat - Max 1 pts      Immunization History  Administered Date(s) Administered  . Influenza, High Dose Seasonal PF 05/26/2017, 04/23/2018  . Influenza,inj,Quad PF,6+ Mos 03/13/2014, 07/27/2015  . Influenza-Unspecified 05/26/2017  . Pneumococcal Conjugate-13 03/13/2014  . Pneumococcal Polysaccharide-23 07/03/2009    Screening Tests Health Maintenance  Topic Date Due  . MAMMOGRAM  09/26/2018  . FOOT EXAM  07/02/2020 (Originally 03/16/1951)  . COLONOSCOPY  07/02/2020 (Originally 01/27/2017)  . DTaP/Tdap/Td (1 - Tdap) 11/03/2026  (Originally 03/16/1960)  . TETANUS/TDAP  11/03/2026 (Originally 03/16/1960)  . INFLUENZA VACCINE  02/01/2019  . OPHTHALMOLOGY EXAM  06/07/2019  . HEMOGLOBIN A1C  06/26/2019  . DEXA SCAN  Completed  . PNA vac Low Risk Adult  Completed    Plan:     I have personally reviewed, addressed, and noted the following in the patient's chart:  A. Medical and social history B. Use of alcohol, tobacco or illicit drugs  C. Current medications and supplements D. Functional ability and status E.  Nutritional status F.  Physical activity G. Advance directives H. List of other physicians I.  Hospitalizations, surgeries, and ER visits in previous 12 months J.  Vitals (unless it is a telemedicine encounter) K. Screenings to include cognitive, depression, hearing, vision (NOTE: hearing and vision screenings not completed in telemedicine encounter) L. Referrals and appointments   In addition, I have reviewed and discussed with patient certain preventive protocols, quality metrics,  and best practice recommendations. A written personalized care plan for preventive services and recommendations were provided to patient.  With patient's permission, we connected on 12/25/18 at 12:30 PM EDT. Interactive audio and video telecommunications were attempted with patient. This attempt was unsuccessful due to patient having technical difficulties OR patient did not have access to video capability.  Encounter was completed with audio only.  Two patient identifiers were used to ensure the encounter occurred with the correct person. Patient was in home and writer was in office.     Signed,   Lindell Noe, MHA, BS, RN Health Coach

## 2018-12-25 NOTE — Addendum Note (Signed)
Addended by: Ellamae Sia on: 12/25/2018 03:32 PM   Modules accepted: Orders

## 2018-12-25 NOTE — Telephone Encounter (Signed)
Noted. Known ESRD.  °

## 2018-12-26 LAB — MICROALBUMIN / CREATININE URINE RATIO
Creatinine,U: 114.4 mg/dL
Microalb Creat Ratio: 28.8 mg/g (ref 0.0–30.0)
Microalb, Ur: 32.9 mg/dL — ABNORMAL HIGH (ref 0.0–1.9)

## 2018-12-26 LAB — HEMOGLOBIN A1C: Hgb A1c MFr Bld: 6.5 % (ref 4.6–6.5)

## 2018-12-30 NOTE — Progress Notes (Signed)
I reviewed health advisor's note, was available for consultation, and agree with documentation and plan.  

## 2019-01-01 ENCOUNTER — Ambulatory Visit
Admission: RE | Admit: 2019-01-01 | Discharge: 2019-01-01 | Disposition: A | Discharge status: Home / Self Care | Payer: Medicare Other | Source: Ambulatory Visit | Attending: General Surgery | Admitting: General Surgery

## 2019-01-01 ENCOUNTER — Other Ambulatory Visit: Payer: Self-pay

## 2019-01-01 ENCOUNTER — Ambulatory Visit: Payer: Medicare Other

## 2019-01-01 DIAGNOSIS — Z853 Personal history of malignant neoplasm of breast: Secondary | ICD-10-CM | POA: Diagnosis not present

## 2019-01-01 DIAGNOSIS — Z170 Estrogen receptor positive status [ER+]: Secondary | ICD-10-CM | POA: Insufficient documentation

## 2019-01-01 DIAGNOSIS — R928 Other abnormal and inconclusive findings on diagnostic imaging of breast: Secondary | ICD-10-CM | POA: Diagnosis not present

## 2019-01-01 DIAGNOSIS — C50412 Malignant neoplasm of upper-outer quadrant of left female breast: Secondary | ICD-10-CM

## 2019-01-06 ENCOUNTER — Other Ambulatory Visit: Payer: Self-pay | Admitting: Family Medicine

## 2019-01-06 ENCOUNTER — Encounter: Payer: Self-pay | Admitting: Family Medicine

## 2019-01-06 ENCOUNTER — Ambulatory Visit (INDEPENDENT_AMBULATORY_CARE_PROVIDER_SITE_OTHER): Payer: Medicare Other | Admitting: Family Medicine

## 2019-01-06 VITALS — BP 172/107 | Temp 94.6°F | Ht 63.0 in

## 2019-01-06 DIAGNOSIS — Z853 Personal history of malignant neoplasm of breast: Secondary | ICD-10-CM | POA: Diagnosis not present

## 2019-01-06 DIAGNOSIS — Z85038 Personal history of other malignant neoplasm of large intestine: Secondary | ICD-10-CM

## 2019-01-06 DIAGNOSIS — E11311 Type 2 diabetes mellitus with unspecified diabetic retinopathy with macular edema: Secondary | ICD-10-CM

## 2019-01-06 DIAGNOSIS — E1165 Type 2 diabetes mellitus with hyperglycemia: Secondary | ICD-10-CM

## 2019-01-06 DIAGNOSIS — D631 Anemia in chronic kidney disease: Secondary | ICD-10-CM | POA: Diagnosis not present

## 2019-01-06 DIAGNOSIS — M810 Age-related osteoporosis without current pathological fracture: Secondary | ICD-10-CM

## 2019-01-06 DIAGNOSIS — N185 Chronic kidney disease, stage 5: Secondary | ICD-10-CM | POA: Diagnosis not present

## 2019-01-06 DIAGNOSIS — I10 Essential (primary) hypertension: Secondary | ICD-10-CM | POA: Diagnosis not present

## 2019-01-06 DIAGNOSIS — E890 Postprocedural hypothyroidism: Secondary | ICD-10-CM

## 2019-01-06 DIAGNOSIS — E1129 Type 2 diabetes mellitus with other diabetic kidney complication: Secondary | ICD-10-CM

## 2019-01-06 DIAGNOSIS — IMO0002 Reserved for concepts with insufficient information to code with codable children: Secondary | ICD-10-CM

## 2019-01-06 DIAGNOSIS — H3530 Unspecified macular degeneration: Secondary | ICD-10-CM

## 2019-01-06 MED ORDER — ONGLYZA 2.5 MG PO TABS: 2.5000 mg | ORAL_TABLET | Freq: Every day | ORAL | 6 refills | Status: DC

## 2019-01-06 NOTE — Assessment & Plan Note (Addendum)
Chronic, deteriorated. She will start checking blood pressures at home and call me in a few days - if persistently elevated, low threshold to add 3rd antihypertensive.

## 2019-01-06 NOTE — Progress Notes (Signed)
Jacqlyn Marolf - 78 y.o. female  MRN [id number]  Date of Birth: [birth year]  PCP: Ria Bush, MD  This service was provided via telemedicine. Phone Visit performed on 01/06/2019    Rationale for phone visit along with limitations reviewed. Patient consented to telephone encounter.    Location of patient: at home Location of provider: in office, [place] @ [facility] Name of referring provider: N/A   Names of persons and role in encounter: Provider: Ria Bush, MD  Patient: Alexis Tucker  Other: N/A   Time on call: 11:52am - 12:21pm   Subjective: Chief Complaint  Patient presents with  . Annual Exam    Pt 2.      HPI:  I last saw patient 08/2017. Did not return for f/u.  Saw Lesia last week for medicare wellness visit. Note reviewed.    Complicated year including treatment for breast cancer followed by Dr Bary Castilla and Dr Baruch Gouty - had mammogram 01/01/2019 that was reassuring.   Known stage V CKD - overdue for f/u Dr Candiss Norse. Has not returned since 10/2017. She will call for appointment.   Main concern is ongoing vision loss - no improvement noted. Asks about lutein and zinc  Preventative: Well woman was 2008 (OBGYN at [facility]). s/p hysterectomy 1976.  Breast cancer - undergoing treatment COLONOSCOPY Date: 2008 ext hem, ileo-colonic anastomosis in cecum, tubular adenoma x1, rec rpt 1 yr for multiple polyps (done in [place]). H/o colon cancer 2005 s/p colectomy. She never returned for this. Never returned iFOB last year. She will consider discussing options with Dr Bary Castilla   Objective/Observations:  No physical exam or vital signs collected unless specifically identified below.   BP (!) 172/107   Temp (!) 94.6 F (34.8 C) (Oral)   Ht 5\' 3"  (1.6 m)   BMI 37.06 kg/m   On repeat, BP 180/105  Respiratory status: speaks in complete sentences without evident shortness of breath.   Results for orders placed or performed in visit on 12/25/18  Lipid panel   Result Value Ref Range   Cholesterol 158 0 - 200 mg/dL   Triglycerides 83.0 0.0 - 149.0 mg/dL   HDL 51.70 >39.00 mg/dL   VLDL 16.6 0.0 - 40.0 mg/dL   LDL Cholesterol 90 0 - 99 mg/dL   Total CHOL/HDL Ratio 3    NonHDL 106.78   Comprehensive metabolic panel  Result Value Ref Range   Sodium 142 135 - 145 mEq/L   Potassium 4.9 3.5 - 5.1 mEq/L   Chloride 113 (H) 96 - 112 mEq/L   CO2 22 19 - 32 mEq/L   Glucose, Bld 121 (H) 70 - 99 mg/dL   BUN 30 (H) 6 - 23 mg/dL   Creatinine, Ser 4.11 (H) 0.40 - 1.20 mg/dL   Total Bilirubin 0.5 0.2 - 1.2 mg/dL   Alkaline Phosphatase 70 39 - 117 U/L   AST 17 0 - 37 U/L   ALT 14 0 - 35 U/L   Total Protein 6.4 6.0 - 8.3 g/dL   Albumin 3.9 3.5 - 5.2 g/dL   Calcium 9.0 8.4 - 10.5 mg/dL   GFR 12.77 (LL) >60.00 mL/min  TSH  Result Value Ref Range   TSH 1.98 0.35 - 4.50 uIU/mL  Hemoglobin A1c  Result Value Ref Range   Hgb A1c MFr Bld 6.5 4.6 - 6.5 %  CBC with Differential/Platelet  Result Value Ref Range   WBC 4.3 4.0 - 10.5 K/uL   RBC 3.32 (L) 3.87 - 5.11 Mil/uL  Hemoglobin 10.3 (L) 12.0 - 15.0 g/dL   HCT 31.5 (L) 36.0 - 46.0 %   MCV 94.8 78.0 - 100.0 fl   MCHC 32.7 30.0 - 36.0 g/dL   RDW 14.7 11.5 - 15.5 %   Platelets 124.0 (L) 150.0 - 400.0 K/uL   Neutrophils Relative % 52.9 43.0 - 77.0 %   Lymphocytes Relative 29.7 12.0 - 46.0 %   Monocytes Relative 13.0 (H) 3.0 - 12.0 %   Eosinophils Relative 3.2 0.0 - 5.0 %   Basophils Relative 1.2 0.0 - 3.0 %   Neutro Abs 2.3 1.4 - 7.7 K/uL   Lymphs Abs 1.3 0.7 - 4.0 K/uL   Monocytes Absolute 0.6 0.1 - 1.0 K/uL   Eosinophils Absolute 0.1 0.0 - 0.7 K/uL   Basophils Absolute 0.1 0.0 - 0.1 K/uL  VITAMIN D 25 Hydroxy (Vit-D Deficiency, Fractures)  Result Value Ref Range   VITD 70.23 30.00 - 100.00 ng/mL  T4, free  Result Value Ref Range   Free T4 0.95 0.60 - 1.60 ng/dL  Microalbumin / creatinine urine ratio  Result Value Ref Range   Microalb, Ur 32.9 (H) 0.0 - 1.9 mg/dL   Creatinine,U 114.4 mg/dL    Microalb Creat Ratio 28.8 0.0 - 30.0 mg/g    Assessment/Plan:  Post-surgical hypothyroidism TFTs stable. Continue levothyroxine.   Type 2 diabetes mellitus, uncontrolled, with renal complications (HCC) Chronic, stable on onglyza.   HTN (hypertension) Chronic, deteriorated. She will start checking blood pressures at home and call me in a few days - if persistently elevated, low threshold to add 3rd antihypertensive.   History of breast cancer Appreciate surgery/onc care of patient.   History of colon cancer H/o colon cancer 2005 s/p colectomy. Last colonoscopy 2008, overdue for f/u. She is considering f/u with Dr Bary Castilla her breast cancer surgeon as he also does colonoscopies. Encouraged f/u with surg for overdue colonoscopy.   Chronic kidney disease (CKD), stage V (HCC) Stable period. Overdue for renal f/u Candiss Norse). She will call later this month to schedule f/u. Overall feeling well.   Macular degeneration H/o this. Suggested trial lutein and zinc per her request. Consider preservision in the future. Reviewing latest eye clinic note, she also has h/o diabetic retinopathy as well as cataracts.   Diabetic retinopathy with macular edema ([place]) Continue f/u with ophtho.   Anemia in chronic kidney disease (CKD) Reviewed with patient.   Osteoporosis Continue calcium, vitamin D 5000 IU daily.    I discussed the assessment and treatment plan with the patient. The patient was provided an opportunity to ask questions and all were answered. The patient agreed with the plan and demonstrated an understanding of the instructions.  Lab Orders  No laboratory test(s) ordered today    Meds ordered this encounter  Medications  . saxagliptin HCl (ONGLYZA) 2.5 MG TABS tablet    Sig: Take 1 tablet (2.5 mg total) by mouth daily.    Dispense:  30 tablet    Refill:  6    The patient was advised to call back or seek an in-person evaluation if the symptoms worsen or if the condition fails to  improve as anticipated.  Ria Bush, MD

## 2019-01-06 NOTE — Assessment & Plan Note (Signed)
TFTs stable. Continue levothyroxine.  

## 2019-01-06 NOTE — Assessment & Plan Note (Signed)
Reviewed with patient

## 2019-01-06 NOTE — Assessment & Plan Note (Addendum)
H/o colon cancer 2005 s/p colectomy. Last colonoscopy 2008, overdue for f/u. She is considering f/u with Dr Bary Castilla her breast cancer surgeon as he also does colonoscopies. Encouraged f/u with surg for overdue colonoscopy.

## 2019-01-06 NOTE — Assessment & Plan Note (Signed)
Stable period. Overdue for renal f/u Candiss Norse). She will call later this month to schedule f/u. Overall feeling well.

## 2019-01-06 NOTE — Assessment & Plan Note (Signed)
Appreciate surgery/onc care of patient.

## 2019-01-06 NOTE — Assessment & Plan Note (Signed)
H/o this. Suggested trial lutein and zinc per her request. Consider preservision in the future. Reviewing latest eye clinic note, she also has h/o diabetic retinopathy as well as cataracts.

## 2019-01-06 NOTE — Assessment & Plan Note (Signed)
Chronic, stable on onglyza.

## 2019-01-06 NOTE — Assessment & Plan Note (Signed)
Continue f/u with ophtho

## 2019-01-06 NOTE — Assessment & Plan Note (Signed)
Continue calcium, vitamin D 5000 IU daily.

## 2019-01-07 ENCOUNTER — Other Ambulatory Visit: Payer: Self-pay

## 2019-01-07 ENCOUNTER — Encounter: Payer: Self-pay | Admitting: General Surgery

## 2019-01-07 ENCOUNTER — Ambulatory Visit (INDEPENDENT_AMBULATORY_CARE_PROVIDER_SITE_OTHER): Payer: Medicare Other | Admitting: General Surgery

## 2019-01-07 VITALS — BP 181/94 | HR 73 | Temp 97.3°F | Resp 16 | Ht 63.0 in | Wt 211.0 lb

## 2019-01-07 DIAGNOSIS — C50412 Malignant neoplasm of upper-outer quadrant of left female breast: Secondary | ICD-10-CM | POA: Diagnosis not present

## 2019-01-07 DIAGNOSIS — Z170 Estrogen receptor positive status [ER+]: Secondary | ICD-10-CM | POA: Diagnosis not present

## 2019-01-07 NOTE — Patient Instructions (Addendum)
We will call you to schedule your Mammogram and follow up appointment next year.

## 2019-01-07 NOTE — Progress Notes (Signed)
Patient ID: Alexis Tucker, female   DOB: 1940/08/08, 78 y.o.   MRN: [id number]  Chief Complaint  Patient presents with  . Follow-up    Bil diagnostic mammogram    HPI Alexis Tucker is a 78 y.o. female.  Here today for one-year follow-up having undergone excision of a second primary in July 2019.  She has no breast complaints.  HPI  Past Medical History:  Diagnosis Date  . Anemia    per prior pcp  . Breast cancer ([place]) 2008   F/U radiation   . Breast cancer ([place]) 11/27/2017   mpT2 pN0 ER/PR positive, HER-2/neu negative.  2.5 cm maximum diameter, multifocal.  MammoSite  . Carotid stenosis, asymptomatic 01/2014   mild ICA, severe in ECA, rec rpt [place] 2 years  . Cataracts, bilateral   . CKD (chronic kidney disease) stage 4, GFR 15-29 ml/min (HCC) 01/2012   Cr 3.85, in 3s since 2010, discussing HD and L forearm graft ([place])  . Diabetes type 2, controlled ([place]) 2000s  . Diabetic retinopathy with macular edema ([place]) 03/2014   [place] at [facility] eye on avastin  . DJD (degenerative joint disease)    per prior pcp  . GERD (gastroesophageal reflux disease)   . Glaucoma 03/2014  . History of breast cancer 2008   infiltrating ductal carcinoma s/p mammosite  . History of colon cancer 2006   s/p colectomy  . History of hepatitis A 1990s   from food, resolved  . HTN (hypertension)   . Macular degeneration    wet R with vision loss, dry L; to see retina specialist  . Neuropathy    per prior pcp  . OA (osteoarthritis)    knees and hips  . Osteonecrosis (HCC)    hips?  . Personal history of radiation therapy 2008   F/U Left Breast Cancer  . Personal history of radiation therapy 2019   mammosite left breast ca  . PONV (postoperative nausea and vomiting)   . Post-surgical hypothyroidism   . Wears dentures    partial upper    Past Surgical History:  Procedure Laterality Date  . ABDOMINAL HYSTERECTOMY  1976   partial, after tubal pregnancy, h/o fibroids with heavy bleeding  .  BREAST BIOPSY Left 2008   positive  . BREAST BIOPSY Left 11/22/2017   2oclock 2cmfn wing shaped clip, INVASIVE MAMMARY CARCINOMA  . BREAST BIOPSY Left 11/22/2017   2oclock 4cmfn coil shaped clip, INVASIVE MAMMARY CARCINOMA  . BREAST BIOPSY Left 11/22/2017   lymph node - butterfly hydroMARK  . BREAST EXCISIONAL BIOPSY Left 01/07/2018   lumpectomy by Dr. Bary Castilla  . BREAST LUMPECTOMY Left 2008   F/U radiation  . BREAST LUMPECTOMY Left 2019   2 areas of [place] at 2:00 position f/u with mammosite  . BREAST LUMPECTOMY WITH SENTINEL LYMPH NODE BIOPSY Left 01/07/2018   Procedure: BREAST LUMPECTOMY WITH SENTINEL LYMPH NODE BX;  Surgeon: Robert Bellow, MD;  Location: [facility] ORS;  Service: General;  Laterality: Left;  . BREAST MAMMOSITE  2008  . carotid ultrasound  12/2012   1-39% bilateral stenosis, severe in ECA  . CATARACT EXTRACTION W/PHACO Left 08/14/2016   Procedure: CATARACT EXTRACTION PHACO AND INTRAOCULAR LENS PLACEMENT (IOC)  Left Diabetic;  Surgeon: Ronnell Freshwater, MD;  Location: [place];  Service: Ophthalmology;  Laterality: Left;  Diabetic - oral meds  . CHOLECYSTECTOMY  2005  . COLECTOMY  2005   colon cancer  . COLON SURGERY  2006   bowel obstruction  .  COLONOSCOPY  2008   ext hem, ileo-colonic anastomosis in cecum, tubular adenoma x1, rec rpt 1 yr for multiple polyps (in [place])  . ESOPHAGOGASTRODUODENOSCOPY N/A 05/06/2016   Procedure: ESOPHAGOGASTRODUODENOSCOPY (EGD);  Surgeon: Manya Silvas, MD;  Location: [facility] ENDOSCOPY;  Service: Endoscopy;  Laterality: N/A;  . THYROIDECTOMY, PARTIAL     unclear why  . TOTAL KNEE ARTHROPLASTY Right 1999  . US ECHOCARDIOGRAPHY  11/2007   nl LV fxn EF 94%, diastolic dysfunction, LVH, tr TR and MR, slcerotic aortic valve    Family History  Problem Relation Age of Onset  . Diabetes Mother   . Hypertension Mother   . Arthritis Mother   . Alcohol abuse Father   . Arthritis Father   . Stroke Daughter   . CAD Neg Hx   .  Cancer Neg Hx   . Breast cancer Neg Hx     Social History Social History   Tobacco Use  . Smoking status: Former Research scientist (life sciences)  . Smokeless tobacco: Never Used  . Tobacco comment: quit over 40 yrs ago  Substance Use Topics  . Alcohol use: Yes    Comment: rare  . Drug use: Never    Allergies  Allergen Reactions  . Tramadol Other (See Comments)    nightmares  . Codeine Anxiety  . Sulfa Antibiotics Itching and Rash    Current Outpatient Medications  Medication Sig Dispense Refill  . acetaminophen (TYLENOL) 325 MG tablet Take 1 tablet (325 mg total) by mouth every 6 (six) hours as needed for mild pain (or Fever >/= 101).    . Acetylcysteine 600 MG CAPS Take by mouth daily.    Marland Kitchen allopurinol (ZYLOPRIM) 100 MG tablet TAKE 1 TABLET BY MOUTH EVERY DAY 90 tablet 0  . anastrozole (ARIMIDEX) 1 MG tablet TAKE 1 TABLET BY MOUTH EVERY DAY 90 tablet 1  . aspirin EC 81 MG EC tablet Take 1 tablet (81 mg total) by mouth daily. 30 tablet 12  . Blood Glucose Monitoring Suppl (ACURA BLOOD GLUCOSE METER) W/DEVICE KIT Use as directed to check sugars daily 250.42 1 kit 0  . CALCIUM-MAGNESIUM PO Take 1 tablet by mouth daily.    . carvedilol (COREG) 25 MG tablet TAKE 1 TABLET BY MOUTH EVERY DAY 30 tablet 3  . Cholecalciferol (VITAMIN D-3) 5000 UNITS TABS Take 1 tablet by mouth daily.    . diclofenac sodium (VOLTAREN) 1 % GEL APPLY 1 APPLICATION TOPICALLY 3 (THREE) TIMES DAILY. 100 g 0  . ferrous sulfate (SLOW IRON) 160 (50 Fe) MG TBCR SR tablet Take 1 tablet (160 mg total) by mouth daily. 90 each 3  . Folic Acid-Vit F2-XMD Y70 (FOLBEE) 2.5-25-1 MG TABS tablet Take 1 tablet by mouth daily.    . isosorbide dinitrate (ISORDIL) 20 MG tablet TAKE 1 TABLET BY MOUTH EVERY DAY 90 tablet 0  . latanoprost (XALATAN) 0.005 % ophthalmic solution Place 1 drop into both eyes at bedtime.    Marland Kitchen levothyroxine (SYNTHROID, LEVOTHROID) 88 MCG tablet TAKE 1 TABLET BY MOUTH DAILY 90 tablet 1  . pantoprazole (PROTONIX) 40 MG tablet  Take 1 tablet (40 mg total) by mouth daily as needed. 30 tablet 4  . pentoxifylline (TRENTAL) 400 MG CR tablet TAKE 1 TABLET BY MOUTH EVERY DAY 90 tablet 1  . promethazine (PHENERGAN) 12.5 MG tablet Take 1 tablet (12.5 mg total) by mouth every 6 (six) hours as needed for nausea or vomiting. 20 tablet 0  . saxagliptin HCl (ONGLYZA) 2.5 MG TABS tablet Take 1  tablet (2.5 mg total) by mouth daily. 30 tablet 6  . sodium bicarbonate 650 MG tablet Take 650 mg by mouth 2 (two) times daily as needed for heartburn.     No current facility-administered medications for this visit.     Review of Systems Review of Systems  Constitutional: Negative.   Respiratory: Negative.   Cardiovascular: Negative.     Blood pressure (!) 181/94, pulse 73, temperature (!) 97.3 F (36.3 C), temperature source Temporal, resp. rate 16, height _0  (1.6 m), weight 211 lb (95.7 kg), SpO2 98 %.  Physical Exam Physical Exam Constitutional:      Appearance: Normal appearance.  Pulmonary:     Effort: Pulmonary effort is normal.  Chest:    Lymphadenopathy:     Upper Body:     Right upper body: No supraclavicular or axillary adenopathy.     Left upper body: No supraclavicular or axillary adenopathy.  Skin:    General: Skin is warm and dry.  Neurological:     Mental Status: She is alert.     Data Reviewed Bilateral diagnostic mammograms dated January 01, 2019 were independently reviewed.  Postsurgical changes.  BI-RADS-2.  Last recorded colonoscopy was in [place] in 2014.  With her past history of colon cancer she would be candidate for repeat study.  Assessment Stable breast exam. Possibly overdue for colonoscopy screening.  Plan  We will call you to schedule your Mammogram and follow up appointment next year.  The patient asked for refill on her Imitrex.  This was provided.  We will confirm when her last colonoscopy was completed.  HPI, Physical Exam, Assessment and Plan have been scribed under the  direction and in the presence of Robert Bellow, MD. Alexis Tucker, CMA  I have completed the exam and reviewed the above documentation for accuracy and completeness.  I agree with the above.  Haematologist has been used and any errors in dictation or transcription are unintentional.  Hervey Ard, M.D., F.A.C.S.  Alexis Tucker 01/08/2019, 9:12 PM

## 2019-01-09 ENCOUNTER — Telehealth: Payer: Self-pay

## 2019-01-09 NOTE — Telephone Encounter (Signed)
Message left for the patient to call back. Per Dr Bary Castilla want to confirm when the patient's last colonoscopy was completed and obtain a copy. Last in our records is 2014 from [place].

## 2019-01-13 ENCOUNTER — Ambulatory Visit
Admission: RE | Admit: 2019-01-13 | Discharge: 2019-01-13 | Disposition: A | Discharge status: Home / Self Care | Payer: Medicare Other | Source: Ambulatory Visit | Attending: Radiation Oncology | Admitting: Radiation Oncology

## 2019-01-13 ENCOUNTER — Other Ambulatory Visit: Payer: Self-pay

## 2019-01-13 ENCOUNTER — Encounter: Payer: Self-pay | Admitting: Radiation Oncology

## 2019-01-13 VITALS — BP 182/85 | HR 76 | Temp 95.8°F | Resp 18 | Wt 211.2 lb

## 2019-01-13 DIAGNOSIS — Z170 Estrogen receptor positive status [ER+]: Secondary | ICD-10-CM | POA: Insufficient documentation

## 2019-01-13 DIAGNOSIS — Z79811 Long term (current) use of aromatase inhibitors: Secondary | ICD-10-CM | POA: Insufficient documentation

## 2019-01-13 DIAGNOSIS — Z923 Personal history of irradiation: Secondary | ICD-10-CM | POA: Insufficient documentation

## 2019-01-13 DIAGNOSIS — C50412 Malignant neoplasm of upper-outer quadrant of left female breast: Secondary | ICD-10-CM | POA: Insufficient documentation

## 2019-01-13 NOTE — Progress Notes (Signed)
Radiation Oncology Follow up Note  Name: Alexis Tucker   Date:   01/13/2019 MRN:  [id number] DOB: 08/06/1940    This 77 y.o. female presents to the clinic today for 6-month follow-up status post accelerated partial breast radiation to her left breast for ER PR positive invasive mammary carcinoma in patient treated back in 2008 with accelerated partial breast radiation to the same breast.  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  She had previously accelerated partial breast radiation dating back to 2008 in the same breast.  She is seen today in routine follow-up is doing well.  She specifically denies breast tenderness cough or bone pain..  She had a recent mammogram this month showing benign BI-RADS 2 changes.  She is currently on arimadex tolerating that well without side effect.  COMPLICATIONS OF TREATMENT: none  FOLLOW UP COMPLIANCE: keeps appointments   PHYSICAL EXAM:  BP (!) 182/85   Pulse 76   Temp (!) 95.8 F (35.4 C)   Resp 18   Wt 211 lb 3.2 oz (95.8 kg)   BMI 37.41 kg/m  Lungs are clear to A&P cardiac examination essentially unremarkable with regular rate and rhythm. No dominant mass or nodularity is noted in either breast in 2 positions examined. Incision is well-healed. No axillary or supraclavicular adenopathy is appreciated. Cosmetic result is excellent.  Well-developed well-nourished patient in NAD. HEENT reveals PERLA, EOMI, discs not visualized.  Oral cavity is clear. No oral mucosal lesions are identified. Neck is clear without evidence of cervical or supraclavicular adenopathy. Lungs are clear to A&P. Cardiac examination is essentially unremarkable with regular rate and rhythm without murmur rub or thrill. Abdomen is benign with no organomegaly or masses noted. Motor sensory  and DTR levels are equal and symmetric in the upper and lower extremities. Cranial nerves II through XII are grossly intact. Proprioception is intact. No peripheral adenopathy or edema is identified. No motor or sensory levels are noted. Crude visual fields are within normal range.  RADIOLOGY RESULTS: Mammogram reviewed compatible with above-stated findings  PLAN: Present time she is doing well with no evidence of disease 6 months out from accelerated partial breast radiation.  I am pleased with her overall progress.  I have asked to see her back in 6 months for follow-up.  She continues on arimadex.  Patient knows to call with any concerns at any time.  I would like to take this opportunity to thank you for allowing me to participate in the care of your patient..     , MD   

## 2019-01-16 NOTE — Telephone Encounter (Signed)
Called patient again to see when her last colonoscopy was done. Last in our record was in 2008 in [place] state.

## 2019-01-20 ENCOUNTER — Other Ambulatory Visit: Payer: Self-pay | Admitting: Family Medicine

## 2019-01-31 ENCOUNTER — Encounter: Payer: Self-pay | Admitting: General Surgery

## 2019-02-18 DIAGNOSIS — E113212 Type 2 diabetes mellitus with mild nonproliferative diabetic retinopathy with macular edema, left eye: Secondary | ICD-10-CM | POA: Diagnosis not present

## 2019-02-25 DIAGNOSIS — Z23 Encounter for immunization: Secondary | ICD-10-CM | POA: Diagnosis not present

## 2019-03-01 ENCOUNTER — Other Ambulatory Visit: Payer: Self-pay | Admitting: Family Medicine

## 2019-03-03 NOTE — Telephone Encounter (Signed)
LOV 01/06/2019, future appointment on 04/09/2019.

## 2019-03-12 DIAGNOSIS — E113291 Type 2 diabetes mellitus with mild nonproliferative diabetic retinopathy without macular edema, right eye: Secondary | ICD-10-CM | POA: Diagnosis not present

## 2019-03-13 ENCOUNTER — Other Ambulatory Visit: Payer: Self-pay | Admitting: Family Medicine

## 2019-03-17 ENCOUNTER — Other Ambulatory Visit: Payer: Self-pay | Admitting: Family Medicine

## 2019-03-26 DIAGNOSIS — E113212 Type 2 diabetes mellitus with mild nonproliferative diabetic retinopathy with macular edema, left eye: Secondary | ICD-10-CM | POA: Diagnosis not present

## 2019-04-09 ENCOUNTER — Ambulatory Visit (INDEPENDENT_AMBULATORY_CARE_PROVIDER_SITE_OTHER): Payer: Medicare Other | Admitting: Family Medicine

## 2019-04-09 ENCOUNTER — Encounter: Payer: Self-pay | Admitting: Family Medicine

## 2019-04-09 DIAGNOSIS — I10 Essential (primary) hypertension: Secondary | ICD-10-CM | POA: Diagnosis not present

## 2019-04-09 DIAGNOSIS — Z853 Personal history of malignant neoplasm of breast: Secondary | ICD-10-CM | POA: Diagnosis not present

## 2019-04-09 DIAGNOSIS — IMO0002 Reserved for concepts with insufficient information to code with codable children: Secondary | ICD-10-CM

## 2019-04-09 DIAGNOSIS — N185 Chronic kidney disease, stage 5: Secondary | ICD-10-CM | POA: Diagnosis not present

## 2019-04-09 DIAGNOSIS — E118 Type 2 diabetes mellitus with unspecified complications: Secondary | ICD-10-CM | POA: Diagnosis not present

## 2019-04-09 DIAGNOSIS — E1122 Type 2 diabetes mellitus with diabetic chronic kidney disease: Secondary | ICD-10-CM

## 2019-04-09 DIAGNOSIS — E1165 Type 2 diabetes mellitus with hyperglycemia: Secondary | ICD-10-CM

## 2019-04-09 MED ORDER — LUTEIN 10 MG PO TABS: 1.0000 | ORAL_TABLET | Freq: Every day | ORAL | Status: DC

## 2019-04-09 NOTE — Assessment & Plan Note (Signed)
Not on dialysis at this time. Has not f/u with renal. Again emphasized importance of f/u with Dr Candiss Norse and encouraged she call to schedule an appointment before the end of the year. Denies kidney failure symptoms at this time.

## 2019-04-09 NOTE — Assessment & Plan Note (Signed)
Chronic, seems well controlled on onglyza based on recall cbg's. Continue to monitor.

## 2019-04-09 NOTE — Progress Notes (Signed)
   Alexis Tucker - 78 y.o. female  MRN [id number]  Date of Birth: 05-27-1941  PCP: Ria Bush, MD  This service was provided via telemedicine. Phone Visit performed on 04/09/2019    Rationale for phone visit along with limitations reviewed. Patient consented to telephone encounter.    Location of patient: at home Location of provider: in office, [place] @ [facility] Name of referring provider: N/A   Names of persons and role in encounter: Provider: Ria Bush, MD  Patient: Alexis Tucker  Other: N/A  Time on call: 12:24pm - 12:37pm   Subjective: Chief Complaint  Patient presents with  . Follow-up    3 mo f/u.    HPI:  Complicated year including treatment for breast cancer - good report from Dr Fleet Contras and Baruch Gouty. Now on arimidex daily.   Known stage V CKD followed by Dr Candiss Norse - has still not seen. Again encouraged to call to schedule appt.   HTN - Compliant with current antihypertensive regimen of carvedilol 25mg  once daily and isosorbide dinitrate 20mg  daily. Does check blood pressures at home weekly, running well controlled. No low blood pressure readings or symptoms of dizziness/syncope. Denies HA, vision changes, CP/tightness, SOB, leg swelling.   DM - does regularly check sugars throughout the day 90-120. Compliant with antihyperglycemic regimen which includes: onglyza 2.5mg  daily. Denies low sugars or hypoglycemic symptoms. Denies paresthesias. Last diabetic eye exam 03/2019. Pneumovax: 2011. Prevnar: 2015. Glucometer brand: acura.  Lab Results  Component Value Date   HGBA1C 6.5 12/25/2018   Diabetic Foot Exam - Simple   No data filed     Lab Results  Component Value Date   MICROALBUR 32.9 (H) 12/25/2018    Objective/Observations:  No physical exam or vital signs collected unless specifically identified below.   BP 125/60   Pulse (!) 54   Wt 210 lb (95.3 kg)   BMI 37.20 kg/m   Wt Readings from Last 3 Encounters:  04/09/19 210 lb (95.3  kg)  01/13/19 211 lb 3.2 oz (95.8 kg)  01/07/19 211 lb (95.7 kg)   Respiratory status: speaks in complete sentences without evident shortness of breath.    Assessment/Plan:  CKD stage 5 due to type 2 diabetes mellitus ([place]) Not on dialysis at this time. Has not f/u with renal. Again emphasized importance of f/u with Dr Candiss Norse and encouraged she call to schedule an appointment before the end of the year. Denies kidney failure symptoms at this time.  History of breast cancer Appreciate surg/onc care.   HTN (hypertension) Significant improvement in blood pressure control continue current regimen.   Diabetes mellitus type 2, uncontrolled, with complications (HCC) Chronic, seems well controlled on onglyza based on recall cbg's. Continue to monitor.    I discussed the assessment and treatment plan with the patient. The patient was provided an opportunity to ask questions and all were answered. The patient agreed with the plan and demonstrated an understanding of the instructions.  Lab Orders  No laboratory test(s) ordered today    Meds ordered this encounter  Medications  . Lutein 10 MG TABS    Sig: Take 1 tablet (10 mg total) by mouth daily.    The patient was advised to call back or seek an in-person evaluation if the symptoms worsen or if the condition fails to improve as anticipated.  Ria Bush, MD

## 2019-04-09 NOTE — Assessment & Plan Note (Signed)
Appreciate surg/onc care.

## 2019-04-09 NOTE — Assessment & Plan Note (Signed)
Significant improvement in blood pressure control continue current regimen.

## 2019-04-30 DIAGNOSIS — E113212 Type 2 diabetes mellitus with mild nonproliferative diabetic retinopathy with macular edema, left eye: Secondary | ICD-10-CM | POA: Diagnosis not present

## 2019-05-19 ENCOUNTER — Other Ambulatory Visit: Payer: Self-pay | Admitting: Family Medicine

## 2019-05-30 ENCOUNTER — Other Ambulatory Visit: Payer: Self-pay | Admitting: Family Medicine

## 2019-07-09 DIAGNOSIS — E113312 Type 2 diabetes mellitus with moderate nonproliferative diabetic retinopathy with macular edema, left eye: Secondary | ICD-10-CM | POA: Diagnosis not present

## 2019-07-25 ENCOUNTER — Ambulatory Visit: Admission: RE | Admit: 2019-07-25 | Payer: Medicare Other | Source: Ambulatory Visit | Admitting: Radiation Oncology

## 2019-08-13 DIAGNOSIS — E113312 Type 2 diabetes mellitus with moderate nonproliferative diabetic retinopathy with macular edema, left eye: Secondary | ICD-10-CM | POA: Diagnosis not present

## 2019-08-22 ENCOUNTER — Other Ambulatory Visit: Payer: Self-pay | Admitting: Family Medicine

## 2019-09-11 ENCOUNTER — Other Ambulatory Visit: Payer: Self-pay | Admitting: Family Medicine

## 2019-09-17 DIAGNOSIS — E113312 Type 2 diabetes mellitus with moderate nonproliferative diabetic retinopathy with macular edema, left eye: Secondary | ICD-10-CM | POA: Diagnosis not present

## 2019-09-28 ENCOUNTER — Other Ambulatory Visit: Payer: Self-pay | Admitting: Family Medicine

## 2019-09-29 DIAGNOSIS — E113312 Type 2 diabetes mellitus with moderate nonproliferative diabetic retinopathy with macular edema, left eye: Secondary | ICD-10-CM | POA: Diagnosis not present

## 2019-09-29 DIAGNOSIS — H532 Diplopia: Secondary | ICD-10-CM | POA: Diagnosis not present

## 2019-09-29 DIAGNOSIS — H401123 Primary open-angle glaucoma, left eye, severe stage: Secondary | ICD-10-CM | POA: Diagnosis not present

## 2019-09-29 DIAGNOSIS — H3582 Retinal ischemia: Secondary | ICD-10-CM | POA: Diagnosis not present

## 2019-09-29 DIAGNOSIS — H50112 Monocular exotropia, left eye: Secondary | ICD-10-CM | POA: Diagnosis not present

## 2019-09-29 DIAGNOSIS — H4902 Third [oculomotor] nerve palsy, left eye: Secondary | ICD-10-CM | POA: Diagnosis not present

## 2019-09-30 NOTE — Telephone Encounter (Signed)
Trental Last filled:  05/30/19, #90 Last OV:  04/09/19, 3 mo f/u Next OV:  none

## 2019-11-03 DIAGNOSIS — E113312 Type 2 diabetes mellitus with moderate nonproliferative diabetic retinopathy with macular edema, left eye: Secondary | ICD-10-CM | POA: Diagnosis not present

## 2019-12-04 ENCOUNTER — Other Ambulatory Visit: Payer: Self-pay

## 2019-12-04 DIAGNOSIS — Z170 Estrogen receptor positive status [ER+]: Secondary | ICD-10-CM

## 2019-12-16 ENCOUNTER — Other Ambulatory Visit: Payer: Self-pay | Admitting: General Surgery

## 2019-12-16 DIAGNOSIS — Z170 Estrogen receptor positive status [ER+]: Secondary | ICD-10-CM

## 2020-01-03 ENCOUNTER — Other Ambulatory Visit: Payer: Self-pay | Admitting: Family Medicine

## 2020-01-06 ENCOUNTER — Other Ambulatory Visit: Payer: Medicare Other

## 2020-01-09 ENCOUNTER — Ambulatory Visit
Admission: RE | Admit: 2020-01-09 | Discharge: 2020-01-09 | Disposition: A | Discharge status: Home / Self Care | Payer: Medicare Other | Source: Ambulatory Visit | Attending: Surgery | Admitting: Surgery

## 2020-01-09 DIAGNOSIS — Z170 Estrogen receptor positive status [ER+]: Secondary | ICD-10-CM | POA: Diagnosis present

## 2020-01-09 DIAGNOSIS — Z853 Personal history of malignant neoplasm of breast: Secondary | ICD-10-CM | POA: Diagnosis not present

## 2020-01-09 DIAGNOSIS — C50412 Malignant neoplasm of upper-outer quadrant of left female breast: Secondary | ICD-10-CM | POA: Insufficient documentation

## 2020-01-09 DIAGNOSIS — R922 Inconclusive mammogram: Secondary | ICD-10-CM | POA: Diagnosis not present

## 2020-01-14 ENCOUNTER — Ambulatory Visit: Payer: Medicare Other | Admitting: Surgery

## 2020-01-16 ENCOUNTER — Ambulatory Visit: Payer: Medicare Other | Admitting: Surgery

## 2020-02-03 ENCOUNTER — Other Ambulatory Visit: Payer: Self-pay | Admitting: Family Medicine

## 2020-02-04 NOTE — Telephone Encounter (Signed)
E-scribed refill.  Plz schedule wellness, cpe and lab visits.  

## 2020-02-27 DIAGNOSIS — H401123 Primary open-angle glaucoma, left eye, severe stage: Secondary | ICD-10-CM | POA: Diagnosis not present

## 2020-02-27 DIAGNOSIS — E113312 Type 2 diabetes mellitus with moderate nonproliferative diabetic retinopathy with macular edema, left eye: Secondary | ICD-10-CM | POA: Diagnosis not present

## 2020-02-27 DIAGNOSIS — H401112 Primary open-angle glaucoma, right eye, moderate stage: Secondary | ICD-10-CM | POA: Diagnosis not present

## 2020-02-27 DIAGNOSIS — E113411 Type 2 diabetes mellitus with severe nonproliferative diabetic retinopathy with macular edema, right eye: Secondary | ICD-10-CM | POA: Diagnosis not present

## 2020-03-03 ENCOUNTER — Other Ambulatory Visit: Payer: Self-pay | Admitting: Family Medicine

## 2020-03-03 NOTE — Telephone Encounter (Signed)
E-scribed refills.  Plz schedule wellness, cpe and lab visits.  

## 2020-03-17 DIAGNOSIS — Z961 Presence of intraocular lens: Secondary | ICD-10-CM | POA: Diagnosis not present

## 2020-03-17 DIAGNOSIS — H401123 Primary open-angle glaucoma, left eye, severe stage: Secondary | ICD-10-CM | POA: Diagnosis not present

## 2020-03-17 DIAGNOSIS — E113411 Type 2 diabetes mellitus with severe nonproliferative diabetic retinopathy with macular edema, right eye: Secondary | ICD-10-CM | POA: Diagnosis not present

## 2020-03-17 DIAGNOSIS — H3582 Retinal ischemia: Secondary | ICD-10-CM | POA: Diagnosis not present

## 2020-03-17 DIAGNOSIS — H401112 Primary open-angle glaucoma, right eye, moderate stage: Secondary | ICD-10-CM | POA: Diagnosis not present

## 2020-03-17 DIAGNOSIS — H53411 Scotoma involving central area, right eye: Secondary | ICD-10-CM | POA: Diagnosis not present

## 2020-03-17 DIAGNOSIS — E113312 Type 2 diabetes mellitus with moderate nonproliferative diabetic retinopathy with macular edema, left eye: Secondary | ICD-10-CM | POA: Diagnosis not present

## 2020-03-20 ENCOUNTER — Other Ambulatory Visit: Payer: Self-pay

## 2020-03-20 ENCOUNTER — Ambulatory Visit (INDEPENDENT_AMBULATORY_CARE_PROVIDER_SITE_OTHER): Payer: Medicare Other

## 2020-03-20 DIAGNOSIS — Z23 Encounter for immunization: Secondary | ICD-10-CM

## 2020-03-23 ENCOUNTER — Other Ambulatory Visit: Payer: Self-pay | Admitting: Family Medicine

## 2020-03-23 NOTE — Telephone Encounter (Signed)
Trental Last filled:  12/30/19, #90 Last OV:  04/09/19, 3 mo f/u Next OV:  none

## 2020-04-05 ENCOUNTER — Other Ambulatory Visit: Payer: Self-pay | Admitting: Family Medicine

## 2020-04-07 DIAGNOSIS — H401123 Primary open-angle glaucoma, left eye, severe stage: Secondary | ICD-10-CM | POA: Diagnosis not present

## 2020-04-07 DIAGNOSIS — E113212 Type 2 diabetes mellitus with mild nonproliferative diabetic retinopathy with macular edema, left eye: Secondary | ICD-10-CM | POA: Diagnosis not present

## 2020-04-08 ENCOUNTER — Ambulatory Visit: Payer: Medicare Other | Admitting: Sports Medicine

## 2020-04-15 ENCOUNTER — Ambulatory Visit: Payer: Medicare Other | Admitting: Sports Medicine

## 2020-04-16 DIAGNOSIS — E113312 Type 2 diabetes mellitus with moderate nonproliferative diabetic retinopathy with macular edema, left eye: Secondary | ICD-10-CM | POA: Diagnosis not present

## 2020-04-27 ENCOUNTER — Telehealth: Payer: Self-pay | Admitting: Family Medicine

## 2020-04-27 NOTE — Telephone Encounter (Signed)
PT DAUGTHER CALLED IN WANTED TO KNOW ABOUT GETTING REFILL ON MEDICATION (ONGLYZA).  PLEASE ADVISE  BEST CONTACT [phone]  SAME PHARM[facility]Y [facility] Address: [address], [place],  [address]

## 2020-04-29 MED ORDER — ONGLYZA 2.5 MG PO TABS: 2.5000 mg | ORAL_TABLET | Freq: Every day | ORAL | 3 refills | Status: DC

## 2020-04-29 NOTE — Telephone Encounter (Signed)
Called Santiago Glad, pt's daughter (DPR) advised her refill sent and confirmed upcoming appt.

## 2020-04-29 NOTE — Telephone Encounter (Signed)
Sent in.  Pt needs to keep OV scheduled 06/2020.

## 2020-05-25 ENCOUNTER — Other Ambulatory Visit: Payer: Self-pay | Admitting: Family Medicine

## 2020-05-28 ENCOUNTER — Other Ambulatory Visit: Payer: Self-pay | Admitting: Family Medicine

## 2020-06-08 DIAGNOSIS — Z23 Encounter for immunization: Secondary | ICD-10-CM | POA: Diagnosis not present

## 2020-06-10 ENCOUNTER — Other Ambulatory Visit: Payer: Self-pay | Admitting: Family Medicine

## 2020-06-10 DIAGNOSIS — M810 Age-related osteoporosis without current pathological fracture: Secondary | ICD-10-CM

## 2020-06-10 DIAGNOSIS — E890 Postprocedural hypothyroidism: Secondary | ICD-10-CM

## 2020-06-10 DIAGNOSIS — IMO0002 Reserved for concepts with insufficient information to code with codable children: Secondary | ICD-10-CM

## 2020-06-10 DIAGNOSIS — E1122 Type 2 diabetes mellitus with diabetic chronic kidney disease: Secondary | ICD-10-CM

## 2020-06-10 DIAGNOSIS — E1165 Type 2 diabetes mellitus with hyperglycemia: Secondary | ICD-10-CM

## 2020-06-11 ENCOUNTER — Other Ambulatory Visit (INDEPENDENT_AMBULATORY_CARE_PROVIDER_SITE_OTHER): Payer: Medicare Other

## 2020-06-11 ENCOUNTER — Telehealth: Payer: Self-pay

## 2020-06-11 ENCOUNTER — Other Ambulatory Visit: Payer: Self-pay

## 2020-06-11 DIAGNOSIS — E890 Postprocedural hypothyroidism: Secondary | ICD-10-CM | POA: Diagnosis not present

## 2020-06-11 DIAGNOSIS — IMO0002 Reserved for concepts with insufficient information to code with codable children: Secondary | ICD-10-CM

## 2020-06-11 DIAGNOSIS — E1165 Type 2 diabetes mellitus with hyperglycemia: Secondary | ICD-10-CM

## 2020-06-11 DIAGNOSIS — E1122 Type 2 diabetes mellitus with diabetic chronic kidney disease: Secondary | ICD-10-CM | POA: Diagnosis not present

## 2020-06-11 DIAGNOSIS — M810 Age-related osteoporosis without current pathological fracture: Secondary | ICD-10-CM

## 2020-06-11 DIAGNOSIS — E118 Type 2 diabetes mellitus with unspecified complications: Secondary | ICD-10-CM

## 2020-06-11 DIAGNOSIS — N185 Chronic kidney disease, stage 5: Secondary | ICD-10-CM

## 2020-06-11 LAB — COMPREHENSIVE METABOLIC PANEL
ALT: 13 U/L (ref 0–35)
AST: 20 U/L (ref 0–37)
Albumin: 3.8 g/dL (ref 3.5–5.2)
Alkaline Phosphatase: 77 U/L (ref 39–117)
BUN: 38 mg/dL — ABNORMAL HIGH (ref 6–23)
CO2: 24 mEq/L (ref 19–32)
Calcium: 9.5 mg/dL (ref 8.4–10.5)
Chloride: 112 mEq/L (ref 96–112)
Creatinine, Ser: 4.59 mg/dL (ref 0.40–1.20)
GFR: 8.75 mL/min — CL (ref >60.00)
Glucose, Bld: 147 mg/dL — ABNORMAL HIGH (ref 70–99)
Potassium: 5 mEq/L (ref 3.5–5.1)
Sodium: 143 mEq/L (ref 135–145)
Total Bilirubin: 0.4 mg/dL (ref 0.2–1.2)
Total Protein: 6.5 g/dL (ref 6.0–8.3)

## 2020-06-11 LAB — PHOSPHORUS: Phosphorus: 3.6 mg/dL (ref 2.3–4.6)

## 2020-06-11 LAB — CBC WITH DIFFERENTIAL/PLATELET
Basophils Absolute: 0 10*3/uL (ref 0.0–0.1)
Basophils Relative: 1 % (ref 0.0–3.0)
Eosinophils Absolute: 0.2 10*3/uL (ref 0.0–0.7)
Eosinophils Relative: 4.9 % (ref 0.0–5.0)
HCT: 32.9 % — ABNORMAL LOW (ref 36.0–46.0)
Hemoglobin: 10.5 g/dL — ABNORMAL LOW (ref 12.0–15.0)
Lymphocytes Relative: 26.4 % (ref 12.0–46.0)
Lymphs Abs: 0.9 10*3/uL (ref 0.7–4.0)
MCHC: 32 g/dL (ref 30.0–36.0)
MCV: 94.4 fl (ref 78.0–100.0)
Monocytes Absolute: 0.7 10*3/uL (ref 0.1–1.0)
Monocytes Relative: 19.2 % — ABNORMAL HIGH (ref 3.0–12.0)
Neutro Abs: 1.6 10*3/uL (ref 1.4–7.7)
Neutrophils Relative %: 48.5 % (ref 43.0–77.0)
Platelets: 102 10*3/uL — ABNORMAL LOW (ref 150.0–400.0)
RBC: 3.48 Mil/uL — ABNORMAL LOW (ref 3.87–5.11)
RDW: 15.6 % — ABNORMAL HIGH (ref 11.5–15.5)
WBC: 3.4 10*3/uL — ABNORMAL LOW (ref 4.0–10.5)

## 2020-06-11 LAB — LIPID PANEL
Cholesterol: 168 mg/dL (ref 0–200)
HDL: 52.4 mg/dL (ref >39.00)
LDL Cholesterol: 100 mg/dL — ABNORMAL HIGH (ref 0–99)
NonHDL: 115.9
Total CHOL/HDL Ratio: 3
Triglycerides: 80 mg/dL (ref 0.0–149.0)
VLDL: 16 mg/dL (ref 0.0–40.0)

## 2020-06-11 LAB — VITAMIN D 25 HYDROXY (VIT D DEFICIENCY, FRACTURES): VITD: 69.91 ng/mL (ref 30.00–100.00)

## 2020-06-11 LAB — TSH: TSH: 3.27 u[IU]/mL (ref 0.35–4.50)

## 2020-06-11 LAB — HEMOGLOBIN A1C: Hgb A1c MFr Bld: 6.4 % (ref 4.6–6.5)

## 2020-06-11 NOTE — Telephone Encounter (Signed)
[facility] called critical results @ 1450  Creatinine 4.59 GFR 8.75

## 2020-06-11 NOTE — Telephone Encounter (Addendum)
Known CKD stage 5

## 2020-06-14 LAB — PARATHYROID HORMONE, INTACT (NO CA): PTH: 311 pg/mL — ABNORMAL HIGH (ref 14–64)

## 2020-06-16 ENCOUNTER — Telehealth: Payer: Self-pay

## 2020-06-16 ENCOUNTER — Other Ambulatory Visit: Payer: Self-pay

## 2020-06-16 ENCOUNTER — Ambulatory Visit: Payer: Medicare Other

## 2020-06-16 NOTE — Telephone Encounter (Signed)
Called patient four times trying to complete her AWV. Patient never answered. Left message notifying patient I had called. Appointment cancelled.

## 2020-06-18 ENCOUNTER — Ambulatory Visit (INDEPENDENT_AMBULATORY_CARE_PROVIDER_SITE_OTHER): Payer: Medicare Other | Admitting: Family Medicine

## 2020-06-18 ENCOUNTER — Other Ambulatory Visit: Payer: Self-pay

## 2020-06-18 ENCOUNTER — Encounter: Payer: Self-pay | Admitting: Family Medicine

## 2020-06-18 VITALS — BP 166/92 | HR 85 | Temp 97.9°F | Ht 62.5 in | Wt 208.6 lb

## 2020-06-18 DIAGNOSIS — K219 Gastro-esophageal reflux disease without esophagitis: Secondary | ICD-10-CM | POA: Diagnosis not present

## 2020-06-18 DIAGNOSIS — E1122 Type 2 diabetes mellitus with diabetic chronic kidney disease: Secondary | ICD-10-CM

## 2020-06-18 DIAGNOSIS — E118 Type 2 diabetes mellitus with unspecified complications: Secondary | ICD-10-CM | POA: Diagnosis not present

## 2020-06-18 DIAGNOSIS — Z0000 Encounter for general adult medical examination without abnormal findings: Secondary | ICD-10-CM

## 2020-06-18 DIAGNOSIS — Z853 Personal history of malignant neoplasm of breast: Secondary | ICD-10-CM

## 2020-06-18 DIAGNOSIS — E11311 Type 2 diabetes mellitus with unspecified diabetic retinopathy with macular edema: Secondary | ICD-10-CM

## 2020-06-18 DIAGNOSIS — E1165 Type 2 diabetes mellitus with hyperglycemia: Secondary | ICD-10-CM

## 2020-06-18 DIAGNOSIS — M810 Age-related osteoporosis without current pathological fracture: Secondary | ICD-10-CM

## 2020-06-18 DIAGNOSIS — N2581 Secondary hyperparathyroidism of renal origin: Secondary | ICD-10-CM

## 2020-06-18 DIAGNOSIS — Z748 Other problems related to care provider dependency: Secondary | ICD-10-CM

## 2020-06-18 DIAGNOSIS — D696 Thrombocytopenia, unspecified: Secondary | ICD-10-CM

## 2020-06-18 DIAGNOSIS — I6523 Occlusion and stenosis of bilateral carotid arteries: Secondary | ICD-10-CM

## 2020-06-18 DIAGNOSIS — H401132 Primary open-angle glaucoma, bilateral, moderate stage: Secondary | ICD-10-CM

## 2020-06-18 DIAGNOSIS — N185 Chronic kidney disease, stage 5: Secondary | ICD-10-CM

## 2020-06-18 DIAGNOSIS — E890 Postprocedural hypothyroidism: Secondary | ICD-10-CM | POA: Diagnosis not present

## 2020-06-18 DIAGNOSIS — H3530 Unspecified macular degeneration: Secondary | ICD-10-CM

## 2020-06-18 DIAGNOSIS — Z85038 Personal history of other malignant neoplasm of large intestine: Secondary | ICD-10-CM | POA: Diagnosis not present

## 2020-06-18 DIAGNOSIS — Z8673 Personal history of transient ischemic attack (TIA), and cerebral infarction without residual deficits: Secondary | ICD-10-CM

## 2020-06-18 DIAGNOSIS — IMO0002 Reserved for concepts with insufficient information to code with codable children: Secondary | ICD-10-CM

## 2020-06-18 DIAGNOSIS — D631 Anemia in chronic kidney disease: Secondary | ICD-10-CM

## 2020-06-18 DIAGNOSIS — I10 Essential (primary) hypertension: Secondary | ICD-10-CM

## 2020-06-18 DIAGNOSIS — Z7189 Other specified counseling: Secondary | ICD-10-CM | POA: Diagnosis not present

## 2020-06-18 MED ORDER — LEVOTHYROXINE SODIUM 88 MCG PO TABS: 88.0000 ug | ORAL_TABLET | Freq: Every day | ORAL | 3 refills | Status: DC

## 2020-06-18 MED ORDER — ONGLYZA 2.5 MG PO TABS: 2.5000 mg | ORAL_TABLET | Freq: Every day | ORAL | 11 refills | Status: DC

## 2020-06-18 MED ORDER — ISOSORBIDE DINITRATE 20 MG PO TABS: 20.0000 mg | ORAL_TABLET | Freq: Every day | ORAL | 3 refills | Status: DC

## 2020-06-18 MED ORDER — CARVEDILOL 25 MG PO TABS: 25.0000 mg | ORAL_TABLET | Freq: Every day | ORAL | 3 refills | Status: DC

## 2020-06-18 MED ORDER — PANTOPRAZOLE SODIUM 40 MG PO TBEC: 40.0000 mg | DELAYED_RELEASE_TABLET | Freq: Every day | ORAL | 1 refills | Status: DC | PRN

## 2020-06-18 MED ORDER — PENTOXIFYLLINE ER 400 MG PO TBCR: 400.0000 mg | EXTENDED_RELEASE_TABLET | Freq: Every day | ORAL | 3 refills | Status: DC

## 2020-06-18 MED ORDER — ALLOPURINOL 100 MG PO TABS: 100.0000 mg | ORAL_TABLET | Freq: Every day | ORAL | 3 refills | Status: DC

## 2020-06-18 NOTE — Patient Instructions (Addendum)
You are due to see onc (Chrystal) and surgery (Byrnett) - last seen 01/2019.  Keep appointment with Dr Candiss Norse for kidneys for 08/2020.  We will order neck ultrasound to monitor carotid artery stenosis.  Bring [place] copy of your advanced directive to update your chart. Consider DNR order if that is your wish - we have a specific form we can send to your home.  Return in 6 months for diabetes check.   Health Maintenance After Age 77 After age 70, you are at a higher risk for certain long-term diseases and infections as well as injuries from falls. Falls are a major cause of broken bones and head injuries in people who are older than age 102. Getting regular preventive care can help to keep you healthy and well. Preventive care includes getting regular testing and making lifestyle changes as recommended by your health care provider. Talk with your health care provider about:  Which screenings and tests you should have. A screening is a test that checks for a disease when you have no symptoms.  A diet and exercise plan that is right for you. What should I know about screenings and tests to prevent falls? Screening and testing are the best ways to find a health problem early. Early diagnosis and treatment give you the best chance of managing medical conditions that are common after age 11. Certain conditions and lifestyle choices may make you more likely to have a fall. Your health care provider may recommend:  Regular vision checks. Poor vision and conditions such as cataracts can make you more likely to have a fall. If you wear glasses, make sure to get your prescription updated if your vision changes.  Medicine review. Work with your health care provider to regularly review all of the medicines you are taking, including over-the-counter medicines. Ask your health care provider about any side effects that may make you more likely to have a fall. Tell your health care provider if any medicines that you take  make you feel dizzy or sleepy.  Osteoporosis screening. Osteoporosis is a condition that causes the bones to get weaker. This can make the bones weak and cause them to break more easily.  Blood pressure screening. Blood pressure changes and medicines to control blood pressure can make you feel dizzy.  Strength and balance checks. Your health care provider may recommend certain tests to check your strength and balance while standing, walking, or changing positions.  Foot health exam. Foot pain and numbness, as well as not wearing proper footwear, can make you more likely to have a fall.  Depression screening. You may be more likely to have a fall if you have a fear of falling, feel emotionally low, or feel unable to do activities that you used to do.  Alcohol use screening. Using too much alcohol can affect your balance and may make you more likely to have a fall. What actions can I take to lower my risk of falls? General instructions  Talk with your health care provider about your risks for falling. Tell your health care provider if: ? You fall. Be sure to tell your health care provider about all falls, even ones that seem minor. ? You feel dizzy, sleepy, or off-balance.  Take over-the-counter and prescription medicines only as told by your health care provider. These include any supplements.  Eat a healthy diet and maintain a healthy weight. A healthy diet includes low-fat dairy products, low-fat (lean) meats, and fiber from whole grains, beans, and  lots of fruits and vegetables. Home safety  Remove any tripping hazards, such as rugs, cords, and clutter.  Install safety equipment such as grab bars in bathrooms and safety rails on stairs.  Keep rooms and walkways well-lit. Activity   Follow a regular exercise program to stay fit. This will help you maintain your balance. Ask your health care provider what types of exercise are appropriate for you.  If you need a cane or walker, use  it as recommended by your health care provider.  Wear supportive shoes that have nonskid soles. Lifestyle  Do not drink alcohol if your health care provider tells you not to drink.  If you drink alcohol, limit how much you have: ? 0-1 drink a day for women. ? 0-2 drinks a day for men.  Be aware of how much alcohol is in your drink. In the [place] one drink equals one typical bottle of beer (12 oz), one-half glass of wine (5 oz), or one shot of hard liquor (1 oz).  Do not use any products that contain nicotine or tobacco, such as cigarettes and e-cigarettes. If you need help quitting, ask your health care provider. Summary  Having a healthy lifestyle and getting preventive care can help to protect your health and wellness after age 45.  Screening and testing are the best way to find a health problem early and help you avoid having a fall. Early diagnosis and treatment give you the best chance for managing medical conditions that are more common for people who are older than age 58.  Falls are a major cause of broken bones and head injuries in people who are older than age 51. Take precautions to prevent a fall at home.  Work with your health care provider to learn what changes you can make to improve your health and wellness and to prevent falls. This information is not intended to replace advice given to you by your health care provider. Make sure you discuss any questions you have with your health care provider. Document Revised: 10/10/2018 Document Reviewed: 05/02/2017 Elsevier Patient Education  2020 [employer].

## 2020-06-18 NOTE — Assessment & Plan Note (Signed)

## 2020-06-18 NOTE — Progress Notes (Signed)
Patient ID: Alexis Tucker, female    DOB: [birth year], 79 y.o.   MRN: [id number]  This visit was conducted in person.  BP (!) 166/92 (BP Location: Right Arm, Patient Position: Sitting, Cuff Size: Large)   Pulse 85   Temp 97.9 F (36.6 C) (Temporal)   Ht 5' 2.5" (1.588 m)   Wt 208 lb 9 oz (94.6 kg)   SpO2 98%   BMI 37.54 kg/m    CC: AMW Subjective:   HPI: Alexis Tucker is a 79 y.o. female presenting on 06/18/2020 for Medicare Wellness   Did not see health advisor this year.    Hearing Screening   _0  _1  _2  _3  _4  _5  _6  _7  _8   Right ear:   0 0 0  0    Left ear:   20 40 20  0    Vision Screening Comments: Last eye exam, 05/2020.  [place] Office Visit from 06/18/2020 in [place] [facility] [place]  PHQ-2 Total Score 0      Fall Risk  06/18/2020 01/07/2019 12/25/2018 01/28/2018 11/02/2016  Falls in the past year? 0 0 0 No No  Risk for fall due to : - - - - -   Last year had complicated year including treatment for breast cancer (Byrnett and [facility]). Continues arimidex daily. Last seen 01/2019.   Known stage V CKD followed by Dr Candiss Norse - has not seen since 2019. Upcoming appt 08/2020. Denies nausea, somnolence, abd pain. Some ongoing fatigue.   Preventative: COLONOSCOPY Date: 2008 ext hem, ileo-colonic anastomosis in cecum, tubular adenoma x1, rec rpt 1 yr for multiple polyps (done in [place]) - not completed. H/o colon cancer 2005 s/p colectomy.did not return last iFOB.  Well woman was 2008 (OBGYN at [facility]s/p hysterectomy 1976. Breast cancer treated 2020, now on arimidex. Latest dx mammogram 01/2020 Birads2.  DEXA 08/2017 - T -2.4 hip, -2.7 forearm; osteoporosis. Discussed weight bearing exercise, calcium in the diet and supplement as well as vitamin D 5000 IU daily. Declines repeat at time. Flu shot yearly  [place] 08/2019 x2, booster 06/2020 Pneumovax 2011, prevnar 2015 Tetanus unsure Shingles - received  through [facility] Advanced directive planning: discussed. Has this at home. HCPOA are daughters Santiago Glad and [place]) and granddaughter Frederico Hamman). Asked to bring [place] copy. Considering DNR.  Seat belt use discussed.  Sunscreen use discussed. No changing moles on skin.  Non smoker  Alcohol - wine rarely   Dentist - has not seen - no dental coverage  Eye exam yearly Posey Pronto)  Bowel - no diarrhea/constipation  Bladder - no incontinence   Lives with daughter Santiago Glad)  Granddaughter in [place] (attorney)  Divorced Occu: retired, worked in Engineer, technical sales Edu: college     Relevant past medical, surgical, family and social history reviewed and updated as indicated. Interim medical history since our last visit reviewed. Allergies and medications reviewed and updated. Outpatient Medications Prior to Visit  Medication Sig Dispense Refill  . acetaminophen (TYLENOL) 325 MG tablet Take 1 tablet (325 mg total) by mouth every 6 (six) hours as needed for mild pain (or Fever >/= 101).    . Acetylcysteine 600 MG CAPS Take by mouth daily.    Marland Kitchen anastrozole (ARIMIDEX) 1 MG tablet Take 1 tablet (1 mg total) by mouth daily. 90 tablet 3  . aspirin EC 81 MG EC tablet Take 1 tablet (81 mg total) by mouth daily. 30 tablet 12  . Blood Glucose Monitoring Suppl (ACURA BLOOD GLUCOSE METER) W/DEVICE KIT Use  as directed to check sugars daily 250.42 1 kit 0  . CALCIUM-MAGNESIUM PO Take 1 tablet by mouth daily.    . Cholecalciferol (VITAMIN D-3) 5000 UNITS TABS Take 1 tablet by mouth daily.    . diclofenac sodium (VOLTAREN) 1 % GEL APPLY 1 APPLICATION TOPICALLY 3 (THREE) TIMES DAILY. 100 g 0  . ferrous sulfate (SLOW IRON) 160 (50 Fe) MG TBCR SR tablet Take 1 tablet (160 mg total) by mouth daily. 90 each 3  . Folic Acid-Vit H9-QQI W97 (FOLBEE) 2.5-25-1 MG TABS tablet Take 1 tablet by mouth daily.    Marland Kitchen latanoprost (XALATAN) 0.005 % ophthalmic solution Place 1 drop into both eyes at bedtime.    . Lutein 10 MG TABS Take 1 tablet (10 mg total) by  mouth daily.    . promethazine (PHENERGAN) 12.5 MG tablet Take 1 tablet (12.5 mg total) by mouth every 6 (six) hours as needed for nausea or vomiting. 20 tablet 0  . sodium bicarbonate 650 MG tablet Take 650 mg by mouth 2 (two) times daily as needed for heartburn.    . allopurinol (ZYLOPRIM) 100 MG tablet Take 1 tablet (100 mg total) by mouth daily. 90 tablet 3  . carvedilol (COREG) 25 MG tablet TAKE 1 TABLET BY MOUTH EVERY DAY 90 tablet 0  . isosorbide dinitrate (ISORDIL) 20 MG tablet TAKE 1 TABLET BY MOUTH EVERY DAY 90 tablet 2  . levothyroxine (SYNTHROID) 88 MCG tablet TAKE 1 TABLET BY MOUTH EVERY DAY 90 tablet 0  . pantoprazole (PROTONIX) 40 MG tablet TAKE 1 TABLET BY MOUTH DAILY AS NEEDED 90 tablet 0  . pentoxifylline (TRENTAL) 400 MG CR tablet TAKE 1 TABLET BY MOUTH EVERY DAY 90 tablet 1  . saxagliptin HCl (ONGLYZA) 2.5 MG TABS tablet Take 1 tablet (2.5 mg total) by mouth daily. NEEDS OFFICE VISIT 30 tablet 3   No facility-administered medications prior to visit.     Per HPI unless specifically indicated in ROS section below Review of Systems Objective:  BP (!) 166/92 (BP Location: Right Arm, Patient Position: Sitting, Cuff Size: Large)   Pulse 85   Temp 97.9 F (36.6 C) (Temporal)   Ht 5' 2.5" (1.588 m)   Wt 208 lb 9 oz (94.6 kg)   SpO2 98%   BMI 37.54 kg/m   Wt Readings from Last 3 Encounters:  06/18/20 208 lb 9 oz (94.6 kg)  04/09/19 210 lb (95.3 kg)  01/13/19 211 lb 3.2 oz (95.8 kg)      Physical Exam Vitals and nursing note reviewed.  Constitutional:      General: She is not in acute distress.    Appearance: Normal appearance. She is well-developed and well-nourished. She is not ill-appearing.     Comments: Using cane for ambulation  HENT:     Head: Normocephalic and atraumatic.     Right Ear: Hearing, tympanic membrane, ear canal and external ear normal.     Left Ear: Hearing, tympanic membrane, ear canal and external ear normal.     Mouth/Throat:     Mouth:  Oropharynx is clear and moist and mucous membranes are normal.     Pharynx: Uvula midline. No posterior oropharyngeal edema.  Eyes:     General: No scleral icterus.    Extraocular Movements: Extraocular movements intact and EOM normal.     Conjunctiva/sclera: Conjunctivae normal.     Pupils: Pupils are equal, round, and reactive to light.  Neck:     Vascular: Carotid bruit (R bruit) present.  Cardiovascular:     Rate and Rhythm: Normal rate and regular rhythm.     Pulses: Normal pulses and intact distal pulses.          Radial pulses are 2+ on the right side and 2+ on the left side.     Heart sounds: Normal heart sounds. No murmur heard.   Pulmonary:     Effort: Pulmonary effort is normal. No respiratory distress.     Breath sounds: Normal breath sounds. No wheezing, rhonchi or rales.  Abdominal:     General: Abdomen is flat. Bowel sounds are normal. There is no distension.     Palpations: Abdomen is soft. There is no mass.     Tenderness: There is no abdominal tenderness. There is no guarding or rebound.     Hernia: No hernia is present.  Musculoskeletal:        General: No edema. Normal range of motion.     Cervical back: Normal range of motion and neck supple.     Right lower leg: No edema.     Left lower leg: No edema.  Lymphadenopathy:     Cervical: No cervical adenopathy.  Skin:    General: Skin is warm and dry.     Findings: No rash.  Neurological:     General: No focal deficit present.     Mental Status: She is alert and oriented to person, place, and time.     Comments:  CN grossly intact, station and gait intact Recall 3/3  Calculation 3/5 DLORD   Psychiatric:        Mood and Affect: Mood and affect and mood normal.        Behavior: Behavior normal.        Thought Content: Thought content normal.        Judgment: Judgment normal.       Results for orders placed or performed in visit on 06/11/20  Parathyroid hormone, intact (no Ca)  Result Value Ref Range    PTH 311 (H) 14 - 64 pg/mL  Phosphorus  Result Value Ref Range   Phosphorus 3.6 2.3 - 4.6 mg/dL  VITAMIN D 25 Hydroxy (Vit-D Deficiency, Fractures)  Result Value Ref Range   VITD 69.91 30.00 - 100.00 ng/mL  CBC with Differential/Platelet  Result Value Ref Range   WBC 3.4 (L) 4.0 - 10.5 K/uL   RBC 3.48 (L) 3.87 - 5.11 Mil/uL   Hemoglobin 10.5 (L) 12.0 - 15.0 g/dL   HCT 32.9 (L) 36.0 - 46.0 %   MCV 94.4 78.0 - 100.0 fl   MCHC 32.0 30.0 - 36.0 g/dL   RDW 15.6 (H) 11.5 - 15.5 %   Platelets 102.0 (L) 150.0 - 400.0 K/uL   Neutrophils Relative % 48.5 43.0 - 77.0 %   Lymphocytes Relative 26.4 12.0 - 46.0 %   Monocytes Relative 19.2 Repeated and verified X2. (H) 3.0 - 12.0 %   Eosinophils Relative 4.9 0.0 - 5.0 %   Basophils Relative 1.0 0.0 - 3.0 %   Neutro Abs 1.6 1.4 - 7.7 K/uL   Lymphs Abs 0.9 0.7 - 4.0 K/uL   Monocytes Absolute 0.7 0.1 - 1.0 K/uL   Eosinophils Absolute 0.2 0.0 - 0.7 K/uL   Basophils Absolute 0.0 0.0 - 0.1 K/uL  Hemoglobin A1c  Result Value Ref Range   Hgb A1c MFr Bld 6.4 4.6 - 6.5 %  TSH  Result Value Ref Range   TSH 3.27 0.35 - 4.50 uIU/mL  Comprehensive metabolic  panel  Result Value Ref Range   Sodium 143 135 - 145 mEq/L   Potassium 5.0 3.5 - 5.1 mEq/L   Chloride 112 96 - 112 mEq/L   CO2 24 19 - 32 mEq/L   Glucose, Bld 147 (H) 70 - 99 mg/dL   BUN 38 (H) 6 - 23 mg/dL   Creatinine, Ser 4.59 (HH) 0.40 - 1.20 mg/dL   Total Bilirubin 0.4 0.2 - 1.2 mg/dL   Alkaline Phosphatase 77 39 - 117 U/L   AST 20 0 - 37 U/L   ALT 13 0 - 35 U/L   Total Protein 6.5 6.0 - 8.3 g/dL   Albumin 3.8 3.5 - 5.2 g/dL   GFR 8.75 (LL) >60.00 mL/min   Calcium 9.5 8.4 - 10.5 mg/dL  Lipid panel  Result Value Ref Range   Cholesterol 168 0 - 200 mg/dL   Triglycerides 80.0 0.0 - 149.0 mg/dL   HDL 52.40 >39.00 mg/dL   VLDL 16.0 0.0 - 40.0 mg/dL   LDL Cholesterol 100 (H) 0 - 99 mg/dL   Total CHOL/HDL Ratio 3    NonHDL 115.90    Assessment & Plan:  This visit occurred during the  SARS-CoV-2 public health emergency.  Safety protocols were in place, including screening questions prior to the visit, additional usage of staff PPE, and extensive cleaning of exam room while observing appropriate contact time as indicated for disinfecting solutions.   Problem List Items Addressed This Visit    Thrombocytopenia ([place])    Pancytopenia noted today. plts stable 100      Secondary hyperparathyroidism of renal origin (HCC)    Phosphorus levels normal, PTH elevated.  Not currently on phos binder.       Primary open angle glaucoma (POAG) of both eyes    R eye severe, L eye moderate - sees eye doc regularly  Continue xalatan eye drops.       Post-surgical hypothyroidism    Normal TSH -continue current regimen.       Relevant Medications   carvedilol (COREG) 25 MG tablet   levothyroxine (SYNTHROID) 88 MCG tablet   Osteoporosis    Avoid bisphosphonates in severe CKD.  Will appreciate renal input.  In interim, reviewed continue cal, vit d, weight bearing exercise.       Medicare annual wellness visit, subsequent - Primary    I have personally reviewed the Medicare Annual Wellness questionnaire and have noted 1. The patient's medical and social history 2. Their use of alcohol, tobacco or illicit drugs 3. Their current medications and supplements 4. The patient's functional ability including ADL's, fall risks, home safety risks and hearing or visual impairment. Cognitive function has been assessed and addressed as indicated.  5. Diet and physical activity 6. Evidence for depression or mood disorders The patients weight, height, BMI have been recorded in the chart. I have made referrals, counseling and provided education to the patient based on review of the above and I have provided the pt with a written personalized care plan for preventive services. Provider list updated.. See scanned questionairre as needed for further documentation. Reviewed preventative protocols and  updated unless pt declined.       Macular degeneration    Progressive vision loss. Sees eye doctor regularly.       HTN (hypertension)    BP elevated today - encouraged renal f/u.       Relevant Medications   carvedilol (COREG) 25 MG tablet   isosorbide dinitrate (ISORDIL) 20 MG tablet  History of colon cancer    H/o colon cancer 2005. Did not return iFOB last year. With comorbidities will defer screen today, encouraged f/u with gen surg.       History of breast cancer    Mammo UTD (01/2020) without evidence of recurrence.  Overdue for onc/surg f/u - encouraged call to schedule.       History of arterial ischemic stroke    Continues aspirin 36m daily. Consider adding statin in diabetic with h/o CVA. Will discuss next visit.       Relevant Medications   carvedilol (COREG) 25 MG tablet   isosorbide dinitrate (ISORDIL) 20 MG tablet   GERD (gastroesophageal reflux disease)    Managed with PRN protonix.       Relevant Medications   pantoprazole (PROTONIX) 40 MG tablet   Diabetic retinopathy with macular edema (HCC)   Relevant Medications   saxagliptin HCl (ONGLYZA) 2.5 MG TABS tablet   Diabetes mellitus type 2, uncontrolled, with complications (HCC)    Chronic, overall stable on onglyza.  Anticipate A1c falsely low due to CKD anemia. Should continue monitoring with fructosamine (prot/alb levels normal) and cbg's  Remain off statin due to comorbidities.       Relevant Medications   saxagliptin HCl (ONGLYZA) 2.5 MG TABS tablet   CKD stage 5 due to type 2 diabetes mellitus [place]    Remains off renal replacement therapy.  Upcoming renal appt 08/2020.  Labs overall stable, GFR 8% No uremic symptoms.       Relevant Medications   saxagliptin HCl (ONGLYZA) 2.5 MG TABS tablet   Carotid stenosis, asymptomatic    R carotid bruit heard today - update [place]  Mild ICA stenosis in the past.       Relevant Medications   carvedilol (COREG) 25 MG tablet   isosorbide dinitrate  (ISORDIL) 20 MG tablet   Other Relevant Orders   VAS [place]   Assistance needed with transportation    Daughter helps with transportation.       Anemia in chronic kidney disease (CKD)    Chronic. Hgb remains >10       Advanced care planning/counseling discussion    Advanced directive planning: discussed. Has this at home. HCPOA are daughters (Santiago Gladand [place] and granddaughter (Frederico Hamman. Asked to bring [place].           Meds ordered this encounter  Medications  . carvedilol (COREG) 25 MG tablet    Sig: Take 1 tablet (25 mg total) by mouth daily.    Dispense:  90 tablet    Refill:  3  . allopurinol (ZYLOPRIM) 100 MG tablet    Sig: Take 1 tablet (100 mg total) by mouth daily.    Dispense:  90 tablet    Refill:  3  . isosorbide dinitrate (ISORDIL) 20 MG tablet    Sig: Take 1 tablet (20 mg total) by mouth daily.    Dispense:  90 tablet    Refill:  3  . levothyroxine (SYNTHROID) 88 MCG tablet    Sig: Take 1 tablet (88 mcg total) by mouth daily.    Dispense:  90 tablet    Refill:  3  . pantoprazole (PROTONIX) 40 MG tablet    Sig: Take 1 tablet (40 mg total) by mouth daily as needed.    Dispense:  90 tablet    Refill:  1  . pentoxifylline (TRENTAL) 400 MG CR tablet    Sig: Take 1 tablet (400 mg total) by mouth  daily.    Dispense:  90 tablet    Refill:  3  . saxagliptin HCl (ONGLYZA) 2.5 MG TABS tablet    Sig: Take 1 tablet (2.5 mg total) by mouth daily.    Dispense:  30 tablet    Refill:  11   No orders of the defined types were placed in this encounter.   Patient instructions: You are due to see onc (Chrystal) and surgery (Byrnett) - last seen 01/2019.  Keep appointment with Dr Candiss Norse for kidneys for 08/2020.  We will order neck ultrasound to monitor carotid artery stenosis.  Bring [place] copy of your advanced directive to update your chart. Consider DNR order if that is your wish - we have a specific form we can send to your home.  Return in 6 months for diabetes  check.   Follow up plan: Return in about 6 months (around 12/17/2020) for follow up visit.  Ria Bush, MD

## 2020-06-18 NOTE — Assessment & Plan Note (Signed)
Advanced directive planning: discussed. Has this at home. HCPOA are daughters Santiago Glad and [place]) and granddaughter Frederico Hamman). Asked to bring [place] copy.

## 2020-06-19 DIAGNOSIS — N2581 Secondary hyperparathyroidism of renal origin: Secondary | ICD-10-CM | POA: Insufficient documentation

## 2020-06-19 DIAGNOSIS — H401130 Primary open-angle glaucoma, bilateral, stage unspecified: Secondary | ICD-10-CM | POA: Insufficient documentation

## 2020-06-19 NOTE — Assessment & Plan Note (Signed)
Daughter helps with transportation.

## 2020-06-19 NOTE — Assessment & Plan Note (Addendum)
Normal TSH -continue current regimen.

## 2020-06-19 NOTE — Assessment & Plan Note (Addendum)
Continues aspirin 81mg  daily. Consider adding statin in diabetic with h/o CVA. Will discuss next visit.

## 2020-06-19 NOTE — Assessment & Plan Note (Signed)
BP elevated today - encouraged renal f/u.

## 2020-06-19 NOTE — Assessment & Plan Note (Signed)
Managed with PRN protonix.

## 2020-06-19 NOTE — Assessment & Plan Note (Signed)
Pancytopenia noted today. plts stable 100

## 2020-06-19 NOTE — Assessment & Plan Note (Signed)
Avoid bisphosphonates in severe CKD.  Will appreciate renal input.  In interim, reviewed continue cal, vit d, weight bearing exercise.

## 2020-06-19 NOTE — Assessment & Plan Note (Addendum)
Remains off renal replacement therapy.  Upcoming renal appt 08/2020.  Labs overall stable, GFR 8% No uremic symptoms.

## 2020-06-19 NOTE — Assessment & Plan Note (Signed)
R eye severe, L eye moderate - sees eye doc regularly  Continue xalatan eye drops.

## 2020-06-19 NOTE — Assessment & Plan Note (Signed)
Progressive vision loss. Sees eye doctor regularly.

## 2020-06-19 NOTE — Assessment & Plan Note (Addendum)
Chronic, overall stable on onglyza.  Anticipate A1c falsely low due to CKD anemia. Should continue monitoring with fructosamine (prot/alb levels normal) and cbg's  Remain off statin due to comorbidities.

## 2020-06-19 NOTE — Assessment & Plan Note (Signed)
Phosphorus levels normal, PTH elevated.  Not currently on phos binder.

## 2020-06-19 NOTE — Assessment & Plan Note (Signed)
H/o colon cancer 2005. Did not return iFOB last year. With comorbidities will defer screen today, encouraged f/u with gen surg.

## 2020-06-19 NOTE — Assessment & Plan Note (Signed)
Mammo UTD (01/2020) without evidence of recurrence.  Overdue for onc/surg f/u - encouraged call to schedule.

## 2020-06-19 NOTE — Assessment & Plan Note (Signed)
R carotid bruit heard today - update [place].  Mild ICA stenosis in the past.

## 2020-06-19 NOTE — Assessment & Plan Note (Signed)
Chronic. Hgb remains >10

## 2020-07-26 ENCOUNTER — Ambulatory Visit (INDEPENDENT_AMBULATORY_CARE_PROVIDER_SITE_OTHER): Payer: Medicare Other

## 2020-07-26 ENCOUNTER — Other Ambulatory Visit: Payer: Self-pay

## 2020-07-26 DIAGNOSIS — I6523 Occlusion and stenosis of bilateral carotid arteries: Secondary | ICD-10-CM

## 2020-08-19 DIAGNOSIS — D631 Anemia in chronic kidney disease: Secondary | ICD-10-CM | POA: Diagnosis not present

## 2020-08-19 DIAGNOSIS — N2581 Secondary hyperparathyroidism of renal origin: Secondary | ICD-10-CM | POA: Diagnosis not present

## 2020-08-19 DIAGNOSIS — N185 Chronic kidney disease, stage 5: Secondary | ICD-10-CM | POA: Diagnosis not present

## 2020-08-19 DIAGNOSIS — I10 Essential (primary) hypertension: Secondary | ICD-10-CM | POA: Diagnosis not present

## 2020-11-23 DIAGNOSIS — Z23 Encounter for immunization: Secondary | ICD-10-CM | POA: Diagnosis not present

## 2020-12-15 ENCOUNTER — Encounter: Payer: Self-pay | Admitting: Family Medicine

## 2021-01-21 DIAGNOSIS — N185 Chronic kidney disease, stage 5: Secondary | ICD-10-CM | POA: Diagnosis not present

## 2021-01-21 DIAGNOSIS — D631 Anemia in chronic kidney disease: Secondary | ICD-10-CM | POA: Diagnosis not present

## 2021-01-21 DIAGNOSIS — R82998 Other abnormal findings in urine: Secondary | ICD-10-CM | POA: Diagnosis not present

## 2021-01-25 DIAGNOSIS — N2581 Secondary hyperparathyroidism of renal origin: Secondary | ICD-10-CM | POA: Diagnosis not present

## 2021-01-25 DIAGNOSIS — I10 Essential (primary) hypertension: Secondary | ICD-10-CM | POA: Diagnosis not present

## 2021-01-25 DIAGNOSIS — D631 Anemia in chronic kidney disease: Secondary | ICD-10-CM | POA: Diagnosis not present

## 2021-01-25 DIAGNOSIS — N185 Chronic kidney disease, stage 5: Secondary | ICD-10-CM | POA: Diagnosis not present

## 2021-01-26 ENCOUNTER — Encounter: Payer: Self-pay | Admitting: Family Medicine

## 2021-01-26 DIAGNOSIS — E1122 Type 2 diabetes mellitus with diabetic chronic kidney disease: Secondary | ICD-10-CM

## 2021-01-26 DIAGNOSIS — Z85038 Personal history of other malignant neoplasm of large intestine: Secondary | ICD-10-CM

## 2021-02-17 NOTE — Telephone Encounter (Addendum)
Previously did not return iFOB.  GI referral placed.

## 2021-02-17 NOTE — Addendum Note (Signed)
Addended by: Ria Bush on: 02/17/2021 12:27 PM   Modules accepted: Orders

## 2021-04-05 DIAGNOSIS — Z23 Encounter for immunization: Secondary | ICD-10-CM | POA: Diagnosis not present

## 2021-05-05 DIAGNOSIS — N185 Chronic kidney disease, stage 5: Secondary | ICD-10-CM | POA: Diagnosis not present

## 2021-05-05 DIAGNOSIS — D631 Anemia in chronic kidney disease: Secondary | ICD-10-CM | POA: Diagnosis not present

## 2021-05-06 DIAGNOSIS — Z23 Encounter for immunization: Secondary | ICD-10-CM | POA: Diagnosis not present

## 2021-05-16 DIAGNOSIS — D631 Anemia in chronic kidney disease: Secondary | ICD-10-CM | POA: Diagnosis not present

## 2021-05-16 DIAGNOSIS — N185 Chronic kidney disease, stage 5: Secondary | ICD-10-CM | POA: Diagnosis not present

## 2021-05-16 DIAGNOSIS — I10 Essential (primary) hypertension: Secondary | ICD-10-CM | POA: Diagnosis not present

## 2021-05-16 DIAGNOSIS — N2581 Secondary hyperparathyroidism of renal origin: Secondary | ICD-10-CM | POA: Diagnosis not present

## 2021-07-04 ENCOUNTER — Other Ambulatory Visit: Payer: Self-pay | Admitting: Family Medicine

## 2021-07-05 NOTE — Telephone Encounter (Signed)
LOV 06/18/20.  Patient is due for AWV and CPE> please schedule

## 2021-07-06 NOTE — Telephone Encounter (Signed)
Lvm for pt to call the office to schedule AWV and also sent mychart letter

## 2021-07-07 NOTE — Telephone Encounter (Signed)
2nd attempt  LMTCB to schedule

## 2021-07-11 NOTE — Telephone Encounter (Signed)
Please try to contact patient again.  Last office visit 06/18/20 If unable to reach patient please try to contact person on her DPR Then send back to [place]

## 2021-07-14 ENCOUNTER — Other Ambulatory Visit: Payer: Self-pay

## 2021-07-14 MED ORDER — CARVEDILOL 25 MG PO TABS: 25.0000 mg | ORAL_TABLET | Freq: Every day | ORAL | 3 refills | Status: DC

## 2021-07-14 MED ORDER — ISOSORBIDE DINITRATE 20 MG PO TABS: 20.0000 mg | ORAL_TABLET | Freq: Every day | ORAL | 3 refills | Status: DC

## 2021-07-14 MED ORDER — ALLOPURINOL 100 MG PO TABS
100.0000 mg | ORAL_TABLET | Freq: Every day | ORAL | 3 refills | Status: DC
Start: 2021-07-14 — End: 2022-10-03

## 2021-07-15 MED ORDER — PENTOXIFYLLINE ER 400 MG PO TBCR: 400.0000 mg | EXTENDED_RELEASE_TABLET | Freq: Every day | ORAL | 3 refills | Status: DC

## 2021-07-22 NOTE — Telephone Encounter (Signed)
3rd attempt to call pt

## 2021-08-19 DIAGNOSIS — Z20822 Contact with and (suspected) exposure to covid-19: Secondary | ICD-10-CM | POA: Diagnosis not present

## 2021-09-01 DIAGNOSIS — Z20822 Contact with and (suspected) exposure to covid-19: Secondary | ICD-10-CM | POA: Diagnosis not present

## 2021-09-13 DIAGNOSIS — H401112 Primary open-angle glaucoma, right eye, moderate stage: Secondary | ICD-10-CM | POA: Diagnosis not present

## 2021-09-13 DIAGNOSIS — H3582 Retinal ischemia: Secondary | ICD-10-CM | POA: Diagnosis not present

## 2021-09-13 DIAGNOSIS — H401123 Primary open-angle glaucoma, left eye, severe stage: Secondary | ICD-10-CM | POA: Diagnosis not present

## 2021-09-13 DIAGNOSIS — Z961 Presence of intraocular lens: Secondary | ICD-10-CM | POA: Diagnosis not present

## 2021-09-13 DIAGNOSIS — E113413 Type 2 diabetes mellitus with severe nonproliferative diabetic retinopathy with macular edema, bilateral: Secondary | ICD-10-CM | POA: Diagnosis not present

## 2021-09-13 DIAGNOSIS — H31093 Other chorioretinal scars, bilateral: Secondary | ICD-10-CM | POA: Diagnosis not present

## 2021-09-15 DIAGNOSIS — I10 Essential (primary) hypertension: Secondary | ICD-10-CM | POA: Diagnosis not present

## 2021-09-15 DIAGNOSIS — N185 Chronic kidney disease, stage 5: Secondary | ICD-10-CM | POA: Diagnosis not present

## 2021-09-15 DIAGNOSIS — E1122 Type 2 diabetes mellitus with diabetic chronic kidney disease: Secondary | ICD-10-CM | POA: Diagnosis not present

## 2021-09-15 DIAGNOSIS — N2581 Secondary hyperparathyroidism of renal origin: Secondary | ICD-10-CM | POA: Diagnosis not present

## 2021-09-15 DIAGNOSIS — D631 Anemia in chronic kidney disease: Secondary | ICD-10-CM | POA: Diagnosis not present

## 2021-09-19 ENCOUNTER — Telehealth: Payer: Self-pay

## 2021-09-19 ENCOUNTER — Other Ambulatory Visit: Payer: Self-pay | Admitting: Surgery

## 2021-09-19 ENCOUNTER — Ambulatory Visit (INDEPENDENT_AMBULATORY_CARE_PROVIDER_SITE_OTHER): Payer: Medicare Other | Admitting: Family Medicine

## 2021-09-19 ENCOUNTER — Encounter: Payer: Self-pay | Admitting: Family Medicine

## 2021-09-19 ENCOUNTER — Other Ambulatory Visit: Payer: Self-pay

## 2021-09-19 VITALS — BP 166/82 | HR 78 | Temp 97.5°F | Ht 62.5 in | Wt 185.4 lb

## 2021-09-19 DIAGNOSIS — Z1231 Encounter for screening mammogram for malignant neoplasm of breast: Secondary | ICD-10-CM

## 2021-09-19 DIAGNOSIS — H3530 Unspecified macular degeneration: Secondary | ICD-10-CM | POA: Diagnosis not present

## 2021-09-19 DIAGNOSIS — R634 Abnormal weight loss: Secondary | ICD-10-CM | POA: Insufficient documentation

## 2021-09-19 DIAGNOSIS — E890 Postprocedural hypothyroidism: Secondary | ICD-10-CM | POA: Diagnosis not present

## 2021-09-19 DIAGNOSIS — E11311 Type 2 diabetes mellitus with unspecified diabetic retinopathy with macular edema: Secondary | ICD-10-CM | POA: Diagnosis not present

## 2021-09-19 DIAGNOSIS — Z85038 Personal history of other malignant neoplasm of large intestine: Secondary | ICD-10-CM

## 2021-09-19 DIAGNOSIS — Z8673 Personal history of transient ischemic attack (TIA), and cerebral infarction without residual deficits: Secondary | ICD-10-CM

## 2021-09-19 DIAGNOSIS — E1169 Type 2 diabetes mellitus with other specified complication: Secondary | ICD-10-CM

## 2021-09-19 DIAGNOSIS — I10 Essential (primary) hypertension: Secondary | ICD-10-CM

## 2021-09-19 DIAGNOSIS — N2581 Secondary hyperparathyroidism of renal origin: Secondary | ICD-10-CM

## 2021-09-19 DIAGNOSIS — D696 Thrombocytopenia, unspecified: Secondary | ICD-10-CM

## 2021-09-19 DIAGNOSIS — H401132 Primary open-angle glaucoma, bilateral, moderate stage: Secondary | ICD-10-CM

## 2021-09-19 DIAGNOSIS — E1122 Type 2 diabetes mellitus with diabetic chronic kidney disease: Secondary | ICD-10-CM | POA: Diagnosis not present

## 2021-09-19 DIAGNOSIS — Z853 Personal history of malignant neoplasm of breast: Secondary | ICD-10-CM

## 2021-09-19 DIAGNOSIS — K219 Gastro-esophageal reflux disease without esophagitis: Secondary | ICD-10-CM

## 2021-09-19 DIAGNOSIS — D631 Anemia in chronic kidney disease: Secondary | ICD-10-CM

## 2021-09-19 DIAGNOSIS — I480 Paroxysmal atrial fibrillation: Secondary | ICD-10-CM | POA: Diagnosis not present

## 2021-09-19 DIAGNOSIS — N185 Chronic kidney disease, stage 5: Secondary | ICD-10-CM

## 2021-09-19 LAB — POCT GLYCOSYLATED HEMOGLOBIN (HGB A1C): Hemoglobin A1C: 5.7 % — AB (ref 4.0–5.6)

## 2021-09-19 LAB — TSH: TSH: 4.45 u[IU]/mL (ref 0.35–5.50)

## 2021-09-19 MED ORDER — ONGLYZA 2.5 MG PO TABS: 2.5000 mg | ORAL_TABLET | Freq: Every day | ORAL | 11 refills | Status: DC

## 2021-09-19 MED ORDER — ANASTROZOLE 1 MG PO TABS: 1.0000 mg | ORAL_TABLET | Freq: Every day | ORAL | 1 refills | Status: DC

## 2021-09-19 MED ORDER — PANTOPRAZOLE SODIUM 40 MG PO TBEC: 40.0000 mg | DELAYED_RELEASE_TABLET | Freq: Every day | ORAL | 1 refills | Status: DC | PRN

## 2021-09-19 MED ORDER — LEVOTHYROXINE SODIUM 88 MCG PO TABS: 88.0000 ug | ORAL_TABLET | Freq: Every day | ORAL | 3 refills | Status: DC

## 2021-09-19 NOTE — Assessment & Plan Note (Signed)
H/o colon cancer 2005 s/p partial colectomy. No details available. She has not turned in prior iFOB stool kits. See above. Sent home with another one today, will refer to GI.  ?

## 2021-09-19 NOTE — Assessment & Plan Note (Signed)
Phosphorus levels remain normal. Continue vitamin D daily.  ?

## 2021-09-19 NOTE — Assessment & Plan Note (Addendum)
Progressive vision loss (Dr Posey Pronto at [facility]), to restart eye injections shortly.  ?Filled out handicap placard for patient.  ?

## 2021-09-19 NOTE — Progress Notes (Signed)
? ? Patient ID: Alexis Tucker, female    DOB: [birth year], 81 y.o.   MRN: [id number] ? ?This visit was conducted in person. ? ?BP (!) 166/82 (BP Location: Right Arm, Cuff Size: Normal)   Pulse 78   Temp (!) 97.5 ?F (36.4 ?C) (Temporal)   Ht 5' 2.5" (1.588 m)   Wt 185 lb 6 oz (84.1 kg)   SpO2 100%   BMI 33.37 kg/m?   ?BP Readings from Last 3 Encounters:  ?09/19/21 (!) 166/82  ?06/18/20 (!) 166/92  ?04/09/19 125/60  ? ?CC: discuss GI referral ?Subjective:  ? ?HPI: ?Alexis Tucker is a 81 y.o. female presenting on 09/19/2021 for Referral (Requests GI referral for colonoscopy.  Pt accompanied by daughter, Santiago Glad. ) ? ? ?Last seen 06/2020.  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 BP chronically runs elevated. Kidney function remarkably stable the last several years. Latest Cr 4.48, eGFR 10, Na 138, K 4.4, CO2 19, glucose 99, PTH 306, WBC 6.1, Hgb 10.4, MCV 95, plt 123 (08/2021). She also continues oral iron replacement (Slow FE).  ? ?She is currently taking carvedilol 41m once daily, isosorbide dinitrate 210mdaily. Was prescribed amlodipine 80m107maily but not currently taking. She did not take BP meds yet today.  ? ?COLONOSCOPY Date: 2008 ext hem, ileo-colonic anastomosis in cecum, tubular adenoma x1, rec rpt 1 yr for multiple polyps (done in [place]) - not completed. H/o colon cancer 2005 s/p colectomy. Has not return last few iFOBs.  ? ?She is here to discuss possible need for repeat colonoscopy.  ?Denies blood in stool, bowel changes.  ?Notes unexpected weight loss over the past year - 25 lbs.  ?Denies blood in stool or black tarry stools.  ?Notes occasional diarrhea 1x/mo - formed but loose.  ?Daughter states she noted bright red blood  ? ?H/o breast cancer s/p treatment 2020 ([facility]). She continues arimidex yearly. Has not recently see onc. Upcoming mammogram at end of this month.  ? ?DM - continues onglyza 2.80mg71mily. Not checking sugars at home.  ?Notes progressive  vision loss. [facility] per pt. Worsening swelling - to resume eye injections.  ?   ? ?Relevant past medical, surgical, family and social history reviewed and updated as indicated. Interim medical history since our last visit reviewed. ?Allergies and medications reviewed and updated. ?Outpatient Medications Prior to Visit  ?Medication Sig Dispense Refill  ? acetaminophen (TYLENOL) 325 MG tablet Take 1 tablet (325 mg total) by mouth every 6 (six) hours as needed for mild pain (or Fever >/= 101).    ? Acetylcysteine 600 MG CAPS Take by mouth daily.    ? allopurinol (ZYLOPRIM) 100 MG tablet Take 1 tablet (100 mg total) by mouth daily. 90 tablet 3  ? aspirin EC 81 MG EC tablet Take 1 tablet (81 mg total) by mouth daily. 30 tablet 12  ? Blood Glucose Monitoring Suppl ([facility]URA BLOOD GLUCOSE METER) W/DEVICE KIT Use as directed to check sugars daily 250.42 1 kit 0  ? CALCIUM-MAGNESIUM PO Take 1 tablet by mouth daily.    ? carvedilol (COREG) 25 MG tablet Take 1 tablet (25 mg total) by mouth daily. 90 tablet 3  ? Cholecalciferol (VITAMIN D-3) 5000 UNITS TABS Take 1 tablet by mouth daily.    ? diclofenac sodium (VOLTAREN) 1 % GEL APPLY 1 APPLICATION TOPICALLY 3 (THREE) TIMES DAILY. 100 g 0  ?  ferrous sulfate (SLOW IRON) 160 (50 Fe) MG TBCR SR tablet Take 1 tablet (160 mg total) by mouth daily. 90 each 3  ? Folic Acid-Vit N2-DPO E42 (FOLBEE) 2.5-25-1 MG TABS tablet Take 1 tablet by mouth daily.    ? isosorbide dinitrate (ISORDIL) 20 MG tablet Take 1 tablet (20 mg total) by mouth daily. 90 tablet 3  ? Netarsudil Dimesylate (RHOPRESSA) 0.02 % SOLN Apply to eye once a week.    ? pentoxifylline (TRENTAL) 400 MG CR tablet Take 1 tablet (400 mg total) by mouth daily. 90 tablet 3  ? sodium bicarbonate 650 MG tablet Take 650 mg by mouth 2 (two) times daily as needed for heartburn.    ? anastrozole (ARIMIDEX) 1 MG tablet Take 1 tablet (1 mg total) by mouth daily.  90 tablet 3  ? levothyroxine (SYNTHROID) 88 MCG tablet Take 1 tablet (88 mcg total) by mouth daily. 90 tablet 3  ? pantoprazole (PROTONIX) 40 MG tablet Take 1 tablet (40 mg total) by mouth daily as needed. 90 tablet 1  ? promethazine (PHENERGAN) 12.5 MG tablet Take 1 tablet (12.5 mg total) by mouth every 6 (six) hours as needed for nausea or vomiting. 20 tablet 0  ? saxagliptin HCl (ONGLYZA) 2.5 MG TABS tablet Take 1 tablet (2.5 mg total) by mouth daily. 30 tablet 11  ? latanoprost (XALATAN) 0.005 % ophthalmic solution Place 1 drop into both eyes at bedtime.    ? Lutein 10 MG TABS Take 1 tablet (10 mg total) by mouth daily.    ? ?No facility-administered medications prior to visit.  ?  ? ?Per HPI unless specifically indicated in ROS section below ?Review of Systems ? ?Objective:  ?BP (!) 166/82 (BP Location: Right Arm, Cuff Size: Normal)   Pulse 78   Temp (!) 97.5 ?F (36.4 ?C) (Temporal)   Ht 5' 2.5" (1.588 m)   Wt 185 lb 6 oz (84.1 kg)   SpO2 100%   BMI 33.37 kg/m?   ?Wt Readings from Last 3 Encounters:  ?09/19/21 185 lb 6 oz (84.1 kg)  ?06/18/20 208 lb 9 oz (94.6 kg)  ?04/09/19 210 lb (95.3 kg)  ?  ?  ?Physical Exam ?Vitals and nursing note reviewed.  ?Constitutional:   ?   Appearance: Normal appearance. She is not ill-appearing.  ?   Comments: Ambulates with cane   ?Cardiovascular:  ?   Rate and Rhythm: Normal rate and regular rhythm.  ?   Pulses: Normal pulses.  ?   Heart sounds: Normal heart sounds. No murmur heard. ?Pulmonary:  ?   Effort: Pulmonary effort is normal. No respiratory distress.  ?   Breath sounds: Normal breath sounds. No wheezing, rhonchi or rales.  ?Abdominal:  ?   General: Bowel sounds are normal. There is no distension.  ?   Palpations: Abdomen is soft. There is no mass.  ?   Tenderness: There is no abdominal tenderness. There is no guarding or rebound.  ?   Hernia: No hernia is present.  ?Musculoskeletal:  ?   Right lower leg: No edema.  ?   Left lower leg: No edema.  ?Skin: ?    General: Skin is warm and dry.  ?   Findings: No rash.  ?Neurological:  ?   Mental Status: She is alert.  ? ?   ?Results for orders placed or performed in visit on 09/19/21  ?POCT glycosylated hemoglobin (Hb A1C)  ?Result Value Ref Range  ? Hemoglobin A1C 5.7 (A) 4.0 -  5.6 %  ? HbA1c POC (<> result, manual entry)    ? HbA1c, POC (prediabetic range)    ? HbA1c, POC (controlled diabetic range)    ? ? ?Assessment & Plan:  ?This visit occurred during the SARS-CoV-2 public health emergency.  Safety protocols were in place, including screening questions prior to the visit, additional usage of staff PPE, and extensive cleaning of exam room while observing appropriate contact time as indicated for disinfecting solutions.  ? ?Problem List Items Addressed This Visit   ? ? Post-surgical hypothyroidism  ?  Update TSH on levothyroxine 31mg daily.  ?  ?  ? Relevant Medications  ? levothyroxine (SYNTHROID) 88 MCG tablet  ? Other Relevant Orders  ? TSH  ? Type 2 diabetes mellitus with other specified complication [place]  ?  Update A1c - 5.7% on daily onglyza - she ran out a few days ago. Will refill.  ?  ?  ? Relevant Medications  ? saxagliptin HCl (ONGLYZA) 2.5 MG TABS tablet  ? Other Relevant Orders  ? POCT glycosylated hemoglobin (Hb A1C) (Completed)  ? HTN (hypertension)  ?  Chronic, above goal. Has not yet taking BP meds today.  ?She is regularly taking carvedilol 22monce daily and isosorbide dinitrate 2030maily, although time of administration fluctuates daily. Advised try to take as early in day as able for optimal effect (she wakes up after 11am most days).  ?  ?  ? History of breast cancer  ?  Upcoming mammo at end of the month.  ?Established with Dr PisHampton Abbotneral surgery.  ?  ?  ? History of colon cancer  ?  H/o colon cancer 2005 s/p partial colectomy. No details available. She has not turned in prior iFOB stool kits. See above. Sent home with another one today, will refer to GI.  ?  ?  ? Relevant Orders  ?  Ambulatory referral to Gastroenterology  ? Fecal occult blood, imunochemical  ? GERD (gastroesophageal reflux disease)  ?  She states she is regularly taking pantoprazole - refilled today.  ?  ?  ? Relevant Medications  ? pantop

## 2021-09-19 NOTE — Assessment & Plan Note (Signed)
Continue daily aspirin. Not on statin.  ?

## 2021-09-19 NOTE — Assessment & Plan Note (Signed)
Severe, seeing ophtho.  ?

## 2021-09-19 NOTE — Assessment & Plan Note (Addendum)
She endorses concerning almost 25 lb unintentional weight loss in the past 1+ year, and daughter mentions seeing [facility] when cleaning once several months ago. Denies h/o hemorrhoids. In h/o colon cancer, did recommend discussion with gastroenterology for further evaluation. Complicating situation is her chronic thrombocytopenia and CKD stage 5 and concomitant anemia.  ?I also asked her to submit iFOB.  ?

## 2021-09-19 NOTE — Assessment & Plan Note (Signed)
Update TSH on levothyroxine 16mg daily.  ?

## 2021-09-19 NOTE — Telephone Encounter (Signed)
CALLED PATIENT NO ANSWER LEFT VOICEMAIL FOR A CALL BACK ?Called daughter new patient any ?

## 2021-09-19 NOTE — Assessment & Plan Note (Signed)
She states she is regularly taking pantoprazole - refilled today.  ?

## 2021-09-19 NOTE — Assessment & Plan Note (Signed)
Appreciate renal care Alexis Tucker).  ?

## 2021-09-19 NOTE — Assessment & Plan Note (Addendum)
Upcoming mammo at end of the month.  ?Established with Dr Hampton Abbot general surgery.  ?

## 2021-09-19 NOTE — Assessment & Plan Note (Addendum)
Chronic, overall stable, presumed related to CKD.  ?Check iFOB today.  ?

## 2021-09-19 NOTE — Assessment & Plan Note (Signed)
Update A1c - 5.7% on daily onglyza - she ran out a few days ago. Will refill.  ?

## 2021-09-19 NOTE — Patient Instructions (Addendum)
Try taking blood pressure medicines when you wake up for the day.  ?A1c checked today - continue onglyza 2.5mg  daily.  ?Pass by lab for a stool kit today.  ?Labs today to check thyroid.  ?We will refer you to GI to discuss colonoscopy.  ?We will provide you with DMV application for handicap placard.  ?

## 2021-09-19 NOTE — Assessment & Plan Note (Signed)
Seems chronic.  ?

## 2021-09-19 NOTE — Assessment & Plan Note (Signed)
Sounds regular today.  ?

## 2021-09-19 NOTE — Assessment & Plan Note (Addendum)
Chronic, above goal. Has not yet taking BP meds today.  ?She is regularly taking carvedilol '25mg'$  once daily and isosorbide dinitrate '20mg'$  daily, although time of administration fluctuates daily. Advised try to take as early in day as able for optimal effect (she wakes up after 11am most days).  ?

## 2021-09-20 ENCOUNTER — Telehealth: Payer: Self-pay

## 2021-09-20 NOTE — Telephone Encounter (Signed)
Scheduled for 01/09/2022 ?

## 2021-09-21 DIAGNOSIS — E113312 Type 2 diabetes mellitus with moderate nonproliferative diabetic retinopathy with macular edema, left eye: Secondary | ICD-10-CM | POA: Diagnosis not present

## 2021-09-23 DIAGNOSIS — Z20822 Contact with and (suspected) exposure to covid-19: Secondary | ICD-10-CM | POA: Diagnosis not present

## 2021-09-28 ENCOUNTER — Ambulatory Visit
Admission: RE | Admit: 2021-09-28 | Discharge: 2021-09-28 | Disposition: A | Discharge status: Home / Self Care | Payer: Medicare Other | Source: Ambulatory Visit | Attending: Surgery | Admitting: Surgery

## 2021-09-28 ENCOUNTER — Other Ambulatory Visit: Payer: Self-pay

## 2021-09-28 DIAGNOSIS — Z1231 Encounter for screening mammogram for malignant neoplasm of breast: Secondary | ICD-10-CM | POA: Diagnosis not present

## 2021-10-03 ENCOUNTER — Other Ambulatory Visit (INDEPENDENT_AMBULATORY_CARE_PROVIDER_SITE_OTHER): Payer: Medicare Other

## 2021-10-03 DIAGNOSIS — R634 Abnormal weight loss: Secondary | ICD-10-CM | POA: Diagnosis not present

## 2021-10-03 DIAGNOSIS — Z85038 Personal history of other malignant neoplasm of large intestine: Secondary | ICD-10-CM

## 2021-10-04 ENCOUNTER — Telehealth: Payer: Self-pay

## 2021-10-04 LAB — FECAL OCCULT BLOOD, IMMUNOCHEMICAL: Fecal Occult Bld: POSITIVE — AB

## 2021-10-04 NOTE — Telephone Encounter (Signed)
[facility] called in critical result. ?Positive IFOB. ?Given to Dr Danise Mina ?

## 2021-10-04 NOTE — Telephone Encounter (Signed)
Plz notify pt stool test returned positive for blood - will see if we can expedite appt with GI scheduled for 12/2021.  ?Ashtyn, should I place a new stat referral to try and expedite? Ideally would like seen within the next month. Thanks.  ?

## 2021-10-04 NOTE — Telephone Encounter (Signed)
Left message on voicemail for patient to call the office back. 

## 2021-10-04 NOTE — Telephone Encounter (Signed)
Will reach out to [facility] to see if able to get patient in sooner given positive test results.  ? ? ?Ginger,  ?Please advise if able to work patient in sooner. Thanks! ?

## 2021-10-05 NOTE — Telephone Encounter (Signed)
Wonderful thank you.

## 2021-10-06 ENCOUNTER — Other Ambulatory Visit: Payer: Self-pay

## 2021-10-06 ENCOUNTER — Ambulatory Visit (INDEPENDENT_AMBULATORY_CARE_PROVIDER_SITE_OTHER): Payer: Medicare Other | Admitting: Gastroenterology

## 2021-10-06 ENCOUNTER — Encounter: Payer: Self-pay | Admitting: Gastroenterology

## 2021-10-06 VITALS — BP 198/81 | HR 75 | Temp 98.7°F | Ht 62.0 in | Wt 187.0 lb

## 2021-10-06 DIAGNOSIS — R195 Other fecal abnormalities: Secondary | ICD-10-CM

## 2021-10-06 MED ORDER — NA SULFATE-K SULFATE-MG SULF 17.5-3.13-1.6 GM/177ML PO SOLN: 1.0000 | Freq: Once | ORAL | 0 refills | Status: AC

## 2021-10-06 NOTE — Progress Notes (Signed)
?  ?Jonathon Bellows MD, MRCP(U.K) ?[address]  ?[address]  ?[place], [place] [address]  ?Main: [phone]  ?Fax: [phone] ? ? ?Gastroenterology Consultation ? ?Referring Provider:     Ria Bush, MD ?Primary Care Physician:  Ria Bush, MD ?Primary Gastroenterologist:  Dr. Jonathon Bellows  ?Reason for Consultation:   Unintentional weight loss ?      ? HPI:   ?Alexis Tucker is a 81 y.o. y/o female referred for consultation & management  by Dr. Ria Bush, MD.   ? ? ?She has been referred for unintentional weight loss. She says she lost about 3 to 4 pounds since November nothing more.  Denies any overt blood loss in her stool but her stool test came back positive for blood.  No colonoscopy since 2005 at least.  No change in bowel habits no blood in the stool no change in shape of stool. She has a history of colon cancer in 2005 s/p partial colectomy she has been referred to GI for the same. ?No recent imaging TSH normal HbA1c 5.7 ?09/22/2021 hemoglobin 10.4 g MCV of 95.  Creatinine 4.48. ?Past Medical History:  ?Diagnosis Date  ? Anemia   ? per prior pcp  ? Breast cancer ([place]) 2008  ? F/U radiation   ? Breast cancer ([place]) 11/27/2017  ? mpT2 pN0 ER/PR positive, HER-2/neu negative.  2.5 cm maximum diameter, multifocal.  MammoSite  ? Carotid stenosis, asymptomatic 01/2014  ? mild ICA, severe in ECA, rec rpt [place] 2 years  ? Cataracts, bilateral   ? 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 Cr 3.85, in 3s since 2010, discussing HD and L forearm graft ([place])  ? Diabetes type 2, controlled ([place]) 2000s  ? Diabetic retinopathy with macular edema ([place]) 03/2014  ? [place] at [facility] eye on avastin  ? DJD (degenerative joint disease)   ? per prior pcp  ? GERD (gastroesophageal reflux disease)   ? Glaucoma 03/2014  ? History of breast cancer 2008  ? infiltrating ductal carcinoma s/p mammosite  ? History of colon cancer 2006  ? s/p colectomy  ? History of hepatitis A 1990s  ?  from food, resolved  ? HTN (hypertension)   ? Macular degeneration   ? wet R with vision loss, dry L; to see retina specialist  ? Neuropathy   ? per prior pcp  ? OA (osteoarthritis)   ? knees and hips  ? Osteonecrosis ([place])   ? hips?  ? Personal history of radiation therapy 2008  ? F/U Left Breast Cancer  ? Personal history of radiation therapy 2019  ? mammosite left breast ca  ? PONV (postoperative nausea and vomiting)   ? Post-surgical hypothyroidism   ? Wears dentures   ? partial upper  ? ? ?Past Surgical History:  ?Procedure Laterality Date  ? ABDOMINAL HYSTERECTOMY  1976  ? partial, after tubal pregnancy, h/o fibroids with heavy bleeding  ? BREAST BIOPSY Left 2008  ? positive  ? BREAST BIOPSY Left 11/22/2017  ? 2oclock 2cmfn wing shaped clip, INVASIVE MAMMARY CARCINOMA  ? BREAST BIOPSY Left 11/22/2017  ? 2oclock 4cmfn coil shaped clip, INVASIVE MAMMARY CARCINOMA  ? BREAST BIOPSY Left 11/22/2017  ? lymph node - butterfly hydroMARK  ? BREAST EXCISIONAL BIOPSY Left 01/07/2018  ? lumpectomy by Dr. Bary Castilla  ? BREAST LUMPECTOMY Left 2008  ? F/U radiation  ? BREAST LUMPECTOMY Left 2019  ? 2 areas of [place] at 2:00 position f/u with mammosite  ?  BREAST LUMPECTOMY WITH SENTINEL LYMPH NODE BIOPSY Left 01/07/2018  ? Procedure: BREAST LUMPECTOMY WITH SENTINEL LYMPH NODE BX;  Surgeon: Robert Bellow, MD;  Location: [facility] ORS;  Service: General;  Laterality: Left;  ? BREAST MAMMOSITE  2008  ? carotid ultrasound  12/2012  ? 1-39% bilateral stenosis, severe in ECA  ? CATARACT EXTRACTION W/PHACO Left 08/14/2016  ? Procedure: CATARACT EXTRACTION PHACO AND INTRAOCULAR LENS PLACEMENT (IOC)  Left Diabetic;  Surgeon: Ronnell Freshwater, MD;  Location: [facility];  Service: Ophthalmology;  Laterality: Left;  Diabetic - oral meds  ? CHOLECYSTECTOMY  2005  ? COLECTOMY  2005  ? colon cancer  ? COLON SURGERY  2006  ? bowel obstruction  ? COLONOSCOPY  2008  ? ext hem, ileo-colonic anastomosis in cecum, tubular adenoma x1, rec  rpt 1 yr for multiple polyps (in [place])  ? ESOPHAGOGASTRODUODENOSCOPY N/A 05/06/2016  ? Procedure: ESOPHAGOGASTRODUODENOSCOPY (EGD);  Surgeon: Manya Silvas, MD;  Location: [facility] ENDOSCOPY;  Service: Endoscopy;  Laterality: N/A;  ? THYROIDECTOMY, PARTIAL    ? unclear why  ? TOTAL KNEE ARTHROPLASTY Right 1999  ? US ECHOCARDIOGRAPHY  11/2007  ? nl LV fxn EF 52%, diastolic dysfunction, LVH, tr TR and MR, slcerotic aortic valve  ? ? ?Prior to Admission medications   ?Medication Sig Start Date End Date Taking? Authorizing Provider  ?acetaminophen (TYLENOL) 325 MG tablet Take 1 tablet (325 mg total) by mouth every 6 (six) hours as needed for mild pain (or Fever >/= 101). 08/27/17   Ria Bush, MD  ?Acetylcysteine 600 MG CAPS Take by mouth daily.    [provider]  ?allopurinol (ZYLOPRIM) 100 MG tablet Take 1 tablet (100 mg total) by mouth daily. 07/14/21   Ria Bush, MD  ?amLODipine (NORVASC) 5 MG tablet Take 5 mg by mouth daily. 09/15/21   [provider]  ?anastrozole (ARIMIDEX) 1 MG tablet Take 1 tablet (1 mg total) by mouth daily. 09/19/21   Ria Bush, MD  ?aspirin EC 81 MG EC tablet Take 1 tablet (81 mg total) by mouth daily. 05/09/16   Gladstone Lighter, MD  ?Blood Glucose Monitoring Suppl [facility] BLOOD GLUCOSE METER) W/DEVICE KIT Use as directed to check sugars daily 250.42 03/13/14   Ria Bush, MD  ?CALCIUM-MAGNESIUM PO Take 1 tablet by mouth daily.    [provider]  ?carvedilol (COREG) 25 MG tablet Take 1 tablet (25 mg total) by mouth daily. 07/14/21   Ria Bush, MD  ?Cholecalciferol (VITAMIN D-3) 5000 UNITS TABS Take 1 tablet by mouth daily.    [provider]  ?diclofenac sodium (VOLTAREN) 1 % GEL APPLY 1 APPLICATION TOPICALLY 3 (THREE) TIMES DAILY. 10/30/17   Ria Bush, MD  ?ferrous sulfate (SLOW IRON) 160 (50 Fe) MG TBCR SR tablet Take 1 tablet (160 mg total) by mouth daily. 11/07/16   Ria Bush, MD  ?Folic Acid-Vit W4-XLK G40  (FOLBEE) 2.5-25-1 MG TABS tablet Take 1 tablet by mouth daily.    [provider]  ?isosorbide dinitrate (ISORDIL) 20 MG tablet Take 1 tablet (20 mg total) by mouth daily. 07/14/21   Ria Bush, MD  ?levothyroxine (SYNTHROID) 88 MCG tablet Take 1 tablet (88 mcg total) by mouth daily. 09/19/21   Ria Bush, MD  ?Netarsudil Dimesylate (RHOPRESSA) 0.02 % SOLN Apply to eye once a week.    [provider]  ?pantoprazole (PROTONIX) 40 MG tablet Take 1 tablet (40 mg total) by mouth daily as needed. 09/19/21   Ria Bush, MD  ?pentoxifylline (  TRENTAL) 400 MG CR tablet Take 1 tablet (400 mg total) by mouth daily. 07/15/21   Ria Bush, MD  ?saxagliptin HCl (ONGLYZA) 2.5 MG TABS tablet Take 1 tablet (2.5 mg total) by mouth daily. 09/19/21   Ria Bush, MD  ?sodium bicarbonate 650 MG tablet Take 650 mg by mouth 2 (two) times daily as needed for heartburn.    [provider]  ? ? ?Family History  ?Problem Relation Age of Onset  ? Diabetes Mother   ? Hypertension Mother   ? Arthritis Mother   ? Alcohol abuse Father   ? Arthritis Father   ? Stroke Daughter   ? CAD Neg Hx   ? Cancer Neg Hx   ? Breast cancer Neg Hx   ?  ? ?Social History  ? ?Tobacco Use  ? Smoking status: Former  ? Smokeless tobacco: Never  ? Tobacco comments:  ?  quit over 40 yrs ago  ?Vaping Use  ? Vaping Use: Never used  ?Substance Use Topics  ? Alcohol use: Never  ?  Comment: rare  ? Drug use: Never  ? ? ?Allergies as of 10/06/2021 - Review Complete 10/06/2021  ?Allergen Reaction Noted  ? Tramadol Other (See Comments) 12/05/2012  ? Codeine Anxiety 12/05/2012  ? Sulfa antibiotics Itching and Rash 12/05/2012  ? ? ?Review of Systems:    ?All systems reviewed and negative except where noted in HPI. ? ? Physical Exam:  ?BP (!) 198/81 (Cuff Size: Normal)   Pulse 75   Temp 98.7 ?F (37.1 ?C) (Oral)   Ht $R'5\' 2"'oD$  (1.575 m)   Wt 187 lb (84.8 kg)   BMI 34.20 kg/m?  ?No LMP recorded. Patient has had a  hysterectomy. ?Psych:  Alert and cooperative. Normal mood and affect. ?General:   Alert,  Well-developed, well-nourished, pleasant and cooperative in NAD ?Head:  Normocephalic and atraumatic. ?Eyes:  Sclera clear, no ict

## 2021-10-07 ENCOUNTER — Encounter: Payer: Self-pay | Admitting: Gastroenterology

## 2021-10-10 ENCOUNTER — Encounter: Payer: Self-pay | Admitting: Registered Nurse

## 2021-10-10 ENCOUNTER — Ambulatory Visit
Admission: RE | Admit: 2021-10-10 | Discharge: 2021-10-10 | Disposition: A | Discharge status: Home / Self Care | Payer: Medicare Other | Attending: Gastroenterology | Admitting: Gastroenterology

## 2021-10-10 ENCOUNTER — Telehealth: Payer: Self-pay | Admitting: Gastroenterology

## 2021-10-10 ENCOUNTER — Encounter
Admission: RE | Disposition: A | Discharge status: Home / Self Care | Payer: Self-pay | Source: Home / Self Care | Attending: Gastroenterology

## 2021-10-10 DIAGNOSIS — R195 Other fecal abnormalities: Secondary | ICD-10-CM | POA: Insufficient documentation

## 2021-10-10 DIAGNOSIS — Z538 Procedure and treatment not carried out for other reasons: Secondary | ICD-10-CM | POA: Diagnosis not present

## 2021-10-10 SURGERY — COLONOSCOPY WITH PROPOFOL
Anesthesia: General

## 2021-10-10 MED ORDER — SODIUM CHLORIDE 0.9 % IV SOLN: INTRAVENOUS | Status: DC

## 2021-10-10 NOTE — OR Nursing (Signed)
Patient threw up the entire first half of her prep.  Completed the second half but stools were still brown. Dr Vicente Males will call to reschedule ?

## 2021-10-10 NOTE — Telephone Encounter (Signed)
Patients daughter called. Patient was supposed tyo have colonoscopy today. Daughter stated the suprep mader the patient sick and she threw it up. Daughter requesting call back asap. ?

## 2021-10-10 NOTE — Anesthesia Preprocedure Evaluation (Deleted)
Anesthesia Evaluation  ? ? ?History of Anesthesia Complications ?(+) PONV and history of anesthetic complications ? ?Airway ? ? ? ? ? ? ? Dental ?  ?Pulmonary ?former smoker,  ?  ? ? ? ? ? ? ? Cardiovascular ?hypertension, + Peripheral Vascular Disease (carotid stenosis)  ? ? ? ?  ?Neuro/Psych ?Diabetic retinopathy ?  ? GI/Hepatic ?Colon CA ?  ?Endo/Other  ?diabetes, Type 2Hypothyroidism Obesity  ? Renal/GU ?Renal disease (stage V CKD)  ? ?  ?Musculoskeletal ? ?(+) Arthritis ,  ? Abdominal ?  ?Peds ? Hematology ? ?(+) Blood dyscrasia, anemia , Breast CA   ?Anesthesia Other Findings ? ? Reproductive/Obstetrics ? ?  ? ? ? ? ? ? ? ? ? ? ? ? ? ?  ?  ? ? ? ? ? ? ? ? ?Anesthesia Physical ?Anesthesia Plan ? ?ASA: 3 ? ?Anesthesia Plan: General  ? ?Post-op Pain Management:   ? ?Induction: Intravenous ? ?PONV Risk Score and Plan: 4 or greater and Propofol infusion, TIVA and Treatment may vary due to age or medical condition ? ?Airway Management Planned: Natural Airway ? ?Additional Equipment:  ? ?Intra-op Plan:  ? ?Post-operative Plan:  ? ?Informed Consent: I have reviewed the patients History and Physical, chart, labs and discussed the procedure including the risks, benefits and alternatives for the proposed anesthesia with the patient or authorized representative who has indicated his/her understanding and acceptance.  ? ? ? ? ? ?Plan Discussed with: CRNA ? ?Anesthesia Plan Comments: (LMA/GETA backup discussed.  Patient consented for risks of anesthesia including but not limited to:  ?- adverse reactions to medications ?- damage to eyes, teeth, lips or other oral mucosa ?- nerve damage due to positioning  ?- sore throat or hoarseness ?- damage to heart, brain, nerves, lungs, other parts of body or loss of life ? ?Informed patient about role of CRNA in peri- and intra-operative care.  Patient voiced understanding.)  ? ? ? ? ? ? ?Anesthesia Quick Evaluation ? ?

## 2021-10-11 ENCOUNTER — Telehealth: Payer: Self-pay

## 2021-10-11 ENCOUNTER — Other Ambulatory Visit: Payer: Self-pay

## 2021-10-11 DIAGNOSIS — R195 Other fecal abnormalities: Secondary | ICD-10-CM

## 2021-10-11 MED ORDER — CLENPIQ 10-3.5-12 MG-GM -GM/160ML PO SOLN: 1.0000 | Freq: Once | ORAL | 0 refills | Status: AC

## 2021-10-11 NOTE — Telephone Encounter (Signed)
Return call made to Santiago Glad to reschedule her mothers Colonoscopy it has been rescheduled to 10/19/21 with Dr. Vicente Males due to intolerance of Suprep.  Prep has been changed to ClenPiq.  Instructions were provided to Santiago Glad patients daughter. New order has been placed for procedure. ? ?Thanks, ?Sharyn Lull, CMA ?

## 2021-10-13 NOTE — Telephone Encounter (Signed)
Vanetta Mulders, CMA ?  ?   8:59 AM ?Note ?Return call made to Santiago Glad to reschedule her mothers Colonoscopy it has been rescheduled to 10/19/21 with Dr. Vicente Males due to intolerance of Suprep.  Prep has been changed to ClenPiq.  Instructions were provided to Santiago Glad patients daughter. New order has been placed for procedure. ?  ?Thanks, ?Sharyn Lull, CMA  ?  ?She called on 10/11/21 ?

## 2021-10-17 DIAGNOSIS — Z20822 Contact with and (suspected) exposure to covid-19: Secondary | ICD-10-CM | POA: Diagnosis not present

## 2021-10-19 ENCOUNTER — Ambulatory Visit: Payer: Medicare Other | Admitting: Anesthesiology

## 2021-10-19 ENCOUNTER — Encounter
Admission: RE | Disposition: A | Discharge status: Home / Self Care | Payer: Self-pay | Source: Home / Self Care | Attending: Gastroenterology

## 2021-10-19 ENCOUNTER — Other Ambulatory Visit: Payer: Self-pay

## 2021-10-19 ENCOUNTER — Encounter: Payer: Self-pay | Admitting: Gastroenterology

## 2021-10-19 ENCOUNTER — Ambulatory Visit
Admission: RE | Admit: 2021-10-19 | Discharge: 2021-10-19 | Disposition: A | Discharge status: Home / Self Care | Payer: Medicare Other | Attending: Gastroenterology | Admitting: Gastroenterology

## 2021-10-19 DIAGNOSIS — D125 Benign neoplasm of sigmoid colon: Secondary | ICD-10-CM | POA: Diagnosis not present

## 2021-10-19 DIAGNOSIS — N184 Chronic kidney disease, stage 4 (severe): Secondary | ICD-10-CM | POA: Insufficient documentation

## 2021-10-19 DIAGNOSIS — E1122 Type 2 diabetes mellitus with diabetic chronic kidney disease: Secondary | ICD-10-CM | POA: Insufficient documentation

## 2021-10-19 DIAGNOSIS — Z980 Intestinal bypass and anastomosis status: Secondary | ICD-10-CM | POA: Diagnosis not present

## 2021-10-19 DIAGNOSIS — I129 Hypertensive chronic kidney disease with stage 1 through stage 4 chronic kidney disease, or unspecified chronic kidney disease: Secondary | ICD-10-CM | POA: Insufficient documentation

## 2021-10-19 DIAGNOSIS — K635 Polyp of colon: Secondary | ICD-10-CM | POA: Diagnosis not present

## 2021-10-19 DIAGNOSIS — K640 First degree hemorrhoids: Secondary | ICD-10-CM | POA: Insufficient documentation

## 2021-10-19 DIAGNOSIS — R195 Other fecal abnormalities: Secondary | ICD-10-CM | POA: Diagnosis not present

## 2021-10-19 DIAGNOSIS — K219 Gastro-esophageal reflux disease without esophagitis: Secondary | ICD-10-CM | POA: Insufficient documentation

## 2021-10-19 DIAGNOSIS — K5730 Diverticulosis of large intestine without perforation or abscess without bleeding: Secondary | ICD-10-CM | POA: Diagnosis not present

## 2021-10-19 DIAGNOSIS — Z87891 Personal history of nicotine dependence: Secondary | ICD-10-CM | POA: Insufficient documentation

## 2021-10-19 DIAGNOSIS — Z1211 Encounter for screening for malignant neoplasm of colon: Secondary | ICD-10-CM | POA: Insufficient documentation

## 2021-10-19 HISTORY — PX: COLONOSCOPY WITH PROPOFOL: SHX5780

## 2021-10-19 SURGERY — COLONOSCOPY WITH PROPOFOL
Anesthesia: General

## 2021-10-19 MED ORDER — PROPOFOL 10 MG/ML IV BOLUS
INTRAVENOUS | Status: DC | PRN
Start: 2021-10-19 — End: 2021-10-19
  Administered 2021-10-19: 40 mg via INTRAVENOUS

## 2021-10-19 MED ORDER — SODIUM CHLORIDE 0.9 % IV SOLN: INTRAVENOUS | Status: DC

## 2021-10-19 MED ORDER — STERILE WATER FOR IRRIGATION IR SOLN
Status: DC | PRN
  Administered 2021-10-19: 60 mL

## 2021-10-19 MED ORDER — PROPOFOL 10 MG/ML IV BOLUS
INTRAVENOUS | Status: AC
  Filled 2021-10-19: qty 20

## 2021-10-19 MED ORDER — SODIUM CHLORIDE (PF) 0.9 % IJ SOLN
INTRAMUSCULAR | Status: DC | PRN
  Administered 2021-10-19: 6 mL via INTRAVENOUS

## 2021-10-19 MED ORDER — PROPOFOL 500 MG/50ML IV EMUL
INTRAVENOUS | Status: DC | PRN
  Administered 2021-10-19: 100 ug/kg/min via INTRAVENOUS

## 2021-10-19 MED ORDER — LIDOCAINE HCL (CARDIAC) PF 100 MG/5ML IV SOSY
PREFILLED_SYRINGE | INTRAVENOUS | Status: DC | PRN
  Administered 2021-10-19: 50 mg via INTRAVENOUS

## 2021-10-19 NOTE — Op Note (Signed)
[facility] ?Gastroenterology ?Patient Name: Alexis Tucker ?Procedure Date: 10/19/2021 10:51 AM ?MRN: [id number] ?Account #: [id number] ?Date of Birth: [birth year] ?Admit Type: Outpatient ?Age: 81 ?Room: [facility] ENDO ROOM 3 ?Gender: Female ?Note Status: Finalized ?Instrument Name: Colonscope [id number] ?Procedure:             Colonoscopy ?Indications:           Heme positive stool ?Providers:             Jonathon Bellows MD, MD ?Referring MD:          Ria Bush (Referring MD) ?Medicines:             Monitored Anesthesia Care ?Complications:         No immediate complications. ?Procedure:             Pre-Anesthesia Assessment: ?                       - Prior to the procedure, a History and Physical was  ?                       performed, and patient medications, allergies and  ?                       sensitivities were reviewed. The patient's tolerance  ?                       of previous anesthesia was reviewed. ?                       - The risks and benefits of the procedure and the  ?                       sedation options and risks were discussed with the  ?                       patient. All questions were answered and informed  ?                       consent was obtained. ?                       - ASA Grade Assessment: II - A patient with mild  ?                       systemic disease. ?                       After obtaining informed consent, the colonoscope was  ?                       passed under direct vision. Throughout the procedure,  ?                       the patient's blood pressure, pulse, and oxygen  ?                       saturations were monitored continuously. The  ?                       Colonoscope was introduced through the anus  and  ?                       advanced to the the ileocolonic anastomosis. The  ?                       colonoscopy was performed with ease. The patient  ?                       tolerated the procedure well. The quality of the bowel  ?                        preparation was excellent. ?Findings: ?     The perianal and digital rectal examinations were normal. ?     There was evidence of a prior end-to-side ileo-colonic anastomosis in  ?     the ascending colon. ?     Two sessile polyps were found in the sigmoid colon and transverse colon.  ?     The polyps were 4 to 5 mm in size. These polyps were removed with a cold  ?     snare. Resection and retrieval were complete. ?     Multiple small and large-mouthed diverticula were found in the sigmoid  ?     colon. ?     Non-bleeding internal hemorrhoids were found during retroflexion. The  ?     hemorrhoids were large and Grade I (internal hemorrhoids that do not  ?     prolapse). ?     A 25 mm polyp was found in the recto-sigmoid colon. The polyp was  ?     semi-pedunculated. Preparations were made for mucosal resection. Saline  ?     was injected to raise the lesion. Snare mucosal resection was performed.  ?     Resection and retrieval were complete. Coagulation for destruction of  ?     remaining portion of lesion using snare was successful. To prevent  ?     bleeding after mucosal resection, one hemostatic clip was successfully  ?     placed. There was no bleeding during, or at the end, of the procedure.  ?     Borders of polyp marked using blue or nbi light ?     The exam was otherwise without abnormality on direct and retroflexion  ?     views. ?Impression:            - End-to-side ileo-colonic anastomosis. ?                       - Two 4 to 5 mm polyps in the sigmoid colon and in the  ?                       transverse colon, removed with a cold snare. Resected  ?                       and retrieved. ?                       - Diverticulosis in the sigmoid colon. ?                       - Non-bleeding internal hemorrhoids. ?                       -  One 25 mm polyp at the recto-sigmoid colon, removed  ?                       with mucosal resection. Resected and retrieved.  ?                       Treated with a hot snare.  Clip was placed. ?                       - The examination was otherwise normal on direct and  ?                       retroflexion views. ?                       - Mucosal resection was performed. Resection and  ?                       retrieval were complete. ?Recommendation:        - Discharge patient to home (with escort). ?                       - Resume previous diet. ?                       - Continue present medications. ?                       - Await pathology results. ?                       - Repeat colonoscopy is not recommended due to current  ?                       age (40 years or older) for surveillance based on  ?                       pathology results. ?Procedure Code(s):     --- Professional --- ?                       [id number], Colonoscopy, flexible; with endoscopic mucosal  ?                       resection ?                       38182, 59, Colonoscopy, flexible; with removal of  ?                       tumor(s), polyp(s), or other lesion(s) by snare  ?                       technique ?Diagnosis Code(s):     --- Professional --- ?                       Z98.0, Intestinal bypass and anastomosis status ?                       K63.5, Polyp of colon ?  K64.0, First degree hemorrhoids ?                       R19.5, Other fecal abnormalities ?                       K57.30, Diverticulosis of large intestine without  ?                       perforation or abscess without bleeding ?CPT copyright 2019 American Medical Association. All rights reserved. ?The codes documented in this report are preliminary and upon coder review may  ?be revised to meet current compliance requirements. ?Jonathon Bellows, MD ?Jonathon Bellows MD, MD ?10/19/2021 11:30:02 AM ?This report has been signed electronically. ?Number of Addenda: 0 ?Note Initiated On: 10/19/2021 10:51 AM ?Scope Withdrawal Time: 0 hours 11 minutes 23 seconds  ?Total Procedure Duration: 0 hours 15 minutes 59 seconds  ?Estimated Blood Loss:   Estimated blood loss: none. ?     [facility] ?

## 2021-10-19 NOTE — Anesthesia Postprocedure Evaluation (Signed)
Anesthesia Post Note ? ?Patient: Alexis Tucker ? ?Procedure(s) Performed: COLONOSCOPY WITH PROPOFOL ? ?Patient location during evaluation: Endoscopy ?Anesthesia Type: General ?Level of consciousness: awake and alert ?Pain management: pain level controlled ?Vital Signs Assessment: post-procedure vital signs reviewed and stable ?Respiratory status: spontaneous breathing, nonlabored ventilation, respiratory function stable and patient connected to nasal cannula oxygen ?Cardiovascular status: blood pressure returned to baseline and stable ?Postop Assessment: no apparent nausea or vomiting ?Anesthetic complications: no ? ? ?No notable events documented. ? ? ?Last Vitals:  ?Vitals:  ? 10/19/21 1140 10/19/21 1150  ?BP: (!) 156/106 (!) 173/98  ?Pulse: 80 78  ?Resp: 16 18  ?Temp:    ?SpO2: 100% 100%  ?  ?Last Pain:  ?Vitals:  ? 10/19/21 1150  ?TempSrc:   ?PainSc: 0-No pain  ? ? ?  ?  ?  ?  ?  ?  ? ?[name] ? ? ? ? ?

## 2021-10-19 NOTE — Transfer of Care (Signed)
Immediate Anesthesia Transfer of Care Note ? ?Patient: Alexis Tucker ? ?Procedure(s) Performed: COLONOSCOPY WITH PROPOFOL ? ?Patient Location: PACU and Endoscopy Unit ? ?Anesthesia Type:General ? ?Level of Consciousness: drowsy and patient cooperative ? ?Airway & Oxygen Therapy: Patient Spontanous Breathing ? ?Post-op Assessment: Report given to RN and Post -op Vital signs reviewed and stable ? ?Post vital signs: Reviewed and stable ? ?Last Vitals:  ?Vitals Value Taken Time  ?BP 148/62 10/19/21 1130  ?Temp 36.2 ?C 10/19/21 1130  ?Pulse 75 10/19/21 1131  ?Resp 19 10/19/21 1131  ?SpO2 100 % 10/19/21 1131  ?Vitals shown include unvalidated device data. ? ?Last Pain:  ?Vitals:  ? 10/19/21 1130  ?TempSrc: Tympanic  ?PainSc: Asleep  ?   ? ?  ? ?Complications: No notable events documented. ?

## 2021-10-19 NOTE — Anesthesia Preprocedure Evaluation (Signed)
Anesthesia Evaluation  ?Patient identified by MRN, date of birth, ID band ?Patient awake ? ? ? ?Reviewed: ?Allergy & Precautions, NPO status , Patient's Chart, lab work & pertinent test results ? ?History of Anesthesia Complications ?(+) PONV and history of anesthetic complications ? ?Airway ?Mallampati: III ? ?TM Distance: >3 FB ?Neck ROM: full ? ? ? Dental ? ?(+) Lower Dentures, Upper Dentures, Missing ?  ?Pulmonary ?neg shortness of breath, former smoker,  ?  ?Pulmonary exam normal ? ? ? ? ? ? ? Cardiovascular ?Exercise Tolerance: Good ?hypertension, (-) anginaNormal cardiovascular exam ? ? ?  ?Neuro/Psych ?negative neurological ROS ? negative psych ROS  ? GI/Hepatic ?Neg liver ROS, GERD  Controlled,  ?Endo/Other  ?diabetes, Type 2Hypothyroidism  ? Renal/GU ?Renal disease  ?negative genitourinary ?  ?Musculoskeletal ? ? Abdominal ?  ?Peds ? Hematology ?negative hematology ROS ?(+)   ?Anesthesia Other Findings ?Past Medical History: ?No date: Anemia ?    Comment:  per prior pcp ?2008: Breast cancer ([place]) ?    Comment:  F/U radiation  ?11/27/2017: Breast cancer ([place]) ?    Comment:  mpT2 pN0 ER/PR positive, HER-2/neu negative.  2.5 cm  ?             maximum diameter, multifocal.  MammoSite ?01/2014: Carotid stenosis, asymptomatic ?    Comment:  mild ICA, severe in ECA, rec rpt [place] 2 years ?No date: Cataracts, bilateral ?01/2012: CKD (chronic kidney disease) stage 4, GFR 15-29 ml/min (HCC) ?    Comment:  Cr 3.85, in 3s since 2010, discussing HD and L forearm  ?             graft (Singh/Schnier) ?2000s: Diabetes type 2, controlled ([place]) ?03/2014: Diabetic retinopathy with macular edema ([place]) ?    Comment:  [place] at [facility] eye on avastin ?No date: DJD (degenerative joint disease) ?    Comment:  per prior pcp ?No date: GERD (gastroesophageal reflux disease) ?03/2014: Glaucoma ?2008: History of breast cancer ?    Comment:  infiltrating ductal carcinoma s/p mammosite ?2006: History of  colon cancer ?    Comment:  s/p colectomy ?1990s: History of hepatitis A ?    Comment:  from food, resolved ?No date: HTN (hypertension) ?No date: Macular degeneration ?    Comment:  wet R with vision loss, dry L; to see retina specialist ?No date: Neuropathy ?    Comment:  per prior pcp ?No date: OA (osteoarthritis) ?    Comment:  knees and hips ?No date: Osteonecrosis ([place]) ?    Comment:  hips? ?2008: Personal history of radiation therapy ?    Comment:  F/U Left Breast Cancer ?2019: Personal history of radiation therapy ?    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    Comment:  partial upper ? ?Past Surgical History: ?1976: ABDOMINAL HYSTERECTOMY ?    Comment:  partial, after tubal pregnancy, h/o fibroids with heavy  ?             bleeding ?2008: BREAST BIOPSY; Left ?    Comment:  positive ?11/22/2017: BREAST BIOPSY; Left ?    Comment:  2oclock 2cmfn wing shaped clip, INVASIVE MAMMARY  ?             CARCINOMA ?11/22/2017: BREAST BIOPSY; Left ?    Comment:  2oclock 4cmfn coil shaped clip, INVASIVE MAMMARY  ?  CARCINOMA ?11/22/2017: BREAST BIOPSY; Left ?    Comment:  lymph node - butterfly hydroMARK ?01/07/2018: BREAST EXCISIONAL BIOPSY; Left ?    Comment:  lumpectomy by Dr. Bary Castilla ?2008: BREAST LUMPECTOMY; Left ?    Comment:  F/U radiation ?2019: BREAST LUMPECTOMY; Left ?    Comment:  2 areas of [place] at 2:00 position f/u with mammosite ?01/07/2018: BREAST LUMPECTOMY WITH SENTINEL LYMPH NODE BIOPSY; Left ?    Comment:  Procedure: BREAST LUMPECTOMY WITH SENTINEL LYMPH NODE  ?             BX;  Surgeon: Robert Bellow, MD;  Location: [place]  ?             ORS;  Service: General;  Laterality: Left; ?2008: BREAST MAMMOSITE ?12/2012: carotid ultrasound ?    Comment:  1-39% bilateral stenosis, severe in ECA ?08/14/2016: CATARACT EXTRACTION W/PHACO; Left ?    Comment:  Procedure: CATARACT EXTRACTION  PHACO AND INTRAOCULAR  ?             LENS PLACEMENT (IOC)  Left Diabetic;  Surgeon: Rodena Piety  ?             Thomes Lolling, MD;  Location: [place];   ?             Service: Ophthalmology;  Laterality: Left;  Diabetic -  ?             oral meds ?2005: CHOLECYSTECTOMY ?2005: COLECTOMY ?    Comment:  colon cancer ?2006: COLON SURGERY ?    Comment:  bowel obstruction ?2008: COLONOSCOPY ?    Comment:  ext hem, ileo-colonic anastomosis in cecum, tubular  ?             adenoma x1, rec rpt 1 yr for multiple polyps (in [place]) ?05/06/2016: ESOPHAGOGASTRODUODENOSCOPY; N/A ?    Comment:  Procedure: ESOPHAGOGASTRODUODENOSCOPY (EGD);  Surgeon:  ?             Manya Silvas, MD;  Location: [facility] ENDOSCOPY;   ?             Service: Endoscopy;  Laterality: N/A; ?No date: EYE SURGERY ?No date: JOINT REPLACEMENT ?No date: THYROIDECTOMY, PARTIAL ?    Comment:  unclear why ?1999: TOTAL KNEE ARTHROPLASTY; Right ?11/2007: US ECHOCARDIOGRAPHY ?    Comment:  nl LV fxn EF 65%, diastolic dysfunction, LVH, tr TR and  ?             MR, slcerotic aortic valve ? ? ? ? Reproductive/Obstetrics ?negative OB ROS ? ?  ? ? ? ? ? ? ? ? ? ? ? ? ? ?  ?  ? ? ? ? ? ? ? ? ?Anesthesia Physical ?Anesthesia Plan ? ?ASA: 3 ? ?Anesthesia Plan: General  ? ?Post-op Pain Management:   ? ?Induction: Intravenous ? ?PONV Risk Score and Plan: Propofol infusion and TIVA ? ?Airway Management Planned: Natural Airway and Nasal Cannula ? ?Additional Equipment:  ? ?Intra-op Plan:  ? ?Post-operative Plan:  ? ?Informed Consent: I have reviewed the patients History and Physical, chart, labs and discussed the procedure including the risks, benefits and alternatives for the proposed anesthesia with the patient or authorized representative who has indicated his/her understanding and acceptance.  ? ? ? ?Dental Advisory Given ? ?Plan Discussed with: Anesthesiologist, CRNA and Surgeon ? ?Anesthesia Plan Comments: (Patient consented for risks of anesthesia including but not  limited to:  ?- adverse reactions to medications ?- risk of  airway placement if required ?- damage to eyes, teeth, lips or other oral mucosa ?- nerve damage due to positioning  ?- sore throat or hoarseness ?- Damage to heart, brain, nerves, lungs, other parts of body or loss of life ? ?Patient voiced understanding.)  ? ? ? ? ? ? ?Anesthesia Quick Evaluation ? ?

## 2021-10-19 NOTE — Anesthesia Procedure Notes (Signed)
Procedure Name: [place] ?Date/Time: 10/19/2021 11:00 AM ?Performed by: Jerrye Noble, CRNA ?Pre-anesthesia Checklist: Patient identified, Emergency Drugs available, Suction available and Patient being monitored ?Patient Re-evaluated:Patient Re-evaluated prior to induction ?Oxygen Delivery Method: Nasal cannula ? ? ? ? ?

## 2021-10-19 NOTE — H&P (Signed)
? ? ? ?Jonathon Bellows, MD ?[address], [place], [place], [place], [address] ?[address] [place], [place], [place], [address] ?Phone: [phone]  ?Fax: [phone] ? ?Primary Care Physician:  Ria Bush, MD ? ? ?Pre-Procedure History & Physical: ?HPI:  Alexis Tucker is a 81 y.o. female is here for an colonoscopy. ?  ?Past Medical History:  ?Diagnosis Date  ? Anemia   ? per prior pcp  ? Breast cancer ([place]) 2008  ? F/U radiation   ? Breast cancer ([place]) 11/27/2017  ? mpT2 pN0 ER/PR positive, HER-2/neu negative.  2.5 cm maximum diameter, multifocal.  MammoSite  ? Carotid stenosis, asymptomatic 01/2014  ? mild ICA, severe in ECA, rec rpt [place] 2 years  ? Cataracts, bilateral   ? 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 Cr 3.85, in 3s since 2010, discussing HD and L forearm graft (Singh/Schnier)  ? Diabetes type 2, controlled ([place]) 2000s  ? Diabetic retinopathy with macular edema ([place]) 03/2014  ? [place] at [facility] eye on avastin  ? DJD (degenerative joint disease)   ? per prior pcp  ? GERD (gastroesophageal reflux disease)   ? Glaucoma 03/2014  ? History of breast cancer 2008  ? infiltrating ductal carcinoma s/p mammosite  ? History of colon cancer 2006  ? s/p colectomy  ? History of hepatitis A 1990s  ? from food, resolved  ? HTN (hypertension)   ? Macular degeneration   ? wet R with vision loss, dry L; to see retina specialist  ? Neuropathy   ? per prior pcp  ? OA (osteoarthritis)   ? knees and hips  ? Osteonecrosis ([place])   ? hips?  ? Personal history of radiation therapy 2008  ? F/U Left Breast Cancer  ? Personal history of radiation therapy 2019  ? mammosite left breast ca  ? PONV (postoperative nausea and vomiting)   ? Post-surgical hypothyroidism   ? Wears dentures   ? partial upper  ? ? ?Past Surgical History:  ?Procedure Laterality Date  ? ABDOMINAL HYSTERECTOMY  1976  ? partial, after tubal pregnancy, h/o fibroids with heavy bleeding  ? BREAST BIOPSY Left 2008  ? positive   ? BREAST BIOPSY Left 11/22/2017  ? 2oclock 2cmfn wing shaped clip, INVASIVE MAMMARY CARCINOMA  ? BREAST BIOPSY Left 11/22/2017  ? 2oclock 4cmfn coil shaped clip, INVASIVE MAMMARY CARCINOMA  ? BREAST BIOPSY Left 11/22/2017  ? lymph node - butterfly hydroMARK  ? BREAST EXCISIONAL BIOPSY Left 01/07/2018  ? lumpectomy by Dr. Bary Castilla  ? BREAST LUMPECTOMY Left 2008  ? F/U radiation  ? BREAST LUMPECTOMY Left 2019  ? 2 areas of [place] at 2:00 position f/u with mammosite  ? BREAST LUMPECTOMY WITH SENTINEL LYMPH NODE BIOPSY Left 01/07/2018  ? Procedure: BREAST LUMPECTOMY WITH SENTINEL LYMPH NODE BX;  Surgeon: Robert Bellow, MD;  Location: [facility] ORS;  Service: General;  Laterality: Left;  ? BREAST MAMMOSITE  2008  ? carotid ultrasound  12/2012  ? 1-39% bilateral stenosis, severe in ECA  ? CATARACT EXTRACTION W/PHACO Left 08/14/2016  ? Procedure: CATARACT EXTRACTION PHACO AND INTRAOCULAR LENS PLACEMENT (IOC)  Left Diabetic;  Surgeon: Ronnell Freshwater, MD;  Location: [place];  Service: Ophthalmology;  Laterality: Left;  Diabetic - oral meds  ? CHOLECYSTECTOMY  2005  ? COLECTOMY  2005  ? colon cancer  ? COLON SURGERY  2006  ? bowel obstruction  ? COLONOSCOPY  2008  ? ext hem, ileo-colonic anastomosis  in cecum, tubular adenoma x1, rec rpt 1 yr for multiple polyps (in [place])  ? ESOPHAGOGASTRODUODENOSCOPY N/A 05/06/2016  ? Procedure: ESOPHAGOGASTRODUODENOSCOPY (EGD);  Surgeon: Manya Silvas, MD;  Location: [facility] ENDOSCOPY;  Service: Endoscopy;  Laterality: N/A;  ? EYE SURGERY    ? JOINT REPLACEMENT    ? THYROIDECTOMY, PARTIAL    ? unclear why  ? TOTAL KNEE ARTHROPLASTY Right 1999  ? US ECHOCARDIOGRAPHY  11/2007  ? nl LV fxn EF 22%, diastolic dysfunction, LVH, tr TR and MR, slcerotic aortic valve  ? ? ?Prior to Admission medications   ?Medication Sig Start Date End Date Taking? Authorizing Provider  ?acetaminophen (TYLENOL) 325 MG tablet Take 1 tablet (325 mg total) by mouth every 6 (six) hours as needed for mild  pain (or Fever >/= 101). 08/27/17  Yes Ria Bush, MD  ?Acetylcysteine 600 MG CAPS Take by mouth daily.   Yes [provider]  ?allopurinol (ZYLOPRIM) 100 MG tablet Take 1 tablet (100 mg total) by mouth daily. 07/14/21  Yes Ria Bush, MD  ?amLODipine (NORVASC) 5 MG tablet Take 5 mg by mouth daily. 09/15/21  Yes [provider]  ?anastrozole (ARIMIDEX) 1 MG tablet Take 1 tablet (1 mg total) by mouth daily. 09/19/21  Yes Ria Bush, MD  ?aspirin EC 81 MG EC tablet Take 1 tablet (81 mg total) by mouth daily. 05/09/16  Yes Gladstone Lighter, MD  ?Blood Glucose Monitoring Suppl [facility] BLOOD GLUCOSE METER) W/DEVICE KIT Use as directed to check sugars daily 250.42 03/13/14  Yes Ria Bush, MD  ?CALCIUM-MAGNESIUM PO Take 1 tablet by mouth daily.   Yes [provider]  ?carvedilol (COREG) 25 MG tablet Take 1 tablet (25 mg total) by mouth daily. 07/14/21  Yes Ria Bush, MD  ?Cholecalciferol (VITAMIN D-3) 5000 UNITS TABS Take 1 tablet by mouth daily.   Yes [provider]  ?diclofenac sodium (VOLTAREN) 1 % GEL APPLY 1 APPLICATION TOPICALLY 3 (THREE) TIMES DAILY. 10/30/17  Yes Ria Bush, MD  ?ferrous sulfate (SLOW IRON) 160 (50 Fe) MG TBCR SR tablet Take 1 tablet (160 mg total) by mouth daily. 11/07/16  Yes Ria Bush, MD  ?Folic Acid-Vit L7-LGX Q11 (FOLBEE) 2.5-25-1 MG TABS tablet Take 1 tablet by mouth daily.   Yes [provider]  ?isosorbide dinitrate (ISORDIL) 20 MG tablet Take 1 tablet (20 mg total) by mouth daily. 07/14/21  Yes Ria Bush, MD  ?levothyroxine (SYNTHROID) 88 MCG tablet Take 1 tablet (88 mcg total) by mouth daily. 09/19/21  Yes Ria Bush, MD  ?Netarsudil Dimesylate (RHOPRESSA) 0.02 % SOLN Apply to eye once a week.   Yes [provider]  ?pantoprazole (PROTONIX) 40 MG tablet Take 1 tablet (40 mg total) by mouth daily as needed. 09/19/21  Yes Ria Bush, MD  ?pentoxifylline (TRENTAL) 400 MG CR  tablet Take 1 tablet (400 mg total) by mouth daily. 07/15/21  Yes Ria Bush, MD  ?saxagliptin HCl (ONGLYZA) 2.5 MG TABS tablet Take 1 tablet (2.5 mg total) by mouth daily. 09/19/21  Yes Ria Bush, MD  ?sodium bicarbonate 650 MG tablet Take 650 mg by mouth 2 (two) times daily as needed for heartburn.   Yes [provider]  ? ? ?Allergies as of 10/11/2021 - Review Complete 10/10/2021  ?Allergen Reaction Noted  ? Tramadol Other (See Comments) 12/05/2012  ? Codeine Anxiety 12/05/2012  ? Sulfa antibiotics Itching and Rash 12/05/2012  ? ? ?Family History  ?Problem Relation Age of Onset  ? Diabetes Mother   ?  Hypertension Mother   ? Arthritis Mother   ? Alcohol abuse Father   ? Arthritis Father   ? Stroke Daughter   ? CAD Neg Hx   ? Cancer Neg Hx   ? Breast cancer Neg Hx   ? ? ?Social History  ? ?Socioeconomic History  ? Marital status: Divorced  ?  Spouse name: Not on file  ? Number of children: Not on file  ? Years of education: Not on file  ? Highest education level: Not on file  ?Occupational History  ? Not on file  ?Tobacco Use  ? Smoking status: Former  ? Smokeless tobacco: Never  ? Tobacco comments:  ?  quit over 40 yrs ago  ?Vaping Use  ? Vaping Use: Never used  ?Substance and Sexual Activity  ? Alcohol use: Never  ?  Comment: rare  ? Drug use: Never  ? Sexual activity: Not Currently  ?Other Topics Concern  ? Not on file  ?Social History Narrative  ? Lives alone, 1 dog.   Granddaughter in [place] (attorney).  ? Divorced  ? Occu: retired, worked in Engineer, technical sales  ? Edu: college  ? ?Social Determinants of Health  ? ?Financial Resource Strain: Not on file  ?Food Insecurity: Not on file  ?Transportation Needs: Not on file  ?Physical Activity: Not on file  ?Stress: Not on file  ?Social Connections: Not on file  ?Intimate Partner Violence: Not on file  ? ? ?Review of Systems: ?See HPI, otherwise negative ROS ? ?Physical Exam: ?BP (!) 180/70   Pulse 74   Temp (!) 97 ?F (36.1 ?C)   Resp 16   Ht 5' 3" (1.6 m)    Wt 90.3 kg   SpO2 100%   BMI 35.25 kg/m?  ?General:   Alert,  pleasant and cooperative in NAD ?Head:  Normocephalic and atraumatic. ?Neck:  Supple; no masses or thyromegaly. ?Lungs:  Clear throughout to au

## 2021-10-20 DIAGNOSIS — Z20822 Contact with and (suspected) exposure to covid-19: Secondary | ICD-10-CM | POA: Diagnosis not present

## 2021-10-21 ENCOUNTER — Encounter: Payer: Self-pay | Admitting: Gastroenterology

## 2021-10-21 LAB — SURGICAL PATHOLOGY

## 2021-10-22 ENCOUNTER — Encounter: Payer: Self-pay | Admitting: Family Medicine

## 2021-10-24 NOTE — Progress Notes (Signed)
Inform colon polyp was precancerous - repeat flexible sigmidoscopy in 1 year to evaluate polypectomy site

## 2021-10-26 ENCOUNTER — Telehealth: Payer: Self-pay

## 2021-10-26 DIAGNOSIS — E113312 Type 2 diabetes mellitus with moderate nonproliferative diabetic retinopathy with macular edema, left eye: Secondary | ICD-10-CM | POA: Diagnosis not present

## 2021-10-26 NOTE — Telephone Encounter (Signed)
Called patient but had to leave her a detailed message. I will also send her a MyChart message. I will also put her in our recall list in a year so she could have a repeat flexible sigmoidoscopy to evaluate her polypectomy site.  ?

## 2021-10-26 NOTE — Telephone Encounter (Signed)
-----   Message from Jonathon Bellows, MD sent at 10/24/2021 10:36 AM EDT ----- ?Inform colon polyp was precancerous - repeat flexible sigmidoscopy in 1 year to evaluate polypectomy site  ?

## 2021-11-03 ENCOUNTER — Telehealth: Payer: Self-pay | Admitting: Family Medicine

## 2021-11-03 NOTE — Telephone Encounter (Signed)
Left message for patient to call back and schedule Medicare Annual Wellness Visit (AWV) either virtually or phone ? ? ?Last AWV ;06/1720 ? please schedule at anytime with health coach ?I left my direct # [phone] ?

## 2021-11-07 DIAGNOSIS — Z20822 Contact with and (suspected) exposure to covid-19: Secondary | ICD-10-CM | POA: Diagnosis not present

## 2021-11-08 ENCOUNTER — Other Ambulatory Visit: Payer: Self-pay | Admitting: Family Medicine

## 2021-11-15 ENCOUNTER — Emergency Department: Payer: Medicare Other

## 2021-11-15 ENCOUNTER — Encounter: Payer: Self-pay | Admitting: Internal Medicine

## 2021-11-15 ENCOUNTER — Other Ambulatory Visit: Payer: Self-pay

## 2021-11-15 ENCOUNTER — Inpatient Hospital Stay
Admission: EM | Admit: 2021-11-15 | Discharge: 2021-11-17 | DRG: 309 | Disposition: A | Discharge status: Home / Self Care | Payer: Medicare Other | Source: Ambulatory Visit | Attending: Internal Medicine | Admitting: Internal Medicine

## 2021-11-15 DIAGNOSIS — D631 Anemia in chronic kidney disease: Secondary | ICD-10-CM | POA: Diagnosis not present

## 2021-11-15 DIAGNOSIS — K219 Gastro-esophageal reflux disease without esophagitis: Secondary | ICD-10-CM | POA: Diagnosis not present

## 2021-11-15 DIAGNOSIS — Z90710 Acquired absence of both cervix and uterus: Secondary | ICD-10-CM

## 2021-11-15 DIAGNOSIS — Z853 Personal history of malignant neoplasm of breast: Secondary | ICD-10-CM | POA: Diagnosis not present

## 2021-11-15 DIAGNOSIS — Z96651 Presence of right artificial knee joint: Secondary | ICD-10-CM | POA: Diagnosis present

## 2021-11-15 DIAGNOSIS — Z833 Family history of diabetes mellitus: Secondary | ICD-10-CM | POA: Diagnosis not present

## 2021-11-15 DIAGNOSIS — G4733 Obstructive sleep apnea (adult) (pediatric): Secondary | ICD-10-CM | POA: Diagnosis present

## 2021-11-15 DIAGNOSIS — Z7982 Long term (current) use of aspirin: Secondary | ICD-10-CM | POA: Diagnosis not present

## 2021-11-15 DIAGNOSIS — Z79890 Hormone replacement therapy (postmenopausal): Secondary | ICD-10-CM

## 2021-11-15 DIAGNOSIS — R7989 Other specified abnormal findings of blood chemistry: Secondary | ICD-10-CM | POA: Diagnosis present

## 2021-11-15 DIAGNOSIS — I4891 Unspecified atrial fibrillation: Secondary | ICD-10-CM | POA: Diagnosis present

## 2021-11-15 DIAGNOSIS — Z8249 Family history of ischemic heart disease and other diseases of the circulatory system: Secondary | ICD-10-CM

## 2021-11-15 DIAGNOSIS — I480 Paroxysmal atrial fibrillation: Principal | ICD-10-CM | POA: Diagnosis present

## 2021-11-15 DIAGNOSIS — R011 Cardiac murmur, unspecified: Secondary | ICD-10-CM | POA: Diagnosis not present

## 2021-11-15 DIAGNOSIS — I10 Essential (primary) hypertension: Secondary | ICD-10-CM | POA: Diagnosis present

## 2021-11-15 DIAGNOSIS — Z85038 Personal history of other malignant neoplasm of large intestine: Secondary | ICD-10-CM

## 2021-11-15 DIAGNOSIS — Z923 Personal history of irradiation: Secondary | ICD-10-CM | POA: Diagnosis not present

## 2021-11-15 DIAGNOSIS — D696 Thrombocytopenia, unspecified: Secondary | ICD-10-CM | POA: Diagnosis present

## 2021-11-15 DIAGNOSIS — Z79811 Long term (current) use of aromatase inhibitors: Secondary | ICD-10-CM | POA: Diagnosis not present

## 2021-11-15 DIAGNOSIS — Z6832 Body mass index (BMI) 32.0-32.9, adult: Secondary | ICD-10-CM

## 2021-11-15 DIAGNOSIS — Z87891 Personal history of nicotine dependence: Secondary | ICD-10-CM | POA: Diagnosis not present

## 2021-11-15 DIAGNOSIS — E872 Acidosis, unspecified: Secondary | ICD-10-CM | POA: Diagnosis present

## 2021-11-15 DIAGNOSIS — Z8673 Personal history of transient ischemic attack (TIA), and cerebral infarction without residual deficits: Secondary | ICD-10-CM

## 2021-11-15 DIAGNOSIS — I482 Chronic atrial fibrillation, unspecified: Secondary | ICD-10-CM | POA: Diagnosis present

## 2021-11-15 DIAGNOSIS — I120 Hypertensive chronic kidney disease with stage 5 chronic kidney disease or end stage renal disease: Secondary | ICD-10-CM | POA: Diagnosis present

## 2021-11-15 DIAGNOSIS — G8929 Other chronic pain: Secondary | ICD-10-CM | POA: Diagnosis present

## 2021-11-15 DIAGNOSIS — R778 Other specified abnormalities of plasma proteins: Secondary | ICD-10-CM

## 2021-11-15 DIAGNOSIS — E11319 Type 2 diabetes mellitus with unspecified diabetic retinopathy without macular edema: Secondary | ICD-10-CM | POA: Diagnosis present

## 2021-11-15 DIAGNOSIS — E1122 Type 2 diabetes mellitus with diabetic chronic kidney disease: Secondary | ICD-10-CM | POA: Diagnosis present

## 2021-11-15 DIAGNOSIS — D649 Anemia, unspecified: Secondary | ICD-10-CM | POA: Diagnosis present

## 2021-11-15 DIAGNOSIS — N2581 Secondary hyperparathyroidism of renal origin: Secondary | ICD-10-CM | POA: Diagnosis not present

## 2021-11-15 DIAGNOSIS — N184 Chronic kidney disease, stage 4 (severe): Secondary | ICD-10-CM | POA: Diagnosis not present

## 2021-11-15 DIAGNOSIS — N185 Chronic kidney disease, stage 5: Secondary | ICD-10-CM | POA: Diagnosis not present

## 2021-11-15 DIAGNOSIS — E1169 Type 2 diabetes mellitus with other specified complication: Secondary | ICD-10-CM | POA: Diagnosis present

## 2021-11-15 DIAGNOSIS — Z79899 Other long term (current) drug therapy: Secondary | ICD-10-CM

## 2021-11-15 DIAGNOSIS — I4892 Unspecified atrial flutter: Secondary | ICD-10-CM | POA: Diagnosis present

## 2021-11-15 DIAGNOSIS — R000 Tachycardia, unspecified: Secondary | ICD-10-CM | POA: Diagnosis not present

## 2021-11-15 DIAGNOSIS — E890 Postprocedural hypothyroidism: Secondary | ICD-10-CM | POA: Diagnosis present

## 2021-11-15 DIAGNOSIS — Z823 Family history of stroke: Secondary | ICD-10-CM

## 2021-11-15 DIAGNOSIS — E669 Obesity, unspecified: Secondary | ICD-10-CM | POA: Diagnosis present

## 2021-11-15 DIAGNOSIS — R791 Abnormal coagulation profile: Secondary | ICD-10-CM | POA: Diagnosis not present

## 2021-11-15 DIAGNOSIS — Z170 Estrogen receptor positive status [ER+]: Secondary | ICD-10-CM

## 2021-11-15 DIAGNOSIS — Z9049 Acquired absence of other specified parts of digestive tract: Secondary | ICD-10-CM

## 2021-11-15 DIAGNOSIS — R600 Localized edema: Secondary | ICD-10-CM | POA: Diagnosis not present

## 2021-11-15 LAB — HEPATIC FUNCTION PANEL
ALT: 10 U/L (ref 0–44)
AST: 15 U/L (ref 15–41)
Albumin: 3.8 g/dL (ref 3.5–5.0)
Alkaline Phosphatase: 70 U/L (ref 38–126)
Bilirubin, Direct: 0.1 mg/dL (ref 0.0–0.2)
Indirect Bilirubin: 0.4 mg/dL (ref 0.3–0.9)
Total Bilirubin: 0.5 mg/dL (ref 0.3–1.2)
Total Protein: 7.5 g/dL (ref 6.5–8.1)

## 2021-11-15 LAB — APTT: aPTT: 128 seconds — ABNORMAL HIGH (ref 24–36)

## 2021-11-15 LAB — CBC
HCT: 34.1 % — ABNORMAL LOW (ref 36.0–46.0)
Hemoglobin: 10.2 g/dL — ABNORMAL LOW (ref 12.0–15.0)
MCH: 28.7 pg (ref 26.0–34.0)
MCHC: 29.9 g/dL — ABNORMAL LOW (ref 30.0–36.0)
MCV: 96.1 fL (ref 80.0–100.0)
Platelets: 131 10*3/uL — ABNORMAL LOW (ref 150–400)
RBC: 3.55 MIL/uL — ABNORMAL LOW (ref 3.87–5.11)
RDW: 14.6 % (ref 11.5–15.5)
WBC: 5.5 10*3/uL (ref 4.0–10.5)
nRBC: 0 % (ref 0.0–0.2)

## 2021-11-15 LAB — BASIC METABOLIC PANEL
Anion gap: 11 (ref 5–15)
BUN: 39 mg/dL — ABNORMAL HIGH (ref 8–23)
CO2: 20 mmol/L — ABNORMAL LOW (ref 22–32)
Calcium: 9.4 mg/dL (ref 8.9–10.3)
Chloride: 111 mmol/L (ref 98–111)
Creatinine, Ser: 4.72 mg/dL — ABNORMAL HIGH (ref 0.44–1.00)
GFR, Estimated: 9 mL/min — ABNORMAL LOW (ref >60)
Glucose, Bld: 123 mg/dL — ABNORMAL HIGH (ref 70–99)
Potassium: 5 mmol/L (ref 3.5–5.1)
Sodium: 142 mmol/L (ref 135–145)

## 2021-11-15 LAB — TROPONIN I (HIGH SENSITIVITY)
Troponin I (High Sensitivity): 25 ng/L — ABNORMAL HIGH (ref <18)
Troponin I (High Sensitivity): 24 ng/L — ABNORMAL HIGH (ref <18)

## 2021-11-15 LAB — TSH: TSH: 2.284 u[IU]/mL (ref 0.350–4.500)

## 2021-11-15 LAB — T4, FREE: Free T4: 1.09 ng/dL (ref 0.61–1.12)

## 2021-11-15 LAB — CBG MONITORING, ED: Glucose-Capillary: 142 mg/dL — ABNORMAL HIGH (ref 70–99)

## 2021-11-15 LAB — D-DIMER, QUANTITATIVE: D-Dimer, Quant: 5.72 ug/mL-FEU — ABNORMAL HIGH (ref 0.00–0.50)

## 2021-11-15 LAB — MAGNESIUM: Magnesium: 1.7 mg/dL (ref 1.7–2.4)

## 2021-11-15 MED ORDER — CARVEDILOL 25 MG PO TABS
25.0000 mg | ORAL_TABLET | Freq: Every day | ORAL | Status: DC
  Administered 2021-11-16: 25 mg via ORAL
  Filled 2021-11-15: qty 1

## 2021-11-15 MED ORDER — PENTOXIFYLLINE ER 400 MG PO TBCR
400.0000 mg | EXTENDED_RELEASE_TABLET | Freq: Every day | ORAL | Status: DC
  Administered 2021-11-16 – 2021-11-17 (×2): 400 mg via ORAL
  Filled 2021-11-15 (×2): qty 1

## 2021-11-15 MED ORDER — HEPARIN BOLUS VIA INFUSION
4000.0000 [IU] | Freq: Once | INTRAVENOUS | Status: AC
  Administered 2021-11-15: 4000 [IU] via INTRAVENOUS
  Filled 2021-11-15: qty 4000

## 2021-11-15 MED ORDER — ONDANSETRON HCL 4 MG/2ML IJ SOLN: 4.0000 mg | Freq: Four times a day (QID) | INTRAMUSCULAR | Status: DC | PRN

## 2021-11-15 MED ORDER — DILTIAZEM HCL-DEXTROSE 125-5 MG/125ML-% IV SOLN (PREMIX)
5.0000 mg/h | INTRAVENOUS | Status: DC
  Filled 2021-11-15 (×2): qty 125

## 2021-11-15 MED ORDER — DILTIAZEM HCL 30 MG PO TABS
30.0000 mg | ORAL_TABLET | Freq: Four times a day (QID) | ORAL | Status: DC
  Administered 2021-11-15 – 2021-11-17 (×6): 30 mg via ORAL
  Filled 2021-11-15 (×6): qty 1

## 2021-11-15 MED ORDER — INSULIN ASPART 100 UNIT/ML IJ SOLN
0.0000 [IU] | Freq: Three times a day (TID) | INTRAMUSCULAR | Status: DC
  Administered 2021-11-16 – 2021-11-17 (×2): 3 [IU] via SUBCUTANEOUS
  Filled 2021-11-15 (×2): qty 1

## 2021-11-15 MED ORDER — HEPARIN (PORCINE) 25000 UT/250ML-% IV SOLN
950.0000 [IU]/h | INTRAVENOUS | Status: DC
  Administered 2021-11-15: 1100 [IU]/h via INTRAVENOUS
  Administered 2021-11-16: 950 [IU]/h via INTRAVENOUS
  Filled 2021-11-15 (×2): qty 250

## 2021-11-15 MED ORDER — LEVOTHYROXINE SODIUM 88 MCG PO TABS
88.0000 ug | ORAL_TABLET | Freq: Every day | ORAL | Status: DC
  Administered 2021-11-16 – 2021-11-17 (×2): 88 ug via ORAL
  Filled 2021-11-15 (×2): qty 1

## 2021-11-15 MED ORDER — ALLOPURINOL 100 MG PO TABS
100.0000 mg | ORAL_TABLET | Freq: Every day | ORAL | Status: DC
  Administered 2021-11-16 – 2021-11-17 (×2): 100 mg via ORAL
  Filled 2021-11-15 (×3): qty 1

## 2021-11-15 MED ORDER — ACETAMINOPHEN 325 MG PO TABS: 650.0000 mg | ORAL_TABLET | ORAL | Status: DC | PRN

## 2021-11-15 MED ORDER — DILTIAZEM LOAD VIA INFUSION
10.0000 mg | Freq: Once | INTRAVENOUS | Status: DC
Start: 2021-11-15 — End: 2021-11-15
  Filled 2021-11-15: qty 10

## 2021-11-15 MED ORDER — ANASTROZOLE 1 MG PO TABS
1.0000 mg | ORAL_TABLET | Freq: Every day | ORAL | Status: DC
  Administered 2021-11-16 – 2021-11-17 (×2): 1 mg via ORAL
  Filled 2021-11-15 (×2): qty 1

## 2021-11-15 NOTE — ED Triage Notes (Addendum)
Pt to ED via POV from nephrologist. Pt reports HR was in 130s at MD office. Pt HR currently 120 and showing afib RVR on EKG. Pt denies CP or SOB. Pt not currently on dialysis. Pt with hx HTN, kidney disease and DM. Pt denies any complaints at the moment.  ?

## 2021-11-15 NOTE — ED Provider Triage Note (Signed)
Emergency Medicine Provider Triage Evaluation Note ? ?Alexis Tucker , a 81 y.o. female  was evaluated in triage.  Patient noted to have elevated heart rate of 130 at nephrology office.  She has chronic kidney disease but stable, not yet requiring dialysis.  Patient asymptomatic states she feels well with no chest pain or shortness of breath.  She denies feeling weak or fatigued.  No palpitations.  No history of A-fib or blood clots ? ?Review of Systems  ?Positive: Tachycardia ?Negative: Chest pain, shortness of breath, weakness, fatigue, dizziness or lightheadedness ? ?Physical Exam  ?There were no vitals taken for this visit. ?Gen:   Awake, no distress presents in a wheelchair ?Resp:  Normal effort  ?MSK:   Moves extremities without difficulty  ?Other:   ? ?Medical Decision Making  ?Medically screening exam initiated at 3:19 PM.  Appropriate orders placed.  Alexis Tucker was informed that the remainder of the evaluation will be completed by another provider, this initial triage assessment does not replace that evaluation, and the importance of remaining in the ED until their evaluation is complete. ? ? ?  ?Alexis Guess, PA-C ?11/15/21 1521 ? ?

## 2021-11-15 NOTE — ED Notes (Signed)
Report received from Lindsay, RN

## 2021-11-15 NOTE — H&P (Addendum)
?History and Physical  ? ? ?Patient: Alexis Tucker LMB:[id number] DOB: 1941/02/15 ?DOA: 11/15/2021 ?DOS: the patient was seen and examined on 11/16/2021 ?PCP: Ria Bush, MD  ?Patient coming from:  Nephrologist office.  ? ?Chief Complaint:  ?Chief Complaint  ?Patient presents with  ? Tachycardia  ? ?HPI: Alexis Tucker is a 81 y.o. female with medical history significant of  ?Htn, ESRD and carotid stenosis,coming for tachycardia and atrial fibrillation.  ?Pt is asymptomatic and d/w her about risk of cva and daughter at bedside agrees with plan for heparin gtt. Pt does not report any other complaints.  ?Pt is alert and alert otherwise and admission requested for eval of a.fib rvr and possible PE.  ? ?Review of Systems  ?All other systems reviewed and are negative. ? ?Past Medical History:  ?Diagnosis Date  ? Anemia   ? per prior pcp  ? Breast cancer ([place]) 2008  ? F/U radiation   ? Breast cancer ([place]) 11/27/2017  ? mpT2 pN0 ER/PR positive, HER-2/neu negative.  2.5 cm maximum diameter, multifocal.  MammoSite  ? Carotid stenosis, asymptomatic 01/2014  ? mild ICA, severe in ECA, rec rpt [place] 2 years  ? Cataracts, bilateral   ? 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 Cr 3.85, in 3s since 2010, discussing HD and L forearm graft (Singh/Schnier)  ? Diabetes type 2, controlled ([place]) 2000s  ? Diabetic retinopathy with macular edema ([place]) 03/2014  ? [place] at [facility] eye on avastin  ? DJD (degenerative joint disease)   ? per prior pcp  ? GERD (gastroesophageal reflux disease)   ? Glaucoma 03/2014  ? History of breast cancer 2008  ? infiltrating ductal carcinoma s/p mammosite  ? History of colon cancer 2006  ? s/p colectomy  ? History of hepatitis A 1990s  ? from food, resolved  ? HTN (hypertension)   ? Macular degeneration   ? wet R with vision loss, dry L; to see retina specialist  ? Neuropathy   ? per prior pcp  ? OA (osteoarthritis)   ? knees and hips  ? Osteonecrosis ([place])   ? hips?  ? Personal  history of radiation therapy 2008  ? F/U Left Breast Cancer  ? Personal history of radiation therapy 2019  ? mammosite left breast ca  ? PONV (postoperative nausea and vomiting)   ? Post-surgical hypothyroidism   ? Wears dentures   ? partial upper  ? ?Past Surgical History:  ?Procedure Laterality Date  ? ABDOMINAL HYSTERECTOMY  1976  ? partial, after tubal pregnancy, h/o fibroids with heavy bleeding  ? BREAST BIOPSY Left 2008  ? positive  ? BREAST BIOPSY Left 11/22/2017  ? 2oclock 2cmfn wing shaped clip, INVASIVE MAMMARY CARCINOMA  ? BREAST BIOPSY Left 11/22/2017  ? 2oclock 4cmfn coil shaped clip, INVASIVE MAMMARY CARCINOMA  ? BREAST BIOPSY Left 11/22/2017  ? lymph node - butterfly hydroMARK  ? BREAST EXCISIONAL BIOPSY Left 01/07/2018  ? lumpectomy by Dr. Bary Castilla  ? BREAST LUMPECTOMY Left 2008  ? F/U radiation  ? BREAST LUMPECTOMY Left 2019  ? 2 areas of [place] at 2:00 position f/u with mammosite  ? BREAST LUMPECTOMY WITH SENTINEL LYMPH NODE BIOPSY Left 01/07/2018  ? Procedure: BREAST LUMPECTOMY WITH SENTINEL LYMPH NODE BX;  Surgeon: Robert Bellow, MD;  Location: [facility] ORS;  Service: General;  Laterality: Left;  ? BREAST MAMMOSITE  2008  ? carotid ultrasound  12/2012  ? 1-39% bilateral stenosis, severe in ECA  ? CATARACT  EXTRACTION W/PHACO Left 08/14/2016  ? Procedure: CATARACT EXTRACTION PHACO AND INTRAOCULAR LENS PLACEMENT (IOC)  Left Diabetic;  Surgeon: Ronnell Freshwater, MD;  Location: [place];  Service: Ophthalmology;  Laterality: Left;  Diabetic - oral meds  ? CHOLECYSTECTOMY  2005  ? COLECTOMY  2005  ? colon cancer  ? COLON SURGERY  2006  ? bowel obstruction  ? COLONOSCOPY  2008  ? ext hem, ileo-colonic anastomosis in cecum, tubular adenoma x1, rec rpt 1 yr for multiple polyps (in [place])  ? COLONOSCOPY WITH PROPOFOL N/A 10/19/2021  ? 2.5cm TVA with low grade dysplasia, diverticulosis, no rpt recommended Vicente Males, Bailey Mech, MD)  ? ESOPHAGOGASTRODUODENOSCOPY N/A 05/06/2016  ? Procedure:  ESOPHAGOGASTRODUODENOSCOPY (EGD);  Surgeon: Manya Silvas, MD;  Location: [facility] ENDOSCOPY;  Service: Endoscopy;  Laterality: N/A;  ? EYE SURGERY    ? JOINT REPLACEMENT    ? THYROIDECTOMY, PARTIAL    ? unclear why  ? TOTAL KNEE ARTHROPLASTY Right 1999  ? US ECHOCARDIOGRAPHY  11/2007  ? nl LV fxn EF 24%, diastolic dysfunction, LVH, tr TR and MR, slcerotic aortic valve  ? ?Social History:  reports that she has quit smoking. She has never used smokeless tobacco. She reports that she does not drink alcohol and does not use drugs. ? ?Allergies  ?Allergen Reactions  ? Tramadol Other (See Comments)  ?  nightmares  ? Codeine Anxiety  ? Sulfa Antibiotics Itching and Rash  ? ? ?Family History  ?Problem Relation Age of Onset  ? Diabetes Mother   ? Hypertension Mother   ? Arthritis Mother   ? Alcohol abuse Father   ? Arthritis Father   ? Stroke Daughter   ? CAD Neg Hx   ? Cancer Neg Hx   ? Breast cancer Neg Hx   ? ? ?Prior to Admission medications   ?Medication Sig Start Date End Date Taking? Authorizing Provider  ?acetaminophen (TYLENOL) 325 MG tablet Take 1 tablet (325 mg total) by mouth every 6 (six) hours as needed for mild pain (or Fever >/= 101). 08/27/17   Ria Bush, MD  ?Acetylcysteine 600 MG CAPS Take by mouth daily.    [provider]  ?allopurinol (ZYLOPRIM) 100 MG tablet Take 1 tablet (100 mg total) by mouth daily. 07/14/21   Ria Bush, MD  ?amLODipine (NORVASC) 5 MG tablet Take 5 mg by mouth daily. 09/15/21   [provider]  ?anastrozole (ARIMIDEX) 1 MG tablet Take 1 tablet (1 mg total) by mouth daily. 09/19/21   Ria Bush, MD  ?aspirin EC 81 MG EC tablet Take 1 tablet (81 mg total) by mouth daily. 05/09/16   Gladstone Lighter, MD  ?Blood Glucose Monitoring Suppl [facility] BLOOD GLUCOSE METER) W/DEVICE KIT Use as directed to check sugars daily 250.42 03/13/14   Ria Bush, MD  ?CALCIUM-MAGNESIUM PO Take 1 tablet by mouth daily.    [provider]  ?carvedilol  (COREG) 25 MG tablet Take 1 tablet (25 mg total) by mouth daily. 07/14/21   Ria Bush, MD  ?Cholecalciferol (VITAMIN D-3) 5000 UNITS TABS Take 1 tablet by mouth daily.    [provider]  ?diclofenac sodium (VOLTAREN) 1 % GEL APPLY 1 APPLICATION TOPICALLY 3 (THREE) TIMES DAILY. 10/30/17   Ria Bush, MD  ?ferrous sulfate (SLOW IRON) 160 (50 Fe) MG TBCR SR tablet Take 1 tablet (160 mg total) by mouth daily. 11/07/16   Ria Bush, MD  ?Folic Acid-Vit P8-KDX I33 (FOLBEE) 2.5-25-1 MG TABS tablet Take 1 tablet by mouth daily.  [provider]  ?isosorbide dinitrate (ISORDIL) 20 MG tablet Take 1 tablet (20 mg total) by mouth daily. 07/14/21   Ria Bush, MD  ?levothyroxine (SYNTHROID) 88 MCG tablet Take 1 tablet (88 mcg total) by mouth daily. 09/19/21   Ria Bush, MD  ?Netarsudil Dimesylate (RHOPRESSA) 0.02 % SOLN Apply to eye once a week.    [provider]  ?pantoprazole (PROTONIX) 40 MG tablet Take 1 tablet (40 mg total) by mouth daily as needed. 09/19/21   Ria Bush, MD  ?pentoxifylline (TRENTAL) 400 MG CR tablet Take 1 tablet (400 mg total) by mouth daily. 07/15/21   Ria Bush, MD  ?saxagliptin HCl (ONGLYZA) 2.5 MG TABS tablet Take 1 tablet (2.5 mg total) by mouth daily. 09/19/21   Ria Bush, MD  ?sodium bicarbonate 650 MG tablet Take 650 mg by mouth 2 (two) times daily as needed for heartburn.    [provider]  ? ? ?Physical Exam: ?Vitals:  ? 11/15/21 2300 11/16/21 0000 11/16/21 0100 11/16/21 0130  ?BP: (!) 171/85 (!) 185/80 (!) 169/83 (!) 150/83  ?Pulse: (!) 104 95 93 (!) 103  ?Resp: $Remov'15 17 16 11  'sSUCle$ ?Temp:      ?TempSrc:      ?SpO2: 100% 99% 100% 99%  ?Weight:      ?Height:      ?Physical Exam ?Vitals and nursing note reviewed.  ?Constitutional:   ?   General: She is not in acute distress. ?   Appearance: Normal appearance. She is not ill-appearing, toxic-appearing or diaphoretic.  ?HENT:  ?   Head: Normocephalic and  atraumatic.  ?   Right Ear: Hearing and external ear normal.  ?   Left Ear: Hearing and external ear normal.  ?   Nose: Nose normal. No nasal deformity.  ?   Mouth/Throat:  ?   Lips: Pink.  ?   Mouth: Mucous membranes are mois

## 2021-11-15 NOTE — Consult Note (Signed)
ANTICOAGULATION CONSULT NOTE - Initial Consult ? ?Pharmacy Consult for heparin ?Indication: atrial fibrillation ? ?Allergies  ?Allergen Reactions  ? Tramadol Other (See Comments)  ?  nightmares  ? Codeine Anxiety  ? Sulfa Antibiotics Itching and Rash  ? ? ?Patient Measurements: ?Height: 5' 4" (162.6 cm) ?Weight: 86.2 kg (190 lb) ?IBW/kg (Calculated) : 54.7 ?Heparin Dosing Weight: 73.7kg ? ?Vital Signs: ?Temp: 97.9 ?F (36.6 ?C) (05/16 1524) ?Temp Source: Oral (05/16 1524) ?BP: 171/95 (05/16 1900) ?Pulse Rate: 107 (05/16 1900) ? ?Labs: ?Recent Labs  ?  11/15/21 ?1522 11/15/21 ?1722  ?HGB 10.2*  --   ?HCT 34.1*  --   ?PLT 131*  --   ?CREATININE 4.72*  --   ?TROPONINIHS 25* 24*  ? ? ?Estimated Creatinine Clearance: 10.4 mL/min (A) (by C-G formula based on SCr of 4.72 mg/dL (H)). ? ? ?Medical History: ?Past Medical History:  ?Diagnosis Date  ? Anemia   ? per prior pcp  ? Breast cancer ([place]) 2008  ? F/U radiation   ? Breast cancer ([place]) 11/27/2017  ? mpT2 pN0 ER/PR positive, HER-2/neu negative.  2.5 cm maximum diameter, multifocal.  MammoSite  ? Carotid stenosis, asymptomatic 01/2014  ? mild ICA, severe in ECA, rec rpt [place] 2 years  ? Cataracts, bilateral   ? 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 Cr 3.85, in 3s since 2010, discussing HD and L forearm graft (Singh/Schnier)  ? Diabetes type 2, controlled ([place]) 2000s  ? Diabetic retinopathy with macular edema ([place]) 03/2014  ? [place] at [facility] eye on avastin  ? DJD (degenerative joint disease)   ? per prior pcp  ? GERD (gastroesophageal reflux disease)   ? Glaucoma 03/2014  ? History of breast cancer 2008  ? infiltrating ductal carcinoma s/p mammosite  ? History of colon cancer 2006  ? s/p colectomy  ? History of hepatitis A 1990s  ? from food, resolved  ? HTN (hypertension)   ? Macular degeneration   ? wet R with vision loss, dry L; to see retina specialist  ? Neuropathy   ? per prior pcp  ? OA (osteoarthritis)   ? knees and hips  ?  Osteonecrosis ([place])   ? hips?  ? Personal history of radiation therapy 2008  ? F/U Left Breast Cancer  ? Personal history of radiation therapy 2019  ? mammosite left breast ca  ? PONV (postoperative nausea and vomiting)   ? Post-surgical hypothyroidism   ? Wears dentures   ? partial upper  ? ? ?Medications:  ?PTA: N/A ?Inpatient: Heparin drip 5/16 >>> ?Allergies: No APT/AC related allergies ? ?Assessment: ?81 y.o. female with a past medical history of DM, OA, chronic back pain, colon cancer, hysterectomy, CVA, hypothyroidism, anemia of chronic disease, HTN and CKD who presents with Afib. Pharmacy consulted for heparin drip in the setting of atrial fibrillation ? ?Date Time aPTT/HL Rate/Comment ? ? ?Goal of Therapy:  ?Heparin level 0.3-0.7 units/ml ?Monitor platelets by anticoagulation protocol: Yes ?  ?Plan:  ?Give 4000 units bolus x1; then start heparin infusion at 1100 units/hr ?Check anti-Xa level in 8 hours and daily once consecutively therapeutic. ?Continue to monitor H&H and platelets daily while on heparin gtt. ? ? ?Darrick Penna ?11/15/2021,7:42 PM ? ? ?

## 2021-11-15 NOTE — ED Provider Notes (Signed)
? ?[facility] ?Provider Note ? ? ? None  ?  (approximate) ? ? ?History  ? ?Tachycardia ? ? ?HPI ? ?Marguita Venning is a 81 y.o. female with a past medical history of DM, OA, chronic back pain, colon cancer, hysterectomy, CVA, hypothyroidism, anemia of chronic disease, HTN and CKD who presents after being for to emergency room from nephrology clinic where she was seen for routine visit for evaluation of tachycardia.  Patient states she did not feel her heart rate was fast and did not realize it until it was checked at the nephrology office.  She has not had any fevers, chills, cough, nausea, vomiting, diarrhea, chest pain, abdominal pain or back pain.  No rash or extremity pain.  She does still make some urine.  He states she does not think they are planning to start her on dialysis anytime soon.  No tobacco use EtOH use or illicit drug use.  No recent falls.  No changes to her medications.  She does not recall any history of A-fib. ? ?On review of prior records it seems patient has a history of A-fib once after her procedure that was managed and packed with diltiazem but did not get any follow-up and was not subsequently managed or treated anyway. ?  ? ? ?Physical Exam  ?Triage Vital Signs: ?ED Triage Vitals  ?Enc Vitals Group  ?   BP 11/15/21 1524 (!) 135/95  ?   Pulse Rate 11/15/21 1524 (!) 120  ?   Resp 11/15/21 1524 18  ?   Temp 11/15/21 1524 97.9 ?F (36.6 ?C)  ?   Temp Source 11/15/21 1524 Oral  ?   SpO2 11/15/21 1524 99 %  ?   Weight 11/15/21 1520 190 lb (86.2 kg)  ?   Height 11/15/21 1520 '5\' 4"'$  (1.626 m)  ?   Head Circumference --   ?   Peak Flow --   ?   Pain Score 11/15/21 1520 0  ?   Pain Loc --   ?   Pain Edu? --   ?   Excl. in [place]? --   ? ? ?Most recent vital signs: ?Vitals:  ? 11/15/21 1830 11/15/21 1900  ?BP: (!) 154/77 (!) 171/95  ?Pulse: 98 (!) 107  ?Resp: 15 (!) 23  ?Temp:    ?SpO2: 100% 100%  ? ? ?General: Awake, no distress.  ?CV:  Good peripheral perfusion.  2+ radial pulses.   Tachycardic and regular. ?Resp:  Normal effort.  Clear bilaterally. ?Abd:  No distention.  Soft. ?Other:  No significant lower extremity edema. ? ? ?ED Results / Procedures / Treatments  ?Labs ?(all labs ordered are listed, but only abnormal results are displayed) ?Labs Reviewed  ?BASIC METABOLIC PANEL - Abnormal; Notable for the following components:  ?    Result Value  ? CO2 20 (*)   ? Glucose, Bld 123 (*)   ? BUN 39 (*)   ? Creatinine, Ser 4.72 (*)   ? GFR, Estimated 9 (*)   ? All other components within normal limits  ?CBC - Abnormal; Notable for the following components:  ? RBC 3.55 (*)   ? Hemoglobin 10.2 (*)   ? HCT 34.1 (*)   ? MCHC 29.9 (*)   ? Platelets 131 (*)   ? All other components within normal limits  ?D-DIMER, QUANTITATIVE - Abnormal; Notable for the following components:  ? D-Dimer, Quant 5.72 (*)   ? All other components within normal limits  ?TROPONIN  I (HIGH SENSITIVITY) - Abnormal; Notable for the following components:  ? Troponin I (High Sensitivity) 25 (*)   ? All other components within normal limits  ?TROPONIN I (HIGH SENSITIVITY) - Abnormal; Notable for the following components:  ? Troponin I (High Sensitivity) 24 (*)   ? All other components within normal limits  ?TSH  ?HEPATIC FUNCTION PANEL  ?MAGNESIUM  ?T4, FREE  ?APTT  ?CBC  ?HEPARIN LEVEL (UNFRACTIONATED)  ? ? ? ?EKG ? ?ECG is remarkable for A-fib with RVR and a ventricular rate of 120, normal axis, some nonspecific ST change in V2 without other clear evidence of acute ischemia or significant arrhythmia. ? ? ?RADIOLOGY ?Chest x-ray does show some central vascular congestion without overt edema, pneumothorax, effusion, focal consolidation or other acute process on my interpretation.  I also reviewed radiology's interpretation. ? ? ?PROCEDURES: ? ?Critical Care performed: Yes, see critical care procedure note(s) ? ?.1-3 Lead EKG Interpretation ?Performed by: Lucrezia Starch, MD ?Authorized by: Lucrezia Starch, MD  ? ?   Interpretation: non-specific   ?  ECG rate assessment: tachycardic   ?  Rhythm: atrial fibrillation   ?  Ectopy: none   ? ?The patient is on the cardiac monitor to evaluate for evidence of arrhythmia and/or significant heart rate changes. ? ? ?MEDICATIONS ORDERED IN ED: ?Medications  ?diltiazem (CARDIZEM) 1 mg/mL load via infusion 10 mg (has no administration in time range)  ?  And  ?diltiazem (CARDIZEM) 125 mg in dextrose 5% 125 mL (1 mg/mL) infusion (has no administration in time range)  ?heparin bolus via infusion 4,000 Units (has no administration in time range)  ?heparin ADULT infusion 100 units/mL (25000 units/255m) (has no administration in time range)  ? ? ? ?IMPRESSION / MDM / ASSESSMENT AND PLAN / ED COURSE  ?I reviewed the triage vital signs and the nursing notes. ?             ?               ? ?Differential diagnosis includes, but is not limited to A-fib related to hypovolemia, anemia, ischemia, other metabolic derangements and hypothyroidism.  I have a low suspicion for acute sympathomimetic ingestion or ethanol abuse. ? ?ECG is remarkable for A-fib with RVR and a ventricular rate of 120, normal axis, some nonspecific ST change in V2 without other clear evidence of acute ischemia or significant arrhythmia.  Troponins are slightly elevated but stable at 25 and 24.  I have a low suspicion for occlusion MI. ? ?Chest x-ray does show some central vascular congestion without overt edema, pneumothorax, effusion, focal consolidation or other acute process on my interpretation.  I also reviewed radiology's interpretation. ? ?CBC shows no leukocytosis and hemoglobin of 10.2 compared to 10.51-year ago.  Platelets are 131.  D-dimer is elevated at 5.73.  I will order nuclear medicine perfusion study to assess for evidence of PE.  TSH is within normal limits.  Free T4 is WNL. ? ?Given elevated D-dimer is somewhat difficult to exclude a possible PE.  Fortunately amenable to plan nuclear medicine study at this time  and given patient to make urine and is on dialysis I do not think benefits of obtaining a stat CTA with contrast outweigh potential risk to her kidneys.  I discussed with the patient and she is agreeable to staying for observation.  In the meantime I will start her on heparin with plan for her to undergo nuclear medicine study tomorrow morning.  She also  has a dill drip ordered for rate control.  I discussed this with admitting hospitalist. ? ?  ? ? ?FINAL CLINICAL IMPRESSION(S) / ED DIAGNOSES  ? ?Final diagnoses:  ?Atrial fibrillation with RVR ([place])  ?Positive D dimer  ?Troponin I above reference range  ? ? ? ?Rx / DC Orders  ? ?ED Discharge Orders   ? ? None  ? ?  ? ? ? ?Note:  This document was prepared using Dragon voice recognition software and may include unintentional dictation errors. ?  ?Lucrezia Starch, MD ?11/15/21 1954 ? ?

## 2021-11-15 NOTE — ED Notes (Signed)
This RN and Silvio Clayman attempted for IV with no success. IV team consult placed at this time.  ?

## 2021-11-16 ENCOUNTER — Inpatient Hospital Stay (HOSPITAL_COMMUNITY)
Admit: 2021-11-16 | Discharge: 2021-11-16 | Disposition: A | Discharge status: Home / Self Care | Payer: Medicare Other | Attending: Cardiology | Admitting: Cardiology

## 2021-11-16 ENCOUNTER — Observation Stay: Payer: Medicare Other

## 2021-11-16 ENCOUNTER — Encounter: Payer: Self-pay | Admitting: Internal Medicine

## 2021-11-16 DIAGNOSIS — N184 Chronic kidney disease, stage 4 (severe): Secondary | ICD-10-CM

## 2021-11-16 DIAGNOSIS — I480 Paroxysmal atrial fibrillation: Principal | ICD-10-CM

## 2021-11-16 DIAGNOSIS — R7989 Other specified abnormal findings of blood chemistry: Secondary | ICD-10-CM | POA: Diagnosis present

## 2021-11-16 DIAGNOSIS — R011 Cardiac murmur, unspecified: Secondary | ICD-10-CM

## 2021-11-16 DIAGNOSIS — Z90710 Acquired absence of both cervix and uterus: Secondary | ICD-10-CM | POA: Diagnosis not present

## 2021-11-16 DIAGNOSIS — Z823 Family history of stroke: Secondary | ICD-10-CM | POA: Diagnosis not present

## 2021-11-16 DIAGNOSIS — E872 Acidosis, unspecified: Secondary | ICD-10-CM | POA: Diagnosis present

## 2021-11-16 DIAGNOSIS — E1122 Type 2 diabetes mellitus with diabetic chronic kidney disease: Secondary | ICD-10-CM | POA: Diagnosis present

## 2021-11-16 DIAGNOSIS — N185 Chronic kidney disease, stage 5: Secondary | ICD-10-CM | POA: Diagnosis present

## 2021-11-16 DIAGNOSIS — Z8249 Family history of ischemic heart disease and other diseases of the circulatory system: Secondary | ICD-10-CM | POA: Diagnosis not present

## 2021-11-16 DIAGNOSIS — Z833 Family history of diabetes mellitus: Secondary | ICD-10-CM | POA: Diagnosis not present

## 2021-11-16 DIAGNOSIS — Z87891 Personal history of nicotine dependence: Secondary | ICD-10-CM | POA: Diagnosis not present

## 2021-11-16 DIAGNOSIS — I10 Essential (primary) hypertension: Secondary | ICD-10-CM | POA: Diagnosis not present

## 2021-11-16 DIAGNOSIS — I4891 Unspecified atrial fibrillation: Secondary | ICD-10-CM | POA: Diagnosis not present

## 2021-11-16 DIAGNOSIS — E669 Obesity, unspecified: Secondary | ICD-10-CM | POA: Diagnosis present

## 2021-11-16 DIAGNOSIS — K219 Gastro-esophageal reflux disease without esophagitis: Secondary | ICD-10-CM | POA: Diagnosis present

## 2021-11-16 DIAGNOSIS — Z85038 Personal history of other malignant neoplasm of large intestine: Secondary | ICD-10-CM | POA: Diagnosis not present

## 2021-11-16 DIAGNOSIS — E890 Postprocedural hypothyroidism: Secondary | ICD-10-CM | POA: Diagnosis present

## 2021-11-16 DIAGNOSIS — G8929 Other chronic pain: Secondary | ICD-10-CM | POA: Diagnosis present

## 2021-11-16 DIAGNOSIS — Z8673 Personal history of transient ischemic attack (TIA), and cerebral infarction without residual deficits: Secondary | ICD-10-CM | POA: Diagnosis not present

## 2021-11-16 DIAGNOSIS — E1169 Type 2 diabetes mellitus with other specified complication: Secondary | ICD-10-CM | POA: Diagnosis present

## 2021-11-16 DIAGNOSIS — Z79811 Long term (current) use of aromatase inhibitors: Secondary | ICD-10-CM | POA: Diagnosis not present

## 2021-11-16 DIAGNOSIS — R791 Abnormal coagulation profile: Secondary | ICD-10-CM | POA: Diagnosis not present

## 2021-11-16 DIAGNOSIS — Z7982 Long term (current) use of aspirin: Secondary | ICD-10-CM | POA: Diagnosis not present

## 2021-11-16 DIAGNOSIS — I350 Nonrheumatic aortic (valve) stenosis: Secondary | ICD-10-CM

## 2021-11-16 DIAGNOSIS — Z853 Personal history of malignant neoplasm of breast: Secondary | ICD-10-CM | POA: Diagnosis not present

## 2021-11-16 DIAGNOSIS — D631 Anemia in chronic kidney disease: Secondary | ICD-10-CM | POA: Diagnosis present

## 2021-11-16 DIAGNOSIS — D696 Thrombocytopenia, unspecified: Secondary | ICD-10-CM | POA: Diagnosis present

## 2021-11-16 DIAGNOSIS — R600 Localized edema: Secondary | ICD-10-CM | POA: Diagnosis not present

## 2021-11-16 DIAGNOSIS — I120 Hypertensive chronic kidney disease with stage 5 chronic kidney disease or end stage renal disease: Secondary | ICD-10-CM | POA: Diagnosis present

## 2021-11-16 DIAGNOSIS — Z923 Personal history of irradiation: Secondary | ICD-10-CM | POA: Diagnosis not present

## 2021-11-16 DIAGNOSIS — E11319 Type 2 diabetes mellitus with unspecified diabetic retinopathy without macular edema: Secondary | ICD-10-CM | POA: Diagnosis present

## 2021-11-16 HISTORY — DX: Nonrheumatic aortic (valve) stenosis: I35.0

## 2021-11-16 LAB — CBC
HCT: 31.9 % — ABNORMAL LOW (ref 36.0–46.0)
Hemoglobin: 9.6 g/dL — ABNORMAL LOW (ref 12.0–15.0)
MCH: 29.4 pg (ref 26.0–34.0)
MCHC: 30.1 g/dL (ref 30.0–36.0)
MCV: 97.6 fL (ref 80.0–100.0)
Platelets: 116 10*3/uL — ABNORMAL LOW (ref 150–400)
RBC: 3.27 MIL/uL — ABNORMAL LOW (ref 3.87–5.11)
RDW: 14.6 % (ref 11.5–15.5)
WBC: 5.8 10*3/uL (ref 4.0–10.5)
nRBC: 0 % (ref 0.0–0.2)

## 2021-11-16 LAB — BASIC METABOLIC PANEL
Anion gap: 8 (ref 5–15)
BUN: 39 mg/dL — ABNORMAL HIGH (ref 8–23)
CO2: 19 mmol/L — ABNORMAL LOW (ref 22–32)
Calcium: 9.2 mg/dL (ref 8.9–10.3)
Chloride: 115 mmol/L — ABNORMAL HIGH (ref 98–111)
Creatinine, Ser: 4.78 mg/dL — ABNORMAL HIGH (ref 0.44–1.00)
GFR, Estimated: 9 mL/min — ABNORMAL LOW (ref >60)
Glucose, Bld: 112 mg/dL — ABNORMAL HIGH (ref 70–99)
Potassium: 4.6 mmol/L (ref 3.5–5.1)
Sodium: 142 mmol/L (ref 135–145)

## 2021-11-16 LAB — LIPID PANEL
Cholesterol: 177 mg/dL (ref 0–200)
HDL: 60 mg/dL (ref >40)
LDL Cholesterol: 104 mg/dL — ABNORMAL HIGH (ref 0–99)
Total CHOL/HDL Ratio: 3 RATIO
Triglycerides: 64 mg/dL (ref <150)
VLDL: 13 mg/dL (ref 0–40)

## 2021-11-16 LAB — ECHOCARDIOGRAM COMPLETE
AR max vel: 2.34 cm2
AV Area VTI: 2.52 cm2
AV Area mean vel: 2.14 cm2
AV Mean grad: 3.3 mmHg
AV Peak grad: 5.2 mmHg
Ao pk vel: 1.14 m/s
Area-P 1/2: 3.23 cm2
Height: 64 in
MV VTI: 2.2 cm2
S' Lateral: 2.3 cm
Weight: 3040 oz

## 2021-11-16 LAB — HEPARIN LEVEL (UNFRACTIONATED)
Heparin Unfractionated: 0.7 IU/mL (ref 0.30–0.70)
Heparin Unfractionated: 0.86 IU/mL — ABNORMAL HIGH (ref 0.30–0.70)

## 2021-11-16 LAB — CBG MONITORING, ED
Glucose-Capillary: 103 mg/dL — ABNORMAL HIGH (ref 70–99)
Glucose-Capillary: 100 mg/dL — ABNORMAL HIGH (ref 70–99)

## 2021-11-16 LAB — GLUCOSE, CAPILLARY
Glucose-Capillary: 159 mg/dL — ABNORMAL HIGH (ref 70–99)
Glucose-Capillary: 82 mg/dL (ref 70–99)

## 2021-11-16 MED ORDER — B COMPLEX-C PO TABS
1.0000 | ORAL_TABLET | Freq: Every day | ORAL | Status: DC
  Administered 2021-11-16 – 2021-11-17 (×2): 1 via ORAL
  Filled 2021-11-16 (×2): qty 1

## 2021-11-16 MED ORDER — TECHNETIUM TO 99M ALBUMIN AGGREGATED
4.0000 | Freq: Once | INTRAVENOUS | Status: AC | PRN
  Administered 2021-11-16: 4.37 via INTRAVENOUS

## 2021-11-16 MED ORDER — HYDRALAZINE HCL 25 MG PO TABS
25.0000 mg | ORAL_TABLET | Freq: Three times a day (TID) | ORAL | Status: DC
  Administered 2021-11-16 – 2021-11-17 (×3): 25 mg via ORAL
  Filled 2021-11-16 (×3): qty 1

## 2021-11-16 MED ORDER — PANTOPRAZOLE SODIUM 40 MG IV SOLR
40.0000 mg | Freq: Two times a day (BID) | INTRAVENOUS | Status: DC
  Administered 2021-11-16 – 2021-11-17 (×4): 40 mg via INTRAVENOUS
  Filled 2021-11-16 (×4): qty 10

## 2021-11-16 MED ORDER — FERROUS SULFATE 325 (65 FE) MG PO TABS
325.0000 mg | ORAL_TABLET | Freq: Two times a day (BID) | ORAL | Status: DC
  Administered 2021-11-16 – 2021-11-17 (×3): 325 mg via ORAL
  Filled 2021-11-16 (×3): qty 1

## 2021-11-16 NOTE — Assessment & Plan Note (Signed)
Lab Results  ?Component Value Date  ? CREATININE 4.72 (H) 11/15/2021  ? CREATININE 4.59 (HH) 06/11/2020  ? CREATININE 4.11 (H) 12/25/2018  ?Nephrology consult as needed. ?pt makes urine and avoid contrast.  ? ?

## 2021-11-16 NOTE — ED Notes (Signed)
Venipuncture attempt x 2 left wrist and right AC without success. Called another RN to come look. Pt tolerated well. ?

## 2021-11-16 NOTE — ED Notes (Signed)
Patient taken to nuclear medicine

## 2021-11-16 NOTE — Assessment & Plan Note (Signed)
Pt does not reprot any melena or BM changes or concern. ?Pt to f/u with her oncologist on routine followup and as needed. ?

## 2021-11-16 NOTE — Assessment & Plan Note (Signed)
Amlodipine d/c ?Started diltiazem po and cont coreg as well.  ? ?

## 2021-11-16 NOTE — Assessment & Plan Note (Signed)
Pt on glycemic protocol.  ?

## 2021-11-16 NOTE — ED Notes (Signed)
Patient is resting quietly with eyes closed, RR are even and unlabored. Heparin infusing without incidence. A-fib continues on the monitor. Call bell within reach and siderails up. No acute distress observed. ?

## 2021-11-16 NOTE — Plan of Care (Signed)
  Problem: Education: Goal: Knowledge of General Education information will improve Description: Including pain rating scale, medication(s)/side effects and non-pharmacologic comfort measures Outcome: Progressing   Problem: Coping: Goal: Level of anxiety will decrease Outcome: Progressing   Problem: Elimination: Goal: Will not experience complications related to bowel motility Outcome: Progressing Goal: Will not experience complications related to urinary retention Outcome: Progressing   Problem: Safety: Goal: Ability to remain free from injury will improve Outcome: Progressing   Problem: Skin Integrity: Goal: Risk for impaired skin integrity will decrease Outcome: Progressing   

## 2021-11-16 NOTE — Progress Notes (Signed)
?PROGRESS NOTE ?Alexis Tucker  XNA:[id number] DOB: 21-Apr-1941 DOA: 11/15/2021 ?PCP: Ria Bush, MD  ? ?Brief Narrative/Hospital Course: ?81 year old female with diabetes, OSA, hypertension, chronic back pain, history of colon cancer/hysterectomy, CVA, hypothyroidism, anemia of chronic disease, hypertension, chronic kidney disease V-baseline creatinine ~ 4.5, followed by nephrology sent to the ED from nephrology office due to elevated heart rate of 130, without chest pain shortness of breath fatigue weakness. ?She was seen in the ED, previously had episode of A-fib once after her procedure that was managed with diltiazem but did not have follow-up.  Work-up with chest x-ray some central vascular congestion EKG with A-fib with RVR, EKG some nonspecific ST changes in V2, troponin mildly elevated flat 25 >24 , elevated D-dimer 5.7 Labs with anemia CKD TSH normal Free T4 normal placed on Cardizem heparin infusion and admitted for further work-up, VQ scan was ordered.  ?  ?Subjective: ?Seen and examined this morning.  Resting comfortably has no new complaints.  No chest pain. ?  ?Assessment and Plan: ?Principal Problem: ?  Atrial fibrillation with rapid ventricular response ([place]) ?Active Problems: ?  Positive D dimer ?  Anemia ?  Post-surgical hypothyroidism ?  Type 2 diabetes mellitus with other specified complication ([place]) ?  HTN (hypertension) ?  History of colon cancer ?  GERD (gastroesophageal reflux disease) ?  CKD stage 5 due to type 2 diabetes mellitus ([place]) ?  ?New onset PAF W/ rvr: currently in NSR, continue. PO cardizem/home carvedilol.  Follow-up echocardiogram, cardiology consulted.  Remains on heparin drip plan to start NOAC twice a day at discharge possibly tomorrow if heart rate remains stable ? ?Positive D dimer: Unclear etiology could be in the setting of A-fib RVR multiple medical issues.  For completeness -obtained VQ scan-low probability for PE, duplex negative for DVT.   ? ?Anemia, likely from  chronic kidney disease: Stable.  Resume home iron, B12 folate.  Follow-up with nephrology monitor ? ?Hypothyroidism: With normal TSH and free T4 resume home Synthroid ? ?Type 2 diabetes mellitus with other specified complication: Hemoglobin A1c 5.7 back in March.  Stable CBC ? ?HTN: BP well  not well controlled will adjust meds adding hydralazine, remains on Cardizem and Coreg as #1 ? ?History of colon cancer-follow-up outpatient with oncology ? ?GERD-continue PPI ? ?CKD stage 5 due to type 2 diabetes mellitus, creatinine at baseline followed by nephrology ?Metabolic acidosis with CKD.  Bicarb 19-20.  Stable renal function. ? ?Class I Obesity:Patient's Body mass index is 32.61 kg/m?. : Will benefit with PCP follow-up, weight loss  healthy lifestyle and outpatient sleep evaluation. ? ? ?DVT prophylaxis: SCDs Start: 11/15/21 1959 ?Code Status:   Code Status: Full Code ?Family Communication: plan of care discussed with patient at bedside. ?Patient status is: Admitted as observation but remains hospitalized for ongoing work-up of new onset A-fib with further cardiac consult, echocardiogram, remains hospitalized for at least 2 midnight, to ensure heart rate remains stable overnight ?Level of care: Telemetry Cardiac  ? ?Dispo: The patient is from: home ?           Anticipated disposition: home in next 24 hours once cleared by cardiology ? ?Mobility Assessment (last 72 hours)   ? ? Mobility Assessment   ? ? [place] Name 11/16/21 0700  ?  ?  ?  ?  ? Does patient have an order for bedrest or is patient medically unstable No - Continue assessment      ? What is the highest level of mobility based on  the progressive mobility assessment? Level 5 (Walks with assist in room/hall) - Balance while stepping forward/back and can walk in room with assist - Complete      ? ?  ?  ? ?  ?  ? ?Objective: ?Vitals last 24 hrs: ?Vitals:  ? 11/16/21 0630 11/16/21 0700 11/16/21 0930 11/16/21 1108  ?BP: (!) 163/61 (!) 164/78 (!) 145/61 119/69   ?Pulse: 79 77 74   ?Resp: 14 15 (!) 21 15  ?Temp:      ?TempSrc:      ?SpO2: 100% 100% 100%   ?Weight:      ?Height:      ? ?Weight change:  ? ?Physical Examination: ?General exam: alert awake,older than stated age, weak appearing. ?HEENT:Oral mucosa moist, Ear/Nose WNL grossly, dentition normal. ?Respiratory system: bilaterally clear BS, no use of accessory muscle ?Cardiovascular system: S1 & S2 +, No JVD.  Murmur present ?Gastrointestinal system: Abdomen soft,NT,ND, BS+ ?Nervous System:Alert, awake, moving extremities and grossly nonfocal ?Extremities: LE edema none,distal peripheral pulses palpable.  ?Skin: No rashes,no icterus. ?MSK: Normal muscle bulk,tone, power ? ?Medications reviewed:  ?Scheduled Meds: ? allopurinol  100 mg Oral Daily  ? anastrozole  1 mg Oral Daily  ? B-complex with vitamin C  1 tablet Oral Daily  ? carvedilol  25 mg Oral Daily  ? diltiazem  30 mg Oral Q6H  ? ferrous sulfate  325 mg Oral BID WC  ? hydrALAZINE  25 mg Oral Q8H  ? insulin aspart  0-15 Units Subcutaneous TID WC  ? levothyroxine  88 mcg Oral Q0600  ? pantoprazole (PROTONIX) IV  40 mg Intravenous Q12H  ? pentoxifylline  400 mg Oral Q breakfast  ? ?Continuous Infusions: ? heparin 1,100 Units/hr (11/16/21 1015)  ? ? ?  ?Diet Order   ? ?       ?  Diet Carb Modified Fluid consistency: Thin; Room service appropriate? Yes  Diet effective now       ?  ? ?  ?  ? ?  ?  ? ?  ?  ?  ? ?No intake or output data in the 24 hours ending 11/16/21 1143 ?Net IO Since Admission: No IO data has been entered for this period [11/16/21 1143]  ?Wt Readings from Last 3 Encounters:  ?2021/12/03 86.2 kg  ?10/19/21 90.3 kg  ?10/06/21 84.8 kg  ?  ? ?Unresulted Labs (From admission, onward)  ? ?  Start     Ordered  ? 11/16/21 1300  Heparin level (unfractionated)  Once-Timed,   R       ? 11/16/21 0641  ? Unscheduled  Occult blood card to lab, stool RN will collect  As needed,   R     ?Question:  Specimen to be collected by:  Answer:  RN will collect  ? 11/16/21  0159  ? ?  ?  ? ?  ?Data Reviewed: I have personally reviewed following labs and imaging studies ?CBC: ?Recent Labs  ?Lab 12/03/21 ?1522 11/16/21 ?0504  ?WBC 5.5 5.8  ?HGB 10.2* 9.6*  ?HCT 34.1* 31.9*  ?MCV 96.1 97.6  ?PLT 131* 116*  ? ?Basic Metabolic Panel: ?Recent Labs  ?Lab Dec 03, 2021 ?1522 03-Dec-2021 ?1722 11/16/21 ?0504  ?NA 142  --  142  ?K 5.0  --  4.6  ?CL 111  --  115*  ?CO2 20*  --  19*  ?GLUCOSE 123*  --  112*  ?BUN 39*  --  39*  ?CREATININE 4.72*  --  4.78*  ?  CALCIUM 9.4  --  9.2  ?MG  --  1.7  --   ? ?GFR: ?Estimated Creatinine Clearance: 10.3 mL/min (A) (by C-G formula based on SCr of 4.78 mg/dL (H)). ?Liver Function Tests: ?Recent Labs  ?Lab 11/15/21 ?1722  ?AST 15  ?ALT 10  ?ALKPHOS 70  ?BILITOT 0.5  ?PROT 7.5  ?ALBUMIN 3.8  ? ?No results for input(s): LIPASE, AMYLASE in the last 168 hours. ?No results for input(s): AMMONIA in the last 168 hours. ?Coagulation Profile: ?No results for input(s): INR, PROTIME in the last 168 hours. ?BNP (last 3 results) ?No results for input(s): PROBNP in the last 8760 hours. ?HbA1C: ?No results for input(s): HGBA1C in the last 72 hours. ?CBG: ?Recent Labs  ?Lab 11/15/21 ?2317 11/16/21 ?0732  ?GLUCAP 142* 103*  ? ?Lipid Profile: ?Recent Labs  ?  11/16/21 ?8242  ?CHOL 177  ?HDL 60  ?LDLCALC 104*  ?TRIG 64  ?CHOLHDL 3.0  ? ?Thyroid Function Tests: ?Recent Labs  ?  11/15/21 ?1522 11/15/21 ?1722  ?TSH 2.284  --   ?FREET4  --  1.09  ? ?Sepsis Labs: ?No results for input(s): PROCALCITON, LATICACIDVEN in the last 168 hours. ? ?No results found for this or any previous visit (from the past 240 hour(s)).  ?Antimicrobials: ?Anti-infectives (From admission, onward)  ? ? None  ? ?  ? ?Culture/Microbiology ?No results found for: SDES, [place], [place], REPTSTATUS  ?Other culture-see note  ?Radiology Studies: ?DG Chest 2 View ? ?Result Date: 11/15/2021 ?CLINICAL DATA:  Tachycardia that began this morning in a 81 year old female. EXAM: CHEST - 2 VIEW COMPARISON:  May 04, 2016.  FINDINGS: Cardiomediastinal contours and hilar structures with central pulmonary vascular engorgement suggested. Mild rotation to the RIGHT, accounting for this heart size is stable. Lungs are clear. Subtle nodular

## 2021-11-16 NOTE — Assessment & Plan Note (Signed)
Oral; anticoagulation as deemed appropriate pt is anemic and we will obtain guaiac and decide on t/t plan for a.fib. ?Pt is elderly and also has risk of fall and bleed.  ?

## 2021-11-16 NOTE — Consult Note (Signed)
ANTICOAGULATION CONSULT NOTE  ? ?Pharmacy Consult for heparin ?Indication: atrial fibrillation ? ?Allergies  ?Allergen Reactions  ? Tramadol Other (See Comments)  ?  nightmares  ? Codeine Anxiety  ? Sulfa Antibiotics Itching and Rash  ? ? ?Patient Measurements: ?Height: 5\' 4"  (162.6 cm) ?Weight: 86.2 kg (190 lb) ?IBW/kg (Calculated) : 54.7 ?Heparin Dosing Weight: 73.7kg ? ?Vital Signs: ?BP: 163/61 (05/17 0630) ?Pulse Rate: 79 (05/17 0630) ? ?Labs: ?Recent Labs  ?  11/15/21 ?1522 11/15/21 ?1722 11/15/21 ?2313 11/16/21 ?0504  ?HGB 10.2*  --   --   --   ?HCT 34.1*  --   --   --   ?PLT 131*  --   --   --   ?APTT  --   --  128*  --   ?HEPARINUNFRC  --   --   --  0.70  ?CREATININE 4.72*  --   --  4.78*  ?TROPONINIHS 25* 24*  --   --   ? ? ? ?Estimated Creatinine Clearance: 10.3 mL/min (A) (by C-G formula based on SCr of 4.78 mg/dL (H)). ? ? ?Medical History: ?Past Medical History:  ?Diagnosis Date  ? Anemia   ? per prior pcp  ? Breast cancer ([place]) 2008  ? F/U radiation   ? Breast cancer ([place]) 11/27/2017  ? mpT2 pN0 ER/PR positive, HER-2/neu negative.  2.5 cm maximum diameter, multifocal.  MammoSite  ? Carotid stenosis, asymptomatic 01/2014  ? mild ICA, severe in ECA, rec rpt [place] 2 years  ? Cataracts, bilateral   ? 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 Cr 3.85, in 3s since 2010, discussing HD and L forearm graft (Singh/Schnier)  ? Diabetes type 2, controlled ([place]) 2000s  ? Diabetic retinopathy with macular edema ([place]) 03/2014  ? [place] at [facility] eye on avastin  ? DJD (degenerative joint disease)   ? per prior pcp  ? GERD (gastroesophageal reflux disease)   ? Glaucoma 03/2014  ? History of breast cancer 2008  ? infiltrating ductal carcinoma s/p mammosite  ? History of colon cancer 2006  ? s/p colectomy  ? History of hepatitis A 1990s  ? from food, resolved  ? HTN (hypertension)   ? Macular degeneration   ? wet R with vision loss, dry L; to see retina specialist  ? Neuropathy   ? per prior  pcp  ? OA (osteoarthritis)   ? knees and hips  ? Osteonecrosis ([place])   ? hips?  ? Personal history of radiation therapy 2008  ? F/U Left Breast Cancer  ? Personal history of radiation therapy 2019  ? mammosite left breast ca  ? PONV (postoperative nausea and vomiting)   ? Post-surgical hypothyroidism   ? Wears dentures   ? partial upper  ? ? ?Medications:  ?PTA: N/A ?Inpatient: Heparin drip 5/16 >>> ?Allergies: No APT/AC related allergies ? ?Assessment: ?81 y.o. female with a past medical history of DM, OA, chronic back pain, colon cancer, hysterectomy, CVA, hypothyroidism, anemia of chronic disease, HTN and CKD who presents with Afib. Pharmacy consulted for heparin drip in the setting of atrial fibrillation ? ?Date Time aPTT/HL Rate/Comment ?5/17 0504 HL 0.70 Therapeutic x 1 ? ?Goal of Therapy:  ?Heparin level 0.3-0.7 units/ml ?Monitor platelets by anticoagulation protocol: Yes ?  ?Plan:  ?Continue heparin infusion at 1100 units/hr ?Recheck HL in 8 hours to confirm then daily once consecutively therapeutic. ?Continue to monitor H&H and platelets daily while on heparin gtt. ? ?Alver Sorrow.  Claybon Jabs, PharmD, MBA ?11/16/2021 ?6:41 AM ? ? ? ?

## 2021-11-16 NOTE — Consult Note (Signed)
? ? ? ?Cardiology Consultation:  ? ?Patient ID: Alexis Tucker; [id number]; April 24, 1941  ? ?Admit date: 11/15/2021 ?Date of Consult: 11/16/2021 ? ?Primary Care Provider: Ria Bush, MD ?Primary Cardiologist: [facility] - consult by [place] ?Primary Electrophysiologist:  None ? ? ?Patient Profile:  ? ?Alexis Tucker is a 81 y.o. 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 Lupita Leash. ? ?History of Present Illness:  ? ?Ms. Brittle has history of lone Afib documented followed MRCP/EGD in 05/2016 with spontaneous conversion to sinus rhythm during admission.  She was not felt to be a candidate for [facility] at that time due to multiple duodenal ulcers.  Echo demonstrated an EF of 60-65%, no RWMA, Gr1DD, mildly dilated left atrium, normal RVSF, and an PASP of 42 mmHg.  She has been lost to follow up since. ? ?She presented to her nephrologist's office on 5/16 for routine follow up and was without complaints. She was noted to be tachycardic in their office with rates in the 130s bpm. In this setting, she was sent to the ED. ? ?Upon her arrival to the [facility] ED, she was noted to be in Afib/flutter with RVR with ventricular rates in the 120s to 130s bpm.  Vitals stable with BP in the 130s to the 180s mmHg. Labs notable for HS-Tn 25 with a delta troponin of 24, D-dimer 5.72, potassium 5.0-->4.6, BUN/SCr 39/4.72, HGB 10.2, PLT 131, magnesium 1.7, TSH and free T4 normal. CXR with central pulmonary vascular congestion, otherwise without acute process.  VQ scan low probability for PE. She was placed on short-acting diltiazem 30 mg every 6 hours (note indicates IV diltiazem was not available) and placed on a heparin gtt.   Currently, asymptomatic and maintaining sinus rhythm. She denies and angina, dyspnea, palpitations, dizziness, presyncope, or syncope. She did have one mechanical fall several months ago in the context of GI illness. No symptoms of bleeding.  ? ? ?Past Medical History:  ?Diagnosis Date  ? Anemia   ? per prior pcp  ? Breast cancer ([place]) 2008  ? F/U radiation   ? Breast cancer ([place]) 11/27/2017  ? mpT2 pN0 ER/PR positive, HER-2/neu negative.  2.5 cm maximum diameter, multifocal.  MammoSite  ? Carotid stenosis, asymptomatic 01/2014  ? mild ICA, severe in ECA, rec rpt [place] 2 years  ? Cataracts, bilateral   ? 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 Cr 3.85, in 3s since 2010, discussing HD and L forearm graft (Singh/Schnier)  ? Diabetes type 2, controlled ([place]) 2000s  ? Diabetic retinopathy with macular edema ([place]) 03/2014  ? [place] at [facility] eye on avastin  ? DJD (degenerative joint disease)   ? per prior pcp  ? GERD (gastroesophageal reflux disease)   ? Glaucoma 03/2014  ? History of breast cancer 2008  ? infiltrating ductal carcinoma s/p mammosite  ? History of colon cancer 2006  ? s/p colectomy  ? History of hepatitis A 1990s  ? from food, resolved  ? HTN (hypertension)   ? Macular degeneration   ? wet R with vision loss, dry L; to see retina specialist  ? Neuropathy   ? per prior  pcp  ? OA (osteoarthritis)   ? knees and hips  ? Osteonecrosis ([place])   ? hips?  ? Personal history of radiation therapy 2008  ? F/U Left Breast Cancer  ? Personal history of radiation therapy 2019  ? mammosite left breast ca  ? PONV (postoperative nausea and vomiting)   ? Post-surgical hypothyroidism   ? Wears dentures   ? partial upper  ? ? ?Past Surgical History:  ?Procedure Laterality Date  ? ABDOMINAL HYSTERECTOMY  1976  ? partial, after tubal pregnancy, h/o fibroids with heavy bleeding  ? BREAST BIOPSY Left 2008  ? positive  ? BREAST BIOPSY Left 11/22/2017  ? 2oclock 2cmfn wing shaped clip, INVASIVE MAMMARY  CARCINOMA  ? BREAST BIOPSY Left 11/22/2017  ? 2oclock 4cmfn coil shaped clip, INVASIVE MAMMARY CARCINOMA  ? BREAST BIOPSY Left 11/22/2017  ? lymph node - butterfly hydroMARK  ? BREAST EXCISIONAL BIOPSY Left 01/07/2018  ? lumpectomy by Dr. Bary Castilla  ? BREAST LUMPECTOMY Left 2008  ? F/U radiation  ? BREAST LUMPECTOMY Left 2019  ? 2 areas of [place] at 2:00 position f/u with mammosite  ? BREAST LUMPECTOMY WITH SENTINEL LYMPH NODE BIOPSY Left 01/07/2018  ? Procedure: BREAST LUMPECTOMY WITH SENTINEL LYMPH NODE BX;  Surgeon: Robert Bellow, MD;  Location: [facility] ORS;  Service: General;  Laterality: Left;  ? BREAST MAMMOSITE  2008  ? carotid ultrasound  12/2012  ? 1-39% bilateral stenosis, severe in ECA  ? CATARACT EXTRACTION W/PHACO Left 08/14/2016  ? Procedure: CATARACT EXTRACTION PHACO AND INTRAOCULAR LENS PLACEMENT (IOC)  Left Diabetic;  Surgeon: Ronnell Freshwater, MD;  Location: [facility];  Service: Ophthalmology;  Laterality: Left;  Diabetic - oral meds  ? CHOLECYSTECTOMY  2005  ? COLECTOMY  2005  ? colon cancer  ? COLON SURGERY  2006  ? bowel obstruction  ? COLONOSCOPY  2008  ? ext hem, ileo-colonic anastomosis in cecum, tubular adenoma x1, rec rpt 1 yr for multiple polyps (in [place])  ? COLONOSCOPY WITH PROPOFOL N/A 10/19/2021  ? 2.5cm TVA with low grade dysplasia, diverticulosis, no rpt recommended Vicente Males, Bailey Mech, MD)  ? ESOPHAGOGASTRODUODENOSCOPY N/A 05/06/2016  ? Procedure: ESOPHAGOGASTRODUODENOSCOPY (EGD);  Surgeon: Manya Silvas, MD;  Location: [facility] END[facility]COPY;  Service: Endoscopy;  Laterality: N/A;  ? EYE SURGERY    ? JOINT REPLACEMENT    ? THYROIDECTOMY, PARTIAL    ? unclear why  ? TOTAL KNEE ARTHROPLASTY Right 1999  ? US ECHOCARDIOGRAPHY  11/2007  ? nl LV fxn EF 87%, diastolic dysfunction, LVH, tr TR and MR, slcerotic aortic valve  ?  ? ?Home Meds: ?Prior to Admission medications   ?Medication Sig Start Date End Date Taking? Authorizing Provider  ?acetaminophen (TYLENOL) 325 MG tablet Take 1  tablet (325 mg total) by mouth every 6 (six) hours as needed for mild pain (or Fever >/= 101). 08/27/17  Yes Ria Bush, MD  ?Acetylcysteine 600 MG CAPS Take by mouth daily.   Yes [provider]  ?allopurinol (ZYLOPRIM) 100 MG tablet Take 1 tablet (100 mg total) by mouth daily. 07/14/21  Yes Ria Bush, MD  ?amLODipine (NORVASC) 5 MG tablet Take 5 mg by mouth daily. 09/15/21  Yes [provider]  ?anastrozole (ARIMIDEX) 1 MG tablet Take 1 tablet (1 mg total) by mouth daily. 09/19/21  Yes Ria Bush, MD  ?aspirin EC 81 MG EC tablet Take 1 tablet (81 mg total) by mouth daily. 05/09/16  Yes Gladstone Lighter, MD  ?Blood Glucose Monitoring Suppl [facility] BLOOD  GLUCOSE METER) W/DEVICE KIT Use as directed to check sugars daily 250.42 03/13/14  Yes Ria Bush, MD  ?CALCIUM-MAGNESIUM PO Take 1 tablet by mouth daily.   Yes [provider]  ?carvedilol (COREG) 25 MG tablet Take 1 tablet (25 mg total) by mouth daily. 07/14/21  Yes Ria Bush, MD  ?Cholecalciferol (VITAMIN D-3) 5000 UNITS TABS Take 1 tablet by mouth daily.   Yes [provider]  ?ferrous sulfate (SLOW IRON) 160 (50 Fe) MG TBCR SR tablet Take 1 tablet (160 mg total) by mouth daily. 11/07/16  Yes Ria Bush, MD  ?Folic Acid-Vit H0-ITU Y29 (FOLBEE) 2.5-25-1 MG TABS tablet Take 1 tablet by mouth daily.   Yes [provider]  ?isosorbide dinitrate (ISORDIL) 20 MG tablet Take 1 tablet (20 mg total) by mouth daily. 07/14/21  Yes Ria Bush, MD  ?levothyroxine (SYNTHROID) 88 MCG tablet Take 1 tablet (88 mcg total) by mouth daily. 09/19/21  Yes Ria Bush, MD  ?pantoprazole (PROTONIX) 40 MG tablet Take 1 tablet (40 mg total) by mouth daily as needed. 09/19/21  Yes Ria Bush, MD  ?pentoxifylline (TRENTAL) 400 MG CR tablet Take 1 tablet (400 mg total) by mouth daily. 07/15/21  Yes Ria Bush, MD  ?saxagliptin HCl (ONGLYZA) 2.5 MG TABS tablet Take 1 tablet (2.5 mg total)  by mouth daily. 09/19/21  Yes Ria Bush, MD  ?diclofenac sodium (VOLTAREN) 1 % GEL APPLY 1 APPLICATION TOPICALLY 3 (THREE) TIMES DAILY. ?Patient not taking: Reported on 11/15/2021 10/30/17   Danise Mina,

## 2021-11-16 NOTE — ED Notes (Signed)
[place] at bedside for ECHO unable to draw hep level at this time ?

## 2021-11-16 NOTE — Assessment & Plan Note (Signed)
Pt is having afib rvr. ?Started on diltiazem po as they did not have drips. ?We will admit to cardiac tele unit. ?Currently on heparin gtt. ? ?

## 2021-11-16 NOTE — Assessment & Plan Note (Signed)
Pt has anemia of ckd and also has h/o  Colon cancer s/p colectomy.  ?Pt to f/u with her hematology for treatment of her anemia.  ?And gi for her routine cancer follow up.  ?

## 2021-11-16 NOTE — Assessment & Plan Note (Signed)
Cont on levothyroxine 88 mcg.  ?

## 2021-11-16 NOTE — Progress Notes (Signed)
*  PRELIMINARY RESULTS* ?Echocardiogram ?2D Echocardiogram has been performed. ? ?Alexis Tucker, [name] ?11/16/2021, 1:57 PM ?

## 2021-11-16 NOTE — ED Notes (Signed)
Pt assisted to the restroom

## 2021-11-16 NOTE — ED Notes (Signed)
Pt eating lunch with daughter at bedside.  ?

## 2021-11-16 NOTE — Assessment & Plan Note (Signed)
V/Q scan in am.  ?

## 2021-11-16 NOTE — Hospital Course (Addendum)
81 year old female with diabetes,OSA,hypertension, chronic back pain, history of colon cancer/hysterectomy, CVA, hypothyroidism, anemia of chronic disease, hypertension, chronic kidney disease V-baseline creatinine ~ 4.5,followed by nephrology sent to the ED from nephrology office due to elevated heart rate of 130, without chest pain shortness of breath fatigue weakness. She was seen in the ED, previously had episode of A-fib once after her procedure that was managed with diltiazem but did not have follow-up.Work-up with chest x-ray some central vascular congestion EKG with A-fib with RVR, EKG some nonspecific ST changes in V2,troponin mildly elevated flat 25 >24 , elevated D-dimer 5.7 Labs with anemia CKD TSH normal Free T4 normal placed on Cardizem heparin infusion and admitted for further work-up-VQ scan was ordered-and negative.Seen by cardiology- pt converted to normal sinus rhythm, given her high CHA2DS2-VASc score placed on anticoagulation, medication converted to carvedilol twice daily, at this time patient is stable for discharge home echocardiogram unremarkable.  She will follow-up with outpatient PCP for CBC and follow-up with cardiology as outpatient.

## 2021-11-16 NOTE — Assessment & Plan Note (Signed)
Iv ppi and prn Zofran.  ?

## 2021-11-16 NOTE — Consult Note (Signed)
ANTICOAGULATION CONSULT NOTE  ? ?Pharmacy Consult for IV Heparin ?Indication: atrial fibrillation ? ?Patient Measurements: ?Height: 5\' 4"  (162.6 cm) ?Weight: 86.2 kg (190 lb) ?IBW/kg (Calculated) : 54.7 ?Heparin Dosing Weight: 73.7kg ? ?Labs: ?Recent Labs  ?  11/15/21 ?1522 11/15/21 ?1722 11/15/21 ?2313 11/16/21 ?0504 11/16/21 ?1503  ?HGB 10.2*  --   --  9.6*  --   ?HCT 34.1*  --   --  31.9*  --   ?PLT 131*  --   --  116*  --   ?APTT  --   --  128*  --   --   ?HEPARINUNFRC  --   --   --  0.70 0.86*  ?CREATININE 4.72*  --   --  4.78*  --   ?TROPONINIHS 25* 24*  --   --   --   ? ? ? ?Estimated Creatinine Clearance: 10.3 mL/min (A) (by C-G formula based on SCr of 4.78 mg/dL (H)). ? ? ?Medical History: ?Past Medical History:  ?Diagnosis Date  ? Anemia   ? per prior pcp  ? Breast cancer ([place]) 2008  ? F/U radiation   ? Breast cancer ([place]) 11/27/2017  ? mpT2 pN0 ER/PR positive, HER-2/neu negative.  2.5 cm maximum diameter, multifocal.  MammoSite  ? Carotid stenosis, asymptomatic 01/2014  ? mild ICA, severe in ECA, rec rpt [place] 2 years  ? Cataracts, bilateral   ? 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 Cr 3.85, in 3s since 2010, discussing HD and L forearm graft (Singh/Schnier)  ? Diabetes type 2, controlled ([place]) 2000s  ? Diabetic retinopathy with macular edema ([place]) 03/2014  ? [place] at [facility] eye on avastin  ? DJD (degenerative joint disease)   ? per prior pcp  ? GERD (gastroesophageal reflux disease)   ? Glaucoma 03/2014  ? History of breast cancer 2008  ? infiltrating ductal carcinoma s/p mammosite  ? History of colon cancer 2006  ? s/p colectomy  ? History of hepatitis A 1990s  ? from food, resolved  ? HTN (hypertension)   ? Macular degeneration   ? wet R with vision loss, dry L; to see retina specialist  ? Neuropathy   ? per prior pcp  ? OA (osteoarthritis)   ? knees and hips  ? Osteonecrosis ([place])   ? hips?  ? Personal history of radiation therapy 2008  ? F/U Left Breast Cancer  ?  Personal history of radiation therapy 2019  ? mammosite left breast ca  ? PONV (postoperative nausea and vomiting)   ? Post-surgical hypothyroidism   ? Wears dentures   ? partial upper  ? ? ?Medications:  ?PTA: N/A ?Inpatient: Heparin drip 5/16 >>> ?Allergies: No APT/AC related allergies ? ?Assessment: ?81 y.o. female with a past medical history of DM, OA, chronic back pain, colon cancer, hysterectomy, CVA, hypothyroidism, anemia of chronic disease, HTN and CKD who presents with Afib. Pharmacy consulted for heparin drip in the setting of atrial fibrillation ? ?Date Time aPTT/HL Rate/Comment ?5/17 0504 HL 0.70 Therapeutic x 1 ?5/17 1503 HL 0.86 Supratherapeutic ? ?Goal of Therapy:  ?Heparin level 0.3-0.7 units/ml ?Monitor platelets by anticoagulation protocol: Yes ?  ?Plan:  ?--Heparin level is supratherapeutic ?--Decrease heparin infusion to 950 units/hr ?--Re-check HL 8 hours after rate change ?--Daily CBC per protocol while on IV heparin ? ?Benita Gutter  ?11/16/2021 ?4:19 PM ? ? ? ?

## 2021-11-17 ENCOUNTER — Telehealth: Payer: Self-pay | Admitting: Cardiology

## 2021-11-17 DIAGNOSIS — E1169 Type 2 diabetes mellitus with other specified complication: Secondary | ICD-10-CM

## 2021-11-17 DIAGNOSIS — N185 Chronic kidney disease, stage 5: Secondary | ICD-10-CM

## 2021-11-17 DIAGNOSIS — K219 Gastro-esophageal reflux disease without esophagitis: Secondary | ICD-10-CM

## 2021-11-17 DIAGNOSIS — I4891 Unspecified atrial fibrillation: Secondary | ICD-10-CM | POA: Diagnosis not present

## 2021-11-17 DIAGNOSIS — E1122 Type 2 diabetes mellitus with diabetic chronic kidney disease: Secondary | ICD-10-CM

## 2021-11-17 LAB — CBC
HCT: 30.4 % — ABNORMAL LOW (ref 36.0–46.0)
Hemoglobin: 9.5 g/dL — ABNORMAL LOW (ref 12.0–15.0)
MCH: 29.9 pg (ref 26.0–34.0)
MCHC: 31.3 g/dL (ref 30.0–36.0)
MCV: 95.6 fL (ref 80.0–100.0)
Platelets: 106 10*3/uL — ABNORMAL LOW (ref 150–400)
RBC: 3.18 MIL/uL — ABNORMAL LOW (ref 3.87–5.11)
RDW: 14.6 % (ref 11.5–15.5)
WBC: 6.7 10*3/uL (ref 4.0–10.5)
nRBC: 0 % (ref 0.0–0.2)

## 2021-11-17 LAB — GLUCOSE, CAPILLARY
Glucose-Capillary: 111 mg/dL — ABNORMAL HIGH (ref 70–99)
Glucose-Capillary: 171 mg/dL — ABNORMAL HIGH (ref 70–99)

## 2021-11-17 LAB — HEPARIN LEVEL (UNFRACTIONATED): Heparin Unfractionated: >1.1 IU/mL — ABNORMAL HIGH (ref 0.30–0.70)

## 2021-11-17 MED ORDER — APIXABAN 5 MG PO TABS
5.0000 mg | ORAL_TABLET | Freq: Two times a day (BID) | ORAL | 0 refills | Status: DC
Start: 2021-11-17 — End: 2021-11-25

## 2021-11-17 MED ORDER — APIXABAN 5 MG PO TABS
5.0000 mg | ORAL_TABLET | Freq: Two times a day (BID) | ORAL | Status: DC
  Administered 2021-11-17: 5 mg via ORAL
  Filled 2021-11-17: qty 1

## 2021-11-17 MED ORDER — CARVEDILOL 25 MG PO TABS: 25.0000 mg | ORAL_TABLET | Freq: Two times a day (BID) | ORAL | 0 refills | Status: DC

## 2021-11-17 MED ORDER — HYDRALAZINE HCL 50 MG PO TABS: 50.0000 mg | ORAL_TABLET | Freq: Three times a day (TID) | ORAL | Status: DC

## 2021-11-17 MED ORDER — ATORVASTATIN CALCIUM 80 MG PO TABS
80.0000 mg | ORAL_TABLET | Freq: Every day | ORAL | Status: DC
  Administered 2021-11-17: 80 mg via ORAL
  Filled 2021-11-17: qty 1

## 2021-11-17 MED ORDER — CARVEDILOL 25 MG PO TABS
25.0000 mg | ORAL_TABLET | Freq: Two times a day (BID) | ORAL | Status: DC
  Administered 2021-11-17: 25 mg via ORAL
  Filled 2021-11-17: qty 1

## 2021-11-17 MED ORDER — B COMPLEX-C PO TABS: 1.0000 | ORAL_TABLET | Freq: Every day | ORAL | 0 refills | Status: AC

## 2021-11-17 MED ORDER — HEPARIN (PORCINE) 25000 UT/250ML-% IV SOLN
750.0000 [IU]/h | INTRAVENOUS | Status: DC
  Administered 2021-11-17: 750 [IU]/h via INTRAVENOUS

## 2021-11-17 MED ORDER — PANTOPRAZOLE SODIUM 40 MG PO TBEC
40.0000 mg | DELAYED_RELEASE_TABLET | Freq: Two times a day (BID) | ORAL | Status: DC
Start: 2021-11-17 — End: 2021-11-17

## 2021-11-17 NOTE — Consult Note (Signed)
[place] for IV Heparin Indication: atrial fibrillation  Patient Measurements: Height: 5\' 4"  (162.6 cm) Weight: 86.2 kg (190 lb) IBW/kg (Calculated) : 54.7 Heparin Dosing Weight: 73.7kg  Labs: Recent Labs    11/15/21 1522 11/15/21 1722 11/15/21 2313 11/16/21 0504 11/16/21 1503 11/17/21 0203  HGB 10.2*  --   --  9.6*  --  9.5*  HCT 34.1*  --   --  31.9*  --  30.4*  PLT 131*  --   --  116*  --  106*  APTT  --   --  128*  --   --   --   HEPARINUNFRC  --   --   --  0.70 0.86* >1.10*  CREATININE 4.72*  --   --  4.78*  --   --   TROPONINIHS 25* 24*  --   --   --   --      Estimated Creatinine Clearance: 10.3 mL/min (A) (by C-G formula based on SCr of 4.78 mg/dL (H)).   Medical History: Past Medical History:  Diagnosis Date   Anemia    per prior pcp   Breast cancer ([place]) 2008   F/U radiation    Breast cancer ([place]) 11/27/2017   mpT2 pN0 ER/PR positive, HER-2/neu negative.  2.5 cm maximum diameter, multifocal.  MammoSite   Carotid stenosis, asymptomatic 01/2014   mild ICA, severe in ECA, rec rpt [place] 2 years   Cataracts, bilateral    CKD (chronic kidney disease) stage 4, GFR 15-29 ml/min ([place]) 01/2012   Cr 3.85, in 3s since 2010, discussing HD and L forearm graft (Singh/Schnier)   Diabetes type 2, controlled ([place]) 2000s   Diabetic retinopathy with macular edema ([place]) 03/2014   [place] at [facility] eye on avastin   DJD (degenerative joint disease)    per prior pcp   GERD (gastroesophageal reflux disease)    Glaucoma 03/2014   History of breast cancer 2008   infiltrating ductal carcinoma s/p mammosite   History of colon cancer 2006   s/p colectomy   History of hepatitis A 1990s   from food, resolved   HTN (hypertension)    Macular degeneration    wet R with vision loss, dry L; to see retina specialist   Neuropathy    per prior pcp   OA (osteoarthritis)    knees and hips   Osteonecrosis (HCC)    hips?   Personal history of  radiation therapy 2008   F/U Left Breast Cancer   Personal history of radiation therapy 2019   mammosite left breast ca   PONV (postoperative nausea and vomiting)    Post-surgical hypothyroidism    Wears dentures    partial upper    Medications:  PTA: N/A Inpatient: Heparin drip 5/16 >>> Allergies: No APT/AC related allergies  Assessment: 81 y.o. female with a past medical history of DM, OA, chronic back pain, colon cancer, hysterectomy, CVA, hypothyroidism, anemia of chronic disease, HTN and CKD who presents with Afib. Pharmacy consulted for heparin drip in the setting of atrial fibrillation  Date Time aPTT/HL Rate/Comment 5/17 0504 HL 0.70 Therapeutic x 1 5/17 1503 HL 0.86 Supratherapeutic 5/18 0203 HL >1.1 Supratherapeutic  Goal of Therapy:  Heparin level 0.3-0.7 units/ml Monitor platelets by anticoagulation protocol: Yes   Plan:  --Heparin level is supratherapeutic --Contacted RN, hold infusion for 1 hr. --Restart heparin infusion at 750 units/hr --Re-check HL 8 hours after restart --Daily CBC per protocol while on IV heparin  Renda Rolls, PharmD, [facility] 11/17/2021 2:45 AM

## 2021-11-17 NOTE — Progress Notes (Signed)
Progress Note  Patient Name: Alexis Tucker Date of Encounter: 11/17/2021  [facility] Cardiologist: [facility]  Subjective   Reports feeling well, no complaints, denies any tachypalpitations Family at the bedside, daughter Long discussion concerning medications Appears she is only taking carvedilol once a day, has only been receiving it once a day here and at home Telemetry reviewed, normal sinus rhythm  Echocardiogram reviewed normal ejection fraction, mild aortic valve stenosis2  Inpatient Medications    Scheduled Meds:  allopurinol  100 mg Oral Daily   anastrozole  1 mg Oral Daily   B-complex with vitamin C  1 tablet Oral Daily   carvedilol  25 mg Oral BID WC   ferrous sulfate  325 mg Oral BID WC   hydrALAZINE  50 mg Oral Q8H   insulin aspart  0-15 Units Subcutaneous TID WC   levothyroxine  88 mcg Oral Q0600   pantoprazole (PROTONIX) IV  40 mg Intravenous Q12H   pentoxifylline  400 mg Oral Q breakfast   Continuous Infusions:  heparin 750 Units/hr (11/17/21 0402)   PRN Meds: acetaminophen, ondansetron (ZOFRAN) IV   Vital Signs    Vitals:   11/16/21 1445 11/16/21 1937 11/16/21 2324 11/17/21 0316  BP: (!) 141/70 (!) 155/65 (!) 143/46 (!) 157/58  Pulse: 73 67 75 68  Resp: '19 16 18 16  '$ Temp: (!) 97.5 F (36.4 C) 97.9 F (36.6 C) 98.5 F (36.9 C) 98.6 F (37 C)  TempSrc:  Oral Axillary Axillary  SpO2: 100% 100% 100% 100%  Weight:      Height:       No intake or output data in the 24 hours ending 11/17/21 0902    11/15/2021    3:20 PM 10/19/2021   10:34 AM 10/06/2021    2:11 PM  Last 3 Weights  Weight (lbs) 190 lb 199 lb 187 lb  Weight (kg) 86.183 kg 90.266 kg 84.823 kg      Telemetry    Normal sinus rhythm- Personally Reviewed  ECG     - Personally Reviewed  Physical Exam   GEN: No acute distress.  Mild confusion Neck: No JVD Cardiac: RRR, no murmurs, rubs, or gallops.  Respiratory: Clear to auscultation bilaterally. GI: Soft,  nontender, non-distended  MS: No edema; No deformity. Neuro:  Nonfocal  Psych: Normal affect   Labs    High Sensitivity Troponin:   Recent Labs  Lab 11/15/21 1522 11/15/21 1722  TROPONINIHS 25* 24*     Chemistry Recent Labs  Lab 11/15/21 1522 11/15/21 1722 11/16/21 0504  NA 142  --  142  K 5.0  --  4.6  CL 111  --  115*  CO2 20*  --  19*  GLUCOSE 123*  --  112*  BUN 39*  --  39*  CREATININE 4.72*  --  4.78*  CALCIUM 9.4  --  9.2  MG  --  1.7  --   PROT  --  7.5  --   ALBUMIN  --  3.8  --   AST  --  15  --   ALT  --  10  --   ALKPHOS  --  70  --   BILITOT  --  0.5  --   GFRNONAA 9*  --  9*  ANIONGAP 11  --  8    Lipids  Recent Labs  Lab 11/16/21 0504  CHOL 177  TRIG 64  HDL 60  LDLCALC 104*  CHOLHDL 3.0    Hematology  Recent Labs  Lab 11/15/21 1522 11/16/21 0504 11/17/21 0203  WBC 5.5 5.8 6.7  RBC 3.55* 3.27* 3.18*  HGB 10.2* 9.6* 9.5*  HCT 34.1* 31.9* 30.4*  MCV 96.1 97.6 95.6  MCH 28.7 29.4 29.9  MCHC 29.9* 30.1 31.3  RDW 14.6 14.6 14.6  PLT 131* 116* 106*   Thyroid  Recent Labs  Lab 11/15/21 1522 11/15/21 1722  TSH 2.284  --   FREET4  --  1.09    BNPNo results for input(s): BNP, PROBNP in the last 168 hours.  DDimer  Recent Labs  Lab 11/15/21 1522  DDIMER 5.72*     Radiology    DG Chest 2 View  Result Date: 11/15/2021 CLINICAL DATA:  Tachycardia that began this morning in a 81 year old female. EXAM: CHEST - 2 VIEW COMPARISON:  May 04, 2016. FINDINGS: Cardiomediastinal contours and hilar structures with central pulmonary vascular engorgement suggested. Mild rotation to the RIGHT, accounting for this heart size is stable. Lungs are clear. Subtle nodular opacity over the RIGHT upper chest is likely an EKG leads sticker which is mainly radial lucent. No sign of pneumothorax. On limited assessment there is no acute skeletal finding. IMPRESSION: 1. Central pulmonary vascular engorgement without edema. 2. No acute cardiopulmonary  process. Electronically Signed   By: Zetta Bills M.D.   On: 11/15/2021 16:28   NM Pulmonary Perfusion  Result Date: 11/16/2021 CLINICAL DATA:  Pulmonary embolism (PE) suspected, unknown D-dimer EXAM: NUCLEAR MEDICINE PERFUSION LUNG SCAN TECHNIQUE: Perfusion images were obtained in multiple projections after intravenous injection of radiopharmaceutical. Ventilation scans intentionally deferred if perfusion scan and chest x-ray adequate for interpretation during COVID 19 epidemic. RADIOPHARMACEUTICALS:  4.37 mCi Tc-33mMAA IV COMPARISON:  Radiograph from previous day FINDINGS: Mildly heterogenous distribution of radiopharmaceutical without discrete segmental or subsegmental perfusion defect. IMPRESSION: Low likelihood ratio for pulmonary embolism. Electronically Signed   By: DLucrezia EuropeM.D.   On: 11/16/2021 08:41   UKoreaVenous Img Lower Bilateral (DVT)  Result Date: 11/16/2021 CLINICAL DATA:  Bilateral lower extremity edema. EXAM: BILATERAL LOWER EXTREMITY VENOUS DOPPLER ULTRASOUND TECHNIQUE: Gray-scale sonography with graded compression, as well as color Doppler and duplex ultrasound were performed to evaluate the lower extremity deep venous systems from the level of the common femoral vein and including the common femoral, femoral, profunda femoral, popliteal and calf veins including the posterior tibial, peroneal and gastrocnemius veins when visible. The superficial great saphenous vein was also interrogated. Spectral Doppler was utilized to evaluate flow at rest and with distal augmentation maneuvers in the common femoral, femoral and popliteal veins. COMPARISON:  None Available. FINDINGS: RIGHT LOWER EXTREMITY Common Femoral Vein: No evidence of thrombus. Normal compressibility, respiratory phasicity and response to augmentation. Saphenofemoral Junction: No evidence of thrombus. Normal compressibility and flow on color Doppler imaging. Profunda Femoral Vein: No evidence of thrombus. Normal  compressibility and flow on color Doppler imaging. Femoral Vein: No evidence of thrombus. Normal compressibility, respiratory phasicity and response to augmentation. Popliteal Vein: No evidence of thrombus. Normal compressibility, respiratory phasicity and response to augmentation. Calf Veins: No evidence of thrombus. Normal compressibility and flow on color Doppler imaging. LEFT LOWER EXTREMITY Common Femoral Vein: No evidence of thrombus. Normal compressibility, respiratory phasicity and response to augmentation. Saphenofemoral Junction: No evidence of thrombus. Normal compressibility and flow on color Doppler imaging. Profunda Femoral Vein: No evidence of thrombus. Normal compressibility and flow on color Doppler imaging. Femoral Vein: No evidence of thrombus. Normal compressibility, respiratory phasicity and response to augmentation. Popliteal Vein: No  evidence of thrombus. Normal compressibility, respiratory phasicity and response to augmentation. Calf Veins: No evidence of thrombus. Normal compressibility and flow on color Doppler imaging. IMPRESSION: No evidence of deep venous thrombosis in either lower extremity. Electronically Signed   By: Margaretha Sheffield M.D.   On: 11/16/2021 09:45   ECHOCARDIOGRAM COMPLETE  Result Date: 11/16/2021    ECHOCARDIOGRAM REPORT   Patient Name:   ARRYANA TOLLESON Date of Exam: 11/16/2021 Medical Rec #:  [id number]    Height:       64.0 in Accession #:    [id number]   Weight:       190.0 lb Date of Birth:  [birth year]    BSA:          1.914 m Patient Age:    75 years     BP:           116/87 mmHg Patient Gender: F            HR:           52 bpm. Exam Location:  [facility] Procedure: 2D Echo, Cardiac Doppler and Color Doppler Indications:     Atrial Fibrillation I48.91  History:         Patient has prior history of Echocardiogram examinations, most                  recent 05/04/2016. Risk Factors:Hypertension and Diabetes.                  Anemia, CKD.  Sonographer:     Sherrie Sport  Referring Phys:  [id number] Kate Sable Diagnosing Phys: Kate Sable MD IMPRESSIONS  1. Left ventricular ejection fraction, by estimation, is 55 to 60%. The left ventricle has normal function. The left ventricle has no regional wall motion abnormalities. There is mild left ventricular hypertrophy. Left ventricular diastolic parameters are consistent with Grade II diastolic dysfunction (pseudonormalization).  2. Right ventricular systolic function is normal. The right ventricular size is mildly enlarged.  3. Left atrial size was mild to moderately dilated.  4. The mitral valve is degenerative. Mild mitral valve regurgitation. Moderate mitral annular calcification.  5. The aortic valve is calcified. Aortic valve regurgitation is mild. Mild aortic valve stenosis. FINDINGS  Left Ventricle: Left ventricular ejection fraction, by estimation, is 55 to 60%. The left ventricle has normal function. The left ventricle has no regional wall motion abnormalities. The left ventricular internal cavity size was normal in size. There is  mild left ventricular hypertrophy. Left ventricular diastolic parameters are consistent with Grade II diastolic dysfunction (pseudonormalization). Right Ventricle: The right ventricular size is mildly enlarged. No increase in right ventricular wall thickness. Right ventricular systolic function is normal. Left Atrium: Left atrial size was mild to moderately dilated. Right Atrium: Right atrial size was normal in size. Pericardium: There is no evidence of pericardial effusion. Mitral Valve: The mitral valve is degenerative in appearance. There is mild calcification of the mitral valve leaflet(s). Moderate mitral annular calcification. Mild mitral valve regurgitation. MV peak gradient, 7.7 mmHg. The mean mitral valve gradient is 2.0 mmHg. Tricuspid Valve: The tricuspid valve is normal in structure. Tricuspid valve regurgitation is mild. Aortic Valve: The aortic valve is calcified. Aortic valve  regurgitation is mild. Mild aortic stenosis is present. Aortic valve mean gradient measures 3.3 mmHg. Aortic valve peak gradient measures 5.2 mmHg. Aortic valve area, by VTI measures 2.52 cm. Pulmonic Valve: The pulmonic valve was not well visualized. Pulmonic valve regurgitation is not visualized. Aorta: The aortic  root is normal in size and structure. Venous: The inferior vena cava was not well visualized. IAS/Shunts: No atrial level shunt detected by color flow Doppler.  LEFT VENTRICLE PLAX 2D LVIDd:         3.80 cm   Diastology LVIDs:         2.30 cm   LV e' medial:    6.74 cm/s LV PW:         1.20 cm   LV E/e' medial:  18.2 LV IVS:        1.00 cm   LV e' lateral:   6.85 cm/s LVOT diam:     2.00 cm   LV E/e' lateral: 18.0 LV SV:         65 LV SV Index:   34 LVOT Area:     3.14 cm  RIGHT VENTRICLE RV Basal diam:  4.00 cm LEFT ATRIUM             Index        RIGHT ATRIUM           Index LA diam:        4.50 cm 2.35 cm/m   RA Area:     17.50 cm LA Vol (A2C):   61.4 ml 32.08 ml/m  RA Volume:   46.70 ml  24.40 ml/m LA Vol (A4C):   77.2 ml 40.33 ml/m LA Biplane Vol: 72.0 ml 37.61 ml/m  AORTIC VALVE                    PULMONIC VALVE AV Area (Vmax):    2.34 cm     PV Vmax:        0.68 m/s AV Area (Vmean):   2.14 cm     PV Vmean:       47.200 cm/s AV Area (VTI):     2.52 cm     PV VTI:         0.143 m AV Vmax:           114.00 cm/s  PV Peak grad:   1.8 mmHg AV Vmean:          83.600 cm/s  PV Mean grad:   1.0 mmHg AV VTI:            0.257 m      RVOT Peak grad: 3 mmHg AV Peak Grad:      5.2 mmHg AV Mean Grad:      3.3 mmHg LVOT Vmax:         84.90 cm/s LVOT Vmean:        57.000 cm/s LVOT VTI:          0.206 m LVOT/AV VTI ratio: 0.80  AORTA Ao Root diam: 3.30 cm MITRAL VALVE                TRICUSPID VALVE MV Area (PHT): 3.23 cm     TR Peak grad:   15.7 mmHg MV Area VTI:   2.20 cm     TR Vmax:        198.00 cm/s MV Peak grad:  7.7 mmHg MV Mean grad:  2.0 mmHg     SHUNTS MV Vmax:       1.39 m/s     Systemic VTI:   0.21 m MV Vmean:      67.4 cm/s    Systemic Diam: 2.00 cm MV Decel Time: 235 msec     Pulmonic VTI:  0.147 m MV E  velocity: 123.00 cm/s MV A velocity: 65.60 cm/s MV E/A ratio:  1.88 Kate Sable MD Electronically signed by Kate Sable MD Signature Date/Time: 11/16/2021/5:02:02 PM    Final     Cardiac Studies   Echocardiogram   1. Left ventricular ejection fraction, by estimation, is 55 to 60%. The  left ventricle has normal function. The left ventricle has no regional  wall motion abnormalities. There is mild left ventricular hypertrophy.  Left ventricular diastolic parameters  are consistent with Grade II diastolic dysfunction (pseudonormalization).   2. Right ventricular systolic function is normal. The right ventricular  size is mildly enlarged.   3. Left atrial size was mild to moderately dilated.   4. The mitral valve is degenerative. Mild mitral valve regurgitation.  Moderate mitral annular calcification.   5. The aortic valve is calcified. Aortic valve regurgitation is mild.  Mild aortic valve stenosis.   Patient Profile     Alexis Tucker is a 81 y.o. 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, remote parietotemporal CVA, colon cancer s/p colectomy in 2005, breast cancer s/p lumpectomy and XRT, partial thyroidectomy, DM with diabetic retinopathy, anemia of chronic disease, HTN, and GERD who is being seen today for the evaluation of Afib with RVR   Assessment & Plan   Paroxysmal atrial fib flutter Diagnosed A-fib with RVR November 2017 following ERCP, not on anticoagulation secondary to prior diagnosis duodenal ulcers, spontaneously converted to normal sinus rhythm then lost to follow-up -Admission to the hospital Nov 15, 2021 asymptomatic A-fib flutter with RVR rate was 130s at nephrology -Converting to normal sinus rhythm on diltiazem --Recommend we increase Coreg up to 25 twice daily and hold the diltiazem -Given  paroxysmal nature of her atrial fibrillation, elevated CHA2DS2-VASc, would start Eliquis 5 twice daily (does not meet criteria for lower dose) --High risk of recurrent arrhythmia in the setting of end-stage renal disease, high risk of fluid retention  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 Followed by nephrology  Anemia of chronic disease/thrombocytopenia Hemoglobin 9.5 Trending downward over the past several years Outpatient work-up  Essential hypertension Has been elevated, trigger for atrial fibrillation Increase Coreg up to 25 twice daily Increase hydralazine 50 mg  3 times daily  Long discussion concerning reason for presentation, management, discharge medications and follow-up with patient and family at the bedside  Total encounter time more than 50 minutes  Greater than 50% was spent in counseling and coordination of care with the patient   For questions or updates, please contact [facility] Please consult [email or web address] for contact info under        Signed, Ida Rogue, MD  11/17/2021, 9:02 AM

## 2021-11-17 NOTE — Telephone Encounter (Signed)
-----   Message from Clarisse Gouge sent at 11/17/2021  9:30 AM EDT ----- Regarding: hospital fu [facility]  ----- Message ----- From: Minna Merritts, MD Sent: 11/17/2021   9:13 AM EDT To: Rebeca Alert [facility] Scheduling  Leaving the hospital today needs follow-up with app or [facility] Thx TG

## 2021-11-17 NOTE — Progress Notes (Signed)
PHARMACIST - PHYSICIAN COMMUNICATION  DR: Maren Beach   CONCERNING: IV to Oral Route Change Policy  RECOMMENDATION: This patient is receiving pantoprazole by the intravenous route.  Based on criteria approved by the Pharmacy and Therapeutics Committee, the intravenous medication(s) is/are being converted to the equivalent oral dose form(s).   DESCRIPTION: These criteria include: The patient is eating (either orally or via tube) and/or has been taking other orally administered medications for a least 24 hours The patient has no evidence of active gastrointestinal bleeding or impaired GI absorption (gastrectomy, short bowel, patient on TNA or NPO).  If you have questions about this conversion, please contact the Pharmacy Department  '[]'$   [phone] )  [facility] '[x]'$   [phone] )  [facility] '[]'$   [phone] )  [facility] '[]'$   [phone] )  [facility] '[]'$   [phone] )  [place], [facility] 11/17/2021 9:58 AM

## 2021-11-17 NOTE — Telephone Encounter (Signed)
LMOV  

## 2021-11-17 NOTE — Plan of Care (Signed)
  Problem: Health Behavior/Discharge Planning: Goal: Ability to manage health-related needs will improve Outcome: Adequate for Discharge  Discharged to go home under care of her daughter

## 2021-11-17 NOTE — TOC Initial Note (Signed)
Transition of Care [facility]) - Initial/Assessment Note    Patient Details  Name: Alexis Tucker MRN: [id number] Date of Birth: [birth year]  [facility]) CM/SW Contact:    Laurena Slimmer, RN Phone Number: 11/17/2021, 10:43 AM  Clinical Narrative:                  Transition of Care [facility]) Screening Note   Patient Details  Name: Alexis Tucker Date of Birth: Apr 18, 1941   Transition of Care [facility]) CM/SW Contact:    Laurena Slimmer, RN Phone Number: 11/17/2021, 10:43 AM    Transition of Care Department (TOC) has reviewed patient and no TOC needs have been identified at this time. We will continue to monitor patient advancement through interdisciplinary progression rounds. If new patient transition needs arise, please place a TOC consult.          Patient Goals and CMS Choice        Expected Discharge Plan and Services                                                Prior Living Arrangements/Services                       Activities of Daily Living Home Assistive Devices/Equipment: Cane (specify quad or straight) ADL Screening (condition at time of admission) Patient's cognitive ability adequate to safely complete daily activities?: Yes Is the patient deaf or have difficulty hearing?: No Does the patient have difficulty seeing, even when wearing glasses/contacts?: No Does the patient have difficulty concentrating, remembering, or making decisions?: No Patient able to express need for assistance with ADLs?: Yes Does the patient have difficulty dressing or bathing?: No Independently performs ADLs?: Yes (appropriate for developmental age) Does the patient have difficulty walking or climbing stairs?: No Weakness of Legs: None Weakness of Arms/Hands: None  Permission Sought/Granted                  Emotional Assessment              Admission diagnosis:  PAF (paroxysmal atrial fibrillation) ([place]) [I48.0] Positive D dimer  [R79.89] Atrial fibrillation with rapid ventricular response ([place]) [I48.91] Troponin I above reference range [R77.8] Atrial fibrillation with RVR ([place]) [I48.91] Patient Active Problem List   Diagnosis Date Noted   Positive D dimer 11/16/2021   Atrial fibrillation with rapid ventricular response ([place]) 11/15/2021   Unintentional weight loss 09/19/2021   Secondary hyperparathyroidism of renal origin ([place]) 06/19/2020   Primary open angle glaucoma (POAG) of both eyes 06/19/2020   Malignant neoplasm of upper-outer quadrant of left breast in female, estrogen receptor positive ([place]) 12/03/2017   Osteoporosis 09/29/2017   History of arterial ischemic stroke 08/27/2017   Thrombocytopenia ([place]) 08/27/2017   Anemia 08/27/2017   PAF (paroxysmal atrial fibrillation) ([employer])    Elevated troponin 05/04/2016   Assistance needed with transportation 07/31/2015   Advanced care planning/counseling discussion 07/27/2015   Lower back pain 03/14/2014   Macular degeneration    Diabetic retinopathy with macular edema ([place]) 03/03/2014   Medicare annual wellness visit, subsequent 01/07/2013   CKD stage 5 due to type 2 diabetes mellitus ([place]) 01/06/2013   Carotid stenosis, asymptomatic 12/05/2012   Post-surgical hypothyroidism    OA (osteoarthritis)    Type 2 diabetes mellitus with other specified complication ([place])  HTN (hypertension)    History of breast cancer    History of colon cancer    GERD (gastroesophageal reflux disease)    PCP:  Ria Bush, MD Pharmacy:   [id number] [place] [address]Phone: [phone]: [phone]    Social Determinants of Health (SDOH) Interventions    Readmission Risk Interventions     View : No data to display.

## 2021-11-17 NOTE — Progress Notes (Signed)
Nsg Discharge Note  Admit Date:  11/15/2021 Discharge date: 11/17/2021   Alexis Tucker to be D/C'd Home per MD order.  AVS completed.  Copy for chart, and copy for patient signed, and dated. Patient/caregiver able to verbalize understanding.  Discharge Medication: Allergies as of 11/17/2021       Reactions   Tramadol Other (See Comments)   nightmares   Codeine Anxiety   Sulfa Antibiotics Itching, Rash        Medication List     STOP taking these medications    amLODipine 5 MG tablet Commonly known as: NORVASC   aspirin EC 81 MG tablet   Folic Acid-Vit T5-TDD U20 2.5-25-1 MG Tabs tablet Commonly known as: FOLBEE Replaced by: B-complex with vitamin C tablet       TAKE these medications    acetaminophen 325 MG tablet Commonly known as: TYLENOL Take 1 tablet (325 mg total) by mouth every 6 (six) hours as needed for mild pain (or Fever >/= 101).   Acetylcysteine 600 MG Caps Take by mouth daily.   [employer] Blood Glucose Meter w/Device Kit Use as directed to check sugars daily 250.42   allopurinol 100 MG tablet Commonly known as: ZYLOPRIM Take 1 tablet (100 mg total) by mouth daily.   anastrozole 1 MG tablet Commonly known as: ARIMIDEX Take 1 tablet (1 mg total) by mouth daily.   apixaban 5 MG Tabs tablet Commonly known as: ELIQUIS Take 1 tablet (5 mg total) by mouth 2 (two) times daily.   B-complex with vitamin C tablet Take 1 tablet by mouth daily. Start taking on: Nov 18, 2540 Replaces: Folic Acid-Vit H0-WCB J62 2.5-25-1 MG Tabs tablet   CALCIUM-MAGNESIUM PO Take 1 tablet by mouth daily.   carvedilol 25 MG tablet Commonly known as: COREG Take 1 tablet (25 mg total) by mouth 2 (two) times daily with a meal. What changed: when to take this   diclofenac sodium 1 % Gel Commonly known as: VOLTAREN APPLY 1 APPLICATION TOPICALLY 3 (THREE) TIMES DAILY.   ferrous sulfate 160 (50 Fe) MG Tbcr SR tablet Commonly known as: Slow Iron Take 1 tablet (160 mg total)  by mouth daily.   isosorbide dinitrate 20 MG tablet Commonly known as: ISORDIL Take 1 tablet (20 mg total) by mouth daily.   levothyroxine 88 MCG tablet Commonly known as: SYNTHROID Take 1 tablet (88 mcg total) by mouth daily.   Onglyza 2.5 MG Tabs tablet Generic drug: saxagliptin HCl Take 1 tablet (2.5 mg total) by mouth daily.   pantoprazole 40 MG tablet Commonly known as: PROTONIX Take 1 tablet (40 mg total) by mouth daily as needed.   pentoxifylline 400 MG CR tablet Commonly known as: TRENTAL Take 1 tablet (400 mg total) by mouth daily.   Rhopressa 0.02 % Soln Generic drug: Netarsudil Dimesylate Apply to eye once a week.   sodium bicarbonate 650 MG tablet Take 650 mg by mouth 2 (two) times daily as needed for heartburn.   Vitamin D-3 125 MCG (5000 UT) Tabs Take 1 tablet by mouth daily.        Discharge Assessment: Vitals:   11/17/21 0316 11/17/21 1137  BP: (!) 157/58 (!) 140/59  Pulse: 68 69  Resp: 16 (!) 22  Temp: 98.6 F (37 C) 98.2 F (36.8 C)  SpO2: 100% 100%   Skin clean, dry and intact without evidence of skin break down, no evidence of skin tears noted. IV catheter discontinued intact. Site without signs and symptoms of complications - no  redness or edema noted at insertion site, patient denies c/o pain - only slight tenderness at site.  Dressing with slight pressure applied.  D/c Instructions-Education: Discharge instructions given to patient/family with verbalized understanding. D/c education completed with patient/family including follow up instructions, medication list, d/c activities limitations if indicated, with other d/c instructions as indicated by MD - patient able to verbalize understanding, all questions fully answered. Patient instructed to return to ED, call 911, or call MD for any changes in condition.  Patient escorted via [place], and D/C home via private auto.  Prithvi Kooi, Jolene Schimke, RN 11/17/2021 1:44 PM

## 2021-11-17 NOTE — Discharge Summary (Signed)
Physician Discharge Summary  Alexis Tucker MGQ:[id number] DOB: 05/21/1941 DOA: 11/15/2021  PCP: Ria Bush, MD  Admit date: 11/15/2021 Discharge date: 11/17/2021 Recommendations for Outpatient Follow-up:  Follow up with PCP in 1 weeks-call for appointment Please obtain BMP/CBC in one week  Discharge Dispo: HOME Discharge Condition: Stable Code Status:   Code Status: Full Code Diet recommendation:  Diet Order             Diet Carb Modified Fluid consistency: Thin; Room service appropriate? Yes  Diet effective now                   Brief/Interim Summary: 81 year old female with diabetes,OSA,hypertension, chronic back pain, history of colon cancer/hysterectomy, CVA, hypothyroidism, anemia of chronic disease, hypertension, chronic kidney disease V-baseline creatinine ~ 4.5,followed by nephrology sent to the ED from nephrology office due to elevated heart rate of 130, without chest pain shortness of breath fatigue weakness. She was seen in the ED, previously had episode of A-fib once after her procedure that was managed with diltiazem but did not have follow-up.Work-up with chest x-ray some central vascular congestion EKG with A-fib with RVR, EKG some nonspecific ST changes in V2,troponin mildly elevated flat 25 >24 , elevated D-dimer 5.7 Labs with anemia CKD TSH normal Free T4 normal placed on Cardizem heparin infusion and admitted for further work-up-VQ scan was ordered-and negative.Seen by cardiology- pt converted to normal sinus rhythm, given her high CHA2DS2-VASc score placed on anticoagulation, medication converted to carvedilol twice daily, at this time patient is stable for discharge home echocardiogram unremarkable.  She will follow-up with outpatient PCP for CBC and follow-up with cardiology as outpatient.   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 PO Coreg, eliquis.Echo-EF 55 to 60%, G2 DD, mitral valve with degenerative left atrium is mild to moderately dilated aortic valve is calcified.  Okay for discharge home today after singlets.   Positive D dimer: Unclear etiology could be in the setting of A-fib RVR multiple medical issues.  For completeness -obtained VQ scan-low probability for PE, duplex negative for DVT.  Continue   Anemia, likely from chronic kidney disease: Stable.  CONT home iron, B12 folate.  Follow-up with nephrology monitor.  Overall stable need outpatient follow-up since initiation of anticoagulation Recent Labs  Lab 11/15/21 1522 11/16/21 0504 11/17/21 0203  HGB 10.2* 9.6* 9.5*  HCT 34.1* 31.9* 30.4*     Hypothyroidism: With normal TSH and free T4 cont home Synthroid   Type 2 diabetes mellitus with other specified complication: Hemoglobin A1c 5.7 back in March.  Stable CBG Recent Labs  Lab 11/16/21 0732 11/16/21 1152 11/16/21 1743 11/16/21 2121 11/17/21 0804  GLUCAP 103* 100* 159* 82 111*      HTN: BP well  not well controlled will adjust meds adding hydralazine, remains on Cardizem and Coreg as #1   History of colon cancer-follow-up outpatient with oncology   GERD-continue PPI   CKD stage 5 due to type 2 diabetes mellitus,creatinine at baseline followed by nephrology Metabolic acidosis with CKD.  Bicarb 19-20.Stable renal function. Recent Labs  Lab 11/15/21 1522 11/16/21 0504  BUN 39* 39*  CREATININE 4.72* 4.78*    Class I Obesity:Patient's Body mass index is 32.61 kg/m. : Will benefit with PCP follow-up, weight loss healthy lifestyle and outpatient sleep  evaluation.  Consults: Cardiology Subjective: Seen and examined resting comfortably no new complaints.  Feels ready for home today.  Daughter at the bedside.  Discharge Exam: Vitals:   11/16/21 2324 11/17/21 0316  BP: (!) 143/46 (!) 157/58  Pulse: 75 68  Resp: 18 16  Temp: 98.5 F (36.9 C) 98.6 F (37 C)  SpO2: 100% 100%   General: Pt is alert, awake, not in acute distress Cardiovascular: RRR, S1/S2 +, no rubs, no gallops Respiratory: CTA bilaterally, no wheezing, no rhonchi Abdominal: Soft, NT, ND, bowel sounds + Extremities: no edema, no cyanosis  Discharge Instructions  Discharge Instructions     Amb referral to [facility]   Complete by: As directed    Discharge instructions   Complete by: As directed    Check CBC from PCP in 1 week.  Follow-up with cardiology in 1 to 2 weeks. Please call call MD or return to ER for similar or worsening recurring problem that brought you to hospital or if any fever,nausea/vomiting,abdominal pain, uncontrolled pain, chest pain,  shortness of breath or any other alarming symptoms.  Please follow-up your doctor as instructed in a week time and call the office for appointment.  Please avoid alcohol, smoking, or any other illicit substance and maintain healthy habits including taking your regular medications as prescribed.  You were cared for by a hospitalist during your hospital stay. If you have any questions about your discharge medications or the care you received while you were in the hospital after you are discharged, you can call the unit and ask to speak with the hospitalist on call if the hospitalist that took care of you is not available.  Once you are discharged, your primary care physician will handle any further medical issues. Please note that NO REFILLS for any discharge medications will be authorized once you are discharged, as it is imperative that you return to your primary care physician (or establish a relationship with a  primary care physician if you do not have one) for your aftercare needs so that they can reassess your need for medications and monitor your lab values   Increase activity slowly   Complete by: As directed       Allergies as of 11/17/2021       Reactions   Tramadol Other (See Comments)   nightmares   Codeine Anxiety   Sulfa Antibiotics Itching, Rash        Medication List     STOP taking these medications    amLODipine 5 MG tablet Commonly known as: NORVASC   aspirin EC 81 MG tablet   Folic Acid-Vit B6-Vit B12 2.5-25-1 MG Tabs tablet Commonly known as: FOLBEE Replaced by: B-complex with vitamin C tablet       TAKE these medications    acetaminophen 325 MG tablet Commonly known as: TYLENOL Take 1 tablet (325 mg total) by mouth every 6 (six) hours as needed for mild pain (or Fever >/= 101).   Acetylcysteine 600 MG Caps Take by mouth daily.   [employer] Blood Glucose Meter w/Device Kit Use as directed to check sugars daily 250.42   allopurinol 100 MG tablet Commonly known as: ZYLOPRIM Take 1 tablet (100 mg total) by mouth daily.   anastrozole 1 MG tablet Commonly known   as: ARIMIDEX Take 1 tablet (1 mg total) by mouth daily.   apixaban 5 MG Tabs tablet Commonly known as: ELIQUIS Take 1 tablet (5 mg total) by mouth 2 (two) times daily.   B-complex with vitamin C tablet Take 1 tablet by mouth daily. Start taking on: Nov 18, 3297 Replaces: Folic Acid-Vit M4-QAS T41 2.5-25-1 MG Tabs tablet   CALCIUM-MAGNESIUM PO Take 1 tablet by mouth daily.   carvedilol 25 MG tablet Commonly known as: COREG Take 1 tablet (25 mg total) by mouth 2 (two) times daily with a meal. What changed: when to take this   diclofenac sodium 1 % Gel Commonly known as: VOLTAREN APPLY 1 APPLICATION TOPICALLY 3 (THREE) TIMES DAILY.   ferrous sulfate 160 (50 Fe) MG Tbcr SR tablet Commonly known as: Slow Iron Take 1 tablet (160 mg total) by mouth daily.   isosorbide dinitrate 20 MG  tablet Commonly known as: ISORDIL Take 1 tablet (20 mg total) by mouth daily.   levothyroxine 88 MCG tablet Commonly known as: SYNTHROID Take 1 tablet (88 mcg total) by mouth daily.   Onglyza 2.5 MG Tabs tablet Generic drug: saxagliptin HCl Take 1 tablet (2.5 mg total) by mouth daily.   pantoprazole 40 MG tablet Commonly known as: PROTONIX Take 1 tablet (40 mg total) by mouth daily as needed.   pentoxifylline 400 MG CR tablet Commonly known as: TRENTAL Take 1 tablet (400 mg total) by mouth daily.   Rhopressa 0.02 % Soln Generic drug: Netarsudil Dimesylate Apply to eye once a week.   sodium bicarbonate 650 MG tablet Take 650 mg by mouth 2 (two) times daily as needed for heartburn.   Vitamin D-3 125 MCG (5000 UT) Tabs Take 1 tablet by mouth daily.        Allergies  Allergen Reactions   Tramadol Other (See Comments)    nightmares   Codeine Anxiety   Sulfa Antibiotics Itching and Rash    The results of significant diagnostics from this hospitalization (including imaging, microbiology, ancillary and laboratory) are listed below for reference.    Microbiology: No results found for this or any previous visit (from the past 240 hour(s)).  Procedures/Studies: DG Chest 2 View  Result Date: 11/15/2021 CLINICAL DATA:  Tachycardia that began this morning in a 81 year old female. EXAM: CHEST - 2 VIEW COMPARISON:  May 04, 2016. FINDINGS: Cardiomediastinal contours and hilar structures with central pulmonary vascular engorgement suggested. Mild rotation to the RIGHT, accounting for this heart size is stable. Lungs are clear. Subtle nodular opacity over the RIGHT upper chest is likely an EKG leads sticker which is mainly radial lucent. No sign of pneumothorax. On limited assessment there is no acute skeletal finding. IMPRESSION: 1. Central pulmonary vascular engorgement without edema. 2. No acute cardiopulmonary process. Electronically Signed   By: Zetta Bills M.D.   On:  11/15/2021 16:28   NM Pulmonary Perfusion  Result Date: 11/16/2021 CLINICAL DATA:  Pulmonary embolism (PE) suspected, unknown D-dimer EXAM: NUCLEAR MEDICINE PERFUSION LUNG SCAN TECHNIQUE: Perfusion images were obtained in multiple projections after intravenous injection of radiopharmaceutical. Ventilation scans intentionally deferred if perfusion scan and chest x-ray adequate for interpretation during COVID 19 epidemic. RADIOPHARMACEUTICALS:  4.37 mCi Tc-26mMAA IV COMPARISON:  Radiograph from previous day FINDINGS: Mildly heterogenous distribution of radiopharmaceutical without discrete segmental or subsegmental perfusion defect. IMPRESSION: Low likelihood ratio for pulmonary embolism. Electronically Signed   By: DLucrezia EuropeM.D.   On: 11/16/2021 08:41   [place]Venous Img Lower Bilateral (DVT)  Result Date: 11/16/2021 CLINICAL DATA:  Bilateral lower extremity edema. EXAM: BILATERAL LOWER EXTREMITY VENOUS DOPPLER ULTRASOUND TECHNIQUE: Gray-scale sonography with graded compression, as well as color Doppler and duplex ultrasound were performed to evaluate the lower extremity deep venous systems from the level of the common femoral vein and including the common femoral, femoral, profunda femoral, popliteal and calf veins including the posterior tibial, peroneal and gastrocnemius veins when visible. The superficial great saphenous vein was also interrogated. Spectral Doppler was utilized to evaluate flow at rest and with distal augmentation maneuvers in the common femoral, femoral and popliteal veins. COMPARISON:  None Available. FINDINGS: RIGHT LOWER EXTREMITY Common Femoral Vein: No evidence of thrombus. Normal compressibility, respiratory phasicity and response to augmentation. Saphenofemoral Junction: No evidence of thrombus. Normal compressibility and flow on color Doppler imaging. Profunda Femoral Vein: No evidence of thrombus. Normal compressibility and flow on color Doppler imaging. Femoral Vein: No evidence  of thrombus. Normal compressibility, respiratory phasicity and response to augmentation. Popliteal Vein: No evidence of thrombus. Normal compressibility, respiratory phasicity and response to augmentation. Calf Veins: No evidence of thrombus. Normal compressibility and flow on color Doppler imaging. LEFT LOWER EXTREMITY Common Femoral Vein: No evidence of thrombus. Normal compressibility, respiratory phasicity and response to augmentation. Saphenofemoral Junction: No evidence of thrombus. Normal compressibility and flow on color Doppler imaging. Profunda Femoral Vein: No evidence of thrombus. Normal compressibility and flow on color Doppler imaging. Femoral Vein: No evidence of thrombus. Normal compressibility, respiratory phasicity and response to augmentation. Popliteal Vein: No evidence of thrombus. Normal compressibility, respiratory phasicity and response to augmentation. Calf Veins: No evidence of thrombus. Normal compressibility and flow on color Doppler imaging. IMPRESSION: No evidence of deep venous thrombosis in either lower extremity. Electronically Signed   By: Frederick S Jones M.D.   On: 11/16/2021 09:45   ECHOCARDIOGRAM COMPLETE  Result Date: 11/16/2021    ECHOCARDIOGRAM REPORT   Patient Name:   Alexis Tucker Date of Exam: 11/16/2021 Medical Rec #:  [id number]    Height:       64.0 in Accession #:    [id number]   Weight:       190.0 lb Date of Birth:  11/23/1940    BSA:          1.914 m Patient Age:    80 years     BP:           116/87 mmHg Patient Gender: F            HR:           52 bpm. Exam Location:  [facility] Procedure: 2D Echo, Cardiac Doppler and Color Doppler Indications:     Atrial Fibrillation I48.91  History:         Patient has prior history of Echocardiogram examinations, most                  recent 05/04/2016. Risk Factors:Hypertension and Diabetes.                  Anemia, CKD.  Sonographer:     Jerry Hege Referring Phys:  [id number] BRIAN AGBOR-ETANG Diagnosing Phys: Brian Agbor-Etang MD  IMPRESSIONS  1. Left ventricular ejection fraction, by estimation, is 55 to 60%. The left ventricle has normal function. The left ventricle has no regional wall motion abnormalities. There is mild left ventricular hypertrophy. Left ventricular diastolic parameters are consistent with Grade II diastolic dysfunction (pseudonormalization).  2. Right ventricular systolic function is normal. The right ventricular size is mildly   enlarged.  3. Left atrial size was mild to moderately dilated.  4. The mitral valve is degenerative. Mild mitral valve regurgitation. Moderate mitral annular calcification.  5. The aortic valve is calcified. Aortic valve regurgitation is mild. Mild aortic valve stenosis. FINDINGS  Left Ventricle: Left ventricular ejection fraction, by estimation, is 55 to 60%. The left ventricle has normal function. The left ventricle has no regional wall motion abnormalities. The left ventricular internal cavity size was normal in size. There is  mild left ventricular hypertrophy. Left ventricular diastolic parameters are consistent with Grade II diastolic dysfunction (pseudonormalization). Right Ventricle: The right ventricular size is mildly enlarged. No increase in right ventricular wall thickness. Right ventricular systolic function is normal. Left Atrium: Left atrial size was mild to moderately dilated. Right Atrium: Right atrial size was normal in size. Pericardium: There is no evidence of pericardial effusion. Mitral Valve: The mitral valve is degenerative in appearance. There is mild calcification of the mitral valve leaflet(s). Moderate mitral annular calcification. Mild mitral valve regurgitation. MV peak gradient, 7.7 mmHg. The mean mitral valve gradient is 2.0 mmHg. Tricuspid Valve: The tricuspid valve is normal in structure. Tricuspid valve regurgitation is mild. Aortic Valve: The aortic valve is calcified. Aortic valve regurgitation is mild. Mild aortic stenosis is present. Aortic valve mean  gradient measures 3.3 mmHg. Aortic valve peak gradient measures 5.2 mmHg. Aortic valve area, by VTI measures 2.52 cm. Pulmonic Valve: The pulmonic valve was not well visualized. Pulmonic valve regurgitation is not visualized. Aorta: The aortic root is normal in size and structure. Venous: The inferior vena cava was not well visualized. IAS/Shunts: No atrial level shunt detected by color flow Doppler.  LEFT VENTRICLE PLAX 2D LVIDd:         3.80 cm   Diastology LVIDs:         2.30 cm   LV e' medial:    6.74 cm/s LV PW:         1.20 cm   LV E/e' medial:  18.2 LV IVS:        1.00 cm   LV e' lateral:   6.85 cm/s LVOT diam:     2.00 cm   LV E/e' lateral: 18.0 LV SV:         65 LV SV Index:   34 LVOT Area:     3.14 cm  RIGHT VENTRICLE RV Basal diam:  4.00 cm LEFT ATRIUM             Index        RIGHT ATRIUM           Index LA diam:        4.50 cm 2.35 cm/m   RA Area:     17.50 cm LA Vol (A2C):   61.4 ml 32.08 ml/m  RA Volume:   46.70 ml  24.40 ml/m LA Vol (A4C):   77.2 ml 40.33 ml/m LA Biplane Vol: 72.0 ml 37.61 ml/m  AORTIC VALVE                    PULMONIC VALVE AV Area (Vmax):    2.34 cm     PV Vmax:        0.68 m/s AV Area (Vmean):   2.14 cm     PV Vmean:       47.200 cm/s AV Area (VTI):     2.52 cm     PV VTI:         0.143 m AV   Vmax:           114.00 cm/s  PV Peak grad:   1.8 mmHg AV Vmean:          83.600 cm/s  PV Mean grad:   1.0 mmHg AV VTI:            0.257 m      RVOT Peak grad: 3 mmHg AV Peak Grad:      5.2 mmHg AV Mean Grad:      3.3 mmHg LVOT Vmax:         84.90 cm/s LVOT Vmean:        57.000 cm/s LVOT VTI:          0.206 m LVOT/AV VTI ratio: 0.80  AORTA Ao Root diam: 3.30 cm MITRAL VALVE                TRICUSPID VALVE MV Area (PHT): 3.23 cm     TR Peak grad:   15.7 mmHg MV Area VTI:   2.20 cm     TR Vmax:        198.00 cm/s MV Peak grad:  7.7 mmHg MV Mean grad:  2.0 mmHg     SHUNTS MV Vmax:       1.39 m/s     Systemic VTI:  0.21 m MV Vmean:      67.4 cm/s    Systemic Diam: 2.00 cm MV Decel Time:  235 msec     Pulmonic VTI:  0.147 m MV E velocity: 123.00 cm/s MV A velocity: 65.60 cm/s MV E/A ratio:  1.88 Kate Sable MD Electronically signed by Kate Sable MD Signature Date/Time: 11/16/2021/5:02:02 PM    Final     Labs: BNP (last 3 results) No results for input(s): BNP in the last 8760 hours. Basic Metabolic Panel: Recent Labs  Lab 11/15/21 1522 11/15/21 1722 11/16/21 0504  NA 142  --  142  K 5.0  --  4.6  CL 111  --  115*  CO2 20*  --  19*  GLUCOSE 123*  --  112*  BUN 39*  --  39*  CREATININE 4.72*  --  4.78*  CALCIUM 9.4  --  9.2  MG  --  1.7  --    Liver Function Tests: Recent Labs  Lab 11/15/21 1722  AST 15  ALT 10  ALKPHOS 70  BILITOT 0.5  PROT 7.5  ALBUMIN 3.8   No results for input(s): LIPASE, AMYLASE in the last 168 hours. No results for input(s): AMMONIA in the last 168 hours. CBC: Recent Labs  Lab 11/15/21 1522 11/16/21 0504 11/17/21 0203  WBC 5.5 5.8 6.7  HGB 10.2* 9.6* 9.5*  HCT 34.1* 31.9* 30.4*  MCV 96.1 97.6 95.6  PLT 131* 116* 106*   Cardiac Enzymes: No results for input(s): CKTOTAL, CKMB, CKMBINDEX, TROPONINI in the last 168 hours. BNP: Invalid input(s): POCBNP CBG: Recent Labs  Lab 11/16/21 0732 11/16/21 1152 11/16/21 1743 11/16/21 2121 11/17/21 0804  GLUCAP 103* 100* 159* 82 111*   D-Dimer Recent Labs    11/15/21 1522  DDIMER 5.72*   Hgb A1c No results for input(s): HGBA1C in the last 72 hours. Lipid Profile Recent Labs    11/16/21 0504  CHOL 177  HDL 60  LDLCALC 104*  TRIG 64  CHOLHDL 3.0   Thyroid function studies Recent Labs    11/15/21 1522  TSH 2.284   Anemia work up No results for input(s): VITAMINB12, FOLATE, FERRITIN, TIBC, IRON, RETICCTPCT in  the last 72 hours. Urinalysis    Component Value Date/Time   COLORURINE YELLOW (A) 08/17/2017 2246   APPEARANCEUR CLEAR (A) 08/17/2017 2246   LABSPEC 1.012 08/17/2017 2246   PHURINE 6.0 08/17/2017 2246   GLUCOSEU 50 (A) 08/17/2017 2246    HGBUR SMALL (A) 08/17/2017 2246   BILIRUBINUR NEGATIVE 08/17/2017 2246   KETONESUR NEGATIVE 08/17/2017 2246   PROTEINUR 100 (A) 08/17/2017 2246   NITRITE NEGATIVE 08/17/2017 2246   LEUKOCYTESUR SMALL (A) 08/17/2017 2246   Sepsis Labs Invalid input(s): PROCALCITONIN,  WBC,  LACTICIDVEN Microbiology No results found for this or any previous visit (from the past 240 hour(s)).  Time coordinating discharge: 25 minutes.  SIGNED:  , MD  [facility] 11/17/2021, 10:51 AM  If 7PM-7AM, please contact night-coverage [email or web address]    

## 2021-11-24 ENCOUNTER — Encounter: Payer: Self-pay | Admitting: Cardiology

## 2021-11-24 ENCOUNTER — Ambulatory Visit (INDEPENDENT_AMBULATORY_CARE_PROVIDER_SITE_OTHER): Payer: Medicare Other | Admitting: Cardiology

## 2021-11-24 VITALS — BP 160/60 | HR 65 | Ht 64.0 in | Wt 186.0 lb

## 2021-11-24 DIAGNOSIS — I480 Paroxysmal atrial fibrillation: Secondary | ICD-10-CM

## 2021-11-24 DIAGNOSIS — I350 Nonrheumatic aortic (valve) stenosis: Secondary | ICD-10-CM | POA: Diagnosis not present

## 2021-11-24 DIAGNOSIS — I10 Essential (primary) hypertension: Secondary | ICD-10-CM | POA: Diagnosis not present

## 2021-11-24 MED ORDER — AMLODIPINE BESYLATE 5 MG PO TABS: 5.0000 mg | ORAL_TABLET | Freq: Every day | ORAL | 3 refills | Status: DC

## 2021-11-24 NOTE — Progress Notes (Signed)
Cardiology Office Note:    Date:  11/24/2021   ID:  Alexis Tucker, DOB 04/06/1941, MRN [id number]  PCP:  Ria Bush, MD   [facility] Providers Cardiologist:  None     Referring MD: Ria Bush, MD   Chief Complaint  Patient presents with   Hospitalization Follow-up    History of Present Illness:    Alexis Tucker is a 81 y.o. 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  Patient was seen in the hospital 2 weeks ago due to elevated heart rates.  She presented at her neurologist office, heart rates are noted to be elevated, advised to go to the ED.  In the ED EKG showed A-fib with RVR heart rate 120.  Started on diltiazem drip with spontaneous conversion to sinus rhythm.  Started on Coreg 25 mg twice daily, Eliquis 5 mg twice daily.  Echocardiogram 11/17/2021 EF 55 to 60%, mild aortic valve stenosis.  Denies palpitations, denies bleeding issues with Eliquis.  Blood pressures at home 102 systolic.  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  2.5 cm maximum diameter, multifocal.  MammoSite   Carotid stenosis, asymptomatic 01/2014   mild ICA, severe in ECA, rec rpt [place] 2 years   Cataracts, bilateral    CKD (chronic kidney disease) stage 4, GFR 15-29 ml/min ([place]) 01/2012   Cr 3.85, in 3s since 2010, discussing HD and L forearm graft ([place])   Diabetes type 2, controlled ([place]) 2000s   Diabetic retinopathy with macular edema ([place]) 03/2014   [place] at [facility] eye on avastin   DJD (degenerative joint disease)    per prior pcp   GERD (gastroesophageal reflux disease)    Glaucoma 03/2014   History of breast cancer 2008   infiltrating ductal carcinoma s/p mammosite   History of colon cancer 2006   s/p colectomy   History of hepatitis A  1990s   from food, resolved   HTN (hypertension)    Macular degeneration    wet R with vision loss, dry L; to see retina specialist   Neuropathy    per prior pcp   OA (osteoarthritis)    knees and hips   Osteonecrosis (HCC)    hips?   Personal history of radiation therapy 2008   F/U Left Breast Cancer   Personal history of radiation therapy 2019   mammosite left breast ca   PONV (postoperative nausea and vomiting)    Post-surgical hypothyroidism    Wears dentures    partial upper    Past Surgical History:  Procedure Laterality Date   ABDOMINAL HYSTERECTOMY  1976   partial, after tubal pregnancy, h/o fibroids with heavy bleeding   BREAST BIOPSY Left 2008   positive   BREAST BIOPSY Left 11/22/2017   2oclock 2cmfn wing shaped clip, INVASIVE MAMMARY CARCINOMA   BREAST BIOPSY Left 11/22/2017   2oclock 4cmfn coil shaped clip, INVASIVE MAMMARY CARCINOMA   BREAST BIOPSY Left 11/22/2017   lymph node - butterfly hydroMARK   BREAST EXCISIONAL BIOPSY Left 01/07/2018   lumpectomy by Dr. Bary Castilla   BREAST LUMPECTOMY Left 2008   F/U radiation   BREAST LUMPECTOMY Left 2019   2 areas of [place] at 2:00 position f/u with mammosite   BREAST LUMPECTOMY WITH  SENTINEL LYMPH NODE BIOPSY Left 01/07/2018   Procedure: BREAST LUMPECTOMY WITH SENTINEL LYMPH NODE BX;  Surgeon: Robert Bellow, MD;  Location: [facility] ORS;  Service: General;  Laterality: Left;   BREAST MAMMOSITE  2008   carotid ultrasound  12/2012   1-39% bilateral stenosis, severe in ECA   CATARACT EXTRACTION W/PHACO Left 08/14/2016   Procedure: CATARACT EXTRACTION PHACO AND INTRAOCULAR LENS PLACEMENT ([place])  Left Diabetic;  Surgeon: Ronnell Freshwater, MD;  Location: [place];  Service: Ophthalmology;  Laterality: Left;  Diabetic - oral meds   CHOLECYSTECTOMY  2005   COLECTOMY  2005   colon cancer   COLON SURGERY  2006   bowel obstruction   COLONOSCOPY  2008   ext hem, ileo-colonic anastomosis in cecum, tubular  adenoma x1, rec rpt 1 yr for multiple polyps (in [place])   COLONOSCOPY WITH PROPOFOL N/A 10/19/2021   2.5cm TVA with low grade dysplasia, diverticulosis, no rpt recommended Vicente Males, Bailey Mech, MD)   ESOPHAGOGASTRODUODENOSCOPY N/A 05/06/2016   Procedure: ESOPHAGOGASTRODUODENOSCOPY (EGD);  Surgeon: Manya Silvas, MD;  Location[facility] ENDOSCOPY;  Service: Endoscopy;  Laterality: N/A;   EYE SURGERY     JOINT REPLACEMENT     THYROIDECTOMY, PARTIAL     unclear why   TOTAL KNEE ARTHROPLASTY Right 1999   US ECHOCARDIOGRAPHY  11/2007   nl LV fxn EF 00%, diastolic dysfunction, LVH, tr TR and MR, slcerotic aortic valve    Current Medications: Current Meds  Medication Sig   acetaminophen (TYLENOL) 325 MG tablet Take 1 tablet (325 mg total) by mouth every 6 (six) hours as needed for mild pain (or Fever >/= 101).   allopurinol (ZYLOPRIM) 100 MG tablet Take 1 tablet (100 mg total) by mouth daily.   amLODipine (NORVASC) 5 MG tablet Take 1 tablet (5 mg total) by mouth daily.   anastrozole (ARIMIDEX) 1 MG tablet Take 1 tablet (1 mg total) by mouth daily.   apixaban (ELIQUIS) 5 MG TABS tablet Take 1 tablet (5 mg total) by mouth 2 (two) times daily.   B Complex-C (B-COMPLEX WITH VITAMIN C) tablet Take 1 tablet by mouth daily.   Blood Glucose Monitoring Suppl (ACURA BLOOD GLUCOSE METER) W/DEVICE KIT Use as directed to check sugars daily 250.42   CALCIUM-MAGNESIUM PO Take 1 tablet by mouth daily.   carvedilol (COREG) 25 MG tablet Take 1 tablet (25 mg total) by mouth 2 (two) times daily with a meal.   Cholecalciferol (VITAMIN D-3) 5000 UNITS TABS Take 1 tablet by mouth daily.   isosorbide dinitrate (ISORDIL) 20 MG tablet Take 1 tablet (20 mg total) by mouth daily.   levothyroxine (SYNTHROID) 88 MCG tablet Take 1 tablet (88 mcg total) by mouth daily.   pantoprazole (PROTONIX) 40 MG tablet Take 1 tablet (40 mg total) by mouth daily as needed.   pentoxifylline (TRENTAL) 400 MG CR tablet Take 1 tablet (400 mg total) by  mouth daily.   saxagliptin HCl (ONGLYZA) 2.5 MG TABS tablet Take 1 tablet (2.5 mg total) by mouth daily.   sodium bicarbonate 650 MG tablet Take 650 mg by mouth 2 (two) times daily as needed for heartburn.   [DISCONTINUED] aspirin EC 81 MG tablet Take 81 mg by mouth daily. Swallow whole.     Allergies:   Tramadol, Codeine, and Sulfa antibiotics   Social History   Socioeconomic History   Marital status: Divorced    Spouse name: Not on file   Number of children: Not on file   Years of  education: Not on file   Highest education level: Not on file  Occupational History   Not on file  Tobacco Use   Smoking status: Former   Smokeless tobacco: Never   Tobacco comments:    quit over 40 yrs ago  Vaping Use   Vaping Use: Never used  Substance and Sexual Activity   Alcohol use: Never    Comment: rare   Drug use: Never   Sexual activity: Not Currently  Other Topics Concern   Not on file  Social History Narrative   Lives alone, 1 dog.   Granddaughter in [place] (attorney).   Divorced   Occu: retired, worked in Engineer, technical sales   Edu: college   Social Determinants of Radio broadcast assistant Strain: Not on [employer] Insecurity: Not on file  Transportation Needs: Not on file  Physical Activity: Not on file  Stress: Not on file  Social Connections: Not on file     Family History: The patient's family history includes Alcohol abuse in her father; Arthritis in her father and mother; Diabetes in her mother; Hypertension in her mother; Stroke in her daughter. There is no history of CAD, Cancer, or Breast cancer.  ROS:   Please see the history of present illness.     All other systems reviewed and are negative.  EKGs/Labs/Other Studies Reviewed:    The following studies were reviewed today:   EKG:  EKG is  ordered today.  The ekg ordered today demonstrates normal sinus rhythm, heart rate 64  Recent Labs: 11/15/2021: ALT 10; Magnesium 1.7; TSH 2.284 11/16/2021: BUN 39; Creatinine, Ser 4.78;  Potassium 4.6; Sodium 142 11/17/2021: Hemoglobin 9.5; Platelets 106  Recent Lipid Panel    Component Value Date/Time   CHOL 177 11/16/2021 0504   TRIG 64 11/16/2021 0504   TRIG 97 12/04/2009 0000   HDL 60 11/16/2021 0504   CHOLHDL 3.0 11/16/2021 0504   VLDL 13 11/16/2021 0504   LDLCALC 104 (H) 11/16/2021 0504   LDLDIRECT 108 (H) 03/13/2014 1624     Risk Assessment/Calculations:          Physical Exam:    VS:  BP (!) 160/60 (BP Location: Left Arm, Patient Position: Sitting, Cuff Size: Normal)   Pulse 65   Ht $R'5\' 4"'jd$  (1.626 m)   Wt 186 lb (84.4 kg)   SpO2 98%   BMI 31.93 kg/m     Wt Readings from Last 3 Encounters:  11/24/21 186 lb (84.4 kg)  11/15/21 190 lb (86.2 kg)  10/19/21 199 lb (90.3 kg)     GEN:  Well nourished, well developed in no acute distress HEENT: Normal NECK: No JVD; No carotid bruits LYMPHATICS: No lymphadenopathy CARDIAC: RRR, faint systolic murmur RESPIRATORY:  Clear to auscultation without rales, wheezing or rhonchi  ABDOMEN: Soft, non-tender, non-distended MUSCULOSKELETAL:  No edema; No deformity  SKIN: Warm and dry NEUROLOGIC:  Alert and oriented x 3 PSYCHIATRIC:  Normal affect   ASSESSMENT:    1. Paroxysmal atrial fibrillation (HCC)   2. Primary hypertension   3. Nonrheumatic aortic valve stenosis    PLAN:    In order of problems listed above:  Paroxysmal atrial fibrillation, continue Coreg, Eliquis.  Denies palpitations, maintaining sinus rhythm. Hypertension, BP elevated, start Norvasc 5 mg daily, continue Coreg. Mild aortic valve stenosis, monitor serially with echocardiograms.  Follow-up 3 months.       Medication Adjustments/Labs and Tests Ordered: Current medicines are reviewed at length with the patient today.  Concerns regarding  medicines are outlined above.  Orders Placed This Encounter  Procedures   EKG 12-Lead   Meds ordered this encounter  Medications   amLODipine (NORVASC) 5 MG tablet    Sig: Take 1 tablet (5  mg total) by mouth daily.    Dispense:  30 tablet    Refill:  3    Patient Instructions  Medication Instructions:   Your physician has recommended you make the following change in your medication:    STOP taking your Aspirin.  2.    START taking Amlodipine 5 MG once a day.  *If you need a refill on your cardiac medications before your next appointment, please call your pharmacy*   Lab Work: None ordered If you have labs (blood work) drawn today and your tests are completely normal, you will receive your results only by: [place] (if you have MyChart) OR A paper copy in the mail If you have any lab test that is abnormal or we need to change your treatment, we will call you to review the results.   Testing/Procedures: None ordered   Follow-Up: At [facility], you and your health needs are our priority.  As part of our continuing mission to provide you with exceptional heart care, we have created designated Provider Care Teams.  These Care Teams include your primary Cardiologist (physician) and Advanced Practice Providers (APPs -  Physician Assistants and Nurse Practitioners) who all work together to provide you with the care you need, when you need it.  We recommend signing up for the patient portal called "MyChart".  Sign up information is provided on this After Visit Summary.  MyChart is used to connect with patients for Virtual Visits (Telemedicine).  Patients are able to view lab/test results, encounter notes, upcoming appointments, etc.  Non-urgent messages can be sent to your provider as well.   To learn more about what you can do with MyChart, go to [email or web address].    Your next appointment:   3 month(s)  The format for your next appointment:   In Person  Provider:   Kate Sable, MD    Other Instructions   Important Information About Sugar         Signed, Kate Sable, MD  11/24/2021 1:24 PM    [place]

## 2021-11-24 NOTE — Patient Instructions (Signed)
Medication Instructions:   Your physician has recommended you make the following change in your medication:    STOP taking your Aspirin.  2.    START taking Amlodipine 5 MG once a day.  *If you need a refill on your cardiac medications before your next appointment, please call your pharmacy*   Lab Work: None ordered If you have labs (blood work) drawn today and your tests are completely normal, you will receive your results only by: [place] (if you have MyChart) OR A paper copy in the mail If you have any lab test that is abnormal or we need to change your treatment, we will call you to review the results.   Testing/Procedures: None ordered   Follow-Up: At [facility], you and your health needs are our priority.  As part of our continuing mission to provide you with exceptional heart care, we have created designated Provider Care Teams.  These Care Teams include your primary Cardiologist (physician) and Advanced Practice Providers (APPs -  Physician Assistants and Nurse Practitioners) who all work together to provide you with the care you need, when you need it.  We recommend signing up for the patient portal called "MyChart".  Sign up information is provided on this After Visit Summary.  MyChart is used to connect with patients for Virtual Visits (Telemedicine).  Patients are able to view lab/test results, encounter notes, upcoming appointments, etc.  Non-urgent messages can be sent to your provider as well.   To learn more about what you can do with MyChart, go to [email or web address].    Your next appointment:   3 month(s)  The format for your next appointment:   In Person  Provider:   Kate Sable, MD    Other Instructions   Important Information About Sugar

## 2021-11-25 ENCOUNTER — Telehealth: Payer: Self-pay

## 2021-11-25 ENCOUNTER — Encounter: Payer: Self-pay | Admitting: Family Medicine

## 2021-11-25 ENCOUNTER — Ambulatory Visit (INDEPENDENT_AMBULATORY_CARE_PROVIDER_SITE_OTHER): Payer: Medicare Other | Admitting: Family Medicine

## 2021-11-25 VITALS — BP 134/66 | HR 66 | Temp 97.4°F | Ht 64.0 in | Wt 186.1 lb

## 2021-11-25 DIAGNOSIS — D631 Anemia in chronic kidney disease: Secondary | ICD-10-CM | POA: Diagnosis not present

## 2021-11-25 DIAGNOSIS — N2581 Secondary hyperparathyroidism of renal origin: Secondary | ICD-10-CM | POA: Diagnosis not present

## 2021-11-25 DIAGNOSIS — I480 Paroxysmal atrial fibrillation: Secondary | ICD-10-CM | POA: Diagnosis not present

## 2021-11-25 DIAGNOSIS — Z85038 Personal history of other malignant neoplasm of large intestine: Secondary | ICD-10-CM

## 2021-11-25 DIAGNOSIS — I10 Essential (primary) hypertension: Secondary | ICD-10-CM | POA: Diagnosis not present

## 2021-11-25 DIAGNOSIS — R531 Weakness: Secondary | ICD-10-CM | POA: Diagnosis not present

## 2021-11-25 DIAGNOSIS — E1122 Type 2 diabetes mellitus with diabetic chronic kidney disease: Secondary | ICD-10-CM

## 2021-11-25 DIAGNOSIS — N185 Chronic kidney disease, stage 5: Secondary | ICD-10-CM | POA: Diagnosis not present

## 2021-11-25 LAB — RENAL FUNCTION PANEL
Albumin: 3.8 g/dL (ref 3.5–5.2)
BUN: 42 mg/dL — ABNORMAL HIGH (ref 6–23)
CO2: 22 mEq/L (ref 19–32)
Calcium: 9.6 mg/dL (ref 8.4–10.5)
Chloride: 111 mEq/L (ref 96–112)
Creatinine, Ser: 5.16 mg/dL (ref 0.40–1.20)
GFR: 7.52 mL/min — CL (ref >60.00)
Glucose, Bld: 117 mg/dL — ABNORMAL HIGH (ref 70–99)
Phosphorus: 4 mg/dL (ref 2.3–4.6)
Potassium: 4.7 mEq/L (ref 3.5–5.1)
Sodium: 140 mEq/L (ref 135–145)

## 2021-11-25 LAB — IBC PANEL
Iron: 44 ug/dL (ref 42–145)
Saturation Ratios: 17.3 % — ABNORMAL LOW (ref 20.0–50.0)
TIBC: 254.8 ug/dL (ref 250.0–450.0)
Transferrin: 182 mg/dL — ABNORMAL LOW (ref 212.0–360.0)

## 2021-11-25 LAB — CBC WITH DIFFERENTIAL/PLATELET
Basophils Absolute: 0 10*3/uL (ref 0.0–0.1)
Basophils Relative: 0.8 % (ref 0.0–3.0)
Eosinophils Absolute: 0.3 10*3/uL (ref 0.0–0.7)
Eosinophils Relative: 5.1 % — ABNORMAL HIGH (ref 0.0–5.0)
HCT: 28.4 % — ABNORMAL LOW (ref 36.0–46.0)
Hemoglobin: 9.3 g/dL — ABNORMAL LOW (ref 12.0–15.0)
Lymphocytes Relative: 27.2 % (ref 12.0–46.0)
Lymphs Abs: 1.4 10*3/uL (ref 0.7–4.0)
MCHC: 32.7 g/dL (ref 30.0–36.0)
MCV: 93.6 fl (ref 78.0–100.0)
Monocytes Absolute: 0.6 10*3/uL (ref 0.1–1.0)
Monocytes Relative: 11.1 % (ref 3.0–12.0)
Neutro Abs: 2.9 10*3/uL (ref 1.4–7.7)
Neutrophils Relative %: 55.8 % (ref 43.0–77.0)
Platelets: 111 10*3/uL — ABNORMAL LOW (ref 150.0–400.0)
RBC: 3.03 Mil/uL — ABNORMAL LOW (ref 3.87–5.11)
RDW: 15.7 % — ABNORMAL HIGH (ref 11.5–15.5)
WBC: 5.2 10*3/uL (ref 4.0–10.5)

## 2021-11-25 LAB — FERRITIN: Ferritin: 150.4 ng/mL (ref 10.0–291.0)

## 2021-11-25 MED ORDER — PANTOPRAZOLE SODIUM 40 MG PO TBEC: 40.0000 mg | DELAYED_RELEASE_TABLET | Freq: Every day | ORAL | 1 refills | Status: DC | PRN

## 2021-11-25 MED ORDER — APIXABAN 5 MG PO TABS: 5.0000 mg | ORAL_TABLET | Freq: Two times a day (BID) | ORAL | 6 refills | Status: DC

## 2021-11-25 NOTE — Telephone Encounter (Signed)
Noted  

## 2021-11-25 NOTE — Progress Notes (Unsigned)
Patient ID: Alexis Tucker, female    DOB: 1941-01-21, 81 y.o.   MRN: [id number]  This visit was conducted in person.  BP 134/66   Pulse 66 Comment: on manual check  Temp (!) 97.4 F (36.3 C) (Temporal)   Ht _0  (1.626 m)   Wt 186 lb 2 oz (84.4 kg)   SpO2 96% Comment: RA  BMI 31.95 kg/m    CC: hosp f/u visit  Subjective:   HPI: Alexis Tucker is a 81 y.o. female presenting on 11/25/2021 for Hospitalization Follow-up (Admitted on 11/15/21 at [facility], dx Afib w/ RVR; pos D-dimer.  Pt accompanied by daughter, Santiago Glad. )   Recent hospitalization for atrial fibrillation with RVR, sent to the ER from nephrologist office due to tachycardia with heart rate up to 130 otherwise largely asymptomatic.  Initially treated with diltiazem drip with spontaneous conversion to sinus rhythm, discharged on Coreg 25 mg twice daily and Eliquis 5 mg twice daily. D-dimer was elevated, VQ scan was low risk for pulmonary embolism and venous Dopplers were negative for DVT. Hospital records reviewed. Med rec performed.   Home health - not set up. Would be interested in seeing [facility].  Other follow up appointments scheduled: Saw Dr. Garen Lah in follow-up yesterday, started on Norvasc 5 mg daily in addition to her other regimen.  Plan 70-monthfollow-up.  COLONOSCOPY WITH PROPOFOL 10/19/2021 - 2.5cm TVA with low grade dysplasia, diverticulosis, no rpt recommended (Vicente Males KBailey Mech MD). Planned f/u 1 year.  ______________________________________________________________________ Hospital admission: 11/15/2021 Hospital discharge: 11/17/2021 TCM f/u phone call: not performed   Recommendations for Outpatient Follow-up:  Follow up with PCP in 1 weeks-call for appointment Please obtain BMP/CBC in one week   Discharge Dispo: HOME Discharge Condition: Stable Code Status:   Code Status: Full Code  Discharge Diagnoses:  Principal Problem:   Atrial fibrillation with rapid ventricular response ([place] Active Problems:   Positive D  dimer   Anemia   Post-surgical hypothyroidism   Type 2 diabetes mellitus with other specified complication ([place]   HTN (hypertension)   History of colon cancer   GERD (gastroesophageal reflux disease)   CKD stage 5 due to type 2 diabetes mellitus ([place]   PAF (paroxysmal atrial fibrillation) ([place]     Relevant past medical, surgical, family and social history reviewed and updated as indicated. Interim medical history since our last visit reviewed. Allergies and medications reviewed and updated. Outpatient Medications Prior to Visit  Medication Sig Dispense Refill   allopurinol (ZYLOPRIM) 100 MG tablet Take 1 tablet (100 mg total) by mouth daily. 90 tablet 3   amLODipine (NORVASC) 5 MG tablet Take 1 tablet (5 mg total) by mouth daily. 30 tablet 3   anastrozole (ARIMIDEX) 1 MG tablet Take 1 tablet (1 mg total) by mouth daily. 90 each 1   B Complex-C (B-COMPLEX WITH VITAMIN C) tablet Take 1 tablet by mouth daily. 30 tablet 0   Blood Glucose Monitoring Suppl (ACURA BLOOD GLUCOSE METER) W/DEVICE KIT Use as directed to check sugars daily 250.42 1 kit 0   CALCIUM-MAGNESIUM PO Take 1 tablet by mouth daily.     carvedilol (COREG) 25 MG tablet Take 1 tablet (25 mg total) by mouth 2 (two) times daily with a meal. 60 tablet 0   Cholecalciferol (VITAMIN D-3) 5000 UNITS TABS Take 1 tablet by mouth daily.     isosorbide dinitrate (ISORDIL) 20 MG tablet Take 1 tablet (20 mg total) by mouth daily. 90 tablet 3   levothyroxine (SYNTHROID) 88  MCG tablet Take 1 tablet (88 mcg total) by mouth daily. 90 each 3   pentoxifylline (TRENTAL) 400 MG CR tablet Take 1 tablet (400 mg total) by mouth daily. 90 tablet 3   saxagliptin HCl (ONGLYZA) 2.5 MG TABS tablet Take 1 tablet (2.5 mg total) by mouth daily. 30 tablet 11   sodium bicarbonate 650 MG tablet Take 650 mg by mouth 2 (two) times daily as needed for heartburn.     apixaban (ELIQUIS) 5 MG TABS tablet Take 1 tablet (5 mg total) by mouth 2 (two) times daily. 60  tablet 0   pantoprazole (PROTONIX) 40 MG tablet Take 1 tablet (40 mg total) by mouth daily as needed. 90 each 1   acetaminophen (TYLENOL) 325 MG tablet Take 1 tablet (325 mg total) by mouth every 6 (six) hours as needed for mild pain (or Fever >/= 101).     ferrous sulfate (SLOW IRON) 160 (50 Fe) MG TBCR SR tablet Take 1 tablet (160 mg total) by mouth daily. (Patient not taking: Reported on 11/24/2021) 90 each 3   No facility-administered medications prior to visit.     Per HPI unless specifically indicated in ROS section below Review of Systems  Objective:  BP 134/66   Pulse 66 Comment: on manual check  Temp (!) 97.4 F (36.3 C) (Temporal)   Ht _0  (1.626 m)   Wt 186 lb 2 oz (84.4 kg)   SpO2 96% Comment: RA  BMI 31.95 kg/m   Wt Readings from Last 3 Encounters:  11/25/21 186 lb 2 oz (84.4 kg)  11/24/21 186 lb (84.4 kg)  11/15/21 190 lb (86.2 kg)      Physical Exam Vitals and nursing note reviewed.  Constitutional:      Appearance: Normal appearance. She is not ill-appearing.     Comments: In whelchair today  Neurological:     Mental Status: She is alert.      Results for orders placed or performed during the hospital encounter of 89/38/10  Basic metabolic panel  Result Value Ref Range   Sodium 142 135 - 145 mmol/L   Potassium 5.0 3.5 - 5.1 mmol/L   Chloride 111 98 - 111 mmol/L   CO2 20 (L) 22 - 32 mmol/L   Glucose, Bld 123 (H) 70 - 99 mg/dL   BUN 39 (H) 8 - 23 mg/dL   Creatinine, Ser 4.72 (H) 0.44 - 1.00 mg/dL   Calcium 9.4 8.9 - 10.3 mg/dL   GFR, Estimated 9 (L) >60 mL/min   Anion gap 11 5 - 15  CBC  Result Value Ref Range   WBC 5.5 4.0 - 10.5 K/uL   RBC 3.55 (L) 3.87 - 5.11 MIL/uL   Hemoglobin 10.2 (L) 12.0 - 15.0 g/dL   HCT 34.1 (L) 36.0 - 46.0 %   MCV 96.1 80.0 - 100.0 fL   MCH 28.7 26.0 - 34.0 pg   MCHC 29.9 (L) 30.0 - 36.0 g/dL   RDW 14.6 11.5 - 15.5 %   Platelets 131 (L) 150 - 400 K/uL   nRBC 0.0 0.0 - 0.2 %  D-dimer, quantitative  Result Value  Ref Range   D-Dimer, Quant 5.72 (H) 0.00 - 0.50 ug/mL-FEU  TSH  Result Value Ref Range   TSH 2.284 0.350 - 4.500 uIU/mL  Hepatic function panel  Result Value Ref Range   Total Protein 7.5 6.5 - 8.1 g/dL   Albumin 3.8 3.5 - 5.0 g/dL   AST 15 15 - 41 U/L  ALT 10 0 - 44 U/L   Alkaline Phosphatase 70 38 - 126 U/L   Total Bilirubin 0.5 0.3 - 1.2 mg/dL   Bilirubin, Direct 0.1 0.0 - 0.2 mg/dL   Indirect Bilirubin 0.4 0.3 - 0.9 mg/dL  Magnesium  Result Value Ref Range   Magnesium 1.7 1.7 - 2.4 mg/dL  T4, free  Result Value Ref Range   Free T4 1.09 0.61 - 1.12 ng/dL  APTT  Result Value Ref Range   aPTT 128 (H) 24 - 36 seconds  CBC  Result Value Ref Range   WBC 5.8 4.0 - 10.5 K/uL   RBC 3.27 (L) 3.87 - 5.11 MIL/uL   Hemoglobin 9.6 (L) 12.0 - 15.0 g/dL   HCT 31.9 (L) 36.0 - 46.0 %   MCV 97.6 80.0 - 100.0 fL   MCH 29.4 26.0 - 34.0 pg   MCHC 30.1 30.0 - 36.0 g/dL   RDW 14.6 11.5 - 15.5 %   Platelets 116 (L) 150 - 400 K/uL   nRBC 0.0 0.0 - 0.2 %  Heparin level (unfractionated)  Result Value Ref Range   Heparin Unfractionated 0.70 0.30 - 0.70 IU/mL  Lipid panel  Result Value Ref Range   Cholesterol 177 0 - 200 mg/dL   Triglycerides 64 <150 mg/dL   HDL 60 >40 mg/dL   Total CHOL/HDL Ratio 3.0 RATIO   VLDL 13 0 - 40 mg/dL   LDL Cholesterol 104 (H) 0 - 99 mg/dL  Basic metabolic panel  Result Value Ref Range   Sodium 142 135 - 145 mmol/L   Potassium 4.6 3.5 - 5.1 mmol/L   Chloride 115 (H) 98 - 111 mmol/L   CO2 19 (L) 22 - 32 mmol/L   Glucose, Bld 112 (H) 70 - 99 mg/dL   BUN 39 (H) 8 - 23 mg/dL   Creatinine, Ser 4.78 (H) 0.44 - 1.00 mg/dL   Calcium 9.2 8.9 - 10.3 mg/dL   GFR, Estimated 9 (L) >60 mL/min   Anion gap 8 5 - 15  Heparin level (unfractionated)  Result Value Ref Range   Heparin Unfractionated 0.86 (H) 0.30 - 0.70 IU/mL  Heparin level (unfractionated)  Result Value Ref Range   Heparin Unfractionated >1.10 (H) 0.30 - 0.70 IU/mL  CBC  Result Value Ref Range   WBC  6.7 4.0 - 10.5 K/uL   RBC 3.18 (L) 3.87 - 5.11 MIL/uL   Hemoglobin 9.5 (L) 12.0 - 15.0 g/dL   HCT 30.4 (L) 36.0 - 46.0 %   MCV 95.6 80.0 - 100.0 fL   MCH 29.9 26.0 - 34.0 pg   MCHC 31.3 30.0 - 36.0 g/dL   RDW 14.6 11.5 - 15.5 %   Platelets 106 (L) 150 - 400 K/uL   nRBC 0.0 0.0 - 0.2 %  Glucose, capillary  Result Value Ref Range   Glucose-Capillary 159 (H) 70 - 99 mg/dL  Glucose, capillary  Result Value Ref Range   Glucose-Capillary 82 70 - 99 mg/dL  Glucose, capillary  Result Value Ref Range   Glucose-Capillary 111 (H) 70 - 99 mg/dL  Glucose, capillary  Result Value Ref Range   Glucose-Capillary 171 (H) 70 - 99 mg/dL  CBG monitoring, ED  Result Value Ref Range   Glucose-Capillary 142 (H) 70 - 99 mg/dL   Comment 1 Document in Chart   CBG monitoring, ED  Result Value Ref Range   Glucose-Capillary 103 (H) 70 - 99 mg/dL  CBG monitoring, ED  Result Value Ref Range  Glucose-Capillary 100 (H) 70 - 99 mg/dL  ECHOCARDIOGRAM COMPLETE  Result Value Ref Range   Weight 3,040 oz   Height 64 in   BP 116/87 mmHg   Ao pk vel 1.14 m/s   AV Area VTI 2.52 cm2   AR max vel 2.34 cm2   AV Mean grad 3.3 mmHg   AV Peak grad 5.2 mmHg   S' Lateral 2.30 cm   AV Area mean vel 2.14 cm2   Area-P 1/2 3.23 cm2   MV VTI 2.20 cm2  Troponin I (High Sensitivity)  Result Value Ref Range   Troponin I (High Sensitivity) 25 (H) <18 ng/L  Troponin I (High Sensitivity)  Result Value Ref Range   Troponin I (High Sensitivity) 24 (H) <18 ng/L    Assessment & Plan:   Problem List Items Addressed This Visit     CKD stage 5 due to type 2 diabetes mellitus ([place]) - Primary   Relevant Orders   Renal function panel   Anemia   Relevant Orders   CBC with Differential/Platelet   Ferritin   IBC panel     Meds ordered this encounter  Medications   pantoprazole (PROTONIX) 40 MG tablet    Sig: Take 1 tablet (40 mg total) by mouth daily as needed.    Dispense:  90 tablet    Refill:  1   apixaban  (ELIQUIS) 5 MG TABS tablet    Sig: Take 1 tablet (5 mg total) by mouth 2 (two) times daily.    Dispense:  60 tablet    Refill:  6   Orders Placed This Encounter  Procedures   CBC with Differential/Platelet   Renal function panel   Ferritin   IBC panel     Patient Instructions  Labs today.  Continue current medicines.  Let us know if blood pressures drops <110/60 or heart rate drops <50.  I will ask home health to come out to the house for evaluation (physical therapy).  Good to see you today. Return in the fall for medicare wellness visit.   Follow up plan: Return in about 5 months (around 04/27/2022) for medicare wellness visit.  Ria Bush, MD

## 2021-11-25 NOTE — Patient Instructions (Addendum)
Labs today.  Continue current medicines.  Let us know if blood pressures drops <110/60 or heart rate drops <50.  I will ask home health to come out to the house for evaluation (physical therapy).  Good to see you today. Return in the fall for medicare wellness visit.

## 2021-11-25 NOTE — Telephone Encounter (Signed)
Critical results called by S[facility] at [facility].  Creatinine 5.16    GFR  7.52 Notified Dr Danise Mina in person.

## 2021-11-26 ENCOUNTER — Other Ambulatory Visit: Payer: Self-pay | Admitting: Family Medicine

## 2021-11-26 DIAGNOSIS — R531 Weakness: Secondary | ICD-10-CM | POA: Insufficient documentation

## 2021-11-26 NOTE — Assessment & Plan Note (Signed)
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

## 2021-11-26 NOTE — Assessment & Plan Note (Signed)
Closely followed by renal.  PTH levels increasing - planning kidney education classes through [facility]. Update renal panel after recent hospitalization

## 2021-11-26 NOTE — Assessment & Plan Note (Addendum)
Worsened fatigue, weakness, sleepiness since hospitalization. ?effect of higher dose beta blocker. Pulse oximeter pulse falsely low - on manual recheck 66. Discussed monitoring pulse at home and let us know if consistently <50, to consider lower coreg dose.  Will also refer to [facility] after recent hospitalization.

## 2021-11-26 NOTE — Assessment & Plan Note (Signed)
Stable period on carvedilol '25mg'$  bid, saw cardiology yesterday with commencement of amlodipine '5mg'$ . Stable blood pressures. Advised continue to monitor this at home.

## 2021-11-26 NOTE — Assessment & Plan Note (Signed)
Anticipate anemia of CKD.  Update iron panel.

## 2021-11-26 NOTE — Addendum Note (Signed)
Addended by: Ria Bush on: 11/26/2021 11:19 AM   Modules accepted: Orders

## 2021-11-26 NOTE — Assessment & Plan Note (Addendum)
Progressive deterioration, she is on daily 5000 IU cholecalciferol not on calcitriol, followed by renal.  Check phosphate levels - normal.

## 2021-11-26 NOTE — Assessment & Plan Note (Signed)
Has been started on eliquis, coreg dose increased, for parox Afib. [facility] cardiology in f/u, appreciate their care of patient.

## 2021-11-29 ENCOUNTER — Telehealth: Payer: Self-pay | Admitting: Cardiology

## 2021-11-29 NOTE — Telephone Encounter (Signed)
Pt c/o medication issue:  1. Name of Medication:   amLODipine (NORVASC) 5 MG tablet    carvedilol (COREG) 25 MG tablet    2. How are you currently taking this medication (dosage and times per day)?   3. Are you having a reaction (difficulty breathing--STAT)? No  4. What is your medication issue? Pt's daughter states that between the two medications pt has become very tired, sleeping a lot, and has no energy. Pt's daughter would like a callback. Please advise

## 2021-11-29 NOTE — Telephone Encounter (Signed)
Spoke with patients daughter Santiago Glad and she stated that she was worried about how tired her mom has been since starting the Amlodipine 5 MG. Her BP on the day of her OV with Dr. Garen Lah was 160/60. Santiago Glad stated that the next day they had an appointment with her PCP and her BP was 135 over something, that was after taking the Amlodipine. I advised her to check her moms BP and send me in some readings through MyChart so that I can follow up with Dr. Garen Lah tomorrow. She agreed with plan. She also stated that she informed the patients Kidney doctor of the fatigue as they had advised that she might start developing fatigue and they would reassess if was time to start dialysis.   Will discuss with Dr. Garen Lah and follow up with Santiago Glad.

## 2021-11-30 ENCOUNTER — Encounter: Payer: Self-pay | Admitting: Cardiology

## 2021-12-01 MED ORDER — CARVEDILOL 25 MG PO TABS: 12.5000 mg | ORAL_TABLET | Freq: Two times a day (BID) | ORAL | 0 refills | Status: DC

## 2021-12-01 NOTE — Telephone Encounter (Signed)
Spoke with Dr. Garen Lah and he recommended that the patient decrease her Carvedilol to 12.5 mg twice a day and see if her symptoms improve. Also continue to monitor BP daily. If symptoms do not improve then likely it was not not due to the medication and patient should be further evaluated for dialysis.  Called and spoke with patients daughter Santiago Glad and gave her the recommendations as stated above. She verbalized understanding and said that she would send [place] a log of patients daily blood pressures once a week.  Santiago Glad was very grateful for the follow up.

## 2021-12-27 DIAGNOSIS — E113312 Type 2 diabetes mellitus with moderate nonproliferative diabetic retinopathy with macular edema, left eye: Secondary | ICD-10-CM | POA: Diagnosis not present

## 2021-12-28 ENCOUNTER — Ambulatory Visit (INDEPENDENT_AMBULATORY_CARE_PROVIDER_SITE_OTHER): Payer: Medicare Other

## 2021-12-28 VITALS — Ht 63.0 in | Wt 185.0 lb

## 2021-12-28 DIAGNOSIS — Z0000 Encounter for general adult medical examination without abnormal findings: Secondary | ICD-10-CM | POA: Diagnosis not present

## 2021-12-28 NOTE — Patient Instructions (Signed)
Alexis Tucker , Thank you for taking time to come for your Medicare Wellness Visit. I appreciate your ongoing commitment to your health goals. Please review the following plan we discussed and let me know if I can assist you in the future.   Screening recommendations/referrals: Colonoscopy: not required Mammogram: completed 09/28/2021 Bone Density: completed 09/25/2017 Recommended yearly ophthalmology/optometry visit for glaucoma screening and checkup Recommended yearly dental visit for hygiene and checkup  Vaccinations: Influenza vaccine: due 01/31/2022 Pneumococcal vaccine: completed 03/13/2014 Tdap vaccine: due Shingles vaccine: discussed   Covid-19: 05/06/2021, 06/08/2020, 08/28/2019, 08/07/2019  Advanced directives: Advance directive discussed with you today. .  Conditions/risks identified: none  Next appointment: Follow up in one year for your annual wellness visit    Preventive Care 79 Years and Older, Female Preventive care refers to lifestyle choices and visits with your health care provider that can promote health and wellness. What does preventive care include? A yearly physical exam. This is also called an annual well check. Dental exams once or twice a year. Routine eye exams. Ask your health care provider how often you should have your eyes checked. Personal lifestyle choices, including: Daily care of your teeth and gums. Regular physical activity. Eating a healthy diet. Avoiding tobacco and drug use. Limiting alcohol use. Practicing safe sex. Taking low-dose aspirin every day. Taking vitamin and mineral supplements as recommended by your health care provider. What happens during an annual well check? The services and screenings done by your health care provider during your annual well check will depend on your age, overall health, lifestyle risk factors, and family history of disease. Counseling  Your health care provider may ask you questions about your: Alcohol  use. Tobacco use. Drug use. Emotional well-being. Home and relationship well-being. Sexual activity. Eating habits. History of falls. Memory and ability to understand (cognition). Work and work Statistician. Reproductive health. Screening  You may have the following tests or measurements: Height, weight, and BMI. Blood pressure. Lipid and cholesterol levels. These may be checked every 5 years, or more frequently if you are over 12 years old. Skin check. Lung cancer screening. You may have this screening every year starting at age 33 if you have a 30-pack-year history of smoking and currently smoke or have quit within the past 15 years. Fecal occult blood test (FOBT) of the stool. You may have this test every year starting at age 29. Flexible sigmoidoscopy or colonoscopy. You may have a sigmoidoscopy every 5 years or a colonoscopy every 10 years starting at age 36. Hepatitis C blood test. Hepatitis B blood test. Sexually transmitted disease (STD) testing. Diabetes screening. This is done by checking your blood sugar (glucose) after you have not eaten for a while (fasting). You may have this done every 1-3 years. Bone density scan. This is done to screen for osteoporosis. You may have this done starting at age 40. Mammogram. This may be done every 1-2 years. Talk to your health care provider about how often you should have regular mammograms. Talk with your health care provider about your test results, treatment options, and if necessary, the need for more tests. Vaccines  Your health care provider may recommend certain vaccines, such as: Influenza vaccine. This is recommended every year. Tetanus, diphtheria, and acellular pertussis (Tdap, Td) vaccine. You may need a Td booster every 10 years. Zoster vaccine. You may need this after age 55. Pneumococcal 13-valent conjugate (PCV13) vaccine. One dose is recommended after age 32. Pneumococcal polysaccharide (PPSV23) vaccine. One dose is  recommended after age 63. Talk to your health care provider about which screenings and vaccines you need and how often you need them. This information is not intended to replace advice given to you by your health care provider. Make sure you discuss any questions you have with your health care provider. Document Released: 07/16/2015 Document Revised: 03/08/2016 Document Reviewed: 04/20/2015 Elsevier Interactive Patient Education  2017 [place] Prevention in the Home Falls can cause injuries. They can happen to people of all ages. There are many things you can do to make your home safe and to help prevent falls. What can I do on the outside of my home? Regularly fix the edges of walkways and driveways and fix any cracks. Remove anything that might make you trip as you walk through a door, such as a raised step or threshold. Trim any bushes or trees on the path to your home. Use bright outdoor lighting. Clear any walking paths of anything that might make someone trip, such as rocks or tools. Regularly check to see if handrails are loose or broken. Make sure that both sides of any steps have handrails. Any raised decks and porches should have guardrails on the edges. Have any leaves, snow, or ice cleared regularly. Use sand or salt on walking paths during winter. Clean up any spills in your garage right away. This includes oil or grease spills. What can I do in the bathroom? Use night lights. Install grab bars by the toilet and in the tub and shower. Do not use towel bars as grab bars. Use non-skid mats or decals in the tub or shower. If you need to sit down in the shower, use a plastic, non-slip stool. Keep the floor dry. Clean up any water that spills on the floor as soon as it happens. Remove soap buildup in the tub or shower regularly. Attach bath mats securely with double-sided non-slip rug tape. Do not have throw rugs and other things on the floor that can make you  trip. What can I do in the bedroom? Use night lights. Make sure that you have a light by your bed that is easy to reach. Do not use any sheets or blankets that are too big for your bed. They should not hang down onto the floor. Have a firm chair that has side arms. You can use this for support while you get dressed. Do not have throw rugs and other things on the floor that can make you trip. What can I do in the kitchen? Clean up any spills right away. Avoid walking on wet floors. Keep items that you use a lot in easy-to-reach places. If you need to reach something above you, use a strong step stool that has a grab bar. Keep electrical cords out of the way. Do not use floor polish or wax that makes floors slippery. If you must use wax, use non-skid floor wax. Do not have throw rugs and other things on the floor that can make you trip. What can I do with my stairs? Do not leave any items on the stairs. Make sure that there are handrails on both sides of the stairs and use them. Fix handrails that are broken or loose. Make sure that handrails are as long as the stairways. Check any carpeting to make sure that it is firmly attached to the stairs. Fix any carpet that is loose or worn. Avoid having throw rugs at the top or bottom of the stairs. If you  do have throw rugs, attach them to the floor with carpet tape. Make sure that you have a light switch at the top of the stairs and the bottom of the stairs. If you do not have them, ask someone to add them for you. What else can I do to help prevent falls? Wear shoes that: Do not have high heels. Have rubber bottoms. Are comfortable and fit you well. Are closed at the toe. Do not wear sandals. If you use a stepladder: Make sure that it is fully opened. Do not climb a closed stepladder. Make sure that both sides of the stepladder are locked into place. Ask someone to hold it for you, if possible. Clearly mark and make sure that you can  see: Any grab bars or handrails. First and last steps. Where the edge of each step is. Use tools that help you move around (mobility aids) if they are needed. These include: Canes. Walkers. Scooters. Crutches. Turn on the lights when you go into a dark area. Replace any light bulbs as soon as they burn out. Set up your furniture so you have a clear path. Avoid moving your furniture around. If any of your floors are uneven, fix them. If there are any pets around you, be aware of where they are. Review your medicines with your doctor. Some medicines can make you feel dizzy. This can increase your chance of falling. Ask your doctor what other things that you can do to help prevent falls. This information is not intended to replace advice given to you by your health care provider. Make sure you discuss any questions you have with your health care provider. Document Released: 04/15/2009 Document Revised: 11/25/2015 Document Reviewed: 07/24/2014 Elsevier Interactive Patient Education  2017 [employer].

## 2021-12-28 NOTE — Progress Notes (Signed)
I connected with Alexis Tucker today by telephone and verified that I am speaking with the correct person using two identifiers. Location patient: home Location provider: work Persons participating in the virtual visit: Meleena Munroe, Glenna Durand LPN.   I discussed the limitations, risks, security and privacy concerns of performing an evaluation and management service by telephone and the availability of in person appointments. I also discussed with the patient that there may be a patient responsible charge related to this service. The patient expressed understanding and verbally consented to this telephonic visit.    Interactive audio and video telecommunications were attempted between this provider and patient, however failed, due to patient having technical difficulties OR patient did not have access to video capability.  We continued and completed visit with audio only.     Vital signs may be patient reported or missing.  Subjective:   Alexis Tucker is a 81 y.o. female who presents for Medicare Annual (Subsequent) preventive examination.  Review of Systems     Cardiac Risk Factors include: advanced age (>62men, >83 women);diabetes mellitus;hypertension;obesity (BMI >30kg/m2)     Objective:    Today's Vitals   12/28/21 1241  Weight: 185 lb (83.9 kg)  Height: $Remove'5\' 3"'MeJIVGR$  (1.6 m)   Body mass index is 32.77 kg/m.     12/28/2021   12:45 PM 11/15/2021    3:22 PM 10/19/2021   10:32 AM 10/10/2021   11:28 AM 12/25/2018    1:08 PM 04/17/2018   11:04 AM 01/28/2018    2:13 PM  Advanced Directives  Does Patient Have a Medical Advance Directive? No No No No Yes No No  Type of Personnel officer;Living will    Does patient want to make changes to medical advance directive?     No - Patient declined    Copy of [place] in Chart?     No - copy requested    Would patient like information on creating a medical advance directive?  No - Patient  declined No - Patient declined   No - Patient declined No - Patient declined    Current Medications (verified) Outpatient Encounter Medications as of 12/28/2021  Medication Sig   allopurinol (ZYLOPRIM) 100 MG tablet Take 1 tablet (100 mg total) by mouth daily.   amLODipine (NORVASC) 5 MG tablet Take 1 tablet (5 mg total) by mouth daily.   anastrozole (ARIMIDEX) 1 MG tablet Take 1 tablet (1 mg total) by mouth daily.   apixaban (ELIQUIS) 5 MG TABS tablet Take 1 tablet (5 mg total) by mouth 2 (two) times daily.   Blood Glucose Monitoring Suppl (ACURA BLOOD GLUCOSE METER) W/DEVICE KIT Use as directed to check sugars daily 250.42   CALCIUM-MAGNESIUM PO Take 1 tablet by mouth daily.   carvedilol (COREG) 25 MG tablet Take 0.5 tablets (12.5 mg total) by mouth 2 (two) times daily with a meal.   Cholecalciferol (VITAMIN D-3) 5000 UNITS TABS Take 1 tablet by mouth daily.   isosorbide dinitrate (ISORDIL) 20 MG tablet Take 1 tablet (20 mg total) by mouth daily.   levothyroxine (SYNTHROID) 88 MCG tablet TAKE 1 TABLET BY MOUTH EVERY DAY   pantoprazole (PROTONIX) 40 MG tablet Take 1 tablet (40 mg total) by mouth daily as needed.   pentoxifylline (TRENTAL) 400 MG CR tablet Take 1 tablet (400 mg total) by mouth daily.   saxagliptin HCl (ONGLYZA) 2.5 MG TABS tablet Take 1 tablet (2.5 mg total) by mouth daily.  sodium bicarbonate 650 MG tablet Take 650 mg by mouth 2 (two) times daily as needed for heartburn.   No facility-administered encounter medications on file as of 12/28/2021.    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  2.5 cm maximum diameter, multifocal.  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   Personal history of radiation therapy 2008   F/U Left Breast Cancer   Personal history of radiation therapy 2019   mammosite left breast ca   PONV (postoperative nausea and vomiting)    Post-surgical hypothyroidism    Wears dentures    partial upper   Past Surgical History:  Procedure Laterality Date   ABDOMINAL HYSTERECTOMY  1976   partial, after tubal pregnancy, h/o fibroids with heavy bleeding   BREAST BIOPSY Left 2008   positive   BREAST BIOPSY Left 11/22/2017   2oclock 2cmfn wing shaped clip, INVASIVE MAMMARY CARCINOMA   BREAST BIOPSY Left 11/22/2017   2oclock 4cmfn coil shaped clip, INVASIVE MAMMARY CARCINOMA   BREAST BIOPSY Left 11/22/2017   lymph node - butterfly hydroMARK   BREAST EXCISIONAL BIOPSY Left 01/07/2018   lumpectomy by Dr. Lemar Livings   BREAST LUMPECTOMY Left 2008   F/U radiation   BREAST LUMPECTOMY Left 2019   2 areas of [facility] at 2:00 position f/u with mammosite   BREAST LUMPECTOMY WITH SENTINEL LYMPH NODE BIOPSY Left 01/07/2018   Procedure: BREAST LUMPECTOMY WITH SENTINEL LYMPH NODE BX;  Surgeon: Earline Mayotte, MD;  Location: [facility] ORS;  Service: General;  Laterality: Left;    BREAST MAMMOSITE  2008   carotid ultrasound  12/2012   1-39% bilateral stenosis, severe in ECA   CATARACT EXTRACTION W/PHACO Left 08/14/2016   Procedure: CATARACT EXTRACTION PHACO AND INTRAOCULAR LENS PLACEMENT (IOC)  Left Diabetic;  Surgeon: Sherald Hess, MD;  Location: [facility];  Service: Ophthalmology;  Laterality: Left;  Diabetic - oral meds   CHOLECYSTECTOMY  2005   COLECTOMY  2005   colon cancer   COLON SURGERY  2006   bowel obstruction   COLONOSCOPY  2008   ext hem, ileo-colonic anastomosis in cecum, tubular adenoma x1, rec rpt 1 yr for multiple polyps (in [place])  COLONOSCOPY WITH PROPOFOL N/A 10/19/2021   2.5cm TVA with low grade dysplasia, diverticulosis, no rpt recommended Vicente Males, Bailey Mech, MD)   ESOPHAGOGASTRODUODENOSCOPY N/A 05/06/2016   Procedure: ESOPHAGOGASTRODUODENOSCOPY (EGD);  Surgeon: Manya Silvas, MD;  Location: [facility]COPY;  Service: Endoscopy;  Laterality: N/A;   EYE SURGERY     JOINT REPLACEMENT     THYROIDECTOMY, PARTIAL     unclear why   TOTAL KNEE ARTHROPLASTY Right 1999   US ECHOCARDIOGRAPHY  11/2007   nl LV fxn EF 03%, diastolic dysfunction, LVH, tr TR and MR, slcerotic aortic valve   Family History  Problem Relation Age of Onset   Diabetes Mother    Hypertension Mother    Arthritis Mother    Alcohol abuse Father    Arthritis Father    Stroke Daughter    CAD Neg Hx    Cancer Neg Hx    Breast cancer Neg Hx    Social History   Socioeconomic History   Marital status: Divorced    Spouse name: Not on file   Number of children: Not on file   Years of education: Not on file   Highest education level: Not on file  Occupational History   Not on file  Tobacco Use   Smoking status: Former   Smokeless tobacco: Never   Tobacco comments:    quit over 40 yrs ago  Vaping Use   Vaping Use: Never used  Substance and Sexual Activity   Alcohol use: Never    Comment: rare   Drug use: Never   Sexual activity: Not Currently  Other  Topics Concern   Not on file  Social History Narrative   Lives alone, 1 dog.   Granddaughter in [place] (attorney).   Divorced   Occu: retired, worked in Engineer, technical sales   Edu: college   Social Determinants of Radio broadcast assistant Strain: [place]  (12/28/2021)   Overall Financial Resource Strain (CARDIA)    Difficulty of Paying Living Expenses: Not hard at all  Food Insecurity: No Food Insecurity (12/28/2021)   Hunger Vital Sign    Worried About Running Out of Food in the Last Year: Never true    [place] in the Last Year: Never true  Transportation Needs: No Transportation Needs (12/28/2021)   PRAPARE - Hydrologist (Medical): No    Lack of Transportation (Non-Medical): No  Physical Activity: Inactive (12/28/2021)   Exercise Vital Sign    Days of Exercise per Week: 0 days    Minutes of Exercise per Session: 0 min  Stress: No Stress Concern Present (12/28/2021)   [place]    Feeling of Stress : Not at all  Social Connections: Not on file    Tobacco Counseling Counseling given: Not Answered Tobacco comments: quit over 40 yrs ago   Clinical Intake:  Pre-visit preparation completed: Yes  Pain : No/denies pain     Nutritional Status: BMI > 30  Obese Nutritional Risks: None Diabetes: Yes  How often do you need to have someone help you when you read instructions, pamphlets, or other written materials from your doctor or pharmacy?: 1 - Never  Diabetic? Yes Nutrition Risk Assessment:  Has the patient had any N/V/D within the last 2 months?  No  Does the patient have any non-healing wounds?  No  Has the patient had any unintentional weight loss or weight gain?  No   Diabetes:  Is  the patient diabetic?  Yes  If diabetic, was a CBG obtained today?  No  Did the patient bring in their glucometer from home?  No  How often do you monitor your CBG's? daily.   Financial Strains and  Diabetes Management:  Are you having any financial strains with the device, your supplies or your medication? No .  Does the patient want to be seen by Chronic Care Management for management of their diabetes?  No  Would the patient like to be referred to a Nutritionist or for Diabetic Management?  No   Diabetic Exams:  Diabetic Eye Exam: Overdue for diabetic eye exam. Pt has been advised about the importance in completing this exam. Patient advised to call and schedule an eye exam. Diabetic Foot Exam: Overdue, Pt has been advised about the importance in completing this exam. Pt is scheduled for diabetic foot exam on next appointment.   Interpreter Needed?: No  Information entered by :: NAllen LPN   Activities of Daily Living    12/28/2021   12:46 PM 11/17/2021   11:46 AM  In your present state of health, do you have any difficulty performing the following activities:  Hearing? 0   Vision? 0   Difficulty concentrating or making decisions? 0   Walking or climbing stairs? 0   Dressing or bathing? 0   Doing errands, shopping? 1 1  Preparing Food and eating ? N   Using the Toilet? N   In the past six months, have you accidently leaked urine? N   Do you have problems with loss of bowel control? N   Managing your Medications? Y   Managing your Finances? N   Housekeeping or managing your Housekeeping? N     Patient Care Team: Ria Bush, MD as PCP - General (Family Medicine)  Indicate any recent Medical Services you may have received from other than [facility] providers in the past year (date may be approximate).     Assessment:   This is a routine wellness examination for Alexis Tucker.  Hearing/Vision screen Vision Screening - Comments:: Regular eye exams, Dr. Posey Pronto  Dietary issues and exercise activities discussed: Current Exercise Habits: The patient does not participate in regular exercise at present   Goals Addressed             This Visit's Progress    Patient  Stated       12/28/2021, no goals       Depression Screen    12/28/2021   12:46 PM 06/18/2020    9:08 AM 12/25/2018   12:39 PM 01/28/2018    2:14 PM 11/02/2016    2:59 PM 07/27/2015    4:06 PM 03/13/2014    1:59 PM  PHQ 2/9 Scores  PHQ - 2 Score 0 0 0 0 0 0 0  PHQ- 9 Score   0        Fall Risk    12/28/2021   12:46 PM 06/18/2020    9:07 AM 01/07/2019    1:04 PM 12/25/2018   12:39 PM 01/28/2018    2:14 PM  [place] in the past year? 0 0 0 0 No  Number falls in past yr: 0      Injury with Fall? 0      Risk for fall due to : Impaired balance/gait;Medication side effect      Follow up Falls evaluation completed;Education provided;Falls prevention discussed        FALL RISK PREVENTION PERTAINING TO  THE HOME:  Any stairs in or around the home? Yes  If so, are there any without handrails? No  Home free of loose throw rugs in walkways, pet beds, electrical cords, etc? Yes  Adequate lighting in your home to reduce risk of falls? Yes   ASSISTIVE DEVICES UTILIZED TO PREVENT FALLS:  Life alert? No  Use of a cane, walker or w/c? Yes  Grab bars in the bathroom? No  Shower chair or bench in shower? Yes  Elevated toilet seat or a handicapped toilet? No   TIMED UP AND GO:  Was the test performed? No .      Cognitive Function:    12/25/2018    1:06 PM 11/02/2016    3:00 PM  MMSE - Mini Mental State Exam  Orientation to time 5 5  Orientation to Place 5 5  Registration 3 3  Attention/ Calculation 0 0  Recall 3 3  Language- name 2 objects 0 0  Language- repeat 1 1  Language- follow 3 step command 0 3  Language- read & follow direction 0 0  Write a sentence 0 0  Copy design 0 0  Total score 17 20        12/28/2021   12:48 PM  6CIT Screen  What Year? 4 points  What month? 0 points  What time? 3 points  Count back from 20 0 points  Months in reverse 4 points  Repeat phrase 10 points  Total Score 21 points    Immunizations Immunization History  Administered  Date(s) Administered   Fluad Quad(high Dose 65+) 03/20/2020   Influenza, High Dose Seasonal PF 05/26/2017, 04/23/2018, 02/25/2019, 02/25/2019, 04/05/2021   Influenza,inj,Quad PF,6+ Mos 03/13/2014, 07/27/2015   Influenza-Unspecified 05/26/2017   PFIZER(Purple Top)SARS-COV-2 Vaccination 08/07/2019, 08/28/2019, 06/08/2020   Pfizer Covid-19 Vaccine Bivalent Booster 67yrs & up 05/06/2021   Pneumococcal Conjugate-13 03/13/2014   Pneumococcal Polysaccharide-23 07/03/2009    TDAP status: Due, Education has been provided regarding the importance of this vaccine. Advised may receive this vaccine at local pharmacy or Health Dept. Aware to provide a copy of the vaccination record if obtained from local pharmacy or Health Dept. Verbalized acceptance and understanding.  Flu Vaccine status: Up to date  Pneumococcal vaccine status: Up to date  Covid-19 vaccine status: Completed vaccines  Qualifies for Shingles Vaccine? Yes   Zostavax completed No   Shingrix Completed?: No.    Education has been provided regarding the importance of this vaccine. Patient has been advised to call insurance company to determine out of pocket expense if they have not yet received this vaccine. Advised may also receive vaccine at local pharmacy or Health Dept. Verbalized acceptance and understanding.  Screening Tests Health Maintenance  Topic Date Due   FOOT EXAM  Never done   Zoster Vaccines- Shingrix (1 of 2) Never done   OPHTHALMOLOGY EXAM  06/07/2019   TETANUS/TDAP  11/03/2026 (Originally 03/16/1960)   INFLUENZA VACCINE  01/31/2022   HEMOGLOBIN A1C  03/22/2022   MAMMOGRAM  09/29/2022   Pneumonia Vaccine 68+ Years old  Completed   DEXA SCAN  Completed   COVID-19 Vaccine  Completed   Hepatitis C Screening  Completed   HPV VACCINES  Aged Out   COLONOSCOPY (Pts 45-20yrs Insurance coverage will need to be confirmed)  Discontinued    Health Maintenance  Health Maintenance Due  Topic Date Due   FOOT EXAM  Never  done   Zoster Vaccines- Shingrix (1 of 2) Never done   OPHTHALMOLOGY EXAM  06/07/2019    Colorectal cancer screening: No longer required.   Mammogram status: Completed 09/28/2021. Repeat every year  Bone Density status: Completed 09/25/2017.   Lung Cancer Screening: (Low Dose CT Chest recommended if Age 35-80 years, 30 pack-year currently smoking OR have quit w/in 15years.) does not qualify.   Lung Cancer Screening Referral: no  Additional Screening:  Hepatitis C Screening: does qualify; Completed 01/11/2012  Vision Screening: Recommended annual ophthalmology exams for early detection of glaucoma and other disorders of the eye. Is the patient up to date with their annual eye exam?  Yes  Who is the provider or what is the name of the office in which the patient attends annual eye exams? Dr. Posey Pronto If pt is not established with a provider, would they like to be referred to a provider to establish care? No .   Dental Screening: Recommended annual dental exams for proper oral hygiene  Community Resource Referral / Chronic Care Management: CRR required this visit?  No   CCM required this visit?  No      Plan:     I have personally reviewed and noted the following in the patient's chart:   Medical and social history Use of alcohol, tobacco or illicit drugs  Current medications and supplements including opioid prescriptions.  Functional ability and status Nutritional status Physical activity Advanced directives List of other physicians Hospitalizations, surgeries, and ER visits in previous 12 months Vitals Screenings to include cognitive, depression, and falls Referrals and appointments  In addition, I have reviewed and discussed with patient certain preventive protocols, quality metrics, and best practice recommendations. A written personalized care plan for preventive services as well as general preventive health recommendations were provided to patient.     Kellie Simmering, LPN   7/74/1423   Nurse Notes: none  Due to this being a virtual visit, the after visit summary with patients personalized plan was offered to patient via mail or my-chart.  Patient would like to access on my-chart

## 2022-01-09 ENCOUNTER — Ambulatory Visit: Payer: Medicare Other | Admitting: Gastroenterology

## 2022-01-10 ENCOUNTER — Telehealth: Payer: Self-pay | Admitting: *Deleted

## 2022-01-10 NOTE — Chronic Care Management (AMB) (Signed)
  Care Coordination  Note  01/10/2022 Name: Alexis Tucker MRN: [id number] DOB: May 28, 1941  Alexis Tucker is a 81 y.o. year old female who is a primary care patient of Ria Bush, MD. I reached out to [name] by phone today to offer care coordination services.      [name] was given information about Care Coordination services today including:  The Care Coordination services include support from the care team which includes your Nurse Coordinator, Clinical Social Worker, or Pharmacist.  The Care Coordination team is here to help remove barriers to the health concerns and goals most important to you. Care Coordination services are voluntary and the patient may decline or stop services at any time by request to their care team member.   Patient agreed to services and verbal consent obtained.   Follow up plan: Telephone appointment with care coordination team member scheduled for: 01/11/2022  Julian Hy, [place] Direct Dial: [phone]

## 2022-01-11 ENCOUNTER — Ambulatory Visit: Payer: Self-pay

## 2022-01-11 NOTE — Patient Outreach (Signed)
  Care Coordination   {Initialfollowuphomecarecoordination:27762} Visit Note   01/11/2022 Name: Vaughn Frieze MRN: [id number] DOB: [birth year]  Arbie Reisz is a 81 y.o. year old female who sees Ria Bush, MD for primary care. I {TYPECCVISIT:27766}  What matters to the patients health and wellness today?  ***   Goals Addressed   None     SDOH assessments and interventions completed:   {yes/no:20286}   Care Coordination Interventions Activated:  {CCYES/NO:27769} Care Coordination Interventions:   {INTERVENTIONS:27767}  Follow up plan: {CCFOLLOWUP:27768}  Encounter Outcome:  {ENCOUTCOME:27770}

## 2022-01-17 ENCOUNTER — Other Ambulatory Visit (INDEPENDENT_AMBULATORY_CARE_PROVIDER_SITE_OTHER): Payer: Self-pay | Admitting: Nurse Practitioner

## 2022-01-17 DIAGNOSIS — N186 End stage renal disease: Secondary | ICD-10-CM

## 2022-01-18 ENCOUNTER — Ambulatory Visit (INDEPENDENT_AMBULATORY_CARE_PROVIDER_SITE_OTHER): Payer: Medicare Other

## 2022-01-18 ENCOUNTER — Ambulatory Visit (INDEPENDENT_AMBULATORY_CARE_PROVIDER_SITE_OTHER): Payer: Medicare Other | Admitting: Nurse Practitioner

## 2022-01-18 ENCOUNTER — Encounter (INDEPENDENT_AMBULATORY_CARE_PROVIDER_SITE_OTHER): Payer: Self-pay | Admitting: Nurse Practitioner

## 2022-01-18 VITALS — BP 188/65 | HR 71 | Resp 16 | Ht 63.0 in | Wt 182.0 lb

## 2022-01-18 DIAGNOSIS — N185 Chronic kidney disease, stage 5: Secondary | ICD-10-CM

## 2022-01-18 DIAGNOSIS — E1169 Type 2 diabetes mellitus with other specified complication: Secondary | ICD-10-CM

## 2022-01-18 DIAGNOSIS — N186 End stage renal disease: Secondary | ICD-10-CM | POA: Diagnosis not present

## 2022-01-18 DIAGNOSIS — I10 Essential (primary) hypertension: Secondary | ICD-10-CM | POA: Diagnosis not present

## 2022-01-18 DIAGNOSIS — E1122 Type 2 diabetes mellitus with diabetic chronic kidney disease: Secondary | ICD-10-CM | POA: Diagnosis not present

## 2022-01-19 DIAGNOSIS — N185 Chronic kidney disease, stage 5: Secondary | ICD-10-CM | POA: Diagnosis not present

## 2022-01-19 DIAGNOSIS — D631 Anemia in chronic kidney disease: Secondary | ICD-10-CM | POA: Diagnosis not present

## 2022-01-19 DIAGNOSIS — E1122 Type 2 diabetes mellitus with diabetic chronic kidney disease: Secondary | ICD-10-CM | POA: Diagnosis not present

## 2022-01-19 DIAGNOSIS — I129 Hypertensive chronic kidney disease with stage 1 through stage 4 chronic kidney disease, or unspecified chronic kidney disease: Secondary | ICD-10-CM | POA: Diagnosis not present

## 2022-01-19 DIAGNOSIS — N2581 Secondary hyperparathyroidism of renal origin: Secondary | ICD-10-CM | POA: Diagnosis not present

## 2022-01-20 ENCOUNTER — Telehealth: Payer: Self-pay

## 2022-01-20 MED ORDER — ANASTROZOLE 1 MG PO TABS: 1.0000 mg | ORAL_TABLET | Freq: Every day | ORAL | 1 refills | Status: DC

## 2022-01-20 NOTE — Telephone Encounter (Signed)
Patient daughter called asking for Anastrozole refill.  Spoke with Dr.Pabon -ok to give refill.  (Dr.Piscoya on [address])I let daughter know she can pick up at pharmacy. Scheduled follow up with Dr.Piscoya 02/08/22 @ 11:15 am.

## 2022-01-23 ENCOUNTER — Telehealth (INDEPENDENT_AMBULATORY_CARE_PROVIDER_SITE_OTHER): Payer: Self-pay

## 2022-01-23 NOTE — Telephone Encounter (Signed)
Spoke with the patient's daughter Santiago Glad to discuss the patient having  a left UE AV graft surgery with Dr. Lucky Cowboy on 02/01/22 at the [facility]. Pre-op phone call is on 01/26/22 between 8-1 pm. Pre-surgical instructions were discussed and will be mailed.

## 2022-01-26 ENCOUNTER — Other Ambulatory Visit: Payer: Self-pay

## 2022-01-26 ENCOUNTER — Telehealth: Payer: Self-pay | Admitting: *Deleted

## 2022-01-26 ENCOUNTER — Encounter
Admission: RE | Admit: 2022-01-26 | Discharge: 2022-01-26 | Disposition: A | Discharge status: Home / Self Care | Payer: Medicare Other | Source: Ambulatory Visit | Attending: Vascular Surgery | Admitting: Vascular Surgery

## 2022-01-26 ENCOUNTER — Encounter: Payer: Self-pay | Admitting: Vascular Surgery

## 2022-01-26 ENCOUNTER — Other Ambulatory Visit (INDEPENDENT_AMBULATORY_CARE_PROVIDER_SITE_OTHER): Payer: Self-pay | Admitting: Nurse Practitioner

## 2022-01-26 VITALS — Ht 63.0 in | Wt 181.0 lb

## 2022-01-26 DIAGNOSIS — N186 End stage renal disease: Secondary | ICD-10-CM

## 2022-01-26 DIAGNOSIS — E113312 Type 2 diabetes mellitus with moderate nonproliferative diabetic retinopathy with macular edema, left eye: Secondary | ICD-10-CM | POA: Diagnosis not present

## 2022-01-26 DIAGNOSIS — E1122 Type 2 diabetes mellitus with diabetic chronic kidney disease: Secondary | ICD-10-CM

## 2022-01-26 DIAGNOSIS — E1169 Type 2 diabetes mellitus with other specified complication: Secondary | ICD-10-CM

## 2022-01-26 HISTORY — DX: Unspecified atrial fibrillation: I48.91

## 2022-01-26 NOTE — Telephone Encounter (Signed)
Left message for pt to call the office to schedule a URGENT TELE PRE OP APP ADD ON TOMORROW. See notes.

## 2022-01-26 NOTE — Telephone Encounter (Signed)
Pt's daughter returning call.

## 2022-01-26 NOTE — Telephone Encounter (Signed)
Patient with diagnosis of afib dx 11/15/21 admission, spontaneously converted to sinus rhythm with diltiazem drip, started on Eliquis at that time.  Procedure: INSERTION OF ARTERIOVENOUS (AV) GORE-TEX GRAFT ARM (LEFT UPPER EXTREMITY) Date of procedure: 02/01/22  CHA2DS2-VASc Score = 8  This indicates a 10.8% annual risk of stroke. 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  CrCl 50m/min Platelet count 111K  Per office protocol, patient can hold Eliquis for 2 days prior to procedure.    **This guidance is not considered finalized until pre-operative APP has relayed final recommendations.**

## 2022-01-26 NOTE — Telephone Encounter (Signed)
URGENT tele pre op add-on per pre op APP. Pt has been scheduled for 01/27/22 @ 3:20. Med rec and consent are done.

## 2022-01-26 NOTE — Telephone Encounter (Signed)
   Name: Alexis Tucker  DOB: [birth year]  MRN: [id number]  Primary Cardiologist: None   Preoperative team, please contact this patient and set up a phone call appointment for further preoperative risk assessment. Please obtain consent and complete medication review. Thank you for your help.  I confirm that guidance regarding antiplatelet and oral anticoagulation therapy has been completed and, if necessary, noted below.  Request has been sent to pharmacy for recommendations on holding Eliquis prior to procedure.  However, given urgent nature of procedure, we will go ahead and schedule patient for virtual telephone visit while pharmacy recommendations are pending.  Lenna Sciara, NP 01/26/2022, 3:39 PM [facility] [address] [place], [place] [address]

## 2022-01-26 NOTE — Patient Instructions (Addendum)
Your procedure is scheduled on: 02/01/22 Report to [facility]. To find out your arrival time please call [phone] between 1PM - 3PM on 01/31/22 .  Remember: Instructions that are not followed completely may result in serious medical risk, up to and including death, or upon the discretion of your surgeon and anesthesiologist your surgery may need to be rescheduled.     _X__ 1. Do not eat any food or drink any liquids after midnight the night before your procedure.                 No gum chewing or hard candies.   __X__2.  On the morning of surgery brush your teeth with toothpaste and water, you                 may rinse your mouth with mouthwash if you wish.  Do not swallow any              toothpaste of mouthwash.     _X__ 3.  No Alcohol for 24 hours before or after surgery.   _X__ 4.  Do Not Smoke or use e-cigarettes For 24 Hours Prior to Your Surgery.                 Do not use any chewable tobacco products for at least 6 hours prior to                 surgery.  ____  5.  Bring all medications with you on the day of surgery if instructed.   __X__  6.  Notify your doctor if there is any change in your medical condition      (cold, fever, infections).     Do not wear jewelry, make-up, hairpins, clips or nail polish. Do not wear lotions, powders, or perfumes. No deodorant Do not shave body hair 48 hours prior to surgery. Men may shave face and neck. Do not bring valuables to the hospital.    [facility] is not responsible for any belongings or valuables.  Contacts, dentures/partials or body piercings may not be worn into surgery. Bring a case for your contacts, glasses or hearing aids, a denture cup will be supplied. Leave your suitcase in the car. After surgery it may be brought to your room. For patients admitted to the hospital, discharge time is determined by your treatment team.   Patients discharged the day of surgery  will not be allowed to drive home.    __X__ Take these medicines the morning of surgery with A SIP OF WATER:    1. allopurinol (ZYLOPRIM) 100 MG tablet  2. amLODipine (NORVASC) 5 MG tablet  3. anastrozole (ARIMIDEX) 1 MG tablet  4. carvedilol (COREG) 25 MG tablet (1/2 tablet)  5. isosorbide dinitrate (ISORDIL) 20 MG tablet  6. levothyroxine (SYNTHROID) 88 MCG tablet  7. pantoprazole (PROTONIX) 40 MG tablet  ____ Fleet Enema (as directed)   ____ Use CHG Soap/SAGE wipes as directed  ____ Use inhalers on the day of surgery  ____ Stop metformin/Janumet/Farxiga 2 days prior to surgery    ____ Take 1/2 of usual insulin dose the night before surgery. No insulin the morning          of surgery.   ____ Stop Blood Thinners Coumadin/Plavix/Xarelto/Pleta/Pradaxa/Eliquis/Effient/Aspirin  on   Or contact your Surgeon, Cardiologist or Medical Doctor regarding  ability to stop your blood thinners  __X__ Stop Anti-inflammatories 7 days before surgery  such as Advil, Ibuprofen, Motrin,  BC or Goodies Powder, Naprosyn, Naproxen, Aleve, Aspirin   MAY TAKE TYLENOL IF NEEDED  __X__ Stop all herbals and supplements, fish oil or vitamins  until after surgery.    ____ Bring C-Pap to the hospital.    SHOWER THE NIGHT BEFORE AND THE MORNING OF SURGERY

## 2022-01-26 NOTE — Telephone Encounter (Signed)
URGENT tele pre op add-on per pre op APP. Pt has been scheduled for 01/27/22 @ 3:20. Med rec and consent are done.     Patient Consent for Virtual Visit        Alexis Tucker has provided verbal consent on 01/26/2022 for a virtual visit (video or telephone).   CONSENT FOR VIRTUAL VISIT FOR:  Alexis Tucker  By participating in this virtual visit I agree to the following:  I hereby voluntarily request, consent and authorize [facility] and its employed or contracted physicians, physician assistants, nurse practitioners or other licensed health care professionals (the Practitioner), to provide me with telemedicine health care services (the "Services") as deemed necessary by the treating Practitioner. I acknowledge and consent to receive the Services by the Practitioner via telemedicine. I understand that the telemedicine visit will involve communicating with the Practitioner through live audiovisual communication technology and the disclosure of certain medical information by electronic transmission. I acknowledge that I have been given the opportunity to request an in-person assessment or other available alternative prior to the telemedicine visit and am voluntarily participating in the telemedicine visit.  I understand that I have the right to withhold or withdraw my consent to the use of telemedicine in the course of my care at any time, without affecting my right to future care or treatment, and that the Practitioner or I may terminate the telemedicine visit at any time. I understand that I have the right to inspect all information obtained and/or recorded in the course of the telemedicine visit and may receive copies of available information for a reasonable fee.  I understand that some of the potential risks of receiving the Services via telemedicine include:  Delay or interruption in medical evaluation due to technological equipment failure or disruption; Information transmitted may not be  sufficient (e.g. poor resolution of images) to allow for appropriate medical decision making by the Practitioner; and/or  In rare instances, security protocols could fail, causing a breach of personal health information.  Furthermore, I acknowledge that it is my responsibility to provide information about my medical history, conditions and care that is complete and accurate to the best of my ability. I acknowledge that Practitioner's advice, recommendations, and/or decision may be based on factors not within their control, such as incomplete or inaccurate data provided by me or distortions of diagnostic images or specimens that may result from electronic transmissions. I understand that the practice of medicine is not an exact science and that Practitioner makes no warranties or guarantees regarding treatment outcomes. I acknowledge that a copy of this consent can be made available to me via my patient portal ([facility]), or I can request a printed copy by calling the office of [facility].    I understand that my insurance will be billed for this visit.   I have read or had this consent read to me. I understand the contents of this consent, which adequately explains the benefits and risks of the Services being provided via telemedicine.  I have been provided ample opportunity to ask questions regarding this consent and the Services and have had my questions answered to my satisfaction. I give my informed consent for the services to be provided through the use of telemedicine in my medical care

## 2022-01-26 NOTE — Telephone Encounter (Signed)
-----   Message from Karen Kitchens, NP sent at 01/26/2022  3:29 PM EDT ----- Regarding: Request for pre-operative cardiac clearance Request for pre-operative cardiac clearance:  1. What type of surgery is being performed?  INSERTION OF ARTERIOVENOUS (AV) GORE-TEX GRAFT ARM (LEFT UPPER EXTREMITY)  2. When is this surgery scheduled?  02/01/2022  3. Type of clearance being requested (medical, pharmacy, both). BOTH   4. Are there any medications that need to be held prior to surgery? APIXABAN  5. Practice name and name of physician performing surgery?  Performing surgeon: Dr. Leotis Pain, MD Requesting clearance: Honor Loh, FNP-C    6. Anesthesia type (none, local, MAC, general)? GENERAL  7. What is the office phone and fax number?   Phone: [phone] Fax: [phone]  ATTENTION: Unable to create telephone message as per your standard workflow. Directed by [facility] providers to send requests for cardiac clearance to this pool for appropriate distribution to provider covering pre-operative clearances.   Honor Loh, MSN, APRN, FNP-C, CEN [facility]  Peri-operative Services Nurse Practitioner Phone: [phone] 01/26/22 3:29 PM

## 2022-01-27 ENCOUNTER — Other Ambulatory Visit (INDEPENDENT_AMBULATORY_CARE_PROVIDER_SITE_OTHER): Payer: Self-pay | Admitting: Nurse Practitioner

## 2022-01-27 ENCOUNTER — Encounter (INDEPENDENT_AMBULATORY_CARE_PROVIDER_SITE_OTHER): Payer: Self-pay

## 2022-01-27 ENCOUNTER — Ambulatory Visit (INDEPENDENT_AMBULATORY_CARE_PROVIDER_SITE_OTHER): Payer: Medicare Other | Admitting: Nurse Practitioner

## 2022-01-27 ENCOUNTER — Encounter: Payer: Self-pay | Admitting: Vascular Surgery

## 2022-01-27 DIAGNOSIS — Z01810 Encounter for preprocedural cardiovascular examination: Secondary | ICD-10-CM | POA: Diagnosis not present

## 2022-01-27 NOTE — Telephone Encounter (Signed)
I spoke with the patient's daughter and given the new information, we will place graft in right arm instead of the left.  Can we adjust the listed arm on what is scheduled.

## 2022-01-27 NOTE — Progress Notes (Signed)
Perioperative Services  Pre-Admission/Anesthesia Testing Clinical Review  Date: 01/30/22  Patient Demographics:  Name: Alexis Tucker DOB:   12/11/1940 MRN:   [id number]  Planned Surgical Procedure(s):    Case: [id number] Date/Time: 02/01/22 1257   Procedure: INSERTION OF ARTERIOVENOUS (AV) GORE-TEX GRAFT ARM (LEFT UPPER EXTREMITY) (Left)   Anesthesia type: General   Pre-op diagnosis: ESRD   Location: [facility] OR ROOM 07 / [facility] FOR ANEST[facility]ESIA [facility]   Surgeons: Algernon Huxley, MD   NOTE: Available PAT nursing documentation and vital signs have been reviewed. Clinical nursing staff has updated patient's PMH/PSHx, current medication list, and drug allergies/intolerances to ensure comprehensive history available to assist in medical decision making as it pertains to the aforementioned surgical procedure and anticipated anesthetic course. Extensive review of available clinical information performed. Alexis Tucker PMH and PSHx updated with any diagnoses/procedures that  may have been inadvertently omitted during her intake with the pre-admission testing department's nursing staff.  Clinical Discussion:  Alexis Tucker is a 81 y.o. female who is submitted for pre-surgical anesthesia review and clearance prior to her undergoing the above procedure. Patient has never been a smoker. Pertinent PMH includes: CAD, CHF, atrial fibrillation, aortic stenosis, carotid stenosis, CVA, HTN, HLD, T2DM, hypothyroidism, ESRD, GERD (on daily PPI), anemia, thrombocytopenia, glaucoma, OA, DJD, neuropathy, LEFT breast cancer  Patient is followed by cardiology Garen Lah, MD). She was last seen in the cardiology clinic on 11/24/2021; notes reviewed.  At the time of her clinic visit, patient doing well overall from a cardiovascular perspective.  She denied any episodes of chest pain, shortness breath, PND, orthopnea, significant peripheral edema, vertiginous symptoms, or presyncope/syncope.  Patient previously seen in the ED  approximately 2 weeks prior to visit with cardiology for atrial fibrillation with RVR with rates in the 120s.  Patient was treated with a diltiazem drip, which prompted spontaneous conversion to sinus rhythm.  Since that time, patient reports that she has been doing well overall.  Patient with a past medical history significant for cardiovascular diagnoses.  Patient with a history of remote cerebral infarcts noted on CT imaging performed on 08/17/2017.  Infarcts appear to have occurred in the RIGHT parietotemporal and LEFT pons areas.  BILATERAL carotid duplex study performed on 07/26/2020 revealed 1-39% stenosis in the BILATERAL internal carotid arteries.  BILATERAL PCAs appear to be >50% stenosed.  There was antegrade flow noted in the BILATERAL vertebral arteries.  Normal flow hemodynamics noted within the subclavian arteries BILATERALLY.  Most recent TTE was performed on 11/16/2021 revealing a normal left ventricular systolic function with an EF 55-60%.  There was mild LVH. Diastolic Doppler parameters consistent with pseudonormalization (G2DD). Right ventricle mildly enlarged.  Left atrium mild to moderately dilated.  There was mild mitral valve regurgitation with moderate mitral annular calcification.  There was mild aortic valve stenosis noted with a mean pressure gradient of 3.3 mmHg; AVA (VTI) = 2.52 cm.  Patient with an atrial fibrillation diagnosis; CHA2DS2-VASc Score = 9 (age x 2, sex, CHF, HTN, CVA x 2, vascular disease history, T2DM). Her rate and rhythm are currently being maintained on oral carvedilol. She is chronically anticoagulated using apixaban + pentoxifylline; reported to be compliant with therapy with no evidence or reports of GI bleeding.  Blood pressure elevated at 160/60 on currently prescribed nitrate (isosorbide dinitrate) and beta-blocker (carvedilol) therapies.  Patient not currently taking any type of prescribed lipid-lowering therapies for her HLD diagnosis or ASCVD  prevention. T2DM well controlled on currently prescribed regimen; last HgbA1c was 5.7%  when checked on 09/19/2021. Functional capacity, as defined by DASI, is documented as being >/= 4 METS.  Given elevated blood pressure, CCB (amlodipine) was added.  No other changes were made to her daily medication regimen. Patient to follow-up with outpatient cardiology in 3 months or sooner if needed.  Alexis Tucker is scheduled for an INSERTION OF ARTERIOVENOUS (AV) GORE-TEX GRAFT ARM (LEFT UPPER EXTREMITY) on 02/01/2022 with Dr. Leotis Pain, MD.  Given patient's past medical history significant for cardiovascular diagnoses, presurgical cardiac clearance was sought by the PAT team.  Per cardiology, "According to the [facility], her perioperative Risk of MACE is 6.6%. Her functional capacity in METs is 4.64 according to the DASI. Therefore, based on ACC/AHA guidelines, patient would be at an ACCEPTABLE risk for the planned procedure without further cardiovascular testing". Again, this patient is on daily anticoagulation therapy.  She has been instructed on recommendations from cardiology for holding her apixaban for 2 days prior to her procedure with plans to restart as soon as postoperative bleeding risk felt to be minimized by her primary attending surgeon.  Patient is aware that her last dose of apixaban should be on 01/29/2022.  Patient reports previous perioperative complications with anesthesia in the past. Patient has a PMH (+) for PONV. Symptoms and history of PONV will be discussed with patient by anesthesia team on the day of her procedure. Interventions will be ordered as deemed necessary based on patient's individual care needs as determined by anesthesiologist.  In review of the available records, it is noted that patient underwent a general anesthetic course here at [facility] ([facility]) in 10/2021 without documented complications.      01/26/2022    1:18 PM 01/18/2022   10:40 AM  12/28/2021   12:41 PM  Vitals with BMI  Height _0  _1  _2   Weight 181 lbs 182 lbs 185 lbs  BMI 32.07 90.24 09.73  Systolic  532   Diastolic  65   Pulse  71     Providers/Specialists:   NOTE: Primary physician provider listed below. Patient may have been seen by APP or partner within same practice.   PROVIDER ROLE / SPECIALTY LAST OV  Algernon Huxley, MD Vascular Surgery (Surgeon) ???  Ria Bush, MD Primary Care Provider 11/25/2021  Kate Sable, MD Cardiology 11/24/2021   Allergies:  Tramadol, Codeine, and Sulfa antibiotics  Current Home Medications:   No current facility-administered medications for this encounter.    allopurinol (ZYLOPRIM) 100 MG tablet   amLODipine (NORVASC) 5 MG tablet   anastrozole (ARIMIDEX) 1 MG tablet   apixaban (ELIQUIS) 5 MG TABS tablet   Blood Glucose Monitoring Suppl (ACURA BLOOD GLUCOSE METER) W/DEVICE KIT   CALCIUM-MAGNESIUM PO   carvedilol (COREG) 25 MG tablet   Cholecalciferol (VITAMIN D-3) 5000 UNITS TABS   isosorbide dinitrate (ISORDIL) 20 MG tablet   levothyroxine (SYNTHROID) 88 MCG tablet   pantoprazole (PROTONIX) 40 MG tablet   pentoxifylline (TRENTAL) 400 MG CR tablet   saxagliptin HCl (ONGLYZA) 2.5 MG TABS tablet   History:   Past Medical History:  Diagnosis Date   Anemia    Aortic stenosis 11/16/2021   a.) TTE 11/16/2021: EF 55-60%, mild AS (MPF 3.3 mmHg); AVA (VTI) = 2.52 cm   Atrial fibrillation (HCC)    a.) CHA2DS2-VASc = 9 (age x 2, sex, CHF, HTN, CVA x2, vascular disease history, T2DM);  b.) rate/rhythm maintained on oral carvedilol; chronically anticoagulated using apixban   Breast cancer,  left ([place]) 09/19/2006   a.) moderately differentiated Optima (G2, T1N0Mx); s/p lumpectomy + adjuvant XRT   Breast cancer, left ([place]) 11/27/2017   a.) [place] (mpT2 pN0, G2, ER/PR positive, HER-2/neu negative); b.) mammosite   Carotid stenosis, asymptomatic 01/10/2013   a.) carotid dopplers 01/10/2013, 02/27/2014,  09/10/2017, 07/26/2020: >50% BECA, 1-39% BICA   Cataracts, bilateral    Chronic diastolic CHF (congestive heart failure) ([place])    a.) TTE 11/26/2007: EF 60%, LVH, triv TR/MR, AoV sclerosis with no stenosis, G1DD; b.) TTE 05/04/2016: EF 60-65%, LAE, G1DD; c.) TTE 11/16/2021: EF 55-60%, LVH, RVE, mild-mod LAE, mod MAC, mod MR, mild TR/AR, G2DD.   Coronary artery calcification seen on CT scan    CVA (cerebral vascular accident) ([place])    a.) remotes infarct(s) noted on CT imaging from 08/17/2017 --> RIGHT parietotemporal infarct; lacunar infarct LEFT pons   Diabetes type 2, controlled ([place]) 2000s   Diabetic retinopathy with macular edema ([place]) 03/2014   a.) Avastin injections as [place]   DJD (degenerative joint disease)    ESRD (end stage renal disease) ([place])    GERD (gastroesophageal reflux disease)    Glaucoma 03/2014   History of colon cancer 2006   a.) s/p colectomy   History of hepatitis A 1990s   from food, resolved   HTN (hypertension)    Hyperplastic colon polyp    Long term current use of anticoagulant    a.) apixaban   Long term current use of antithrombotics/antiplatelets    a.) pentoxifylline   Macular degeneration    wet R with vision loss, dry L; to see retina specialist   Neuropathy    OA (osteoarthritis)    Osteonecrosis (HCC)    hips?   PONV (postoperative nausea and vomiting)    Post-surgical hypothyroidism    Thrombocytopenia (HCC)    Tubular adenoma    Wears dentures    partial upper   Past Surgical History:  Procedure Laterality Date   ABDOMINAL HYSTERECTOMY  1976   partial, after tubal pregnancy, h/o fibroids with heavy bleeding   BREAST BIOPSY Left 2008   positive   BREAST BIOPSY Left 11/22/2017   2oclock 2cmfn wing shaped clip, INVASIVE MAMMARY CARCINOMA   BREAST BIOPSY Left 11/22/2017   2oclock 4cmfn coil shaped clip, INVASIVE MAMMARY CARCINOMA   BREAST BIOPSY Left 11/22/2017   lymph node - butterfly hydroMARK   BREAST EXCISIONAL  BIOPSY Left 01/07/2018   lumpectomy by Dr. Bary Castilla   BREAST LUMPECTOMY Left 2008   F/U radiation   BREAST LUMPECTOMY Left 2019   2 areas of [facility] at 2:00 position f/u with mammosite   BREAST LUMPECTOMY WITH SENTINEL LYMPH NODE BIOPSY Left 01/07/2018   Procedure: BREAST LUMPECTOMY WITH SENTINEL LYMPH NODE BX;  Surgeon: Robert Bellow, MD;  Location: [facility] ORS;  Service: General;  Laterality: Left;   BREAST MAMMOSITE  2008   carotid ultrasound  12/2012   1-39% bilateral stenosis, severe in ECA   CATARACT EXTRACTION W/PHACO Left 08/14/2016   Procedure: CATARACT EXTRACTION PHACO AND INTRAOCULAR LENS PLACEMENT ([place])  Left Diabetic;  Surgeon: Ronnell Freshwater, MD;  Location: [place];  Service: Ophthalmology;  Laterality: Left;  Diabetic - oral meds   CHOLECYSTECTOMY  2005   COLECTOMY  2005   colon cancer   COLON SURGERY  2006   bowel obstruction   COLONOSCOPY  2008   ext hem, ileo-colonic anastomosis in cecum, tubular adenoma x1, rec rpt 1 yr for multiple polyps (in [place])  COLONOSCOPY WITH PROPOFOL N/A 10/19/2021   2.5cm TVA with low grade dysplasia, diverticulosis, no rpt recommended Vicente Males, Bailey Mech, MD)   ESOPHAGOGASTRODUODENOSCOPY N/A 05/06/2016   Procedure: ESOPHAGOGASTRODUODENOSCOPY (EGD);  Surgeon: Manya Silvas, MD;  Location: [facility] ENDOSCOPY;  Service: Endoscopy;  Laterality: N/A;   EYE SURGERY     JOINT REPLACEMENT     THYROIDECTOMY, PARTIAL     unclear why   TOTAL KNEE ARTHROPLASTY Right 1999   US ECHOCARDIOGRAPHY  11/2007   nl LV fxn EF 03%, diastolic dysfunction, LVH, tr TR and MR, slcerotic aortic valve   Family History  Problem Relation Age of Onset   Diabetes Mother    Hypertension Mother    Arthritis Mother    Alcohol abuse Father    Arthritis Father    Stroke Daughter    CAD Neg Hx    Cancer Neg Hx    Breast cancer Neg Hx    Social History   Tobacco Use   Smoking status: Former   Smokeless tobacco: Never   Tobacco comments:    quit over  40 yrs ago  Vaping Use   Vaping Use: Never used  Substance Use Topics   Alcohol use: Never    Comment: rare   Drug use: Never    Pertinent Clinical Results:  LABS: Labs reviewed: Acceptable for surgery.   Ref Range & Units 01/19/2022  WBC 3.8 - 10.8 Thousand/uL 5.1   RBC 3.80 - 5.10 Million/uL 3.43 Low    Hemoglobin 11.7 - 15.5 g/dL 10.3 Low    Hematocrit 35.0 - 45.0 % 32.4 Low    MCV 80.0 - 100.0 fL 94.5   MCH 27.0 - 33.0 pg 30.0   MCHC 32.0 - 36.0 g/dL 31.8 Low    RDW 11.0 - 15.0 % 14.4   Platelets 140 - 400 Thousand/uL 138 Low    MPV 7.5 - 12.5 fL 12.0   Resulting Agency  [facility]    Ref Range & Units 01/19/2022  Glucose 65 - 139 mg/dL 107   BUN 7 - 25 mg/dL 35 High    Creatinine 0.60 - 1.00 mg/dL 5.24 High    eGFR CKD-EPI CR 2021 > OR = 60 mL/min/1.75m 8 Low    BUN/Creatinine Ratio 6 - 22 (calc) 7   Sodium 135 - 146 mmol/L 143   Potassium 3.5 - 5.3 mmol/L 5.4 High    Chloride 98 - 110 mmol/L 114 High    Bicarbonate (CO2) 20 - 32 mmol/L 18 Low    Calcium 8.6 - 10.4 mg/dL 9.6   Phosphorus 2.1 - 4.3 mg/dL 4.1   Albumin 3.6 - 5.1 g/dL 3.9   Resulting Agency  [facility]    ECG: Date: 11/24/2021 Time ECG obtained: 1110 AM Rate: 65 bpm Rhythm: normal sinus Axis (leads I and aVF): Normal Intervals: PR 160 ms. QRS 76 ms. QTc 428 ms. ST segment and T wave changes: No evidence of acute ST segment elevation or depression Comparison: Previous tracing obtained on 11/15/2021 showed atrial fibrillation with RVR at a rate of 120 bpm    IMAGING / PROCEDURES: TRANSTHORACIC ECHOCARDIOGRAM performed on 11/16/2021 Left ventricular ejection fraction, by estimation, is 55 to 60%. The left ventricle has normal function. The left ventricle has no regional wall motion abnormalities. There is mild left ventricular hypertrophy. Left ventricular diastolic parameters are consistent with Grade II diastolic dysfunction (pseudonormalization).  Right ventricular systolic function is  normal. The right ventricular size is mildly enlarged.  Left atrial size was  mild to moderately dilated.  The mitral valve is degenerative. Mild mitral valve regurgitation. Moderate mitral annular calcification.  The aortic valve is calcified. Aortic valve regurgitation is mild. Mild aortic valve stenosis.  Mean pressure gradient 3.3 mmHg; peak gradient 5.2 mmHg.  AVA (VTI) = 2.52 cm  NM PULMONARY PERFUSION performed on 11/16/2021 Mildly heterogenous distribution of radiopharmaceutical without discrete segmental or subsegmental perfusion defect. Low likelihood ratio for pulmonary embolism.  DIAGNOSTIC RADIOGRAPHS OF CHEST 2 VIEWS performed on 11/15/2021 Cardiomediastinal contours and hilar structures with central pulmonary vascular engorgement suggested.  Mild rotation to the RIGHT, accounting for this heart size is stable. Lungs are clear.  Subtle nodular opacity over the RIGHT upper chest is likely an EKG leads sticker which is mainly radiolucent  No sign of pneumothorax. On limited assessment there is no acute skeletal finding. No acute cardiopulmonary process  VAS US CAROTID performed on 07/26/2020 Right Carotid: Velocities in the right ICA are consistent with a 1-39% stenosis. The ECA appears >50% stenosed. STABLE.  Left Carotid: Velocities in the left ICA are consistent with a 1-39% stenosis. The ECA appears >50% stenosed. STABLE.  Vertebrals:  Bilateral vertebral arteries demonstrate antegrade flow.  Subclavians: Normal flow hemodynamics were seen in bilateral subclavian arteries.   Impression and Plan:  Alexis Tucker has been referred for pre-anesthesia review and clearance prior to her undergoing the planned anesthetic and procedural courses. Available labs, pertinent testing, and imaging results were personally reviewed by me. This patient has been appropriately cleared by cardiology with an overall ACCEPTABLE risk of significant perioperative cardiovascular  complications.  Based on clinical review performed today (01/30/22), barring any significant acute changes in the patient's overall condition, it is anticipated that she will be able to proceed with the planned surgical intervention. Any acute changes in clinical condition may necessitate her procedure being postponed and/or cancelled. Patient will meet with anesthesia team (MD and/or CRNA) on the day of her procedure for preoperative evaluation/assessment. Questions regarding anesthetic course will be fielded at that time.   Pre-surgical instructions were reviewed with the patient during her PAT appointment and questions were fielded by PAT clinical staff. Patient was advised that if any questions or concerns arise prior to her procedure then she should return a call to PAT and/or her surgeon's office to discuss.  Honor Loh, MSN, APRN, FNP-C, CEN [facility]  Peri-operative Services Nurse Practitioner Phone: [phone] Fax: [phone] 01/30/22 10:09 AM  NOTE: This note has been prepared using Dragon dictation software. Despite my best ability to proofread, there is always the potential that unintentional transcriptional errors may still occur from this process.

## 2022-01-27 NOTE — Progress Notes (Signed)
Virtual Visit via Telephone Note   Because of Alexis Tucker's co-morbid illnesses, she is at least at moderate risk for complications without adequate follow up.  This format is felt to be most appropriate for this patient at this time.  The patient did not have access to video technology/had technical difficulties with video requiring transitioning to audio format only (telephone).  All issues noted in this document were discussed and addressed.  No physical exam could be performed with this format.  Please refer to the patient's chart for her consent to telehealth for [facility].  Evaluation Performed:  Preoperative cardiovascular risk assessment _____________   Date:  01/27/2022   Patient ID:  Alexis Tucker, DOB 1941/01/27, MRN [id number] Patient Location:  Home Provider location:   Office  Primary Care Provider:  Ria Bush, MD Primary Cardiologist:  Dr. Garen Lah  Chief Complaint / Patient Profile   81 y.o. 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 Leotis Pain and presents today for telephonic preoperative cardiovascular risk assessment.  Past Medical History    Past Medical History:  Diagnosis Date   Anemia    Aortic stenosis 11/16/2021   a.) TTE 11/16/2021: EF 55-60%, mild AS (MPF 3.3 mmHg); AVA (VTI) = 2.52 cm   Atrial fibrillation (HCC)    a.) CHA2DS2-VASc = 9 (age x 2, sex, CHF, HTN, CVA x2, vascular disease history, T2DM);  b.) rate/rhythm maintained on oral carvedilol; chronically anticoagulated using apixban   Breast cancer, left ([place]) 09/19/2006   a.) moderately differentiated [place] (G2, T1N0Mx); s/p lumpectomy + adjuvant XRT   Breast cancer, left ([place]) 11/27/2017   a.) [place] (mpT2 pN0, G2, ER/PR positive, HER-2/neu negative); b.) mammosite   Carotid  stenosis, asymptomatic 01/10/2013   a.) carotid dopplers 01/10/2013, 02/27/2014, 09/10/2017, 07/26/2020: >50% BECA, 1-39% BICA   Cataracts, bilateral    Chronic diastolic CHF (congestive heart failure) ([place])    a.) TTE 11/26/2007: EF 60%, LVH, triv TR/MR, AoV sclerosis with no stenosis, G1DD; b.) TTE 05/04/2016: EF 60-65%, LAE, G1DD; c.) TTE 11/16/2021: EF 55-60%, LVH, RVE, mild-mod LAE, mod MAC, mod MR, mild TR/AR, G2DD.   Coronary artery calcification seen on CT scan    CVA (cerebral vascular accident) ([place])    a.) remotes infarct(s) noted on CT imaging from 08/17/2017 --> RIGHT parietotemporal infarct; lacunar infarct LEFT pons   Diabetes type 2, controlled ([place]) 2000s   Diabetic retinopathy with macular edema ([place]) 03/2014   a.) Avastin injections as [place]   DJD (degenerative joint disease)    ESRD (end stage renal disease) ([place])    GERD (gastroesophageal reflux disease)    Glaucoma 03/2014   History of colon cancer 2006   a.) s/p colectomy   History of hepatitis A 1990s   from food, resolved   HTN (hypertension)    Hyperplastic colon polyp    Long term current use of anticoagulant    a.) apixaban   Long term current use of antithrombotics/antiplatelets    a.) pentoxifylline   Macular degeneration    wet R with vision loss, dry L; to see retina specialist   Neuropathy    OA (osteoarthritis)    Osteonecrosis (HCC)    hips?   PONV (postoperative nausea and vomiting)    Post-surgical hypothyroidism    Thrombocytopenia (HCC)  Tubular adenoma    Wears dentures    partial upper   Past Surgical History:  Procedure Laterality Date   ABDOMINAL HYSTERECTOMY  1976   partial, after tubal pregnancy, h/o fibroids with heavy bleeding   BREAST BIOPSY Left 2008   positive   BREAST BIOPSY Left 11/22/2017   2oclock 2cmfn wing shaped clip, INVASIVE MAMMARY CARCINOMA   BREAST BIOPSY Left 11/22/2017   2oclock 4cmfn coil shaped clip, INVASIVE MAMMARY CARCINOMA   BREAST  BIOPSY Left 11/22/2017   lymph node - butterfly hydroMARK   BREAST EXCISIONAL BIOPSY Left 01/07/2018   lumpectomy by Dr. Bary Castilla   BREAST LUMPECTOMY Left 2008   F/U radiation   BREAST LUMPECTOMY Left 2019   2 areas of [facility] at 2:00 position f/u with mammosite   BREAST LUMPECTOMY WITH SENTINEL LYMPH NODE BIOPSY Left 01/07/2018   Procedure: BREAST LUMPECTOMY WITH SENTINEL LYMPH NODE BX;  Surgeon: Robert Bellow, MD;  Location: [facility] ORS;  Service: General;  Laterality: Left;   BREAST MAMMOSITE  2008   carotid ultrasound  12/2012   1-39% bilateral stenosis, severe in ECA   CATARACT EXTRACTION W/PHACO Left 08/14/2016   Procedure: CATARACT EXTRACTION PHACO AND INTRAOCULAR LENS PLACEMENT ([place])  Left Diabetic;  Surgeon: Ronnell Freshwater, MD;  Location: [place];  Service: Ophthalmology;  Laterality: Left;  Diabetic - oral meds   CHOLECYSTECTOMY  2005   COLECTOMY  2005   colon cancer   COLON SURGERY  2006   bowel obstruction   COLONOSCOPY  2008   ext hem, ileo-colonic anastomosis in cecum, tubular adenoma x1, rec rpt 1 yr for multiple polyps (in [place])   COLONOSCOPY WITH PROPOFOL N/A 10/19/2021   2.5cm TVA with low grade dysplasia, diverticulosis, no rpt recommended Vicente Males, Bailey Mech, MD)   ESOPHAGOGASTRODUODENOSCOPY N/A 05/06/2016   Procedure: ESOPHAGOGASTRODUODENOSCOPY (EGD);  Surgeon: Manya Silvas, MD;  Location: [facility] ENDOSCOPY;  Service: Endoscopy;  Laterality: N/A;   EYE SURGERY     JOINT REPLACEMENT     THYROIDECTOMY, PARTIAL     unclear why   TOTAL KNEE ARTHROPLASTY Right 1999   US ECHOCARDIOGRAPHY  11/2007   nl LV fxn EF 33%, diastolic dysfunction, LVH, tr TR and MR, slcerotic aortic valve    Allergies  Allergies  Allergen Reactions   Tramadol Other (See Comments)    nightmares   Codeine Anxiety   Sulfa Antibiotics Itching and Rash    History of Present Illness    Alexis Tucker is a 81 y.o. female who presents via audio/video conferencing for a telehealth  visit today.  Pt was last seen in cardiology clinic on 11/24/2021 by Dr. Garen Lah.  At that time Alexis Tucker was doing well.  The patient is now pending procedure as outlined above. Since her last visit, she has been stable from a cardiac standpoint.She denies chest pain, palpitations, dyspnea, pnd, orthopnea, n, v, dizziness, syncope, edema, weight gain, or early satiety. All other systems reviewed and are otherwise negative except as noted above.   Home Medications    Prior to Admission medications   Medication Sig Start Date End Date Taking? Authorizing Provider  allopurinol (ZYLOPRIM) 100 MG tablet Take 1 tablet (100 mg total) by mouth daily. 07/14/21   Ria Bush, MD  amLODipine (NORVASC) 5 MG tablet Take 1 tablet (5 mg total) by mouth daily. 11/24/21 02/23/23  Kate Sable, MD  anastrozole (ARIMIDEX) 1 MG tablet Take 1 tablet (1 mg total) by mouth daily. 01/20/22   Jules Husbands, MD  apixaban (ELIQUIS) 5 MG TABS tablet Take 1 tablet (5 mg total) by mouth 2 (two) times daily. 11/25/21   Ria Bush, MD  Blood Glucose Monitoring Suppl [facility] BLOOD GLUCOSE METER) W/DEVICE KIT Use as directed to check sugars daily 250.42 03/13/14   Ria Bush, MD  CALCIUM-MAGNESIUM PO Take 1 tablet by mouth daily.    [provider]  carvedilol (COREG) 25 MG tablet Take 12.5 mg by mouth 2 (two) times daily with a meal.    [provider]  Cholecalciferol (VITAMIN D-3) 5000 UNITS TABS Take 1 tablet by mouth daily.    [provider]  isosorbide dinitrate (ISORDIL) 20 MG tablet Take 1 tablet (20 mg total) by mouth daily. 07/14/21   Ria Bush, MD  levothyroxine (SYNTHROID) 88 MCG tablet TAKE 1 TABLET BY MOUTH EVERY DAY 11/30/21   Ria Bush, MD  pantoprazole (PROTONIX) 40 MG tablet Take 1 tablet (40 mg total) by mouth daily as needed. 11/25/21   Ria Bush, MD  pentoxifylline (TRENTAL) 400 MG CR tablet Take 1 tablet (400 mg total) by mouth daily.  07/15/21   Ria Bush, MD  saxagliptin HCl (ONGLYZA) 2.5 MG TABS tablet Take 1 tablet (2.5 mg total) by mouth daily. 09/19/21   Ria Bush, MD    Physical Exam    Vital Signs:  Alexis Tucker does not have vital signs available for review today.  Given telephonic nature of communication, physical exam is limited. AAOx3. NAD. Normal affect.  Speech and respirations are unlabored.  Accessory Clinical Findings    None  Assessment & Plan    1.  Preoperative Cardiovascular Risk Assessment:  According to the Revised Cardiac Risk Index (RCRI), her Perioperative Risk of Major Cardiac Event is (%): 6.6. Her Functional Capacity in METs is: 4.64 according to the Duke Activity Status Index (DASI).Therefore, based on ACC/AHA guidelines, patient would be at acceptable risk for the planned procedure without further cardiovascular testing.   Patient with diagnosis of afib dx 11/15/21 admission, spontaneously converted to sinus rhythm with diltiazem drip, started on Eliquis at that time.   Procedure: INSERTION OF ARTERIOVENOUS (AV) GORE-TEX GRAFT ARM (LEFT UPPER EXTREMITY) Date of procedure: 02/01/22   CHA2DS2-VASc Score = 8  This indicates a 10.8% annual risk of stroke. 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   CrCl 84m/min Platelet count 111K   Per office protocol, patient can hold Eliquis for 2 days prior to procedure.  Please resume Eliquis as soon as possible postprocedure, at the discretion of the surgeon.  A copy of this note will be routed to requesting surgeon.  Time:   Today, I have spent 4 minutes with the patient with telehealth technology discussing medical history, symptoms, and management plan.     ELenna Sciara NP  01/27/2022, 3:36 PM

## 2022-01-31 ENCOUNTER — Encounter (INDEPENDENT_AMBULATORY_CARE_PROVIDER_SITE_OTHER): Payer: Self-pay | Admitting: Nurse Practitioner

## 2022-01-31 MED ORDER — CHLORHEXIDINE GLUCONATE CLOTH 2 % EX PADS
6.0000 | MEDICATED_PAD | Freq: Once | CUTANEOUS | Status: AC
  Administered 2022-02-01: 6 via TOPICAL

## 2022-01-31 MED ORDER — CHLORHEXIDINE GLUCONATE CLOTH 2 % EX PADS: 6.0000 | MEDICATED_PAD | Freq: Once | CUTANEOUS | Status: DC

## 2022-01-31 MED ORDER — CHLORHEXIDINE GLUCONATE 0.12 % MT SOLN: 15.0000 mL | Freq: Once | OROMUCOSAL | Status: DC

## 2022-01-31 MED ORDER — CEFAZOLIN SODIUM-DEXTROSE 2-4 GM/100ML-% IV SOLN
2.0000 g | INTRAVENOUS | Status: AC
  Administered 2022-02-01: 2 g via INTRAVENOUS

## 2022-01-31 MED ORDER — ORAL CARE MOUTH RINSE: 15.0000 mL | Freq: Once | OROMUCOSAL | Status: DC

## 2022-01-31 NOTE — H&P (View-Only) (Signed)
Subjective:    Patient ID: Alexis Tucker, female    DOB: 08-Feb-1941, 81 y.o.   MRN: [id number] Chief Complaint  Patient presents with   Establish Care    Referred by Dr Candiss Norse    The patient is seen for evaluation for dialysis access. 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 The patient's most recent creatinine clearance is less than 20. The patient volume status has not yet become an issue. Patient's blood pressures been relatively well controlled. There are mild uremic symptoms which appear to be relatively well tolerated at this time, however Dr. Candiss Norse is concerned that these are worsening.  The patient notes the kidney problem has been present for a long time and has been progressively getting worse.  The patient is followed by nephrology.    The patient is right-handed.  However, she has had a lymph node removals in her left upper arm  The patient has been considering the various methods of dialysis and wishes to proceed with hemodialysis and therefore creation of AV access.  No recent shortening of the patient's walking distance or new symptoms consistent with claudication.  No history of rest pain symptoms. No new ulcers or wounds of the lower extremities have occurred.  The patient denies amaurosis fugax or recent TIA symptoms. There are no recent neurological changes noted. There is no history of DVT, PE or superficial thrombophlebitis. No recent episodes of angina or shortness of breath documented.    Based on noninvasive studies the patient should have a right brachial ax graft placed.    Review of Systems  Cardiovascular:  Negative for leg swelling.  All other systems reviewed and are negative.      Objective:   Physical Exam Vitals reviewed.  HENT:     Head: Normocephalic.  Cardiovascular:     Rate and Rhythm: Normal rate.     Pulses: Normal pulses.          Radial pulses are 2+ on the right side and 2+ on the left side.   Pulmonary:     Effort: Pulmonary effort is normal.  Skin:    General: Skin is warm and dry.  Neurological:     Mental Status: She is alert and oriented to person, place, and time.  Psychiatric:        Mood and Affect: Mood normal.        Behavior: Behavior normal.        Thought Content: Thought content normal.        Judgment: Judgment normal.     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   a.) moderately differentiated [place] (G2, T1N0Mx); s/p lumpectomy + adjuvant XRT   Breast cancer, left ([place]) 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   Coronary artery calcification seen on CT scan    CVA (cerebral vascular accident) ([place])    a.) remotes infarct(s) noted on CT imaging from 08/17/2017 --> RIGHT parietotemporal infarct; lacunar infarct LEFT pons   Diabetes type 2, controlled ([place]) 2000s   Diabetic retinopathy with macular edema ([place]) 03/2014   a.) Avastin injections as [place]   DJD (degenerative joint disease)     ESRD (end stage renal disease) ([place])    GERD (gastroesophageal reflux disease)    Glaucoma 03/2014   History of colon cancer 2006   a.) s/p colectomy   History of hepatitis A 1990s   from food, resolved   HTN (hypertension)    Hyperplastic colon polyp    Long term current use of anticoagulant    a.) apixaban   Long term current use of antithrombotics/antiplatelets    a.) pentoxifylline   Macular degeneration    wet R with vision loss, dry L; to see retina specialist   Neuropathy    OA (osteoarthritis)    Osteonecrosis (HCC)    hips?   PONV (postoperative nausea and vomiting)    Post-surgical hypothyroidism    Thrombocytopenia (HCC)    Tubular adenoma    Wears dentures    partial upper    Social History   Socioeconomic History   Marital status: Divorced    Spouse name: Not on file   Number of children: Not on file   Years of education: Not on file   Highest education level: Not on file  Occupational History   Not on file  Tobacco Use   Smoking status: Former   Smokeless tobacco: Never   Tobacco comments:    quit over 40 yrs ago  Vaping Use   Vaping Use: Never used  Substance and Sexual Activity   Alcohol use: Never    Comment: rare   Drug use: Never   Sexual activity: Not Currently  Other Topics Concern   Not on file  Social History Narrative   Lives alone, 1 dog.   Granddaughter in [place] (attorney).   Divorced   Occu: retired, worked in Engineer, technical sales   Edu: college   Social Determinants of Radio broadcast assistant Strain: [place]  (12/28/2021)   Overall Financial Resource Strain (CARDIA)    Difficulty of Paying Living Expenses: Not hard at all  Food Insecurity: No Food Insecurity (12/28/2021)   Hunger Vital Sign    Worried About Running Out of Food in the Last Year: Never true    [place] in the Last Year: Never true  Transportation Needs: No Transportation Needs (12/28/2021)   PRAPARE - Hydrologist (Medical): No    Lack of  Transportation (Non-Medical): No  Physical Activity: Inactive (12/28/2021)   Exercise Vital Sign    Days of Exercise per Week: 0 days    Minutes of Exercise per Session: 0 min  Stress: No Stress Concern Present (12/28/2021)   [place]    Feeling of Stress : Not at all  Social Connections: Not on file  Intimate  Partner Violence: Not on file    Past Surgical History:  Procedure Laterality Date   ABDOMINAL HYSTERECTOMY  1976   partial, after tubal pregnancy, h/o fibroids with heavy bleeding   BREAST BIOPSY Left 2008   positive   BREAST BIOPSY Left 11/22/2017   2oclock 2cmfn wing shaped clip, INVASIVE MAMMARY CARCINOMA   BREAST BIOPSY Left 11/22/2017   2oclock 4cmfn coil shaped clip, INVASIVE MAMMARY CARCINOMA   BREAST BIOPSY Left 11/22/2017   lymph node - butterfly hydroMARK   BREAST EXCISIONAL BIOPSY Left 01/07/2018   lumpectomy by Dr. Bary Castilla   BREAST LUMPECTOMY Left 2008   F/U radiation   BREAST LUMPECTOMY Left 2019   2 areas of [facility] at 2:00 position f/u with mammosite   BREAST LUMPECTOMY WITH SENTINEL LYMPH NODE BIOPSY Left 01/07/2018   Procedure: BREAST LUMPECTOMY WITH SENTINEL LYMPH NODE BX;  Surgeon: Robert Bellow, MD;  Location: [facility] ORS;  Service: General;  Laterality: Left;   BREAST MAMMOSITE  2008   carotid ultrasound  12/2012   1-39% bilateral stenosis, severe in ECA   CATARACT EXTRACTION W/PHACO Left 08/14/2016   Procedure: CATARACT EXTRACTION PHACO AND INTRAOCULAR LENS PLACEMENT ([place])  Left Diabetic;  Surgeon: Ronnell Freshwater, MD;  Location: [place];  Service: Ophthalmology;  Laterality: Left;  Diabetic - oral meds   CHOLECYSTECTOMY  2005   COLECTOMY  2005   colon cancer   COLON SURGERY  2006   bowel obstruction   COLONOSCOPY  2008   ext hem, ileo-colonic anastomosis in cecum, tubular adenoma x1, rec rpt 1 yr for multiple polyps (in [place])   COLONOSCOPY WITH PROPOFOL N/A  10/19/2021   2.5cm TVA with low grade dysplasia, diverticulosis, no rpt recommended Vicente Males, Bailey Mech, MD)   ESOPHAGOGASTRODUODENOSCOPY N/A 05/06/2016   Procedure: ESOPHAGOGASTRODUODENOSCOPY (EGD);  Surgeon: Manya Silvas, MD;  Location[facility] ENDOSCOPY;  Service: Endoscopy;  Laterality: N/A;   EYE SURGERY     JOINT REPLACEMENT     THYROIDECTOMY, PARTIAL     unclear why   TOTAL KNEE ARTHROPLASTY Right 1999   US ECHOCARDIOGRAPHY  11/2007   nl LV fxn EF 17%, diastolic dysfunction, LVH, tr TR and MR, slcerotic aortic valve    Family History  Problem Relation Age of Onset   Diabetes Mother    Hypertension Mother    Arthritis Mother    Alcohol abuse Father    Arthritis Father    Stroke Daughter    CAD Neg Hx    Cancer Neg Hx    Breast cancer Neg Hx     Allergies  Allergen Reactions   Tramadol Other (See Comments)    nightmares   Codeine Anxiety   Sulfa Antibiotics Itching and Rash       Latest Ref Rng & Units 11/25/2021   12:17 PM 11/17/2021    2:03 AM 11/16/2021    5:04 AM  CBC  WBC 4.0 - 10.5 K/uL 5.2  6.7  5.8   Hemoglobin 12.0 - 15.0 g/dL 9.3  9.5  9.6   Hematocrit 36.0 - 46.0 % 28.4  30.4  31.9   Platelets 150.0 - 400.0 K/uL 111.0  106  116       CMP     Component Value Date/Time   NA 140 11/25/2021 1217   NA 140 10/16/2012 1227   K 4.7 11/25/2021 1217   K 4.3 02/26/2014 0000   CL 111 11/25/2021 1217   CL 106 10/16/2012 1227   CO2 22 11/25/2021 1217  CO2 24 10/16/2012 1227   GLUCOSE 117 (H) 11/25/2021 1217   GLUCOSE 262 (H) 10/16/2012 1227   BUN 42 (H) 11/25/2021 1217   BUN 22 (H) 10/16/2012 1227   CREATININE 5.16 (HH) 11/25/2021 1217   CREATININE 4.05 02/26/2014 0000   CALCIUM 9.6 11/25/2021 1217   CALCIUM 9.0 10/16/2012 1227   PROT 7.5 11/15/2021 1722   PROT 7.9 10/16/2012 1227   ALBUMIN 3.8 11/25/2021 1217   ALBUMIN 3.9 10/16/2012 1227   AST 15 11/15/2021 1722   AST 19 10/16/2012 1227   AST 14 01/11/2012 0000   ALT 10 11/15/2021 1722   ALT 21  10/16/2012 1227   ALKPHOS 70 11/15/2021 1722   ALKPHOS 106 10/16/2012 1227   ALKPHOS 91 01/11/2012 0000   BILITOT 0.5 11/15/2021 1722   BILITOT 0.4 10/16/2012 1227   BILITOT 0.4 01/11/2012 0000   GFRNONAA 9 (L) 11/16/2021 0504   GFRAA 10 (L) 01/07/2018 0859     No results found.     Assessment & Plan:   1. CKD stage 5 due to type 2 diabetes mellitus ([place]) Recommend:  At this time the patient does not have appropriate extremity access for dialysis  Patient should have a right brachial axillary AV graft created.  The risks, benefits and alternative therapies were reviewed in detail with the patient.  All questions were answered.  The patient agrees to proceed with surgery.   The patient will follow up with me in the office after the surgery.   2. Primary hypertension Continue antihypertensive medications as already ordered, these medications have been reviewed and there are no changes at this time.   3. Type 2 diabetes mellitus with other specified complication, without long-term current use of insulin (HCC) Continue hypoglycemic medications as already ordered, these medications have been reviewed and there are no changes at this time.  Hgb A1C to be monitored as already arranged by primary service    Current Outpatient Medications on File Prior to Visit  Medication Sig Dispense Refill   allopurinol (ZYLOPRIM) 100 MG tablet Take 1 tablet (100 mg total) by mouth daily. 90 tablet 3   amLODipine (NORVASC) 5 MG tablet Take 1 tablet (5 mg total) by mouth daily. 30 tablet 3   apixaban (ELIQUIS) 5 MG TABS tablet Take 1 tablet (5 mg total) by mouth 2 (two) times daily. 60 tablet 6   Blood Glucose Monitoring Suppl ([place] BLOOD GLUCOSE METER) W/DEVICE KIT Use as directed to check sugars daily 250.42 1 kit 0   CALCIUM-MAGNESIUM PO Take 1 tablet by mouth daily.     carvedilol (COREG) 25 MG tablet Take 12.5 mg by mouth 2 (two) times daily with a meal.     Cholecalciferol (VITAMIN D-3)  5000 UNITS TABS Take 1 tablet by mouth daily.     isosorbide dinitrate (ISORDIL) 20 MG tablet Take 1 tablet (20 mg total) by mouth daily. 90 tablet 3   levothyroxine (SYNTHROID) 88 MCG tablet TAKE 1 TABLET BY MOUTH EVERY DAY 90 tablet 3   pantoprazole (PROTONIX) 40 MG tablet Take 1 tablet (40 mg total) by mouth daily as needed. 90 tablet 1   pentoxifylline (TRENTAL) 400 MG CR tablet Take 1 tablet (400 mg total) by mouth daily. 90 tablet 3   saxagliptin HCl (ONGLYZA) 2.5 MG TABS tablet Take 1 tablet (2.5 mg total) by mouth daily. 30 tablet 11   No current facility-administered medications on file prior to visit.    There are no Patient Instructions on file  for this visit. No follow-ups on file.   Kris Hartmann, NP

## 2022-01-31 NOTE — Progress Notes (Signed)
Subjective:    Patient ID: Alexis Tucker, female    DOB: 08-Feb-1941, 81 y.o.   MRN: [id number] Chief Complaint  Patient presents with   Establish Care    Referred by Dr Candiss Norse    The patient is seen for evaluation for dialysis access. 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 The patient's most recent creatinine clearance is less than 20. The patient volume status has not yet become an issue. Patient's blood pressures been relatively well controlled. There are mild uremic symptoms which appear to be relatively well tolerated at this time, however Dr. Candiss Norse is concerned that these are worsening.  The patient notes the kidney problem has been present for a long time and has been progressively getting worse.  The patient is followed by nephrology.    The patient is right-handed.  However, she has had a lymph node removals in her left upper arm  The patient has been considering the various methods of dialysis and wishes to proceed with hemodialysis and therefore creation of AV access.  No recent shortening of the patient's walking distance or new symptoms consistent with claudication.  No history of rest pain symptoms. No new ulcers or wounds of the lower extremities have occurred.  The patient denies amaurosis fugax or recent TIA symptoms. There are no recent neurological changes noted. There is no history of DVT, PE or superficial thrombophlebitis. No recent episodes of angina or shortness of breath documented.    Based on noninvasive studies the patient should have a right brachial ax graft placed.    Review of Systems  Cardiovascular:  Negative for leg swelling.  All other systems reviewed and are negative.      Objective:   Physical Exam Vitals reviewed.  HENT:     Head: Normocephalic.  Cardiovascular:     Rate and Rhythm: Normal rate.     Pulses: Normal pulses.          Radial pulses are 2+ on the right side and 2+ on the left side.   Pulmonary:     Effort: Pulmonary effort is normal.  Skin:    General: Skin is warm and dry.  Neurological:     Mental Status: She is alert and oriented to person, place, and time.  Psychiatric:        Mood and Affect: Mood normal.        Behavior: Behavior normal.        Thought Content: Thought content normal.        Judgment: Judgment normal.     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   a.) moderately differentiated IMC (G2, T1N0Mx); s/p lumpectomy + adjuvant XRT   Breast cancer, left ([place]) 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   Coronary artery calcification seen on CT scan    CVA (cerebral vascular accident) ([place])    a.) remotes infarct(s) noted on CT imaging from 08/17/2017 --> RIGHT parietotemporal infarct; lacunar infarct LEFT pons   Diabetes type 2, controlled ([place]) 2000s   Diabetic retinopathy with macular edema ([place]) 03/2014   a.) Avastin injections as [place]   DJD (degenerative joint disease)     ESRD (end stage renal disease) ([place])    GERD (gastroesophageal reflux disease)    Glaucoma 03/2014   History of colon cancer 2006   a.) s/p colectomy   History of hepatitis A 1990s   from food, resolved   HTN (hypertension)    Hyperplastic colon polyp    Long term current use of anticoagulant    a.) apixaban   Long term current use of antithrombotics/antiplatelets    a.) pentoxifylline   Macular degeneration    wet R with vision loss, dry L; to see retina specialist   Neuropathy    OA (osteoarthritis)    Osteonecrosis (HCC)    hips?   PONV (postoperative nausea and vomiting)    Post-surgical hypothyroidism    Thrombocytopenia (HCC)    Tubular adenoma    Wears dentures    partial upper    Social History   Socioeconomic History   Marital status: Divorced    Spouse name: Not on file   Number of children: Not on file   Years of education: Not on file   Highest education level: Not on file  Occupational History   Not on file  Tobacco Use   Smoking status: Former   Smokeless tobacco: Never   Tobacco comments:    quit over 40 yrs ago  Vaping Use   Vaping Use: Never used  Substance and Sexual Activity   Alcohol use: Never    Comment: rare   Drug use: Never   Sexual activity: Not Currently  Other Topics Concern   Not on file  Social History Narrative   Lives alone, 1 dog.   Granddaughter in [place] (attorney).   Divorced   Occu: retired, worked in Engineer, technical sales   Edu: college   Social Determinants of Radio broadcast assistant Strain: [place]  (12/28/2021)   Overall Financial Resource Strain (CARDIA)    Difficulty of Paying Living Expenses: Not hard at all  Food Insecurity: No Food Insecurity (12/28/2021)   Hunger Vital Sign    Worried About Running Out of Food in the Last Year: Never true    [place] in the Last Year: Never true  Transportation Needs: No Transportation Needs (12/28/2021)   PRAPARE - Hydrologist (Medical): No    Lack of  Transportation (Non-Medical): No  Physical Activity: Inactive (12/28/2021)   Exercise Vital Sign    Days of Exercise per Week: 0 days    Minutes of Exercise per Session: 0 min  Stress: No Stress Concern Present (12/28/2021)   [place]    Feeling of Stress : Not at all  Social Connections: Not on file  Intimate  Partner Violence: Not on file    Past Surgical History:  Procedure Laterality Date   ABDOMINAL HYSTERECTOMY  1976   partial, after tubal pregnancy, h/o fibroids with heavy bleeding   BREAST BIOPSY Left 2008   positive   BREAST BIOPSY Left 11/22/2017   2oclock 2cmfn wing shaped clip, INVASIVE MAMMARY CARCINOMA   BREAST BIOPSY Left 11/22/2017   2oclock 4cmfn coil shaped clip, INVASIVE MAMMARY CARCINOMA   BREAST BIOPSY Left 11/22/2017   lymph node - butterfly hydroMARK   BREAST EXCISIONAL BIOPSY Left 01/07/2018   lumpectomy by Dr. Bary Castilla   BREAST LUMPECTOMY Left 2008   F/U radiation   BREAST LUMPECTOMY Left 2019   2 areas of [facility] at 2:00 position f/u with mammosite   BREAST LUMPECTOMY WITH SENTINEL LYMPH NODE BIOPSY Left 01/07/2018   Procedure: BREAST LUMPECTOMY WITH SENTINEL LYMPH NODE BX;  Surgeon: Robert Bellow, MD;  Location: [facility] ORS;  Service: General;  Laterality: Left;   BREAST MAMMOSITE  2008   carotid ultrasound  12/2012   1-39% bilateral stenosis, severe in ECA   CATARACT EXTRACTION W/PHACO Left 08/14/2016   Procedure: CATARACT EXTRACTION PHACO AND INTRAOCULAR LENS PLACEMENT ([place])  Left Diabetic;  Surgeon: Ronnell Freshwater, MD;  Location: [place];  Service: Ophthalmology;  Laterality: Left;  Diabetic - oral meds   CHOLECYSTECTOMY  2005   COLECTOMY  2005   colon cancer   COLON SURGERY  2006   bowel obstruction   COLONOSCOPY  2008   ext hem, ileo-colonic anastomosis in cecum, tubular adenoma x1, rec rpt 1 yr for multiple polyps (in [place])   COLONOSCOPY WITH PROPOFOL N/A  10/19/2021   2.5cm TVA with low grade dysplasia, diverticulosis, no rpt recommended Vicente Males, Bailey Mech, MD)   ESOPHAGOGASTRODUODENOSCOPY N/A 05/06/2016   Procedure: ESOPHAGOGASTRODUODENOSCOPY (EGD);  Surgeon: Manya Silvas, MD;  [facility]: [facility] END[facility]COPY;  Service: Endoscopy;  Laterality: N/A;   EYE SURGERY     JOINT REPLACEMENT     THYROIDECTOMY, PARTIAL     unclear why   TOTAL KNEE ARTHROPLASTY Right 1999   US ECHOCARDIOGRAPHY  11/2007   nl LV fxn EF 17%, diastolic dysfunction, LVH, tr TR and MR, slcerotic aortic valve    Family History  Problem Relation Age of Onset   Diabetes Mother    Hypertension Mother    Arthritis Mother    Alcohol abuse Father    Arthritis Father    Stroke Daughter    CAD Neg Hx    Cancer Neg Hx    Breast cancer Neg Hx     Allergies  Allergen Reactions   Tramadol Other (See Comments)    nightmares   Codeine Anxiety   Sulfa Antibiotics Itching and Rash       Latest Ref Rng & Units 11/25/2021   12:17 PM 11/17/2021    2:03 AM 11/16/2021    5:04 AM  CBC  WBC 4.0 - 10.5 K/uL 5.2  6.7  5.8   Hemoglobin 12.0 - 15.0 g/dL 9.3  9.5  9.6   Hematocrit 36.0 - 46.0 % 28.4  30.4  31.9   Platelets 150.0 - 400.0 K/uL 111.0  106  116       CMP     Component Value Date/Time   NA 140 11/25/2021 1217   NA 140 10/16/2012 1227   K 4.7 11/25/2021 1217   K 4.3 02/26/2014 0000   CL 111 11/25/2021 1217   CL 106 10/16/2012 1227   CO2 22 11/25/2021 1217  CO2 24 10/16/2012 1227   GLUCOSE 117 (H) 11/25/2021 1217   GLUCOSE 262 (H) 10/16/2012 1227   BUN 42 (H) 11/25/2021 1217   BUN 22 (H) 10/16/2012 1227   CREATININE 5.16 (HH) 11/25/2021 1217   CREATININE 4.05 02/26/2014 0000   CALCIUM 9.6 11/25/2021 1217   CALCIUM 9.0 10/16/2012 1227   PROT 7.5 11/15/2021 1722   PROT 7.9 10/16/2012 1227   ALBUMIN 3.8 11/25/2021 1217   ALBUMIN 3.9 10/16/2012 1227   AST 15 11/15/2021 1722   AST 19 10/16/2012 1227   AST 14 01/11/2012 0000   ALT 10 11/15/2021 1722   ALT 21  10/16/2012 1227   ALKPHOS 70 11/15/2021 1722   ALKPHOS 106 10/16/2012 1227   ALKPHOS 91 01/11/2012 0000   BILITOT 0.5 11/15/2021 1722   BILITOT 0.4 10/16/2012 1227   BILITOT 0.4 01/11/2012 0000   GFRNONAA 9 (L) 11/16/2021 0504   GFRAA 10 (L) 01/07/2018 0859     No results found.     Assessment & Plan:   1. 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) Recommend:  At this time the patient does not have appropriate extremity access for dialysis  Patient should have a right brachial axillary AV graft created.  The risks, benefits and alternative therapies were reviewed in detail with the patient.  All questions were answered.  The patient agrees to proceed with surgery.   The patient will follow up with me in the office after the surgery.   2. Primary hypertension Continue antihypertensive medications as already ordered, these medications have been reviewed and there are no changes at this time.   3. Type 2 diabetes mellitus with other specified complication, without long-term current use of insulin (HCC) Continue hypoglycemic medications as already ordered, these medications have been reviewed and there are no changes at this time.  Hgb A1C to be monitored as already arranged by primary service    Current Outpatient Medications on File Prior to Visit  Medication Sig Dispense Refill   allopurinol (ZYLOPRIM) 100 MG tablet Take 1 tablet (100 mg total) by mouth daily. 90 tablet 3   amLODipine (NORVASC) 5 MG tablet Take 1 tablet (5 mg total) by mouth daily. 30 tablet 3   apixaban (ELIQUIS) 5 MG TABS tablet Take 1 tablet (5 mg total) by mouth 2 (two) times daily. 60 tablet 6   Blood Glucose Monitoring Suppl (ACURA BLOOD GLUCOSE METER) W/DEVICE KIT Use as directed to check sugars daily 250.42 1 kit 0   CALCIUM-MAGNESIUM PO Take 1 tablet by mouth daily.     carvedilol (COREG) 25 MG tablet Take 12.5 mg by mouth 2 (two) times daily with a meal.     Cholecalciferol (VITAMIN D-3)  5000 UNITS TABS Take 1 tablet by mouth daily.     isosorbide dinitrate (ISORDIL) 20 MG tablet Take 1 tablet (20 mg total) by mouth daily. 90 tablet 3   levothyroxine (SYNTHROID) 88 MCG tablet TAKE 1 TABLET BY MOUTH EVERY DAY 90 tablet 3   pantoprazole (PROTONIX) 40 MG tablet Take 1 tablet (40 mg total) by mouth daily as needed. 90 tablet 1   pentoxifylline (TRENTAL) 400 MG CR tablet Take 1 tablet (400 mg total) by mouth daily. 90 tablet 3   saxagliptin HCl (ONGLYZA) 2.5 MG TABS tablet Take 1 tablet (2.5 mg total) by mouth daily. 30 tablet 11   No current facility-administered medications on file prior to visit.    There are no Patient Instructions on file   for this visit. No follow-ups on file.   Kris Hartmann, NP

## 2022-02-01 ENCOUNTER — Other Ambulatory Visit: Payer: Self-pay

## 2022-02-01 ENCOUNTER — Encounter: Payer: Self-pay | Admitting: Vascular Surgery

## 2022-02-01 ENCOUNTER — Encounter
Admission: RE | Disposition: A | Discharge status: Home / Self Care | Payer: Self-pay | Source: Home / Self Care | Attending: Vascular Surgery

## 2022-02-01 ENCOUNTER — Ambulatory Visit
Admission: RE | Admit: 2022-02-01 | Discharge: 2022-02-01 | Disposition: A | Discharge status: Home / Self Care | Payer: Medicare Other | Attending: Vascular Surgery | Admitting: Vascular Surgery

## 2022-02-01 ENCOUNTER — Ambulatory Visit: Payer: Medicare Other | Admitting: Urgent Care

## 2022-02-01 DIAGNOSIS — Z853 Personal history of malignant neoplasm of breast: Secondary | ICD-10-CM | POA: Insufficient documentation

## 2022-02-01 DIAGNOSIS — Z96651 Presence of right artificial knee joint: Secondary | ICD-10-CM | POA: Diagnosis not present

## 2022-02-01 DIAGNOSIS — Z7984 Long term (current) use of oral hypoglycemic drugs: Secondary | ICD-10-CM | POA: Diagnosis not present

## 2022-02-01 DIAGNOSIS — E1140 Type 2 diabetes mellitus with diabetic neuropathy, unspecified: Secondary | ICD-10-CM | POA: Diagnosis not present

## 2022-02-01 DIAGNOSIS — E1169 Type 2 diabetes mellitus with other specified complication: Secondary | ICD-10-CM

## 2022-02-01 DIAGNOSIS — I5032 Chronic diastolic (congestive) heart failure: Secondary | ICD-10-CM | POA: Diagnosis not present

## 2022-02-01 DIAGNOSIS — I4891 Unspecified atrial fibrillation: Secondary | ICD-10-CM | POA: Insufficient documentation

## 2022-02-01 DIAGNOSIS — Z85038 Personal history of other malignant neoplasm of large intestine: Secondary | ICD-10-CM | POA: Insufficient documentation

## 2022-02-01 DIAGNOSIS — I132 Hypertensive heart and chronic kidney disease with heart failure and with stage 5 chronic kidney disease, or end stage renal disease: Secondary | ICD-10-CM | POA: Insufficient documentation

## 2022-02-01 DIAGNOSIS — N186 End stage renal disease: Secondary | ICD-10-CM | POA: Diagnosis not present

## 2022-02-01 DIAGNOSIS — Z992 Dependence on renal dialysis: Secondary | ICD-10-CM | POA: Diagnosis not present

## 2022-02-01 DIAGNOSIS — Z87891 Personal history of nicotine dependence: Secondary | ICD-10-CM | POA: Diagnosis not present

## 2022-02-01 DIAGNOSIS — Z8673 Personal history of transient ischemic attack (TIA), and cerebral infarction without residual deficits: Secondary | ICD-10-CM | POA: Diagnosis not present

## 2022-02-01 DIAGNOSIS — D631 Anemia in chronic kidney disease: Secondary | ICD-10-CM | POA: Diagnosis not present

## 2022-02-01 DIAGNOSIS — E11319 Type 2 diabetes mellitus with unspecified diabetic retinopathy without macular edema: Secondary | ICD-10-CM | POA: Insufficient documentation

## 2022-02-01 DIAGNOSIS — K219 Gastro-esophageal reflux disease without esophagitis: Secondary | ICD-10-CM | POA: Diagnosis not present

## 2022-02-01 DIAGNOSIS — I2510 Atherosclerotic heart disease of native coronary artery without angina pectoris: Secondary | ICD-10-CM | POA: Insufficient documentation

## 2022-02-01 DIAGNOSIS — E890 Postprocedural hypothyroidism: Secondary | ICD-10-CM | POA: Insufficient documentation

## 2022-02-01 DIAGNOSIS — Z923 Personal history of irradiation: Secondary | ICD-10-CM | POA: Insufficient documentation

## 2022-02-01 DIAGNOSIS — I509 Heart failure, unspecified: Secondary | ICD-10-CM | POA: Diagnosis not present

## 2022-02-01 DIAGNOSIS — I6529 Occlusion and stenosis of unspecified carotid artery: Secondary | ICD-10-CM | POA: Diagnosis not present

## 2022-02-01 DIAGNOSIS — E1122 Type 2 diabetes mellitus with diabetic chronic kidney disease: Secondary | ICD-10-CM | POA: Diagnosis not present

## 2022-02-01 HISTORY — DX: Thrombocytopenia, unspecified: D69.6

## 2022-02-01 HISTORY — DX: End stage renal disease: N18.6

## 2022-02-01 HISTORY — DX: Benign neoplasm, unspecified site: D36.9

## 2022-02-01 HISTORY — PX: AV FISTULA PLACEMENT: SHX1204

## 2022-02-01 HISTORY — DX: Long term (current) use of antithrombotics/antiplatelets: Z79.02

## 2022-02-01 HISTORY — DX: Long term (current) use of anticoagulants: Z79.01

## 2022-02-01 HISTORY — DX: Chronic diastolic (congestive) heart failure: I50.32

## 2022-02-01 HISTORY — DX: Atherosclerotic heart disease of native coronary artery without angina pectoris: I25.10

## 2022-02-01 HISTORY — DX: Cerebral infarction, unspecified: I63.9

## 2022-02-01 HISTORY — DX: Polyp of colon: K63.5

## 2022-02-01 LAB — TYPE AND SCREEN
ABO/RH(D): O POS
Antibody Screen: NEGATIVE

## 2022-02-01 LAB — POCT I-STAT, CHEM 8
BUN: 45 mg/dL — ABNORMAL HIGH (ref 8–23)
Calcium, Ion: 1.27 mmol/L (ref 1.15–1.40)
Chloride: 114 mmol/L — ABNORMAL HIGH (ref 98–111)
Creatinine, Ser: 5.7 mg/dL — ABNORMAL HIGH (ref 0.44–1.00)
Glucose, Bld: 118 mg/dL — ABNORMAL HIGH (ref 70–99)
HCT: 28 % — ABNORMAL LOW (ref 36.0–46.0)
Hemoglobin: 9.5 g/dL — ABNORMAL LOW (ref 12.0–15.0)
Potassium: 5.1 mmol/L (ref 3.5–5.1)
Sodium: 142 mmol/L (ref 135–145)
TCO2: 18 mmol/L — ABNORMAL LOW (ref 22–32)

## 2022-02-01 LAB — GLUCOSE, CAPILLARY
Glucose-Capillary: 116 mg/dL — ABNORMAL HIGH (ref 70–99)
Glucose-Capillary: 126 mg/dL — ABNORMAL HIGH (ref 70–99)

## 2022-02-01 LAB — ABO/RH: ABO/RH(D): O POS

## 2022-02-01 SURGERY — INSERTION OF ARTERIOVENOUS (AV) GORE-TEX GRAFT ARM
Anesthesia: General | Laterality: Right

## 2022-02-01 MED ORDER — SUGAMMADEX SODIUM 200 MG/2ML IV SOLN
INTRAVENOUS | Status: DC | PRN
  Administered 2022-02-01: 200 mg via INTRAVENOUS

## 2022-02-01 MED ORDER — ONDANSETRON HCL 4 MG/2ML IJ SOLN: 4.0000 mg | Freq: Four times a day (QID) | INTRAMUSCULAR | Status: DC | PRN

## 2022-02-01 MED ORDER — PHENYLEPHRINE HCL-NACL 20-0.9 MG/250ML-% IV SOLN
INTRAVENOUS | Status: DC | PRN
  Administered 2022-02-01: 25 ug/min via INTRAVENOUS

## 2022-02-01 MED ORDER — ACETAMINOPHEN 10 MG/ML IV SOLN
INTRAVENOUS | Status: DC | PRN
  Administered 2022-02-01: 1000 mg via INTRAVENOUS

## 2022-02-01 MED ORDER — PHENYLEPHRINE HCL-NACL 20-0.9 MG/250ML-% IV SOLN
INTRAVENOUS | Status: AC
  Filled 2022-02-01: qty 250

## 2022-02-01 MED ORDER — HEPARIN 5000 UNITS IN NS 1000 ML (FLUSH)
INTRAMUSCULAR | Status: DC | PRN
  Administered 2022-02-01: 500 mL via INTRAMUSCULAR

## 2022-02-01 MED ORDER — HEPARIN SODIUM (PORCINE) 5000 UNIT/ML IJ SOLN
INTRAMUSCULAR | Status: AC
  Filled 2022-02-01: qty 1

## 2022-02-01 MED ORDER — PROPOFOL 10 MG/ML IV BOLUS
INTRAVENOUS | Status: DC | PRN
  Administered 2022-02-01: 100 mg via INTRAVENOUS

## 2022-02-01 MED ORDER — FENTANYL CITRATE (PF) 100 MCG/2ML IJ SOLN
INTRAMUSCULAR | Status: AC
  Filled 2022-02-01: qty 2

## 2022-02-01 MED ORDER — ACETAMINOPHEN 10 MG/ML IV SOLN
INTRAVENOUS | Status: AC
  Filled 2022-02-01: qty 100

## 2022-02-01 MED ORDER — OXYCODONE HCL 5 MG/5ML PO SOLN: 5.0000 mg | Freq: Once | ORAL | Status: DC | PRN

## 2022-02-01 MED ORDER — DEXAMETHASONE SODIUM PHOSPHATE 10 MG/ML IJ SOLN
INTRAMUSCULAR | Status: DC | PRN
  Administered 2022-02-01: 10 mg via INTRAVENOUS

## 2022-02-01 MED ORDER — FENTANYL CITRATE (PF) 100 MCG/2ML IJ SOLN
INTRAMUSCULAR | Status: DC | PRN
Start: 2022-02-01 — End: 2022-02-01
  Administered 2022-02-01: 50 ug via INTRAVENOUS
  Administered 2022-02-01: 25 ug via INTRAVENOUS

## 2022-02-01 MED ORDER — CHLORHEXIDINE GLUCONATE 0.12 % MT SOLN
OROMUCOSAL | Status: AC
  Filled 2022-02-01: qty 15

## 2022-02-01 MED ORDER — HEPARIN SODIUM (PORCINE) 1000 UNIT/ML IJ SOLN
INTRAMUSCULAR | Status: DC | PRN
  Administered 2022-02-01: 3000 [IU] via INTRAVENOUS

## 2022-02-01 MED ORDER — PROPOFOL 10 MG/ML IV BOLUS
INTRAVENOUS | Status: AC
  Filled 2022-02-01: qty 20

## 2022-02-01 MED ORDER — GLYCOPYRROLATE 0.2 MG/ML IJ SOLN
INTRAMUSCULAR | Status: DC | PRN
  Administered 2022-02-01: .2 mg via INTRAVENOUS

## 2022-02-01 MED ORDER — OXYCODONE HCL 5 MG PO TABS: 5.0000 mg | ORAL_TABLET | Freq: Once | ORAL | Status: DC | PRN

## 2022-02-01 MED ORDER — FENTANYL CITRATE (PF) 100 MCG/2ML IJ SOLN: 25.0000 ug | INTRAMUSCULAR | Status: DC | PRN

## 2022-02-01 MED ORDER — PHENYLEPHRINE 80 MCG/ML (10ML) SYRINGE FOR IV PUSH (FOR BLOOD PRESSURE SUPPORT)
PREFILLED_SYRINGE | INTRAVENOUS | Status: DC | PRN
  Administered 2022-02-01: 160 ug via INTRAVENOUS

## 2022-02-01 MED ORDER — CEFAZOLIN SODIUM-DEXTROSE 2-4 GM/100ML-% IV SOLN
INTRAVENOUS | Status: AC
  Filled 2022-02-01: qty 100

## 2022-02-01 MED ORDER — SUCCINYLCHOLINE CHLORIDE 200 MG/10ML IV SOSY
PREFILLED_SYRINGE | INTRAVENOUS | Status: AC
  Filled 2022-02-01: qty 10

## 2022-02-01 MED ORDER — LIDOCAINE HCL (CARDIAC) PF 100 MG/5ML IV SOSY
PREFILLED_SYRINGE | INTRAVENOUS | Status: DC | PRN
  Administered 2022-02-01: 80 mg via INTRAVENOUS

## 2022-02-01 MED ORDER — LIDOCAINE HCL (PF) 2 % IJ SOLN
INTRAMUSCULAR | Status: AC
  Filled 2022-02-01: qty 5

## 2022-02-01 MED ORDER — HEMOSTATIC AGENTS (NO CHARGE) OPTIME
TOPICAL | Status: DC | PRN
  Administered 2022-02-01: 1 via TOPICAL

## 2022-02-01 MED ORDER — ROCURONIUM BROMIDE 10 MG/ML (PF) SYRINGE
PREFILLED_SYRINGE | INTRAVENOUS | Status: AC
  Filled 2022-02-01: qty 10

## 2022-02-01 MED ORDER — ROCURONIUM BROMIDE 100 MG/10ML IV SOLN
INTRAVENOUS | Status: DC | PRN
  Administered 2022-02-01: 10 mg via INTRAVENOUS
  Administered 2022-02-01: 40 mg via INTRAVENOUS

## 2022-02-01 MED ORDER — ONDANSETRON HCL 4 MG/2ML IJ SOLN
INTRAMUSCULAR | Status: DC | PRN
  Administered 2022-02-01: 4 mg via INTRAVENOUS

## 2022-02-01 MED ORDER — APREPITANT 40 MG PO CAPS
ORAL_CAPSULE | ORAL | Status: AC
  Filled 2022-02-01: qty 1

## 2022-02-01 MED ORDER — HYDROCODONE-ACETAMINOPHEN 5-325 MG PO TABS: 2.0000 | ORAL_TABLET | Freq: Four times a day (QID) | ORAL | 0 refills | Status: DC | PRN

## 2022-02-01 MED ORDER — HYDROMORPHONE HCL 1 MG/ML IJ SOLN: 1.0000 mg | Freq: Once | INTRAMUSCULAR | Status: DC | PRN

## 2022-02-01 MED ORDER — SODIUM CHLORIDE 0.9 % IV SOLN: INTRAVENOUS | Status: DC

## 2022-02-01 SURGICAL SUPPLY — 56 items
ADH SKN CLS APL DERMABOND .7 (GAUZE/BANDAGES/DRESSINGS) ×1
APL PRP STRL LF DISP 70% ISPRP (MISCELLANEOUS) ×1
BAG DECANTER FOR FLEXI CONT (MISCELLANEOUS) ×2 IMPLANT
BLADE SURG SZ11 CARB STEEL (BLADE) ×2 IMPLANT
BOOT SUTURE AID YELLOW STND (SUTURE) ×2 IMPLANT
BRUSH SCRUB EZ  4% CHG (MISCELLANEOUS) ×1
BRUSH SCRUB EZ 4% CHG (MISCELLANEOUS) ×1 IMPLANT
CHLORAPREP W/TINT 26 (MISCELLANEOUS) ×2 IMPLANT
CLIP SPRNG 6 S-JAW DBL (CLIP) ×1 IMPLANT
CLIP SPRNG 6MM S-JAW DBL (CLIP) ×2
DERMABOND ADVANCED (GAUZE/BANDAGES/DRESSINGS) ×1
DERMABOND ADVANCED .7 DNX12 (GAUZE/BANDAGES/DRESSINGS) IMPLANT
ELECT CAUTERY BLADE 6.4 (BLADE) ×2 IMPLANT
ELECT REM PT RETURN 9FT ADLT (ELECTROSURGICAL) ×2
ELECTRODE REM PT RTRN 9FT ADLT (ELECTROSURGICAL) ×1 IMPLANT
GLOVE BIO SURGEON STRL SZ7 (GLOVE) ×2 IMPLANT
GOWN STRL REUS W/ TWL LRG LVL3 (GOWN DISPOSABLE) ×1 IMPLANT
GOWN STRL REUS W/ TWL XL LVL3 (GOWN DISPOSABLE) ×1 IMPLANT
GOWN STRL REUS W/TWL LRG LVL3 (GOWN DISPOSABLE) ×2
GOWN STRL REUS W/TWL XL LVL3 (GOWN DISPOSABLE) ×2
GRAFT GORETEX STRETCH 6X40 (Vascular Products) ×1 IMPLANT
HEMOSTAT SURGICEL 2X3 (HEMOSTASIS) ×2 IMPLANT
IV NS 500ML (IV SOLUTION) ×2
IV NS 500ML BAXH (IV SOLUTION) ×1 IMPLANT
KIT TURNOVER KIT A (KITS) ×2 IMPLANT
LABEL OR SOLS (LABEL) ×2 IMPLANT
LOOP RED MAXI  1X406MM (MISCELLANEOUS) ×1
LOOP VESSEL MAXI 1X406 RED (MISCELLANEOUS) ×1 IMPLANT
LOOP VESSEL MINI 0.8X406 BLUE (MISCELLANEOUS) ×1 IMPLANT
LOOPS BLUE MINI 0.8X406MM (MISCELLANEOUS) ×2
MANIFOLD NEPTUNE II (INSTRUMENTS) ×2 IMPLANT
NDL FILTER BLUNT 18X1 1/2 (NEEDLE) ×1 IMPLANT
NEEDLE FILTER BLUNT 18X 1/2SAF (NEEDLE) ×1
NEEDLE FILTER BLUNT 18X1 1/2 (NEEDLE) ×1 IMPLANT
NS IRRIG 500ML POUR BTL (IV SOLUTION) ×2 IMPLANT
PACK EXTREMITY ARMC (MISCELLANEOUS) ×2 IMPLANT
PAD PREP 24X41 OB/GYN DISP (PERSONAL CARE ITEMS) ×2 IMPLANT
SOLUTION CELL SAVER (CLIP) ×1 IMPLANT
SPIKE FLUID TRANSFER (MISCELLANEOUS) ×2 IMPLANT
STOCKINETTE 48X4 2 PLY STRL (GAUZE/BANDAGES/DRESSINGS) ×1 IMPLANT
STOCKINETTE STRL 4IN 9604848 (GAUZE/BANDAGES/DRESSINGS) ×2 IMPLANT
SUT GORETEX 6.0 TT9 (SUTURE) ×3 IMPLANT
SUT MNCRL AB 4-0 PS2 18 (SUTURE) ×2 IMPLANT
SUT PROLENE 6 0 BV (SUTURE) ×8 IMPLANT
SUT SILK 0 SH 30 (SUTURE) ×2 IMPLANT
SUT SILK 2 0 (SUTURE) ×2
SUT SILK 2-0 18XBRD TIE 12 (SUTURE) ×1 IMPLANT
SUT SILK 3 0 (SUTURE) ×2
SUT SILK 3-0 18XBRD TIE 12 (SUTURE) ×1 IMPLANT
SUT SILK 4 0 (SUTURE) ×2
SUT SILK 4-0 18XBRD TIE 12 (SUTURE) ×1 IMPLANT
SUT VIC AB 3-0 SH 27 (SUTURE) ×2
SUT VIC AB 3-0 SH 27X BRD (SUTURE) ×1 IMPLANT
SYR 20ML LL LF (SYRINGE) ×2 IMPLANT
SYR 3ML LL SCALE MARK (SYRINGE) ×2 IMPLANT
WATER STERILE IRR 500ML POUR (IV SOLUTION) ×2 IMPLANT

## 2022-02-01 NOTE — Interval H&P Note (Signed)
History and Physical Interval Note:  02/01/2022 2:33 PM  Alexis Tucker  has presented today for surgery, with the diagnosis of ESRD.  The various methods of treatment have been discussed with the patient and family. After consideration of risks, benefits and other options for treatment, the patient has consented to  Procedure(s): INSERTION OF ARTERIOVENOUS (AV) GORE-TEX GRAFT ARM (LEFT UPPER EXTREMITY) (Right) as a surgical intervention.  The patient's history has been reviewed, patient examined, no change in status, stable for surgery.  I have reviewed the patient's chart and labs.  Questions were answered to the patient's satisfaction.     Leotis Pain

## 2022-02-01 NOTE — Anesthesia Procedure Notes (Signed)
Procedure Name: Intubation Date/Time: 02/01/2022 3:20 PM  Performed by: Kelton Pillar, CRNAPre-anesthesia Checklist: Patient identified, Emergency Drugs available, Suction available and Patient being monitored Patient Re-evaluated:Patient Re-evaluated prior to induction Oxygen Delivery Method: Circle system utilized Preoxygenation: Pre-oxygenation with 100% oxygen Induction Type: IV induction Ventilation: Mask ventilation without difficulty Laryngoscope Size: McGraph and 3 Tube type: Oral Tube size: 6.5 mm Number of attempts: 1 Airway Equipment and Method: Stylet and Oral airway Placement Confirmation: ETT inserted through vocal cords under direct vision, positive ETCO2, breath sounds checked- equal and bilateral and CO2 detector Secured at: 21 cm Tube secured with: Tape Dental Injury: Teeth and Oropharynx as per pre-operative assessment

## 2022-02-01 NOTE — Transfer of Care (Signed)
Immediate Anesthesia Transfer of Care Note  Patient: Alexis Tucker  Procedure(s) Performed: INSERTION OF ARTERIOVENOUS (AV) GORE-TEX GRAFT ARM (LEFT UPPER EXTREMITY) (Right)  Patient Location: PACU  Anesthesia Type:General  Level of Consciousness: drowsy  Airway & Oxygen Therapy: Patient Spontanous Breathing and Patient connected to face mask oxygen  Post-op Assessment: Report given to RN  Post vital signs: stable  Last Vitals:  Vitals Value Taken Time  BP    Temp    Pulse    Resp    SpO2      Last Pain:  Vitals:   02/01/22 1147  TempSrc: Temporal  PainSc: 0-No pain         Complications: No notable events documented.

## 2022-02-01 NOTE — Anesthesia Preprocedure Evaluation (Signed)
Anesthesia Evaluation  Patient identified by MRN, date of birth, ID band Patient awake    Reviewed: Allergy & Precautions, NPO status , Patient's Chart, lab work & pertinent test results  History of Anesthesia Complications (+) PONV and history of anesthetic complications  Airway Mallampati: III  TM Distance: >3 FB Neck ROM: full    Dental  (+) Lower Dentures, Upper Dentures, Missing   Pulmonary neg shortness of breath, former smoker,    Pulmonary exam normal        Cardiovascular Exercise Tolerance: Good hypertension, (-) angina+ CAD and +CHF  Normal cardiovascular exam     Neuro/Psych negative neurological ROS  negative psych ROS   GI/Hepatic Neg liver ROS, GERD  Controlled,  Endo/Other  diabetes, Type 2Hypothyroidism   Renal/GU ESRF and DialysisRenal disease  negative genitourinary   Musculoskeletal   Abdominal   Peds  Hematology negative hematology ROS (+)   Anesthesia Other Findings Past Medical History: No date: Anemia     Comment:  per prior pcp 2008: Breast cancer (HCC)     Comment:  F/U radiation  11/27/2017: Breast cancer (HCC)     Comment:  mpT2 pN0 ER/PR positive, HER-2/neu negative.  2.5 cm               maximum diameter, multifocal.  MammoSite 01/2014: Carotid stenosis, asymptomatic     Comment:  mild ICA, severe in ECA, rec rpt [place] 2 years No date: Cataracts, bilateral 01/2012: CKD (chronic kidney disease) stage 4, GFR 15-29 ml/min (HCC)     Comment:  Cr 3.85, in 3s since 2010, discussing HD and L forearm               graft ([place]) 2000s: Diabetes type 2, controlled (HCC) 03/2014: Diabetic retinopathy with macular edema (HCC)     Comment:  [place] at [employer] eye on avastin No date: DJD (degenerative joint disease)     Comment:  per prior pcp No date: GERD (gastroesophageal reflux disease) 03/2014: Glaucoma 2008: History of breast cancer     Comment:  infiltrating ductal carcinoma  s/p mammosite 2006: History of colon cancer     Comment:  s/p colectomy 1990s: History of hepatitis A     Comment:  from food, resolved No date: HTN (hypertension) No date: Macular degeneration     Comment:  wet R with vision loss, dry L; to see retina specialist No date: Neuropathy     Comment:  per prior pcp No date: OA (osteoarthritis)     Comment:  knees and hips No date: Osteonecrosis (HCC)     Comment:  hips? 2008: Personal history of radiation therapy     Comment:  F/U Left Breast Cancer 2019: Personal history of radiation therapy     Comment:  mammosite left breast ca No date: PONV (postoperative nausea and vomiting) No date: Post-surgical hypothyroidism No date: Wears dentures     Comment:  partial upper  Past Surgical History: 1976: ABDOMINAL HYSTERECTOMY     Comment:  partial, after tubal pregnancy, h/o fibroids with heavy               bleeding 2008: BREAST BIOPSY; Left     Comment:  positive 11/22/2017: BREAST BIOPSY; Left     Comment:  2oclock 2cmfn wing shaped clip, INVASIVE MAMMARY               CARCINOMA 11/22/2017: BREAST BIOPSY; Left     Comment:  2oclock 4cmfn coil shaped clip, INVASIVE MAMMARY  CARCINOMA 11/22/2017: BREAST BIOPSY; Left     Comment:  lymph node - butterfly hydroMARK 01/07/2018: BREAST EXCISIONAL BIOPSY; Left     Comment:  lumpectomy by Dr. Bary Castilla 2008: BREAST LUMPECTOMY; Left     Comment:  F/U radiation 2019: BREAST LUMPECTOMY; Left     Comment:  2 areas of [place] at 2:00 position f/u with mammosite 01/07/2018: BREAST LUMPECTOMY WITH SENTINEL LYMPH NODE BIOPSY; Left     Comment:  Procedure: BREAST LUMPECTOMY WITH SENTINEL LYMPH NODE               BX;  Surgeon: Robert Bellow, MD;  Location: [facility]               ORS;  Service: General;  Laterality: Left; 2008: BREAST MAMMOSITE 12/2012: carotid ultrasound     Comment:  1-39% bilateral stenosis, severe in ECA 08/14/2016: CATARACT EXTRACTION W/PHACO; Left     Comment:   Procedure: CATARACT EXTRACTION PHACO AND INTRAOCULAR               LENS PLACEMENT (IOC)  Left Diabetic;  Surgeon: Ronnell Freshwater, MD;  Location: [place];                Service: Ophthalmology;  Laterality: Left;  Diabetic -               oral meds 2005: CHOLECYSTECTOMY 2005: COLECTOMY     Comment:  colon cancer 2006: COLON SURGERY     Comment:  bowel obstruction 2008: COLONOSCOPY     Comment:  ext hem, ileo-colonic anastomosis in cecum, tubular               adenoma x1, rec rpt 1 yr for multiple polyps (in [place]) 05/06/2016: ESOPHAGOGASTRODUODENOSCOPY; N/A     Comment:  Procedure: ESOPHAGOGASTRODUODENOSCOPY (EGD);  Surgeon:               Manya Silvas, MD;  Location[facility] ENDOSCOPY;                Service: Endoscopy;  Laterality: N/A; No date: EYE SURGERY No date: JOINT REPLACEMENT No date: THYROIDECTOMY, PARTIAL     Comment:  unclear why 1999: [place]; Right 11/2007: US ECHOCARDIOGRAPHY     Comment:  nl LV fxn EF 63%, diastolic dysfunction, LVH, tr TR and               MR, slcerotic aortic valve     Reproductive/Obstetrics negative OB ROS                             Anesthesia Physical  Anesthesia Plan  ASA: 3  Anesthesia Plan: General   Post-op Pain Management:    Induction: Intravenous  PONV Risk Score and Plan: 4 or greater and Propofol infusion and TIVA  Airway Management Planned: LMA  Additional Equipment:   Intra-op Plan:   Post-operative Plan:   Informed Consent: I have reviewed the patients History and Physical, chart, labs and discussed the procedure including the risks, benefits and alternatives for the proposed anesthesia with the patient or authorized representative who has indicated his/her understanding and acceptance.     Dental Advisory Given  Plan Discussed with: Anesthesiologist, CRNA and Surgeon  Anesthesia Plan Comments: (Patient consented for risks of  anesthesia including but not limited to:  - adverse reactions to medications - risk  of airway placement if required - damage to eyes, teeth, lips or other oral mucosa - nerve damage due to positioning  - sore throat or hoarseness - Damage to heart, brain, nerves, lungs, other parts of body or loss of life  Patient voiced understanding.)        Anesthesia Quick Evaluation

## 2022-02-01 NOTE — Discharge Instructions (Signed)

## 2022-02-01 NOTE — Op Note (Signed)
[facility]  OPERATIVE NOTE   PROCEDURE:  Right upper arm brachial artery to axillary vein arteriovenous graft  PRE-OPERATIVE DIAGNOSIS: 1. end stage renal disease    POST-OPERATIVE DIAGNOSIS: same  SURGEON: Jean Skow  ASSISTANT(S): none  ANESTHESIA: general  ESTIMATED BLOOD LOSS: 35 cc  FINDING(S): none  SPECIMEN(S):  None  INDICATIONS:   Alexis Tucker is a 81 y.o. female who presents with end stage renal disease and need for dialysis access.  Risk, benefits, and alternatives to access surgery were discussed.  The patient is aware the risks include but are not limited to: bleeding, infection, steal syndrome, nerve damage, ischemic monomelic neuropathy, failure to mature, and need for additional procedures.  The patient is aware of the risks and elects to proceed forward.  DESCRIPTION: After full informed written consent was obtained from the patient, the patient was brought back to the operating room and placed supine upon the operating table.  The patient was given IV antibiotics prior to proceeding.  After obtaining adequate sedation, the patient was prepped and draped in standard fashion for a right arm access procedure.  I turned my attention first to the antecubitum.   I made an incision over the brachial artery, and dissected down through the subcutaneous tissue to the fascia carefully and was able to dissect out the brachial artery.  The artery was about patent and of adequate size to support a graft.  It was controlled proximally and distally with vessel loops and then I turned my attention to the high bicipital groove in the axilla.  I made an incision and dissected down through the subcutaneous tissue and fascia until I reached the axillary vein.  It was patent and adequate size for graft creation.  I then dissected this vein proximally and distal and prepared it for control with Bulldog clamps.  I took a Dietitian and dissected from the  antecubital up to the axillary incision.  Then I delivered the 6 mm PTFE, through this metal tunneler and then pulled out the metal tunneler leaving the graft in place and making sure the line was up for orientation.  I then gave the patient 3000 units of heparin to gain some anticoagulation.  After waiting 3 minutes, I placed the brachial artery under tension proximally and distally with vessel loops, made an arteriotomy and extended it with a Potts scissor.  I sewed the graft to this arteriotomy with a running stitch of CV-6 suture.  At this point, then I completed the anastomosis in the usual fashion.  I released the vessel loops on the inflow and allowed the artery to decompress through the graft. There was good pulsatile bleeding through this graft.  I clamped the graft near its arterial anastomosis and sucked out all the blood in the graft and loaded the graft with heparinized saline.  At this point, I pulled the graft to appropriate length and reset my exposure of the axillary vein.  Then, I controlled the vein with Bulldog clamps.  An anterior wall venotomy was created with an 11 blade and extended with Potts scissors.  I then spatulated the graft to facilitate an end-to- side anastomosis matching the arteriotomy.  In the process of spatulating, I cut the graft to appropriate length for this anastomosis.  This graft was sewn to the vein in an end-to-side configuration with a running CV-6 suture.  Prior to completing this anastomosis, I allowed the vein to back bleed and then I also allowed the  artery to bleed in an antegrade fashion.  I completed this anastomosis in the usual fashion, and then irrigated out the high bicipital exposure and then placed Surgicel and Evicel.  I then turned my attention back to the antecubitum.  There was pulsatile flow in the artery beyond the graft.   At this point, I washed out the antecubital incision. Surgicel and Evicel were then placed. There was no more active bleeding.   The subcutaneous tissue was reapproximated with a running stitch of 3-0 Vicryl.  The skin was then reapproximated with a running subcuticular 4-0 Monocryl.  The skin was then cleaned, dried, and Dermabond used to reinforce the skin closure.  We then turned our attention to the axillary incision.  The subcutaneous tissue was repaired with running stitch of 3-0 Vicryl.  The skin was then reapproximated with running subcuticular 4-0 Monocryl.  The skin was then cleaned, dried, and then the skin closure was reinforced with Dermabond.  The patient was then awakened from anesthesia and taken to the recovery room in stable condition having tolerated the procedure well.    COMPLICATIONS: None  CONDITION: Stable   Leotis Pain 02/01/2022 5:09 PM   This note was created with Dragon Medical transcription system. Any errors in dictation are purely unintentional.

## 2022-02-01 NOTE — Progress Notes (Signed)
  Chaplain On-Call responded to a call from [place], who reported that the patient's daughter has Advance Directives documents for the patient that she wants to have completed. Patient was scheduled for surgery at 1300.  Chaplain met daughter Santiago Glad and reviewed the AD documents with her. Chaplain visited the patient in [place] room 23 and confirmed her request. Chaplain confirmed that the patient has received no medication yet prior to the surgery.  Chaplain obtained assistance from [place], and Volunte[employer] and [employer], who witnessed patient's signature and notarized the Living Will and HCPOA documents.  Chaplain made necessary copies for patient's medical chart and for daughter; returned original to daughter.  Chaplain Pollyann Samples M.Div., [facility]

## 2022-02-02 ENCOUNTER — Encounter: Payer: Self-pay | Admitting: Family Medicine

## 2022-02-02 ENCOUNTER — Telehealth: Payer: Self-pay | Admitting: Family Medicine

## 2022-02-02 DIAGNOSIS — Z7189 Other specified counseling: Secondary | ICD-10-CM

## 2022-02-02 NOTE — Telephone Encounter (Signed)
Patient daughter Alexis Tucker came in office and brought a copy of the living will and has been placed in PCP folder.

## 2022-02-03 ENCOUNTER — Encounter: Payer: Self-pay | Admitting: Vascular Surgery

## 2022-02-03 NOTE — Telephone Encounter (Signed)
Placed in Dr. G's box.  

## 2022-02-03 NOTE — Anesthesia Postprocedure Evaluation (Signed)
Anesthesia Post Note  Patient: Alexis Tucker  Procedure(s) Performed: INSERTION OF ARTERIOVENOUS (AV) GORE-TEX GRAFT ARM (LEFT UPPER EXTREMITY) (Right)  Patient location during evaluation: PACU Anesthesia Type: General Level of consciousness: awake and alert Pain management: pain level controlled Vital Signs Assessment: post-procedure vital signs reviewed and stable Respiratory status: spontaneous breathing, nonlabored ventilation, respiratory function stable and patient connected to nasal cannula oxygen Cardiovascular status: blood pressure returned to baseline and stable Postop Assessment: no apparent nausea or vomiting Anesthetic complications: no   No notable events documented.   Last Vitals:  Vitals:   02/01/22 1805 02/01/22 1815  BP: (!) 183/63 (!) 173/60  Pulse: 72 74  Resp:    Temp:    SpO2: 96% 99%    Last Pain:  Vitals:   02/01/22 1805  TempSrc:   PainSc: 0-No pain                 Arita Miss

## 2022-02-08 ENCOUNTER — Other Ambulatory Visit: Payer: Self-pay

## 2022-02-08 ENCOUNTER — Ambulatory Visit (INDEPENDENT_AMBULATORY_CARE_PROVIDER_SITE_OTHER): Payer: Medicare Other | Admitting: Surgery

## 2022-02-08 ENCOUNTER — Encounter: Payer: Self-pay | Admitting: Surgery

## 2022-02-08 VITALS — BP 149/75 | HR 69 | Temp 98.5°F | Ht 63.0 in | Wt 181.0 lb

## 2022-02-08 DIAGNOSIS — Z170 Estrogen receptor positive status [ER+]: Secondary | ICD-10-CM

## 2022-02-08 DIAGNOSIS — Z853 Personal history of malignant neoplasm of breast: Secondary | ICD-10-CM

## 2022-02-08 DIAGNOSIS — C50412 Malignant neoplasm of upper-outer quadrant of left female breast: Secondary | ICD-10-CM | POA: Diagnosis not present

## 2022-02-08 NOTE — Patient Instructions (Signed)
Referral to Medical Oncology faxed today. Someone from their office will call you to schedule an appointment.    Breast Self-Awareness Breast self-awareness means being familiar with how your breasts look and feel. It involves checking your breasts regularly and telling your health care provider about any changes. Practicing breast self-awareness helps to maintain breast health. Sometimes, changes are not harmful (are benign). Other times, a change in your breasts can be a sign of a serious medical problem. Being familiar with the look and feel of your breasts can help you catch a breast problem while it is still small and can be treated. You should do breast self-exams even if you have breast implants. What you need: A mirror. A well-lit room. A pillow or other soft object. How to do a breast self-exam A breast self-exam is one way to learn what is normal for your breasts and whether your breasts are changing. To do a breast self-exam: Look for changes  Remove all the clothing above your waist. Stand in front of a mirror in a room with good lighting. Put your hands down at your sides. Compare your breasts in the mirror. Look for differences between them (asymmetry), such as: Differences in shape. Differences in size. Puckers, dips, and bumps in one breast and not the other. Look at each breast for changes in the skin, such as: Redness. Scaly areas. Skin thickening. Dimpling. Open sores (ulcers). Look for changes in your nipples, such as: Discharge. Bleeding. Dimpling. Redness. A nipple that looks pushed in (retracted), or that has changed position. Feel for changes Carefully feel your breasts for lumps and changes. It is best to do this self-exam while lying down. Follow these steps to feel each breast: Place a pillow under the shoulder of one side of your body. Place the arm of that side of your body behind your head. Feel the breast of that side of your body using the hand of  the opposite arm. To do this: Start in the nipple area and use the pads of your three middle fingers to make -inch (2 cm) overlapping circles. Use light, medium, and then firm pressure as you feel your breast, gently covering the entire breast area and armpit. Continue the overlapping circles, moving downward over the breast until you feel your ribs below your breast. Then, make circles with your fingers going upward until you reach your collarbone. Next, make circles by moving outward across your breast and into your armpit area. Squeeze the nipple. Check for discharge and lumps. Repeat steps 1-7 to check your other breast. Sit or stand in the tub or shower. With soapy water on your skin, feel each breast the same way you did when you were lying down. Write down what you find Writing down what you find can help you remember what to discuss with your health care provider. Write down: What is normal for each breast. Any changes that you find in each breast. These include: The kind of changes you find. Any pain or tenderness. Size and location of any lumps. Where you are in your menstrual cycle, if you are still getting your menstrual period (menstruating). General tips If you are breastfeeding, the best time to examine your breasts is after a feeding or after using a breast pump. If you menstruate, the best time to examine your breasts is 5-7 days after your menstrual period. Breasts are generally lumpier during menstrual periods, and it may be more difficult to notice changes. With time and practice, you  will become more familiar with the differences in your breasts and more comfortable with the exam. Contact a health care provider if: You see a change in the shape or size of your breasts or nipples. You see a change in the skin of your breast or nipples, such as a reddened or scaly area. You have unusual discharge from your nipples. You find a new lump or thick area. You have breast  pain. You have any concerns about your breast health. Summary Breast self-awareness includes looking for physical changes in your breasts and feeling for any changes within your breasts. Breast self-awareness should be done in front of a mirror in a well-lit room. If you menstruate, the best time to examine your breasts is 5-7 days after your menstrual period. Tell your health care provider about any changes you notice in your breasts. Changes include changes in size, changes on the skin, pain or tenderness, or unusual fluid from your nipples. This information is not intended to replace advice given to you by your health care provider. Make sure you discuss any questions you have with your health care provider. Document Revised: 05/10/2021 Document Reviewed: 04/21/2021 Elsevier Patient Education  Alexis Tucker.

## 2022-02-08 NOTE — Progress Notes (Signed)
02/08/2022  History of Present Illness: Alexis Tucker is a 81 y.o. female status post left lumpectomy and sentinel lymph node biopsy on 01/07/2018 for left breast cancer with Dr. Bary Castilla.  She completed radiation therapy and is currently on Arimidex which had been managed by Dr. Bary Castilla.  The last visit with him was on 01/07/2019.  She was scheduled to see me in July 2021 but she canceled 2 appointments that were made and that she was lost to follow-up.  She did have a bilateral mammogram in July 2020 and 2021 and had one again in March 2023.  But it looks that she has not been consistent with follow-ups.  She never had any appointments with oncology.  Today, the patient presents with her daughter who has been trying to get her back on track with her appointments.  The patient denies any pain in the left breast or in the right, any masses, skin changes, or drainage.  Of note, patient recently had a right AV graft between brachial artery and axillary vein.  Her right arm has some swelling and bruising.  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   Coronary artery calcification seen on CT scan    CVA (cerebral vascular accident) ([place])    a.) remotes infarct(s) noted on CT imaging from 08/17/2017 --> RIGHT parietotemporal infarct; lacunar infarct LEFT pons   Diabetes type 2, controlled ([place]) 2000s   Diabetic retinopathy with macular edema ([place]) 03/2014   a.) Avastin injections as Ramirez-Perez   DJD (degenerative joint disease)    ESRD (end stage renal disease) ([place])    GERD (gastroesophageal reflux disease)    Glaucoma 03/2014   History of colon cancer 2006   a.) s/p colectomy   History of hepatitis A 1990s   from food, resolved   HTN (hypertension)    Hyperplastic colon polyp    Long term current use of anticoagulant    a.) apixaban   Long term current use of antithrombotics/antiplatelets    a.) pentoxifylline   Macular degeneration    wet R with vision loss, dry L; to see retina specialist   Neuropathy    OA (osteoarthritis)    Osteonecrosis (HCC)    hips?   PONV (postoperative nausea and vomiting)    Post-surgical hypothyroidism    Thrombocytopenia (HCC)    Tubular adenoma    Wears dentures    partial upper     Past Surgical History: Past Surgical History:  Procedure Laterality Date   ABDOMINAL HYSTERECTOMY  1976   partial, after tubal pregnancy, h/o fibroids with heavy bleeding   AV FISTULA PLACEMENT Right 02/01/2022   Procedure: INSERTION OF ARTERIOVENOUS (AV) GORE-TEX GRAFT ARM (LEFT UPPER EXTREMITY);  Surgeon: Algernon Huxley, MD;  Location: [facility] ORS;  Service: Vascular;  Laterality: Right;   BREAST BIOPSY Left 2008   positive   BREAST BIOPSY Left 11/22/2017   2oclock 2cmfn wing shaped clip, INVASIVE MAMMARY CARCINOMA   BREAST BIOPSY Left 11/22/2017   2oclock 4cmfn coil shaped clip, INVASIVE MAMMARY CARCINOMA   BREAST BIOPSY Left 11/22/2017   lymph node -  butterfly hydroMARK   BREAST EXCISIONAL BIOPSY Left 01/07/2018   lumpectomy by Dr. Bary Castilla   BREAST LUMPECTOMY Left 2008   F/U radiation   BREAST LUMPECTOMY Left 2019   2 areas of [facility] at 2:00 position f/u with mammosite   BREAST LUMPECTOMY WITH SENTINEL LYMPH NODE BIOPSY Left 01/07/2018   Procedure: BREAST LUMPECTOMY WITH SENTINEL LYMPH NODE BX;  Surgeon: Robert Bellow, MD;  Location: [facility] ORS;  Service: General;  Laterality: Left;   BREAST MAMMOSITE  2008   carotid ultrasound  12/2012   1-39% bilateral stenosis, severe in ECA   CATARACT EXTRACTION W/PHACO Left 08/14/2016   Procedure: CATARACT EXTRACTION PHACO AND INTRAOCULAR LENS PLACEMENT ([place])  Left Diabetic;  Surgeon: Ronnell Freshwater, MD;  Location: [place];  Service: Ophthalmology;  Laterality: Left;  Diabetic - oral meds   CHOLECYSTECTOMY  2005   COLECTOMY  2005   colon cancer   COLON SURGERY  2006   bowel obstruction   COLONOSCOPY  2008   ext hem, ileo-colonic anastomosis in cecum, tubular adenoma x1, rec rpt 1 yr for multiple polyps (in [place])   COLONOSCOPY WITH PROPOFOL N/A 10/19/2021   2.5cm TVA with low grade dysplasia, diverticulosis, no rpt recommended Vicente Males, Bailey Mech, MD)   ESOPHAGOGASTRODUODENOSCOPY N/A 05/06/2016   Procedure: ESOPHAGOGASTRODUODENOSCOPY (EGD);  Surgeon: Manya Silvas, MD;  Location: [facility] ENDOSCOPY;  Service: Endoscopy;  Laterality: N/A;   EYE SURGERY     JOINT REPLACEMENT     THYROIDECTOMY, PARTIAL     unclear why   TOTAL KNEE ARTHROPLASTY Right 1999   US ECHOCARDIOGRAPHY  11/2007   nl LV fxn EF 16%, diastolic dysfunction, LVH, tr TR and MR, slcerotic aortic valve    Home Medications: Prior to Admission medications   Medication Sig Start Date End Date Taking? Authorizing Provider  allopurinol (ZYLOPRIM) 100 MG tablet Take 1 tablet (100 mg total) by mouth daily. 07/14/21  Yes Ria Bush, MD  amLODipine (NORVASC) 5 MG tablet Take 1 tablet (5 mg total) by mouth daily.  11/24/21 02/23/23 Yes Agbor-Etang, Aaron Edelman, MD  anastrozole (ARIMIDEX) 1 MG tablet Take 1 tablet (1 mg total) by mouth daily. 01/20/22  Yes Pabon, Diego F, MD  apixaban (ELIQUIS) 5 MG TABS tablet Take 1 tablet (5 mg total) by mouth 2 (two) times daily. 11/25/21  Yes Ria Bush, MD  Blood Glucose Monitoring [facility] BLOOD GLUCOSE METER) W/DEVICE KIT Use as directed to check sugars daily 250.42 03/13/14  Yes Ria Bush, MD  CALCIUM-MAGNESIUM PO Take 1 tablet by mouth daily.   Yes [provider]  carvedilol (COREG) 25 MG tablet Take 12.5 mg by mouth 2 (two) times daily with a meal.   Yes [provider]  Cholecalciferol (VITAMIN D-3) 5000 UNITS TABS Take 1 tablet by mouth daily.   Yes [provider]  HYDROcodone-acetaminophen (NORCO/VICODIN) 5-325 MG tablet  Take 2 tablets by mouth every 6 (six) hours as needed for moderate pain. 02/01/22 02/01/23 Yes Dew, Erskine Squibb, MD  isosorbide dinitrate (ISORDIL) 20 MG tablet Take 1 tablet (20 mg total) by mouth daily. 07/14/21  Yes Ria Bush, MD  levothyroxine (SYNTHROID) 88 MCG tablet TAKE 1 TABLET BY MOUTH EVERY DAY 11/30/21  Yes Ria Bush, MD  pantoprazole (PROTONIX) 40 MG tablet Take 1 tablet (40 mg total) by mouth daily as needed. 11/25/21  Yes Ria Bush, MD  pentoxifylline (TRENTAL) 400 MG CR tablet Take 1 tablet (400 mg total) by mouth daily. 07/15/21  Yes Ria Bush, MD  saxagliptin HCl (ONGLYZA) 2.5 MG TABS tablet Take 1 tablet (2.5 mg total) by mouth daily. 09/19/21  Yes Ria Bush, MD    Allergies: Allergies  Allergen Reactions   Tramadol Other (See Comments)    nightmares   Codeine Anxiety   Sulfa Antibiotics Itching and Rash    Review of Systems: Review of Systems  Constitutional:  Negative for chills and fever.  Respiratory:  Negative for shortness of breath.   Cardiovascular:  Negative for chest pain.  Gastrointestinal:  Negative for abdominal pain, nausea and vomiting.     Physical Exam BP (!) 149/75   Pulse 69   Temp 98.5 F (36.9 C) (Oral)   Ht 5' 3" (1.6 m)   Wt 181 lb (82.1 kg)   BMI 32.06 kg/m  CONSTITUTIONAL: No acute distress, well-nourished HEENT:  Normocephalic, atraumatic, extraocular motion intact. RESPIRATORY:  Normal respiratory effort without pathologic use of accessory muscles. CARDIOVASCULAR: Regular rhythm and rate BREAST: Left breast status postlumpectomy the upper outer quadrant scar well-healed and with radiation changes in the left breast.  No other skin changes, nipple changes, or palpable masses.  Left axillary incision also well-healed with no palpable lymphadenopathy in the left axilla.  Right breast without any palpable masses, skin changes, or nipple changes.  No right axillary lymphadenopathy. NEUROLOGIC:  Motor and sensation is grossly normal.  Cranial nerves are grossly intact. PSYCH:  Alert and oriented to person, place and time. Affect is normal.  Labs/Imaging: Mammogram on 09/28/2021: FINDINGS: There are no findings suspicious for malignancy.   IMPRESSION: No mammographic evidence of malignancy. A result letter of this screening mammogram will be mailed directly to the patient.   RECOMMENDATION: Screening mammogram in one year. (Code:SM-B-01Y)   BI-RADS CATEGORY  1: Negative.  Assessment and Plan: This is a 81 y.o. female status post left breast lumpectomy and sentinel lymph node biopsy.  - The patient is now 4 years out from her surgery and has been doing well.  Exam is reassuring today and her mammogram from March 2023 also does not show any suspicious findings.  Discussed with the patient and her daughter that we will send a referral to the oncology team to help [place] manage her Arimidex and for further follow-ups given her cancer history.  She has had left breast cancer as well in 2008.  From the surgical standpoint, currently she is doing well no other procedures are needed at this point and given her reassuring  mammograms, she does not need any further follow-up with [place].  Discussed with her that if there is any concerns, new findings, or any issues, we will be happy to see her again as needed.  I spent 20 minutes dedicated to the care of this patient on the date of this encounter to include pre-visit review of records, face-to-face time with the patient discussing diagnosis and management, and any  post-visit coordination of care.   Melvyn Neth, [facility]

## 2022-02-09 ENCOUNTER — Encounter: Payer: Self-pay | Admitting: Family Medicine

## 2022-02-09 DIAGNOSIS — Z95828 Presence of other vascular implants and grafts: Secondary | ICD-10-CM | POA: Insufficient documentation

## 2022-02-11 NOTE — Telephone Encounter (Signed)
Noted. Will send for scan into system. Updated records.

## 2022-02-19 NOTE — Assessment & Plan Note (Signed)
#  2019- [Dr.Byrnett] ipsilateral /new diagnosis left breast ER PR positive HER-2/neu-NEGATIVE.  Status post left mastectomy mpT2 pN0. G-2; No Oncotype-on anastrozole [Dr.Byrnett].   # CKD- Stage IV- stable [Dr.Singh]  # Mild Anemia- Hb 11 sec to CKD- Stable  # Mild chronic thrombocytopenia- ~110-120 ? Etiology- Stable/asymtomatic.   Thank you Dr.Pi scoyafor allowing me to participate in the care of your pleasant patient. Please do not hesitate to contact me with questions or concerns in the interim.   # 60 minutes face-to-face with the patient discussing the above plan of care; more than 50% of time spent on prognosis/ natural history; counseling and coordination.

## 2022-02-19 NOTE — Progress Notes (Unsigned)
[facility] NOTE  Patient Care Team: Ria Bush, MD as PCP - General (Family Medicine) Cammie Sickle, MD as Consulting Physician (Oncology)  CHIEF COMPLAINTS/PURPOSE OF CONSULTATION:  Breast cancer  #  Oncology History Overview Note  # 2008- Left breast ductal ca- [T=2.3 cm; N= 0/8; ]mammosite;[facility]; DC No chemo; On arimidex  # MAY 2019- LEFT BREAST 2'O clock- mammo/US- 4 clusters [spanning 6 cm] 2 nodules- Bx- IMC; ER/PR- POS; Her 2 Neu-Pending. LN Bx- NEG CANCER CASE SUMMARY: INVASIVE CARCINOMA OF THE BREAST  Procedure: Wide excision  Specimen Laterality: Left  Tumor Size: 25 mm  Histologic Type: Invasive carcinoma of no special type  Histologic Grade (Nottingham Histologic Score)                       Glandular (Acinar)/Tubular Differentiation: 3                       Nuclear Pleomorphism: 2                       Mitotic Rate: 2                       Overall Grade: 2  Ductal Carcinoma In Situ (DCIS): Present, mass A  Margins:  Invasive Carcinoma Margins: Uninvolved  Distance from closest margin: A. <1 mm medial; C. 7 mm deep; D. 12 mm  deep; E. 12 mm deep                       DCIS Margins: Uninvolved by DCIS                       Distance from closest margin: 9 mm                       Specify closest margin: Inferior   Regional Lymph Nodes: Uninvolved by tumor cells  Number of Lymph Nodes Examined: 3  Number of Sentinel Nodes Examined: 3  Lymphovascular Invasion: Present  Pathologic Stage Classification (pTNM, AJCC 8th Edition): mpT2 pN0 (sn)  TNM Descriptors: m multiple foci of invasive carcinoma   Biomarkers were performed on a separate specimen in summary: ER and PR  positive, HER-2 FISH negative.     # CKD stage IV [Dr.Singh]; DM; macular edema [injections into eye]; Hx of colon ca   Malignant neoplasm of upper-outer quadrant of left breast in female, estrogen receptor positive ([place])  12/03/2017 Initial Diagnosis    Carcinoma of upper-outer quadrant of left breast in female, estrogen receptor positive ([place])      HISTORY OF PRESENTING ILLNESS: In a wheelchair.  Accompanied by her daughter Neva Ramaswamy 81 y.o.  female history of LEFT breast cancer x 2  in 2008 & 2019- mpT2pN0-ER/PR positive HER2 negative is here to establish care.  Patient s/p surgery/mastectomy in 2019 and being followed by Dr. Bary Castilla.  However, more recently evaluated by Dr. Hampton Abbot.  Patient has been referred to [place] to further establish medical oncology care/recommendations for anastrozole.  Pt been off anastrazole off more than 8 months. Does "not like taking pills".   Patient has chronic joint pains; chronic back pain.  This is not any worse.  Denies any unusual shortness of breath cough.   Review of Systems  Constitutional:  Negative for chills, diaphoresis, fever, malaise/fatigue and weight loss.  HENT:  Negative for nosebleeds and sore throat.   Eyes:  Negative for double vision.  Respiratory:  Negative for cough, hemoptysis, sputum production, shortness of breath and wheezing.   Cardiovascular:  Negative for chest pain, palpitations, orthopnea and leg swelling.  Gastrointestinal:  Negative for abdominal pain, blood in stool, constipation, diarrhea, heartburn, melena, nausea and vomiting.  Genitourinary:  Negative for dysuria, frequency and urgency.  Musculoskeletal:  Positive for back pain and joint pain.  Skin: Negative.  Negative for itching and rash.  Neurological:  Negative for dizziness, tingling, focal weakness, weakness and headaches.  Endo/Heme/Allergies:  Does not bruise/bleed easily.  Psychiatric/Behavioral:  Negative for depression. The patient is not nervous/anxious and does not have insomnia.      MEDICAL HISTORY:  Past Medical History:  Diagnosis Date  . Anemia   . Aortic stenosis 11/16/2021   a.) TTE 11/16/2021: EF 55-60%, mild AS (MPF 3.3 mmHg); AVA (VTI) = 2.52 cm  . Atrial fibrillation ([place])    a.)  CHA2DS2-VASc = 9 (age x 2, sex, CHF, HTN, CVA x2, vascular disease history, T2DM);  b.) rate/rhythm maintained on oral carvedilol; chronically anticoagulated using apixban  . Breast cancer, left ([place]) 09/19/2006   a.) moderately differentiated IMC (G2, T1N0Mx); s/p lumpectomy + adjuvant XRT  . Breast cancer, left ([place]) 11/27/2017   a.) IMC (mpT2 pN0, G2, ER/PR positive, HER-2/neu negative); b.) mammosite  . Carotid stenosis, asymptomatic 01/10/2013   a.) carotid dopplers 01/10/2013, 02/27/2014, 09/10/2017, 07/26/2020: >50% BECA, 1-39% BICA  . Cataracts, bilateral   . Chronic diastolic CHF (congestive heart failure) ([place])    a.) TTE 11/26/2007: EF 60%, LVH, triv TR/MR, AoV sclerosis with no stenosis, G1DD; b.) TTE 05/04/2016: EF 60-65%, LAE, G1DD; c.) TTE 11/16/2021: EF 55-60%, LVH, RVE, mild-mod LAE, mod MAC, mod MR, mild TR/AR, G2DD.  Marland Kitchen Coronary artery calcification seen on CT scan   . CVA (cerebral vascular accident) ([place])    a.) remotes infarct(s) noted on CT imaging from 08/17/2017 --> RIGHT parietotemporal infarct; lacunar infarct LEFT pons  . Diabetes type 2, controlled ([place]) 2000s  . Diabetic retinopathy with macular edema ([place]) 03/2014   a.) Avastin injections as [facility]  . DJD (degenerative joint disease)   . ESRD (end stage renal disease) ([place])   . GERD (gastroesophageal reflux disease)   . Glaucoma 03/2014  . History of colon cancer 2006   a.) s/p colectomy  . History of hepatitis A 1990s   from food, resolved  . HTN (hypertension)   . Hyperplastic colon polyp   . Long term current use of anticoagulant    a.) apixaban  . Long term current use of antithrombotics/antiplatelets    a.) pentoxifylline  . Macular degeneration    wet R with vision loss, dry L; to see retina specialist  . Neuropathy   . OA (osteoarthritis)   . Osteonecrosis ([facility])    hips?  Marland Kitchen PONV (postoperative nausea and vomiting)   . Post-surgical hypothyroidism   . Thrombocytopenia ([place])   .  Tubular adenoma   . Wears dentures    partial upper    SURGICAL HISTORY: Past Surgical History:  Procedure Laterality Date  . ABDOMINAL HYSTERECTOMY  1976   partial, after tubal pregnancy, h/o fibroids with heavy bleeding  . AV FISTULA PLACEMENT Right 02/01/2022   Procedure: INSERTION OF ARTERIOVENOUS (AV) GORE-TEX GRAFT ARM (LEFT UPPER EXTREMITY);  Surgeon: Algernon Huxley, MD;  Location: [facility] ORS;  Service: Vascular;  Laterality: Right;  . BREAST BIOPSY Left 2008  positive  . BREAST BIOPSY Left 11/22/2017   2oclock 2cmfn wing shaped clip, INVASIVE MAMMARY CARCINOMA  . BREAST BIOPSY Left 11/22/2017   2oclock 4cmfn coil shaped clip, INVASIVE MAMMARY CARCINOMA  . BREAST BIOPSY Left 11/22/2017   lymph node - butterfly hydroMARK  . BREAST EXCISIONAL BIOPSY Left 01/07/2018   lumpectomy by Dr. Bary Castilla  . BREAST LUMPECTOMY Left 2008   F/U radiation  . BREAST LUMPECTOMY Left 2019   2 areas of [place] at 2:00 position f/u with mammosite  . BREAST LUMPECTOMY WITH SENTINEL LYMPH NODE BIOPSY Left 01/07/2018   Procedure: BREAST LUMPECTOMY WITH SENTINEL LYMPH NODE BX;  Surgeon: Robert Bellow, MD;  Location: [facility] ORS;  Service: General;  Laterality: Left;  . BREAST MAMMOSITE  2008  . carotid ultrasound  12/2012   1-39% bilateral stenosis, severe in ECA  . CATARACT EXTRACTION W/PHACO Left 08/14/2016   Procedure: CATARACT EXTRACTION PHACO AND INTRAOCULAR LENS PLACEMENT (IOC)  Left Diabetic;  Surgeon: Ronnell Freshwater, MD;  Location: [facility];  Service: Ophthalmology;  Laterality: Left;  Diabetic - oral meds  . CHOLECYSTECTOMY  2005  . COLECTOMY  2005   colon cancer  . COLON SURGERY  2006   bowel obstruction  . COLONOSCOPY  2008   ext hem, ileo-colonic anastomosis in cecum, tubular adenoma x1, rec rpt 1 yr for multiple polyps (in [place])  . COLONOSCOPY WITH PROPOFOL N/A 10/19/2021   2.5cm TVA with low grade dysplasia, diverticulosis, no rpt recommended Vicente Males, Bailey Mech, MD)  .  ESOPHAGOGASTRODUODENOSCOPY N/A 05/06/2016   Procedure: ESOPHAGOGASTRODUODENOSCOPY (EGD);  Surgeon: Manya Silvas, MD;  Location: [facility] ENDOSCOPY;  Service: Endoscopy;  Laterality: N/A;  . EYE SURGERY    . JOINT REPLACEMENT    . THYROIDECTOMY, PARTIAL     unclear why  . TOTAL KNEE ARTHROPLASTY Right 1999  . US ECHOCARDIOGRAPHY  11/2007   nl LV fxn EF 37%, diastolic dysfunction, LVH, tr TR and MR, slcerotic aortic valve    SOCIAL HISTORY: Social History   Socioeconomic History  . Marital status: Divorced    Spouse name: Not on file  . Number of children: Not on file  . Years of education: Not on file  . Highest education level: Not on file  Occupational History  . Not on file  Tobacco Use  . Smoking status: Former  . Smokeless tobacco: Never  . Tobacco comments:    quit over 40 yrs ago  Vaping Use  . Vaping Use: Never used  Substance and Sexual Activity  . Alcohol use: Never    Comment: rare  . Drug use: Never  . Sexual activity: Not Currently  Other Topics Concern  . Not on file  Social History Narrative   Lives alone, 1 dog.   Granddaughter in [place] (attorney).   Divorced   Occu: retired, worked in Engineer, technical sales   Edu: college   Social Determinants of Radio broadcast assistant Strain: [place]  (12/28/2021)   Overall Financial Resource Strain (CARDIA)   . Difficulty of Paying Living Expenses: Not hard at all  Food Insecurity: No Food Insecurity (12/28/2021)   Hunger Vital Sign   . Worried About Charity fundraiser in the Last Year: Never true   . Ran Out of Food in the Last Year: Never true  Transportation Needs: No Transportation Needs (12/28/2021)   PRAPARE - Transportation   . Lack of Transportation (Medical): No   . Lack of Transportation (Non-Medical): No  Physical Activity: Inactive (12/28/2021)  Exercise Vital Sign   . Days of Exercise per Week: 0 days   . Minutes of Exercise per Session: 0 min  Stress: No Stress Concern Present (12/28/2021)   [place]   . Feeling of Stress : Not at all  Social Connections: Not on file  Intimate Partner Violence: Not on file    FAMILY HISTORY: Family History  Problem Relation Age of Onset  . Diabetes Mother   . Hypertension Mother   . Arthritis Mother   . Alcohol abuse Father   . Arthritis Father   . Stroke Daughter   . CAD Neg Hx   . Cancer Neg Hx   . Breast cancer Neg Hx     ALLERGIES:  is allergic to tramadol, codeine, and sulfa antibiotics.  MEDICATIONS:  Current Outpatient Medications  Medication Sig Dispense Refill  . allopurinol (ZYLOPRIM) 100 MG tablet Take 1 tablet (100 mg total) by mouth daily. 90 tablet 3  . amLODipine (NORVASC) 5 MG tablet Take 1 tablet (5 mg total) by mouth daily. 30 tablet 3  . apixaban (ELIQUIS) 5 MG TABS tablet Take 1 tablet (5 mg total) by mouth 2 (two) times daily. 60 tablet 6  . Blood Glucose Monitoring Suppl (ACURA BLOOD GLUCOSE METER) W/DEVICE KIT Use as directed to check sugars daily 250.42 1 kit 0  . CALCIUM-MAGNESIUM PO Take 1 tablet by mouth daily.    . carvedilol (COREG) 25 MG tablet Take 12.5 mg by mouth 2 (two) times daily with a meal.    . Cholecalciferol (VITAMIN D-3) 5000 UNITS TABS Take 1 tablet by mouth daily.    Marland Kitchen HYDROcodone-acetaminophen (NORCO/VICODIN) 5-325 MG tablet Take 2 tablets by mouth every 6 (six) hours as needed for moderate pain. 20 tablet 0  . isosorbide dinitrate (ISORDIL) 20 MG tablet Take 1 tablet (20 mg total) by mouth daily. 90 tablet 3  . levothyroxine (SYNTHROID) 88 MCG tablet TAKE 1 TABLET BY MOUTH EVERY DAY 90 tablet 3  . pantoprazole (PROTONIX) 40 MG tablet Take 1 tablet (40 mg total) by mouth daily as needed. 90 tablet 1  . pentoxifylline (TRENTAL) 400 MG CR tablet Take 1 tablet (400 mg total) by mouth daily. 90 tablet 3  . saxagliptin HCl (ONGLYZA) 2.5 MG TABS tablet Take 1 tablet (2.5 mg total) by mouth daily. 30 tablet 11  . anastrozole (ARIMIDEX) 1 MG  tablet Take 1 tablet (1 mg total) by mouth daily. 90 tablet 1   No current facility-administered medications for this visit.      Marland Kitchen  PHYSICAL EXAMINATION: ECOG PERFORMANCE STATUS: 0 - Asymptomatic  Vitals:   02/20/22 1131  BP: 120/73  Pulse: 86  Resp: 17  Temp: (!) 97 F (36.1 C)    Filed Weights   02/20/22 1131  Weight: 181 lb 6.4 oz (82.3 kg)     Physical Exam HENT:     Head: Normocephalic and atraumatic.     Mouth/Throat:     Pharynx: No oropharyngeal exudate.  Eyes:     Pupils: Pupils are equal, round, and reactive to light.  Cardiovascular:     Rate and Rhythm: Normal rate and regular rhythm.  Pulmonary:     Effort: No respiratory distress.     Breath sounds: No wheezing.  Abdominal:     General: Bowel sounds are normal. There is no distension.     Palpations: Abdomen is soft. There is no mass.     Tenderness: There is  no abdominal tenderness. There is no guarding or rebound.  Musculoskeletal:        General: No tenderness. Normal range of motion.     Cervical back: Normal range of motion and neck supple.  Skin:    General: Skin is warm.  Neurological:     Mental Status: She is alert and oriented to person, place, and time.  Psychiatric:        Mood and Affect: Affect normal.   Left breast 2 o'clock position-hard nodules/mobile approximately 4 cm in size.  Scarring from left lumpectomy noted; no axillary adenopathy.  LABORATORY DATA:  I have reviewed the data as listed Lab Results  Component Value Date   WBC 5.2 11/25/2021   HGB 9.5 (L) 02/01/2022   HCT 28.0 (L) 02/01/2022   MCV 93.6 11/25/2021   PLT 111.0 (L) 11/25/2021   Recent Labs    11/15/21 1522 11/15/21 1722 11/16/21 0504 11/25/21 1217 02/01/22 1218  NA 142  --  142 140 142  K 5.0  --  4.6 4.7 5.1  CL 111  --  115* 111 114*  CO2 20*  --  19* 22  --   GLUCOSE 123*  --  112* 117* 118*  BUN 39*  --  39* 42* 45*  CREATININE 4.72*  --  4.78* 5.16* 5.70*  CALCIUM 9.4  --  9.2 9.6  --    GFRNONAA 9*  --  9*  --   --   PROT  --  7.5  --   --   --   ALBUMIN  --  3.8  --  3.8  --   AST  --  15  --   --   --   ALT  --  10  --   --   --   ALKPHOS  --  70  --   --   --   BILITOT  --  0.5  --   --   --   BILIDIR  --  0.1  --   --   --   IBILI  --  0.4  --   --   --     RADIOGRAPHIC STUDIES: I have personally reviewed the radiological images as listed and agreed with the findings in the report. No results found.   ASSESSMENT & PLAN:   Malignant neoplasm of upper-outer quadrant of left breast in female, estrogen receptor positive ([place]) # 2019- [Dr.Byrnett] ipsilateral /new diagnosis left breast ER PR positive HER-2/neu-NEGATIVE.  Status post left mastectomy mpT2 pN0. G-2; No Oncotype-on anastrozole [Dr.Byrnett].  Called in a prescription for anastrozole.   #Discussed length of anastrozole is anywhere between 5 to 10 years.  Would prefer 10 years; however this is negotiable based on patient's tolerance/risk of recurrence etc.  # Continue- Anemia: May 2023 hemoglobin 9-10; iron saturation-17%; continue oral iron/Vitron C.  Consider Venofer at next visit.  Patient doing labs with nephrology later this week.  # CKD- Stag V- awaiting dialysis if worse.  Okay to proceed with kidney transplant if possible- [Dr.Singh].  No major concerns from immunosuppression standpoint with her history of cancer.  Discussed that her cancer cure is in order of 80% or so.  # Mild chronic thrombocytopenia- ~110-120 ? Etiology- Stable/asymtomatic.  Discussed regarding further work-up including imaging/bone marrow.  However, hold off any further work-up at this time.  # DISPOSITION: NO labs today # follow up in 3 months- MD: labs- cbc/cmp; iron studies; ferritin- LDH; haptoglobin; B12 levels; possible  venofer-Dr.B  Thank you Dr.Piscoyafor allowing me to participate in the care of your pleasant patient. Please do not hesitate to contact me with questions or concerns in the interim.   # 60 minutes  face-to-face with the patient discussing the above plan of care; more than 50% of time spent on prognosis/ natural history; counseling and coordination.    All questions were answered. The patient knows to call the clinic with any problems, questions or concerns.    Cammie Sickle, MD 02/20/2022 2:09 PM

## 2022-02-20 ENCOUNTER — Encounter: Payer: Self-pay | Admitting: Internal Medicine

## 2022-02-20 ENCOUNTER — Inpatient Hospital Stay: Payer: Medicare Other

## 2022-02-20 ENCOUNTER — Inpatient Hospital Stay: Payer: Medicare Other | Attending: Internal Medicine | Admitting: Internal Medicine

## 2022-02-20 DIAGNOSIS — D696 Thrombocytopenia, unspecified: Secondary | ICD-10-CM | POA: Insufficient documentation

## 2022-02-20 DIAGNOSIS — Z170 Estrogen receptor positive status [ER+]: Secondary | ICD-10-CM | POA: Insufficient documentation

## 2022-02-20 DIAGNOSIS — Z87891 Personal history of nicotine dependence: Secondary | ICD-10-CM | POA: Diagnosis not present

## 2022-02-20 DIAGNOSIS — D649 Anemia, unspecified: Secondary | ICD-10-CM | POA: Diagnosis not present

## 2022-02-20 DIAGNOSIS — Z7901 Long term (current) use of anticoagulants: Secondary | ICD-10-CM | POA: Insufficient documentation

## 2022-02-20 DIAGNOSIS — N184 Chronic kidney disease, stage 4 (severe): Secondary | ICD-10-CM | POA: Diagnosis not present

## 2022-02-20 DIAGNOSIS — E611 Iron deficiency: Secondary | ICD-10-CM

## 2022-02-20 DIAGNOSIS — C50412 Malignant neoplasm of upper-outer quadrant of left female breast: Secondary | ICD-10-CM | POA: Insufficient documentation

## 2022-02-20 MED ORDER — ANASTROZOLE 1 MG PO TABS: 1.0000 mg | ORAL_TABLET | Freq: Every day | ORAL | 1 refills | Status: DC

## 2022-02-20 NOTE — Progress Notes (Signed)
Patient here for initial oncology appointment, she sates she was diagnosed with breast cancer before. Has no new symptoms or concerns to report today

## 2022-02-23 DIAGNOSIS — I10 Essential (primary) hypertension: Secondary | ICD-10-CM | POA: Diagnosis not present

## 2022-02-23 DIAGNOSIS — N2581 Secondary hyperparathyroidism of renal origin: Secondary | ICD-10-CM | POA: Diagnosis not present

## 2022-02-23 DIAGNOSIS — D631 Anemia in chronic kidney disease: Secondary | ICD-10-CM | POA: Diagnosis not present

## 2022-02-23 DIAGNOSIS — I129 Hypertensive chronic kidney disease with stage 1 through stage 4 chronic kidney disease, or unspecified chronic kidney disease: Secondary | ICD-10-CM | POA: Diagnosis not present

## 2022-02-23 DIAGNOSIS — N185 Chronic kidney disease, stage 5: Secondary | ICD-10-CM | POA: Diagnosis not present

## 2022-02-24 ENCOUNTER — Encounter: Payer: Self-pay | Admitting: Cardiology

## 2022-02-24 ENCOUNTER — Ambulatory Visit (INDEPENDENT_AMBULATORY_CARE_PROVIDER_SITE_OTHER): Payer: Medicare Other | Admitting: Cardiology

## 2022-02-24 VITALS — BP 140/60 | HR 70 | Ht 63.0 in | Wt 181.5 lb

## 2022-02-24 DIAGNOSIS — I10 Essential (primary) hypertension: Secondary | ICD-10-CM | POA: Diagnosis not present

## 2022-02-24 DIAGNOSIS — I480 Paroxysmal atrial fibrillation: Secondary | ICD-10-CM | POA: Diagnosis not present

## 2022-02-24 DIAGNOSIS — I350 Nonrheumatic aortic (valve) stenosis: Secondary | ICD-10-CM

## 2022-02-24 NOTE — Progress Notes (Signed)
Cardiology Office Note:    Date:  02/24/2022   ID:  Alexis Tucker, DOB December 11, 1940, MRN [id number]  PCP:  Alexis Bush, MD   [place] Providers Cardiologist:  None     Referring MD: Alexis Bush, MD   Chief Complaint  Patient presents with   Other    3 month f/u no complaints today. Meds reviewed verbally with pt.    History of Present Illness:    Alexis Tucker is a 81 y.o. female with a hx of paroxysmal atrial fibrillation, hypertension, diabetes, CKD 5 starting HD who presents for follow-up.  Previously seen for hypertension and atrial fibrillation, Norvasc was started for additional BP control.  States blood pressures have been well controlled at home, systolics in the 017B to 939Q.  Tolerating Eliquis for stroke prophylaxis without any adverse effects or bleeding issues.  Denies palpitations.  Prior notes Echocardiogram 11/17/2021 EF 55 to 60%, mild aortic valve stenosis.   Past Medical History:  Diagnosis Date   Anemia    Aortic stenosis 11/16/2021   a.) TTE 11/16/2021: EF 55-60%, mild AS (MPF 3.3 mmHg); AVA (VTI) = 2.52 cm   Atrial fibrillation (HCC)    a.) CHA2DS2-VASc = 9 (age x 2, sex, CHF, HTN, CVA x2, vascular disease history, T2DM);  b.) rate/rhythm maintained on oral carvedilol; chronically anticoagulated using apixban   Breast cancer, left ([place]) 09/19/2006   a.) moderately differentiated [place] (G2, T1N0Mx); s/p lumpectomy + adjuvant XRT   Breast cancer, left ([place]) 11/27/2017   a.) [place] (mpT2 pN0, G2, ER/PR positive, HER-2/neu negative); b.) mammosite   Carotid stenosis, asymptomatic 01/10/2013   a.) carotid dopplers 01/10/2013, 02/27/2014, 09/10/2017, 07/26/2020: >50% BECA, 1-39% BICA   Cataracts, bilateral    Chronic diastolic CHF (congestive heart failure) ([place])    a.) TTE 11/26/2007: EF 60%, LVH, triv TR/MR, AoV sclerosis with no stenosis, G1DD; b.) TTE 05/04/2016: EF 60-65%, LAE, G1DD; c.) TTE 11/16/2021: EF 55-60%, LVH, RVE, mild-mod LAE, mod  MAC, mod MR, mild TR/AR, G2DD.   Coronary artery calcification seen on CT scan    CVA (cerebral vascular accident) ([place])    a.) remotes infarct(s) noted on CT imaging from 08/17/2017 --> RIGHT parietotemporal infarct; lacunar infarct LEFT pons   Diabetes type 2, controlled ([place]) 2000s   Diabetic retinopathy with macular edema ([place]) 03/2014   a.) Avastin injections as [place]   DJD (degenerative joint disease)    ESRD (end stage renal disease) ([place])    GERD (gastroesophageal reflux disease)    Glaucoma 03/2014   History of colon cancer 2006   a.) s/p colectomy   History of hepatitis A 1990s   from food, resolved   HTN (hypertension)    Hyperplastic colon polyp    Long term current use of anticoagulant    a.) apixaban   Long term current use of antithrombotics/antiplatelets    a.) pentoxifylline   Macular degeneration    wet R with vision loss, dry L; to see retina specialist   Neuropathy    OA (osteoarthritis)    Osteonecrosis (HCC)    hips?   PONV (postoperative nausea and vomiting)    Post-surgical hypothyroidism    Thrombocytopenia (HCC)    Tubular adenoma    Wears dentures    partial upper    Past Surgical History:  Procedure Laterality Date   ABDOMINAL HYSTERECTOMY  1976   partial, after tubal pregnancy, h/o fibroids with heavy bleeding   AV FISTULA PLACEMENT Right 02/01/2022   Procedure: INSERTION  OF ARTERIOVENOUS (AV) GORE-TEX GRAFT ARM (LEFT UPPER EXTREMITY);  Surgeon: Algernon Huxley, MD;  Location: [facility] ORS;  Service: Vascular;  Laterality: Right;   BREAST BIOPSY Left 2008   positive   BREAST BIOPSY Left 11/22/2017   2oclock 2cmfn wing shaped clip, INVASIVE MAMMARY CARCINOMA   BREAST BIOPSY Left 11/22/2017   2oclock 4cmfn coil shaped clip, INVASIVE MAMMARY CARCINOMA   BREAST BIOPSY Left 11/22/2017   lymph node - butterfly hydroMARK   BREAST EXCISIONAL BIOPSY Left 01/07/2018   lumpectomy by Dr. Bary Castilla   BREAST LUMPECTOMY Left 2008   F/U radiation    BREAST LUMPECTOMY Left 2019   2 areas of [facility] at 2:00 position f/u with mammosite   BREAST LUMPECTOMY WITH SENTINEL LYMPH NODE BIOPSY Left 01/07/2018   Procedure: BREAST LUMPECTOMY WITH SENTINEL LYMPH NODE BX;  Surgeon: Robert Bellow, MD;  Location: [facility] ORS;  Service: General;  Laterality: Left;   BREAST MAMMOSITE  2008   carotid ultrasound  12/2012   1-39% bilateral stenosis, severe in ECA   CATARACT EXTRACTION W/PHACO Left 08/14/2016   Procedure: CATARACT EXTRACTION PHACO AND INTRAOCULAR LENS PLACEMENT ([place])  Left Diabetic;  Surgeon: Ronnell Freshwater, MD;  Location: [place];  Service: Ophthalmology;  Laterality: Left;  Diabetic - oral meds   CHOLECYSTECTOMY  2005   COLECTOMY  2005   colon cancer   COLON SURGERY  2006   bowel obstruction   COLONOSCOPY  2008   ext hem, ileo-colonic anastomosis in cecum, tubular adenoma x1, rec rpt 1 yr for multiple polyps (in [place])   COLONOSCOPY WITH PROPOFOL N/A 10/19/2021   2.5cm TVA with low grade dysplasia, diverticulosis, no rpt recommended Vicente Males, Bailey Mech, MD)   ESOPHAGOGASTRODUODENOSCOPY N/A 05/06/2016   Procedure: ESOPHAGOGASTRODUODENOSCOPY (EGD);  Surgeon: Manya Silvas, MD;  Location: [facility] ENDOSCOPY;  Service: Endoscopy;  Laterality: N/A;   EYE SURGERY     JOINT REPLACEMENT     THYROIDECTOMY, PARTIAL     unclear why   TOTAL KNEE ARTHROPLASTY Right 1999   US ECHOCARDIOGRAPHY  11/2007   nl LV fxn EF 73%, diastolic dysfunction, LVH, tr TR and MR, slcerotic aortic valve    Current Medications: Current Meds  Medication Sig   allopurinol (ZYLOPRIM) 100 MG tablet Take 1 tablet (100 mg total) by mouth daily.   amLODipine (NORVASC) 5 MG tablet Take 1 tablet (5 mg total) by mouth daily.   anastrozole (ARIMIDEX) 1 MG tablet Take 1 tablet (1 mg total) by mouth daily.   apixaban (ELIQUIS) 5 MG TABS tablet Take 1 tablet (5 mg total) by mouth 2 (two) times daily.   Blood Glucose Monitoring Suppl (ACURA BLOOD GLUCOSE METER)  W/DEVICE KIT Use as directed to check sugars daily 250.42   CALCIUM-MAGNESIUM PO Take 1 tablet by mouth daily.   carvedilol (COREG) 25 MG tablet Take 12.5 mg by mouth 2 (two) times daily with a meal.   Cholecalciferol (VITAMIN D-3) 5000 UNITS TABS Take 1 tablet by mouth daily.   HYDROcodone-acetaminophen (NORCO/VICODIN) 5-325 MG tablet Take 2 tablets by mouth every 6 (six) hours as needed for moderate pain.   isosorbide dinitrate (ISORDIL) 20 MG tablet Take 1 tablet (20 mg total) by mouth daily.   levothyroxine (SYNTHROID) 88 MCG tablet TAKE 1 TABLET BY MOUTH EVERY DAY   pantoprazole (PROTONIX) 40 MG tablet Take 1 tablet (40 mg total) by mouth daily as needed.   pentoxifylline (TRENTAL) 400 MG CR tablet Take 1 tablet (400 mg total) by mouth daily.  saxagliptin HCl (ONGLYZA) 2.5 MG TABS tablet Take 1 tablet (2.5 mg total) by mouth daily.     Allergies:   Tramadol, Codeine, and Sulfa antibiotics   Social History   Socioeconomic History   Marital status: Divorced    Spouse name: Not on file   Number of children: Not on file   Years of education: Not on file   Highest education level: Not on file  Occupational History   Not on file  Tobacco Use   Smoking status: Former   Smokeless tobacco: Never   Tobacco comments:    quit over 40 yrs ago  Vaping Use   Vaping Use: Never used  Substance and Sexual Activity   Alcohol use: Never    Comment: rare   Drug use: Never   Sexual activity: Not Currently  Other Topics Concern   Not on file  Social History Narrative   Lives alone, 1 dog.   Granddaughter in [place] (attorney).   Divorced   Occu: retired, worked in Engineer, technical sales   Edu: college   Social Determinants of Radio broadcast assistant Strain: [place]  (12/28/2021)   Overall Financial Resource Strain (CARDIA)    Difficulty of Paying Living Expenses: Not hard at all  Food Insecurity: No Food Insecurity (12/28/2021)   Hunger Vital Sign    Worried About Running Out of Food in the Last Year:  Never true    [place] in the Last Year: Never true  Transportation Needs: No Transportation Needs (12/28/2021)   PRAPARE - Hydrologist (Medical): No    Lack of Transportation (Non-Medical): No  Physical Activity: Inactive (12/28/2021)   Exercise Vital Sign    Days of Exercise per Week: 0 days    Minutes of Exercise per Session: 0 min  Stress: No Stress Concern Present (12/28/2021)   [place]    Feeling of Stress : Not at all  Social Connections: Not on file     Family History: The patient's family history includes Alcohol abuse in her father; Arthritis in her father and mother; Diabetes in her mother; Hypertension in her mother; Stroke in her daughter. There is no history of CAD, Cancer, or Breast cancer.  ROS:   Please see the history of present illness.     All other systems reviewed and are negative.  EKGs/Labs/Other Studies Reviewed:    The following studies were reviewed today:   EKG:  EKG is  ordered today.  The ekg ordered today demonstrates normal sinus rhythm, heart rate 70  Recent Labs: 11/15/2021: ALT 10; Magnesium 1.7; TSH 2.284 11/25/2021: Platelets 111.0 02/01/2022: BUN 45; Creatinine, Ser 5.70; Hemoglobin 9.5; Potassium 5.1; Sodium 142  Recent Lipid Panel    Component Value Date/Time   CHOL 177 11/16/2021 0504   TRIG 64 11/16/2021 0504   TRIG 97 12/04/2009 0000   HDL 60 11/16/2021 0504   CHOLHDL 3.0 11/16/2021 0504   VLDL 13 11/16/2021 0504   LDLCALC 104 (H) 11/16/2021 0504   LDLDIRECT 108 (H) 03/13/2014 1624     Risk Assessment/Calculations:          Physical Exam:    VS:  BP (!) 140/60 (BP Location: Left Arm, Patient Position: Sitting, Cuff Size: Normal)   Pulse 70   Ht _0  (1.6 m)   Wt 181 lb 8 oz (82.3 kg)   SpO2 96%   BMI 32.15 kg/m  Wt Readings from Last 3 Encounters:  02/24/22 181 lb 8 oz (82.3 kg)  02/20/22 181 lb 6.4 oz (82.3  kg)  02/08/22 181 lb (82.1 kg)     GEN:  Well nourished, well developed in no acute distress HEENT: Normal NECK: No JVD; No carotid bruits CARDIAC: RRR, faint systolic murmur RESPIRATORY:  Clear to auscultation without rales, wheezing or rhonchi  ABDOMEN: Soft, non-tender, non-distended MUSCULOSKELETAL:  No edema; No deformity  SKIN: Warm and dry NEUROLOGIC:  Alert and oriented x 3 PSYCHIATRIC:  Normal affect   ASSESSMENT:    1. Paroxysmal atrial fibrillation (HCC)   2. Primary hypertension   3. Nonrheumatic aortic valve stenosis     PLAN:    In order of problems listed above:  Paroxysmal atrial fibrillation, continue Coreg, Eliquis.  Denies palpitations, maintaining sinus rhythm. Hypertension, BP elevated, usually controlled, continue current medications Norvasc 5 mg daily,  Coreg.  Patient starting dialysis soon. Mild aortic valve stenosis, monitor serially with echocardiograms.  Follow-up 6 months.     Medication Adjustments/Labs and Tests Ordered: Current medicines are reviewed at length with the patient today.  Concerns regarding medicines are outlined above.  Orders Placed This Encounter  Procedures   EKG 12-Lead   No orders of the defined types were placed in this encounter.   Patient Instructions  Medication Instructions:  Your physician recommends that you continue on your current medications as directed. Please refer to the Current Medication list given to you today.  *If you need a refill on your cardiac medications before your next appointment, please call your pharmacy*   At [facility], you and your health needs are our priority.  As part of our continuing mission to provide you with exceptional heart care, we have created designated Provider Care Teams.  These Care Teams include your primary Cardiologist (physician) and Advanced Practice Providers (APPs -  Physician Assistants and Nurse Practitioners) who all work together to provide you with the  care you need, when you need it.  We recommend signing up for the patient portal called "MyChart".  Sign up information is provided on this After Visit Summary.  MyChart is used to connect with patients for Virtual Visits (Telemedicine).  Patients are able to view lab/test results, encounter notes, upcoming appointments, etc.  Non-urgent messages can be sent to your provider as well.   To learn more about what you can do with MyChart, go to [email or web address].    Your next appointment:   6 month(s)  The format for your next appointment:   In Person  Provider:   Kate Sable, MD    Other Instructions   Important Information About Sugar         Signed, Kate Sable, MD  02/24/2022 11:24 AM    [place]

## 2022-02-24 NOTE — Patient Instructions (Signed)
Medication Instructions:  Your physician recommends that you continue on your current medications as directed. Please refer to the Current Medication list given to you today.  *If you need a refill on your cardiac medications before your next appointment, please call your pharmacy*   At [facility], you and your health needs are our priority.  As part of our continuing mission to provide you with exceptional heart care, we have created designated Provider Care Teams.  These Care Teams include your primary Cardiologist (physician) and Advanced Practice Providers (APPs -  Physician Assistants and Nurse Practitioners) who all work together to provide you with the care you need, when you need it.  We recommend signing up for the patient portal called "MyChart".  Sign up information is provided on this After Visit Summary.  MyChart is used to connect with patients for Virtual Visits (Telemedicine).  Patients are able to view lab/test results, encounter notes, upcoming appointments, etc.  Non-urgent messages can be sent to your provider as well.   To learn more about what you can do with MyChart, go to [email or web address].    Your next appointment:   6 month(s)  The format for your next appointment:   In Person  Provider:   Kate Sable, MD    Other Instructions   Important Information About Sugar

## 2022-02-28 DIAGNOSIS — E113312 Type 2 diabetes mellitus with moderate nonproliferative diabetic retinopathy with macular edema, left eye: Secondary | ICD-10-CM | POA: Diagnosis not present

## 2022-03-01 DIAGNOSIS — N184 Chronic kidney disease, stage 4 (severe): Secondary | ICD-10-CM | POA: Diagnosis not present

## 2022-03-02 ENCOUNTER — Other Ambulatory Visit (INDEPENDENT_AMBULATORY_CARE_PROVIDER_SITE_OTHER): Payer: Self-pay | Admitting: Vascular Surgery

## 2022-03-02 DIAGNOSIS — Z95828 Presence of other vascular implants and grafts: Secondary | ICD-10-CM

## 2022-03-02 DIAGNOSIS — N186 End stage renal disease: Secondary | ICD-10-CM

## 2022-03-03 ENCOUNTER — Ambulatory Visit (INDEPENDENT_AMBULATORY_CARE_PROVIDER_SITE_OTHER): Payer: Medicare Other | Admitting: Nurse Practitioner

## 2022-03-03 ENCOUNTER — Encounter (INDEPENDENT_AMBULATORY_CARE_PROVIDER_SITE_OTHER): Payer: Medicare Other

## 2022-03-15 ENCOUNTER — Encounter (INDEPENDENT_AMBULATORY_CARE_PROVIDER_SITE_OTHER): Payer: Self-pay | Admitting: Nurse Practitioner

## 2022-03-15 ENCOUNTER — Ambulatory Visit (INDEPENDENT_AMBULATORY_CARE_PROVIDER_SITE_OTHER): Payer: Medicare Other | Admitting: Nurse Practitioner

## 2022-03-15 ENCOUNTER — Ambulatory Visit (INDEPENDENT_AMBULATORY_CARE_PROVIDER_SITE_OTHER): Payer: Medicare Other

## 2022-03-15 VITALS — BP 181/74 | HR 66 | Resp 16

## 2022-03-15 DIAGNOSIS — Z95828 Presence of other vascular implants and grafts: Secondary | ICD-10-CM

## 2022-03-15 DIAGNOSIS — E1169 Type 2 diabetes mellitus with other specified complication: Secondary | ICD-10-CM

## 2022-03-15 DIAGNOSIS — I10 Essential (primary) hypertension: Secondary | ICD-10-CM | POA: Diagnosis not present

## 2022-03-15 DIAGNOSIS — I129 Hypertensive chronic kidney disease with stage 1 through stage 4 chronic kidney disease, or unspecified chronic kidney disease: Secondary | ICD-10-CM | POA: Diagnosis not present

## 2022-03-15 DIAGNOSIS — N185 Chronic kidney disease, stage 5: Secondary | ICD-10-CM | POA: Diagnosis not present

## 2022-03-15 DIAGNOSIS — N186 End stage renal disease: Secondary | ICD-10-CM

## 2022-03-15 DIAGNOSIS — D631 Anemia in chronic kidney disease: Secondary | ICD-10-CM | POA: Diagnosis not present

## 2022-03-15 DIAGNOSIS — N2581 Secondary hyperparathyroidism of renal origin: Secondary | ICD-10-CM | POA: Diagnosis not present

## 2022-03-24 ENCOUNTER — Encounter (INDEPENDENT_AMBULATORY_CARE_PROVIDER_SITE_OTHER): Payer: Self-pay

## 2022-03-27 ENCOUNTER — Encounter (INDEPENDENT_AMBULATORY_CARE_PROVIDER_SITE_OTHER): Payer: Self-pay | Admitting: Nurse Practitioner

## 2022-03-27 NOTE — Progress Notes (Signed)
Subjective:    Patient ID: Alexis Tucker, female    DOB: 12/27/1940, 81 y.o.   MRN: [id number] Chief Complaint  Patient presents with   Follow-up    [facility] 3 week with HDA    Alexis Tucker is a 81 year old female who returns today for follow-up evaluation of her right brachial ax graft.  There is no numbness and the wound is healing well however there is a large hematoma at the antecubital fossa.  There is no bruising or bleeding of his area.  There is been no fevers or chills.  No signs symptoms of steal syndrome.  Today the patient has a flow volume of 492.  There is a large hematoma at the arterial anastomosis causing extrinsic compression of the AV graft.    Review of Systems  Cardiovascular:        Arm swelling  All other systems reviewed and are negative.      Objective:   Physical Exam Vitals reviewed.  HENT:     Head: Normocephalic.  Cardiovascular:     Rate and Rhythm: Normal rate.     Arteriovenous access: Right arteriovenous access is present.    Comments: Good thrill and bruit Pulmonary:     Effort: Pulmonary effort is normal.  Skin:    General: Skin is warm and dry.  Neurological:     Mental Status: She is alert and oriented to person, place, and time.  Psychiatric:        Mood and Affect: Mood normal.        Behavior: Behavior normal.        Thought Content: Thought content normal.        Judgment: Judgment normal.     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   a.) moderately differentiated IMC (G2, T1N0Mx); s/p lumpectomy + adjuvant XRT    Breast cancer, left ([place]) 11/27/2017   a.) [place] (mpT2 pN0, G2, ER/PR positive, HER-2/neu negative); b.) mammosite   Carotid stenosis, asymptomatic 01/10/2013   a.) carotid dopplers 01/10/2013, 02/27/2014, 09/10/2017, 07/26/2020: >50% BECA, 1-39% BICA   Cataracts, bilateral    Chronic diastolic CHF (congestive heart failure) ([place])    a.) TTE 11/26/2007: EF 60%, LVH, triv TR/MR, AoV sclerosis with no stenosis, G1DD; b.) TTE 05/04/2016: EF 60-65%, LAE, G1DD; c.) TTE 11/16/2021: EF 55-60%, LVH, RVE, mild-mod LAE, mod MAC, mod MR, mild TR/AR, G2DD.   Coronary artery calcification seen on CT scan    CVA (cerebral vascular accident) ([place])    a.) remotes infarct(s) noted on CT imaging from 08/17/2017 --> RIGHT parietotemporal infarct; lacunar infarct LEFT pons   Diabetes type 2, controlled ([place]) 2000s   Diabetic retinopathy with macular edema ([place]) 03/2014   a.) Avastin injections as [place]   DJD (degenerative joint disease)    ESRD (end stage renal disease) ([place])    GERD (gastroesophageal reflux disease)    Glaucoma 03/2014   History of colon cancer 2006   a.) s/p colectomy   History of  hepatitis A 1990s   from food, resolved   HTN (hypertension)    Hyperplastic colon polyp    Long term current use of anticoagulant    a.) apixaban   Long term current use of antithrombotics/antiplatelets    a.) pentoxifylline   Macular degeneration    wet R with vision loss, dry L; to see retina specialist   Neuropathy    OA (osteoarthritis)    Osteonecrosis (HCC)    hips?   PONV (postoperative nausea and vomiting)    Post-surgical hypothyroidism    Thrombocytopenia (HCC)    Tubular adenoma    Wears dentures    partial upper    Social History   Socioeconomic History   Marital status: Divorced    Spouse name: Not on file   Number of children: Not on file   Years of education: Not on file   Highest education level: Not on file  Occupational History   Not on file  Tobacco Use    Smoking status: Former   Smokeless tobacco: Never   Tobacco comments:    quit over 40 yrs ago  Vaping Use   Vaping Use: Never used  Substance and Sexual Activity   Alcohol use: Never    Comment: rare   Drug use: Never   Sexual activity: Not Currently  Other Topics Concern   Not on file  Social History Narrative   Lives alone, 1 dog.   Granddaughter in [place] (attorney).   Divorced   Occu: retired, worked in Engineer, technical sales   Edu: college   Social Determinants of Radio broadcast assistant Strain: [place]  (12/28/2021)   Overall Financial Resource Strain (CARDIA)    Difficulty of Paying Living Expenses: Not hard at all  Food Insecurity: No Food Insecurity (12/28/2021)   Hunger Vital Sign    Worried About Running Out of Food in the Last Year: Never true    [place] in the Last Year: Never true  Transportation Needs: No Transportation Needs (12/28/2021)   PRAPARE - Hydrologist (Medical): No    Lack of Transportation (Non-Medical): No  Physical Activity: Inactive (12/28/2021)   Exercise Vital Sign    Days of Exercise per Week: 0 days    Minutes of Exercise per Session: 0 min  Stress: No Stress Concern Present (12/28/2021)   [place]    Feeling of Stress : Not at all  Social Connections: Not on file  Intimate Partner Violence: Not on file    Past Surgical History:  Procedure Laterality Date   ABDOMINAL HYSTERECTOMY  1976   partial, after tubal pregnancy, h/o fibroids with heavy bleeding   AV FISTULA PLACEMENT Right 02/01/2022   Procedure: INSERTION OF ARTERIOVENOUS (AV) GORE-TEX GRAFT ARM (LEFT UPPER EXTREMITY);  Surgeon: Algernon Huxley, MD;  Location: [facility] ORS;  Service: Vascular;  Laterality: Right;   BREAST BIOPSY Left 2008   positive   BREAST BIOPSY Left 11/22/2017   2oclock 2cmfn wing shaped clip, INVASIVE MAMMARY CARCINOMA   BREAST BIOPSY Left 11/22/2017   2oclock 4cmfn coil shaped  clip, INVASIVE MAMMARY CARCINOMA   BREAST BIOPSY Left 11/22/2017   lymph node - butterfly hydroMARK   BREAST EXCISIONAL BIOPSY Left 01/07/2018   lumpectomy by Dr. Bary Castilla   BREAST LUMPECTOMY Left 2008   F/U radiation   BREAST LUMPECTOMY Left 2019   2 areas of [facility] at 2:00 position f/u with mammosite  BREAST LUMPECTOMY WITH SENTINEL LYMPH NODE BIOPSY Left 01/07/2018   Procedure: BREAST LUMPECTOMY WITH SENTINEL LYMPH NODE BX;  Surgeon: Robert Bellow, MD;  Location: [facility] ORS;  Service: General;  Laterality: Left;   BREAST MAMMOSITE  2008   carotid ultrasound  12/2012   1-39% bilateral stenosis, severe in ECA   CATARACT EXTRACTION W/PHACO Left 08/14/2016   Procedure: CATARACT EXTRACTION PHACO AND INTRAOCULAR LENS PLACEMENT ([place])  Left Diabetic;  Surgeon: Ronnell Freshwater, MD;  Location: [place];  Service: Ophthalmology;  Laterality: Left;  Diabetic - oral meds   CHOLECYSTECTOMY  2005   COLECTOMY  2005   colon cancer   COLON SURGERY  2006   bowel obstruction   COLONOSCOPY  2008   ext hem, ileo-colonic anastomosis in cecum, tubular adenoma x1, rec rpt 1 yr for multiple polyps (in [place])   COLONOSCOPY WITH PROPOFOL N/A 10/19/2021   2.5cm TVA with low grade dysplasia, diverticulosis, no rpt recommended Vicente Males, Bailey Mech, MD)   ESOPHAGOGASTRODUODENOSCOPY N/A 05/06/2016   Procedure: ESOPHAGOGASTRODUODENOSCOPY (EGD);  Surgeon: Manya Silvas, MD;  Location: [facility] ENDOSCOPY;  Service: Endoscopy;  Laterality: N/A;   EYE SURGERY     JOINT REPLACEMENT     THYROIDECTOMY, PARTIAL     unclear why   TOTAL KNEE ARTHROPLASTY Right 1999   US ECHOCARDIOGRAPHY  11/2007   nl LV fxn EF 30%, diastolic dysfunction, LVH, tr TR and MR, slcerotic aortic valve    Family History  Problem Relation Age of Onset   Diabetes Mother    Hypertension Mother    Arthritis Mother    Alcohol abuse Father    Arthritis Father    Stroke Daughter    CAD Neg Hx    Cancer Neg Hx    Breast cancer Neg Hx      Allergies  Allergen Reactions   Tramadol Other (See Comments)    nightmares   Codeine Anxiety   Sulfa Antibiotics Itching and Rash       Latest Ref Rng & Units 02/01/2022   12:18 PM 11/25/2021   12:17 PM 11/17/2021    2:03 AM  CBC  WBC 4.0 - 10.5 K/uL  5.2  6.7   Hemoglobin 12.0 - 15.0 g/dL 9.5  9.3  9.5   Hematocrit 36.0 - 46.0 % 28.0  28.4  30.4   Platelets 150.0 - 400.0 K/uL  111.0  106       CMP     Component Value Date/Time   NA 142 02/01/2022 1218   NA 140 10/16/2012 1227   K 5.1 02/01/2022 1218   K 4.3 02/26/2014 0000   CL 114 (H) 02/01/2022 1218   CL 106 10/16/2012 1227   CO2 22 11/25/2021 1217   CO2 24 10/16/2012 1227   GLUCOSE 118 (H) 02/01/2022 1218   GLUCOSE 262 (H) 10/16/2012 1227   BUN 45 (H) 02/01/2022 1218   BUN 22 (H) 10/16/2012 1227   CREATININE 5.70 (H) 02/01/2022 1218   CREATININE 4.05 02/26/2014 0000   CALCIUM 9.6 11/25/2021 1217   CALCIUM 9.0 10/16/2012 1227   PROT 7.5 11/15/2021 1722   PROT 7.9 10/16/2012 1227   ALBUMIN 3.8 11/25/2021 1217   ALBUMIN 3.9 10/16/2012 1227   AST 15 11/15/2021 1722   AST 19 10/16/2012 1227   AST 14 01/11/2012 0000   ALT 10 11/15/2021 1722   ALT 21 10/16/2012 1227   ALKPHOS 70 11/15/2021 1722   ALKPHOS 106 10/16/2012 1227   ALKPHOS 91 01/11/2012 0000  BILITOT 0.5 11/15/2021 1722   BILITOT 0.4 10/16/2012 1227   BILITOT 0.4 01/11/2012 0000   GFRNONAA 9 (L) 11/16/2021 0504   GFRAA 10 (L) 01/07/2018 0859     No results found.     Assessment & Plan:   1. ESRD (end stage renal disease) ([place]) Today the patient's new access has a large hematoma (5.2 x 4.1 cm) at the arterial anastomosis which may be affecting the patient's flow volume.  We will allow the patient some continued time to allow the hematoma to resolve naturally.  We will have the patient return in 4 weeks with an HDA.  2. Primary hypertension Continue antihypertensive medications as already ordered, these medications have been reviewed and  there are no changes at this time.   3. Type 2 diabetes mellitus with other specified complication, without long-term current use of insulin (HCC) Continue hypoglycemic medications as already ordered, these medications have been reviewed and there are no changes at this time.  Hgb A1C to be monitored as already arranged by primary service    Current Outpatient Medications on File Prior to Visit  Medication Sig Dispense Refill   allopurinol (ZYLOPRIM) 100 MG tablet Take 1 tablet (100 mg total) by mouth daily. 90 tablet 3   amLODipine (NORVASC) 5 MG tablet Take 1 tablet (5 mg total) by mouth daily. 30 tablet 3   anastrozole (ARIMIDEX) 1 MG tablet Take 1 tablet (1 mg total) by mouth daily. 90 tablet 1   apixaban (ELIQUIS) 5 MG TABS tablet Take 1 tablet (5 mg total) by mouth 2 (two) times daily. 60 tablet 6   Blood Glucose Monitoring Suppl (ACURA BLOOD GLUCOSE METER) W/DEVICE KIT Use as directed to check sugars daily 250.42 1 kit 0   CALCIUM-MAGNESIUM PO Take 1 tablet by mouth daily.     carvedilol (COREG) 25 MG tablet Take 12.5 mg by mouth 2 (two) times daily with a meal.     Cholecalciferol (VITAMIN D-3) 5000 UNITS TABS Take 1 tablet by mouth daily.     HYDROcodone-acetaminophen (NORCO/VICODIN) 5-325 MG tablet Take 2 tablets by mouth every 6 (six) hours as needed for moderate pain. 20 tablet 0   isosorbide dinitrate (ISORDIL) 20 MG tablet Take 1 tablet (20 mg total) by mouth daily. 90 tablet 3   levothyroxine (SYNTHROID) 88 MCG tablet TAKE 1 TABLET BY MOUTH EVERY DAY 90 tablet 3   pantoprazole (PROTONIX) 40 MG tablet Take 1 tablet (40 mg total) by mouth daily as needed. 90 tablet 1   pentoxifylline (TRENTAL) 400 MG CR tablet Take 1 tablet (400 mg total) by mouth daily. 90 tablet 3   saxagliptin HCl (ONGLYZA) 2.5 MG TABS tablet Take 1 tablet (2.5 mg total) by mouth daily. 30 tablet 11   No current facility-administered medications on file prior to visit.    There are no Patient Instructions  on file for this visit. No follow-ups on file.   Kris Hartmann, NP

## 2022-03-31 DIAGNOSIS — E113312 Type 2 diabetes mellitus with moderate nonproliferative diabetic retinopathy with macular edema, left eye: Secondary | ICD-10-CM | POA: Diagnosis not present

## 2022-04-12 ENCOUNTER — Other Ambulatory Visit (INDEPENDENT_AMBULATORY_CARE_PROVIDER_SITE_OTHER): Payer: Self-pay | Admitting: Nurse Practitioner

## 2022-04-12 DIAGNOSIS — N186 End stage renal disease: Secondary | ICD-10-CM

## 2022-04-12 DIAGNOSIS — T148XXA Other injury of unspecified body region, initial encounter: Secondary | ICD-10-CM

## 2022-04-14 ENCOUNTER — Encounter (INDEPENDENT_AMBULATORY_CARE_PROVIDER_SITE_OTHER): Payer: Self-pay | Admitting: Nurse Practitioner

## 2022-04-14 ENCOUNTER — Ambulatory Visit (INDEPENDENT_AMBULATORY_CARE_PROVIDER_SITE_OTHER): Payer: Medicare Other

## 2022-04-14 ENCOUNTER — Ambulatory Visit (INDEPENDENT_AMBULATORY_CARE_PROVIDER_SITE_OTHER): Payer: Medicare Other | Admitting: Nurse Practitioner

## 2022-04-14 DIAGNOSIS — T148XXA Other injury of unspecified body region, initial encounter: Secondary | ICD-10-CM

## 2022-04-14 DIAGNOSIS — N186 End stage renal disease: Secondary | ICD-10-CM

## 2022-04-15 ENCOUNTER — Encounter (INDEPENDENT_AMBULATORY_CARE_PROVIDER_SITE_OTHER): Payer: Self-pay | Admitting: Nurse Practitioner

## 2022-04-15 NOTE — Progress Notes (Signed)
Subjective:    Patient ID: Alexis Tucker, female    DOB: [birth year], 81 y.o.   MRN: [id number] Chief Complaint  Patient presents with   Routine Post Op    [place] 4 week follow up    Alexis Tucker is a 81 year old female who returns today for follow-up of her right AV graft placement and hematoma formation.  The patient notes that the hematoma has not decreased much in size.  It is not exquisitely painful for the patient.  The patient should begin dialysis however following discussion with the patient she does not wish to undergo dialysis.  She does not want to undergo hemodialysis or peritoneal dialysis.  Today the patient has a flow volume of 2521 and her graft.    Review of Systems  All other systems reviewed and are negative.      Objective:   Physical Exam Vitals reviewed.  HENT:     Head: Normocephalic.  Cardiovascular:     Rate and Rhythm: Normal rate.     Pulses: Normal pulses.  Pulmonary:     Effort: Pulmonary effort is normal.  Skin:    General: Skin is warm and dry.  Neurological:     Mental Status: She is alert and oriented to person, place, and time.  Psychiatric:        Mood and Affect: Mood normal.        Behavior: Behavior normal.        Thought Content: Thought content normal.        Judgment: Judgment normal.     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   a.) moderately differentiated [place] (G2, T1N0Mx); s/p lumpectomy + adjuvant XRT   Breast cancer, left ([place]) 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   Coronary artery calcification seen on CT scan    CVA (cerebral vascular accident) ([place])    a.) remotes infarct(s) noted on CT imaging from 08/17/2017 --> RIGHT parietotemporal infarct; lacunar infarct LEFT pons   Diabetes type 2, controlled ([place]) 2000s   Diabetic retinopathy with macular edema ([place]) 03/2014   a.) Avastin injections as [place]   DJD (degenerative joint disease)    ESRD (end stage renal disease) ([place])    GERD (gastroesophageal reflux disease)    Glaucoma 03/2014   History of colon cancer 2006   a.) s/p colectomy   History of hepatitis A 1990s   from food, resolved   HTN (hypertension)  Hyperplastic colon polyp    Long term current use of anticoagulant    a.) apixaban   Long term current use of antithrombotics/antiplatelets    a.) pentoxifylline   Macular degeneration    wet R with vision loss, dry L; to see retina specialist   Neuropathy    OA (osteoarthritis)    Osteonecrosis (HCC)    hips?   PONV (postoperative nausea and vomiting)    Post-surgical hypothyroidism    Thrombocytopenia (HCC)    Tubular adenoma    Wears dentures    partial upper    Social History   Socioeconomic History   Marital status: Divorced    Spouse name: Not on file   Number of children: Not on file   Years of education: Not on file   Highest education level: Not on file  Occupational History   Not on file  Tobacco Use   Smoking status: Former   Smokeless tobacco: Never    Tobacco comments:    quit over 40 yrs ago  Vaping Use   Vaping Use: Never used  Substance and Sexual Activity   Alcohol use: Never    Comment: rare   Drug use: Never   Sexual activity: Not Currently  Other Topics Concern   Not on file  Social History Narrative   Lives alone, 1 dog.   Granddaughter in [place] (attorney).   Divorced   Occu: retired, worked in Engineer, technical sales   Edu: college   Social Determinants of Radio broadcast assistant Strain: [place]  (12/28/2021)   Overall Financial Resource Strain (CARDIA)    Difficulty of Paying Living Expenses: Not hard at all  Food Insecurity: No Food Insecurity (12/28/2021)   Hunger Vital Sign    Worried About Running Out of Food in the Last Year: Never true    [place] in the Last Year: Never true  Transportation Needs: No Transportation Needs (12/28/2021)   PRAPARE - Hydrologist (Medical): No    Lack of Transportation (Non-Medical): No  Physical Activity: Inactive (12/28/2021)   Exercise Vital Sign    Days of Exercise per Week: 0 days    Minutes of Exercise per Session: 0 min  Stress: No Stress Concern Present (12/28/2021)   [place]    Feeling of Stress : Not at all  Social Connections: Not on file  Intimate Partner Violence: Not on file    Past Surgical History:  Procedure Laterality Date   ABDOMINAL HYSTERECTOMY  1976   partial, after tubal pregnancy, h/o fibroids with heavy bleeding   AV FISTULA PLACEMENT Right 02/01/2022   Procedure: INSERTION OF ARTERIOVENOUS (AV) GORE-TEX GRAFT ARM (LEFT UPPER EXTREMITY);  Surgeon: Algernon Huxley, MD;  Location: [facility] ORS;  Service: Vascular;  Laterality: Right;   BREAST BIOPSY Left 2008   positive   BREAST BIOPSY Left 11/22/2017   2oclock 2cmfn wing shaped clip, INVASIVE MAMMARY CARCINOMA   BREAST BIOPSY Left 11/22/2017   2oclock 4cmfn coil shaped clip, INVASIVE MAMMARY CARCINOMA   BREAST BIOPSY Left  11/22/2017   lymph node - butterfly hydroMARK   BREAST EXCISIONAL BIOPSY Left 01/07/2018   lumpectomy by Dr. Bary Castilla   BREAST LUMPECTOMY Left 2008   F/U radiation   BREAST LUMPECTOMY Left 2019   2 areas of [place] at 2:00 position f/u with mammosite   BREAST LUMPECTOMY WITH SENTINEL LYMPH NODE BIOPSY Left 01/07/2018   Procedure: BREAST LUMPECTOMY WITH  SENTINEL LYMPH NODE BX;  Surgeon: Robert Bellow, MD;  Location: [facility] ORS;  Service: General;  Laterality: Left;   BREAST MAMMOSITE  2008   carotid ultrasound  12/2012   1-39% bilateral stenosis, severe in ECA   CATARACT EXTRACTION W/PHACO Left 08/14/2016   Procedure: CATARACT EXTRACTION PHACO AND INTRAOCULAR LENS PLACEMENT ([place])  Left Diabetic;  Surgeon: Ronnell Freshwater, MD;  Location: [facility];  Service: Ophthalmology;  Laterality: Left;  Diabetic - oral meds   CHOLECYSTECTOMY  2005   COLECTOMY  2005   colon cancer   COLON SURGERY  2006   bowel obstruction   COLONOSCOPY  2008   ext hem, ileo-colonic anastomosis in cecum, tubular adenoma x1, rec rpt 1 yr for multiple polyps (in [place])   COLONOSCOPY WITH PROPOFOL N/A 10/19/2021   2.5cm TVA with low grade dysplasia, diverticulosis, no rpt recommended Vicente Males, Bailey Mech, MD)   ESOPHAGOGASTRODUODENOSCOPY N/A 05/06/2016   Procedure: ESOPHAGOGASTRODUODENOSCOPY (EGD);  Surgeon: Manya Silvas, MD;  Location: [facility] ENDOSCOPY;  Service: Endoscopy;  Laterality: N/A;   EYE SURGERY     JOINT REPLACEMENT     THYROIDECTOMY, PARTIAL     unclear why   TOTAL KNEE ARTHROPLASTY Right 1999   US ECHOCARDIOGRAPHY  11/2007   nl LV fxn EF 48%, diastolic dysfunction, LVH, tr TR and MR, slcerotic aortic valve    Family History  Problem Relation Age of Onset   Diabetes Mother    Hypertension Mother    Arthritis Mother    Alcohol abuse Father    Arthritis Father    Stroke Daughter    CAD Neg Hx    Cancer Neg Hx    Breast cancer Neg Hx     Allergies  Allergen Reactions   Tramadol Other  (See Comments)    nightmares   Codeine Anxiety   Sulfa Antibiotics Itching and Rash       Latest Ref Rng & Units 02/01/2022   12:18 PM 11/25/2021   12:17 PM 11/17/2021    2:03 AM  CBC  WBC 4.0 - 10.5 K/uL  5.2  6.7   Hemoglobin 12.0 - 15.0 g/dL 9.5  9.3  9.5   Hematocrit 36.0 - 46.0 % 28.0  28.4  30.4   Platelets 150.0 - 400.0 K/uL  111.0  106       CMP     Component Value Date/Time   NA 142 02/01/2022 1218   NA 140 10/16/2012 1227   K 5.1 02/01/2022 1218   K 4.3 02/26/2014 0000   CL 114 (H) 02/01/2022 1218   CL 106 10/16/2012 1227   CO2 22 11/25/2021 1217   CO2 24 10/16/2012 1227   GLUCOSE 118 (H) 02/01/2022 1218   GLUCOSE 262 (H) 10/16/2012 1227   BUN 45 (H) 02/01/2022 1218   BUN 22 (H) 10/16/2012 1227   CREATININE 5.70 (H) 02/01/2022 1218   CREATININE 4.05 02/26/2014 0000   CALCIUM 9.6 11/25/2021 1217   CALCIUM 9.0 10/16/2012 1227   PROT 7.5 11/15/2021 1722   PROT 7.9 10/16/2012 1227   ALBUMIN 3.8 11/25/2021 1217   ALBUMIN 3.9 10/16/2012 1227   AST 15 11/15/2021 1722   AST 19 10/16/2012 1227   AST 14 01/11/2012 0000   ALT 10 11/15/2021 1722   ALT 21 10/16/2012 1227   ALKPHOS 70 11/15/2021 1722   ALKPHOS 106 10/16/2012 1227   ALKPHOS 91 01/11/2012 0000   BILITOT 0.5 11/15/2021 1722   BILITOT 0.4 10/16/2012 1227   BILITOT 0.4  01/11/2012 0000   GFRNONAA 9 (L) 11/16/2021 0504   GFRAA 10 (L) 01/07/2018 0859     No results found.     Assessment & Plan:   1. ESRD (end stage renal disease) ([place]) Following a long discussion with the patient and her daughter, the patient ultimately does not wish to do dialysis in any form.  We discussed her options including leaving the graft in place which would likely thrombosed without use.  We discussed ligating the access.  Lastly we discussed removal of the graft.  Following discussion of all the benefits risk and alternatives of each of these options the patient elects to have the graft removed.  The risk benefits and  alternatives of this were discussed.  She is agreeable to proceed. - VAS US DUPLEX DIALYSIS ACCESS (AVF, AVG)  2. Hematoma This can be resected concurrently with the graft - VAS US DUPLEX DIALYSIS ACCESS (AVF, AVG)   Current Outpatient Medications on File Prior to Visit  Medication Sig Dispense Refill   allopurinol (ZYLOPRIM) 100 MG tablet Take 1 tablet (100 mg total) by mouth daily. 90 tablet 3   amLODipine (NORVASC) 5 MG tablet Take 1 tablet (5 mg total) by mouth daily. 30 tablet 3   anastrozole (ARIMIDEX) 1 MG tablet Take 1 tablet (1 mg total) by mouth daily. 90 tablet 1   apixaban (ELIQUIS) 5 MG TABS tablet Take 1 tablet (5 mg total) by mouth 2 (two) times daily. 60 tablet 6   Blood Glucose Monitoring Suppl (ACURA BLOOD GLUCOSE METER) W/DEVICE KIT Use as directed to check sugars daily 250.42 1 kit 0   CALCIUM-MAGNESIUM PO Take 1 tablet by mouth daily.     carvedilol (COREG) 25 MG tablet Take 12.5 mg by mouth 2 (two) times daily with a meal.     Cholecalciferol (VITAMIN D-3) 5000 UNITS TABS Take 1 tablet by mouth daily.     HYDROcodone-acetaminophen (NORCO/VICODIN) 5-325 MG tablet Take 2 tablets by mouth every 6 (six) hours as needed for moderate pain. 20 tablet 0   isosorbide dinitrate (ISORDIL) 20 MG tablet Take 1 tablet (20 mg total) by mouth daily. 90 tablet 3   levothyroxine (SYNTHROID) 88 MCG tablet TAKE 1 TABLET BY MOUTH EVERY DAY 90 tablet 3   pantoprazole (PROTONIX) 40 MG tablet Take 1 tablet (40 mg total) by mouth daily as needed. 90 tablet 1   pentoxifylline (TRENTAL) 400 MG CR tablet Take 1 tablet (400 mg total) by mouth daily. 90 tablet 3   saxagliptin HCl (ONGLYZA) 2.5 MG TABS tablet Take 1 tablet (2.5 mg total) by mouth daily. 30 tablet 11   No current facility-administered medications on file prior to visit.    There are no Patient Instructions on file for this visit. No follow-ups on file.   Kris Hartmann, NP

## 2022-04-19 ENCOUNTER — Telehealth (INDEPENDENT_AMBULATORY_CARE_PROVIDER_SITE_OTHER): Payer: Self-pay | Admitting: Nurse Practitioner

## 2022-04-19 NOTE — Telephone Encounter (Signed)
Patient's daughter called wanting to know when will her mother be scheduled to have her port removed.  Please advise.

## 2022-04-20 ENCOUNTER — Encounter (INDEPENDENT_AMBULATORY_CARE_PROVIDER_SITE_OTHER): Payer: Self-pay

## 2022-04-21 ENCOUNTER — Telehealth (INDEPENDENT_AMBULATORY_CARE_PROVIDER_SITE_OTHER): Payer: Self-pay

## 2022-04-21 NOTE — Telephone Encounter (Signed)
I attempted to contact the patient and daughter and per the phone number to call I was unable to leave a message.

## 2022-04-21 NOTE — Telephone Encounter (Signed)
I attempted to contact patient and daughter and was unable to leave a message as the phone would not receive a message.

## 2022-04-22 ENCOUNTER — Inpatient Hospital Stay
Admission: EM | Admit: 2022-04-22 | Discharge: 2022-04-25 | DRG: 189 | Disposition: A | Discharge status: Hospice | Payer: Medicare Other | Attending: Internal Medicine | Admitting: Internal Medicine

## 2022-04-22 ENCOUNTER — Emergency Department: Payer: Medicare Other

## 2022-04-22 ENCOUNTER — Other Ambulatory Visit: Payer: Self-pay

## 2022-04-22 DIAGNOSIS — Z9841 Cataract extraction status, right eye: Secondary | ICD-10-CM

## 2022-04-22 DIAGNOSIS — E1122 Type 2 diabetes mellitus with diabetic chronic kidney disease: Secondary | ICD-10-CM | POA: Diagnosis not present

## 2022-04-22 DIAGNOSIS — E1140 Type 2 diabetes mellitus with diabetic neuropathy, unspecified: Secondary | ICD-10-CM | POA: Diagnosis present

## 2022-04-22 DIAGNOSIS — I2489 Other forms of acute ischemic heart disease: Secondary | ICD-10-CM | POA: Diagnosis not present

## 2022-04-22 DIAGNOSIS — E1169 Type 2 diabetes mellitus with other specified complication: Secondary | ICD-10-CM | POA: Diagnosis present

## 2022-04-22 DIAGNOSIS — Z833 Family history of diabetes mellitus: Secondary | ICD-10-CM

## 2022-04-22 DIAGNOSIS — N185 Chronic kidney disease, stage 5: Secondary | ICD-10-CM | POA: Diagnosis present

## 2022-04-22 DIAGNOSIS — Z87891 Personal history of nicotine dependence: Secondary | ICD-10-CM | POA: Diagnosis not present

## 2022-04-22 DIAGNOSIS — G4733 Obstructive sleep apnea (adult) (pediatric): Secondary | ICD-10-CM | POA: Diagnosis present

## 2022-04-22 DIAGNOSIS — E8770 Fluid overload, unspecified: Secondary | ICD-10-CM

## 2022-04-22 DIAGNOSIS — K219 Gastro-esophageal reflux disease without esophagitis: Secondary | ICD-10-CM | POA: Diagnosis not present

## 2022-04-22 DIAGNOSIS — I2510 Atherosclerotic heart disease of native coronary artery without angina pectoris: Secondary | ICD-10-CM | POA: Diagnosis present

## 2022-04-22 DIAGNOSIS — E611 Iron deficiency: Secondary | ICD-10-CM | POA: Diagnosis present

## 2022-04-22 DIAGNOSIS — Z7984 Long term (current) use of oral hypoglycemic drugs: Secondary | ICD-10-CM

## 2022-04-22 DIAGNOSIS — Z515 Encounter for palliative care: Secondary | ICD-10-CM | POA: Diagnosis not present

## 2022-04-22 DIAGNOSIS — D696 Thrombocytopenia, unspecified: Secondary | ICD-10-CM | POA: Diagnosis not present

## 2022-04-22 DIAGNOSIS — Z7901 Long term (current) use of anticoagulants: Secondary | ICD-10-CM

## 2022-04-22 DIAGNOSIS — N186 End stage renal disease: Secondary | ICD-10-CM | POA: Diagnosis not present

## 2022-04-22 DIAGNOSIS — Z853 Personal history of malignant neoplasm of breast: Secondary | ICD-10-CM

## 2022-04-22 DIAGNOSIS — R0902 Hypoxemia: Secondary | ICD-10-CM

## 2022-04-22 DIAGNOSIS — N179 Acute kidney failure, unspecified: Secondary | ICD-10-CM | POA: Diagnosis present

## 2022-04-22 DIAGNOSIS — M1990 Unspecified osteoarthritis, unspecified site: Secondary | ICD-10-CM | POA: Diagnosis not present

## 2022-04-22 DIAGNOSIS — Z66 Do not resuscitate: Secondary | ICD-10-CM | POA: Diagnosis present

## 2022-04-22 DIAGNOSIS — E890 Postprocedural hypothyroidism: Secondary | ICD-10-CM | POA: Diagnosis present

## 2022-04-22 DIAGNOSIS — E11311 Type 2 diabetes mellitus with unspecified diabetic retinopathy with macular edema: Secondary | ICD-10-CM | POA: Diagnosis present

## 2022-04-22 DIAGNOSIS — Y832 Surgical operation with anastomosis, bypass or graft as the cause of abnormal reaction of the patient, or of later complication, without mention of misadventure at the time of the procedure: Secondary | ICD-10-CM | POA: Diagnosis present

## 2022-04-22 DIAGNOSIS — R7989 Other specified abnormal findings of blood chemistry: Secondary | ICD-10-CM | POA: Diagnosis present

## 2022-04-22 DIAGNOSIS — I5033 Acute on chronic diastolic (congestive) heart failure: Secondary | ICD-10-CM | POA: Diagnosis present

## 2022-04-22 DIAGNOSIS — Z96651 Presence of right artificial knee joint: Secondary | ICD-10-CM | POA: Diagnosis present

## 2022-04-22 DIAGNOSIS — Z170 Estrogen receptor positive status [ER+]: Secondary | ICD-10-CM | POA: Diagnosis not present

## 2022-04-22 DIAGNOSIS — Z79890 Hormone replacement therapy (postmenopausal): Secondary | ICD-10-CM | POA: Diagnosis not present

## 2022-04-22 DIAGNOSIS — I480 Paroxysmal atrial fibrillation: Secondary | ICD-10-CM | POA: Diagnosis not present

## 2022-04-22 DIAGNOSIS — T82838A Hemorrhage of vascular prosthetic devices, implants and grafts, initial encounter: Secondary | ICD-10-CM | POA: Diagnosis not present

## 2022-04-22 DIAGNOSIS — J189 Pneumonia, unspecified organism: Secondary | ICD-10-CM | POA: Diagnosis present

## 2022-04-22 DIAGNOSIS — N2581 Secondary hyperparathyroidism of renal origin: Secondary | ICD-10-CM | POA: Diagnosis not present

## 2022-04-22 DIAGNOSIS — Z8261 Family history of arthritis: Secondary | ICD-10-CM

## 2022-04-22 DIAGNOSIS — R609 Edema, unspecified: Secondary | ICD-10-CM | POA: Diagnosis not present

## 2022-04-22 DIAGNOSIS — H409 Unspecified glaucoma: Secondary | ICD-10-CM | POA: Diagnosis present

## 2022-04-22 DIAGNOSIS — I10 Essential (primary) hypertension: Secondary | ICD-10-CM | POA: Diagnosis present

## 2022-04-22 DIAGNOSIS — M549 Dorsalgia, unspecified: Secondary | ICD-10-CM | POA: Diagnosis present

## 2022-04-22 DIAGNOSIS — Z95828 Presence of other vascular implants and grafts: Secondary | ICD-10-CM | POA: Diagnosis not present

## 2022-04-22 DIAGNOSIS — R200 Anesthesia of skin: Secondary | ICD-10-CM | POA: Diagnosis present

## 2022-04-22 DIAGNOSIS — T82898A Other specified complication of vascular prosthetic devices, implants and grafts, initial encounter: Secondary | ICD-10-CM | POA: Diagnosis not present

## 2022-04-22 DIAGNOSIS — Z1152 Encounter for screening for COVID-19: Secondary | ICD-10-CM

## 2022-04-22 DIAGNOSIS — C50412 Malignant neoplasm of upper-outer quadrant of left female breast: Secondary | ICD-10-CM | POA: Diagnosis not present

## 2022-04-22 DIAGNOSIS — J9601 Acute respiratory failure with hypoxia: Principal | ICD-10-CM | POA: Diagnosis present

## 2022-04-22 DIAGNOSIS — R202 Paresthesia of skin: Secondary | ICD-10-CM | POA: Diagnosis present

## 2022-04-22 DIAGNOSIS — Z811 Family history of alcohol abuse and dependence: Secondary | ICD-10-CM

## 2022-04-22 DIAGNOSIS — R0602 Shortness of breath: Secondary | ICD-10-CM | POA: Diagnosis not present

## 2022-04-22 DIAGNOSIS — H3530 Unspecified macular degeneration: Secondary | ICD-10-CM | POA: Diagnosis present

## 2022-04-22 DIAGNOSIS — Z8249 Family history of ischemic heart disease and other diseases of the circulatory system: Secondary | ICD-10-CM

## 2022-04-22 DIAGNOSIS — I482 Chronic atrial fibrillation, unspecified: Secondary | ICD-10-CM | POA: Diagnosis present

## 2022-04-22 DIAGNOSIS — Z885 Allergy status to narcotic agent status: Secondary | ICD-10-CM

## 2022-04-22 DIAGNOSIS — Z9049 Acquired absence of other specified parts of digestive tract: Secondary | ICD-10-CM

## 2022-04-22 DIAGNOSIS — Z823 Family history of stroke: Secondary | ICD-10-CM

## 2022-04-22 DIAGNOSIS — Z8719 Personal history of other diseases of the digestive system: Secondary | ICD-10-CM

## 2022-04-22 DIAGNOSIS — G8929 Other chronic pain: Secondary | ICD-10-CM | POA: Diagnosis present

## 2022-04-22 DIAGNOSIS — Z8673 Personal history of transient ischemic attack (TIA), and cerebral infarction without residual deficits: Secondary | ICD-10-CM

## 2022-04-22 DIAGNOSIS — I132 Hypertensive heart and chronic kidney disease with heart failure and with stage 5 chronic kidney disease, or end stage renal disease: Secondary | ICD-10-CM | POA: Diagnosis not present

## 2022-04-22 DIAGNOSIS — D631 Anemia in chronic kidney disease: Secondary | ICD-10-CM | POA: Diagnosis present

## 2022-04-22 DIAGNOSIS — D649 Anemia, unspecified: Secondary | ICD-10-CM | POA: Diagnosis not present

## 2022-04-22 DIAGNOSIS — Z961 Presence of intraocular lens: Secondary | ICD-10-CM | POA: Diagnosis present

## 2022-04-22 DIAGNOSIS — Z7902 Long term (current) use of antithrombotics/antiplatelets: Secondary | ICD-10-CM

## 2022-04-22 DIAGNOSIS — Z9842 Cataract extraction status, left eye: Secondary | ICD-10-CM

## 2022-04-22 DIAGNOSIS — Z79899 Other long term (current) drug therapy: Secondary | ICD-10-CM

## 2022-04-22 DIAGNOSIS — Z882 Allergy status to sulfonamides status: Secondary | ICD-10-CM

## 2022-04-22 DIAGNOSIS — Z90710 Acquired absence of both cervix and uterus: Secondary | ICD-10-CM

## 2022-04-22 DIAGNOSIS — Z85038 Personal history of other malignant neoplasm of large intestine: Secondary | ICD-10-CM

## 2022-04-22 DIAGNOSIS — T829XXA Unspecified complication of cardiac and vascular prosthetic device, implant and graft, initial encounter: Secondary | ICD-10-CM | POA: Diagnosis not present

## 2022-04-22 LAB — TROPONIN I (HIGH SENSITIVITY)
Troponin I (High Sensitivity): 54 ng/L — ABNORMAL HIGH (ref <18)
Troponin I (High Sensitivity): 58 ng/L — ABNORMAL HIGH (ref <18)

## 2022-04-22 LAB — BASIC METABOLIC PANEL
Anion gap: 11 (ref 5–15)
BUN: 36 mg/dL — ABNORMAL HIGH (ref 8–23)
CO2: 22 mmol/L (ref 22–32)
Calcium: 9.3 mg/dL (ref 8.9–10.3)
Chloride: 108 mmol/L (ref 98–111)
Creatinine, Ser: 5.03 mg/dL — ABNORMAL HIGH (ref 0.44–1.00)
GFR, Estimated: 8 mL/min — ABNORMAL LOW (ref >60)
Glucose, Bld: 149 mg/dL — ABNORMAL HIGH (ref 70–99)
Potassium: 4.1 mmol/L (ref 3.5–5.1)
Sodium: 141 mmol/L (ref 135–145)

## 2022-04-22 LAB — CBC WITH DIFFERENTIAL/PLATELET
Abs Immature Granulocytes: 0.05 10*3/uL (ref 0.00–0.07)
Basophils Absolute: 0.1 10*3/uL (ref 0.0–0.1)
Basophils Relative: 1 %
Eosinophils Absolute: 0 10*3/uL (ref 0.0–0.5)
Eosinophils Relative: 0 %
HCT: 29.2 % — ABNORMAL LOW (ref 36.0–46.0)
Hemoglobin: 8.7 g/dL — ABNORMAL LOW (ref 12.0–15.0)
Immature Granulocytes: 1 %
Lymphocytes Relative: 10 %
Lymphs Abs: 0.9 10*3/uL (ref 0.7–4.0)
MCH: 28.8 pg (ref 26.0–34.0)
MCHC: 29.8 g/dL — ABNORMAL LOW (ref 30.0–36.0)
MCV: 96.7 fL (ref 80.0–100.0)
Monocytes Absolute: 1 10*3/uL (ref 0.1–1.0)
Monocytes Relative: 11 %
Neutro Abs: 7 10*3/uL (ref 1.7–7.7)
Neutrophils Relative %: 77 %
Platelets: 112 10*3/uL — ABNORMAL LOW (ref 150–400)
RBC: 3.02 MIL/uL — ABNORMAL LOW (ref 3.87–5.11)
RDW: 14.5 % (ref 11.5–15.5)
WBC: 9 10*3/uL (ref 4.0–10.5)
nRBC: 0 % (ref 0.0–0.2)

## 2022-04-22 LAB — SARS CORONAVIRUS 2 BY RT PCR: SARS Coronavirus 2 by RT PCR: NEGATIVE

## 2022-04-22 LAB — BRAIN NATRIURETIC PEPTIDE: B Natriuretic Peptide: 690.4 pg/mL — ABNORMAL HIGH (ref 0.0–100.0)

## 2022-04-22 LAB — CBG MONITORING, ED: Glucose-Capillary: 151 mg/dL — ABNORMAL HIGH (ref 70–99)

## 2022-04-22 MED ORDER — ISOSORBIDE DINITRATE 20 MG PO TABS
20.0000 mg | ORAL_TABLET | Freq: Every day | ORAL | Status: DC
  Administered 2022-04-22 – 2022-04-25 (×4): 20 mg via ORAL
  Filled 2022-04-22 (×4): qty 1

## 2022-04-22 MED ORDER — HYDRALAZINE HCL 20 MG/ML IJ SOLN: 5.0000 mg | Freq: Three times a day (TID) | INTRAMUSCULAR | Status: DC | PRN

## 2022-04-22 MED ORDER — SENNOSIDES-DOCUSATE SODIUM 8.6-50 MG PO TABS: 1.0000 | ORAL_TABLET | Freq: Every evening | ORAL | Status: DC | PRN

## 2022-04-22 MED ORDER — PANTOPRAZOLE SODIUM 40 MG PO TBEC: 40.0000 mg | DELAYED_RELEASE_TABLET | Freq: Every day | ORAL | Status: DC | PRN

## 2022-04-22 MED ORDER — HEPARIN SODIUM (PORCINE) 5000 UNIT/ML IJ SOLN
5000.0000 [IU] | Freq: Three times a day (TID) | INTRAMUSCULAR | Status: DC
  Administered 2022-04-22 – 2022-04-25 (×7): 5000 [IU] via SUBCUTANEOUS
  Filled 2022-04-22 (×7): qty 1

## 2022-04-22 MED ORDER — PENTOXIFYLLINE ER 400 MG PO TBCR
400.0000 mg | EXTENDED_RELEASE_TABLET | Freq: Every day | ORAL | Status: DC
  Administered 2022-04-22 – 2022-04-25 (×4): 400 mg via ORAL
  Filled 2022-04-22 (×4): qty 1

## 2022-04-22 MED ORDER — LEVOTHYROXINE SODIUM 88 MCG PO TABS
88.0000 ug | ORAL_TABLET | Freq: Every day | ORAL | Status: DC
  Administered 2022-04-23 – 2022-04-25 (×2): 88 ug via ORAL
  Filled 2022-04-22 (×3): qty 1

## 2022-04-22 MED ORDER — ANASTROZOLE 1 MG PO TABS
1.0000 mg | ORAL_TABLET | Freq: Every day | ORAL | Status: DC
  Administered 2022-04-23 – 2022-04-25 (×3): 1 mg via ORAL
  Filled 2022-04-22 (×3): qty 1

## 2022-04-22 MED ORDER — INSULIN ASPART 100 UNIT/ML IJ SOLN
0.0000 [IU] | Freq: Three times a day (TID) | INTRAMUSCULAR | Status: DC
  Filled 2022-04-22: qty 1

## 2022-04-22 MED ORDER — AMLODIPINE BESYLATE 5 MG PO TABS
5.0000 mg | ORAL_TABLET | Freq: Every day | ORAL | Status: DC
  Administered 2022-04-22: 5 mg via ORAL
  Filled 2022-04-22: qty 1

## 2022-04-22 MED ORDER — ACETAMINOPHEN 325 MG PO TABS
650.0000 mg | ORAL_TABLET | Freq: Four times a day (QID) | ORAL | Status: DC | PRN
  Administered 2022-04-23: 650 mg via ORAL
  Filled 2022-04-22: qty 2

## 2022-04-22 MED ORDER — FUROSEMIDE 10 MG/ML IJ SOLN: 40.0000 mg | Freq: Two times a day (BID) | INTRAMUSCULAR | Status: DC

## 2022-04-22 MED ORDER — ACETAMINOPHEN 650 MG RE SUPP: 650.0000 mg | Freq: Four times a day (QID) | RECTAL | Status: DC | PRN

## 2022-04-22 MED ORDER — CARVEDILOL 12.5 MG PO TABS
12.5000 mg | ORAL_TABLET | Freq: Two times a day (BID) | ORAL | Status: DC
  Administered 2022-04-23 – 2022-04-25 (×5): 12.5 mg via ORAL
  Filled 2022-04-22: qty 1
  Filled 2022-04-22: qty 2
  Filled 2022-04-22: qty 1
  Filled 2022-04-22: qty 2
  Filled 2022-04-22: qty 1

## 2022-04-22 MED ORDER — ALLOPURINOL 100 MG PO TABS
100.0000 mg | ORAL_TABLET | Freq: Every day | ORAL | Status: DC
  Administered 2022-04-22 – 2022-04-25 (×4): 100 mg via ORAL
  Filled 2022-04-22 (×5): qty 1

## 2022-04-22 MED ORDER — ONDANSETRON HCL 4 MG PO TABS: 4.0000 mg | ORAL_TABLET | Freq: Four times a day (QID) | ORAL | Status: DC | PRN

## 2022-04-22 MED ORDER — ONDANSETRON HCL 4 MG/2ML IJ SOLN: 4.0000 mg | Freq: Four times a day (QID) | INTRAMUSCULAR | Status: DC | PRN

## 2022-04-22 MED ORDER — LINAGLIPTIN 5 MG PO TABS
5.0000 mg | ORAL_TABLET | Freq: Every day | ORAL | Status: DC
  Administered 2022-04-22 – 2022-04-24 (×3): 5 mg via ORAL
  Filled 2022-04-22 (×4): qty 1

## 2022-04-22 MED ORDER — FUROSEMIDE 10 MG/ML IJ SOLN
60.0000 mg | Freq: Once | INTRAMUSCULAR | Status: AC
  Administered 2022-04-22: 60 mg via INTRAVENOUS
  Filled 2022-04-22: qty 8

## 2022-04-22 MED ORDER — INSULIN ASPART 100 UNIT/ML IJ SOLN: 0.0000 [IU] | Freq: Every day | INTRAMUSCULAR | Status: DC

## 2022-04-22 NOTE — Hospital Course (Signed)
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  Initial vitals in the emergency department showed temperature of 98.9, respiration rate of 18, heart rate 76, blood pressure 158/80, SPO2 of 95% on 2 L nasal cannula.  Serum sodium is 141, potassium 4.1, chloride 108, bicarb 22, BUN of 36, serum creatinine of 5.06, GFR of 8, nonfasting blood glucose 149, WBC 9, hemoglobin 8.7, platelets of 112.  BNP was 690.4.  High sensitive troponin was 54 and increased to 58 on recheck.  Neurology was consulted by emergency medicine provider.  ED treatment: Furosemide 60 mg IV one-time dose.

## 2022-04-22 NOTE — Assessment & Plan Note (Signed)
-   CBC in the a.m. 

## 2022-04-22 NOTE — ED Provider Notes (Signed)
[facility] Provider Note    Event Date/Time   First MD Initiated Contact with Patient 04/22/22 1513     (approximate)   History   low oxygen and Leg Swelling   HPI Alexis Tucker is a 81 y.o. female  who presents to the emergency department today because of concern for low oxygen level. Family (who is present at bedside) stated that they have been checking her vital signs more frequently recently because she has chosen not to have dialysis. Family felt that the patient has been breathing heavier recently. O2 sat was 90 yesterday and today was in the 70s. The patient herself has not felt short of breath, and has not appreciated that she had been breathing heavier than normal.  She denies any chest pain or cough.     Physical Exam   Triage Vital Signs: ED Triage Vitals  Enc Vitals Group     BP 04/22/22 1153 (!) 158/80     Pulse Rate 04/22/22 1153 81     Resp 04/22/22 1153 16     Temp 04/22/22 1153 99.2 F (37.3 C)     Temp Source 04/22/22 1153 Oral     SpO2 04/22/22 1153 (!) 84 %     Weight 04/22/22 1156 185 lb (83.9 kg)     Height 04/22/22 1156 '5\' 3"'$  (1.6 m)     Head Circumference --      Peak Flow --      Pain Score 04/22/22 1156 0     Pain Loc --      Pain Edu? --      Excl. in [place]? --     Most recent vital signs: Vitals:   04/22/22 1156 04/22/22 1503  BP:  (!) 150/61  Pulse:  76  Resp:  18  Temp:  98.9 F (37.2 C)  SpO2: 95% 94%   General: Awake, alert, oriented. CV:  Good peripheral perfusion. Regular rate and rhythm. Resp:  Normal effort. Lungs clear. Abd:  No distention.  Other:  Bilateral lower extremity edema.    ED Results / Procedures / Treatments   Labs (all labs ordered are listed, but only abnormal results are displayed) Labs Reviewed  BRAIN NATRIURETIC PEPTIDE - Abnormal; Notable for the following components:      Result Value   B Natriuretic Peptide 690.4 (*)    All other components within normal limits  BASIC  METABOLIC PANEL - Abnormal; Notable for the following components:   Glucose, Bld 149 (*)    BUN 36 (*)    Creatinine, Ser 5.03 (*)    GFR, Estimated 8 (*)    All other components within normal limits  CBC WITH DIFFERENTIAL/PLATELET - Abnormal; Notable for the following components:   RBC 3.02 (*)    Hemoglobin 8.7 (*)    HCT 29.2 (*)    MCHC 29.8 (*)    Platelets 112 (*)    All other components within normal limits  TROPONIN I (HIGH SENSITIVITY) - Abnormal; Notable for the following components:   Troponin I (High Sensitivity) 54 (*)    All other components within normal limits  TROPONIN I (HIGH SENSITIVITY)       RADIOLOGY I independently interpreted and visualized the CXR. My interpretation: Pulmonary edema Radiology interpretation:  IMPRESSION:  1. Increased pulmonary vascular and interstitial prominence,  suggestive of pulmonary edema.  2. Lower lung consolidation best appreciated on the lateral view,  atelectasis versus infection.      PROCEDURES:  Critical Care performed: No  Procedures   MEDICATIONS ORDERED IN ED: Medications - No data to display   IMPRESSION / MDM / [place] / ED COURSE  I reviewed the triage vital signs and the nursing notes.                              Differential diagnosis includes, but is not limited to, pneumonia, fluid overload.  Patient's presentation is most consistent with acute presentation with potential threat to life or bodily function.  Patient presents to the emergency department today because of concerns for shortness of breath and possible fluid overload.  Patient does have history of end-stage renal disease but apparently does not want dialysis.  Work-up here is consistent with fluid overload with chest x-ray concerning for peripheral edema.  Will start IV Lasix.  Discussed with Dr. Tobie Poet with the hospitalist service who will plan on admission.   FINAL CLINICAL IMPRESSION(S) / ED DIAGNOSES   Final  diagnoses:  Shortness of breath  Hypoxia  Hypervolemia, unspecified hypervolemia type      Note:  This document was prepared using Dragon voice recognition software and may include unintentional dictation errors.    Nance Pear, MD 04/22/22 [phone]

## 2022-04-22 NOTE — H&P (Signed)
History and Physical   Vaidehi Braddy BSW:[id number] DOB: 1941-04-07 DOA: 04/22/2022  PCP: Ria Bush, MD  Outpatient Specialists: Dr. Rogue Bussing, medical oncology; Dr. Garen Lah [facility] cardiology Patient coming from: Home via [place]  I have personally briefly reviewed patient's old medical records in [place].  Chief Concern: Leg swelling  HPI: 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  Initial vitals in the emergency department showed temperature of 98.9, respiration rate of 18, heart rate 76, blood pressure 158/80, SPO2 of 95% on 2 L nasal cannula.  Serum sodium is 141, potassium 4.1, chloride 108, bicarb 22, BUN of 36, serum creatinine of 5.06, GFR of 8, nonfasting blood glucose 149, WBC 9, hemoglobin 8.7, platelets of 112.  BNP was 690.4.  High sensitive troponin was 54 and increased to 58 on recheck.  Neurology was consulted by emergency medicine provider.  ED treatment: Furosemide 60 mg IV one-time dose. ------------------------ At bedside, she is able to tell me her name, age, current calendar year, current location.  She is moving around in bed a lot stating that his bed is uncomfortable.  She endorses shortness of breath that she noticed on day of presentation.  She also endorses bilateral lower extremity swelling and was not able to tell me how long this has been going on.  Her daughter states that she noticed the leg swelling about 2 days ago.  Patient and daughter does not know if there has been increased  weight.  Patient denies chest pain, abdominal pain, dysuria, hematuria, diarrhea, trauma to her person, syncope, dysphagia, vision changes, nausea, vomiting at this time.  Her daughter endorses that she was nauseous earlier however denies vomiting.  She denies being in pain. She endorses shortness of breath, worse with laying flat and with exertion. She denies chest pain, dysuria, hematuria, diarrhea.   Social history: Her daughter lives with her. She denies tobacco, etoh, and recreational drug use.   ROS: Constitutional: no weight change, no fever ENT/Mouth: no sore throat, no rhinorrhea Eyes: no eye pain, no vision changes Cardiovascular: no chest pain, + dyspnea,  + edema, no palpitations Respiratory: no cough, no sputum, no wheezing Gastrointestinal: no nausea, no vomiting, no diarrhea, no constipation Genitourinary: no urinary incontinence, no dysuria, no hematuria Musculoskeletal: no arthralgias, no myalgias Skin: no skin lesions, no pruritus, Neuro: + weakness, no loss of consciousness, no syncope Psych: no anxiety, no depression, no decrease appetite Heme/Lymph: no bruising, no bleeding  ED Course: Discussed with emergency medicine provider, patient requiring hospitalization for chief concerns of acute hypoxic respiratory failure.  Assessment/Plan  Principal Problem:   Acute hypoxemic respiratory failure (HCC) Active Problems:   Elevated troponin   Anemia due to stage 5 chronic kidney disease, not on chronic dialysis ([place])   Post-surgical hypothyroidism   Type 2 diabetes mellitus with other specified complication (HCC)   HTN (hypertension)   GERD (gastroesophageal reflux disease)   Paroxysmal atrial fibrillation with rapid ventricular response (HCC)   OA (osteoarthritis)   Diabetic retinopathy with macular edema (HCC)   Thrombocytopenia (HCC)   Malignant neoplasm of upper-outer quadrant of left breast in female, estrogen receptor positive ([place])   Arteriovenous  graft  for hemodialysis in place, primary   Iron deficiency   Assessment and Plan:  * Acute hypoxemic respiratory failure ([place]) - Etiology work-up in progress, differentials include volume overload in setting of CKD stage V with progression to end-stage renal disease versus pneumonia - Complete echo on 11/16/2021: Ejection fraction estimated at 55 to 88%, grade 2 diastolic dysfunction - COVID PCR ordered and pending collection - Procalcitonin on admission in process pending collection, if elevated would recommend a.m. team consider IV antibiotic initiation - Strict I's and O's - Status post furosemide 60 mg IV one-time dose per EDP - I have ordered furosemide 40 mg twice daily for 04/23/2022  Elevated troponin - Patient denies chest pain at this time - EKG ordered and pending - I suspect this is secondary to demand ischemia in setting of fluid overload in a patient with CKD stage V with possible progression to end-stage renal disease  Post-surgical hypothyroidism - Levothyroxine 88 mcg daily resumed  Type 2 diabetes mellitus with other specified complication (HCC) - Insulin SSI with at bedtime coverage, end-stage renal disease dosing ordered - Goal inpatient blood glucose levels 140-180  HTN (hypertension) - Amlodipine 5 mg daily, isosorbide dinitrate to 20 mg daily, carvedilol 12.5 mg p.o. twice daily with meals resumed - Hydralazine 5 mg IV every 8 hours as needed for SBP greater than 180  Paroxysmal atrial fibrillation with rapid ventricular response (HCC) - Eliquis not resumed on admission due to acute on chronic anemia - AM team to resume when appropriate  Thrombocytopenia ([place]) - CBC in the a.m.  Chart reviewed.   DVT prophylaxis: Heparin 5000 units subcutaneous every 8 hours Code Status: DNR/DNI Diet: Renal/carb modified Family Communication: [name], daughter at bedside with patient's permission Disposition Plan: Pending clinical course Consults called:  Nephrology Admission status: Telemetry cardiac, inpatient  Past Medical History:  Diagnosis Date   Anemia    Aortic stenosis 11/16/2021   a.) TTE 11/16/2021: EF 55-60%, mild AS (MPF 3.3 mmHg); AVA (VTI) = 2.52 cm   Atrial fibrillation (HCC)    a.) CHA2DS2-VASc = 9 (age x 2, sex, CHF, HTN, CVA x2, vascular disease history, T2DM);  b.) rate/rhythm maintained on oral carvedilol; chronically anticoagulated using apixban   Breast cancer, left ([place]) 09/19/2006   a.) moderately differentiated [place] (G2, T1N0Mx); s/p lumpectomy + adjuvant XRT   Breast cancer, left ([place]) 11/27/2017   a.) [place] (mpT2 pN0, G2, ER/PR positive, HER-2/neu negative); b.) mammosite   Carotid stenosis, asymptomatic 01/10/2013   a.) carotid dopplers 01/10/2013, 02/27/2014, 09/10/2017, 07/26/2020: >50% BECA, 1-39% BICA   Cataracts, bilateral    Chronic diastolic CHF (congestive heart failure) ([place])    a.) TTE 11/26/2007: EF 60%, LVH, triv TR/MR, AoV sclerosis with no stenosis, G1DD; b.) TTE 05/04/2016: EF 60-65%, LAE, G1DD; c.) TTE 11/16/2021: EF 55-60%, LVH, RVE, mild-mod LAE, mod MAC, mod MR, mild TR/AR, G2DD.   Coronary artery calcification seen on CT scan    CVA (cerebral vascular accident) ([place])    a.) remotes infarct(s) noted on CT imaging from 08/17/2017 --> RIGHT parietotemporal infarct; lacunar infarct LEFT pons   Diabetes type 2, controlled ([place]) 2000s   Diabetic retinopathy with macular edema ([place]) 03/2014   a.) Avastin injections as [place]   DJD (degenerative joint disease)    ESRD (end stage renal disease) ([place])    GERD (gastroesophageal reflux disease)    Glaucoma 03/2014   History of colon cancer 2006   a.) s/p colectomy   History  of hepatitis A 1990s   from food, resolved   HTN (hypertension)    Hyperplastic colon polyp    Long term current use of anticoagulant    a.) apixaban   Long term current use of antithrombotics/antiplatelets    a.) pentoxifylline   Macular degeneration    wet R  with vision loss, dry L; to see retina specialist   Neuropathy    OA (osteoarthritis)    Osteonecrosis (HCC)    hips?   PONV (postoperative nausea and vomiting)    Post-surgical hypothyroidism    Thrombocytopenia (HCC)    Tubular adenoma    Wears dentures    partial upper   Past Surgical History:  Procedure Laterality Date   ABDOMINAL HYSTERECTOMY  1976   partial, after tubal pregnancy, h/o fibroids with heavy bleeding   AV FISTULA PLACEMENT Right 02/01/2022   Procedure: INSERTION OF ARTERIOVENOUS (AV) GORE-TEX GRAFT ARM (LEFT UPPER EXTREMITY);  Surgeon: Algernon Huxley, MD;  Location: [facility] ORS;  Service: Vascular;  Laterality: Right;   BREAST BIOPSY Left 2008   positive   BREAST BIOPSY Left 11/22/2017   2oclock 2cmfn wing shaped clip, INVASIVE MAMMARY CARCINOMA   BREAST BIOPSY Left 11/22/2017   2oclock 4cmfn coil shaped clip, INVASIVE MAMMARY CARCINOMA   BREAST BIOPSY Left 11/22/2017   lymph node - butterfly hydroMARK   BREAST EXCISIONAL BIOPSY Left 01/07/2018   lumpectomy by Dr. Bary Castilla   BREAST LUMPECTOMY Left 2008   F/U radiation   BREAST LUMPECTOMY Left 2019   2 areas of [facility] at 2:00 position f/u with mammosite   BREAST LUMPECTOMY WITH SENTINEL LYMPH NODE BIOPSY Left 01/07/2018   Procedure: BREAST LUMPECTOMY WITH SENTINEL LYMPH NODE BX;  Surgeon: Robert Bellow, MD;  Location: [facility] ORS;  Service: General;  Laterality: Left;   BREAST MAMMOSITE  2008   carotid ultrasound  12/2012   1-39% bilateral stenosis, severe in ECA   CATARACT EXTRACTION W/PHACO Left 08/14/2016   Procedure: CATARACT EXTRACTION PHACO AND INTRAOCULAR LENS PLACEMENT ([place])  Left Diabetic;  Surgeon: Ronnell Freshwater, MD;  Location: [place];  Service: Ophthalmology;  Laterality: Left;  Diabetic - oral meds   CHOLECYSTECTOMY  2005   COLECTOMY  2005   colon cancer   COLON SURGERY  2006   bowel obstruction   COLONOSCOPY  2008   ext hem, ileo-colonic anastomosis in cecum, tubular adenoma  x1, rec rpt 1 yr for multiple polyps (in [place])   COLONOSCOPY WITH PROPOFOL N/A 10/19/2021   2.5cm TVA with low grade dysplasia, diverticulosis, no rpt recommended Vicente Males, Bailey Mech, MD)   ESOPHAGOGASTRODUODENOSCOPY N/A 05/06/2016   Procedure: ESOPHAGOGASTRODUODENOSCOPY (EGD);  Surgeon: Manya Silvas, MD;  Location: [facility] ENDOSCOPY;  Service: Endoscopy;  Laterality: N/A;   EYE SURGERY     JOINT REPLACEMENT     THYROIDECTOMY, PARTIAL     unclear why   TOTAL KNEE ARTHROPLASTY Right 1999   US ECHOCARDIOGRAPHY  11/2007   nl LV fxn EF 16%, diastolic dysfunction, LVH, tr TR and MR, slcerotic aortic valve   Social History:  reports that she has quit smoking. She has never used smokeless tobacco. She reports that she does not drink alcohol and does not use drugs.  Allergies  Allergen Reactions   Tramadol Other (See Comments)    nightmares   Codeine Anxiety   Sulfa Antibiotics Itching and Rash   Family History  Problem Relation Age of Onset   Diabetes Mother    Hypertension Mother    Arthritis  Mother    Alcohol abuse Father    Arthritis Father    Stroke Daughter    CAD Neg Hx    Cancer Neg Hx    Breast cancer Neg Hx    Family history: Family history reviewed and not pertinent  Prior to Admission medications   Medication Sig Start Date End Date Taking? Authorizing Provider  allopurinol (ZYLOPRIM) 100 MG tablet Take 1 tablet (100 mg total) by mouth daily. 07/14/21   Ria Bush, MD  amLODipine (NORVASC) 5 MG tablet Take 1 tablet (5 mg total) by mouth daily. 11/24/21 02/23/23  Kate Sable, MD  anastrozole (ARIMIDEX) 1 MG tablet Take 1 tablet (1 mg total) by mouth daily. 02/20/22   Cammie Sickle, MD  apixaban (ELIQUIS) 5 MG TABS tablet Take 1 tablet (5 mg total) by mouth 2 (two) times daily. 11/25/21   Ria Bush, MD  Blood Glucose Monitoring Suppl [facility] BLOOD GLUCOSE METER) W/DEVICE KIT Use as directed to check sugars daily 250.42 03/13/14   Ria Bush, MD   CALCIUM-MAGNESIUM PO Take 1 tablet by mouth daily.    [provider]  carvedilol (COREG) 25 MG tablet Take 12.5 mg by mouth 2 (two) times daily with a meal.    [provider]  Cholecalciferol (VITAMIN D-3) 5000 UNITS TABS Take 1 tablet by mouth daily.    [provider]  HYDROcodone-acetaminophen (NORCO/VICODIN) 5-325 MG tablet Take 2 tablets by mouth every 6 (six) hours as needed for moderate pain. 02/01/22 02/01/23  Algernon Huxley, MD  isosorbide dinitrate (ISORDIL) 20 MG tablet Take 1 tablet (20 mg total) by mouth daily. 07/14/21   Ria Bush, MD  levothyroxine (SYNTHROID) 88 MCG tablet TAKE 1 TABLET BY MOUTH EVERY DAY 11/30/21   Ria Bush, MD  pantoprazole (PROTONIX) 40 MG tablet Take 1 tablet (40 mg total) by mouth daily as needed. 11/25/21   Ria Bush, MD  pentoxifylline (TRENTAL) 400 MG CR tablet Take 1 tablet (400 mg total) by mouth daily. 07/15/21   Ria Bush, MD  saxagliptin HCl (ONGLYZA) 2.5 MG TABS tablet Take 1 tablet (2.5 mg total) by mouth daily. 09/19/21   Ria Bush, MD   Physical Exam: Vitals:   04/22/22 1153 04/22/22 1156 04/22/22 1503 04/22/22 1840  BP: (!) 158/80  (!) 150/61 (!) 179/78  Pulse: 81  76   Resp: 16  18   Temp: 99.2 F (37.3 C)  98.9 F (37.2 C)   TempSrc: Oral  Oral   SpO2: (!) 84% 95% 94%   Weight:  83.9 kg    Height:  5' 3" (1.6 m)     Constitutional: appears age-appropriate, frail, NAD, calm Eyes: PERRL, lids and conjunctivae normal ENMT: Mucous membranes are moist. Posterior pharynx clear of any exudate or lesions. Age-appropriate dentition. Hearing appropriate Neck: normal, supple, no masses, no thyromegaly Respiratory: clear to auscultation bilaterally, no wheezing, no crackles.  Increased respiratory effort.  Increased accessory muscle use.  Cardiovascular: Regular rate and rhythm, no murmurs / rubs / gallops.  Bilateral 2+ pitting extremity edema. 2+ pedal pulses. No carotid bruits.   Abdomen: Obese abdomen no tenderness, no masses palpated, no hepatosplenomegaly. Bowel sounds positive.  Musculoskeletal: no clubbing / cyanosis. No joint deformity upper and lower extremities. Good ROM, no contractures, no atrophy. Normal muscle tone.  Skin: no rashes, lesions, ulcers. No induration Neurologic: Sensation intact. Strength 5/5 in all 4.  Psychiatric: Normal judgment and insight. Alert and oriented x 3. Normal mood.   EKG: Ordered  Chest x-ray on Admission: I personally reviewed and I agree with radiologist reading as below.  DG Chest 2 View  Result Date: 04/22/2022 CLINICAL DATA:  sob EXAM: CHEST - 2 VIEW COMPARISON:  May 2023 FINDINGS: The heart appears near the upper limit of normal. No pleural effusion. No pneumothorax. Relatively low lung volumes with findings of increased vascular prominence and somewhat diffuse coarse reticular/interstitial opacities. There is a linear band of consolidation in the lower lung, best appreciated on the lateral view. Osseous structures are unremarkable. Calcifications of the aortic arch. IMPRESSION: 1. Increased pulmonary vascular and interstitial prominence, suggestive of pulmonary edema. 2. Lower lung consolidation best appreciated on the lateral view, atelectasis versus infection. Electronically Signed   By: Albin Felling M.D.   On: 04/22/2022 12:28    Labs on Admission: I have personally reviewed following labs  CBC: Recent Labs  Lab 04/22/22 1201  WBC 9.0  NEUTROABS 7.0  HGB 8.7*  HCT 29.2*  MCV 96.7  PLT 629*   Basic Metabolic Panel: Recent Labs  Lab 04/22/22 1201  NA 141  K 4.1  CL 108  CO2 22  GLUCOSE 149*  BUN 36*  CREATININE 5.03*  CALCIUM 9.3   GFR: Estimated Creatinine Clearance: 9.3 mL/min (A) (by C-G formula based on SCr of 5.03 mg/dL (H)).  Urine analysis:    Component Value Date/Time   COLORURINE YELLOW (A) 08/17/2017 2246   APPEARANCEUR CLEAR (A) 08/17/2017 2246   LABSPEC 1.012 08/17/2017 2246    PHURINE 6.0 08/17/2017 2246   GLUCOSEU 50 (A) 08/17/2017 2246   HGBUR SMALL (A) 08/17/2017 2246   BILIRUBINUR NEGATIVE 08/17/2017 2246   KETONESUR NEGATIVE 08/17/2017 2246   PROTEINUR 100 (A) 08/17/2017 2246   NITRITE NEGATIVE 08/17/2017 2246   LEUKOCYTESUR SMALL (A) 08/17/2017 2246   Dr. Tobie Poet [facility]  If 7PM-7AM, please contact overnight-coverage provider If 7AM-7PM, please contact day coverage provider [email or web address]  04/22/2022, 7:11 PM

## 2022-04-22 NOTE — Assessment & Plan Note (Signed)
-   Insulin SSI with at bedtime coverage, end-stage renal disease dosing ordered - Goal inpatient blood glucose levels 140-180

## 2022-04-22 NOTE — Assessment & Plan Note (Signed)
-   Levothyroxine 88 mcg daily resumed

## 2022-04-22 NOTE — ED Triage Notes (Signed)
Pt to ED POV with daughter for low oxygen at home. Home SPO2 was 77% on RA this morning and pulse was 120. Pt has been sleeping propped up with pillows past several days, has stage 5 CKD, no dialysis but R arm is restricted (has fistula but has not been used). Both lower legs swollen since 2 days ago.   SPO2 84% on RA, placed on 2L [place]. SPO2 91% on 2L.  1 set cultures drawn, lactic, extra lavendar for BNP, and routine labs all sent.

## 2022-04-22 NOTE — Assessment & Plan Note (Addendum)
-   Etiology work-up in progress, differentials include volume overload in setting of CKD stage V with progression to end-stage renal disease versus pneumonia - Complete echo on 11/16/2021: Ejection fraction estimated at 55 to 76%, grade 2 diastolic dysfunction - COVID PCR ordered and pending collection - Procalcitonin on admission in process pending collection, if elevated would recommend a.m. team consider IV antibiotic initiation - Strict I's and O's - Status post furosemide 60 mg IV one-time dose per EDP - I have ordered furosemide 40 mg twice daily for 04/23/2022

## 2022-04-22 NOTE — Assessment & Plan Note (Signed)
-   Eliquis not resumed on admission due to acute on chronic anemia - AM team to resume when appropriate

## 2022-04-22 NOTE — ED Provider Triage Note (Signed)
Emergency Medicine Provider Triage Evaluation Note  Alexis Tucker , a 81 y.o. female h/o CKD5 not on dialysis was evaluated in triage.  Pt complains of low oxygen and swelling to bilateral legs x2 days. Hypoxia to 77% on RA. Does not use O2 at home. Does not feel SOB. No CP. Pt reports nausea. Patient reports that she is still urinating. New orthopnea this week. No weight gain that she is aware of. No history of DVT/PE. No calf pain or leg pain.patient reports that she feels nauseous but otherwise normal.   Review of Systems  Positive: Nausea, leg swelling, orthopnea Negative: SOB/CP, fevers, abdominal pain/distention  Physical Exam  There were no vitals taken for this visit. Gen:   Awake, no distress   Resp:  Normal effort  MSK:   Moves extremities without difficulty  Other:  LE edema  Medical Decision Making  Medically screening exam initiated at 11:45 AM.  Appropriate orders placed.  Alexis Tucker was informed that the remainder of the evaluation will be completed by another provider, this initial triage assessment does not replace that evaluation, and the importance of remaining in the ED until their evaluation is complete.     Alexis Old, PA-C 04/22/22 1204

## 2022-04-22 NOTE — Assessment & Plan Note (Signed)
-   Patient denies chest pain at this time - EKG ordered and pending - I suspect this is secondary to demand ischemia in setting of fluid overload in a patient with CKD stage V with possible progression to end-stage renal disease

## 2022-04-22 NOTE — Consult Note (Signed)
[facility] CONSULT NOTE    Date: 04/22/2022                  Patient Name:  Alexis Tucker  MRN: [id number]  DOB: [birth year]  Age / Sex: 81 y.o., female         PCP: Ria Bush, MD                 Service Requesting Consult: Medicine                  Reason for Consult: Chronic kidney disease            History of Present Illness: Patient is a 81 y.o. female with a PMHx of hypertension, coronary artery disease, congestive heart failure, diabetes, history of cerebrovascular accident, GERD, atrial fibrillation, chronic kidney disease with anemia.  She has CKD stage IV/V and had an AV graft placement on the right arm recently.  She developed tingling and numbness along with hematoma.  She requested the vascular surgery to remove the AV graft and also drain the hematoma.  While patient is being scheduled for the same she developed shortness of breath, leg edema and was found to have decreased oxygen saturation in the emergency room.  Chest x-ray showed pulmonary edema.  She was given IV Lasix 60 mg today.  Medications: Outpatient medications: (Not in a hospital admission)   Discontinued Meds:  There are no discontinued medications.  Current medications: Current Facility-Administered Medications  Medication Dose Route Frequency Provider Last Rate Last Admin   furosemide (LASIX) injection 60 mg  60 mg Intravenous Once Nance Pear, MD       Current Outpatient Medications  Medication Sig Dispense Refill   allopurinol (ZYLOPRIM) 100 MG tablet Take 1 tablet (100 mg total) by mouth daily. 90 tablet 3   amLODipine (NORVASC) 5 MG tablet Take 1 tablet (5 mg total) by mouth daily. 30 tablet 3   anastrozole (ARIMIDEX) 1 MG tablet Take 1 tablet (1 mg total) by mouth daily. 90 tablet 1   apixaban (ELIQUIS) 5 MG TABS tablet Take 1 tablet (5 mg total) by mouth 2 (two) times daily. 60 tablet 6   Blood Glucose Monitoring Suppl (ACURA BLOOD GLUCOSE METER) W/DEVICE  KIT Use as directed to check sugars daily 250.42 1 kit 0   CALCIUM-MAGNESIUM PO Take 1 tablet by mouth daily.     carvedilol (COREG) 25 MG tablet Take 12.5 mg by mouth 2 (two) times daily with a meal.     Cholecalciferol (VITAMIN D-3) 5000 UNITS TABS Take 1 tablet by mouth daily.     HYDROcodone-acetaminophen (NORCO/VICODIN) 5-325 MG tablet Take 2 tablets by mouth every 6 (six) hours as needed for moderate pain. 20 tablet 0   isosorbide dinitrate (ISORDIL) 20 MG tablet Take 1 tablet (20 mg total) by mouth daily. 90 tablet 3   levothyroxine (SYNTHROID) 88 MCG tablet TAKE 1 TABLET BY MOUTH EVERY DAY 90 tablet 3   pantoprazole (PROTONIX) 40 MG tablet Take 1 tablet (40 mg total) by mouth daily as needed. 90 tablet 1   pentoxifylline (TRENTAL) 400 MG CR tablet Take 1 tablet (400 mg total) by mouth daily. 90 tablet 3   saxagliptin HCl (ONGLYZA) 2.5 MG TABS tablet Take 1 tablet (2.5 mg total) by mouth daily. 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   a.) moderately differentiated [place] (G2, T1N0Mx); s/p lumpectomy + adjuvant XRT   Breast cancer, 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   Coronary artery calcification seen on CT scan    CVA (cerebral vascular accident) ([place])    a.) remotes infarct(s) noted on CT imaging from 08/17/2017 --> RIGHT parietotemporal infarct; lacunar infarct LEFT pons   Diabetes type 2, controlled ([place]) 2000s   Diabetic retinopathy with macular edema ([place]) 03/2014   a.) Avastin injections as [place]   DJD (degenerative joint disease)    ESRD (end stage renal disease) ([place])    GERD (gastroesophageal reflux disease)    Glaucoma 03/2014   History of colon cancer 2006   a.) s/p colectomy   History of hepatitis A 1990s   from food, resolved   HTN (hypertension)    Hyperplastic colon polyp    Long term current use of anticoagulant    a.) apixaban   Long term current use of antithrombotics/antiplatelets    a.) pentoxifylline   Macular degeneration    wet R with vision loss, dry L; to see retina specialist   Neuropathy    OA (osteoarthritis)    Osteonecrosis (HCC)    hips?   PONV (postoperative nausea and vomiting)    Post-surgical hypothyroidism    Thrombocytopenia (HCC)    Tubular adenoma    Wears dentures    partial upper     Past Surgical History: Past Surgical History:  Procedure Laterality Date   ABDOMINAL HYSTERECTOMY  1976   partial, after tubal pregnancy, h/o fibroids with heavy bleeding   AV FISTULA PLACEMENT Right 02/01/2022   Procedure: INSERTION OF ARTERIOVENOUS (AV) GORE-TEX GRAFT ARM (LEFT UPPER EXTREMITY);  Surgeon: Algernon Huxley, MD;  Location: [facility] ORS;  Service: Vascular;  Laterality: Right;   BREAST BIOPSY Left 2008   positive   BREAST BIOPSY Left 11/22/2017   2oclock 2cmfn wing shaped clip, INVASIVE MAMMARY CARCINOMA   BREAST BIOPSY Left 11/22/2017   2oclock 4cmfn coil shaped clip,  INVASIVE MAMMARY CARCINOMA   BREAST BIOPSY Left 11/22/2017   lymph node - butterfly hydroMARK   BREAST EXCISIONAL BIOPSY Left 01/07/2018   lumpectomy by Dr. Bary Castilla   BREAST LUMPECTOMY Left 2008   F/U radiation   BREAST LUMPECTOMY Left 2019   2 areas of [facility] at 2:00 position f/u with mammosite   BREAST LUMPECTOMY WITH SENTINEL LYMPH NODE BIOPSY Left 01/07/2018   Procedure: BREAST LUMPECTOMY WITH SENTINEL LYMPH NODE BX;  Surgeon: Robert Bellow, MD;  Location: [facility] ORS;  Service: General;  Laterality: Left;   BREAST MAMMOSITE  2008   carotid ultrasound  12/2012   1-39% bilateral stenosis, severe in ECA   CATARACT EXTRACTION W/PHACO Left 08/14/2016   Procedure: CATARACT EXTRACTION PHACO AND INTRAOCULAR LENS PLACEMENT (IOC)  Left Diabetic;  Surgeon: Ronnell Freshwater, MD;  Location: [place];  Service: Ophthalmology;  Laterality: Left;  Diabetic - oral meds   CHOLECYSTECTOMY  2005   COLECTOMY  2005   colon cancer   COLON SURGERY  2006   bowel obstruction   COLONOSCOPY  2008   ext hem, ileo-colonic anastomosis in cecum, tubular adenoma x1, rec rpt 1 yr for multiple polyps (in [place])   COLONOSCOPY WITH PROPOFOL N/A 10/19/2021   2.5cm TVA with low grade dysplasia, diverticulosis, no rpt recommended Vicente Males, Bailey Mech, MD)   ESOPHAGOGASTRODUODENOSCOPY N/A 05/06/2016   Procedure: ESOPHAGOGASTRODUODENOSCOPY (EGD);  Surgeon: Manya Silvas, MD;  Location: [facility]COPY;  Service: Endoscopy;  Laterality: N/A;   EYE SURGERY     JOINT REPLACEMENT     THYROIDECTOMY, PARTIAL     unclear why   TOTAL KNEE ARTHROPLASTY Right 1999   US ECHOCARDIOGRAPHY  11/2007   nl LV fxn EF 81%, diastolic dysfunction, LVH, tr TR and MR, slcerotic aortic valve     Family History: Family History  Problem Relation Age of Onset   Diabetes Mother    Hypertension Mother    Arthritis Mother    Alcohol abuse Father    Arthritis Father    Stroke Daughter    CAD Neg Hx    Cancer Neg Hx    Breast  cancer Neg Hx      Social History: Social History   Socioeconomic History   Marital status: Divorced    Spouse name: Not on file   Number of children: Not on file   Years of education: Not on file   Highest education level: Not on file  Occupational History   Not on file  Tobacco Use   Smoking status: Former   Smokeless tobacco: Never   Tobacco comments:    quit over 40 yrs ago  Vaping Use   Vaping Use: Never used  Substance and Sexual Activity   Alcohol use: Never    Comment: rare   Drug use: Never   Sexual activity: Not Currently  Other Topics Concern   Not on file  Social History Narrative   Lives alone, 1 dog.   Granddaughter in [place] (attorney).   Divorced   Occu: retired, worked in Engineer, technical sales   Edu: college   Social Determinants of Radio broadcast assistant Strain: [place]  (12/28/2021)   Overall Financial Resource Strain (CARDIA)    Difficulty of Paying Living Expenses: Not hard at all  Food Insecurity: No Food Insecurity (12/28/2021)   Hunger Vital Sign    Worried About Running Out of Food in the Last Year: Never true    [place] in the Last Year: Never true  Transportation Needs: No Transportation Needs (12/28/2021)   PRAPARE - Hydrologist (Medical): No    Lack of Transportation (Non-Medical): No  Physical Activity: Inactive (12/28/2021)   Exercise Vital Sign    Days of Exercise per Week: 0 days    Minutes of Exercise per Session: 0 min  Stress: No Stress Concern Present (12/28/2021)   [place]    Feeling of Stress : Not  at all  Social Connections: Not on file  Intimate Partner Violence: Not on file     Review of Systems: As per HPI  Vital Signs: Blood pressure (!) 150/61, pulse 76, temperature 98.9 F (37.2 C), temperature source Oral, resp. rate 18, height 5' 3" (1.6 m), weight 83.9 kg, SpO2 94 %.  Weight trends: Filed Weights   04/22/22 1156   Weight: 83.9 kg    Physical Exam: Physical Exam: General:  No acute distress  Head:  Normocephalic, atraumatic. Moist oral mucosal membranes  Eyes:  Anicteric  Neck:  Supple  Lungs:   Clear to auscultation, normal effort  Heart:  S1S2 no rubs  Abdomen:   Soft, nontender, bowel sounds present  Extremities:  peripheral edema.  Neurologic:  Awake, alert, following commands  Skin:  No lesions  Access:     Lab results:  Basic Metabolic Panel: Recent Labs  Lab 04/22/22 1201  NA 141  K 4.1  CL 108  CO2 22  GLUCOSE 149*  BUN 36*  CREATININE 5.03*  CALCIUM 9.3    Creatinine  Date/Time Value Ref Range Status  10/16/2012 12:27 PM 3.93 (H) 0.60 - 1.30 mg/dL Final   Creat  Date/Time Value Ref Range Status  02/26/2014 12:00 AM 4.05  Final  10/15/2013 12:00 AM 4.11  Final  01/11/2012 12:00 AM 3.85  Final   Creatinine, Ser  Date/Time Value Ref Range Status  04/22/2022 12:01 PM 5.03 (H) 0.44 - 1.00 mg/dL Final  02/01/2022 12:18 PM 5.70 (H) 0.44 - 1.00 mg/dL Final  11/25/2021 12:17 PM 5.16 (HH) 0.40 - 1.20 mg/dL Final  11/16/2021 05:04 AM 4.78 (H) 0.44 - 1.00 mg/dL Final  11/15/2021 03:22 PM 4.72 (H) 0.44 - 1.00 mg/dL Final  06/11/2020 08:40 AM 4.59 (HH) 0.40 - 1.20 mg/dL Final  12/25/2018 01:12 PM 4.11 (H) 0.40 - 1.20 mg/dL Final  01/07/2018 08:59 AM 4.70 (H) 0.44 - 1.00 mg/dL Final  08/27/2017 03:37 PM 4.48 (H) 0.40 - 1.20 mg/dL Final  08/17/2017 02:20 PM 3.92 (H) 0.44 - 1.00 mg/dL Final  11/02/2016 02:41 PM 4.03 (H) 0.40 - 1.20 mg/dL Final  05/07/2016 04:55 AM 3.94 (H) 0.44 - 1.00 mg/dL Final  05/06/2016 05:36 AM 3.87 (H) 0.44 - 1.00 mg/dL Final  05/05/2016 03:59 AM 3.62 (H) 0.44 - 1.00 mg/dL Final  05/04/2016 12:10 AM 4.15 (H) 0.44 - 1.00 mg/dL Final  07/27/2015 05:39 PM 4.32 (H) 0.40 - 1.20 mg/dL Final  01/06/2013 03:03 PM 4.3 (H) 0.4 - 1.2 mg/dL Final    CBC: Recent Labs  Lab 04/22/22 1201  WBC 9.0  NEUTROABS 7.0  HGB 8.7*  HCT 29.2*  MCV 96.7  PLT 112*     Microbiology: Results for orders placed or performed in visit on 10/03/21  Fecal occult blood, imunochemical     Status: Abnormal   Collection Time: 10/03/21  3:38 PM   Specimen: Stool  Result Value Ref Range Status   Fecal Occult Bld Positive (A) Negative Final    Urinalysis: No results for input(s): "COLORURINE", "LABSPEC", "PHURINE", "GLUCOSEU", "HGBUR", "BILIRUBINUR", "KETONESUR", "PROTEINUR", "UROBILINOGEN", "NITRITE", "LEUKOCYTESUR" in the last 72 hours.  Invalid input(s): "APPERANCEUR"   Imaging:  DG Chest 2 View  Result Date: 04/22/2022 CLINICAL DATA:  sob EXAM: CHEST - 2 VIEW COMPARISON:  May 2023 FINDINGS: The heart appears near the upper limit of normal. No pleural effusion. No pneumothorax. Relatively low lung volumes with findings of increased vascular prominence and somewhat diffuse coarse reticular/interstitial opacities. There is a linear band  of consolidation in the lower lung, best appreciated on the lateral view. Osseous structures are unremarkable. Calcifications of the aortic arch. IMPRESSION: 1. Increased pulmonary vascular and interstitial prominence, suggestive of pulmonary edema. 2. Lower lung consolidation best appreciated on the lateral view, atelectasis versus infection. Electronically Signed   By: Albin Felling M.D.   On: 04/22/2022 12:28     Assessment & Plan:  81 y.o. female with a PMHx of hypertension, coronary artery disease, congestive heart failure, diabetes, history of cerebrovascular accident, GERD, atrial fibrillation, chronic kidney disease with anemia.  She has CKD stage IV/V and had an AV graft placement on the right arm recently.  She developed tingling and numbness along with hematoma.  She requested the vascular surgery to remove the AV graft and also drain the hematoma.  While patient is being scheduled for the same she developed shortness of breath, leg edema and was found to have decreased oxygen saturation in the emergency room.  #1:  Chronic kidney disease: Patient has CKD stage V with a GFR of less than 15 cc/min.  Presently not willing to do dialysis.  Would like to wait until she develops symptoms.  Diet advised.  #2: S/p AV graft/hematoma: Patient is scheduled for removal of the AV graft and draining of hematoma.  Vascular to follow.  #3: CHF/fluid overload: Advised patient on importance of fluid restriction.  We will continue the furosemide at 60 mg twice a day.  #4: Anemia: Anemia is most likely secondary to chronic kidney disease.  We will need erythropoietin and also iron supplementation.  #5: Hypertension: We will continue the carvedilol and amlodipine at the present doses.  #6: Diabetes: We will continue the Onglyza at the present doses.  Spoke to the patient and her daughter at bedside in detail and answered all their questions to their satisfaction.   LOS: 0 Lyla Son, MD [facility]. 10/21/20234:52 PM

## 2022-04-22 NOTE — Assessment & Plan Note (Addendum)
-   Amlodipine 5 mg daily, isosorbide dinitrate to 20 mg daily, carvedilol 12.5 mg p.o. twice daily with meals resumed - Hydralazine 5 mg IV every 8 hours as needed for SBP greater than 180

## 2022-04-22 NOTE — IPAL (Signed)
  Interdisciplinary Goals of Care Family Meeting   Date carried out: 04/22/2022  Location of the meeting: Bedside  Member's involved: Physician, Bedside Registered Nurse, and Family Member or next of kin  Durable Power of Attorney or acting medical decision maker: Patient is aware alert, and oriented to self, age, current location of hospital, current calendar year. Alexis Tucker is daughter who is primary care giver was at bedside the entirety of the discussion  Discussion: We discussed goals of care for Alexis Tucker.  I discussed extensively with Ms. Ison at bedside that she is currently in CKD stage V versus end-stage renal disease and the fluid has accumulated in her legs and both of her lungs.  This is why she is having shortness of breath and was hypoxic with oxygen of 77% on room air at home.  I discussed that if the IV Lasix does not work, it is likely that she is in end-stage renal disease and that if she does not endorsed to the use of dialysis, the result could be death.  She nods her head in understanding.  I repeated again if her decision is to declined hemodialysis and she says yes.  I asked her if her heart were to stop beating, meaning cardiac arrest, is it okay for [place] to perform chest compression also known as CPR?Marland Kitchen  Patient states 'no'.  I asked her that if she cannot breathe on her own and that her oxygen supplementation and or mask will no longer work, is it okay for [place] to perform intubation, putting a breathing tube down and hooking her up to machine to help breathe for her?  Patient states 'no'  Code status: Full DNR  Disposition: Continue current acute care  Time spent for the meeting: > 10 minutes  Dr. Tobie Poet  04/22/2022, 6:43 PM

## 2022-04-22 NOTE — ED Notes (Signed)
Changed oxygen tank. Tolerated well.

## 2022-04-22 NOTE — ED Triage Notes (Signed)
Swelling to bilateral lower extremities x 1 day and low oxygen sats today.  Reported sats in the 0's at home on RA.  Has history of ckd IV, and has opted for no dialysis.

## 2022-04-23 ENCOUNTER — Encounter (INDEPENDENT_AMBULATORY_CARE_PROVIDER_SITE_OTHER): Payer: Self-pay

## 2022-04-23 ENCOUNTER — Other Ambulatory Visit: Payer: Self-pay

## 2022-04-23 DIAGNOSIS — J9601 Acute respiratory failure with hypoxia: Secondary | ICD-10-CM | POA: Diagnosis not present

## 2022-04-23 LAB — CBG MONITORING, ED
Glucose-Capillary: 124 mg/dL — ABNORMAL HIGH (ref 70–99)
Glucose-Capillary: 131 mg/dL — ABNORMAL HIGH (ref 70–99)
Glucose-Capillary: 131 mg/dL — ABNORMAL HIGH (ref 70–99)
Glucose-Capillary: 116 mg/dL — ABNORMAL HIGH (ref 70–99)

## 2022-04-23 LAB — BASIC METABOLIC PANEL
Anion gap: 8 (ref 5–15)
BUN: 41 mg/dL — ABNORMAL HIGH (ref 8–23)
CO2: 24 mmol/L (ref 22–32)
Calcium: 9.2 mg/dL (ref 8.9–10.3)
Chloride: 110 mmol/L (ref 98–111)
Creatinine, Ser: 5.22 mg/dL — ABNORMAL HIGH (ref 0.44–1.00)
GFR, Estimated: 8 mL/min — ABNORMAL LOW (ref >60)
Glucose, Bld: 149 mg/dL — ABNORMAL HIGH (ref 70–99)
Potassium: 4.2 mmol/L (ref 3.5–5.1)
Sodium: 142 mmol/L (ref 135–145)

## 2022-04-23 LAB — CBC
HCT: 24.4 % — ABNORMAL LOW (ref 36.0–46.0)
Hemoglobin: 7.5 g/dL — ABNORMAL LOW (ref 12.0–15.0)
MCH: 29.2 pg (ref 26.0–34.0)
MCHC: 30.7 g/dL (ref 30.0–36.0)
MCV: 94.9 fL (ref 80.0–100.0)
Platelets: 94 10*3/uL — ABNORMAL LOW (ref 150–400)
RBC: 2.57 MIL/uL — ABNORMAL LOW (ref 3.87–5.11)
RDW: 14.5 % (ref 11.5–15.5)
WBC: 9.2 10*3/uL (ref 4.0–10.5)
nRBC: 0 % (ref 0.0–0.2)

## 2022-04-23 LAB — HEMOGLOBIN A1C
Hgb A1c MFr Bld: 5.8 % — ABNORMAL HIGH (ref 4.8–5.6)
Mean Plasma Glucose: 119.76 mg/dL

## 2022-04-23 LAB — PROCALCITONIN: Procalcitonin: 5.28 ng/mL

## 2022-04-23 MED ORDER — SODIUM CHLORIDE 0.9 % IV SOLN
2.0000 g | INTRAVENOUS | Status: DC
  Administered 2022-04-23 – 2022-04-25 (×2): 2 g via INTRAVENOUS
  Filled 2022-04-23 (×3): qty 20

## 2022-04-23 MED ORDER — SODIUM CHLORIDE 0.9 % IV SOLN
500.0000 mg | INTRAVENOUS | Status: DC
  Administered 2022-04-23 – 2022-04-25 (×3): 500 mg via INTRAVENOUS
  Filled 2022-04-23 (×3): qty 5

## 2022-04-23 MED ORDER — FUROSEMIDE 10 MG/ML IJ SOLN
60.0000 mg | Freq: Two times a day (BID) | INTRAMUSCULAR | Status: DC
  Administered 2022-04-23 – 2022-04-24 (×4): 60 mg via INTRAVENOUS
  Filled 2022-04-23 (×2): qty 8
  Filled 2022-04-23 (×2): qty 6

## 2022-04-23 NOTE — ED Notes (Signed)
Will need to defer head to toe assessment, pt is sleeping.

## 2022-04-23 NOTE — Progress Notes (Signed)
[facility]  PROGRESS NOTE   Subjective:   Patient states she feels better today.  Was given Lasix 60 mg intravenously twice since last night.  Objective:  Vital signs: Blood pressure 121/70, pulse 98, temperature 98.1 F (36.7 C), temperature source Oral, resp. rate 20, height '5\' 3"'$  (1.6 m), weight 83.9 kg, SpO2 92 %. No intake or output data in the 24 hours ending 04/23/22 1027 Filed Weights   04/22/22 1156  Weight: 83.9 kg     Physical Exam: General:  No acute distress  Head:  Normocephalic, atraumatic. Moist oral mucosal membranes  Eyes:  Anicteric  Neck:  Supple  Lungs:   Clear to auscultation, normal effort  Heart:  S1S2 no rubs  Abdomen:   Soft, nontender, bowel sounds present  Extremities: 2+ peripheral edema.  Neurologic:  Awake, alert, following commands  Skin:  No lesions  Access:     Basic Metabolic Panel: Recent Labs  Lab 04/22/22 1201 04/23/22 0612  NA 141 142  K 4.1 4.2  CL 108 110  CO2 22 24  GLUCOSE 149* 149*  BUN 36* 41*  CREATININE 5.03* 5.22*  CALCIUM 9.3 9.2    CBC: Recent Labs  Lab 04/22/22 1201 04/23/22 0612  WBC 9.0 9.2  NEUTROABS 7.0  --   HGB 8.7* 7.5*  HCT 29.2* 24.4*  MCV 96.7 94.9  PLT 112* 94*     Urinalysis: No results for input(s): "COLORURINE", "LABSPEC", "PHURINE", "GLUCOSEU", "HGBUR", "BILIRUBINUR", "KETONESUR", "PROTEINUR", "UROBILINOGEN", "NITRITE", "LEUKOCYTESUR" in the last 72 hours.  Invalid input(s): "APPERANCEUR"    Imaging: DG Chest 2 View  Result Date: 04/22/2022 CLINICAL DATA:  sob EXAM: CHEST - 2 VIEW COMPARISON:  May 2023 FINDINGS: The heart appears near the upper limit of normal. No pleural effusion. No pneumothorax. Relatively low lung volumes with findings of increased vascular prominence and somewhat diffuse coarse reticular/interstitial opacities. There is a linear band of consolidation in the lower lung, best appreciated on the lateral view. Osseous structures are unremarkable.  Calcifications of the aortic arch. IMPRESSION: 1. Increased pulmonary vascular and interstitial prominence, suggestive of pulmonary edema. 2. Lower lung consolidation best appreciated on the lateral view, atelectasis versus infection. Electronically Signed   By: Albin Felling M.D.   On: 04/22/2022 12:28     Medications:    azithromycin     cefTRIAXone (ROCEPHIN)  IV 2 g (04/23/22 0921)    allopurinol  100 mg Oral Daily   anastrozole  1 mg Oral Daily   carvedilol  12.5 mg Oral BID WC   furosemide  60 mg Intravenous BID   heparin  5,000 Units Subcutaneous Q8H   insulin aspart  0-5 Units Subcutaneous QHS   insulin aspart  0-6 Units Subcutaneous TID WC   isosorbide dinitrate  20 mg Oral Daily   levothyroxine  88 mcg Oral Q0600   linagliptin  5 mg Oral Daily   pentoxifylline  400 mg Oral Daily    Assessment/ Plan:     Principal Problem:   Acute hypoxemic respiratory failure (HCC) Active Problems:   Post-surgical hypothyroidism   OA (osteoarthritis)   Type 2 diabetes mellitus with other specified complication (HCC)   HTN (hypertension)   GERD (gastroesophageal reflux disease)   Diabetic retinopathy with macular edema (HCC)   Elevated troponin   Paroxysmal atrial fibrillation with rapid ventricular response (HCC)   Thrombocytopenia (HCC)   Anemia due to stage 5 chronic kidney disease, not on chronic dialysis ([place])   Malignant neoplasm of upper-outer  quadrant of left breast in female, estrogen receptor positive ([place])   Arteriovenous graft for hemodialysis in place, primary   Iron deficiency  81 y.o. female with a PMHx of hypertension, coronary artery disease, congestive heart failure, diabetes, history of cerebrovascular accident, GERD, atrial fibrillation, chronic kidney disease with anemia.  She has CKD stage IV/V and had an AV graft placement on the right arm recently.  She developed tingling and numbness along with hematoma.  She requested the vascular surgery to remove the AV  graft and also drain the hematoma.  While patient is being scheduled for the same she developed shortness of breath, leg edema and was found to have decreased oxygen saturation in the emergency room.   #1: Chronic kidney disease: Patient has CKD stage V with a GFR of less than 15 cc/min.  Presently not willing to do dialysis.  Would like to wait until she develops symptoms.  Diet advised.   #2: S/p AV graft/hematoma: Patient is scheduled for removal of the AV graft and draining of hematoma.  Vascular to follow in a.m.   #3: CHF/fluid overload: Advised patient on importance of fluid restriction.  We will continue the furosemide at 60 mg twice a day.   #4: Anemia: Anemia is most likely secondary to chronic kidney disease.  We will need erythropoietin and also iron supplementation.   #5: Hypertension: We will continue the carvedilol and amlodipine at the present doses.   #6: Diabetes: We will continue the Onglyza at the present doses.   Spoke to the patient and her daughter at bedside in detail and answered all their questions to their satisfaction.   LOS: [place], MD [facility] 10/22/202310:27 AM

## 2022-04-23 NOTE — Progress Notes (Signed)
PROGRESS NOTE    Jobina Maita  YKZ:[id number] DOB: [birth year] DOA: 04/22/2022 PCP: Ria Bush, MD   Brief Narrative: 81 year old with past medical history significant for hypertension, hypothyroidism, non-insulin-dependent diabetes, hypertensive CKD stage V with possible progression to end-stage renal disease status post right AV graft placement by vascular, paroxysmal A-fib, left-sided breast cancer ER positive, anemia of chronic disease, iron deficiency presents complaining of shortness of breath orthopnea, bilateral lower extremity swelling.  Patient was found to be hypoxic with oxygen saturation 84 on room air, she was subsequently placed on oxygen supplementation.  Patient admitted with acute hypoxic respiratory failure secondary to pulmonary edema in the setting of AKI on CKD concern for progression to ESRD   Assessment & Plan:   Principal Problem:   Acute hypoxemic respiratory failure (HCC) Active Problems:   Elevated troponin   Anemia due to stage 5 chronic kidney disease, not on chronic dialysis ([place])   Post-surgical hypothyroidism   Type 2 diabetes mellitus with other specified complication (HCC)   HTN (hypertension)   GERD (gastroesophageal reflux disease)   Paroxysmal atrial fibrillation with rapid ventricular response (HCC)   OA (osteoarthritis)   Diabetic retinopathy with macular edema (HCC)   Thrombocytopenia (HCC)   Malignant neoplasm of upper-outer quadrant of left breast in female, estrogen receptor positive ([place])   Arteriovenous graft for hemodialysis in place, primary   Iron deficiency   1-Acute hypoxic respiratory failure; In the setting of  pulmonary edema In the setting of pulmonary edema, volume overload in the setting of CKD approaching ESRD -Presents with elevated BNP chest x-ray with pulmonary edema mild elevation of troponins -This likely pneumonia but due to elevated procalcitonin we will do 5 days of ceftriaxone and azithromycin Increase IV  Lasix to 60 mg IV twice daily -Strict I's and O's   2-CKD stage V; Creatinine range 4.7--5.7 Patient currently declining dialysis Palliative care consulted Monitor on IV Lasix   3-Status post AV graft/hematoma: Vascular to be consulted tomorrow Hold Eliquis  4-Elevation of troponin Likely demand ischemia in the setting of pulmonary edema  5-Postsurgical hypothyroidism: Continue with Synthroid  6-Diabetes type 2: Continue with a sliding scale insulin  7-Hypertension: Continue with  Imdur and carvedilol Hold Norvasc due to soft blood pressure  8-Paroxysmal A-fib RVR Eliquis held due to anemia, will await vascular evaluation tomorrow in anticipation of any procedure Continue with carvedilol  9-Thrombocytopenia: 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   Weight as of this encounter: 83.9 kg.   DVT prophylaxis: SCDs Code Status: DNR Family Communication: Care discussed with patient and family at bedside during my evaluation Disposition Plan:  Status is: Inpatient Remains inpatient appropriate because: Pulmonary edema     Consultants:  Nephrology  Procedures:    Antimicrobials:    Subjective: She is alert and conversant, she reported breathing a little bit better She does not want undergoing hemodialysis Objective: Vitals:   04/23/22 0400 04/23/22 0430 04/23/22 0500 04/23/22 0530  BP: 129/64 (!) 124/59 (!) 116/53 123/60  Pulse: (!) 113 97 (!) 110 (!) 109  Resp: (!) 22 (!) 22 (!) 25 (!) 21  Temp:    98.7 F (37.1 C)  TempSrc:      SpO2: 95% 96% 96% 95%  Weight:      Height:       No intake or output data  in the 24 hours ending 04/23/22 0732 Filed Weights   04/22/22 1156  Weight: 83.9 kg    Examination:  General exam: Appears calm and comfortable  Respiratory system: Bilateral  crackles Cardiovascular system: S1 & S2 heard, RRR.  Gastrointestinal system: Abdomen is nondistended, soft and nontender. No organomegaly or masses felt. Normal bowel sounds heard. Central nervous system: Alert and oriented. No focal neurological deficits. Extremities: Symmetric 5 x 5 power.   Data Reviewed: I have personally reviewed following labs and imaging studies  CBC: Recent Labs  Lab 04/22/22 1201 04/23/22 0612  WBC 9.0 9.2  NEUTROABS 7.0  --   HGB 8.7* 7.5*  HCT 29.2* 24.4*  MCV 96.7 94.9  PLT 112* 94*   Basic Metabolic Panel: Recent Labs  Lab 04/22/22 1201 04/23/22 0612  NA 141 142  K 4.1 4.2  CL 108 110  CO2 22 24  GLUCOSE 149* 149*  BUN 36* 41*  CREATININE 5.03* 5.22*  CALCIUM 9.3 9.2   GFR: Estimated Creatinine Clearance: 9 mL/min (A) (by C-G formula based on SCr of 5.22 mg/dL (H)). Liver Function Tests: No results for input(s): "AST", "ALT", "ALKPHOS", "BILITOT", "PROT", "ALBUMIN" in the last 168 hours. No results for input(s): "LIPASE", "AMYLASE" in the last 168 hours. No results for input(s): "AMMONIA" in the last 168 hours. Coagulation Profile: No results for input(s): "INR", "PROTIME" in the last 168 hours. Cardiac Enzymes: No results for input(s): "CKTOTAL", "CKMB", "CKMBINDEX", "TROPONINI" in the last 168 hours. BNP (last 3 results) No results for input(s): "PROBNP" in the last 8760 hours. HbA1C: No results for input(s): "HGBA1C" in the last 72 hours. CBG: Recent Labs  Lab 04/22/22 2237  GLUCAP 151*   Lipid Profile: No results for input(s): "CHOL", "HDL", "LDLCALC", "TRIG", "CHOLHDL", "LDLDIRECT" in the last 72 hours. Thyroid Function Tests: No results for input(s): "TSH", "T4TOTAL", "FREET4", "T3FREE", "THYROIDAB" in the last 72 hours. Anemia Panel: No results for input(s): "VITAMINB12", "FOLATE", "FERRITIN", "TIBC", "IRON", "RETICCTPCT" in the last 72 hours. Sepsis Labs: Recent Labs  Lab 04/23/22 0612  PROCALCITON 5.28     Recent Results (from the past 240 hour(s))  SARS Coronavirus 2 by RT PCR (hospital order, performed in [facility] hospital lab) *cepheid single result test* Anterior Nasal Swab     Status: None   Collection Time: 04/22/22  6:39 PM   Specimen: Anterior Nasal Swab  Result Value Ref Range Status   SARS Coronavirus 2 by RT PCR NEGATIVE NEGATIVE Final    Comment: (NOTE) SARS-CoV-2 target nucleic acids are NOT DETECTED.  The SARS-CoV-2 RNA is generally detectable in upper and lower respiratory specimens during the acute phase of infection. The lowest concentration of SARS-CoV-2 viral copies this assay can detect is 250 copies / mL. A negative result does not preclude SARS-CoV-2 infection and should not be used as the sole basis for treatment or other patient management decisions.  A negative result may occur with improper specimen collection / handling, submission of specimen other than nasopharyngeal swab, presence of viral mutation(s) within the areas targeted by this assay, and inadequate number of viral copies (<250 copies / mL). A negative result must be combined with clinical observations, patient history, and epidemiological information.  Fact Sheet for Patients:   https://www.patel.info/  Fact Sheet for Healthcare Providers: https://hall.com/  This test is not yet approved or  cleared by the [place] FDA and has been authorized for detection and/or diagnosis of SARS-CoV-2 by FDA under an Emergency Use Authorization (EUA).  This EUA  will remain in effect (meaning this test can be used) for the duration of the COVID-19 declaration under Section 564(b)(1) of the Act, 21 U.S.C. section 360bbb-3(b)(1), unless the authorization is terminated or revoked sooner.  Performed at [facility], [address] [place], [place] [address]          Radiology Studies: DG Chest 2 View  Result Date: 04/22/2022 CLINICAL  DATA:  sob EXAM: CHEST - 2 VIEW COMPARISON:  May 2023 FINDINGS: The heart appears near the upper limit of normal. No pleural effusion. No pneumothorax. Relatively low lung volumes with findings of increased vascular prominence and somewhat diffuse coarse reticular/interstitial opacities. There is a linear band of consolidation in the lower lung, best appreciated on the lateral view. Osseous structures are unremarkable. Calcifications of the aortic arch. IMPRESSION: 1. Increased pulmonary vascular and interstitial prominence, suggestive of pulmonary edema. 2. Lower lung consolidation best appreciated on the lateral view, atelectasis versus infection. Electronically Signed   By: Albin Felling M.D.   On: 04/22/2022 12:28        Scheduled Meds:  allopurinol  100 mg Oral Daily   amLODipine  5 mg Oral Daily   anastrozole  1 mg Oral Daily   carvedilol  12.5 mg Oral BID WC   furosemide  60 mg Intravenous BID   heparin  5,000 Units Subcutaneous Q8H   insulin aspart  0-5 Units Subcutaneous QHS   insulin aspart  0-6 Units Subcutaneous TID WC   isosorbide dinitrate  20 mg Oral Daily   levothyroxine  88 mcg Oral Q0600   linagliptin  5 mg Oral Daily   pentoxifylline  400 mg Oral Daily   Continuous Infusions:   LOS: 1 day    Time spent: 35 minutes    Saundra Gin A Faigy Stretch, MD [facility]   If 7PM-7AM, please contact night-coverage [email or web address]  04/23/2022, 7:32 AM

## 2022-04-24 ENCOUNTER — Telehealth: Payer: Self-pay

## 2022-04-24 DIAGNOSIS — M1990 Unspecified osteoarthritis, unspecified site: Secondary | ICD-10-CM

## 2022-04-24 DIAGNOSIS — I10 Essential (primary) hypertension: Secondary | ICD-10-CM

## 2022-04-24 DIAGNOSIS — N186 End stage renal disease: Secondary | ICD-10-CM

## 2022-04-24 DIAGNOSIS — Z170 Estrogen receptor positive status [ER+]: Secondary | ICD-10-CM

## 2022-04-24 DIAGNOSIS — J9601 Acute respiratory failure with hypoxia: Secondary | ICD-10-CM | POA: Diagnosis not present

## 2022-04-24 DIAGNOSIS — E890 Postprocedural hypothyroidism: Secondary | ICD-10-CM

## 2022-04-24 DIAGNOSIS — Z66 Do not resuscitate: Secondary | ICD-10-CM

## 2022-04-24 DIAGNOSIS — D631 Anemia in chronic kidney disease: Secondary | ICD-10-CM

## 2022-04-24 DIAGNOSIS — E11311 Type 2 diabetes mellitus with unspecified diabetic retinopathy with macular edema: Secondary | ICD-10-CM

## 2022-04-24 DIAGNOSIS — C50412 Malignant neoplasm of upper-outer quadrant of left female breast: Secondary | ICD-10-CM

## 2022-04-24 DIAGNOSIS — Z95828 Presence of other vascular implants and grafts: Secondary | ICD-10-CM | POA: Diagnosis not present

## 2022-04-24 DIAGNOSIS — T82898A Other specified complication of vascular prosthetic devices, implants and grafts, initial encounter: Secondary | ICD-10-CM

## 2022-04-24 DIAGNOSIS — Z515 Encounter for palliative care: Secondary | ICD-10-CM

## 2022-04-24 DIAGNOSIS — E1169 Type 2 diabetes mellitus with other specified complication: Secondary | ICD-10-CM

## 2022-04-24 DIAGNOSIS — N185 Chronic kidney disease, stage 5: Secondary | ICD-10-CM

## 2022-04-24 DIAGNOSIS — Z87891 Personal history of nicotine dependence: Secondary | ICD-10-CM

## 2022-04-24 LAB — CBC
HCT: 25.1 % — ABNORMAL LOW (ref 36.0–46.0)
Hemoglobin: 8 g/dL — ABNORMAL LOW (ref 12.0–15.0)
MCH: 29.7 pg (ref 26.0–34.0)
MCHC: 31.9 g/dL (ref 30.0–36.0)
MCV: 93.3 fL (ref 80.0–100.0)
Platelets: 100 10*3/uL — ABNORMAL LOW (ref 150–400)
RBC: 2.69 MIL/uL — ABNORMAL LOW (ref 3.87–5.11)
RDW: 14.5 % (ref 11.5–15.5)
WBC: 6 10*3/uL (ref 4.0–10.5)
nRBC: 0 % (ref 0.0–0.2)

## 2022-04-24 LAB — BASIC METABOLIC PANEL
Anion gap: 9 (ref 5–15)
BUN: 47 mg/dL — ABNORMAL HIGH (ref 8–23)
CO2: 23 mmol/L (ref 22–32)
Calcium: 9.1 mg/dL (ref 8.9–10.3)
Chloride: 109 mmol/L (ref 98–111)
Creatinine, Ser: 5.64 mg/dL — ABNORMAL HIGH (ref 0.44–1.00)
GFR, Estimated: 7 mL/min — ABNORMAL LOW (ref >60)
Glucose, Bld: 124 mg/dL — ABNORMAL HIGH (ref 70–99)
Potassium: 3.7 mmol/L (ref 3.5–5.1)
Sodium: 141 mmol/L (ref 135–145)

## 2022-04-24 LAB — GLUCOSE, CAPILLARY
Glucose-Capillary: 134 mg/dL — ABNORMAL HIGH (ref 70–99)
Glucose-Capillary: 119 mg/dL — ABNORMAL HIGH (ref 70–99)
Glucose-Capillary: 147 mg/dL — ABNORMAL HIGH (ref 70–99)

## 2022-04-24 LAB — PROCALCITONIN: Procalcitonin: 7.92 ng/mL

## 2022-04-24 LAB — CBG MONITORING, ED: Glucose-Capillary: 87 mg/dL (ref 70–99)

## 2022-04-24 NOTE — ED Notes (Signed)
Pt sleeping, resps unlabored. Pure wick in place, yellow urine draining into cannister.

## 2022-04-24 NOTE — Consult Note (Signed)
[facility] Vascular Consult Note  MRN : [id number]  Alexis Tucker is a 81 y.o. ([birth year]) female who presents with chief complaint of  Chief Complaint  Patient presents with   low oxygen   Leg Swelling  .   Consulting Physician:Belkys, Regalado, MD Reason for consult: Right AV graft access with hematoma History of Present Illness: Alexis Tucker is a 81 year old female well-known to our practice.  She has a previous medical history of hypertension, diabetes, chronic kidney disease stage V who presented to [facility] due to shortness of breath with bilateral lower extremity edema.  She was noted to be hypoxic with an O2 saturation of 84%.  She was placed on oxygen supplementation.  She is also receiving Lasix for diuresis.  There is concern that this will worsen her renal failure.  The patient previously discussed with [place] that she does not wish to move forward with any sort of dialysis whether to be peritoneal or hemodialysis.  We previously planned to remove her graft as well as the associated hematoma.  Since that time she has had the development of some numbness and tingling in her right upper extremity however its not extremely painful for her currently.  Current Facility-Administered Medications  Medication Dose Route Frequency Provider Last Rate Last Admin   acetaminophen (TYLENOL) tablet 650 mg  650 mg Oral Q6H PRN Cox, Amy N, DO   650 mg at 04/23/22 1158   Or   acetaminophen (TYLENOL) suppository 650 mg  650 mg Rectal Q6H PRN Cox, Amy N, DO       allopurinol (ZYLOPRIM) tablet 100 mg  100 mg Oral Daily Cox, Amy N, DO   100 mg at 04/24/22 0955   anastrozole (ARIMIDEX) tablet 1 mg  1 mg Oral Daily Cox, Amy N, DO   1 mg at 04/24/22 0955   azithromycin (ZITHROMAX) 500 mg in sodium chloride 0.9 % 250 mL IVPB  500 mg Intravenous Q24H Regalado, Belkys A, MD 250 mL/hr at 04/24/22 1019 500 mg at 04/24/22 1019   carvedilol (COREG) tablet 12.5 mg   12.5 mg Oral BID WC Cox, Amy N, DO   12.5 mg at 04/24/22 1853   cefTRIAXone (ROCEPHIN) 2 g in sodium chloride 0.9 % 100 mL IVPB  2 g Intravenous Q24H Regalado, Belkys A, MD   Stopped at 04/23/22 1039   furosemide (LASIX) injection 60 mg  60 mg Intravenous BID Regalado, Belkys A, MD   60 mg at 04/24/22 1855   heparin injection 5,000 Units  5,000 Units Subcutaneous Q8H Cox, Amy N, DO   5,000 Units at 04/24/22 5784   hydrALAZINE (APRESOLINE) injection 5 mg  5 mg Intravenous Q8H PRN Cox, Amy N, DO       insulin aspart (novoLOG) injection 0-5 Units  0-5 Units Subcutaneous QHS Cox, Amy N, DO       insulin aspart (novoLOG) injection 0-6 Units  0-6 Units Subcutaneous TID WC Cox, Amy N, DO       isosorbide dinitrate (ISORDIL) tablet 20 mg  20 mg Oral Daily Cox, Amy N, DO   20 mg at 04/24/22 0955   levothyroxine (SYNTHROID) tablet 88 mcg  88 mcg Oral Q0600 Cox, Amy N, DO   88 mcg at 04/23/22 0631   linagliptin (TRADJENTA) tablet 5 mg  5 mg Oral Daily Cox, Amy N, DO   5 mg at 04/24/22 0955   ondansetron (ZOFRAN) tablet 4 mg  4 mg Oral Q6H PRN Cox, Amy  N, DO       Or   ondansetron (ZOFRAN) injection 4 mg  4 mg Intravenous Q6H PRN Cox, Amy N, DO       pantoprazole (PROTONIX) EC tablet 40 mg  40 mg Oral Daily PRN Cox, Amy N, DO       pentoxifylline (TRENTAL) CR tablet 400 mg  400 mg Oral Daily Cox, Amy N, DO   400 mg at 04/24/22 4098   senna-docusate (Senokot-S) tablet 1 tablet  1 tablet Oral QHS PRN Cox, Amy N, DO        Past Medical History:  Diagnosis Date   Anemia    Aortic stenosis 11/16/2021   a.) TTE 11/16/2021: EF 55-60%, mild AS (MPF 3.3 mmHg); AVA (VTI) = 2.52 cm   Atrial fibrillation (HCC)    a.) CHA2DS2-VASc = 9 (age x 2, sex, CHF, HTN, CVA x2, vascular disease history, T2DM);  b.) rate/rhythm maintained on oral carvedilol; chronically anticoagulated using apixban   Breast cancer, left ([place]) 09/19/2006   a.) moderately differentiated IMC (G2, T1N0Mx); s/p lumpectomy + adjuvant XRT   Breast  cancer, left ([place]) 11/27/2017   a.) [place] (mpT2 pN0, G2, ER/PR positive, HER-2/neu negative); b.) mammosite   Carotid stenosis, asymptomatic 01/10/2013   a.) carotid dopplers 01/10/2013, 02/27/2014, 09/10/2017, 07/26/2020: >50% BECA, 1-39% BICA   Cataracts, bilateral    Chronic diastolic CHF (congestive heart failure) ([place])    a.) TTE 11/26/2007: EF 60%, LVH, triv TR/MR, AoV sclerosis with no stenosis, G1DD; b.) TTE 05/04/2016: EF 60-65%, LAE, G1DD; c.) TTE 11/16/2021: EF 55-60%, LVH, RVE, mild-mod LAE, mod MAC, mod MR, mild TR/AR, G2DD.   Coronary artery calcification seen on CT scan    CVA (cerebral vascular accident) ([place])    a.) remotes infarct(s) noted on CT imaging from 08/17/2017 --> RIGHT parietotemporal infarct; lacunar infarct LEFT pons   Diabetes type 2, controlled ([place]) 2000s   Diabetic retinopathy with macular edema ([place]) 03/2014   a.) Avastin injections as [place]   DJD (degenerative joint disease)    ESRD (end stage renal disease) ([place])    GERD (gastroesophageal reflux disease)    Glaucoma 03/2014   History of colon cancer 2006   a.) s/p colectomy   History of hepatitis A 1990s   from food, resolved   HTN (hypertension)    Hyperplastic colon polyp    Long term current use of anticoagulant    a.) apixaban   Long term current use of antithrombotics/antiplatelets    a.) pentoxifylline   Macular degeneration    wet R with vision loss, dry L; to see retina specialist   Neuropathy    OA (osteoarthritis)    Osteonecrosis (HCC)    hips?   PONV (postoperative nausea and vomiting)    Post-surgical hypothyroidism    Thrombocytopenia (HCC)    Tubular adenoma    Wears dentures    partial upper    Past Surgical History:  Procedure Laterality Date   ABDOMINAL HYSTERECTOMY  1976   partial, after tubal pregnancy, h/o fibroids with heavy bleeding   AV FISTULA PLACEMENT Right 02/01/2022   Procedure: INSERTION OF ARTERIOVENOUS (AV) GORE-TEX GRAFT ARM (LEFT UPPER  EXTREMITY);  Surgeon: Algernon Huxley, MD;  Location: [facility]S;  Service: Vascular;  Laterality: Right;   BREAST BIOPSY Left 2008   positive   BREAST BIOPSY Left 11/22/2017   2oclock 2cmfn wing shaped clip, INVASIVE MAMMARY CARCINOMA   BREAST BIOPSY Left 11/22/2017   2oclock 4cmfn coil shaped  clip, INVASIVE MAMMARY CARCINOMA   BREAST BIOPSY Left 11/22/2017   lymph node - butterfly hydroMARK   BREAST EXCISIONAL BIOPSY Left 01/07/2018   lumpectomy by Dr. Bary Castilla   BREAST LUMPECTOMY Left 2008   F/U radiation   BREAST LUMPECTOMY Left 2019   2 areas of [facility] at 2:00 position f/u with mammosite   BREAST LUMPECTOMY WITH SENTINEL LYMPH NODE BIOPSY Left 01/07/2018   Procedure: BREAST LUMPECTOMY WITH SENTINEL LYMPH NODE BX;  Surgeon: Robert Bellow, MD;  Location: [facility] ORS;  Service: General;  Laterality: Left;   BREAST MAMMOSITE  2008   carotid ultrasound  12/2012   1-39% bilateral stenosis, severe in ECA   CATARACT EXTRACTION W/PHACO Left 08/14/2016   Procedure: CATARACT EXTRACTION PHACO AND INTRAOCULAR LENS PLACEMENT ([place])  Left Diabetic;  Surgeon: Ronnell Freshwater, MD;  Location: [place];  Service: Ophthalmology;  Laterality: Left;  Diabetic - oral meds   CHOLECYSTECTOMY  2005   COLECTOMY  2005   colon cancer   COLON SURGERY  2006   bowel obstruction   COLONOSCOPY  2008   ext hem, ileo-colonic anastomosis in cecum, tubular adenoma x1, rec rpt 1 yr for multiple polyps (in [place])   COLONOSCOPY WITH PROPOFOL N/A 10/19/2021   2.5cm TVA with low grade dysplasia, diverticulosis, no rpt recommended Vicente Males, Bailey Mech, MD)   ESOPHAGOGASTRODUODENOSCOPY N/A 05/06/2016   Procedure: ESOPHAGOGASTRODUODENOSCOPY (EGD);  Surgeon: Manya Silvas, MD;  Location: [facility];  Service: Endoscopy;  Laterality: N/A;   EYE SURGERY     JOINT REPLACEMENT     THYROIDECTOMY, PARTIAL     unclear why   TOTAL KNEE ARTHROPLASTY Right 1999   US ECHOCARDIOGRAPHY  11/2007   nl LV fxn EF 93%, diastolic  dysfunction, LVH, tr TR and MR, slcerotic aortic valve    Social History Social History   Tobacco Use   Smoking status: Former   Smokeless tobacco: Never   Tobacco comments:    quit over 40 yrs ago  Vaping Use   Vaping Use: Never used  Substance Use Topics   Alcohol use: Never    Comment: rare   Drug use: Never    Family History Family History  Problem Relation Age of Onset   Diabetes Mother    Hypertension Mother    Arthritis Mother    Alcohol abuse Father    Arthritis Father    Stroke Daughter    CAD Neg Hx    Cancer Neg Hx    Breast cancer Neg Hx     Allergies  Allergen Reactions   Tramadol Other (See Comments)    nightmares   Codeine Anxiety   Sulfa Antibiotics Itching and Rash     REVIEW OF SYSTEMS (Negative unless checked)  Constitutional: _0 Weight loss  _1 Fever  _2 Chills Cardiac: _3 Chest pain   _4 Chest pressure   _5 Palpitations   _6 Shortness of breath when laying flat   _7 Shortness of breath at rest   _8 Shortness of breath with exertion. Vascular:  _9 Pain in legs with walking   _10 Pain in legs at rest   _11 Pain in legs when laying flat   _12 Claudication   _13 Pain in feet when walking  _14 Pain in feet at rest  _15 Pain in feet when laying flat   _16 History of DVT   _17 Phlebitis   _18 Swelling in legs   _19 Varicose veins   _20 Non-healing ulcers Pulmonary:   _21 Uses home oxygen   _22 Productive cough   _23 Hemoptysis   _24 Wheeze  _25 COPD   _26 Asthma Neurologic:  _27 Dizziness  _28   Blackouts   _0 Seizures   _1 History of stroke   _2 History of TIA  _3 Aphasia   _4 Temporary blindness   _5 Dysphagia   _6 Weakness or numbness in arms   _7 Weakness or numbness in legs Musculoskeletal:  _8 Arthritis   _9 Joint swelling   _10 Joint pain   _11 Low back pain Hematologic:  _12 Easy bruising  _13 Easy bleeding   _14 Hypercoagulable state   _15 Anemic  _16 Hepatitis Gastrointestinal:  _17 Blood in stool   _18 Vomiting blood  _19 Gastroesophageal reflux/heartburn   _20 Difficulty swallowing. Genitourinary:  _21 Chronic kidney  disease   _22 Difficult urination  _23 Frequent urination  _24 Burning with urination   _25 Blood in urine Skin:  _26 Rashes   _27 Ulcers   _28 Wounds Psychological:  _29 History of anxiety   _30  History of major depression.  Physical Examination  Vitals:   04/24/22 0844 04/24/22 1208 04/24/22 1511 04/24/22 1950  BP: 122/73 (!) 129/50 125/88 (!) 119/59  Pulse: 82 80 99 89  Resp: _31 Temp: 98 F (36.7 C) 98.1 F (36.7 C) 98 F (36.7 C) 99.3 F (37.4 C)  TempSrc:  Oral  Oral  SpO2:  94%  100%  Weight:      Height:       Body mass index is 32.77 kg/m. Gen:  WD/WN, NAD, weak but alert Head: [place]/AT, No temporalis wasting. Prominent temp pulse not noted. Ear/Nose/Throat: Hearing grossly intact, nares w/o erythema or drainage, oropharynx w/o Erythema/Exudate Eyes: Sclera non-icteric, conjunctiva clear Neck: Trachea midline.  No JVD.  Pulmonary:  Good air movement, respirations not labored, equal bilaterally.  Cardiac: RRR, normal S1, S2. Vascular: Right upper extremity AV graft Vessel Right Left  Radial Palpable Palpable   Gastrointestinal: soft, non-tender/non-distended. No guarding/reflex.  Musculoskeletal: M/S 5/5 throughout.  Extremities without ischemic changes.  No deformity or atrophy. No edema. Neurologic: Sensation grossly intact in extremities.  Symmetrical.  Speech is fluent. Motor exam as listed above. Psychiatric: Judgment intact, Mood & affect appropriate for pt's clinical situation. Dermatologic: No rashes or ulcers noted.  No cellulitis or open wounds. Lymph : No Cervical, Axillary, or Inguinal lymphadenopathy.    CBC Lab Results  Component Value Date   WBC 6.0 04/24/2022   HGB 8.0 (L) 04/24/2022   HCT 25.1 (L) 04/24/2022   MCV 93.3 04/24/2022   PLT 100 (L) 04/24/2022    BMET    Component Value Date/Time   NA 141 04/24/2022 0900   NA 140 10/16/2012 1227   K 3.7 04/24/2022 0900   K 4.3 02/26/2014 0000   CL 109 04/24/2022 0900   CL 106 10/16/2012 1227   CO2  23 04/24/2022 0900   CO2 24 10/16/2012 1227   GLUCOSE 124 (H) 04/24/2022 0900   GLUCOSE 262 (H) 10/16/2012 1227   BUN 47 (H) 04/24/2022 0900   BUN 22 (H) 10/16/2012 1227   CREATININE 5.64 (H) 04/24/2022 0900   CREATININE 4.05 02/26/2014 0000   CALCIUM 9.1 04/24/2022 0900   CALCIUM 9.0 10/16/2012 1227   GFRNONAA 7 (L) 04/24/2022 0900   GFRAA 10 (L) 01/07/2018 0859   Estimated Creatinine Clearance: 8.3 mL/min (A) (by C-G formula based on SCr of 5.64 mg/dL (H)).  COAG Lab Results  Component Value Date   INR 1.03 12/28/2017    Radiology DG Chest 2 View  Result Date: 04/22/2022 CLINICAL DATA:  sob EXAM: CHEST - 2 VIEW COMPARISON:  May 2023 FINDINGS: The heart appears near the upper limit of normal. No pleural effusion. No pneumothorax. Relatively low lung volumes with findings of increased vascular prominence  and somewhat diffuse coarse reticular/interstitial opacities. There is a linear band of consolidation in the lower lung, best appreciated on the lateral view. Osseous structures are unremarkable. Calcifications of the aortic arch. IMPRESSION: 1. Increased pulmonary vascular and interstitial prominence, suggestive of pulmonary edema. 2. Lower lung consolidation best appreciated on the lateral view, atelectasis versus infection. Electronically Signed   By: Albin Felling M.D.   On: 04/22/2022 12:28   VAS US DUPLEX DIALYSIS ACCESS (AVF, AVG)  Result Date: 04/18/2022 DIALYSIS ACCESS Patient Name:  COVA KNIERIEM  Date of Exam:   04/14/2022 Medical Rec #: [id number]     Accession #:    [id number] Date of Birth: 1940-08-04     Patient Gender: F Patient Age:   35 years Exam Location:  [facility] Procedure:      VAS US DUPLEX DIALYSIS ACCESS (AVF, AVG) Referring Phys: Eulogio Ditch --------------------------------------------------------------------------------  Reason for Exam: Routine follow up. History: Right AVG created 02/01/2022. Comparison Study: 03/2022 Performing Technologist:  Concha Norway RVT  Examination Guidelines: A complete evaluation includes B-mode imaging, spectral Doppler, color Doppler, and power Doppler as needed of all accessible portions of each vessel. Unilateral testing is considered an integral part of a complete examination. Limited examinations for reoccurring indications may be performed as noted.  Findings:   +--------------------+----------+-----------------+----------------------------+ AVG                 PSV (cm/s)Flow Vol (mL/min)          Describe           +--------------------+----------+-----------------+----------------------------+ Native artery inflow   197          2521                                    +--------------------+----------+-----------------+----------------------------+ Arterial anastomosis   481                      stenotic and due to Large                                                            hematoma           +--------------------+----------+-----------------+----------------------------+ Prox graft             213                                                  +--------------------+----------+-----------------+----------------------------+ Mid graft              103                                                  +--------------------+----------+-----------------+----------------------------+ Distal graft           126                                                  +--------------------+----------+-----------------+----------------------------+  Venous anastomosis     109                                                  +--------------------+----------+-----------------+----------------------------+ Venous outflow         103                                                  +--------------------+----------+-----------------+----------------------------+ +--------------+------------+----------+---------+---------+-------------------+                 Diameter  Depth (cm)Branching    PSV       Flow Volume                       (cm)                        (cm/s)       (ml/min)       +--------------+------------+----------+---------+---------+-------------------+ Rt Rad Art Dis                                  32                        +--------------+------------+----------+---------+---------+-------------------+ Antegrade                                                                 +--------------+------------+----------+---------+---------+-------------------+ Rt Uln Art Dis                                  31                        +--------------+------------+----------+---------+---------+-------------------+ Antegrade                                                                 +--------------+------------+----------+---------+---------+-------------------+  Summary: Patent new right brachax AVG. The anastomosis still appears stenotic probably due to large hematoma again seen. Hematoma measures 4.67 x 4.96cm, slighlty smaller than previous exam.  *See table(s) above for measurements and observations.  Diagnosing physician: Leotis Pain MD Electronically signed by Leotis Pain MD on 04/18/2022 at 4:01:17 PM.   --------------------------------------------------------------------------------   Final       Assessment/Plan 1.  End-stage renal disease.  As with previous discussion with the patient she does not wish to undergo hemodialysis or peritoneal dialysis at this time.  Prior to her hospitalization we had discussed removal of her AV graft as well as the associated hematoma.  However given her worsening renal function as well as her recent pulmonary edema, having the patient undergo a procedure  when unnecessary would not be in her best interest.  She has started to experience some worsening tingling of her hand.  Based on decision to move forward with hospice care, this should be controlled with appropriate analgesics.  However, if the numbness  and tingling becomes extremely painful or if motor function is worsened we can consider a ligation of her access.  Otherwise, we will not plan on any further surgical intervention.   Plan of care discussed with Dr.Dew and he is in agreement with plan noted above.   Family Communication: Daughter At bedside  Total Time: 84 minutes I spent 55 minutes in this encounter including personally reviewing extensive medical records, personally reviewing imaging studies and compared to prior scans, counseling the patient, placing orders, coordinating care and performing appropriate documentation  Thank you for allowing [place] to participate in the care of this patient.   Kris Hartmann, NP [facility] [phone] (Office Phone) [phone] (Office Fax) [phone] (Pager)  04/24/2022 9:16 PM  Staff may message me via secure chat in [place]  but this may not receive immediate response,  please page for urgent matters!  Dictation software was used to generate the above note. Typos may occur and escape review, as with typed/written notes. Any error is purely unintentional.  Please contact me directly for clarity if needed.

## 2022-04-24 NOTE — Progress Notes (Addendum)
PROGRESS NOTE    Alexis Tucker  JZP:[id number] DOB: Sep 15, 1940 DOA: 04/22/2022 PCP: Ria Bush, MD   Brief Narrative: 81 year old with past medical history significant for hypertension, hypothyroidism, non-insulin-dependent diabetes, hypertensive CKD stage V with possible progression to end-stage renal disease status post right AV graft placement by vascular, paroxysmal A-fib, left-sided breast cancer ER positive, anemia of chronic disease, iron deficiency presents complaining of shortness of breath orthopnea, bilateral lower extremity swelling.  Patient was found to be hypoxic with oxygen saturation 84 on room air, she was subsequently placed on oxygen supplementation.  Patient admitted with acute hypoxic respiratory failure secondary to pulmonary edema in the setting of AKI on CKD concern for progression to ESRD   Assessment & Plan:   Principal Problem:   Acute hypoxemic respiratory failure (HCC) Active Problems:   Elevated troponin   Anemia due to stage 5 chronic kidney disease, not on chronic dialysis ([place])   Post-surgical hypothyroidism   Type 2 diabetes mellitus with other specified complication (HCC)   HTN (hypertension)   GERD (gastroesophageal reflux disease)   Paroxysmal atrial fibrillation with rapid ventricular response (HCC)   OA (osteoarthritis)   Diabetic retinopathy with macular edema (HCC)   Thrombocytopenia (HCC)   Malignant neoplasm of upper-outer quadrant of left breast in female, estrogen receptor positive ([place])   Arteriovenous graft for hemodialysis in place, primary   Iron deficiency   1-Acute hypoxic respiratory failure; In the setting of  pulmonary edema In the setting of pulmonary edema, volume overload in the setting of CKD approaching ESRD -Presents with elevated BNP chest x-ray with pulmonary edema mild elevation of troponins -This likely pneumonia but due to elevated procalcitonin we will do 5 days of ceftriaxone and azithromycin -On IV Lasix  to 60 mg IV twice daily -Strict I's and O's Urine out put 1.3L.  Cr increase to 5.  On IV ceftriaxone and Zithromax to cover for PNA due to elevated procalcitonin  2-CKD stage V; Creatinine range 4.7--5.7 Patient currently declining dialysis Palliative care consulted Monitor on IV Lasix Urine out put 1.3L Cr increase to 5.  Patient continue to decline HD, even if her symptoms get worse. Considering home with hospice.   3-Status post AV graft/hematoma: Vascular  consulted  Hold Eliquis Vascular to discussed with patient if any benefit from procedure, now patient is considering comfort care.   4-Elevation of troponin Likely demand ischemia in the setting of pulmonary edema  5-Postsurgical hypothyroidism: Continue with Synthroid  6-Diabetes type 2: Continue with a sliding scale insulin  7-Hypertension: Continue with  Imdur and carvedilol Hold Norvasc due to soft blood pressure  8-Paroxysmal A-fib RVR Eliquis held due to anemia, will await vascular evaluation tomorrow in anticipation of any procedure Continue with carvedilol  9-Thrombocytopenia: Chronic thrombocytopenia for at least for the last 4 years Monitor  Acute on chronic diastolic Heart failure exacerbation in setting CKD progression.  On IV lasix.   Estimated body mass index is 32.77 kg/m as calculated from the following:   Height as of this encounter: '5\' 3"'$  (1.6 m).   Weight as of this encounter: 83.9 kg.   DVT prophylaxis: SCDs Code Status: DNR Family Communication: Care discussed with daughter at bedside. evaluation Disposition Plan:  Status is: Inpatient Remains inpatient appropriate because: Pulmonary edema     Consultants:  Nephrology  Procedures:    Antimicrobials:    Subjective: She is breathing better, dyspnea improved.   Objective: Vitals:   04/24/22 0730 04/24/22 0844 04/24/22 1208 04/24/22 1511  BP: Marland Kitchen)  151/99 122/73 (!) 129/50 125/88  Pulse: (!) 107 82 80 99  Resp: '19 17 18  17  '$ Temp:  98 F (36.7 C) 98.1 F (36.7 C) 98 F (36.7 C)  TempSrc:   Oral   SpO2: 92%  94%   Weight:      Height:        Intake/Output Summary (Last 24 hours) at 04/24/2022 1641 Last data filed at 04/24/2022 1130 Gross per 24 hour  Intake 370 ml  Output 1600 ml  Net -1230 ml   Filed Weights   04/22/22 1156  Weight: 83.9 kg    Examination:  General exam: NAD Lungs; BL crackles.  Cardiovascular system: S 1, S 2 RRR Gastrointestinal system: BS present, soft, nt Central nervous system: alert  Extremities: Trace edema   Data Reviewed: I have personally reviewed following labs and imaging studies  CBC: Recent Labs  Lab 04/22/22 1201 04/23/22 0612 04/24/22 0900  WBC 9.0 9.2 6.0  NEUTROABS 7.0  --   --   HGB 8.7* 7.5* 8.0*  HCT 29.2* 24.4* 25.1*  MCV 96.7 94.9 93.3  PLT 112* 94* 100*    Basic Metabolic Panel: Recent Labs  Lab 04/22/22 1201 04/23/22 0612 04/24/22 0900  NA 141 142 141  K 4.1 4.2 3.7  CL 108 110 109  CO2 '22 24 23  '$ GLUCOSE 149* 149* 124*  BUN 36* 41* 47*  CREATININE 5.03* 5.22* 5.64*  CALCIUM 9.3 9.2 9.1    GFR: Estimated Creatinine Clearance: 8.3 mL/min (A) (by C-G formula based on SCr of 5.64 mg/dL (H)). Liver Function Tests: No results for input(s): "AST", "ALT", "ALKPHOS", "BILITOT", "PROT", "ALBUMIN" in the last 168 hours. No results for input(s): "LIPASE", "AMYLASE" in the last 168 hours. No results for input(s): "AMMONIA" in the last 168 hours. Coagulation Profile: No results for input(s): "INR", "PROTIME" in the last 168 hours. Cardiac Enzymes: No results for input(s): "CKTOTAL", "CKMB", "CKMBINDEX", "TROPONINI" in the last 168 hours. BNP (last 3 results) No results for input(s): "PROBNP" in the last 8760 hours. HbA1C: Recent Labs    04/23/22 0612  HGBA1C 5.8*   CBG: Recent Labs  Lab 04/23/22 1640 04/23/22 2141 04/24/22 0809 04/24/22 1209 04/24/22 1617  GLUCAP 131* 116* 87 134* 119*    Lipid Profile: No  results for input(s): "CHOL", "HDL", "LDLCALC", "TRIG", "CHOLHDL", "LDLDIRECT" in the last 72 hours. Thyroid Function Tests: No results for input(s): "TSH", "T4TOTAL", "FREET4", "T3FREE", "THYROIDAB" in the last 72 hours. Anemia Panel: No results for input(s): "VITAMINB12", "FOLATE", "FERRITIN", "TIBC", "IRON", "RETICCTPCT" in the last 72 hours. Sepsis Labs: Recent Labs  Lab 04/23/22 0612 04/24/22 0900  PROCALCITON 5.28 7.92     Recent Results (from the past 240 hour(s))  SARS Coronavirus 2 by RT PCR (hospital order, performed in [facility] hospital lab) *cepheid single result test* Anterior Nasal Swab     Status: None   Collection Time: 04/22/22  6:39 PM   Specimen: Anterior Nasal Swab  Result Value Ref Range Status   SARS Coronavirus 2 by RT PCR NEGATIVE NEGATIVE Final    Comment: (NOTE) SARS-CoV-2 target nucleic acids are NOT DETECTED.  The SARS-CoV-2 RNA is generally detectable in upper and lower respiratory specimens during the acute phase of infection. The lowest concentration of SARS-CoV-2 viral copies this assay can detect is 250 copies / mL. A negative result does not preclude SARS-CoV-2 infection and should not be used as the sole basis for treatment or other patient management decisions.  A negative  result may occur with improper specimen collection / handling, submission of specimen other than nasopharyngeal swab, presence of viral mutation(s) within the areas targeted by this assay, and inadequate number of viral copies (<250 copies / mL). A negative result must be combined with clinical observations, patient history, and epidemiological information.  Fact Sheet for Patients:   https://www.patel.info/  Fact Sheet for Healthcare Providers: https://hall.com/  This test is not yet approved or  cleared by the [place] FDA and has been authorized for detection and/or diagnosis of SARS-CoV-2 by FDA under an Emergency  Use Authorization (EUA).  This EUA will remain in effect (meaning this test can be used) for the duration of the COVID-19 declaration under Section 564(b)(1) of the Act, 21 U.S.C. section 360bbb-3(b)(1), unless the authorization is terminated or revoked sooner.  Performed at [facility], [address] [place], [place] [address]          Radiology Studies: No results found.      Scheduled Meds:  allopurinol  100 mg Oral Daily   anastrozole  1 mg Oral Daily   carvedilol  12.5 mg Oral BID WC   furosemide  60 mg Intravenous BID   heparin  5,000 Units Subcutaneous Q8H   insulin aspart  0-5 Units Subcutaneous QHS   insulin aspart  0-6 Units Subcutaneous TID WC   isosorbide dinitrate  20 mg Oral Daily   levothyroxine  88 mcg Oral Q0600   linagliptin  5 mg Oral Daily   pentoxifylline  400 mg Oral Daily   Continuous Infusions:  azithromycin 500 mg (04/24/22 1019)   cefTRIAXone (ROCEPHIN)  IV Stopped (04/23/22 1039)     LOS: 2 days    Time spent: 35 minutes    Mylinda Brook A Yeudiel Mateo, MD [facility]   If 7PM-7AM, please contact night-coverage [email or web address]  04/24/2022, 4:41 PM

## 2022-04-24 NOTE — Progress Notes (Signed)
[facility]  PROGRESS NOTE   Subjective:   Patient seen resting quietly, daughter at bedside Alert and oriented Daughter states patient has decided she does not want to consider dialysis if/when needed.  Currently speaking with palliative care and considering hospice.  Objective:  Vital signs: Blood pressure (!) 129/50, pulse 80, temperature 98.1 F (36.7 C), temperature source Oral, resp. rate 18, height '5\' 3"'$  (1.6 m), weight 83.9 kg, SpO2 94 %.  Intake/Output Summary (Last 24 hours) at 04/24/2022 1502 Last data filed at 04/24/2022 1130 Gross per 24 hour  Intake 370 ml  Output 1600 ml  Net -1230 ml   Filed Weights   04/22/22 1156  Weight: 83.9 kg     Physical Exam: General:  No acute distress  Head:  Normocephalic, atraumatic. Moist oral mucosal membranes  Eyes:  Anicteric  Lungs:   Clear to auscultation, normal effort  Heart:  S1S2 no rubs  Abdomen:   Soft, nontender, bowel sounds present  Extremities: 2+ peripheral edema.  Neurologic:  Awake, alert, following commands  Skin:  No lesions  Access: Right AVG    Basic Metabolic Panel: Recent Labs  Lab 04/22/22 1201 04/23/22 0612 04/24/22 0900  NA 141 142 141  K 4.1 4.2 3.7  CL 108 110 109  CO2 '22 24 23  '$ GLUCOSE 149* 149* 124*  BUN 36* 41* 47*  CREATININE 5.03* 5.22* 5.64*  CALCIUM 9.3 9.2 9.1     CBC: Recent Labs  Lab 04/22/22 1201 04/23/22 0612 04/24/22 0900  WBC 9.0 9.2 6.0  NEUTROABS 7.0  --   --   HGB 8.7* 7.5* 8.0*  HCT 29.2* 24.4* 25.1*  MCV 96.7 94.9 93.3  PLT 112* 94* 100*      Urinalysis: No results for input(s): "COLORURINE", "LABSPEC", "PHURINE", "GLUCOSEU", "HGBUR", "BILIRUBINUR", "KETONESUR", "PROTEINUR", "UROBILINOGEN", "NITRITE", "LEUKOCYTESUR" in the last 72 hours.  Invalid input(s): "APPERANCEUR"    Imaging: No results found.   Medications:    azithromycin 500 mg (04/24/22 1019)   cefTRIAXone (ROCEPHIN)  IV Stopped (04/23/22 1039)    allopurinol  100  mg Oral Daily   anastrozole  1 mg Oral Daily   carvedilol  12.5 mg Oral BID WC   furosemide  60 mg Intravenous BID   heparin  5,000 Units Subcutaneous Q8H   insulin aspart  0-5 Units Subcutaneous QHS   insulin aspart  0-6 Units Subcutaneous TID WC   isosorbide dinitrate  20 mg Oral Daily   levothyroxine  88 mcg Oral Q0600   linagliptin  5 mg Oral Daily   pentoxifylline  400 mg Oral Daily    Assessment/ Plan:     Principal Problem:   Acute hypoxemic respiratory failure (HCC) Active Problems:   Post-surgical hypothyroidism   OA (osteoarthritis)   Type 2 diabetes mellitus with other specified complication (HCC)   HTN (hypertension)   GERD (gastroesophageal reflux disease)   Diabetic retinopathy with macular edema (HCC)   Elevated troponin   Paroxysmal atrial fibrillation with rapid ventricular response (HCC)   Thrombocytopenia (HCC)   Anemia due to stage 5 chronic kidney disease, 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)   Malignant neoplasm of upper-outer quadrant of left breast in female, estrogen receptor positive ([place])   Arteriovenous graft for hemodialysis in place, primary   Iron deficiency  81 y.o. female with a PMHx of hypertension, coronary artery disease, congestive heart failure, diabetes, history of cerebrovascular accident, GERD, atrial fibrillation, chronic kidney disease with anemia.  She has CKD stage IV/V and  had an AV graft placement on the right arm recently.  She developed tingling and numbness along with hematoma.  She requested the vascular surgery to remove the AV graft and also drain the hematoma.  While patient is being scheduled for the same she developed shortness of breath, leg edema and was found to have decreased oxygen saturation in the emergency room.   #1: Chronic kidney disease stage V: Patient has been in stage V kidney disease for multiple years.  After discussing with patient and her daughter, patient confirms she does not wish to proceed with dialysis.   Patient and daughter encouraged to continue speaking with hospice for needed support.   #2: S/p AV graft/hematoma: Patient is scheduled for removal of the AV graft and draining of hematoma.  Will defer to vascular and primary team due to patient refusal for dialysis and wished for hospice, may defer treatment.   #3: CHF/fluid overload: Advised patient on importance of fluid restriction.  Currently on Lasix 60 mg twice daily IV.  Discussed with family that fluid can be managed with oral medications once discharged.   #4: Anemia with chronic kidney disease: Hemoglobin 8.0, below desired target.   #5: Hypertension with chronic kidney disease: Continue carvedilol and amlodipine at the present doses.   #6: Diabetes mitis type II with chronic kidney disease: Currently on Tradjenta daily.     LOS: 2 [facility] 10/23/20233:02 PM

## 2022-04-24 NOTE — Telephone Encounter (Signed)
[place] Night - Client TELEPHONE ADVICE RECORD [facility] Patient Name: Alexis Tucker Gender: Female DOB: 08-Sep-1940 Age: 81 Y 1 M 7 D Return Phone Number: [phone] (Primary) Address: City/ State/ Zip: [place] [address] Client [place] Night - Client Client Site [place] Provider Alexis Tucker - MD Contact Type Call Who Is Calling Patient / Member / Family / Caregiver Call Type Triage / Clinical Caller Name Alexis Tucker Relationship To Patient Daughter Return Phone Number [phone] (Primary) Chief Complaint BREATHING - fast, heavy or wheezing Reason for Call Symptomatic / Request for [name] states she spoke with a nurse last night because he mother oxygen level was 90. It is now 77 and her pulse rate is 122. She is breathing faster. Translation No Nurse Assessment Nurse: Alexis Robins, RN, Alexis Comment Date/Time (Eastern Time): 04/22/2022 10:18:52 AM Confirm and document reason for call. If symptomatic, describe symptoms. ---Caller states her mother's O2% is 77 and her pulse rate is 122. She is breathing faster. States she has medical POA for her mom. Does the patient have any new or worsening symptoms? ---Yes Will a triage be completed? ---Yes Related visit to physician within the last 2 weeks? ---No Does the PT have any chronic conditions? (i.e. diabetes, asthma, this includes High risk factors for pregnancy, etc.) ---Yes List chronic conditions. ---type 2 diabetes, stage V kidney disease- declined dialysis. Is this a behavioral health or substance abuse call? ---No Guidelines Guideline Title Affirmed Question Affirmed Notes Nurse Date/Time (Eastern Time) Breathing Difficulty Oxygen level (e.g., pulse oximetry) 90 percent or lower [name], RN, Alexis Comment 04/22/2022 10:19:50 AM Disp. Time Alexis Tucker Time) Disposition Final  User 04/22/2022 10:17:34 AM Send to Urgent Alexis Tucker, Alexis Tucker PLEASE NOTE: All timestamps contained within this report are represented as [place] Standard Time. CONFIDENTIALTY NOTICE: This fax transmission is intended only for the addressee. It contains information that is legally privileged, confidential or otherwise protected from use or disclosure. If you are not the intended recipient, you are strictly prohibited from reviewing, disclosing, copying using or disseminating any of this information or taking any action in reliance on or regarding this information. If you have received this fax in error, please notify us immediately by telephone so that we can arrange for its return to [place]. Phone: [phone], Toll-Free: [phone], Fax: [phone] Page: 2 of 2 Call Id: [id number] [place]. Time Alexis Tucker Time) Disposition Final User 04/22/2022 10:22:41 AM Go to ED Now Yes [name], RN, Alexis Comment Final Disposition 04/22/2022 10:22:41 AM Go to ED Now Yes [name], RN, Alexis Tucker Disagree/Comply Comply Caller Understands Yes PreDisposition InappropriateToAsk Care Advice Given Per Guideline GO TO ED NOW: * You need to be seen in the Emergency Department. * Go to the ED at ___________ [place] now. Drive carefully. BRING MEDICINES: * Bring a list of your current medicines when you go to the Emergency Department (ER). NOTE TO TRIAGER - DRIVING: * Another adult should drive. CALL EMS 911 IF: * Call EMS if you become worse. CARE ADVICE given per Breathing Difficulty (Adult) guideline. Referrals [place]

## 2022-04-24 NOTE — Telephone Encounter (Signed)
Per chart review tab pt was admitted to [facility] on 04/22/22.sending note to Dr Danise Mina.

## 2022-04-24 NOTE — Telephone Encounter (Signed)
Noted thank you

## 2022-04-24 NOTE — Telephone Encounter (Signed)
[place] Night - Client TELEPHONE ADVICE RECORD [facility] Patient Name: Alexis Tucker Gender: Female DOB: [birth year] Age: 81 Y 1 M 6 D Return Phone Number: [phone] (Primary) Address: City/ State/ Zip: [place] [address] Client [place] Night - Client Client Site [place] Provider Ria Bush - MD Contact Type Call Who Is Calling Patient / Member / Family / Caregiver Call Type Triage / Clinical Caller Name Caela Huot Relationship To Patient Daughter Return Phone Number [phone] (Primary) Chief Complaint Heart palpitations or irregular heartbeat Reason for Call Symptomatic / Request for [place] states her mother's oxygen level is at 90 and pulse is 84. Translation No Nurse Assessment Nurse: Dawna Part, RN, Jarrett Soho Date/Time (Eastern Time): 04/21/2022 5:50:04 PM Confirm and document reason for call. If symptomatic, describe symptoms. ---Caller reports patient has low o2, 90 RA. swollen ankles. Reports patient should be on dialysis but decided not to. Does the patient have any new or worsening symptoms? ---Yes Will a triage be completed? ---Yes Related visit to physician within the last 2 weeks? ---No Does the PT have any chronic conditions? (i.e. diabetes, asthma, this includes High risk factors for pregnancy, etc.) ---Yes List chronic conditions. ---DM II, HTN, Stage 5 kidney disease Is this a behavioral health or substance abuse call? ---No Guidelines Guideline Title Affirmed Question Affirmed Notes Nurse Date/Time [name] Time) Leg Swelling and Edema [1] MILD swelling of both ankles (i.e., pedal edema) AND [2] new-onset or worsening [place]e Jude 04/21/2022 5:52:21 PM Disp. Time [name] 04/21/2022 5:56:53 PM SEE PCP WITHIN 3 DAYS Yes Riddle, RN, Jarrett Soho PLEASE NOTE: All timestamps  contained within this report are represented as [place] Standard Time. CONFIDENTIALTY NOTICE: This fax transmission is intended only for the addressee. It contains information that is legally privileged, confidential or otherwise protected from use or disclosure. If you are not the intended recipient, you are strictly prohibited from reviewing, disclosing, copying using or disseminating any of this information or taking any action in reliance on or regarding this information. If you have received this fax in error, please notify us immediately by telephone so that we can arrange for its return to [place]. Phone: [phone], Toll-Free: [phone], Fax: [phone] Page: 2 of 2 Call Id: [id number] Final Disposition 04/21/2022 5:56:53 PM SEE PCP WITHIN 3 DAYS Yes Dawna Part, RN, Hayes Ludwig Disagree/Comply Comply Caller Understands Yes PreDisposition Call Doctor Care Advice Given Per Guideline SEE PCP WITHIN 3 DAYS: * You need to be seen within 2 or 3 days. * PCP VISIT: Call your doctor (or NP/PA) during regular office hours and make an appointment. A clinic or urgent care center are good places to go for care if your doctor's office is closed or you can't get an appointment. NOTE: If office will be open tomorrow, tell caller to call then, not in 3 days. CARE ADVICE given per Leg Swelling and Edema (Adult) guideline. * Swelling becomes worse CALL BACK IF: * Calf pain occurs and becomes constant * You become wors

## 2022-04-24 NOTE — Consult Note (Addendum)
Consultation Note Date: 04/24/2022   Patient Name: Alexis Tucker  DOB: 02-06-1941  MRN: [id number]  Age / Sex: 81 y.o., female  PCP: Ria Bush, MD Referring Physician: Elmarie Shiley, MD  Reason for Consultation: Establishing goals of care  HPI/Patient Profile: 81 y.o. female  with past medical history of OSA, type II, HTN, chronic back pain, history of colon cancer with hysterectomy, CVA, hypothyroidism, anemia of chronic disease, CKD (stage), A-fib (Eliquis), left breast cancer (ER positive), and AV graft placement admitted on 04/22/2022 with shortness of breath and bilateral lower extremity edema.  Patient is being treated for acute hypoxic respiratory failure.  Nephrology has been following and patient continues to decline dialysis.  PMT was consulted to discuss goals of care.   Clinical Assessment and Goals of Care: I have reviewed medical records including EPIC notes, labs and imaging, assessed the patient and then met with patient and her daughter Alexis Tucker) to discuss diagnosis prognosis, GOC, EOL wishes, disposition and options.  During our discussion, Dr. Sabra Heck joined  I introduced Palliative Medicine as specialized medical care for people living with serious illness. It focuses on providing relief from the symptoms and stress of a serious illness. The goal is to improve quality of life for both the patient and the family.  We discussed a brief life review of the patient.  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  In retirement, she enjoyed traveling and [place].  She lost her daughter in December 2022 and her only other child is Alexis Tucker), who is heavily involved in her care.  We discussed patient's current illness and what it means in the larger context of patient's on-going co-morbidities.  ESRD and hemodialysis discussed in detail.     I attempted to elicit values and goals of care important to the patient. Patient continues to confirm that she has made the decision that she does not want dialysis.  When pressed about why she is declining dialysis at this time, patient decided she is not wanting to change her diet and that the time spent in dialysis will be too disruptive to her everyday life.  Quality of life versus quantity of life reviewed.  Human mortality discussed.  In light of choosing to decline HD, natural disease trajectory and expectations at EOL were discussed.  Comfort care, hospice at home, and hospice inpatient unit discussed in detail with patient and daughter.  Therapeutic lifestyle, active listening, and emotional support provided.  Daughter would like to speak with nephrology prior to shift goals of care. She would also like to speak with surgery to consider whether procedure to remove hematoma would be appropriate for her mother at this time.   Discussed with patient/family the importance of continued conversation with family and the medical providers regarding overall plan of care and treatment options, ensuring decisions are within the context of the patient's values and GOCs.    Questions and concerns were addressed. The family was encouraged to call with questions or  concerns.   PMT will continue to follow the patient throughout her hospitalization.  Afternoon update:  Received secure chat from RN that patient would like to go home with hospice as soon as possible. TOC and attending made aware of patient's wishes so that choice of hospice agency can be given.   Primary Decision Maker PATIENT  Physical Exam Vitals reviewed.  Constitutional:      General: She is not in acute distress.    Appearance: Normal appearance. She is not toxic-appearing.  HENT:     Mouth/Throat:     Mouth: Mucous membranes are moist.  Eyes:     Pupils: Pupils are equal, round, and reactive to light.  Cardiovascular:      Rate and Rhythm: Normal rate.     Pulses: Normal pulses.  Pulmonary:     Effort: Pulmonary effort is normal.  Musculoskeletal:     Comments: Generalized weakness  Neurological:     Mental Status: She is alert and oriented to person, place, and time.  Psychiatric:        Mood and Affect: Mood normal.        Behavior: Behavior normal.        Thought Content: Thought content normal.        Judgment: Judgment normal.     Palliative Assessment/Data: 40%     Thank you for this consult. Palliative medicine will continue to follow and assist holistically.   Time Total: 75 minutes Greater than 50%  of this time was spent counseling and coordinating care related to the above assessment and plan.  Signed by: Jordan Hawks, DNP, FNP-BC Palliative Medicine    Please contact Palliative Medicine Team phone at [phone] for questions and concerns.  For individual provider: See Shea Evans

## 2022-04-24 NOTE — ED Notes (Signed)
Pt sleeping, resps unlabored.  

## 2022-04-24 NOTE — ED Notes (Signed)
Glucose = 87

## 2022-04-24 NOTE — ED Notes (Signed)
Waiting on synthroid from pharmacy.

## 2022-04-24 NOTE — ED Notes (Signed)
Report to les, [id number].

## 2022-04-25 ENCOUNTER — Encounter: Payer: Self-pay | Admitting: Internal Medicine

## 2022-04-25 ENCOUNTER — Encounter: Payer: Self-pay | Admitting: Family Medicine

## 2022-04-25 DIAGNOSIS — J9601 Acute respiratory failure with hypoxia: Secondary | ICD-10-CM | POA: Diagnosis not present

## 2022-04-25 DIAGNOSIS — R0602 Shortness of breath: Secondary | ICD-10-CM

## 2022-04-25 DIAGNOSIS — E8770 Fluid overload, unspecified: Secondary | ICD-10-CM

## 2022-04-25 DIAGNOSIS — N185 Chronic kidney disease, stage 5: Secondary | ICD-10-CM | POA: Diagnosis not present

## 2022-04-25 LAB — GLUCOSE, CAPILLARY
Glucose-Capillary: 98 mg/dL (ref 70–99)
Glucose-Capillary: 135 mg/dL — ABNORMAL HIGH (ref 70–99)

## 2022-04-25 LAB — BASIC METABOLIC PANEL
Anion gap: 9 (ref 5–15)
BUN: 45 mg/dL — ABNORMAL HIGH (ref 8–23)
CO2: 25 mmol/L (ref 22–32)
Calcium: 8.8 mg/dL — ABNORMAL LOW (ref 8.9–10.3)
Chloride: 108 mmol/L (ref 98–111)
Creatinine, Ser: 5.6 mg/dL — ABNORMAL HIGH (ref 0.44–1.00)
GFR, Estimated: 7 mL/min — ABNORMAL LOW (ref >60)
Glucose, Bld: 100 mg/dL — ABNORMAL HIGH (ref 70–99)
Potassium: 3.5 mmol/L (ref 3.5–5.1)
Sodium: 142 mmol/L (ref 135–145)

## 2022-04-25 MED ORDER — TORSEMIDE 20 MG PO TABS
60.0000 mg | ORAL_TABLET | Freq: Every day | ORAL | Status: DC
  Administered 2022-04-25: 60 mg via ORAL
  Filled 2022-04-25: qty 3

## 2022-04-25 MED ORDER — ONDANSETRON HCL 4 MG PO TABS: 4.0000 mg | ORAL_TABLET | Freq: Four times a day (QID) | ORAL | 0 refills | Status: DC | PRN

## 2022-04-25 MED ORDER — CEFDINIR 300 MG PO CAPS: 300.0000 mg | ORAL_CAPSULE | Freq: Every day | ORAL | 0 refills | Status: AC

## 2022-04-25 MED ORDER — SENNOSIDES-DOCUSATE SODIUM 8.6-50 MG PO TABS: 1.0000 | ORAL_TABLET | Freq: Every evening | ORAL | 0 refills | Status: DC | PRN

## 2022-04-25 MED ORDER — CEFDINIR 300 MG PO CAPS: 300.0000 mg | ORAL_CAPSULE | Freq: Two times a day (BID) | ORAL | 0 refills | Status: DC

## 2022-04-25 MED ORDER — TORSEMIDE 60 MG PO TABS: 60.0000 mg | ORAL_TABLET | Freq: Every day | ORAL | 1 refills | Status: DC

## 2022-04-25 NOTE — TOC Transition Note (Signed)
Transition of Care [facility]) - CM/SW Discharge Note   Patient Details  Name: Akshaya Toepfer MRN: [id number] Date of Birth: 1940/12/23  Transition of Care [facility]) CM/SW Contact:  Candie Chroman, LCSW Phone Number: 04/25/2022, 12:34 PM   Clinical Narrative:  Patient has orders to discharge home today. [facility] has ordered DME. Patient can go once portable oxygen delivered to the room and home oxygen delivered to the house. RN is aware. No further concerns. CSW signing off.   Final next level of care: Home w Hospice Care Barriers to Discharge: Barriers Resolved   Patient Goals and CMS Choice     Choice offered to / list presented to : Adult Children  Discharge Placement                Patient to be transferred to facility by: Daughter Name of family member notified: Shia Delaine Patient and family notified of of transfer: 04/25/22  Discharge Plan and Services     Post Acute Care Choice: Hospice                               Social Determinants of Health (SDOH) Interventions     Readmission Risk Interventions     No data to display

## 2022-04-25 NOTE — Plan of Care (Signed)

## 2022-04-25 NOTE — Care Management Important Message (Signed)
Important Message  Patient Details  Name: Alexis Tucker MRN: [id number] Date of Birth: 11/24/1940   Medicare Important Message Given:  Yes     Dannette Barbara 04/25/2022, 11:43 AM

## 2022-04-25 NOTE — TOC Initial Note (Addendum)
Transition of Care [facility]) - Initial/Assessment Note    Patient Details  Name: Alexis Tucker MRN: [id number] Date of Birth: 09-01-1940  Transition of Care [facility]) CM/SW Contact:    Candie Chroman, LCSW Phone Number: 04/25/2022, 10:17 AM  Clinical Narrative: CSW met with patient. No supports at bedside. CSW introduced role and explained that discharge planning would be discussed. Patient confirmed preference for home hospice services. Gave agency list. She will review with daughter. Patient confirmed she is not on oxygen at home. She is currently on 4 L. Called daughter to provide update. She will review agency list when she arrives in about 15 minutes and call CSW with preference. She asked for [facility] and oxygen. Daughter planning on transporting daughter home by car so will need oxygen tank for transport.              10:58 am: Daughter chose [employer]. Left vm for liaison. Patient already has a cane, RW, and transport chair at home.  Expected Discharge Plan: [place] [facility] Barriers to Discharge: Other (must enter comment) (Hospice evaluation, DME delivery)   Patient Goals and CMS Choice     Choice offered to / list presented to : Patient, Adult Children  Expected Discharge Plan and Services Expected Discharge Plan: [place] Acute Care Choice: Hospice Living arrangements for the past 2 months: Single Family Home Expected Discharge Date: 04/25/22                                    Prior Living Arrangements/Services Living arrangements for the past 2 months: Single Family Home   Patient language and need for interpreter reviewed:: Yes Do you feel safe going back to the place where you live?: Yes      Need for Family Participation in Patient Care: Yes (Comment) Care giver support system in place?: Yes (comment)   Criminal Activity/Legal Involvement Pertinent to Current Situation/Hospitalization: No - Comment as  needed  Activities of Daily Living Home Assistive Devices/Equipment: Gilford Rile (specify type) ADL Screening (condition at time of admission) Patient's cognitive ability adequate to safely complete daily activities?: Yes Is the patient deaf or have difficulty hearing?: No Does the patient have difficulty seeing, even when wearing glasses/contacts?: No Does the patient have difficulty concentrating, remembering, or making decisions?: No Patient able to express need for assistance with ADLs?: Yes Does the patient have difficulty dressing or bathing?: No Independently performs ADLs?: Yes (appropriate for developmental age) Does the patient have difficulty walking or climbing stairs?: No Weakness of Legs: Both Weakness of Arms/Hands: None  Permission Sought/Granted Permission sought to share information with : Facility Sport and exercise psychologist, Family Supports Permission granted to share information with : Yes, Verbal Permission Granted  Share Information with NAME: Vanellope [place]  Permission granted to share info w AGENCY: [place] granted to share info w Relationship: Daughter  Permission granted to share info w Contact Information: [phone]  Emotional Assessment Appearance:: Appears stated age Attitude/Demeanor/Rapport: Engaged, Gracious Affect (typically observed): Accepting, Appropriate, Calm, Pleasant Orientation: : Oriented to Self, Oriented to Place, Oriented to  Time, Oriented to Situation Alcohol / Substance Use: Not Applicable Psych Involvement: No (comment)  Admission diagnosis:  Shortness of breath [R06.02] Hypoxia [R09.02] Hypervolemia, unspecified hypervolemia type [E87.70] Acute hypoxemic respiratory failure ([place]) [J96.01] Patient Active Problem List   Diagnosis Date Noted   Acute hypoxemic  respiratory failure ([place]) 04/22/2022   Iron deficiency 02/20/2022   Arteriovenous graft for hemodialysis in place, primary 02/09/2022   General weakness  11/26/2021   Unintentional weight loss 09/19/2021   Secondary hyperparathyroidism of renal origin ([place]) 06/19/2020   Primary open angle glaucoma (POAG) of both eyes 06/19/2020   Malignant neoplasm of upper-outer quadrant of left breast in female, estrogen receptor positive ([place]) 12/03/2017   Osteoporosis 09/29/2017   History of arterial ischemic stroke 08/27/2017   Thrombocytopenia ([place]) 08/27/2017   Anemia due to stage 5 chronic kidney disease, not on chronic dialysis ([place]) 08/27/2017   Paroxysmal atrial fibrillation with rapid ventricular response (HCC)    Elevated troponin 05/04/2016   Assistance needed with transportation 07/31/2015   Advanced care planning/counseling discussion 07/27/2015   Lower back pain 03/14/2014   Macular degeneration    Diabetic retinopathy with macular edema ([place]) 03/03/2014   Medicare annual wellness visit, subsequent 01/07/2013   CKD stage 5 due to type 2 diabetes mellitus ([place]) 01/06/2013   Carotid stenosis, asymptomatic 12/05/2012   Post-surgical hypothyroidism    OA (osteoarthritis)    Type 2 diabetes mellitus with other specified complication (HCC)    HTN (hypertension)    History of breast cancer    History of colon cancer    GERD (gastroesophageal reflux disease)    PCP:  Ria Bush, MD Pharmacy:   [id number] - [address] [place] [address] Phone: [phone] Fax: [phone]     Social Determinants of Health (SDOH) Interventions    Readmission Risk Interventions     No data to display

## 2022-04-25 NOTE — Discharge Summary (Addendum)
Physician Discharge Summary   Patient: Alexis Tucker MRN: [id number] DOB: [birth year]  Admit date:     04/22/2022  Discharge date: 04/25/22  Discharge Physician: Alba Cory   PCP: Eustaquio Boyden, MD   Recommendations at discharge:   Discharge home with Hospice.   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 *  Hospital Course: 81 year old with past medical history significant for hypertension, hypothyroidism, non-insulin-dependent diabetes, hypertensive CKD stage V with possible progression to end-stage renal disease status post right AV graft placement by vascular, paroxysmal A-fib, left-sided breast cancer ER positive, anemia of chronic disease, iron deficiency presents complaining of shortness of breath orthopnea, bilateral lower extremity swelling.  Patient was found to be hypoxic with oxygen saturation 84 on room air, she was subsequently placed on oxygen supplementation.   Patient admitted with acute hypoxic respiratory failure secondary to pulmonary edema in the setting of AKI on CKD concern for progression to ESRD    Assessment and Plan:  1-Acute hypoxic respiratory failure; In the setting of  pulmonary edema In the setting of pulmonary edema, volume  overload in the setting of CKD approaching ESRD -Presents with elevated BNP chest x-ray with pulmonary edema mild elevation of troponins -This likely pneumonia but due to elevated procalcitonin we will do 5 days of ceftriaxone and azithromycin -Treated with IV Lasix to 60 mg IV twice daily. Plan to discharge on Torsemide.  -Strict I's and O's Urine out put 2 L.  Cr stable at 5.6 Received  IV ceftriaxone and Zithromax to cover for PNA due to elevated procalcitonin. Discharge on cefdinir for 3 days.   stable for discharge home with hospice care.   2-CKD stage V; Creatinine range 4.7--5.7 Patient currently declining dialysis Palliative care consulted Monitor on IV Lasix Urine out put 1.3L Cr increase to 5.  Patient continue to decline HD, even if her symptoms get worse. Plan to discharge home with hospice.    3-Status post AV graft/hematoma: Vascular  consulted  Plan to hold on any surgery     4-Elevation of troponin Likely demand ischemia in the setting of pulmonary edema   5-Postsurgical hypothyroidism: Continue with Synthroid   6-Diabetes type 2: Continue with a sliding scale insulin  Would stop tradjenta due to worsening renal function avoid hypoglycemia.   7-Hypertension: Continue with  Imdur and carvedilol Hold Norvasc due to soft blood pressure   8-Paroxysmal A-fib RVR Resume Eliquis Continue with carvedilol   9-Thrombocytopenia: Chronic thrombocytopenia for at least for the last 4 years Monitor   Acute on chronic diastolic Heart failure exacerbation in setting CKD progression.  Treated with  IV lasix.          Consultants: Nephrology, Vascular  Procedures performed: None Disposition: Home Diet recommendation:  Discharge Diet Orders (From admission, onward)     Start  Ordered   04/25/22 0000  Diet - low sodium heart healthy        04/25/22 0915           Cardiac diet DISCHARGE MEDICATION: Allergies as of 04/25/2022       Reactions    Tramadol Other (See Comments)   nightmares   Codeine Anxiety   Sulfa Antibiotics Itching, Rash        Medication List     STOP taking these medications    amLODipine 5 MG tablet Commonly known as: NORVASC   HYDROcodone-acetaminophen 5-325 MG tablet Commonly known as: NORCO/VICODIN   Onglyza 2.5 MG Tabs tablet Generic drug: saxagliptin HCl       TAKE these medications    [employer] Blood Glucose Meter w/Device Kit Use as directed to check sugars daily 250.42   allopurinol 100 MG tablet Commonly known as: ZYLOPRIM Take 1 tablet (100 mg total) by mouth daily.   anastrozole 1 MG tablet Commonly known as: ARIMIDEX Take 1 tablet (1 mg total) by mouth daily.   apixaban 5 MG Tabs tablet Commonly known as: ELIQUIS Take 1 tablet (5 mg total) by mouth 2 (two) times daily.   CALCIUM-MAGNESIUM PO Take 1 tablet by mouth daily.   carvedilol 25 MG tablet Commonly known as: COREG Take 12.5 mg by mouth 2 (two) times daily with a meal.   cefdinir 300 MG capsule Commonly known as: OMNICEF Take 1 capsule (300 mg total) by mouth daily for 3 days.   isosorbide dinitrate 20 MG tablet Commonly known as: ISORDIL Take 1 tablet (20 mg total) by mouth daily.   levothyroxine 88 MCG tablet Commonly known as: SYNTHROID TAKE 1 TABLET BY MOUTH EVERY DAY   ondansetron 4 MG tablet Commonly known as: ZOFRAN Take 1 tablet (4 mg total) by mouth every 6 (six) hours as needed for nausea.   pantoprazole 40 MG tablet Commonly known as: PROTONIX Take 1 tablet (40 mg total) by mouth daily as needed.   pentoxifylline 400 MG CR tablet Commonly known as: TRENTAL Take 1 tablet (400 mg total) by mouth daily.   senna-docusate 8.6-50 MG tablet Commonly known as: Senokot-S Take 1 tablet by mouth at bedtime as needed for mild constipation.   Torsemide 60 MG Tabs Take 60 mg by mouth daily.   Vitamin D-3 125 MCG (5000 UT) Tabs Take 1 tablet by mouth daily.        Discharge Exam: Filed  Weights   04/22/22 1156  Weight: 83.9 kg   General; NAD  Condition at discharge: stable  The results of significant diagnostics from this hospitalization (including imaging, microbiology, ancillary and laboratory) are listed below for reference.   Imaging Studies: DG Chest 2 View  Result Date: 04/22/2022 CLINICAL DATA:  sob EXAM: CHEST - 2 VIEW COMPARISON:  May 2023 FINDINGS: The heart appears near the upper limit of normal. No pleural effusion. No pneumothorax. Relatively low lung volumes with findings of increased vascular prominence and somewhat diffuse coarse reticular/interstitial opacities. There is a linear band of consolidation in the lower lung, best appreciated on the lateral view. Osseous structures are unremarkable. Calcifications of the aortic arch. IMPRESSION: 1. Increased pulmonary vascular and interstitial prominence, suggestive of pulmonary edema. 2. Lower lung consolidation best appreciated on the lateral view, atelectasis versus infection. Electronically Signed   By: Olive Bass M.D.   On: 04/22/2022 12:28   VAS US DUPLEX DIALYSIS ACCESS (AVF, AVG)  Result Date: 04/18/2022 DIALYSIS ACCESS Patient Name:  RAMI WADDLE  Date of Exam:   04/14/2022 Medical Rec #: [id number]     Accession #:    [id number] Date of Birth: [birth year]     Patient Gender: F Patient Age:   63 years Exam Location:  [facility] Procedure:      VAS US DUPLEX DIALYSIS ACCESS (AVF, AVG) Referring Phys: Eulogio Ditch --------------------------------------------------------------------------------  Reason for Exam: Routine follow up. History: Right AVG created 02/01/2022. Comparison Study: 03/2022 Performing Technologist: Concha Norway RVT  Examination Guidelines: A complete evaluation includes B-mode imaging, spectral Doppler, color Doppler, and power Doppler as needed of all accessible portions of each vessel. Unilateral testing is considered an integral part of a complete examination. Limited  examinations for reoccurring indications may be performed as noted.  Findings:   +--------------------+----------+-----------------+----------------------------+ AVG                 PSV (cm/s)Flow Vol (mL/min)          Describe           +--------------------+----------+-----------------+----------------------------+ Native artery inflow   197          2521                                    +--------------------+----------+-----------------+----------------------------+ Arterial anastomosis   481                      stenotic and due to Large                                                            hematoma           +--------------------+----------+-----------------+----------------------------+ Prox graft             213                                                  +--------------------+----------+-----------------+----------------------------+ Mid graft              103                                                  +--------------------+----------+-----------------+----------------------------+ Distal graft           126                                                  +--------------------+----------+-----------------+----------------------------+ Venous anastomosis     109                                                  +--------------------+----------+-----------------+----------------------------+ Venous outflow         103                                                  +--------------------+----------+-----------------+----------------------------+ +--------------+------------+----------+---------+---------+-------------------+  Diameter  Depth (cm)Branching   PSV       Flow Volume                       (cm)                        (cm/s)       (ml/min)       +--------------+------------+----------+---------+---------+-------------------+ Rt Rad Art Dis                                  32                         +--------------+------------+----------+---------+---------+-------------------+ Antegrade                                                                 +--------------+------------+----------+---------+---------+-------------------+ Rt Uln Art Dis                                  31                        +--------------+------------+----------+---------+---------+-------------------+ Antegrade                                                                 +--------------+------------+----------+---------+---------+-------------------+  Summary: Patent new right brachax AVG. The anastomosis still appears stenotic probably due to large hematoma again seen. Hematoma measures 4.67 x 4.96cm, slighlty smaller than previous exam.  *See table(s) above for measurements and observations.  Diagnosing physician: Leotis Pain MD Electronically signed by Leotis Pain MD on 04/18/2022 at 4:01:17 PM.   --------------------------------------------------------------------------------   Final     Microbiology: Results for orders placed or performed during the hospital encounter of 04/22/22  SARS Coronavirus 2 by RT PCR (hospital order, performed in [facility] hospital lab) *cepheid single result test* Anterior Nasal Swab     Status: None   Collection Time: 04/22/22  6:39 PM   Specimen: Anterior Nasal Swab  Result Value Ref Range Status   SARS Coronavirus 2 by RT PCR NEGATIVE NEGATIVE Final    Comment: (NOTE) SARS-CoV-2 target nucleic acids are NOT DETECTED.  The SARS-CoV-2 RNA is generally detectable in upper and lower respiratory specimens during the acute phase of infection. The lowest concentration of SARS-CoV-2 viral copies this assay can detect is 250 copies / mL. A negative result does not preclude SARS-CoV-2 infection and should not be used as the sole basis for treatment or other patient management decisions.  A negative result may occur with improper specimen collection / handling,  submission of specimen other than nasopharyngeal swab, presence of viral mutation(s) within the areas targeted by this assay, and inadequate number of viral copies (<250 copies / mL). A negative result must be combined with clinical observations, patient history, and epidemiological information.  Fact Sheet for Patients:  https://www.patel.info/  Fact Sheet for Healthcare Providers: https://hall.com/  This test is not yet approved or  cleared by the [place] FDA and has been authorized for detection and/or diagnosis of SARS-CoV-2 by FDA under an Emergency Use Authorization (EUA).  This EUA will remain in effect (meaning this test can be used) for the duration of the COVID-19 declaration under Section 564(b)(1) of the Act, 21 U.S.C. section 360bbb-3(b)(1), unless the authorization is terminated or revoked sooner.  Performed at [facility], [address] [place], [place] [address]     Labs: CBC: Recent Labs  Lab 04/22/22 1201 04/23/22 0612 04/24/22 0900  WBC 9.0 9.2 6.0  NEUTROABS 7.0  --   --   HGB 8.7* 7.5* 8.0*  HCT 29.2* 24.4* 25.1*  MCV 96.7 94.9 93.3  PLT 112* 94* 637*   Basic Metabolic Panel: Recent Labs  Lab 04/22/22 1201 04/23/22 0612 04/24/22 0900 04/25/22 0445  NA 141 142 141 142  K 4.1 4.2 3.7 3.5  CL 108 110 109 108  CO2 $Re'22 24 23 25  'DOh$ GLUCOSE 149* 149* 124* 100*  BUN 36* 41* 47* 45*  CREATININE 5.03* 5.22* 5.64* 5.60*  CALCIUM 9.3 9.2 9.1 8.8*   Liver Function Tests: No results for input(s): "AST", "ALT", "ALKPHOS", "BILITOT", "PROT", "ALBUMIN" in the last 168 hours. CBG: Recent Labs  Lab 04/24/22 0809 04/24/22 1209 04/24/22 1617 04/24/22 2109 04/25/22 0723  GLUCAP 87 134* 119* 147* 98    Discharge time spent: greater than 30 minutes.  Signed: Elmarie Shiley, MD [facility] 04/25/2022

## 2022-04-25 NOTE — Progress Notes (Signed)
                                                     Palliative Care Progress Note, Assessment & Plan   Patient Name: Alexis Tucker       Date: 04/25/2022 DOB: 28-Sep-1940  Age: 81 y.o. MRN#: [id number] Attending Physician: Alexis Shiley, MD Primary Care Physician: Alexis Bush, MD Admit Date: 04/22/2022  Reason for Consultation/Follow-up: Establishing goals of care  Subjective: Patient is sitting at edge of bed in no apparent distress.  Nasal cannula is in place.  She acknowledges my presence and is able to make her wishes known.  Her lunch tray arrives during her visit and she is able to feed herself.  Patient's daughter is at bedside.  Summary of counseling/coordination of care: After reviewing the patient's chart and assessing the patient at bedside, I discussed plan of care and disposition with patient and daughter.  Plan remains for patient to go home with hospice services.  Family is awaiting DME to be delivered before patient discharged.  Patient shares she has no questions or concerns about hospice services in the home.  Patient's daughter inquired about what medications would be continued or discontinued at discharge.  Patient's daughter and I reviewed discharge summary and discussed medications for home regimen.  I also reiterated that hospice will review medications with patient and daughter to ensure medications -scheduled and as needed - focused on comfort and QOL will be given.   Questions and concerns were addressed.  Goals are clear.  Plan is set.  DNR remains.  Patient is to be transferred home by daughter once oxygen is delivered both to the home and to the room.  PMT will remain available to patient and family and shadow her chart/monitor peripherally. Please re-engage if goals change, at patient/family's request, or if patient's  health deteriorates during hospitalization.  Physical Exam Vitals reviewed.  Constitutional:      Appearance: Normal appearance.  HENT:     Head: Normocephalic.     Mouth/Throat:     Mouth: Mucous membranes are moist.  Eyes:     Pupils: Pupils are equal, round, and reactive to light.  Cardiovascular:     Rate and Rhythm: Normal rate.  Pulmonary:     Effort: Pulmonary effort is normal.     Comments: New [place] in place Abdominal:     Palpations: Abdomen is soft.  Musculoskeletal:     Comments: MAETC  Neurological:     Mental Status: She is alert and oriented to person, place, and time. Mental status is at baseline.  Psychiatric:        Mood and Affect: Mood normal.        Behavior: Behavior normal.        Thought Content: Thought content normal.        Judgment: Judgment normal.             Palliative Assessment/Data: 50%    Total Time 50 minutes  Greater than 50%  of this time was spent counseling and coordinating care related to the above assessment and plan.  Thank you for allowing the Palliative Medicine Team to assist in the care of this patient.  Alexis Ilsa Iha, FNP-BC Palliative Medicine Team Team Phone # [phone]

## 2022-04-26 ENCOUNTER — Other Ambulatory Visit: Payer: Self-pay | Admitting: Family Medicine

## 2022-04-26 ENCOUNTER — Telehealth: Payer: Self-pay | Admitting: Family Medicine

## 2022-04-26 ENCOUNTER — Encounter: Payer: Self-pay | Admitting: Internal Medicine

## 2022-04-26 ENCOUNTER — Telehealth: Payer: Self-pay | Admitting: *Deleted

## 2022-04-26 MED ORDER — TORSEMIDE 20 MG PO TABS: 60.0000 mg | ORAL_TABLET | Freq: Every day | ORAL | 0 refills | Status: DC

## 2022-04-26 NOTE — Telephone Encounter (Signed)
Daughter Alexis Tucker called reporting that patient is now on hospice care , but is abot to get discharged due to her being on Anastrozole. She is on hopice care for her kidney disease. She is asking if Dr B thinks patient should continue Anastrozole or not (patient states she has been taking and is hesitant to stop it.) Please advise

## 2022-04-26 NOTE — Telephone Encounter (Addendum)
Plz notify daughter/pt new Rx sent to pharmacy for torsemide '20mg'$  to take 3 tablets ('60mg'$ ) daily

## 2022-04-26 NOTE — Telephone Encounter (Signed)
Pt daughter called in requesting a new proscription for RX torsemide 60 MG TABS . Called in to pharmacy that would be cover by her insurance .# [phone]

## 2022-04-26 NOTE — Telephone Encounter (Addendum)
Looks like [facility] sent rx for torsemide 60 mg tab to [facility] on 04/25/22, #90/1.  Spoke with pt's daughter, Santiago Glad (on dpr), asking clarification of message.  [facility] brand name rx and pt needs generic.  Santiago Glad agreed to hold while I called [facility].  [facility].  Told it shows in there system for brand name.  However, they printed hard copy which showed torsemide.  But when putting through, it kicked back as Soaanz b/c 60 mg only comes as brand name.  Pharmacist recommends sending new rx written as 20 mg tab TID.   Also, Santiago Glad wants to make Dr. Darnell Level aware the home hospice nurse visited pt yesterday and has stopped Onglyza and amlodipine.

## 2022-04-26 NOTE — Addendum Note (Signed)
Addended by: Ria Bush on: 04/26/2022 04:51 PM   Modules accepted: Orders

## 2022-04-26 NOTE — Telephone Encounter (Signed)
Opened in error

## 2022-04-26 NOTE — Progress Notes (Signed)
Patient's daughter Santiago Glad spoke to her regarding the futility of continuing anastrozole for adjuvant basis the context of her failing kidneys.  I agree with hospice.  Discontinue anastrozole. The daughter is in agreement.  [place].

## 2022-04-27 NOTE — Telephone Encounter (Signed)
Spoke with Santiago Glad relaying Dr. Synthia Innocent message.  She verbalizes understanding and expresses her thanks.

## 2022-04-27 NOTE — Telephone Encounter (Signed)
Rx for 20 mg tab, 3 tablets daily was already picked up.

## 2022-04-28 DIAGNOSIS — Z66 Do not resuscitate: Secondary | ICD-10-CM | POA: Diagnosis not present

## 2022-04-28 DIAGNOSIS — N186 End stage renal disease: Secondary | ICD-10-CM | POA: Diagnosis not present

## 2022-04-28 DIAGNOSIS — J9601 Acute respiratory failure with hypoxia: Secondary | ICD-10-CM | POA: Diagnosis not present

## 2022-04-28 DIAGNOSIS — I10 Essential (primary) hypertension: Secondary | ICD-10-CM | POA: Diagnosis not present

## 2022-04-28 DIAGNOSIS — Z515 Encounter for palliative care: Secondary | ICD-10-CM | POA: Diagnosis not present

## 2022-04-28 DIAGNOSIS — K219 Gastro-esophageal reflux disease without esophagitis: Secondary | ICD-10-CM | POA: Diagnosis not present

## 2022-04-28 DIAGNOSIS — M1909 Primary osteoarthritis, other specified site: Secondary | ICD-10-CM | POA: Diagnosis not present

## 2022-04-28 DIAGNOSIS — I480 Paroxysmal atrial fibrillation: Secondary | ICD-10-CM | POA: Diagnosis not present

## 2022-04-28 DIAGNOSIS — E118 Type 2 diabetes mellitus with unspecified complications: Secondary | ICD-10-CM | POA: Diagnosis not present

## 2022-04-28 DIAGNOSIS — C50912 Malignant neoplasm of unspecified site of left female breast: Secondary | ICD-10-CM | POA: Diagnosis not present

## 2022-05-01 ENCOUNTER — Telehealth: Payer: Self-pay | Admitting: *Deleted

## 2022-05-01 ENCOUNTER — Encounter (INDEPENDENT_AMBULATORY_CARE_PROVIDER_SITE_OTHER): Payer: Self-pay

## 2022-05-01 DIAGNOSIS — E118 Type 2 diabetes mellitus with unspecified complications: Secondary | ICD-10-CM | POA: Diagnosis not present

## 2022-05-01 DIAGNOSIS — I10 Essential (primary) hypertension: Secondary | ICD-10-CM | POA: Diagnosis not present

## 2022-05-01 DIAGNOSIS — N186 End stage renal disease: Secondary | ICD-10-CM | POA: Diagnosis not present

## 2022-05-01 DIAGNOSIS — C50912 Malignant neoplasm of unspecified site of left female breast: Secondary | ICD-10-CM | POA: Diagnosis not present

## 2022-05-01 DIAGNOSIS — J9601 Acute respiratory failure with hypoxia: Secondary | ICD-10-CM | POA: Diagnosis not present

## 2022-05-01 DIAGNOSIS — K219 Gastro-esophageal reflux disease without esophagitis: Secondary | ICD-10-CM | POA: Diagnosis not present

## 2022-05-01 NOTE — Telephone Encounter (Signed)
Patient's daughter called to cancel her appointment patient has been admitted to Hospice.

## 2022-05-02 DIAGNOSIS — I10 Essential (primary) hypertension: Secondary | ICD-10-CM | POA: Diagnosis not present

## 2022-05-02 DIAGNOSIS — J9601 Acute respiratory failure with hypoxia: Secondary | ICD-10-CM | POA: Diagnosis not present

## 2022-05-02 DIAGNOSIS — N186 End stage renal disease: Secondary | ICD-10-CM | POA: Diagnosis not present

## 2022-05-02 DIAGNOSIS — E118 Type 2 diabetes mellitus with unspecified complications: Secondary | ICD-10-CM | POA: Diagnosis not present

## 2022-05-02 DIAGNOSIS — C50912 Malignant neoplasm of unspecified site of left female breast: Secondary | ICD-10-CM | POA: Diagnosis not present

## 2022-05-02 DIAGNOSIS — K219 Gastro-esophageal reflux disease without esophagitis: Secondary | ICD-10-CM | POA: Diagnosis not present

## 2022-05-03 DIAGNOSIS — J9601 Acute respiratory failure with hypoxia: Secondary | ICD-10-CM | POA: Diagnosis not present

## 2022-05-03 DIAGNOSIS — I480 Paroxysmal atrial fibrillation: Secondary | ICD-10-CM | POA: Diagnosis not present

## 2022-05-03 DIAGNOSIS — M1909 Primary osteoarthritis, other specified site: Secondary | ICD-10-CM | POA: Diagnosis not present

## 2022-05-03 DIAGNOSIS — Z515 Encounter for palliative care: Secondary | ICD-10-CM | POA: Diagnosis not present

## 2022-05-03 DIAGNOSIS — E118 Type 2 diabetes mellitus with unspecified complications: Secondary | ICD-10-CM | POA: Diagnosis not present

## 2022-05-03 DIAGNOSIS — I10 Essential (primary) hypertension: Secondary | ICD-10-CM | POA: Diagnosis not present

## 2022-05-03 DIAGNOSIS — K219 Gastro-esophageal reflux disease without esophagitis: Secondary | ICD-10-CM | POA: Diagnosis not present

## 2022-05-03 DIAGNOSIS — N186 End stage renal disease: Secondary | ICD-10-CM | POA: Diagnosis not present

## 2022-05-03 DIAGNOSIS — C50912 Malignant neoplasm of unspecified site of left female breast: Secondary | ICD-10-CM | POA: Diagnosis not present

## 2022-05-03 DIAGNOSIS — Z66 Do not resuscitate: Secondary | ICD-10-CM | POA: Diagnosis not present

## 2022-05-04 ENCOUNTER — Telehealth: Payer: Self-pay | Admitting: Cardiology

## 2022-05-04 MED ORDER — CARVEDILOL 25 MG PO TABS: 12.5000 mg | ORAL_TABLET | Freq: Two times a day (BID) | ORAL | 2 refills | Status: DC

## 2022-05-04 NOTE — Telephone Encounter (Signed)
Requested Prescriptions   Signed Prescriptions Disp Refills   carvedilol (COREG) 25 MG tablet 30 tablet 2    Sig: Take 0.5 tablets (12.5 mg total) by mouth 2 (two) times daily with a meal.    Authorizing Provider: Kate Sable    Ordering User: NEWCOMER MCCLAIN, Japheth Diekman L

## 2022-05-04 NOTE — Telephone Encounter (Signed)
*  STAT* If patient is at the pharmacy, call can be transferred to refill team.   1. Which medications need to be refilled? (please list name of each medication and dose if known) carvedilol (COREG) 25 MG tablet   2. Which pharmacy/location (including street and city if local pharmacy) is medication to be sent to?  [id number] [place] [place] - [address]     3. Do they need a 30 day or 90 day supply? 30 day

## 2022-05-08 DIAGNOSIS — N186 End stage renal disease: Secondary | ICD-10-CM | POA: Diagnosis not present

## 2022-05-08 DIAGNOSIS — E118 Type 2 diabetes mellitus with unspecified complications: Secondary | ICD-10-CM | POA: Diagnosis not present

## 2022-05-08 DIAGNOSIS — C50912 Malignant neoplasm of unspecified site of left female breast: Secondary | ICD-10-CM | POA: Diagnosis not present

## 2022-05-08 DIAGNOSIS — J9601 Acute respiratory failure with hypoxia: Secondary | ICD-10-CM | POA: Diagnosis not present

## 2022-05-08 DIAGNOSIS — I10 Essential (primary) hypertension: Secondary | ICD-10-CM | POA: Diagnosis not present

## 2022-05-08 DIAGNOSIS — K219 Gastro-esophageal reflux disease without esophagitis: Secondary | ICD-10-CM | POA: Diagnosis not present

## 2022-05-15 DIAGNOSIS — N2581 Secondary hyperparathyroidism of renal origin: Secondary | ICD-10-CM | POA: Diagnosis not present

## 2022-05-15 DIAGNOSIS — N185 Chronic kidney disease, stage 5: Secondary | ICD-10-CM | POA: Diagnosis not present

## 2022-05-15 DIAGNOSIS — D631 Anemia in chronic kidney disease: Secondary | ICD-10-CM | POA: Diagnosis not present

## 2022-05-15 DIAGNOSIS — I129 Hypertensive chronic kidney disease with stage 1 through stage 4 chronic kidney disease, or unspecified chronic kidney disease: Secondary | ICD-10-CM | POA: Diagnosis not present

## 2022-05-16 ENCOUNTER — Ambulatory Visit (INDEPENDENT_AMBULATORY_CARE_PROVIDER_SITE_OTHER): Payer: Medicare Other

## 2022-05-16 ENCOUNTER — Telehealth (INDEPENDENT_AMBULATORY_CARE_PROVIDER_SITE_OTHER): Payer: Self-pay

## 2022-05-16 ENCOUNTER — Encounter (INDEPENDENT_AMBULATORY_CARE_PROVIDER_SITE_OTHER): Payer: Self-pay | Admitting: Nurse Practitioner

## 2022-05-16 ENCOUNTER — Ambulatory Visit (INDEPENDENT_AMBULATORY_CARE_PROVIDER_SITE_OTHER): Payer: Medicare Other | Admitting: Nurse Practitioner

## 2022-05-16 ENCOUNTER — Other Ambulatory Visit (INDEPENDENT_AMBULATORY_CARE_PROVIDER_SITE_OTHER): Payer: Self-pay | Admitting: Nurse Practitioner

## 2022-05-16 VITALS — BP 165/75 | HR 67 | Resp 18 | Ht 63.0 in

## 2022-05-16 DIAGNOSIS — Z992 Dependence on renal dialysis: Secondary | ICD-10-CM | POA: Diagnosis not present

## 2022-05-16 DIAGNOSIS — K219 Gastro-esophageal reflux disease without esophagitis: Secondary | ICD-10-CM | POA: Diagnosis not present

## 2022-05-16 DIAGNOSIS — E1169 Type 2 diabetes mellitus with other specified complication: Secondary | ICD-10-CM | POA: Diagnosis not present

## 2022-05-16 DIAGNOSIS — I10 Essential (primary) hypertension: Secondary | ICD-10-CM

## 2022-05-16 DIAGNOSIS — N186 End stage renal disease: Secondary | ICD-10-CM

## 2022-05-16 DIAGNOSIS — E118 Type 2 diabetes mellitus with unspecified complications: Secondary | ICD-10-CM | POA: Diagnosis not present

## 2022-05-16 DIAGNOSIS — C50912 Malignant neoplasm of unspecified site of left female breast: Secondary | ICD-10-CM | POA: Diagnosis not present

## 2022-05-16 DIAGNOSIS — J9601 Acute respiratory failure with hypoxia: Secondary | ICD-10-CM | POA: Diagnosis not present

## 2022-05-16 NOTE — Telephone Encounter (Signed)
Spoke with the patient and she is scheduled with Dr. Lucky Cowboy on 05/18/22 with a 7:45 am arrival time to the [facility] for a right upper extremity angio. Pre-procedure instructions were discussed and patient stated she understood.

## 2022-05-17 DIAGNOSIS — N186 End stage renal disease: Secondary | ICD-10-CM | POA: Diagnosis not present

## 2022-05-17 DIAGNOSIS — C50912 Malignant neoplasm of unspecified site of left female breast: Secondary | ICD-10-CM | POA: Diagnosis not present

## 2022-05-17 DIAGNOSIS — Z23 Encounter for immunization: Secondary | ICD-10-CM | POA: Diagnosis not present

## 2022-05-17 DIAGNOSIS — I10 Essential (primary) hypertension: Secondary | ICD-10-CM | POA: Diagnosis not present

## 2022-05-17 DIAGNOSIS — J9601 Acute respiratory failure with hypoxia: Secondary | ICD-10-CM | POA: Diagnosis not present

## 2022-05-17 DIAGNOSIS — K219 Gastro-esophageal reflux disease without esophagitis: Secondary | ICD-10-CM | POA: Diagnosis not present

## 2022-05-17 DIAGNOSIS — E118 Type 2 diabetes mellitus with unspecified complications: Secondary | ICD-10-CM | POA: Diagnosis not present

## 2022-05-18 ENCOUNTER — Ambulatory Visit
Admission: RE | Admit: 2022-05-18 | Discharge: 2022-05-18 | Disposition: A | Discharge status: Home / Self Care | Payer: Medicare Other | Attending: Vascular Surgery | Admitting: Vascular Surgery

## 2022-05-18 ENCOUNTER — Other Ambulatory Visit: Payer: Self-pay

## 2022-05-18 ENCOUNTER — Encounter
Admission: RE | Disposition: A | Discharge status: Home / Self Care | Payer: Self-pay | Source: Home / Self Care | Attending: Vascular Surgery

## 2022-05-18 ENCOUNTER — Encounter: Payer: Self-pay | Admitting: Vascular Surgery

## 2022-05-18 ENCOUNTER — Encounter (INDEPENDENT_AMBULATORY_CARE_PROVIDER_SITE_OTHER): Payer: Self-pay | Admitting: Nurse Practitioner

## 2022-05-18 DIAGNOSIS — I70208 Unspecified atherosclerosis of native arteries of extremities, other extremity: Secondary | ICD-10-CM | POA: Diagnosis not present

## 2022-05-18 DIAGNOSIS — Y832 Surgical operation with anastomosis, bypass or graft as the cause of abnormal reaction of the patient, or of later complication, without mention of misadventure at the time of the procedure: Secondary | ICD-10-CM | POA: Insufficient documentation

## 2022-05-18 DIAGNOSIS — N186 End stage renal disease: Secondary | ICD-10-CM | POA: Insufficient documentation

## 2022-05-18 DIAGNOSIS — E1122 Type 2 diabetes mellitus with diabetic chronic kidney disease: Secondary | ICD-10-CM | POA: Insufficient documentation

## 2022-05-18 DIAGNOSIS — Z992 Dependence on renal dialysis: Secondary | ICD-10-CM

## 2022-05-18 DIAGNOSIS — T82898A Other specified complication of vascular prosthetic devices, implants and grafts, initial encounter: Secondary | ICD-10-CM | POA: Diagnosis not present

## 2022-05-18 DIAGNOSIS — I771 Stricture of artery: Secondary | ICD-10-CM | POA: Diagnosis not present

## 2022-05-18 DIAGNOSIS — I120 Hypertensive chronic kidney disease with stage 5 chronic kidney disease or end stage renal disease: Secondary | ICD-10-CM | POA: Diagnosis not present

## 2022-05-18 HISTORY — PX: UPPER EXTREMITY ANGIOGRAPHY: CATH118270

## 2022-05-18 LAB — POTASSIUM (ARMC VASCULAR LAB ONLY): Potassium (ARMC vascular lab): 4.6 mmol/L (ref 3.5–5.1)

## 2022-05-18 SURGERY — UPPER EXTREMITY ANGIOGRAPHY
Anesthesia: Moderate Sedation | Site: Arm Upper | Laterality: Right

## 2022-05-18 MED ORDER — HYDRALAZINE HCL 20 MG/ML IJ SOLN
INTRAMUSCULAR | Status: DC | PRN
  Administered 2022-05-18: 10 mg via INTRAVENOUS

## 2022-05-18 MED ORDER — LABETALOL HCL 5 MG/ML IV SOLN
INTRAVENOUS | Status: DC | PRN
  Administered 2022-05-18 (×2): 10 mg via INTRAVENOUS

## 2022-05-18 MED ORDER — FENTANYL CITRATE (PF) 100 MCG/2ML IJ SOLN
INTRAMUSCULAR | Status: DC | PRN
  Administered 2022-05-18: 25 ug via INTRAVENOUS
  Administered 2022-05-18: 50 ug via INTRAVENOUS

## 2022-05-18 MED ORDER — DIPHENHYDRAMINE HCL 50 MG/ML IJ SOLN: 50.0000 mg | Freq: Once | INTRAMUSCULAR | Status: DC | PRN

## 2022-05-18 MED ORDER — HEPARIN SODIUM (PORCINE) 1000 UNIT/ML IJ SOLN
INTRAMUSCULAR | Status: AC
  Filled 2022-05-18: qty 10

## 2022-05-18 MED ORDER — LABETALOL HCL 5 MG/ML IV SOLN
INTRAVENOUS | Status: AC
  Filled 2022-05-18: qty 4

## 2022-05-18 MED ORDER — METHYLPREDNISOLONE SODIUM SUCC 125 MG IJ SOLR: 125.0000 mg | Freq: Once | INTRAMUSCULAR | Status: DC | PRN

## 2022-05-18 MED ORDER — LABETALOL HCL 5 MG/ML IV SOLN
INTRAVENOUS | Status: AC
  Administered 2022-05-18: 20 mg via INTRAVENOUS
  Filled 2022-05-18: qty 4

## 2022-05-18 MED ORDER — HYDRALAZINE HCL 20 MG/ML IJ SOLN
INTRAMUSCULAR | Status: AC
  Filled 2022-05-18: qty 1

## 2022-05-18 MED ORDER — HEPARIN SODIUM (PORCINE) 1000 UNIT/ML IJ SOLN
INTRAMUSCULAR | Status: DC | PRN
  Administered 2022-05-18: 4000 [IU] via INTRAVENOUS

## 2022-05-18 MED ORDER — CEFAZOLIN SODIUM-DEXTROSE 1-4 GM/50ML-% IV SOLN: 1.0000 g | INTRAVENOUS | Status: AC

## 2022-05-18 MED ORDER — FENTANYL CITRATE (PF) 100 MCG/2ML IJ SOLN
INTRAMUSCULAR | Status: AC
  Filled 2022-05-18: qty 2

## 2022-05-18 MED ORDER — FAMOTIDINE 20 MG PO TABS: 40.0000 mg | ORAL_TABLET | Freq: Once | ORAL | Status: DC | PRN

## 2022-05-18 MED ORDER — MIDAZOLAM HCL 2 MG/2ML IJ SOLN
INTRAMUSCULAR | Status: AC
  Filled 2022-05-18: qty 4

## 2022-05-18 MED ORDER — HYDROMORPHONE HCL 1 MG/ML IJ SOLN: 1.0000 mg | Freq: Once | INTRAMUSCULAR | Status: DC | PRN

## 2022-05-18 MED ORDER — SODIUM CHLORIDE 0.9 % IV SOLN: INTRAVENOUS | Status: DC

## 2022-05-18 MED ORDER — MIDAZOLAM HCL 2 MG/ML PO SYRP: 8.0000 mg | ORAL_SOLUTION | Freq: Once | ORAL | Status: DC | PRN

## 2022-05-18 MED ORDER — IODIXANOL 320 MG/ML IV SOLN
INTRAVENOUS | Status: DC | PRN
  Administered 2022-05-18: 65 mL

## 2022-05-18 MED ORDER — LABETALOL HCL 5 MG/ML IV SOLN: 20.0000 mg | Freq: Once | INTRAVENOUS | Status: AC

## 2022-05-18 MED ORDER — ONDANSETRON HCL 4 MG/2ML IJ SOLN: 4.0000 mg | Freq: Four times a day (QID) | INTRAMUSCULAR | Status: DC | PRN

## 2022-05-18 MED ORDER — MIDAZOLAM HCL 2 MG/2ML IJ SOLN
INTRAMUSCULAR | Status: DC | PRN
  Administered 2022-05-18 (×2): .5 mg via INTRAVENOUS
  Administered 2022-05-18: 1 mg via INTRAVENOUS

## 2022-05-18 MED ORDER — CEFAZOLIN SODIUM-DEXTROSE 1-4 GM/50ML-% IV SOLN
INTRAVENOUS | Status: AC
  Administered 2022-05-18: 1 g via INTRAVENOUS
  Filled 2022-05-18: qty 50

## 2022-05-18 SURGICAL SUPPLY — 23 items
BALLN ARMADA 3.0X100X150 (BALLOONS) ×1
BALLOON ARMADA 3.0X100X150 (BALLOONS) IMPLANT
CATH ANGIO 5F PIGTAIL 100CM (CATHETERS) IMPLANT
CATH BEACON 5 .035 100 JB2 TIP (CATHETERS) IMPLANT
CATH HEADHUNTER H1 5F 100CM (CATHETERS) IMPLANT
CATH NAVICROSS ANGLED 135CM (MICROCATHETER) IMPLANT
CATH SEEKER .018X150 (CATHETERS) IMPLANT
COVER PROBE ULTRASOUND 5X96 (MISCELLANEOUS) IMPLANT
DEVICE SAFEGUARD 24CM (GAUZE/BANDAGES/DRESSINGS) IMPLANT
DEVICE STARCLOSE SE CLOSURE (Vascular Products) IMPLANT
DEVICE TORQUE .025-.038 (MISCELLANEOUS) IMPLANT
DRAPE C-ARM 35X43 STRL (DRAPES) IMPLANT
GLIDEWIRE ADV .035X260CM (WIRE) IMPLANT
GLIDEWIRE ANGLED SS 035X260CM (WIRE) IMPLANT
GUIDEWIRE ANGLED .035X260CM (WIRE) IMPLANT
GUIDEWIRE PFTE-COATED .018X300 (WIRE) IMPLANT
KIT ENCORE 26 ADVANTAGE (KITS) IMPLANT
PACK ANGIOGRAPHY (CUSTOM PROCEDURE TRAY) IMPLANT
SHEATH BRITE TIP 5FRX11 (SHEATH) IMPLANT
SYR MEDRAD MARK 7 150ML (SYRINGE) IMPLANT
TUBING CONTRAST HIGH PRESS 72 (TUBING) IMPLANT
WIRE G V18X300CM (WIRE) IMPLANT
WIRE GUIDERIGHT .035X150 (WIRE) IMPLANT

## 2022-05-18 NOTE — Interval H&P Note (Signed)
History and Physical Interval Note:  05/18/2022 8:17 AM  Alexis Tucker  has presented today for surgery, with the diagnosis of RUE Angio    End Stage Renal.  The various methods of treatment have been discussed with the patient and family. After consideration of risks, benefits and other options for treatment, the patient has consented to  Procedure(s): Upper Extremity Angiography (Right) as a surgical intervention.  The patient's history has been reviewed, patient examined, no change in status, stable for surgery.  I have reviewed the patient's chart and labs.  Questions were answered to the patient's satisfaction.     Leotis Pain

## 2022-05-18 NOTE — Progress Notes (Signed)
Dr. Lucky Cowboy speaking with pt. Daughter Santiago Glad at bedside now. Pt. Still "sleepy" from procedure. Daughter verbalized understanding of conversation.

## 2022-05-18 NOTE — Op Note (Signed)
OPERATIVE REPORT     PREOPERATIVE DIAGNOSIS: 1. End-stage renal disease. 2. 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   POSTOPERATIVE DIAGNOSIS: Same as above with no obvious aneurysm seen at the arterial anastomosis but an occluded brachial artery beyond the arterial anastomosis as well as occlusion of the axillary vein proximal to the venous anastomosis   PROCEDURE PERFORMED: 1. Ultrasound guidance vascular access to right femoral artery. 2. Catheter placement to right brachial artery     from right femoral approach. 3. Thoracic aortogram and selective right upper extremity angiogram 4.  Percutaneous transluminal angioplasty of the right brachial artery with 3 mm diameter angioplasty balloon 5. StarClose closure device right femoral artery.   SURGEON:  Algernon Huxley, MD   ANESTHESIA:  Local with moderate conscious sedation for 69 minutes using 2 mg of Versed and 75 mcg of Fentanyl   BLOOD LOSS:  Minimal.   FLUOROSCOPY TIME: 24 minutes   INDICATION FOR PROCEDURE:  This is a 81 y.o.female who presented to our office with steal syndrome.  The patient also has marked swelling near her arterial anastomosis concerning for an aneurysm at the site, but their hand is numb and painful.  To further evaluate this to determine what options would be possible to treat the steal syndrome and possible aneurysm of the arterial anastomosis, angiogram of the left upper extremity is indicated.  Risks and benefits are discussed.  Informed consent was obtained.   DESCRIPTION OF PROCEDURE:  The patient was brought to the vascular suite.  Moderate conscious sedation was administered during a face to face encounter with the patient throughout the procedure with my supervision of the RN administering medicines and monitoring the patient's vital signs, pulse oximetry, telemetry and mental status throughout from the start of the procedure until the patient  was taken to the recovery room.  Groins were shaved and prepped and sterile surgical field was created.  The right femoral head was localized with fluoroscopy and the right femoral artery was then visualized with ultrasound and found to be widely patent.  It was then accessed under direct ultrasound guidance without difficulty with a Seldinger needle and a permanent image was recorded.  A J-wire and 5-French sheath were then placed.  Pigtail catheter was placed into the ascending aorta and a thoracic aortogram was then performed in the LAO projection. This demonstrated normal origins to the great vessels without significant proximal stenoses and a normal configuration of the great vessels.  The patient was given 4000 units of intravenous heparin and a JB2 catheter was used to selectively cannulate the innominate artery and then advanced into the right subclavian artery with marked difficulty due to the extensive tortuosity in the vessels.  The JB2 catheter would not track much further than the axillary artery and I had to exchange for a Nava cross catheter.  This was then sequentially advanced to the brachial artery just proximal to the AV graft.  Steal was demonstrated with all of the flow from the artery going into the fistula and not downstream to the hand on images with the catheter proximal to the access.  The brachial artery was actually occluded distal to the AV graft and there was only reconstitution that was exceedingly faint in the brachial artery through a small collateral but I did not see filling distally.  Also of note, there did not appear to be an aneurysm of the arterial anastomosis to the AV graft that was  visible on angiography.  The graft itself was patent but the axillary vein appeared to be occluded just proximal to the AV graft and extensive venous collaterals were filling in keeping the graft open.  Due to the marked tortuosity in her arm and the occlusion of the brachial artery, it  was extremely difficult to get anything distally.  I was able to get a 0.018 advantage wire across the arterial anastomosis and across the brachial artery occlusion and get a wire down into the ulnar artery but could never get any diagnostic catheter including a 0.018 seeker catheter to track across the AV graft anastomosis and brachial artery occlusion.  I took a 3 mm diameter by 10 cm length angioplasty balloon over the 0.018 advantage wire and could get to the brachial artery at the level of the AV graft anastomosis but could never get it distally into the brachial artery beyond the anastomosis.  I inflated to 14 atm brachial artery to try to open the channel but there remained occlusion with all the flow going into the AV graft.  After over 20 minutes of fluoroscopy and being unable to get any catheter across the occlusion, it was clear this was not going to be able to be treated from this approach.  I elected to terminate the procedure. The diagnostic catheter was removed.  Oblique arteriogram was performed of the right femoral artery and StarClose closure device deployed in the usual fashion with excellent hemostatic result. The patient tolerated the procedure well and was taken to the recovery room in stable condition.    Alexis Tucker 05/18/2022 10:19 AM

## 2022-05-18 NOTE — H&P (View-Only) (Signed)
Subjective:    Patient ID: Alexis Tucker, female    DOB: 1940/10/17, 81 y.o.   MRN: [id number] No chief complaint on file.   Alexis Tucker is a 81 year old female who returns today for evaluation of her right upper extremity graft.  Following placement the patient underwent postoperative ultrasound which showed a hematoma near the area of anastomosis.  Initially it did not cause the patient significant discomfort however over the last several weeks the area has begun to enlarge and become painful for the patient.  The patient has not had her dialysis access utilized as she decided that she did not wish to move forward with any dialysis treatment.  She was initially moved to hospice care due to the decision to not undergo any further treatment.  However, the patient has recently had growth of the area with worsening pain.  Also associated with this the patient has begun to have numbness and tingling in her right hand and a decrease in motor function as well.  The fingertips are also discolored, with exhibiting signs and symptoms of possible steal syndrome.  Today noninvasive studies show a flow volume of 1564 and a continued hematoma in the area although slightly larger.    Review of Systems  Skin:  Positive for color change.  Neurological:  Positive for weakness.  All other systems reviewed and are negative.      Objective:   Physical Exam Vitals reviewed.  HENT:     Head: Normocephalic.  Cardiovascular:     Rate and Rhythm: Normal rate.     Pulses:          Radial pulses are 0 on the right side.     Arteriovenous access: Right arteriovenous access is present.    Comments: Thrill and bruit present however large mass noted at site which is larger than noted at previous visit. Pulmonary:     Effort: Pulmonary effort is normal.  Skin:    General: Skin is warm and dry.  Neurological:     Mental Status: She is alert and oriented to person, place, and time.     Motor: Weakness present.   Psychiatric:        Mood and Affect: Mood normal.        Behavior: Behavior normal.        Thought Content: Thought content normal.        Judgment: Judgment normal.     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   a.) moderately differentiated IMC (G2, T1N0Mx); s/p lumpectomy + adjuvant XRT   Breast cancer, 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   a.) Avastin injections as  [facility]   DJD (degenerative joint disease)    ESRD (end stage renal disease) (HCC)    GERD (gastroesophageal reflux disease)    Glaucoma 03/2014   History of colon cancer 2006   a.) s/p colectomy   History of hepatitis A 1990s   from food, resolved    HTN (hypertension)    Hyperplastic colon polyp    Long term current use of anticoagulant    a.) apixaban   Long term current use of antithrombotics/antiplatelets    a.) pentoxifylline   Macular degeneration    wet R with vision loss, dry L; to see retina specialist   Neuropathy    OA (osteoarthritis)    Osteonecrosis (HCC)    hips?   PONV (postoperative nausea and vomiting)    Post-surgical hypothyroidism    Thrombocytopenia (HCC)    Tubular adenoma    Wears dentures    partial upper    Social History   Socioeconomic History   Marital status: Divorced    Spouse name: Not on file   Number of children: Not on file   Years of education: Not on file   Highest education level: Not on file  Occupational History   Not on file  Tobacco Use   Smoking status: Former   Smokeless tobacco: Never   Tobacco comments:    quit over 40 yrs ago  Vaping Use   Vaping Use: Never used  Substance and Sexual Activity   Alcohol use: Never    Comment: rare   Drug use: Never   Sexual activity: Not Currently  Other Topics Concern   Not on file  Social History Narrative   Lives alone, 1 dog.   Granddaughter in [place] (attorney).   Divorced   Occu: retired, worked in IT   Edu: college   Social Determinants of Health   Financial Resource Strain: Low Risk  (12/28/2021)   Overall Financial Resource Strain (CARDIA)    Difficulty of Paying Living Expenses: Not hard at all  Food Insecurity: No Food Insecurity (04/24/2022)   Hunger Vital Sign    Worried About Running Out of Food in the Last Year: Never true    Ran Out of Food in the Last Year: Never true  Transportation Needs: No Transportation Needs (04/24/2022)   PRAPARE - Transportation    Lack of Transportation (Medical): No    Lack of Transportation (Non-Medical): No  Physical Activity: Inactive (12/28/2021)   Exercise Vital Sign    Days of Exercise per Week: 0 days    Minutes of Exercise per Session: 0 min  Stress: No Stress Concern  Present (12/28/2021)   [employer] - Occupational Stress Questionnaire    Feeling of Stress : Not at all  Social Connections: Not on file  Intimate Partner Violence: Not At Risk (04/24/2022)   Humiliation, Afraid, Rape, and Kick questionnaire    Fear of Current or Ex-Partner: No    Emotionally Abused: No      Physically Abused: No    Sexually Abused: No    Past Surgical History:  Procedure Laterality Date   ABDOMINAL HYSTERECTOMY  1976   partial, after tubal pregnancy, h/o fibroids with heavy bleeding   AV FISTULA PLACEMENT Right 02/01/2022   Procedure: INSERTION OF ARTERIOVENOUS (AV) GORE-TEX GRAFT ARM (LEFT UPPER EXTREMITY);  Surgeon: Algernon Huxley, MD;  Location: [facility] ORS;  Service: Vascular;  Laterality: Right;   BREAST BIOPSY Left 2008   positive   BREAST BIOPSY Left 11/22/2017   2oclock 2cmfn wing shaped clip, INVASIVE MAMMARY CARCINOMA   BREAST BIOPSY Left 11/22/2017   2oclock 4cmfn coil shaped clip, INVASIVE MAMMARY CARCINOMA   BREAST BIOPSY Left 11/22/2017   lymph node - butterfly hydroMARK   BREAST EXCISIONAL BIOPSY Left 01/07/2018   lumpectomy by Dr. Bary Castilla   BREAST LUMPECTOMY Left 2008   F/U radiation   BREAST LUMPECTOMY Left 2019   2 areas of [facility] at 2:00 position f/u with mammosite   BREAST LUMPECTOMY WITH SENTINEL LYMPH NODE BIOPSY Left 01/07/2018   Procedure: BREAST LUMPECTOMY WITH SENTINEL LYMPH NODE BX;  Surgeon: Robert Bellow, MD;  Location: [facility] ORS;  Service: General;  Laterality: Left;   BREAST MAMMOSITE  2008   carotid ultrasound  12/2012   1-39% bilateral stenosis, severe in ECA   CATARACT EXTRACTION W/PHACO Left 08/14/2016   Procedure: CATARACT EXTRACTION PHACO AND INTRAOCULAR LENS PLACEMENT ([place])  Left Diabetic;  Surgeon: Ronnell Freshwater, MD;  Location: [facility];  Service: Ophthalmology;  Laterality: Left;  Diabetic - oral meds   CHOLECYSTECTOMY  2005   COLECTOMY  2005   colon cancer   COLON SURGERY   2006   bowel obstruction   COLONOSCOPY  2008   ext hem, ileo-colonic anastomosis in cecum, tubular adenoma x1, rec rpt 1 yr for multiple polyps (in [place])   COLONOSCOPY WITH PROPOFOL N/A 10/19/2021   2.5cm TVA with low grade dysplasia, diverticulosis, no rpt recommended Vicente Males, Bailey Mech, MD)   ESOPHAGOGASTRODUODENOSCOPY N/A 05/06/2016   Procedure: ESOPHAGOGASTRODUODENOSCOPY (EGD);  Surgeon: Manya Silvas, MD;  Location: [facility] ENDOSCOPY;  Service: Endoscopy;  Laterality: N/A;   EYE SURGERY     JOINT REPLACEMENT     THYROIDECTOMY, PARTIAL     unclear why   TOTAL KNEE ARTHROPLASTY Right 1999   US ECHOCARDIOGRAPHY  11/2007   nl LV fxn EF 32%, diastolic dysfunction, LVH, tr TR and MR, slcerotic aortic valve    Family History  Problem Relation Age of Onset   Diabetes Mother    Hypertension Mother    Arthritis Mother    Alcohol abuse Father    Arthritis Father    Stroke Daughter    CAD Neg Hx    Cancer Neg Hx    Breast cancer Neg Hx     Allergies  Allergen Reactions   Tramadol Other (See Comments)    nightmares   Codeine Anxiety   Sulfa Antibiotics Itching and Rash       Latest Ref Rng & Units 04/24/2022    9:00 AM 04/23/2022    6:12 AM 04/22/2022   12:01 PM  CBC  WBC 4.0 - 10.5 K/uL 6.0  9.2  9.0   Hemoglobin 12.0 - 15.0 g/dL 8.0  7.5  8.7   Hematocrit 36.0 - 46.0 % 25.1  24.4  29.2   Platelets 150 - 400 K/uL 100  94  112       CMP     Component Value Date/Time   NA  142 04/25/2022 0445   NA 140 10/16/2012 1227   K 3.5 04/25/2022 0445   K 4.3 02/26/2014 0000   CL 108 04/25/2022 0445   CL 106 10/16/2012 1227   CO2 25 04/25/2022 0445   CO2 24 10/16/2012 1227   GLUCOSE 100 (H) 04/25/2022 0445   GLUCOSE 262 (H) 10/16/2012 1227   BUN 45 (H) 04/25/2022 0445   BUN 22 (H) 10/16/2012 1227   CREATININE 5.60 (H) 04/25/2022 0445   CREATININE 4.05 02/26/2014 0000   CALCIUM 8.8 (L) 04/25/2022 0445   CALCIUM 9.0 10/16/2012 1227   PROT 7.5 11/15/2021 1722   PROT 7.9  10/16/2012 1227   ALBUMIN 3.8 11/25/2021 1217   ALBUMIN 3.9 10/16/2012 1227   AST 15 11/15/2021 1722   AST 19 10/16/2012 1227   AST 14 01/11/2012 0000   ALT 10 11/15/2021 1722   ALT 21 10/16/2012 1227   ALKPHOS 70 11/15/2021 1722   ALKPHOS 106 10/16/2012 1227   ALKPHOS 91 01/11/2012 0000   BILITOT 0.5 11/15/2021 1722   BILITOT 0.4 10/16/2012 1227   BILITOT 0.4 01/11/2012 0000   GFRNONAA 7 (L) 04/25/2022 0445   GFRAA 10 (L) 01/07/2018 0859     No results found.     Assessment & Plan:   1. ESRD (end stage renal disease) ([place]) Based upon the presentation although it is reported as a hematoma, I suspect this is an aneurysm.  That is due to the increase in size despite not being utilized.  The area is also very tender and the patient is beginning to display some symptoms of steal syndrome in her right upper extremity.  I suspect the symptoms are all result of the worsening aneurysm.  In order to treat this we will need to perform a right upper extremity angiogram with a likely covered stent placement to the area.  Based on the nature of the stent as well as the possible location, it is possible that with treatment the covered stent may overlie her graft craniotomy and unusable.  Following this fashion, the patient initially did not want to proceed with dialysis at all however she has reconsidered and is now open to the idea of dialysis.  The patient does indeed wish to proceed with dialysis she will likely need a PermCath placement if her symptoms worsen and she requires imminent need of dialysis and the graft is rendered unusable.  We discussed the procedure as well as the risk, benefits and alternatives and the patient is agreeable to proceed.  2. Primary hypertension Continue antihypertensive medications as already ordered, these medications have been reviewed and there are no changes at this time.  3. Type 2 diabetes mellitus with other specified complication, without long-term current  use of insulin (HCC) Continue hypoglycemic medications as already ordered, these medications have been reviewed and there are no changes at this time.  Hgb A1C to be monitored as already arranged by primary service   Current Outpatient Medications on File Prior to Visit  Medication Sig Dispense Refill   allopurinol (ZYLOPRIM) 100 MG tablet Take 1 tablet (100 mg total) by mouth daily. 90 tablet 3   anastrozole (ARIMIDEX) 1 MG tablet Take 1 tablet (1 mg total) by mouth daily. 90 tablet 1   apixaban (ELIQUIS) 5 MG TABS tablet Take 1 tablet (5 mg total) by mouth 2 (two) times daily. 60 tablet 6   Blood Glucose Monitoring Suppl (ACURA BLOOD GLUCOSE METER) W/DEVICE KIT Use as directed to check sugars daily 250.42 1  kit 0   CALCIUM-MAGNESIUM PO Take 1 tablet by mouth daily.     carvedilol (COREG) 25 MG tablet Take 0.5 tablets (12.5 mg total) by mouth 2 (two) times daily with a meal. 30 tablet 2   Cholecalciferol (VITAMIN D-3) 5000 UNITS TABS Take 1 tablet by mouth daily.     isosorbide dinitrate (ISORDIL) 20 MG tablet Take 1 tablet (20 mg total) by mouth daily. 90 tablet 3   levothyroxine (SYNTHROID) 88 MCG tablet TAKE 1 TABLET BY MOUTH EVERY DAY 90 tablet 3   ondansetron (ZOFRAN) 4 MG tablet Take 1 tablet (4 mg total) by mouth every 6 (six) hours as needed for nausea. 20 tablet 0   pantoprazole (PROTONIX) 40 MG tablet Take 1 tablet (40 mg total) by mouth daily as needed. 90 tablet 1   pentoxifylline (TRENTAL) 400 MG CR tablet Take 1 tablet (400 mg total) by mouth daily. 90 tablet 3   senna-docusate (SENOKOT-S) 8.6-50 MG tablet Take 1 tablet by mouth at bedtime as needed for mild constipation. 30 tablet 0   torsemide (DEMADEX) 20 MG tablet Take 3 tablets (60 mg total) by mouth daily. 270 tablet 0   No current facility-administered medications on file prior to visit.    There are no Patient Instructions on file for this visit. No follow-ups on file.   Kris Hartmann, NP

## 2022-05-18 NOTE — Progress Notes (Signed)
Subjective:    Patient ID: Alexis Tucker, female    DOB: 1941/02/12, 81 y.o.   MRN: [id number] No chief complaint on file.   Alexis Tucker is a 81 year old female who returns today for evaluation of her right upper extremity graft.  Following placement the patient underwent postoperative ultrasound which showed a hematoma near the area of anastomosis.  Initially it did not cause the patient significant discomfort however over the last several weeks the area has begun to enlarge and become painful for the patient.  The patient has not had her dialysis access utilized as she decided that she did not wish to move forward with any dialysis treatment.  She was initially moved to hospice care due to the decision to not undergo any further treatment.  However, the patient has recently had growth of the area with worsening pain.  Also associated with this the patient has begun to have numbness and tingling in her right hand and a decrease in motor function as well.  The fingertips are also discolored, with exhibiting signs and symptoms of possible steal syndrome.  Today noninvasive studies show a flow volume of 1564 and a continued hematoma in the area although slightly larger.    Review of Systems  Skin:  Positive for color change.  Neurological:  Positive for weakness.  All other systems reviewed and are negative.      Objective:   Physical Exam Vitals reviewed.  HENT:     Head: Normocephalic.  Cardiovascular:     Rate and Rhythm: Normal rate.     Pulses:          Radial pulses are 0 on the right side.     Arteriovenous access: Right arteriovenous access is present.    Comments: Thrill and bruit present however large mass noted at site which is larger than noted at previous visit. Pulmonary:     Effort: Pulmonary effort is normal.  Skin:    General: Skin is warm and dry.  Neurological:     Mental Status: She is alert and oriented to person, place, and time.     Motor: Weakness present.   Psychiatric:        Mood and Affect: Mood normal.        Behavior: Behavior normal.        Thought Content: Thought content normal.        Judgment: Judgment normal.     BP (!) 165/75 (BP Location: Left Arm)   Pulse 67   Resp 18   Ht 5' 3" (1.6 m)   BMI 32.77 kg/m   Past Medical History:  Diagnosis Date   Anemia    Aortic stenosis 11/16/2021   a.) TTE 11/16/2021: EF 55-60%, mild AS (MPF 3.3 mmHg); AVA (VTI) = 2.52 cm   Atrial fibrillation (HCC)    a.) CHA2DS2-VASc = 9 (age x 2, sex, CHF, HTN, CVA x2, vascular disease history, T2DM);  b.) rate/rhythm maintained on oral carvedilol; chronically anticoagulated using apixban   Breast cancer, left ([place]) 09/19/2006   a.) moderately differentiated IMC (G2, T1N0Mx); s/p lumpectomy + adjuvant XRT   Breast cancer, left ([place]) 11/27/2017   a.) [place] (mpT2 pN0, G2, ER/PR positive, HER-2/neu negative); b.) mammosite   Carotid stenosis, asymptomatic 01/10/2013   a.) carotid dopplers 01/10/2013, 02/27/2014, 09/10/2017, 07/26/2020: >50% BECA, 1-39% BICA   Cataracts, bilateral    Chronic diastolic CHF (congestive heart failure) ([place])    a.) TTE 11/26/2007: EF 60%, LVH, triv TR/MR, AoV sclerosis  with no stenosis, G1DD; b.) TTE 05/04/2016: EF 60-65%, LAE, G1DD; c.) TTE 11/16/2021: EF 55-60%, LVH, RVE, mild-mod LAE, mod MAC, mod MR, mild TR/AR, G2DD.   Coronary artery calcification seen on CT scan    CVA (cerebral vascular accident) ([place])    a.) remotes infarct(s) noted on CT imaging from 08/17/2017 --> RIGHT parietotemporal infarct; lacunar infarct LEFT pons   Diabetes type 2, controlled ([place]) 2000s   Diabetic retinopathy with macular edema ([place]) 03/2014   a.) Avastin injections as [place]   DJD (degenerative joint disease)    ESRD (end stage renal disease) ([place])    GERD (gastroesophageal reflux disease)    Glaucoma 03/2014   History of colon cancer 2006   a.) s/p colectomy   History of hepatitis A 1990s   from food, resolved    HTN (hypertension)    Hyperplastic colon polyp    Long term current use of anticoagulant    a.) apixaban   Long term current use of antithrombotics/antiplatelets    a.) pentoxifylline   Macular degeneration    wet R with vision loss, dry L; to see retina specialist   Neuropathy    OA (osteoarthritis)    Osteonecrosis (HCC)    hips?   PONV (postoperative nausea and vomiting)    Post-surgical hypothyroidism    Thrombocytopenia (HCC)    Tubular adenoma    Wears dentures    partial upper    Social History   Socioeconomic History   Marital status: Divorced    Spouse name: Not on file   Number of children: Not on file   Years of education: Not on file   Highest education level: Not on file  Occupational History   Not on file  Tobacco Use   Smoking status: Former   Smokeless tobacco: Never   Tobacco comments:    quit over 40 yrs ago  Vaping Use   Vaping Use: Never used  Substance and Sexual Activity   Alcohol use: Never    Comment: rare   Drug use: Never   Sexual activity: Not Currently  Other Topics Concern   Not on file  Social History Narrative   Lives alone, 1 dog.   Granddaughter in [place] (attorney).   Divorced   Occu: retired, worked in Engineer, technical sales   Edu: college   Social Determinants of Radio broadcast assistant Strain: [place]  (12/28/2021)   Overall Financial Resource Strain (CARDIA)    Difficulty of Paying Living Expenses: Not hard at all  Food Insecurity: No Food Insecurity (04/24/2022)   Hunger Vital Sign    Worried About Running Out of Food in the Last Year: Never true    [place] in the Last Year: Never true  Transportation Needs: No Transportation Needs (04/24/2022)   PRAPARE - Hydrologist (Medical): No    Lack of Transportation (Non-Medical): No  Physical Activity: Inactive (12/28/2021)   Exercise Vital Sign    Days of Exercise per Week: 0 days    Minutes of Exercise per Session: 0 min  Stress: No Stress Concern  Present (12/28/2021)   [place]    Feeling of Stress : Not at all  Social Connections: Not on file  Intimate Partner Violence: Not At Risk (04/24/2022)   Humiliation, Afraid, Rape, and Kick questionnaire    Fear of Current or Ex-Partner: No    Emotionally Abused: No  Physically Abused: No    Sexually Abused: No    Past Surgical History:  Procedure Laterality Date   ABDOMINAL HYSTERECTOMY  1976   partial, after tubal pregnancy, h/o fibroids with heavy bleeding   AV FISTULA PLACEMENT Right 02/01/2022   Procedure: INSERTION OF ARTERIOVENOUS (AV) GORE-TEX GRAFT ARM (LEFT UPPER EXTREMITY);  Surgeon: Algernon Huxley, MD;  Location: [facility] ORS;  Service: Vascular;  Laterality: Right;   BREAST BIOPSY Left 2008   positive   BREAST BIOPSY Left 11/22/2017   2oclock 2cmfn wing shaped clip, INVASIVE MAMMARY CARCINOMA   BREAST BIOPSY Left 11/22/2017   2oclock 4cmfn coil shaped clip, INVASIVE MAMMARY CARCINOMA   BREAST BIOPSY Left 11/22/2017   lymph node - butterfly hydroMARK   BREAST EXCISIONAL BIOPSY Left 01/07/2018   lumpectomy by Dr. Bary Castilla   BREAST LUMPECTOMY Left 2008   F/U radiation   BREAST LUMPECTOMY Left 2019   2 areas of [facility] at 2:00 position f/u with mammosite   BREAST LUMPECTOMY WITH SENTINEL LYMPH NODE BIOPSY Left 01/07/2018   Procedure: BREAST LUMPECTOMY WITH SENTINEL LYMPH NODE BX;  Surgeon: Robert Bellow, MD;  Location: [facility] ORS;  Service: General;  Laterality: Left;   BREAST MAMMOSITE  2008   carotid ultrasound  12/2012   1-39% bilateral stenosis, severe in ECA   CATARACT EXTRACTION W/PHACO Left 08/14/2016   Procedure: CATARACT EXTRACTION PHACO AND INTRAOCULAR LENS PLACEMENT ([place])  Left Diabetic;  Surgeon: Ronnell Freshwater, MD;  Location: [facility];  Service: Ophthalmology;  Laterality: Left;  Diabetic - oral meds   CHOLECYSTECTOMY  2005   COLECTOMY  2005   colon cancer   COLON SURGERY   2006   bowel obstruction   COLONOSCOPY  2008   ext hem, ileo-colonic anastomosis in cecum, tubular adenoma x1, rec rpt 1 yr for multiple polyps (in [place])   COLONOSCOPY WITH PROPOFOL N/A 10/19/2021   2.5cm TVA with low grade dysplasia, diverticulosis, no rpt recommended Vicente Males, Bailey Mech, MD)   ESOPHAGOGASTRODUODENOSCOPY N/A 05/06/2016   Procedure: ESOPHAGOGASTRODUODENOSCOPY (EGD);  Surgeon: Manya Silvas, MD;  Location: [facility];  Service: Endoscopy;  Laterality: N/A;   EYE SURGERY     JOINT REPLACEMENT     THYROIDECTOMY, PARTIAL     unclear why   TOTAL KNEE ARTHROPLASTY Right 1999   US ECHOCARDIOGRAPHY  11/2007   nl LV fxn EF 63%, diastolic dysfunction, LVH, tr TR and MR, slcerotic aortic valve    Family History  Problem Relation Age of Onset   Diabetes Mother    Hypertension Mother    Arthritis Mother    Alcohol abuse Father    Arthritis Father    Stroke Daughter    CAD Neg Hx    Cancer Neg Hx    Breast cancer Neg Hx     Allergies  Allergen Reactions   Tramadol Other (See Comments)    nightmares   Codeine Anxiety   Sulfa Antibiotics Itching and Rash       Latest Ref Rng & Units 04/24/2022    9:00 AM 04/23/2022    6:12 AM 04/22/2022   12:01 PM  CBC  WBC 4.0 - 10.5 K/uL 6.0  9.2  9.0   Hemoglobin 12.0 - 15.0 g/dL 8.0  7.5  8.7   Hematocrit 36.0 - 46.0 % 25.1  24.4  29.2   Platelets 150 - 400 K/uL 100  94  112       CMP     Component Value Date/Time   NA  142 04/25/2022 0445   NA 140 10/16/2012 1227   K 3.5 04/25/2022 0445   K 4.3 02/26/2014 0000   CL 108 04/25/2022 0445   CL 106 10/16/2012 1227   CO2 25 04/25/2022 0445   CO2 24 10/16/2012 1227   GLUCOSE 100 (H) 04/25/2022 0445   GLUCOSE 262 (H) 10/16/2012 1227   BUN 45 (H) 04/25/2022 0445   BUN 22 (H) 10/16/2012 1227   CREATININE 5.60 (H) 04/25/2022 0445   CREATININE 4.05 02/26/2014 0000   CALCIUM 8.8 (L) 04/25/2022 0445   CALCIUM 9.0 10/16/2012 1227   PROT 7.5 11/15/2021 1722   PROT 7.9  10/16/2012 1227   ALBUMIN 3.8 11/25/2021 1217   ALBUMIN 3.9 10/16/2012 1227   AST 15 11/15/2021 1722   AST 19 10/16/2012 1227   AST 14 01/11/2012 0000   ALT 10 11/15/2021 1722   ALT 21 10/16/2012 1227   ALKPHOS 70 11/15/2021 1722   ALKPHOS 106 10/16/2012 1227   ALKPHOS 91 01/11/2012 0000   BILITOT 0.5 11/15/2021 1722   BILITOT 0.4 10/16/2012 1227   BILITOT 0.4 01/11/2012 0000   GFRNONAA 7 (L) 04/25/2022 0445   GFRAA 10 (L) 01/07/2018 0859     No results found.     Assessment & Plan:   1. 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  That is due to the increase in size despite not being utilized.  The area is also very tender and the patient is beginning to display some symptoms of steal syndrome in her right upper extremity.  I suspect the symptoms are all result of the worsening aneurysm.  In order to treat this we will need to perform a right upper extremity angiogram with a likely covered stent placement to the area.  Based on the nature of the stent as well as the possible location, it is possible that with treatment the covered stent may overlie her graft craniotomy and unusable.  Following this fashion, the patient initially did not want to proceed with dialysis at all however she has reconsidered and is now open to the idea of dialysis.  The patient does indeed wish to proceed with dialysis she will likely need a PermCath placement if her symptoms worsen and she requires imminent need of dialysis and the graft is rendered unusable.  We discussed the procedure as well as the risk, benefits and alternatives and the patient is agreeable to proceed.  2. Primary hypertension Continue antihypertensive medications as already ordered, these medications have been reviewed and there are no changes at this time.  3. Type 2 diabetes mellitus with other specified complication, without long-term current  use of insulin (HCC) Continue hypoglycemic medications as already ordered, these medications have been reviewed and there are no changes at this time.  Hgb A1C to be monitored as already arranged by primary service   Current Outpatient Medications on File Prior to Visit  Medication Sig Dispense Refill   allopurinol (ZYLOPRIM) 100 MG tablet Take 1 tablet (100 mg total) by mouth daily. 90 tablet 3   anastrozole (ARIMIDEX) 1 MG tablet Take 1 tablet (1 mg total) by mouth daily. 90 tablet 1   apixaban (ELIQUIS) 5 MG TABS tablet Take 1 tablet (5 mg total) by mouth 2 (two) times daily. 60 tablet 6   Blood Glucose Monitoring Suppl (ACURA BLOOD GLUCOSE METER) W/DEVICE KIT Use as directed to check sugars daily 250.42 1  kit 0   CALCIUM-MAGNESIUM PO Take 1 tablet by mouth daily.     carvedilol (COREG) 25 MG tablet Take 0.5 tablets (12.5 mg total) by mouth 2 (two) times daily with a meal. 30 tablet 2   Cholecalciferol (VITAMIN D-3) 5000 UNITS TABS Take 1 tablet by mouth daily.     isosorbide dinitrate (ISORDIL) 20 MG tablet Take 1 tablet (20 mg total) by mouth daily. 90 tablet 3   levothyroxine (SYNTHROID) 88 MCG tablet TAKE 1 TABLET BY MOUTH EVERY DAY 90 tablet 3   ondansetron (ZOFRAN) 4 MG tablet Take 1 tablet (4 mg total) by mouth every 6 (six) hours as needed for nausea. 20 tablet 0   pantoprazole (PROTONIX) 40 MG tablet Take 1 tablet (40 mg total) by mouth daily as needed. 90 tablet 1   pentoxifylline (TRENTAL) 400 MG CR tablet Take 1 tablet (400 mg total) by mouth daily. 90 tablet 3   senna-docusate (SENOKOT-S) 8.6-50 MG tablet Take 1 tablet by mouth at bedtime as needed for mild constipation. 30 tablet 0   torsemide (DEMADEX) 20 MG tablet Take 3 tablets (60 mg total) by mouth daily. 270 tablet 0   No current facility-administered medications on file prior to visit.    There are no Patient Instructions on file for this visit. No follow-ups on file.   Kris Hartmann, NP

## 2022-05-19 ENCOUNTER — Encounter: Payer: Self-pay | Admitting: Vascular Surgery

## 2022-05-22 DIAGNOSIS — N186 End stage renal disease: Secondary | ICD-10-CM | POA: Diagnosis not present

## 2022-05-22 DIAGNOSIS — J9601 Acute respiratory failure with hypoxia: Secondary | ICD-10-CM | POA: Diagnosis not present

## 2022-05-22 DIAGNOSIS — I10 Essential (primary) hypertension: Secondary | ICD-10-CM | POA: Diagnosis not present

## 2022-05-22 DIAGNOSIS — E118 Type 2 diabetes mellitus with unspecified complications: Secondary | ICD-10-CM | POA: Diagnosis not present

## 2022-05-22 DIAGNOSIS — C50912 Malignant neoplasm of unspecified site of left female breast: Secondary | ICD-10-CM | POA: Diagnosis not present

## 2022-05-22 DIAGNOSIS — K219 Gastro-esophageal reflux disease without esophagitis: Secondary | ICD-10-CM | POA: Diagnosis not present

## 2022-05-23 ENCOUNTER — Other Ambulatory Visit: Payer: Medicare Other

## 2022-05-23 ENCOUNTER — Ambulatory Visit: Payer: Medicare Other

## 2022-05-23 ENCOUNTER — Ambulatory Visit: Payer: Medicare Other | Admitting: Internal Medicine

## 2022-05-29 DIAGNOSIS — C50912 Malignant neoplasm of unspecified site of left female breast: Secondary | ICD-10-CM | POA: Diagnosis not present

## 2022-05-29 DIAGNOSIS — N186 End stage renal disease: Secondary | ICD-10-CM | POA: Diagnosis not present

## 2022-05-29 DIAGNOSIS — K219 Gastro-esophageal reflux disease without esophagitis: Secondary | ICD-10-CM | POA: Diagnosis not present

## 2022-05-29 DIAGNOSIS — E118 Type 2 diabetes mellitus with unspecified complications: Secondary | ICD-10-CM | POA: Diagnosis not present

## 2022-05-29 DIAGNOSIS — J9601 Acute respiratory failure with hypoxia: Secondary | ICD-10-CM | POA: Diagnosis not present

## 2022-05-29 DIAGNOSIS — I10 Essential (primary) hypertension: Secondary | ICD-10-CM | POA: Diagnosis not present

## 2022-05-30 ENCOUNTER — Encounter: Payer: Self-pay | Admitting: Internal Medicine

## 2022-06-02 ENCOUNTER — Encounter (INDEPENDENT_AMBULATORY_CARE_PROVIDER_SITE_OTHER): Payer: Self-pay | Admitting: Nurse Practitioner

## 2022-06-02 ENCOUNTER — Ambulatory Visit (INDEPENDENT_AMBULATORY_CARE_PROVIDER_SITE_OTHER): Payer: Medicare Other | Admitting: Nurse Practitioner

## 2022-06-02 VITALS — BP 139/81 | HR 67 | Resp 16

## 2022-06-02 DIAGNOSIS — E1169 Type 2 diabetes mellitus with other specified complication: Secondary | ICD-10-CM | POA: Diagnosis not present

## 2022-06-02 DIAGNOSIS — N185 Chronic kidney disease, stage 5: Secondary | ICD-10-CM

## 2022-06-02 DIAGNOSIS — C50912 Malignant neoplasm of unspecified site of left female breast: Secondary | ICD-10-CM | POA: Diagnosis not present

## 2022-06-02 DIAGNOSIS — I10 Essential (primary) hypertension: Secondary | ICD-10-CM | POA: Diagnosis not present

## 2022-06-02 DIAGNOSIS — I480 Paroxysmal atrial fibrillation: Secondary | ICD-10-CM | POA: Diagnosis not present

## 2022-06-02 DIAGNOSIS — N186 End stage renal disease: Secondary | ICD-10-CM | POA: Diagnosis not present

## 2022-06-02 DIAGNOSIS — E118 Type 2 diabetes mellitus with unspecified complications: Secondary | ICD-10-CM | POA: Diagnosis not present

## 2022-06-02 DIAGNOSIS — E1122 Type 2 diabetes mellitus with diabetic chronic kidney disease: Secondary | ICD-10-CM

## 2022-06-02 DIAGNOSIS — M1909 Primary osteoarthritis, other specified site: Secondary | ICD-10-CM | POA: Diagnosis not present

## 2022-06-02 DIAGNOSIS — J9601 Acute respiratory failure with hypoxia: Secondary | ICD-10-CM | POA: Diagnosis not present

## 2022-06-02 DIAGNOSIS — Z515 Encounter for palliative care: Secondary | ICD-10-CM | POA: Diagnosis not present

## 2022-06-02 DIAGNOSIS — Z66 Do not resuscitate: Secondary | ICD-10-CM | POA: Diagnosis not present

## 2022-06-02 DIAGNOSIS — K219 Gastro-esophageal reflux disease without esophagitis: Secondary | ICD-10-CM | POA: Diagnosis not present

## 2022-06-02 MED ORDER — ONDANSETRON HCL 4 MG PO TABS: 4.0000 mg | ORAL_TABLET | Freq: Four times a day (QID) | ORAL | 0 refills | Status: DC | PRN

## 2022-06-05 ENCOUNTER — Telehealth: Payer: Self-pay

## 2022-06-05 DIAGNOSIS — K219 Gastro-esophageal reflux disease without esophagitis: Secondary | ICD-10-CM | POA: Diagnosis not present

## 2022-06-05 DIAGNOSIS — I10 Essential (primary) hypertension: Secondary | ICD-10-CM | POA: Diagnosis not present

## 2022-06-05 DIAGNOSIS — C50912 Malignant neoplasm of unspecified site of left female breast: Secondary | ICD-10-CM | POA: Diagnosis not present

## 2022-06-05 DIAGNOSIS — I129 Hypertensive chronic kidney disease with stage 1 through stage 4 chronic kidney disease, or unspecified chronic kidney disease: Secondary | ICD-10-CM | POA: Diagnosis not present

## 2022-06-05 DIAGNOSIS — J9601 Acute respiratory failure with hypoxia: Secondary | ICD-10-CM | POA: Diagnosis not present

## 2022-06-05 DIAGNOSIS — N185 Chronic kidney disease, stage 5: Secondary | ICD-10-CM | POA: Diagnosis not present

## 2022-06-05 DIAGNOSIS — N186 End stage renal disease: Secondary | ICD-10-CM | POA: Diagnosis not present

## 2022-06-05 DIAGNOSIS — R600 Localized edema: Secondary | ICD-10-CM | POA: Diagnosis not present

## 2022-06-05 DIAGNOSIS — E118 Type 2 diabetes mellitus with unspecified complications: Secondary | ICD-10-CM | POA: Diagnosis not present

## 2022-06-05 NOTE — Telephone Encounter (Signed)
Refill received from [facility] for Amlodipine.  Medication is not on patients medication list.   Reviewed chart - dont see where the medicaion was stopped.   Called paitent to verify of she was taking this medication. LMOV to call back

## 2022-06-05 NOTE — Telephone Encounter (Signed)
Patient's daughter is returning call.

## 2022-06-06 ENCOUNTER — Telehealth (INDEPENDENT_AMBULATORY_CARE_PROVIDER_SITE_OTHER): Payer: Self-pay

## 2022-06-06 ENCOUNTER — Encounter (INDEPENDENT_AMBULATORY_CARE_PROVIDER_SITE_OTHER): Payer: Self-pay

## 2022-06-06 ENCOUNTER — Other Ambulatory Visit: Payer: Self-pay

## 2022-06-06 DIAGNOSIS — C50912 Malignant neoplasm of unspecified site of left female breast: Secondary | ICD-10-CM | POA: Diagnosis not present

## 2022-06-06 DIAGNOSIS — N186 End stage renal disease: Secondary | ICD-10-CM | POA: Diagnosis not present

## 2022-06-06 DIAGNOSIS — J9601 Acute respiratory failure with hypoxia: Secondary | ICD-10-CM | POA: Diagnosis not present

## 2022-06-06 DIAGNOSIS — K219 Gastro-esophageal reflux disease without esophagitis: Secondary | ICD-10-CM | POA: Diagnosis not present

## 2022-06-06 DIAGNOSIS — E118 Type 2 diabetes mellitus with unspecified complications: Secondary | ICD-10-CM | POA: Diagnosis not present

## 2022-06-06 DIAGNOSIS — I10 Essential (primary) hypertension: Secondary | ICD-10-CM | POA: Diagnosis not present

## 2022-06-06 MED ORDER — AMLODIPINE BESYLATE 5 MG PO TABS: 5.0000 mg | ORAL_TABLET | Freq: Every day | ORAL | 3 refills | Status: DC

## 2022-06-06 NOTE — Telephone Encounter (Signed)
Pt's daughter is returning call. Requesting call back.

## 2022-06-06 NOTE — Telephone Encounter (Signed)
Returned patients call - spoke with Daughter Santiago Glad - okay per DPR  She verified that patient was taking amlodipine '5mg'$ . Made patient aware that I would get it back on her list and send in refills for that medication.

## 2022-06-06 NOTE — Telephone Encounter (Signed)
Spoke with the patient's daughter and patient is scheduled with Dr. Lucky Cowboy on 06/08/22 for a right arm fistulagram with [facility] 8:15 am [facility]. Patient's daughter understood.

## 2022-06-08 ENCOUNTER — Encounter
Admission: RE | Disposition: A | Discharge status: Home / Self Care | Payer: Self-pay | Source: Home / Self Care | Attending: Vascular Surgery

## 2022-06-08 ENCOUNTER — Encounter: Payer: Self-pay | Admitting: Vascular Surgery

## 2022-06-08 ENCOUNTER — Ambulatory Visit
Admission: RE | Admit: 2022-06-08 | Discharge: 2022-06-08 | Disposition: A | Discharge status: Home / Self Care | Payer: Medicare Other | Attending: Vascular Surgery | Admitting: Vascular Surgery

## 2022-06-08 ENCOUNTER — Other Ambulatory Visit: Payer: Self-pay

## 2022-06-08 ENCOUNTER — Encounter (INDEPENDENT_AMBULATORY_CARE_PROVIDER_SITE_OTHER): Payer: Self-pay | Admitting: Nurse Practitioner

## 2022-06-08 DIAGNOSIS — T82858A Stenosis of vascular prosthetic devices, implants and grafts, initial encounter: Secondary | ICD-10-CM | POA: Diagnosis not present

## 2022-06-08 DIAGNOSIS — Z87891 Personal history of nicotine dependence: Secondary | ICD-10-CM | POA: Diagnosis not present

## 2022-06-08 DIAGNOSIS — E1122 Type 2 diabetes mellitus with diabetic chronic kidney disease: Secondary | ICD-10-CM | POA: Insufficient documentation

## 2022-06-08 DIAGNOSIS — Y832 Surgical operation with anastomosis, bypass or graft as the cause of abnormal reaction of the patient, or of later complication, without mention of misadventure at the time of the procedure: Secondary | ICD-10-CM | POA: Insufficient documentation

## 2022-06-08 DIAGNOSIS — N186 End stage renal disease: Secondary | ICD-10-CM | POA: Insufficient documentation

## 2022-06-08 DIAGNOSIS — I771 Stricture of artery: Secondary | ICD-10-CM

## 2022-06-08 DIAGNOSIS — I120 Hypertensive chronic kidney disease with stage 5 chronic kidney disease or end stage renal disease: Secondary | ICD-10-CM | POA: Diagnosis not present

## 2022-06-08 DIAGNOSIS — I70208 Unspecified atherosclerosis of native arteries of extremities, other extremity: Secondary | ICD-10-CM | POA: Diagnosis not present

## 2022-06-08 DIAGNOSIS — T82898A Other specified complication of vascular prosthetic devices, implants and grafts, initial encounter: Secondary | ICD-10-CM

## 2022-06-08 HISTORY — PX: A/V FISTULAGRAM: CATH118298

## 2022-06-08 LAB — GLUCOSE, CAPILLARY: Glucose-Capillary: 108 mg/dL — ABNORMAL HIGH (ref 70–99)

## 2022-06-08 LAB — POTASSIUM (ARMC VASCULAR LAB ONLY): Potassium (ARMC vascular lab): 4 mmol/L (ref 3.5–5.1)

## 2022-06-08 SURGERY — A/V FISTULAGRAM
Anesthesia: Moderate Sedation | Laterality: Right

## 2022-06-08 MED ORDER — CEFAZOLIN SODIUM-DEXTROSE 1-4 GM/50ML-% IV SOLN
INTRAVENOUS | Status: AC
  Administered 2022-06-08: 1 g via INTRAVENOUS
  Filled 2022-06-08: qty 50

## 2022-06-08 MED ORDER — MIDAZOLAM HCL 2 MG/2ML IJ SOLN
INTRAMUSCULAR | Status: DC | PRN
  Administered 2022-06-08: 1 mg via INTRAVENOUS

## 2022-06-08 MED ORDER — MIDAZOLAM HCL 2 MG/ML PO SYRP: 8.0000 mg | ORAL_SOLUTION | Freq: Once | ORAL | Status: DC | PRN

## 2022-06-08 MED ORDER — DIPHENHYDRAMINE HCL 50 MG/ML IJ SOLN: 50.0000 mg | Freq: Once | INTRAMUSCULAR | Status: DC | PRN

## 2022-06-08 MED ORDER — FENTANYL CITRATE (PF) 100 MCG/2ML IJ SOLN
INTRAMUSCULAR | Status: DC | PRN
  Administered 2022-06-08: 50 ug via INTRAVENOUS

## 2022-06-08 MED ORDER — CEFAZOLIN SODIUM-DEXTROSE 1-4 GM/50ML-% IV SOLN: 1.0000 g | INTRAVENOUS | Status: AC

## 2022-06-08 MED ORDER — FAMOTIDINE 20 MG PO TABS: 40.0000 mg | ORAL_TABLET | Freq: Once | ORAL | Status: DC | PRN

## 2022-06-08 MED ORDER — MIDAZOLAM HCL 2 MG/2ML IJ SOLN
INTRAMUSCULAR | Status: AC
  Filled 2022-06-08: qty 2

## 2022-06-08 MED ORDER — HEPARIN SODIUM (PORCINE) 1000 UNIT/ML IJ SOLN
INTRAMUSCULAR | Status: DC | PRN
  Administered 2022-06-08: 3000 [IU] via INTRAVENOUS

## 2022-06-08 MED ORDER — SODIUM CHLORIDE 0.9 % IV SOLN: INTRAVENOUS | Status: DC

## 2022-06-08 MED ORDER — HEPARIN SODIUM (PORCINE) 1000 UNIT/ML IJ SOLN
INTRAMUSCULAR | Status: AC
  Filled 2022-06-08: qty 10

## 2022-06-08 MED ORDER — METHYLPREDNISOLONE SODIUM SUCC 125 MG IJ SOLR: 125.0000 mg | Freq: Once | INTRAMUSCULAR | Status: DC | PRN

## 2022-06-08 MED ORDER — ONDANSETRON HCL 4 MG/2ML IJ SOLN: 4.0000 mg | Freq: Four times a day (QID) | INTRAMUSCULAR | Status: DC | PRN

## 2022-06-08 MED ORDER — HYDROMORPHONE HCL 1 MG/ML IJ SOLN: 1.0000 mg | Freq: Once | INTRAMUSCULAR | Status: DC | PRN

## 2022-06-08 MED ORDER — FENTANYL CITRATE (PF) 100 MCG/2ML IJ SOLN
INTRAMUSCULAR | Status: AC
  Filled 2022-06-08: qty 2

## 2022-06-08 SURGICAL SUPPLY — 21 items
BALLN ULTRVRSE 4X60X75 (BALLOONS) ×1
BALLOON ULTRVRSE 4X60X75 (BALLOONS) IMPLANT
CATH BEACON 5 .035 40 KMP TP (CATHETERS) IMPLANT
CATH BEACON 5 .038 40 KMP TP (CATHETERS) ×1
CATH MICROCATH PRGRT 2.8F 110 (CATHETERS) IMPLANT
DEVICE OCCLUSION POD8 (Vascular Products) IMPLANT
DEVICE OCCLUSION PODJ15 (Vascular Products) IMPLANT
DRAPE BRACHIAL (DRAPES) IMPLANT
GLIDEWIRE ADV .035X180CM (WIRE) IMPLANT
HANDLE DETACHMENT COIL (MISCELLANEOUS) IMPLANT
KIT ENCORE 26 ADVANTAGE (KITS) IMPLANT
KIT MICROPUNCTURE NIT STIFF (SHEATH) IMPLANT
MICROCATH PROGREAT 2.8F 110 CM (CATHETERS) ×1
NDL ENTRY 21GA 7CM ECHOTIP (NEEDLE) IMPLANT
NEEDLE ENTRY 21GA 7CM ECHOTIP (NEEDLE) ×1 IMPLANT
OCCLUSION DEVICE POD8 (Vascular Products) ×1 IMPLANT
OCCLUSION DEVICE PODJ15 (Vascular Products) ×2 IMPLANT
PACK ANGIOGRAPHY (CUSTOM PROCEDURE TRAY) ×1 IMPLANT
SHEATH BRITE TIP 6FRX5.5 (SHEATH) IMPLANT
SUT MNCRL AB 4-0 PS2 18 (SUTURE) IMPLANT
WIRE G 018X200 V18 (WIRE) IMPLANT

## 2022-06-08 NOTE — Interval H&P Note (Signed)
History and Physical Interval Note:  06/08/2022 8:55 AM  Alexis Tucker  has presented today for surgery, with the diagnosis of R arm fistulgram   End Stage Renal.  The various methods of treatment have been discussed with the patient and family. After consideration of risks, benefits and other options for treatment, the patient has consented to  Procedure(s): A/V Fistulagram (Right) as a surgical intervention.  The patient's history has been reviewed, patient examined, no change in status, stable for surgery.  I have reviewed the patient's chart and labs.  Questions were answered to the patient's satisfaction.     Leotis Pain

## 2022-06-08 NOTE — Op Note (Signed)
[facility]    OPERATIVE NOTE   PROCEDURE: 1.  Right brachial artery to axillary vein arteriovenous graft cannulation under ultrasound guidance 2.  Catheter placement into right distal brachial artery and right upper extremity angiogram 3.  Percutaneous transluminal angioplasty of distal right brachial artery with 4 mm diameter angioplasty balloon 4.  Right upper extremity shuntogram 5.  Coil embolization of the AVG with a total of three Ruby coils.  PRE-OPERATIVE DIAGNOSIS: 1. ESRD 2. Malfunctioning right brachial artery to axillary vein arteriovenous graft with large seroma 3.  Steal syndrome right arm  POST-OPERATIVE DIAGNOSIS: same as above   SURGEON: Leotis Pain, MD  ANESTHESIA: local with MCS  ESTIMATED BLOOD LOSS: 5 cc  FINDING(S): Axillary vein occluded proximal to the AV graft creating a high pressure state with multiple collaterals draining the AV graft.  70% stenosis of the brachial artery distal to the AV graft with occlusion of the radial and ulnar arteries creating steal syndrome  SPECIMEN(S):  None  CONTRAST: 35 cc  FLUORO TIME: 6.5 minutes  MODERATE CONSCIOUS SEDATION TIME:  Approximately 28 minutes using 1 mg of Versed and 50 mcg of Fentanyl  INDICATIONS: Alexis Tucker is a 81 y.o. female who presents with malfunctioning right brachial artery to axillary vein arteriovenous graft with a large seroma as well as steal syndrome in the right hand.  The patient is scheduled for right arm angiogram as well as right arm shuntogram.  The patient is aware the risks include but are not limited to: bleeding, infection, thrombosis of the cannulated access, and possible anaphylactic reaction to the contrast.  The patient is aware of the risks of the procedure and elects to proceed forward.  DESCRIPTION: After full informed written consent was obtained, the patient was brought back to the angiography suite and placed supine upon the angiography table.   The patient was connected to monitoring equipment. Moderate conscious sedation was administered during a face to face encounter throughout the procedure with my supervision of the RN administering medicines and monitoring the patient's vital signs, pulse oximetry, telemetry and mental status throughout from the start of the procedure until the patient was taken to the recovery room The right arm was prepped and draped in the standard fashion for a percutaneous access intervention.  Under ultrasound guidance, the right brachial artery to axillary vein arteriovenous graft was cannulated in a retrograde fashion with a micropuncture needle under direct ultrasound guidance were it was patent and a permanent image was performed.  The microwire was advanced into the graft and the needle was exchanged for the a microsheath.  I then upsized to a 6 Fr Sheath and imaging was performed.  Initial imaging through the sheath showed the axillary vein to be occluded proximal to the AV graft with innumerable venous collaterals providing outflow.  The graft itself was patent.  It was difficult to discern is essentially minimal flow was going distally to the hand to evaluate the arterial perfusion which was her primary issue.  A Kumpe catheter and advantage wire were then advanced across the the AV graft arterial anastomosis into the distal brachial artery.  There was about a 70 to 80% stenosis of the brachial artery distal to the anastomosis.  The radial artery was occluded.  The ulnar artery was occluded.  The only outflow was the interosseous artery which was also predispose her to steal syndrome.  The patient is not using the AV graft and the large seroma is very painful and  at this point elimination of the flow in the graft will be very helpful as well as treatment of the arterial disease distal to the graft.  Based on the images, this patient will need coil embolization of the AV graft and intervention to the brachial artery. I  then gave the patient 3000 units of intravenous heparin.  I then placed a V18 wire in the interosseous artery.  Based on the imaging, a 4 mm x 6 cm  angioplasty balloon was selected.  The balloon was centered around the brachial artery stenosis and inflated to 8 ATM for 1 minute(s).  On completion imaging, a 20% residual stenosis was present.   I then turned my attention to a limiting flow within the AV graft.  A prograde microcatheter was placed about 1 to 2 cm from the arterial anastomosis of the AV graft within the AV graft.  I then placed a pod 8 Ruby coil followed by two 15 cm packing coils to completely coil embolized the AV graft and reduced flow through the graft which should help her steal syndrome.  Based on the completion imaging, no further intervention is necessary.  The progreat catheter was removed. A 4-0 Monocryl purse-string suture was sewn around the sheath.  The sheath was removed while tying down the suture.  A sterile bandage was applied to the puncture site.  COMPLICATIONS: None  CONDITION: Stable   Leotis Pain  06/08/2022 10:12 AM    This note was created with Dragon Medical transcription system. Any errors in dictation are purely unintentional.

## 2022-06-08 NOTE — H&P (View-Only) (Signed)
Subjective:    Patient ID: Alexis Tucker, female    DOB: [birth year], 81 y.o.   MRN: [id number] Chief Complaint  Patient presents with   Follow-up    2 week follow up    Alexis Tucker is a 81 year old female who recently underwent intervention on her right brachiocephalic AV graft due to concern that she may have a pseudoaneurysm.  However it is found that the area does not have bleeding that it is not a true pseudoaneurysm.  However it was noted that her brachial artery is occluded beyond the AV graft.  At that time her arch was very tortuous which made it not amenable to intervention at that time.  It was also noted that her venous anastomosis is occluded which is making the fluid collection and swelling much worse.  She is also noted to have steal in her right upper extremity which is consistent with the symptoms that she is currently having.  Previously, the patient had made the decision not to move forward with dialysis however she has ultimately decided that if the time comes, she is open to dialysis.    Review of Systems  Neurological:  Positive for weakness and numbness.  All other systems reviewed and are negative.      Objective:   Physical Exam Vitals reviewed.  HENT:     Head: Normocephalic.  Cardiovascular:     Rate and Rhythm: Normal rate.     Pulses:          Radial pulses are 0 on the right side.     Arteriovenous access: Right arteriovenous access is present.    Comments: Mass in right antecubital fossa area Pulmonary:     Effort: Pulmonary effort is normal.  Musculoskeletal:        General: Swelling and tenderness present.  Skin:    General: Skin is warm and dry.  Neurological:     Mental Status: She is alert and oriented to person, place, and time.     Gait: Gait abnormal.  Psychiatric:        Mood and Affect: Mood normal.        Behavior: Behavior normal.        Thought Content: Thought content normal.        Judgment: Judgment normal.     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   Coronary artery calcification  seen on CT scan    CVA (cerebral vascular accident) ([place])    a.) remotes infarct(s) noted on CT imaging from 08/17/2017 --> RIGHT parietotemporal infarct; lacunar infarct LEFT pons   Diabetes type 2, controlled ([place]) 2000s   Diabetic retinopathy with macular edema ([place]) 03/2014   a.) Avastin injections as [place]   DJD (degenerative joint disease)    ESRD (end stage renal disease) ([place])    GERD (gastroesophageal reflux disease)    Glaucoma 03/2014   History of colon cancer 2006   a.) s/p colectomy   History of hepatitis A 1990s   from food, resolved   HTN (hypertension)    Hyperplastic colon polyp    Long term current use of anticoagulant    a.) apixaban   Long term current use of antithrombotics/antiplatelets    a.) pentoxifylline   Macular degeneration    wet R with vision  loss, dry L; to see retina specialist   Neuropathy    OA (osteoarthritis)    Osteonecrosis (HCC)    hips?   PONV (postoperative nausea and vomiting)    Post-surgical hypothyroidism    Thrombocytopenia (HCC)    Tubular adenoma    Wears dentures    partial upper    Social History   Socioeconomic History   Marital status: Divorced    Spouse name: Not on file   Number of children: 1   Years of education: Not on file   Highest education level: Not on file  Occupational History   Not on file  Tobacco Use   Smoking status: Former   Smokeless tobacco: Never   Tobacco comments:    quit over 40 yrs ago  Vaping Use   Vaping Use: Never used  Substance and Sexual Activity   Alcohol use: Never    Comment: rare   Drug use: Never   Sexual activity: Not Currently  Other Topics Concern   Not on file  Social History Narrative   Lives alone,   Granddaughter in [place] (attorney).   Divorced   Occu: retired, worked in Engineer, technical sales   Edu: college   Has 1 living daughter. (One passed)   Social Determinants of Health   Financial Resource Strain: Low Risk  (12/28/2021)   Overall Financial Resource Strain (CARDIA)    Difficulty of Paying Living Expenses: Not hard at all  Food Insecurity: No Food Insecurity (04/24/2022)   Hunger Vital Sign    Worried About Running Out of Food in the Last Year: Never true    Ran Out of Food in the Last Year: Never true  Transportation Needs: No Transportation Needs (04/24/2022)   PRAPARE - Hydrologist (Medical): No    Lack of Transportation (Non-Medical): No  Physical Activity: Inactive (12/28/2021)   Exercise Vital Sign    Days of Exercise per Week: 0 days    Minutes of Exercise per Session: 0 min  Stress: No Stress Concern Present (12/28/2021)   [place]    Feeling of Stress : Not at all  Social Connections: Not on file  Intimate Partner Violence: Not At Risk  (04/24/2022)   Humiliation, Afraid, Rape, and Kick questionnaire    Fear of Current or Ex-Partner: No    Emotionally Abused: No    Physically Abused: No    Sexually Abused: No    Past Surgical History:  Procedure Laterality Date   ABDOMINAL HYSTERECTOMY  1976   partial,  after tubal pregnancy, h/o fibroids with heavy bleeding   AV FISTULA PLACEMENT Right 02/01/2022   Procedure: INSERTION OF ARTERIOVENOUS (AV) GORE-TEX GRAFT ARM (LEFT UPPER EXTREMITY);  Surgeon: Algernon Huxley, MD;  Location: [facility] ORS;  Service: Vascular;  Laterality: Right;   BREAST BIOPSY Left 2008   positive   BREAST BIOPSY Left 11/22/2017   2oclock 2cmfn wing shaped clip, INVASIVE MAMMARY CARCINOMA   BREAST BIOPSY Left 11/22/2017   2oclock 4cmfn coil shaped clip, INVASIVE MAMMARY CARCINOMA   BREAST BIOPSY Left 11/22/2017   lymph node - butterfly hydroMARK   BREAST EXCISIONAL BIOPSY Left 01/07/2018   lumpectomy by Dr. Bary Castilla   BREAST LUMPECTOMY Left 2008   F/U radiation   BREAST LUMPECTOMY Left 2019   2 areas of [facility] at 2:00 position f/u with mammosite   BREAST LUMPECTOMY WITH SENTINEL LYMPH NODE BIOPSY Left 01/07/2018   Procedure: BREAST LUMPECTOMY WITH SENTINEL LYMPH NODE BX;  Surgeon: Robert Bellow, MD;  Location: [facility] OR[facility];  Service: General;  Laterality: Left;   BREAST MAMMOSITE  2008   carotid ultrasound  12/2012   1-39% bilateral stenosis, severe in ECA   CATARACT EXTRACTION W/PHACO Left 08/14/2016   Procedure: CATARACT EXTRACTION PHACO AND INTRAOCULAR LENS PLACEMENT ([place])  Left Diabetic;  Surgeon: Ronnell Freshwater, MD;  Location: [place];  Service: Ophthalmology;  Laterality: Left;  Diabetic - oral meds   CHOLECYSTECTOMY  2005   COLECTOMY  2005   colon cancer   COLON SURGERY  2006   bowel obstruction   COLONOSCOPY  2008   ext hem, ileo-colonic anastomosis in cecum, tubular adenoma x1, rec rpt 1 yr for multiple polyps (in [place])   COLONOSCOPY WITH PROPOFOL N/A 10/19/2021   2.5cm TVA  with low grade dysplasia, diverticulosis, no rpt recommended Vicente Males, Bailey Mech, MD)   ESOPHAGOGASTRODUODENOSCOPY N/A 05/06/2016   Procedure: ESOPHAGOGASTRODUODENOSCOPY (EGD);  Surgeon: Manya Silvas, MD;  Location: [facility];  Service: Endoscopy;  Laterality: N/A;   EYE SURGERY     JOINT REPLACEMENT     THYROIDECTOMY, PARTIAL     unclear why   TOTAL KNEE ARTHROPLASTY Right 1999   UPPER EXTREMITY ANGIOGRAPHY Right 05/18/2022   Procedure: Upper Extremity Angiography;  Surgeon: Algernon Huxley, MD;  Location: [place] [facility];  Service: Cardiovascular;  Laterality: Right;   US ECHOCARDIOGRAPHY  11/2007   nl LV fxn EF 29%, diastolic dysfunction, LVH, tr TR and MR, slcerotic aortic valve    Family History  Problem Relation Age of Onset   Diabetes Mother    Hypertension Mother    Arthritis Mother    Alcohol abuse Father    Arthritis Father    Stroke Daughter    CAD Neg Hx    Cancer Neg Hx    Breast cancer Neg Hx     Allergies  Allergen Reactions   Tramadol Other (See Comments)    nightmares   Codeine Anxiety   Sulfa Antibiotics Itching and Rash       Latest Ref Rng & Units 04/24/2022    9:00 AM 04/23/2022    6:12 AM 04/22/2022   12:01 PM  CBC  WBC 4.0 - 10.5 K/uL 6.0  9.2  9.0   Hemoglobin 12.0 - 15.0 g/dL 8.0  7.5  8.7   Hematocrit 36.0 - 46.0 % 25.1  24.4  29.2   Platelets 150 - 400 K/uL 100  94  112       CMP     Component Value Date/Time  NA 142 04/25/2022 0445   NA 140 10/16/2012 1227   K 3.5 04/25/2022 0445   K 4.3 02/26/2014 0000   CL 108 04/25/2022 0445   CL 106 10/16/2012 1227   CO2 25 04/25/2022 0445   CO2 24 10/16/2012 1227   GLUCOSE 100 (H) 04/25/2022 0445   GLUCOSE 262 (H) 10/16/2012 1227   BUN 45 (H) 04/25/2022 0445   BUN 22 (H) 10/16/2012 1227   CREATININE 5.60 (H) 04/25/2022 0445   CREATININE 4.05 02/26/2014 0000   CALCIUM 8.8 (L) 04/25/2022 0445   CALCIUM 9.0 10/16/2012 1227   PROT 7.5 11/15/2021 1722   PROT 7.9 10/16/2012 1227    ALBUMIN 3.8 11/25/2021 1217   ALBUMIN 3.9 10/16/2012 1227   AST 15 11/15/2021 1722   AST 19 10/16/2012 1227   AST 14 01/11/2012 0000   ALT 10 11/15/2021 1722   ALT 21 10/16/2012 1227   ALKPHOS 70 11/15/2021 1722   ALKPHOS 106 10/16/2012 1227   ALKPHOS 91 01/11/2012 0000   BILITOT 0.5 11/15/2021 1722   BILITOT 0.4 10/16/2012 1227   BILITOT 0.4 01/11/2012 0000   GFRNONAA 7 (L) 04/25/2022 0445   GFRAA 10 (L) 01/07/2018 0859     No results found.     Assessment & Plan:   1. Primary hypertension Continue antihypertensive medications as already ordered, these medications have been reviewed and there are no changes at this time.  2. Type 2 diabetes mellitus with other specified complication, without long-term current use of insulin (HCC) Continue hypoglycemic medications as already ordered, these medications have been reviewed and there are no changes at this time.  Hgb A1C to be monitored as already arranged by primary service  3. CKD stage 5 due to type 2 diabetes mellitus ([place]) Currently the patient is not yet on dialysis.  While she is nearing end-stage renal disease levels she is not quite on dialysis just yet.  We recommended the patient undergo a fistulogram.  During the fistulogram we will attempt to cross the distal brachial occlusion and open the area.  There is a possibility that radial access may be necessary.  We will also plan on embolizing the graft to decrease the pressure, remove the swelling, and treat her steal syndrome.  Following a discussion with her nephrologist, Dr. Candiss Norse, they recommend not placing a PermCath during her procedure and waiting until it will be imminently necessary to begin dialysis.  We have discussed the risk, benefits and alternatives and the patient is agreeable to proceed.     Current Outpatient Medications on File Prior to Visit  Medication Sig Dispense Refill   allopurinol (ZYLOPRIM) 100 MG tablet Take 1 tablet (100 mg total) by mouth  daily. 90 tablet 3   apixaban (ELIQUIS) 5 MG TABS tablet Take 1 tablet (5 mg total) by mouth 2 (two) times daily. 60 tablet 6   Blood Glucose Monitoring Suppl (ACURA BLOOD GLUCOSE METER) W/DEVICE KIT Use as directed to check sugars daily 250.42 1 kit 0   CALCIUM-MAGNESIUM PO Take 1 tablet by mouth daily.     carvedilol (COREG) 25 MG tablet Take 0.5 tablets (12.5 mg total) by mouth 2 (two) times daily with a meal. 30 tablet 2   Cholecalciferol (VITAMIN D-3) 5000 UNITS TABS Take 1 tablet by mouth daily.     isosorbide dinitrate (ISORDIL) 20 MG tablet Take 1 tablet (20 mg total) by mouth daily. 90 tablet 3   levothyroxine (SYNTHROID) 88 MCG tablet TAKE 1 TABLET BY MOUTH EVERY DAY  90 tablet 3   pantoprazole (PROTONIX) 40 MG tablet Take 1 tablet (40 mg total) by mouth daily as needed. 90 tablet 1   pentoxifylline (TRENTAL) 400 MG CR tablet Take 1 tablet (400 mg total) by mouth daily. 90 tablet 3   senna-docusate (SENOKOT-S) 8.6-50 MG tablet Take 1 tablet by mouth at bedtime as needed for mild constipation. 30 tablet 0   torsemide (DEMADEX) 20 MG tablet Take 3 tablets (60 mg total) by mouth daily. 270 tablet 0   anastrozole (ARIMIDEX) 1 MG tablet Take 1 tablet (1 mg total) by mouth daily. (Patient not taking: Reported on 05/18/2022) 90 tablet 1   No current facility-administered medications on file prior to visit.    There are no Patient Instructions on file for this visit. No follow-ups on file.   Kris Hartmann, NP

## 2022-06-08 NOTE — Progress Notes (Signed)
Subjective:    Patient ID: Alexis Tucker, female    DOB: [birth year], 81 y.o.   MRN: [id number] Chief Complaint  Patient presents with   Follow-up    2 week follow up    Alexis Tucker is a 81 year old female who recently underwent intervention on her right brachiocephalic AV graft due to concern that she may have a pseudoaneurysm.  However it is found that the area does not have bleeding that it is not a true pseudoaneurysm.  However it was noted that her brachial artery is occluded beyond the AV graft.  At that time her arch was very tortuous which made it not amenable to intervention at that time.  It was also noted that her venous anastomosis is occluded which is making the fluid collection and swelling much worse.  She is also noted to have steal in her right upper extremity which is consistent with the symptoms that she is currently having.  Previously, the patient had made the decision not to move forward with dialysis however she has ultimately decided that if the time comes, she is open to dialysis.    Review of Systems  Neurological:  Positive for weakness and numbness.  All other systems reviewed and are negative.      Objective:   Physical Exam Vitals reviewed.  HENT:     Head: Normocephalic.  Cardiovascular:     Rate and Rhythm: Normal rate.     Pulses:          Radial pulses are 0 on the right side.     Arteriovenous access: Right arteriovenous access is present.    Comments: Mass in right antecubital fossa area Pulmonary:     Effort: Pulmonary effort is normal.  Musculoskeletal:        General: Swelling and tenderness present.  Skin:    General: Skin is warm and dry.  Neurological:     Mental Status: She is alert and oriented to person, place, and time.     Gait: Gait abnormal.  Psychiatric:        Mood and Affect: Mood normal.        Behavior: Behavior normal.        Thought Content: Thought content normal.        Judgment: Judgment normal.     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   a.) Avastin injections as [facility]   DJD (degenerative joint disease)    ESRD (end stage renal disease) (HCC)    GERD (gastroesophageal reflux disease)    Glaucoma 03/2014   History of colon cancer 2006   a.) s/p colectomy   History of hepatitis A 1990s   from food, resolved   HTN (hypertension)    Hyperplastic colon polyp    Long term current use of anticoagulant    a.) apixaban   Long term current use of antithrombotics/antiplatelets    a.) pentoxifylline   Macular degeneration    wet R with vision  loss, dry L; to see retina specialist   Neuropathy    OA (osteoarthritis)    Osteonecrosis (HCC)    hips?   PONV (postoperative nausea and vomiting)    Post-surgical hypothyroidism    Thrombocytopenia (HCC)    Tubular adenoma    Wears dentures    partial upper    Social History   Socioeconomic History   Marital status: Divorced    Spouse name: Not on file   Number of children: 1   Years of education: Not on file   Highest education level: Not on file  Occupational History   Not on file  Tobacco Use   Smoking status: Former   Smokeless tobacco: Never   Tobacco comments:    quit over 40 yrs ago  Vaping Use   Vaping Use: Never used  Substance and Sexual Activity   Alcohol use: Never    Comment: rare   Drug use: Never   Sexual activity: Not Currently  Other Topics Concern   Not on file  Social History Narrative   Lives alone,   Granddaughter in [place] (attorney).   Divorced   Occu: retired, worked in IT   Edu: college   Has 1 living daughter. (One passed)   Social Determinants of Health   Financial Resource Strain: Low Risk  (12/28/2021)   Overall Financial Resource Strain (CARDIA)    Difficulty of Paying Living Expenses: Not hard at all  Food Insecurity: No Food Insecurity (04/24/2022)   Hunger Vital Sign    Worried About Running Out of Food in the Last Year: Never true    Ran Out of Food in the Last Year: Never true  Transportation Needs: No Transportation Needs (04/24/2022)   PRAPARE - Transportation    Lack of Transportation (Medical): No    Lack of Transportation (Non-Medical): No  Physical Activity: Inactive (12/28/2021)   Exercise Vital Sign    Days of Exercise per Week: 0 days    Minutes of Exercise per Session: 0 min  Stress: No Stress Concern Present (12/28/2021)   [employer] - Occupational Stress Questionnaire    Feeling of Stress : Not at all  Social Connections: Not on file  Intimate Partner Violence: Not At Risk  (04/24/2022)   Humiliation, Afraid, Rape, and Kick questionnaire    Fear of Current or Ex-Partner: No    Emotionally Abused: No    Physically Abused: No    Sexually Abused: No    Past Surgical History:  Procedure Laterality Date   ABDOMINAL HYSTERECTOMY  1976   partial,   after tubal pregnancy, h/o fibroids with heavy bleeding   AV FISTULA PLACEMENT Right 02/01/2022   Procedure: INSERTION OF ARTERIOVENOUS (AV) GORE-TEX GRAFT ARM (LEFT UPPER EXTREMITY);  Surgeon: Algernon Huxley, MD;  Location: [facility] ORS;  Service: Vascular;  Laterality: Right;   BREAST BIOPSY Left 2008   positive   BREAST BIOPSY Left 11/22/2017   2oclock 2cmfn wing shaped clip, INVASIVE MAMMARY CARCINOMA   BREAST BIOPSY Left 11/22/2017   2oclock 4cmfn coil shaped clip, INVASIVE MAMMARY CARCINOMA   BREAST BIOPSY Left 11/22/2017   lymph node - butterfly hydroMARK   BREAST EXCISIONAL BIOPSY Left 01/07/2018   lumpectomy by Dr. Bary Castilla   BREAST LUMPECTOMY Left 2008   F/U radiation   BREAST LUMPECTOMY Left 2019   2 areas of [facility] at 2:00 position f/u with mammosite   BREAST LUMPECTOMY WITH SENTINEL LYMPH NODE BIOPSY Left 01/07/2018   Procedure: BREAST LUMPECTOMY WITH SENTINEL LYMPH NODE BX;  Surgeon: Robert Bellow, MD;  Location: [facility] OR[facility];  Service: General;  Laterality: Left;   BREAST MAMMOSITE  2008   carotid ultrasound  12/2012   1-39% bilateral stenosis, severe in ECA   CATARACT EXTRACTION W/PHACO Left 08/14/2016   Procedure: CATARACT EXTRACTION PHACO AND INTRAOCULAR LENS PLACEMENT ([place])  Left Diabetic;  Surgeon: Ronnell Freshwater, MD;  Location: [facility];  Service: Ophthalmology;  Laterality: Left;  Diabetic - oral meds   CHOLECYSTECTOMY  2005   COLECTOMY  2005   colon cancer   COLON SURGERY  2006   bowel obstruction   COLONOSCOPY  2008   ext hem, ileo-colonic anastomosis in cecum, tubular adenoma x1, rec rpt 1 yr for multiple polyps (in [place])   COLONOSCOPY WITH PROPOFOL N/A 10/19/2021   2.5cm TVA  with low grade dysplasia, diverticulosis, no rpt recommended Vicente Males, Bailey Mech, MD)   ESOPHAGOGASTRODUODENOSCOPY N/A 05/06/2016   Procedure: ESOPHAGOGASTRODUODENOSCOPY (EGD);  Surgeon: Manya Silvas, MD;  Location: [facility];  Service: Endoscopy;  Laterality: N/A;   EYE SURGERY     JOINT REPLACEMENT     THYROIDECTOMY, PARTIAL     unclear why   TOTAL KNEE ARTHROPLASTY Right 1999   UPPER EXTREMITY ANGIOGRAPHY Right 05/18/2022   Procedure: Upper Extremity Angiography;  Surgeon: Algernon Huxley, MD;  Location: [facility];  Service: Cardiovascular;  Laterality: Right;   US ECHOCARDIOGRAPHY  11/2007   nl LV fxn EF 47%, diastolic dysfunction, LVH, tr TR and MR, slcerotic aortic valve    Family History  Problem Relation Age of Onset   Diabetes Mother    Hypertension Mother    Arthritis Mother    Alcohol abuse Father    Arthritis Father    Stroke Daughter    CAD Neg Hx    Cancer Neg Hx    Breast cancer Neg Hx     Allergies  Allergen Reactions   Tramadol Other (See Comments)    nightmares   Codeine Anxiety   Sulfa Antibiotics Itching and Rash       Latest Ref Rng & Units 04/24/2022    9:00 AM 04/23/2022    6:12 AM 04/22/2022   12:01 PM  CBC  WBC 4.0 - 10.5 K/uL 6.0  9.2  9.0   Hemoglobin 12.0 - 15.0 g/dL 8.0  7.5  8.7   Hematocrit 36.0 - 46.0 % 25.1  24.4  29.2   Platelets 150 - 400 K/uL 100  94  112       CMP     Component Value Date/Time  NA 142 04/25/2022 0445   NA 140 10/16/2012 1227   K 3.5 04/25/2022 0445   K 4.3 02/26/2014 0000   CL 108 04/25/2022 0445   CL 106 10/16/2012 1227   CO2 25 04/25/2022 0445   CO2 24 10/16/2012 1227   GLUCOSE 100 (H) 04/25/2022 0445   GLUCOSE 262 (H) 10/16/2012 1227   BUN 45 (H) 04/25/2022 0445   BUN 22 (H) 10/16/2012 1227   CREATININE 5.60 (H) 04/25/2022 0445   CREATININE 4.05 02/26/2014 0000   CALCIUM 8.8 (L) 04/25/2022 0445   CALCIUM 9.0 10/16/2012 1227   PROT 7.5 11/15/2021 1722   PROT 7.9 10/16/2012 1227    ALBUMIN 3.8 11/25/2021 1217   ALBUMIN 3.9 10/16/2012 1227   AST 15 11/15/2021 1722   AST 19 10/16/2012 1227   AST 14 01/11/2012 0000   ALT 10 11/15/2021 1722   ALT 21 10/16/2012 1227   ALKPHOS 70 11/15/2021 1722   ALKPHOS 106 10/16/2012 1227   ALKPHOS 91 01/11/2012 0000   BILITOT 0.5 11/15/2021 1722   BILITOT 0.4 10/16/2012 1227   BILITOT 0.4 01/11/2012 0000   GFRNONAA 7 (L) 04/25/2022 0445   GFRAA 10 (L) 01/07/2018 0859     No results found.     Assessment & Plan:   1. Primary hypertension Continue antihypertensive medications as already ordered, these medications have been reviewed and there are no changes at this time.  2. Type 2 diabetes mellitus with other specified complication, without long-term current use of insulin (HCC) Continue hypoglycemic medications as already ordered, these medications have been reviewed and there are no changes at this time.  Hgb A1C to be monitored as already arranged by primary service  3. CKD stage 5 due to type 2 diabetes mellitus ([place]) Currently the patient is not yet on dialysis.  While she is nearing end-stage renal disease levels she is not quite on dialysis just yet.  We recommended the patient undergo a fistulogram.  During the fistulogram we will attempt to cross the distal brachial occlusion and open the area.  There is a possibility that radial access may be necessary.  We will also plan on embolizing the graft to decrease the pressure, remove the swelling, and treat her steal syndrome.  Following a discussion with her nephrologist, Dr. Candiss Norse, they recommend not placing a PermCath during her procedure and waiting until it will be imminently necessary to begin dialysis.  We have discussed the risk, benefits and alternatives and the patient is agreeable to proceed.     Current Outpatient Medications on File Prior to Visit  Medication Sig Dispense Refill   allopurinol (ZYLOPRIM) 100 MG tablet Take 1 tablet (100 mg total) by mouth  daily. 90 tablet 3   apixaban (ELIQUIS) 5 MG TABS tablet Take 1 tablet (5 mg total) by mouth 2 (two) times daily. 60 tablet 6   Blood Glucose Monitoring Suppl (ACURA BLOOD GLUCOSE METER) W/DEVICE KIT Use as directed to check sugars daily 250.42 1 kit 0   CALCIUM-MAGNESIUM PO Take 1 tablet by mouth daily.     carvedilol (COREG) 25 MG tablet Take 0.5 tablets (12.5 mg total) by mouth 2 (two) times daily with a meal. 30 tablet 2   Cholecalciferol (VITAMIN D-3) 5000 UNITS TABS Take 1 tablet by mouth daily.     isosorbide dinitrate (ISORDIL) 20 MG tablet Take 1 tablet (20 mg total) by mouth daily. 90 tablet 3   levothyroxine (SYNTHROID) 88 MCG tablet TAKE 1 TABLET BY MOUTH EVERY DAY  90 tablet 3   pantoprazole (PROTONIX) 40 MG tablet Take 1 tablet (40 mg total) by mouth daily as needed. 90 tablet 1   pentoxifylline (TRENTAL) 400 MG CR tablet Take 1 tablet (400 mg total) by mouth daily. 90 tablet 3   senna-docusate (SENOKOT-S) 8.6-50 MG tablet Take 1 tablet by mouth at bedtime as needed for mild constipation. 30 tablet 0   torsemide (DEMADEX) 20 MG tablet Take 3 tablets (60 mg total) by mouth daily. 270 tablet 0   anastrozole (ARIMIDEX) 1 MG tablet Take 1 tablet (1 mg total) by mouth daily. (Patient not taking: Reported on 05/18/2022) 90 tablet 1   No current facility-administered medications on file prior to visit.    There are no Patient Instructions on file for this visit. No follow-ups on file.   Kris Hartmann, NP

## 2022-06-09 ENCOUNTER — Encounter: Payer: Self-pay | Admitting: Vascular Surgery

## 2022-06-12 DIAGNOSIS — J9601 Acute respiratory failure with hypoxia: Secondary | ICD-10-CM | POA: Diagnosis not present

## 2022-06-12 DIAGNOSIS — K219 Gastro-esophageal reflux disease without esophagitis: Secondary | ICD-10-CM | POA: Diagnosis not present

## 2022-06-12 DIAGNOSIS — I10 Essential (primary) hypertension: Secondary | ICD-10-CM | POA: Diagnosis not present

## 2022-06-12 DIAGNOSIS — C50912 Malignant neoplasm of unspecified site of left female breast: Secondary | ICD-10-CM | POA: Diagnosis not present

## 2022-06-12 DIAGNOSIS — N186 End stage renal disease: Secondary | ICD-10-CM | POA: Diagnosis not present

## 2022-06-12 DIAGNOSIS — E118 Type 2 diabetes mellitus with unspecified complications: Secondary | ICD-10-CM | POA: Diagnosis not present

## 2022-06-23 DIAGNOSIS — N186 End stage renal disease: Secondary | ICD-10-CM | POA: Diagnosis not present

## 2022-06-23 DIAGNOSIS — K219 Gastro-esophageal reflux disease without esophagitis: Secondary | ICD-10-CM | POA: Diagnosis not present

## 2022-06-23 DIAGNOSIS — E118 Type 2 diabetes mellitus with unspecified complications: Secondary | ICD-10-CM | POA: Diagnosis not present

## 2022-06-23 DIAGNOSIS — C50912 Malignant neoplasm of unspecified site of left female breast: Secondary | ICD-10-CM | POA: Diagnosis not present

## 2022-06-23 DIAGNOSIS — J9601 Acute respiratory failure with hypoxia: Secondary | ICD-10-CM | POA: Diagnosis not present

## 2022-06-23 DIAGNOSIS — I10 Essential (primary) hypertension: Secondary | ICD-10-CM | POA: Diagnosis not present

## 2022-06-27 DIAGNOSIS — K219 Gastro-esophageal reflux disease without esophagitis: Secondary | ICD-10-CM | POA: Diagnosis not present

## 2022-06-27 DIAGNOSIS — I10 Essential (primary) hypertension: Secondary | ICD-10-CM | POA: Diagnosis not present

## 2022-06-27 DIAGNOSIS — N186 End stage renal disease: Secondary | ICD-10-CM | POA: Diagnosis not present

## 2022-06-27 DIAGNOSIS — E118 Type 2 diabetes mellitus with unspecified complications: Secondary | ICD-10-CM | POA: Diagnosis not present

## 2022-06-27 DIAGNOSIS — J9601 Acute respiratory failure with hypoxia: Secondary | ICD-10-CM | POA: Diagnosis not present

## 2022-06-27 DIAGNOSIS — C50912 Malignant neoplasm of unspecified site of left female breast: Secondary | ICD-10-CM | POA: Diagnosis not present

## 2022-07-05 ENCOUNTER — Encounter: Payer: Self-pay | Admitting: Family Medicine

## 2022-07-07 ENCOUNTER — Encounter (INDEPENDENT_AMBULATORY_CARE_PROVIDER_SITE_OTHER): Payer: Self-pay | Admitting: Vascular Surgery

## 2022-07-07 ENCOUNTER — Ambulatory Visit (INDEPENDENT_AMBULATORY_CARE_PROVIDER_SITE_OTHER): Payer: Medicare Other | Admitting: Vascular Surgery

## 2022-07-07 VITALS — BP 142/75 | HR 89 | Resp 16

## 2022-07-07 DIAGNOSIS — E1122 Type 2 diabetes mellitus with diabetic chronic kidney disease: Secondary | ICD-10-CM

## 2022-07-07 DIAGNOSIS — E11311 Type 2 diabetes mellitus with unspecified diabetic retinopathy with macular edema: Secondary | ICD-10-CM

## 2022-07-07 DIAGNOSIS — N185 Chronic kidney disease, stage 5: Secondary | ICD-10-CM | POA: Diagnosis not present

## 2022-07-07 DIAGNOSIS — I10 Essential (primary) hypertension: Secondary | ICD-10-CM | POA: Diagnosis not present

## 2022-07-07 NOTE — Progress Notes (Signed)
MRN : [id number]  Alexis Tucker is a 82 y.o. (10-01-1940) female who presents with chief complaint of  Chief Complaint  Patient presents with   Follow-up    [facility] 3-4 week follow up  .  History of Present Illness: Patient returns today in follow up of her right arm steal syndrome and hematoma at the antecubital area.  Both are markedly improved after performing coil embolization to her AV graft.  Her hand feels much better.  She denies any ulceration or infection.  The pain significantly improved.  The hematoma at the antecubital fossa which was actually more of a seroma, has markedly improved after graft ligation as well.  Current Outpatient Medications  Medication Sig Dispense Refill   allopurinol (ZYLOPRIM) 100 MG tablet Take 1 tablet (100 mg total) by mouth daily. 90 tablet 3   amLODipine (NORVASC) 5 MG tablet Take 1 tablet (5 mg total) by mouth daily. 90 tablet 3   apixaban (ELIQUIS) 5 MG TABS tablet Take 1 tablet (5 mg total) by mouth 2 (two) times daily. 60 tablet 6   Blood Glucose Monitoring Suppl (ACURA BLOOD GLUCOSE METER) W/DEVICE KIT Use as directed to check sugars daily 250.42 1 kit 0   carvedilol (COREG) 25 MG tablet Take 0.5 tablets (12.5 mg total) by mouth 2 (two) times daily with a meal. 30 tablet 2   Cholecalciferol (VITAMIN D-3) 5000 UNITS TABS Take 1 tablet by mouth daily.     isosorbide dinitrate (ISORDIL) 20 MG tablet Take 1 tablet (20 mg total) by mouth daily. 90 tablet 3   levothyroxine (SYNTHROID) 88 MCG tablet TAKE 1 TABLET BY MOUTH EVERY DAY 90 tablet 3   ondansetron (ZOFRAN) 4 MG tablet Take 1 tablet (4 mg total) by mouth every 6 (six) hours as needed for nausea. 20 tablet 0   pantoprazole (PROTONIX) 40 MG tablet Take 1 tablet (40 mg total) by mouth daily as needed. (Patient taking differently: Take 40 mg by mouth at bedtime.) 90 tablet 1   pentoxifylline (TRENTAL) 400 MG CR tablet Take 1 tablet (400 mg total) by mouth daily. 90 tablet 3   senna-docusate  (SENOKOT-S) 8.6-50 MG tablet Take 1 tablet by mouth at bedtime as needed for mild constipation. 30 tablet 0   anastrozole (ARIMIDEX) 1 MG tablet Take 1 tablet (1 mg total) by mouth daily. (Patient not taking: Reported on 05/18/2022) 90 tablet 1   CALCIUM-MAGNESIUM PO Take 1 tablet by mouth daily. (Patient not taking: Reported on 06/08/2022)     torsemide (DEMADEX) 20 MG tablet Take 3 tablets (60 mg total) by mouth daily. (Patient not taking: Reported on 07/07/2022) 270 tablet 0   No current facility-administered medications for this visit.    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   Coronary artery calcification seen on CT scan    CVA (cerebral vascular accident) ([place])    a.) remotes infarct(s) noted on CT imaging from 08/17/2017 --> RIGHT parietotemporal infarct; lacunar infarct LEFT pons   Diabetes type 2, controlled ([place]) 2000s   Diabetic retinopathy with macular edema ([place]) 03/2014   a.) Avastin injections as  [place]   DJD (degenerative joint disease)    ESRD (end stage renal disease) ([place])    GERD (gastroesophageal reflux disease)    Glaucoma 03/2014   History of colon cancer 2006   a.) s/p colectomy   History of hepatitis A 1990s   from food, resolved   HTN (hypertension)    Hyperplastic colon polyp    Long term current use of anticoagulant    a.) apixaban   Long term current use of antithrombotics/antiplatelets    a.) pentoxifylline   Macular degeneration    wet R with vision loss, dry L; to see retina specialist   Neuropathy    OA (osteoarthritis)    Osteonecrosis (HCC)    hips?   PONV (postoperative nausea and vomiting)    Post-surgical hypothyroidism    Thrombocytopenia (HCC)    Tubular adenoma    Wears dentures    partial upper    Past Surgical History:  Procedure Laterality Date   A/V FISTULAGRAM Right 06/08/2022   Procedure: A/V Fistulagram;  Surgeon: Algernon Huxley, MD;  Location: [facility];  Service: Cardiovascular;  Laterality: Right;   ABDOMINAL HYSTERECTOMY  1976   partial, after tubal pregnancy, h/o fibroids with heavy bleeding   AV FISTULA PLACEMENT Right 02/01/2022   Procedure: INSERTION OF ARTERIOVENOUS (AV) GORE-TEX GRAFT ARM (LEFT UPPER EXTREMITY);  Surgeon: Algernon Huxley, MD;  Location: [facility];  Service: Vascular;  Laterality: Right;   BREAST BIOPSY Left 2008   positive   BREAST BIOPSY Left 11/22/2017   2oclock 2cmfn wing shaped clip, INVASIVE MAMMARY CARCINOMA   BREAST BIOPSY Left 11/22/2017   2oclock 4cmfn coil shaped clip, INVASIVE MAMMARY CARCINOMA   BREAST BIOPSY Left 11/22/2017   lymph node - butterfly hydroMARK   BREAST EXCISIONAL BIOPSY Left 01/07/2018   lumpectomy by Dr. Bary Castilla   BREAST LUMPECTOMY Left 2008   F/U radiation   BREAST LUMPECTOMY Left 2019   2 areas of [facility] at 2:00 position f/u with mammosite   BREAST LUMPECTOMY WITH SENTINEL LYMPH NODE BIOPSY Left 01/07/2018   Procedure: BREAST LUMPECTOMY WITH SENTINEL  LYMPH NODE BX;  Surgeon: Robert Bellow, MD;  Location: [facility];  Service: General;  Laterality: Left;   BREAST MAMMOSITE  2008   carotid ultrasound  12/2012   1-39% bilateral stenosis, severe in ECA   CATARACT EXTRACTION W/PHACO Left 08/14/2016   Procedure: CATARACT EXTRACTION PHACO AND INTRAOCULAR LENS PLACEMENT ([place])  Left Diabetic;  Surgeon: Ronnell Freshwater, MD;  Location: [place];  Service: Ophthalmology;  Laterality: Left;  Diabetic - oral meds   CHOLECYSTECTOMY  2005   COLECTOMY  2005   colon cancer   COLON SURGERY  2006   bowel obstruction   COLONOSCOPY  2008   ext hem, ileo-colonic anastomosis in cecum, tubular adenoma x1, rec rpt 1 yr for multiple  polyps (in [place])   COLONOSCOPY WITH PROPOFOL N/A 10/19/2021   2.5cm TVA with low grade dysplasia, diverticulosis, no rpt recommended Vicente Males, Bailey Mech, MD)   ESOPHAGOGASTRODUODENOSCOPY N/A 05/06/2016   Procedure: ESOPHAGOGASTRODUODENOSCOPY (EGD);  Surgeon: Manya Silvas, MD;  Location: [facility];  Service: Endoscopy;  Laterality: N/A;   EYE SURGERY     JOINT REPLACEMENT     THYROIDECTOMY, PARTIAL     unclear why   TOTAL KNEE ARTHROPLASTY Right 1999   UPPER EXTREMITY ANGIOGRAPHY Right 05/18/2022   Procedure: Upper Extremity Angiography;  Surgeon: Algernon Huxley, MD;  Location: [place];  Service: Cardiovascular;  Laterality: Right;   US ECHOCARDIOGRAPHY  11/2007   nl LV fxn EF 46%, diastolic dysfunction, LVH, tr TR and MR, slcerotic aortic valve   VEIN LIGATION AND STRIPPING       Social History   Tobacco Use   Smoking status: Former   Smokeless tobacco: Never   Tobacco comments:    quit over 40 yrs ago  Vaping Use   Vaping Use: Never used  Substance Use Topics   Alcohol use: Never    Comment: rare   Drug use: Never       Family History  Problem Relation Age of Onset   Diabetes Mother    Hypertension Mother    Arthritis Mother    Alcohol abuse Father    Arthritis Father     Stroke Daughter    CAD Neg Hx    Cancer Neg Hx    Breast cancer Neg Hx      Allergies  Allergen Reactions   Tramadol Other (See Comments)    nightmares   Codeine Anxiety   Sulfa Antibiotics Itching and Rash     REVIEW OF SYSTEMS (Negative unless checked)  Constitutional: _0 Weight loss  _1 Fever  _2 Chills Cardiac: _3 Chest pain   _4 Chest pressure   _5 Palpitations   _6 Shortness of breath when laying flat   _7 Shortness of breath at rest   _8 Shortness of breath with exertion. Vascular:  _9 Pain in legs with walking   _10 Pain in legs at rest   _11 Pain in legs when laying flat   _12 Claudication   _13 Pain in feet when walking  _14 Pain in feet at rest  _15 Pain in feet when laying flat   _16 History of DVT   _17 Phlebitis   _18 Swelling in legs   _19 Varicose veins   _20 Non-healing ulcers Pulmonary:   _21 Uses home oxygen   _22 Productive cough   _23 Hemoptysis   _24 Wheeze  _25 COPD   _26 Asthma Neurologic:  _27 Dizziness  _28 Blackouts   _29 Seizures   _30 History of stroke   _31 History of TIA  _32 Aphasia   _33 Temporary blindness   _34 Dysphagia   _35 Weakness or numbness in arms   _36 Weakness or numbness in legs Musculoskeletal:  _37 Arthritis   _38 Joint swelling   _39 Joint pain   _40 Low back pain Hematologic:  _41 Easy bruising  _42 Easy bleeding   _43 Hypercoagulable state   _44 Anemic   Gastrointestinal:  _45 Blood in stool   _46 Vomiting blood  _47 Gastroesophageal reflux/heartburn   _48 Abdominal pain Genitourinary:  _49 Chronic kidney disease   _50 Difficult urination  _51 Frequent urination  _52 Burning with urination   _53 Hematuria Skin:  _54 Rashes   _55 Ulcers   _56 Wounds Psychological:  _57 History of anxiety   _58  History of major depression.  Physical Examination  BP (!) 142/75 (BP Location: Left Arm)   Pulse 89   Resp 16  Gen:  WD/WN, NAD Head: /AT, No temporalis wasting. Ear/Nose/Throat: Hearing grossly intact, nares w/o erythema or drainage  Eyes: Conjunctiva clear. Sclera non-icteric Neck: Supple.  Trachea midline Pulmonary:  Good air  movement, no use of accessory muscles.  Cardiac: RRR, no JVD Vascular: Right arm antecubital swelling is markedly improved with a small fullness in the area now.  The right hand is warm with good capillary refill.  No thrill present within the AV graft.  Gastrointestinal: soft, non-tender/non-distended. No guarding/reflex.  Musculoskeletal: M/S 5/5 throughout.  No deformity or atrophy. Mild LE edema. Neurologic: Sensation grossly intact in extremities.  Symmetrical.  Speech is fluent.  Psychiatric: Judgment intact, Mood & affect appropriate for pt's clinical situation. Dermatologic: No rashes or ulcers noted.  No cellulitis or open wounds.      Labs Recent Results (from the past 2160 hour(s))  Brain natriuretic peptide     Status: Abnormal   Collection Time: 04/22/22 12:01 PM  Result Value Ref Range   B Natriuretic Peptide 690.4 (H) 0.0 - 100.0 pg/mL    Comment: Performed at [facility], [address], [place], [place] [address]  Basic metabolic panel     Status: Abnormal   Collection Time: 04/22/22 12:01 PM  Result Value Ref Range   Sodium 141 135 - 145 mmol/L   Potassium 4.1 3.5 - 5.1 mmol/L   Chloride 108 98 - 111 mmol/L   CO2 22 22 - 32 mmol/L   Glucose, Bld 149 (H) 70 - 99 mg/dL    Comment: Glucose reference range applies only to samples taken after fasting for at least 8 hours.   BUN 36 (H) 8 - 23 mg/dL   Creatinine, Ser 5.03 (H) 0.44 - 1.00 mg/dL   Calcium 9.3 8.9 - 10.3 mg/dL   GFR, Estimated 8 (L) >60 mL/min    Comment: (NOTE) Calculated using the CKD-EPI Creatinine Equation (2021)    Anion gap 11 5 - 15    Comment: Performed at [facility], [place], [place] [address]  Troponin I (High Sensitivity)     Status: Abnormal   Collection Time: 04/22/22 12:01 PM  Result Value Ref Range   Troponin I (High Sensitivity) 54 (H) <18 ng/L    Comment: (NOTE) Elevated high sensitivity troponin I (hsTnI) values and significant  changes  across serial measurements may suggest ACS but many other  chronic and acute conditions are known to elevate hsTnI results.  Refer to the "Links" section for chest pain algorithms and additional  guidance. Performed at [facility], [address] [place], [place] [address]   CBC with Differential     Status: Abnormal   Collection Time: 04/22/22 12:01 PM  Result Value Ref Range   WBC 9.0 4.0 - 10.5 K/uL   RBC 3.02 (L) 3.87 - 5.11 MIL/uL   Hemoglobin 8.7 (L) 12.0 - 15.0 g/dL   HCT 29.2 (L) 36.0 - 46.0 %   MCV 96.7 80.0 - 100.0 fL   MCH 28.8 26.0 - 34.0 pg   MCHC 29.8 (L) 30.0 - 36.0 g/dL   RDW 14.5 11.5 - 15.5 %   Platelets 112 (L) 150 - 400 K/uL   nRBC 0.0 0.0 - 0.2 %   Neutrophils Relative % 77 %   Neutro Abs 7.0 1.7 - 7.7 K/uL   Lymphocytes Relative 10 %   Lymphs Abs 0.9 0.7 - 4.0 K/uL   Monocytes Relative 11 %   Monocytes Absolute 1.0 0.1 - 1.0 K/uL   Eosinophils Relative 0 %   Eosinophils Absolute 0.0 0.0 - 0.5 K/uL   Basophils  Relative 1 %   Basophils Absolute 0.1 0.0 - 0.1 K/uL   Immature Granulocytes 1 %   Abs Immature Granulocytes 0.05 0.00 - 0.07 K/uL    Comment: Performed at [facility], [place], [place] [address]  Troponin I (High Sensitivity)     Status: Abnormal   Collection Time: 04/22/22  2:59 PM  Result Value Ref Range   Troponin I (High Sensitivity) 58 (H) <18 ng/L    Comment: (NOTE) Elevated high sensitivity troponin I (hsTnI) values and significant  changes across serial measurements may suggest ACS but many other  chronic and acute conditions are known to elevate hsTnI results.  Refer to the "Links" section for chest pain algorithms and additional  guidance. Performed at [facility], [address] [place], [place] [address]   SARS Coronavirus 2 by RT PCR (hospital order, performed in [facility] hospital lab) *cepheid single result test* Anterior Nasal Swab     Status: None   Collection Time:  04/22/22  6:39 PM   Specimen: Anterior Nasal Swab  Result Value Ref Range   SARS Coronavirus 2 by RT PCR NEGATIVE NEGATIVE    Comment: (NOTE) SARS-CoV-2 target nucleic acids are NOT DETECTED.  The SARS-CoV-2 RNA is generally detectable in upper and lower respiratory specimens during the acute phase of infection. The lowest concentration of SARS-CoV-2 viral copies this assay can detect is 250 copies / mL. A negative result does not preclude SARS-CoV-2 infection and should not be used as the sole basis for treatment or other patient management decisions.  A negative result may occur with improper specimen collection / handling, submission of specimen other than nasopharyngeal swab, presence of viral mutation(s) within the areas targeted by this assay, and inadequate number of viral copies (<250 copies / mL). A negative result must be combined with clinical observations, patient history, and epidemiological information.  Fact Sheet for Patients:   https://www.patel.info/  Fact Sheet for Healthcare Providers: https://hall.com/  This test is not yet approved or  cleared by the [place] FDA and has been authorized for detection and/or diagnosis of SARS-CoV-2 by FDA under an Emergency Use Authorization (EUA).  This EUA will remain in effect (meaning this test can be used) for the duration of the COVID-19 declaration under Section 564(b)(1) of the Act, 21 U.S.C. section 360bbb-3(b)(1), unless the authorization is terminated or revoked sooner.  Performed at [facility], [address] [place], [place] [address]   CBG monitoring, ED     Status: Abnormal   Collection Time: 04/22/22 10:37 PM  Result Value Ref Range   Glucose-Capillary 151 (H) 70 - 99 mg/dL    Comment: Glucose reference range applies only to samples taken after fasting for at least 8 hours.  Basic metabolic panel     Status: Abnormal   Collection Time: 04/23/22   6:12 AM  Result Value Ref Range   Sodium 142 135 - 145 mmol/L   Potassium 4.2 3.5 - 5.1 mmol/L   Chloride 110 98 - 111 mmol/L   CO2 24 22 - 32 mmol/L   Glucose, Bld 149 (H) 70 - 99 mg/dL    Comment: Glucose reference range applies only to samples taken after fasting for at least 8 hours.   BUN 41 (H) 8 - 23 mg/dL   Creatinine, Ser 5.22 (H) 0.44 - 1.00 mg/dL   Calcium 9.2 8.9 - 10.3 mg/dL   GFR, Estimated 8 (L) >60 mL/min    Comment: (NOTE) Calculated  using the CKD-EPI Creatinine Equation (2021)    Anion gap 8 5 - 15    Comment: Performed at [facility], [address] [place], [place] [address]  CBC     Status: Abnormal   Collection Time: 04/23/22  6:12 AM  Result Value Ref Range   WBC 9.2 4.0 - 10.5 K/uL   RBC 2.57 (L) 3.87 - 5.11 MIL/uL   Hemoglobin 7.5 (L) 12.0 - 15.0 g/dL   HCT 24.4 (L) 36.0 - 46.0 %   MCV 94.9 80.0 - 100.0 fL   MCH 29.2 26.0 - 34.0 pg   MCHC 30.7 30.0 - 36.0 g/dL   RDW 14.5 11.5 - 15.5 %   Platelets 94 (L) 150 - 400 K/uL    Comment: Immature Platelet Fraction may be clinically indicated, consider ordering this additional test [id number] PLATELET COUNT CONFIRMED BY SMEAR    nRBC 0.0 0.0 - 0.2 %    Comment: Performed at [facility], [address] [place], [place] [address]  Procalcitonin - Baseline     Status: None   Collection Time: 04/23/22  6:12 AM  Result Value Ref Range   Procalcitonin 5.28 ng/mL    Comment:        Interpretation: PCT > 2 ng/mL: Systemic infection (sepsis) is likely, unless other causes are known. (NOTE)       Sepsis PCT Algorithm           Lower Respiratory Tract                                      Infection PCT Algorithm    ----------------------------     ----------------------------         PCT < 0.25 ng/mL                PCT < 0.10 ng/mL          Strongly encourage             Strongly discourage   discontinuation of antibiotics    initiation of antibiotics    ----------------------------      -----------------------------       PCT 0.25 - 0.50 ng/mL            PCT 0.10 - 0.25 ng/mL               OR       >80% decrease in PCT            Discourage initiation of                                            antibiotics      Encourage discontinuation           of antibiotics    ----------------------------     -----------------------------         PCT >= 0.50 ng/mL              PCT 0.26 - 0.50 ng/mL               AND       <80% decrease in PCT              Encourage initiation of  antibiotics       Encourage continuation           of antibiotics    ----------------------------     -----------------------------        PCT >= 0.50 ng/mL                  PCT > 0.50 ng/mL               AND         increase in PCT                  Strongly encourage                                      initiation of antibiotics    Strongly encourage escalation           of antibiotics                                     -----------------------------                                           PCT <= 0.25 ng/mL                                                 OR                                        > 80% decrease in PCT                                      Discontinue / Do not initiate                                             antibiotics  Performed at [facility], [address] [place], [place] [address]   Hemoglobin A1c     Status: Abnormal   Collection Time: 04/23/22  6:12 AM  Result Value Ref Range   Hgb A1c MFr Bld 5.8 (H) 4.8 - 5.6 %    Comment: (NOTE) Pre diabetes:          5.7%-6.4%  Diabetes:              >6.4%  Glycemic control for   <7.0% adults with diabetes    Mean Plasma Glucose 119.76 mg/dL    Comment: Performed at [facility] [address], [place], [place] [address]  CBG monitoring, ED     Status: Abnormal   Collection Time: 04/23/22  7:48 AM  Result Value Ref Range   Glucose-Capillary 124 (H) 70 - 99  mg/dL    Comment: Glucose reference range applies only to samples taken after fasting for at least 8 hours.   Comment 1  Notify RN   CBG monitoring, ED     Status: Abnormal   Collection Time: 04/23/22 11:39 AM  Result Value Ref Range   Glucose-Capillary 131 (H) 70 - 99 mg/dL    Comment: Glucose reference range applies only to samples taken after fasting for at least 8 hours.   Comment 1 Notify RN   CBG monitoring, ED     Status: Abnormal   Collection Time: 04/23/22  4:40 PM  Result Value Ref Range   Glucose-Capillary 131 (H) 70 - 99 mg/dL    Comment: Glucose reference range applies only to samples taken after fasting for at least 8 hours.  CBG monitoring, ED     Status: Abnormal   Collection Time: 04/23/22  9:41 PM  Result Value Ref Range   Glucose-Capillary 116 (H) 70 - 99 mg/dL    Comment: Glucose reference range applies only to samples taken after fasting for at least 8 hours.  CBG monitoring, ED     Status: None   Collection Time: 04/24/22  8:09 AM  Result Value Ref Range   Glucose-Capillary 87 70 - 99 mg/dL    Comment: Glucose reference range applies only to samples taken after fasting for at least 8 hours.  Procalcitonin     Status: None   Collection Time: 04/24/22  9:00 AM  Result Value Ref Range   Procalcitonin 7.92 ng/mL    Comment:        Interpretation: PCT > 2 ng/mL: Systemic infection (sepsis) is likely, unless other causes are known. (NOTE)       Sepsis PCT Algorithm           Lower Respiratory Tract                                      Infection PCT Algorithm    ----------------------------     ----------------------------         PCT < 0.25 ng/mL                PCT < 0.10 ng/mL          Strongly encourage             Strongly discourage   discontinuation of antibiotics    initiation of antibiotics    ----------------------------     -----------------------------       PCT 0.25 - 0.50 ng/mL            PCT 0.10 - 0.25 ng/mL               OR       >80% decrease  in PCT            Discourage initiation of                                            antibiotics      Encourage discontinuation           of antibiotics    ----------------------------     -----------------------------         PCT >= 0.50 ng/mL              PCT 0.26 - 0.50 ng/mL               AND       <  80% decrease in PCT              Encourage initiation of                                             antibiotics       Encourage continuation           of antibiotics    ----------------------------     -----------------------------        PCT >= 0.50 ng/mL                  PCT > 0.50 ng/mL               AND         increase in PCT                  Strongly encourage                                      initiation of antibiotics    Strongly encourage escalation           of antibiotics                                     -----------------------------                                           PCT <= 0.25 ng/mL                                                 OR                                        > 80% decrease in PCT                                      Discontinue / Do not initiate                                             antibiotics  Performed at [facility], [address] [place], [place] [address]   CBC     Status: Abnormal   Collection Time: 04/24/22  9:00 AM  Result Value Ref Range   WBC 6.0 4.0 - 10.5 K/uL   RBC 2.69 (L) 3.87 - 5.11 MIL/uL   Hemoglobin 8.0 (L) 12.0 - 15.0 g/dL   HCT 25.1 (L) 36.0 - 46.0 %   MCV 93.3 80.0 - 100.0 fL   MCH 29.7 26.0 - 34.0 pg   MCHC 31.9 30.0 - 36.0 g/dL   RDW 14.5 11.5 - 15.5 %   Platelets 100 (L) 150 -  400 K/uL   nRBC 0.0 0.0 - 0.2 %    Comment: Performed at [facility], [address] [place], [place] [address]  Basic metabolic panel     Status: Abnormal   Collection Time: 04/24/22  9:00 AM  Result Value Ref Range   Sodium 141 135 - 145 mmol/L   Potassium 3.7 3.5 - 5.1 mmol/L   Chloride 109 98 -  111 mmol/L   CO2 23 22 - 32 mmol/L   Glucose, Bld 124 (H) 70 - 99 mg/dL    Comment: Glucose reference range applies only to samples taken after fasting for at least 8 hours.   BUN 47 (H) 8 - 23 mg/dL   Creatinine, Ser 5.64 (H) 0.44 - 1.00 mg/dL   Calcium 9.1 8.9 - 10.3 mg/dL   GFR, Estimated 7 (L) >60 mL/min    Comment: (NOTE) Calculated using the CKD-EPI Creatinine Equation (2021)    Anion gap 9 5 - 15    Comment: Performed at [facility], [address] [place], [place] [address]  Glucose, capillary     Status: Abnormal   Collection Time: 04/24/22 12:09 PM  Result Value Ref Range   Glucose-Capillary 134 (H) 70 - 99 mg/dL    Comment: Glucose reference range applies only to samples taken after fasting for at least 8 hours.  Glucose, capillary     Status: Abnormal   Collection Time: 04/24/22  4:17 PM  Result Value Ref Range   Glucose-Capillary 119 (H) 70 - 99 mg/dL    Comment: Glucose reference range applies only to samples taken after fasting for at least 8 hours.  Glucose, capillary     Status: Abnormal   Collection Time: 04/24/22  9:09 PM  Result Value Ref Range   Glucose-Capillary 147 (H) 70 - 99 mg/dL    Comment: Glucose reference range applies only to samples taken after fasting for at least 8 hours.  Basic metabolic panel     Status: Abnormal   Collection Time: 04/25/22  4:45 AM  Result Value Ref Range   Sodium 142 135 - 145 mmol/L   Potassium 3.5 3.5 - 5.1 mmol/L   Chloride 108 98 - 111 mmol/L   CO2 25 22 - 32 mmol/L   Glucose, Bld 100 (H) 70 - 99 mg/dL    Comment: Glucose reference range applies only to samples taken after fasting for at least 8 hours.   BUN 45 (H) 8 - 23 mg/dL   Creatinine, Ser 5.60 (H) 0.44 - 1.00 mg/dL   Calcium 8.8 (L) 8.9 - 10.3 mg/dL   GFR, Estimated 7 (L) >60 mL/min    Comment: (NOTE) Calculated using the CKD-EPI Creatinine Equation (2021)    Anion gap 9 5 - 15    Comment: Performed at [facility], [address] [place], [place] [address]  Glucose, capillary     Status: None   Collection Time: 04/25/22  7:23 AM  Result Value Ref Range   Glucose-Capillary 98 70 - 99 mg/dL    Comment: Glucose reference range applies only to samples taken after fasting for at least 8 hours.  Glucose, capillary     Status: Abnormal   Collection Time: 04/25/22 11:35 AM  Result Value Ref Range   Glucose-Capillary 135 (H) 70 - 99 mg/dL    Comment: Glucose reference range applies only to samples taken after fasting for at least 8 hours.  Potassium [facility] vascular lab only)     Status: None  Collection Time: 05/18/22  8:26 AM  Result Value Ref Range   Potassium [facility] vascular lab) 4.6 3.5 - 5.1 mmol/L    Comment: Performed at [facility], [address] [place], [place] [address]  Potassium [facility] vascular lab only)     Status: None   Collection Time: 06/08/22  9:16 AM  Result Value Ref Range   Potassium [facility] vascular lab) 4 3.5 - 5.1 mmol/L    Comment: Performed at [facility], [address] [place], [place] [address]  Glucose, capillary     Status: Abnormal   Collection Time: 06/08/22  9:21 AM  Result Value Ref Range   Glucose-Capillary 108 (H) 70 - 99 mg/dL    Comment: Glucose reference range applies only to samples taken after fasting for at least 8 hours.    Radiology PERIPHERAL VASCULAR CATHETERIZATION  Result Date: 06/08/2022 See surgical note for result.   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  Steal syndrome has resolved.  Hematoma/seroma is better.  At this point, she has not yet started dialysis and she is going to meet with her nephrologist in the upcoming weeks to discuss whether or not they are going to start.  If she dies, catheter can be placed.  HTN (hypertension) An underlying cause of her renal failure and blood pressure control important in reducing the progression of atherosclerotic disease. On  appropriate oral medications.   Diabetic retinopathy with macular edema (HCC) An underlying cause of her renal failure and blood glucose control important in reducing the progression of atherosclerotic disease. Also, involved in wound healing. On appropriate medications.    Leotis Pain, MD  07/07/2022 11:21 AM    This note was created with Dragon medical transcription system.  Any errors from dictation are purely unintentional

## 2022-07-07 NOTE — Assessment & Plan Note (Signed)
An underlying cause of her renal failure and blood glucose control important in reducing the progression of atherosclerotic disease. Also, involved in wound healing. On appropriate medications.

## 2022-07-07 NOTE — Assessment & Plan Note (Signed)
Arm is much better after graft occlusion.  Steal syndrome has resolved.  Hematoma/seroma is better.  At this point, she has not yet started dialysis and she is going to meet with her nephrologist in the upcoming weeks to discuss whether or not they are going to start.  If she dies, catheter can be placed.

## 2022-07-07 NOTE — Assessment & Plan Note (Signed)
An underlying cause of her renal failure and blood pressure control important in reducing the progression of atherosclerotic disease. On appropriate oral medications.

## 2022-07-09 ENCOUNTER — Encounter: Payer: Self-pay | Admitting: Family Medicine

## 2022-07-10 MED ORDER — LINAGLIPTIN 5 MG PO TABS: 5.0000 mg | ORAL_TABLET | Freq: Every day | ORAL | 11 refills | Status: DC

## 2022-07-25 DIAGNOSIS — E1122 Type 2 diabetes mellitus with diabetic chronic kidney disease: Secondary | ICD-10-CM | POA: Diagnosis not present

## 2022-07-25 DIAGNOSIS — N185 Chronic kidney disease, stage 5: Secondary | ICD-10-CM | POA: Diagnosis not present

## 2022-07-25 DIAGNOSIS — I129 Hypertensive chronic kidney disease with stage 1 through stage 4 chronic kidney disease, or unspecified chronic kidney disease: Secondary | ICD-10-CM | POA: Diagnosis not present

## 2022-07-25 DIAGNOSIS — D631 Anemia in chronic kidney disease: Secondary | ICD-10-CM | POA: Diagnosis not present

## 2022-07-25 DIAGNOSIS — R600 Localized edema: Secondary | ICD-10-CM | POA: Diagnosis not present

## 2022-07-25 DIAGNOSIS — I10 Essential (primary) hypertension: Secondary | ICD-10-CM | POA: Diagnosis not present

## 2022-07-25 DIAGNOSIS — N2581 Secondary hyperparathyroidism of renal origin: Secondary | ICD-10-CM | POA: Diagnosis not present

## 2022-07-26 ENCOUNTER — Other Ambulatory Visit: Payer: Self-pay | Admitting: Family Medicine

## 2022-07-26 DIAGNOSIS — E1122 Type 2 diabetes mellitus with diabetic chronic kidney disease: Secondary | ICD-10-CM | POA: Diagnosis not present

## 2022-07-26 DIAGNOSIS — R600 Localized edema: Secondary | ICD-10-CM | POA: Diagnosis not present

## 2022-07-26 DIAGNOSIS — N185 Chronic kidney disease, stage 5: Secondary | ICD-10-CM | POA: Diagnosis not present

## 2022-07-26 DIAGNOSIS — N2581 Secondary hyperparathyroidism of renal origin: Secondary | ICD-10-CM | POA: Diagnosis not present

## 2022-07-26 DIAGNOSIS — I10 Essential (primary) hypertension: Secondary | ICD-10-CM | POA: Diagnosis not present

## 2022-07-26 DIAGNOSIS — I129 Hypertensive chronic kidney disease with stage 1 through stage 4 chronic kidney disease, or unspecified chronic kidney disease: Secondary | ICD-10-CM | POA: Diagnosis not present

## 2022-07-26 DIAGNOSIS — D631 Anemia in chronic kidney disease: Secondary | ICD-10-CM | POA: Diagnosis not present

## 2022-07-26 NOTE — Telephone Encounter (Signed)
Eliquis Last filled: 06/30/22, #60 Last OV:11/25/21, hosp f/u Next OV: none

## 2022-07-31 DIAGNOSIS — R600 Localized edema: Secondary | ICD-10-CM | POA: Diagnosis not present

## 2022-07-31 DIAGNOSIS — I129 Hypertensive chronic kidney disease with stage 1 through stage 4 chronic kidney disease, or unspecified chronic kidney disease: Secondary | ICD-10-CM | POA: Diagnosis not present

## 2022-07-31 DIAGNOSIS — N2581 Secondary hyperparathyroidism of renal origin: Secondary | ICD-10-CM | POA: Diagnosis not present

## 2022-07-31 DIAGNOSIS — N185 Chronic kidney disease, stage 5: Secondary | ICD-10-CM | POA: Diagnosis not present

## 2022-07-31 DIAGNOSIS — D631 Anemia in chronic kidney disease: Secondary | ICD-10-CM | POA: Diagnosis not present

## 2022-07-31 DIAGNOSIS — I10 Essential (primary) hypertension: Secondary | ICD-10-CM | POA: Diagnosis not present

## 2022-07-31 DIAGNOSIS — E875 Hyperkalemia: Secondary | ICD-10-CM | POA: Diagnosis not present

## 2022-08-04 DIAGNOSIS — H401133 Primary open-angle glaucoma, bilateral, severe stage: Secondary | ICD-10-CM | POA: Diagnosis not present

## 2022-08-04 DIAGNOSIS — E113312 Type 2 diabetes mellitus with moderate nonproliferative diabetic retinopathy with macular edema, left eye: Secondary | ICD-10-CM | POA: Diagnosis not present

## 2022-08-04 DIAGNOSIS — H3582 Retinal ischemia: Secondary | ICD-10-CM | POA: Diagnosis not present

## 2022-08-04 DIAGNOSIS — H31093 Other chorioretinal scars, bilateral: Secondary | ICD-10-CM | POA: Diagnosis not present

## 2022-08-04 DIAGNOSIS — Z961 Presence of intraocular lens: Secondary | ICD-10-CM | POA: Diagnosis not present

## 2022-08-08 ENCOUNTER — Encounter: Payer: Self-pay | Admitting: Internal Medicine

## 2022-08-28 ENCOUNTER — Other Ambulatory Visit: Payer: Self-pay | Admitting: Family Medicine

## 2022-08-28 DIAGNOSIS — K219 Gastro-esophageal reflux disease without esophagitis: Secondary | ICD-10-CM

## 2022-08-28 NOTE — Telephone Encounter (Signed)
E-scribed refill.  Pt had MCR wellness on 12/28/21. Plz schedule CPE and lab visits for additional refills.

## 2022-08-29 NOTE — Telephone Encounter (Signed)
Lvmtcb, sent mychart message

## 2022-08-31 ENCOUNTER — Ambulatory Visit: Payer: Medicare Other | Admitting: Cardiology

## 2022-09-04 DIAGNOSIS — E113312 Type 2 diabetes mellitus with moderate nonproliferative diabetic retinopathy with macular edema, left eye: Secondary | ICD-10-CM | POA: Diagnosis not present

## 2022-09-18 ENCOUNTER — Other Ambulatory Visit: Payer: Self-pay | Admitting: Family Medicine

## 2022-09-18 DIAGNOSIS — Z1231 Encounter for screening mammogram for malignant neoplasm of breast: Secondary | ICD-10-CM

## 2022-09-23 ENCOUNTER — Other Ambulatory Visit: Payer: Self-pay | Admitting: Family Medicine

## 2022-09-23 DIAGNOSIS — E1122 Type 2 diabetes mellitus with diabetic chronic kidney disease: Secondary | ICD-10-CM

## 2022-09-23 DIAGNOSIS — E1169 Type 2 diabetes mellitus with other specified complication: Secondary | ICD-10-CM

## 2022-09-23 DIAGNOSIS — E890 Postprocedural hypothyroidism: Secondary | ICD-10-CM

## 2022-09-25 ENCOUNTER — Encounter: Payer: Self-pay | Admitting: Family Medicine

## 2022-09-25 ENCOUNTER — Ambulatory Visit (INDEPENDENT_AMBULATORY_CARE_PROVIDER_SITE_OTHER): Payer: Medicare Other | Admitting: Family Medicine

## 2022-09-25 ENCOUNTER — Telehealth: Payer: Self-pay | Admitting: *Deleted

## 2022-09-25 VITALS — BP 134/76 | HR 59 | Ht 63.0 in | Wt 168.0 lb

## 2022-09-25 DIAGNOSIS — H6123 Impacted cerumen, bilateral: Secondary | ICD-10-CM

## 2022-09-25 DIAGNOSIS — H9193 Unspecified hearing loss, bilateral: Secondary | ICD-10-CM | POA: Insufficient documentation

## 2022-09-25 DIAGNOSIS — E1122 Type 2 diabetes mellitus with diabetic chronic kidney disease: Secondary | ICD-10-CM | POA: Diagnosis not present

## 2022-09-25 DIAGNOSIS — E1169 Type 2 diabetes mellitus with other specified complication: Secondary | ICD-10-CM

## 2022-09-25 DIAGNOSIS — E890 Postprocedural hypothyroidism: Secondary | ICD-10-CM

## 2022-09-25 DIAGNOSIS — L299 Pruritus, unspecified: Secondary | ICD-10-CM | POA: Diagnosis not present

## 2022-09-25 DIAGNOSIS — N185 Chronic kidney disease, stage 5: Secondary | ICD-10-CM | POA: Diagnosis not present

## 2022-09-25 LAB — CBC WITH DIFFERENTIAL/PLATELET
Basophils Absolute: 0.1 10*3/uL (ref 0.0–0.1)
Basophils Relative: 1 % (ref 0.0–3.0)
Eosinophils Absolute: 0.2 10*3/uL (ref 0.0–0.7)
Eosinophils Relative: 2.9 % (ref 0.0–5.0)
HCT: 33.1 % — ABNORMAL LOW (ref 36.0–46.0)
Hemoglobin: 10.8 g/dL — ABNORMAL LOW (ref 12.0–15.0)
Lymphocytes Relative: 26.3 % (ref 12.0–46.0)
Lymphs Abs: 1.5 10*3/uL (ref 0.7–4.0)
MCHC: 32.6 g/dL (ref 30.0–36.0)
MCV: 93.4 fl (ref 78.0–100.0)
Monocytes Absolute: 0.6 10*3/uL (ref 0.1–1.0)
Monocytes Relative: 10.5 % (ref 3.0–12.0)
Neutro Abs: 3.4 10*3/uL (ref 1.4–7.7)
Neutrophils Relative %: 59.3 % (ref 43.0–77.0)
Platelets: 126 10*3/uL — ABNORMAL LOW (ref 150.0–400.0)
RBC: 3.55 Mil/uL — ABNORMAL LOW (ref 3.87–5.11)
RDW: 16.2 % — ABNORMAL HIGH (ref 11.5–15.5)
WBC: 5.7 10*3/uL (ref 4.0–10.5)

## 2022-09-25 LAB — LIPID PANEL
Cholesterol: 195 mg/dL (ref 0–200)
HDL: 63.1 mg/dL (ref >39.00)
LDL Cholesterol: 114 mg/dL — ABNORMAL HIGH (ref 0–99)
NonHDL: 131.68
Total CHOL/HDL Ratio: 3
Triglycerides: 90 mg/dL (ref 0.0–149.0)
VLDL: 18 mg/dL (ref 0.0–40.0)

## 2022-09-25 LAB — COMPREHENSIVE METABOLIC PANEL
ALT: 9 U/L (ref 0–35)
AST: 14 U/L (ref 0–37)
Albumin: 4.3 g/dL (ref 3.5–5.2)
Alkaline Phosphatase: 60 U/L (ref 39–117)
BUN: 37 mg/dL — ABNORMAL HIGH (ref 6–23)
CO2: 19 mEq/L (ref 19–32)
Calcium: 9.5 mg/dL (ref 8.4–10.5)
Chloride: 110 mEq/L (ref 96–112)
Creatinine, Ser: 5.56 mg/dL (ref 0.40–1.20)
GFR: 6.84 mL/min — CL (ref >60.00)
Glucose, Bld: 103 mg/dL — ABNORMAL HIGH (ref 70–99)
Potassium: 4.8 mEq/L (ref 3.5–5.1)
Sodium: 139 mEq/L (ref 135–145)
Total Bilirubin: 0.6 mg/dL (ref 0.2–1.2)
Total Protein: 7.2 g/dL (ref 6.0–8.3)

## 2022-09-25 LAB — HEMOGLOBIN A1C: Hgb A1c MFr Bld: 6.1 % (ref 4.6–6.5)

## 2022-09-25 NOTE — Patient Instructions (Signed)
Good to see you today Irrigation to ears performed today Labs today

## 2022-09-25 NOTE — Assessment & Plan Note (Signed)
Anticipate serous otitis, post-viral vs allergic with initial hoarseness that is improved - as symptoms are improving will continue to monitor.  Unable to visualize TMs

## 2022-09-25 NOTE — Assessment & Plan Note (Signed)
Cerumen impaction bilaterally in setting of decreased hearing - s/p unsuccessful irrigation performed today. Discussed further home treatment with mineral oil or OTC carbamide peroxidase. If not improving will refer to ENT.

## 2022-09-25 NOTE — Progress Notes (Signed)
Patient ID: Alexis Tucker, female    DOB: [birth year], 82 y.o.   MRN: [id number]  This visit was conducted in person.  BP 134/76   Pulse (!) 59   Ht 5\' 3"  (1.6 m)   Wt 168 lb (76.2 kg)   SpO2 99%   BMI 29.76 kg/m    CC: hearing loss Subjective:   HPI: Alexis Tucker is a 82 y.o. female presenting on 09/25/2022 for Hearing Problem (C/o hearing loss. Sxs started 09/18/22. Also, wants to have CPE labs drawn today. Pt accompanied by daughter, Santiago Glad. )   1 wk h/o muffled hearing associated with hoarse voice. Hearing and hoarse voice started getting better. + head congestion.  Occ allergy symptom during spring - watery eyes.   No fevers, ear pain, new cough, ST, PNdrainage, sneezing or itch runny nose.   Upcoming CPE scheduled for next week - requests labs done today.   Known CKD stage 5 sees Dr Candiss Norse.   Upcoming mammogram scheduled for next month - she states she's hesitant to complete due to 1 mo h/o itching under arms. Denies skin changes, mass, rash, or tenderness. She has been using new deodorant, no other new lotions, soaps/shampoo, detergent.      Relevant past medical, surgical, family and social history reviewed and updated as indicated. Interim medical history since our last visit reviewed. Allergies and medications reviewed and updated. Outpatient Medications Prior to Visit  Medication Sig Dispense Refill   allopurinol (ZYLOPRIM) 100 MG tablet Take 1 tablet (100 mg total) by mouth daily. 90 tablet 3   amLODipine (NORVASC) 5 MG tablet Take 1 tablet (5 mg total) by mouth daily. 90 tablet 3   anastrozole (ARIMIDEX) 1 MG tablet Take 1 tablet (1 mg total) by mouth daily. 90 tablet 1   Blood Glucose Monitoring Suppl (ACURA BLOOD GLUCOSE METER) W/DEVICE KIT Use as directed to check sugars daily 250.42 1 kit 0   carvedilol (COREG) 25 MG tablet Take 0.5 tablets (12.5 mg total) by mouth 2 (two) times daily with a meal. 30 tablet 2   Cholecalciferol (VITAMIN D-3) 5000 UNITS TABS Take 1  tablet by mouth daily.     ELIQUIS 5 MG TABS tablet TAKE 1 TABLET BY MOUTH TWICE A DAY 60 tablet 6   isosorbide dinitrate (ISORDIL) 20 MG tablet Take 1 tablet (20 mg total) by mouth daily. 90 tablet 3   levothyroxine (SYNTHROID) 88 MCG tablet TAKE 1 TABLET BY MOUTH EVERY DAY 90 tablet 3   linagliptin (TRADJENTA) 5 MG TABS tablet Take 1 tablet (5 mg total) by mouth daily. 30 tablet 11   pantoprazole (PROTONIX) 40 MG tablet TAKE 1 TABLET BY MOUTH DAILY AS NEEDED 90 tablet 0   pentoxifylline (TRENTAL) 400 MG CR tablet Take 1 tablet (400 mg total) by mouth daily. 90 tablet 3   CALCIUM-MAGNESIUM PO Take 1 tablet by mouth daily. (Patient not taking: Reported on 06/08/2022)     ondansetron (ZOFRAN) 4 MG tablet Take 1 tablet (4 mg total) by mouth every 6 (six) hours as needed for nausea. 20 tablet 0   senna-docusate (SENOKOT-S) 8.6-50 MG tablet Take 1 tablet by mouth at bedtime as needed for mild constipation. 30 tablet 0   torsemide (DEMADEX) 20 MG tablet Take 3 tablets (60 mg total) by mouth daily. (Patient not taking: Reported on 07/07/2022) 270 tablet 0   No facility-administered medications prior to visit.     Per HPI unless specifically indicated in ROS section below Review of Systems  Objective:  BP 134/76   Pulse (!) 59   Ht 5\' 3"  (1.6 m)   Wt 168 lb (76.2 kg)   SpO2 99%   BMI 29.76 kg/m   Wt Readings from Last 3 Encounters:  09/25/22 168 lb (76.2 kg)  06/08/22 175 lb (79.4 kg)  05/18/22 175 lb (79.4 kg)      Physical Exam Vitals and nursing note reviewed.  Constitutional:      Appearance: Normal appearance. She is not ill-appearing.  HENT:     Head: Normocephalic and atraumatic.     Right Ear: Ear canal and external ear normal. There is impacted cerumen.     Left Ear: Ear canal and external ear normal. There is impacted cerumen.     Ears:     Comments:  Unable to visualize TM  Consent obtained, debrox instilled into bilateral ears followed by irrigation, pt tolerated  procedure well however unsuccessfully     Nose: Nose normal. No congestion or rhinorrhea.     Mouth/Throat:     Mouth: Mucous membranes are moist.     Pharynx: Oropharynx is clear. No oropharyngeal exudate or posterior oropharyngeal erythema.  Eyes:     Extraocular Movements: Extraocular movements intact.     Conjunctiva/sclera: Conjunctivae normal.     Pupils: Pupils are equal, round, and reactive to light.  Cardiovascular:     Rate and Rhythm: Normal rate and regular rhythm.     Pulses: Normal pulses.     Heart sounds: Normal heart sounds. No murmur heard. Pulmonary:     Effort: Pulmonary effort is normal. No respiratory distress.     Breath sounds: Normal breath sounds. No wheezing, rhonchi or rales.  Musculoskeletal:     Right lower leg: No edema.     Left lower leg: No edema.  Lymphadenopathy:     Head:     Right side of head: No submental, submandibular, tonsillar, preauricular or posterior auricular adenopathy.     Left side of head: No submental, submandibular, tonsillar, preauricular or posterior auricular adenopathy.     Cervical: No cervical adenopathy.     Right cervical: No superficial cervical adenopathy.    Left cervical: No superficial cervical adenopathy.     Upper Body:     Right upper body: No supraclavicular adenopathy.     Left upper body: No supraclavicular adenopathy.  Skin:    General: Skin is warm and dry.     Findings: No rash.  Neurological:     Mental Status: She is alert.  Psychiatric:        Mood and Affect: Mood normal.        Behavior: Behavior normal.       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 Discussed further home treatment with mineral oil or OTC carbamide peroxidase. If not improving  will refer to ENT.       Pruritus    Suggested try different deodorant brand as symptoms may have been temporally related to new deodorant.  Encouraged she proceed with mammogram in h/o breast cancer.       Decreased hearing of both ears    Anticipate serous otitis, post-viral vs allergic with initial hoarseness that is improved - as symptoms are improving will continue to monitor.  Unable to visualize TMs        No orders of the defined types were placed in this encounter.   No orders of the defined types were placed in this encounter.   Patient Instructions  Good to see you today Irrigation to ears performed today Labs today   Follow up plan: No follow-ups on file.  Ria Bush, MD

## 2022-09-25 NOTE — Assessment & Plan Note (Addendum)
Suggested try different deodorant brand as symptoms may have been temporally related to new deodorant.  Encouraged she proceed with mammogram in h/o breast cancer.

## 2022-09-25 NOTE — Telephone Encounter (Signed)
Hope from [place] lab called with a critical lab. Creatinine 5.56 GFR 6.84 Dr. Danise Mina notified.

## 2022-09-26 ENCOUNTER — Other Ambulatory Visit: Payer: Medicare Other

## 2022-09-26 LAB — TSH: TSH: 1.82 u[IU]/mL (ref 0.35–5.50)

## 2022-09-26 NOTE — Telephone Encounter (Signed)
Noted. Known ESRD.  

## 2022-10-03 ENCOUNTER — Encounter: Payer: Self-pay | Admitting: Family Medicine

## 2022-10-03 ENCOUNTER — Ambulatory Visit (INDEPENDENT_AMBULATORY_CARE_PROVIDER_SITE_OTHER): Payer: Medicare Other | Admitting: Family Medicine

## 2022-10-03 VITALS — BP 144/78 | HR 68 | Temp 97.8°F | Ht 63.0 in | Wt 168.2 lb

## 2022-10-03 DIAGNOSIS — M810 Age-related osteoporosis without current pathological fracture: Secondary | ICD-10-CM

## 2022-10-03 DIAGNOSIS — K219 Gastro-esophageal reflux disease without esophagitis: Secondary | ICD-10-CM

## 2022-10-03 DIAGNOSIS — I10 Essential (primary) hypertension: Secondary | ICD-10-CM | POA: Diagnosis not present

## 2022-10-03 DIAGNOSIS — H3530 Unspecified macular degeneration: Secondary | ICD-10-CM | POA: Diagnosis not present

## 2022-10-03 DIAGNOSIS — E11311 Type 2 diabetes mellitus with unspecified diabetic retinopathy with macular edema: Secondary | ICD-10-CM | POA: Diagnosis not present

## 2022-10-03 DIAGNOSIS — E1122 Type 2 diabetes mellitus with diabetic chronic kidney disease: Secondary | ICD-10-CM

## 2022-10-03 DIAGNOSIS — N185 Chronic kidney disease, stage 5: Secondary | ICD-10-CM | POA: Diagnosis not present

## 2022-10-03 DIAGNOSIS — E890 Postprocedural hypothyroidism: Secondary | ICD-10-CM | POA: Diagnosis not present

## 2022-10-03 DIAGNOSIS — I480 Paroxysmal atrial fibrillation: Secondary | ICD-10-CM | POA: Diagnosis not present

## 2022-10-03 DIAGNOSIS — H6123 Impacted cerumen, bilateral: Secondary | ICD-10-CM

## 2022-10-03 DIAGNOSIS — Z85038 Personal history of other malignant neoplasm of large intestine: Secondary | ICD-10-CM | POA: Diagnosis not present

## 2022-10-03 DIAGNOSIS — Z7189 Other specified counseling: Secondary | ICD-10-CM

## 2022-10-03 DIAGNOSIS — E1169 Type 2 diabetes mellitus with other specified complication: Secondary | ICD-10-CM

## 2022-10-03 DIAGNOSIS — Z853 Personal history of malignant neoplasm of breast: Secondary | ICD-10-CM

## 2022-10-03 DIAGNOSIS — H401132 Primary open-angle glaucoma, bilateral, moderate stage: Secondary | ICD-10-CM

## 2022-10-03 DIAGNOSIS — N2581 Secondary hyperparathyroidism of renal origin: Secondary | ICD-10-CM

## 2022-10-03 DIAGNOSIS — R413 Other amnesia: Secondary | ICD-10-CM

## 2022-10-03 DIAGNOSIS — D631 Anemia in chronic kidney disease: Secondary | ICD-10-CM

## 2022-10-03 MED ORDER — PANTOPRAZOLE SODIUM 40 MG PO TBEC: 40.0000 mg | DELAYED_RELEASE_TABLET | Freq: Every day | ORAL | 4 refills | Status: DC | PRN

## 2022-10-03 MED ORDER — ISOSORBIDE DINITRATE 20 MG PO TABS: 20.0000 mg | ORAL_TABLET | Freq: Every day | ORAL | 4 refills | Status: DC

## 2022-10-03 MED ORDER — ALLOPURINOL 100 MG PO TABS: 100.0000 mg | ORAL_TABLET | Freq: Every day | ORAL | 4 refills | Status: DC

## 2022-10-03 MED ORDER — APIXABAN 5 MG PO TABS: 5.0000 mg | ORAL_TABLET | Freq: Two times a day (BID) | ORAL | 11 refills | Status: DC

## 2022-10-03 MED ORDER — PENTOXIFYLLINE ER 400 MG PO TBCR: 400.0000 mg | EXTENDED_RELEASE_TABLET | Freq: Every day | ORAL | 4 refills | Status: DC

## 2022-10-03 MED ORDER — LEVOTHYROXINE SODIUM 88 MCG PO TABS: 88.0000 ug | ORAL_TABLET | Freq: Every day | ORAL | 4 refills | Status: DC

## 2022-10-03 NOTE — Assessment & Plan Note (Signed)
Advanced directive planning: received 01/2022, scanned into chart. HCPOA is daughter Kindell Hislop. No life prolonging measures if terminal condition or advanced dementia/loss of mental function. Ok with feeding tube however. Wants health care agent followed.

## 2022-10-03 NOTE — Patient Instructions (Addendum)
We will fax recent labs attention Dr Keturah Barre office (nephrology).  Good to see you today Return as needed or in 6-9 months for next medicare wellness visit

## 2022-10-03 NOTE — Progress Notes (Signed)
Patient ID: Alexis Tucker, female    DOB: [birth year], 82 y.o.   MRN: [id number]  This visit was conducted in person.  BP (!) 144/78   Pulse 68   Temp 97.8 F (36.6 C) (Temporal)   Ht 5\' 3"  (1.6 m)   Wt 168 lb 4 oz (76.3 kg)   SpO2 100%   BMI 29.80 kg/m    CC: CPE Subjective:   HPI: Alexis Tucker is a 82 y.o. female presenting on 10/03/2022 for Annual Exam (MCR prt 2 [AWV- 12/28/21]. Pt accompanied by daughter, Alexis Tucker. )   Call daughter Alexis Tucker for all appointments [phone]   Saw health advisor 12/2021 for medicare wellness visit. Note reviewed.    Failed cognitive assessment.    12/28/2021   12:48 PM  6CIT Screen  What Year? 4 points  What month? 0 points  What time? 3 points  Count back from 20 0 points  Months in reverse 4 points  Repeat phrase 10 points  Total Score 21 points    No results found.  Flowsheet Row Office Visit from 09/25/2022 in [facility]  PHQ-2 Total Score 0          09/25/2022   12:05 PM 02/08/2022   11:30 AM 12/28/2021   12:46 PM 06/18/2020    9:07 AM 01/07/2019    1:04 PM  Fall Risk   Falls in the past year? 0 0 0 0 0  Number falls in past yr:   0    Injury with Fall?   0    Risk for fall due to :   Impaired balance/gait;Medication side effect    Follow up   Falls evaluation completed;Education provided;Falls prevention discussed       Seen here last week with hearing loss after URI - thought serous otitis that was improving. S/p unsuccessful irrigation - rec debrox OTC, declines ENT referral.   Known CKD stage V sees Dr Thedore Mins, sees 10/16/2022.  Saw VVS with AV graft to R AC complicated by steal syndrome and hematoma/seroma s/p coil embolization/occlusion.   DM - onglyza no longer covered by insurance this year, now on tradjenta 5mg  daily.   Upcoming mammogram scheduled for tomorrow.   Seeing Dr Allena Katz monthly for glaucoma and macular degeneration - getting monthly injection Alexis Tucker.   Geriatric  Assessment: Activities of Daily Living:     Bathing- independent    Dressing- independent    Eating- independent    Toileting- independent    Transferring- independent    Continence- independent Overall Assessment: independent  Instrumental Activities of Daily Living:     Transportation- dependent    Meal/Food Preparation- partially dependent    Shopping Errands- dependent     Housekeeping/Chores- dependent    Money Management/Finances- independent    Medication Management- dependent    Ability to Use Telephone- dependent    Laundry- dependent Overall Assessment: largely dependent  Mental Status Exam: 15/27 lower total score due to impaired vision  Clock Drawing Score: 2/2 - unable to indicate minute/hour hand due to impaired vision  Preventative: H/o colon cancer 2005 s/p colectomy.  COLONOSCOPY Date: 2008 ext hem, ileo-colonic anastomosis in cecum, tubular adenoma x1, rec rpt 1 yr for multiple polyps (done in [place])  COLONOSCOPY WITH PROPOFOL 10/19/2021 - 2.5cm TVA with low grade dysplasia, diverticulosis, rec rpt flex sig 1 yr to re-evaluate polypectomy site Alexis Tucker, Alexis Salina, MD)  Well woman last seen 2008 (OBGYN at [facility]), s/p hysterectomy  1976.  History of breast cancer treated 2020, now on arimidex. Latest dx mammogram 01/2020 Birads2.  DEXA 08/2017 - T -2.4 hip, -2.7 forearm; osteoporosis. Discussed weight bearing exercise, calcium in the diet and supplement as well as vitamin D 5000 IU daily. Declines repeat.  Lung cancer screen - not eligible  Flu shot yearly  COVID vaccine Pfizer 08/2019 x2, booster 06/2020 Pneumovax 2011, prevnar 2015 Tetanus unsure Shingles - discussed at pharmacy RSV - discussed  Advanced directive planning: received 01/2022, scanned into chart. HCPOA is daughter Alexis Tucker. No life prolonging measures if terminal condition or advanced dementia/loss of mental function. Ok with feeding tube however. Wants health care agent followed.  Seat belt  use discussed.  Sunscreen use discussed. No changing moles on skin.  Non smoker  Alcohol - wine rarely   Dentist - has not seen - no dental coverage  Eye exam monthly Dr Allena Katz  Bowel - no diarrhea/constipation  Bladder - no incontinence    Lives with daughter Alexis Tucker)  Granddaughter in [place] (attorney)  Divorced Occu: retired, worked in Consulting civil engineer Edu: college     Relevant past medical, surgical, family and social history reviewed and updated as indicated. Interim medical history since our last visit reviewed. Allergies and medications reviewed and updated. Outpatient Medications Prior to Visit  Medication Sig Dispense Refill   amLODipine (NORVASC) 5 MG tablet Take 1 tablet (5 mg total) by mouth daily. 90 tablet 3   anastrozole (ARIMIDEX) 1 MG tablet Take 1 tablet (1 mg total) by mouth daily. 90 tablet 1   Blood Glucose Monitoring Suppl (ACURA BLOOD GLUCOSE METER) W/DEVICE KIT Use as directed to check sugars daily 250.42 1 kit 0   carvedilol (COREG) 25 MG tablet Take 0.5 tablets (12.5 mg total) by mouth 2 (two) times daily with a meal. 30 tablet 2   Cholecalciferol (VITAMIN D-3) 5000 UNITS TABS Take 1 tablet by mouth daily.     linagliptin (TRADJENTA) 5 MG TABS tablet Take 1 tablet (5 mg total) by mouth daily. 30 tablet 11   allopurinol (ZYLOPRIM) 100 MG tablet Take 1 tablet (100 mg total) by mouth daily. 90 tablet 3   ELIQUIS 5 MG TABS tablet TAKE 1 TABLET BY MOUTH TWICE A DAY 60 tablet 6   isosorbide dinitrate (ISORDIL) 20 MG tablet Take 1 tablet (20 mg total) by mouth daily. 90 tablet 3   levothyroxine (SYNTHROID) 88 MCG tablet TAKE 1 TABLET BY MOUTH EVERY DAY 90 tablet 3   pantoprazole (PROTONIX) 40 MG tablet TAKE 1 TABLET BY MOUTH DAILY AS NEEDED 90 tablet 0   pentoxifylline (TRENTAL) 400 MG CR tablet Take 1 tablet (400 mg total) by mouth daily. 90 tablet 3   No facility-administered medications prior to visit.     Per HPI unless specifically indicated in ROS section below Review of  Systems  Objective:  BP (!) 144/78   Pulse 68   Temp 97.8 F (36.6 C) (Temporal)   Ht  (1.6 m)   Wt 168 lb 4 oz (76.3 kg)   SpO2 100%   BMI 29.80 kg/m   Wt Readings from Last 3 Encounters:  10/03/22 168 lb 4 oz (76.3 kg)  09/25/22 168 lb (76.2 kg)  06/08/22 175 lb (79.4 kg)      Physical Exam Vitals and nursing note reviewed.  Constitutional:      Appearance: Normal appearance. She is not ill-appearing.  HENT:     Head: Normocephalic and atraumatic.     Right Ear:  Tympanic membrane, ear canal and external ear normal. There is impacted cerumen.     Left Ear: Tympanic membrane, ear canal and external ear normal. There is impacted cerumen.     Mouth/Throat:     Mouth: Mucous membranes are moist.     Pharynx: Oropharynx is clear. No oropharyngeal exudate or posterior oropharyngeal erythema.  Eyes:     General:        Right eye: No discharge.        Left eye: No discharge.     Extraocular Movements: Extraocular movements intact.     Conjunctiva/sclera: Conjunctivae normal.     Pupils: Pupils are equal, round, and reactive to light.  Neck:     Thyroid: No thyroid mass or thyromegaly.  Cardiovascular:     Rate and Rhythm: Normal rate and regular rhythm.     Pulses: Normal pulses.     Heart sounds: Normal heart sounds. No murmur heard. Pulmonary:     Effort: Pulmonary effort is normal. No respiratory distress.     Breath sounds: Normal breath sounds. No wheezing, rhonchi or rales.  Abdominal:     General: Bowel sounds are normal. There is no distension.     Palpations: Abdomen is soft. There is no mass.     Tenderness: There is no abdominal tenderness. There is no guarding or rebound.     Hernia: No hernia is present.  Musculoskeletal:     Cervical back: Normal range of motion and neck supple. No rigidity.     Right lower leg: Edema (tr) present.     Left lower leg: Edema (tr) present.  Lymphadenopathy:     Cervical: No cervical adenopathy.  Skin:    General:  Skin is warm and dry.     Findings: No rash.  Neurological:     General: No focal deficit present.     Mental Status: She is alert. Mental status is at baseline.  Psychiatric:        Mood and Affect: Mood normal.        Behavior: Behavior normal.       Results for orders placed or performed in visit on 09/25/22  Lipid panel  Result Value Ref Range   Cholesterol 195 0 - 200 mg/dL   Triglycerides 16.190.0 0.0 - 149.0 mg/dL   HDL 09.6063.10 >45.40>39.00 mg/dL   VLDL 98.118.0 0.0 - 19.140.0 mg/dL   LDL Cholesterol 478114 (H) 0 - 99 mg/dL   Total CHOL/HDL Ratio 3    NonHDL 131.68   Comprehensive metabolic panel  Result Value Ref Range   Sodium 139 135 - 145 mEq/L   Potassium 4.8 3.5 - 5.1 mEq/L   Chloride 110 96 - 112 mEq/L   CO2 19 19 - 32 mEq/L   Glucose, Bld 103 (H) 70 - 99 mg/dL   BUN 37 (H) 6 - 23 mg/dL   Creatinine, Ser 2.955.56 (HH) 0.40 - 1.20 mg/dL   Total Bilirubin 0.6 0.2 - 1.2 mg/dL   Alkaline Phosphatase 60 39 - 117 U/L   AST 14 0 - 37 U/L   ALT 9 0 - 35 U/L   Total Protein 7.2 6.0 - 8.3 g/dL   Albumin 4.3 3.5 - 5.2 g/dL   GFR 6.216.84 (LL) >30.86>60.00 mL/min   Calcium 9.5 8.4 - 10.5 mg/dL  CBC with Differential/Platelet  Result Value Ref Range   WBC 5.7 4.0 - 10.5 K/uL   RBC 3.55 (L) 3.87 - 5.11 Mil/uL   Hemoglobin 10.8 (L) 12.0 -  15.0 g/dL   HCT 41.4 (L) 23.9 - 53.2 %   MCV 93.4 78.0 - 100.0 fl   MCHC 32.6 30.0 - 36.0 g/dL   RDW 02.3 (H) 34.3 - 56.8 %   Platelets 126.0 (L) 150.0 - 400.0 K/uL   Neutrophils Relative % 59.3 43.0 - 77.0 %   Lymphocytes Relative 26.3 12.0 - 46.0 %   Monocytes Relative 10.5 3.0 - 12.0 %   Eosinophils Relative 2.9 0.0 - 5.0 %   Basophils Relative 1.0 0.0 - 3.0 %   Neutro Abs 3.4 1.4 - 7.7 K/uL   Lymphs Abs 1.5 0.7 - 4.0 K/uL   Monocytes Absolute 0.6 0.1 - 1.0 K/uL   Eosinophils Absolute 0.2 0.0 - 0.7 K/uL   Basophils Absolute 0.1 0.0 - 0.1 K/uL  Hemoglobin A1c  Result Value Ref Range   Hgb A1c MFr Bld 6.1 4.6 - 6.5 %  TSH  Result Value Ref Range   TSH 1.82  0.35 - 5.50 uIU/mL    Assessment & Plan:   Problem List Items Addressed This Visit     Advanced care planning/counseling discussion - Primary (Chronic)    Advanced directive planning: received 01/2022, scanned into chart. HCPOA is daughter Michale Lawwill. No life prolonging measures if terminal condition or advanced dementia/loss of mental function. Ok with feeding tube however. Wants health care agent followed.       Post-surgical hypothyroidism    TSH stable on levothyroxine daily - continue this.       Relevant Medications   levothyroxine (SYNTHROID) 88 MCG tablet   Type 2 diabetes mellitus with other specified complication    Chronic, stable period on tradjenta 5mg  daily - replacement for onglyza due to insurance formulary change. Great control based on latest A1c.       HTN (hypertension)    Chronic, BP adequate.       Relevant Medications   isosorbide dinitrate (ISORDIL) 20 MG tablet   apixaban (ELIQUIS) 5 MG TABS tablet   History of breast cancer    Upcoming screening mammogram - overdue      History of colon cancer    Latest colonoscopy 2023 showed 2.5cm TVA - rec rpt flex sig 1 yr to re-evaluate polypectomy site       GERD (gastroesophageal reflux disease)    She continues pantoprazole 40mg  daily prn      Relevant Medications   pantoprazole (PROTONIX) 40 MG tablet   CKD stage 5 due to type 2 diabetes mellitus    Appreciate renal care - sees regularly.       Macular degeneration    Regularly followed by eye doctor      Diabetic retinopathy with macular edema   Paroxysmal atrial fibrillation with rapid ventricular response    Continues eliquis and carvedilol      Relevant Medications   isosorbide dinitrate (ISORDIL) 20 MG tablet   apixaban (ELIQUIS) 5 MG TABS tablet   Anemia due to stage 5 chronic kidney disease, not on chronic dialysis    Update CBC.      Osteoporosis    Osteoporosis by DEXA 2019 - reviewed calcium, vit D intake, staying as  active as able.  Pt declines repeat DEXA. In h/o CKD stage 5, reasonable to defer.       Secondary hyperparathyroidism of renal origin   Primary open angle glaucoma (POAG) of both eyes   Bilateral hearing loss due to cerumen impaction    Offered, declines ENT eval  Memory impairment    Independent in ADLs, mostly dependent in IADLs. MMSE 15/27 suggestive of significant memory impairment - discussed this with pt and daughter. Reviewed healthy lifestyle choices to optimize mental health - will continue to monitor.         Meds ordered this encounter  Medications   allopurinol (ZYLOPRIM) 100 MG tablet    Sig: Take 1 tablet (100 mg total) by mouth daily. For gout    Dispense:  90 tablet    Refill:  4   isosorbide dinitrate (ISORDIL) 20 MG tablet    Sig: Take 1 tablet (20 mg total) by mouth daily. For heart/blood pressure    Dispense:  90 tablet    Refill:  4   pantoprazole (PROTONIX) 40 MG tablet    Sig: Take 1 tablet (40 mg total) by mouth daily as needed. For reflux    Dispense:  90 tablet    Refill:  4   pentoxifylline (TRENTAL) 400 MG CR tablet    Sig: Take 1 tablet (400 mg total) by mouth daily. For circulation    Dispense:  90 tablet    Refill:  4   apixaban (ELIQUIS) 5 MG TABS tablet    Sig: Take 1 tablet (5 mg total) by mouth 2 (two) times daily. As blood thinner    Dispense:  60 tablet    Refill:  11   levothyroxine (SYNTHROID) 88 MCG tablet    Sig: Take 1 tablet (88 mcg total) by mouth daily. For hypothyroidism    Dispense:  90 tablet    Refill:  4    No orders of the defined types were placed in this encounter.   Patient Instructions  We will fax recent labs attention Dr Doristine Church office (nephrology).  Good to see you today Return as needed or in 6-9 months for next medicare wellness visit  Follow up plan: Return in about 9 months (around 07/05/2023) for medicare wellness visit.  Eustaquio Boyden, MD

## 2022-10-04 ENCOUNTER — Ambulatory Visit
Admission: RE | Admit: 2022-10-04 | Discharge: 2022-10-04 | Disposition: A | Discharge status: Home / Self Care | Payer: Medicare Other | Source: Ambulatory Visit | Attending: Family Medicine | Admitting: Family Medicine

## 2022-10-04 ENCOUNTER — Encounter: Payer: Self-pay | Admitting: Internal Medicine

## 2022-10-04 DIAGNOSIS — Z1231 Encounter for screening mammogram for malignant neoplasm of breast: Secondary | ICD-10-CM | POA: Insufficient documentation

## 2022-10-05 ENCOUNTER — Other Ambulatory Visit: Payer: Self-pay | Admitting: Family Medicine

## 2022-10-05 DIAGNOSIS — N6459 Other signs and symptoms in breast: Secondary | ICD-10-CM

## 2022-10-05 DIAGNOSIS — R928 Other abnormal and inconclusive findings on diagnostic imaging of breast: Secondary | ICD-10-CM

## 2022-10-05 DIAGNOSIS — N6489 Other specified disorders of breast: Secondary | ICD-10-CM

## 2022-10-06 DIAGNOSIS — E113312 Type 2 diabetes mellitus with moderate nonproliferative diabetic retinopathy with macular edema, left eye: Secondary | ICD-10-CM | POA: Diagnosis not present

## 2022-10-09 ENCOUNTER — Ambulatory Visit
Admission: RE | Admit: 2022-10-09 | Discharge: 2022-10-09 | Disposition: A | Discharge status: Home / Self Care | Payer: Medicare Other | Source: Ambulatory Visit | Attending: Family Medicine | Admitting: Family Medicine

## 2022-10-09 ENCOUNTER — Encounter: Payer: Self-pay | Admitting: Family Medicine

## 2022-10-09 DIAGNOSIS — R928 Other abnormal and inconclusive findings on diagnostic imaging of breast: Secondary | ICD-10-CM

## 2022-10-09 DIAGNOSIS — N6459 Other signs and symptoms in breast: Secondary | ICD-10-CM

## 2022-10-09 DIAGNOSIS — N6489 Other specified disorders of breast: Secondary | ICD-10-CM | POA: Diagnosis not present

## 2022-10-09 DIAGNOSIS — R413 Other amnesia: Secondary | ICD-10-CM | POA: Insufficient documentation

## 2022-10-09 NOTE — Assessment & Plan Note (Signed)
She continues pantoprazole 40mg  daily prn

## 2022-10-09 NOTE — Assessment & Plan Note (Addendum)
Chronic, stable period on tradjenta 5mg  daily - replacement for onglyza due to insurance formulary change. Great control based on latest A1c.

## 2022-10-09 NOTE — Assessment & Plan Note (Signed)
Appreciate renal care - sees regularly.

## 2022-10-09 NOTE — Assessment & Plan Note (Signed)
Offered, declines ENT eval

## 2022-10-09 NOTE — Assessment & Plan Note (Signed)
Continues eliquis and carvedilol

## 2022-10-09 NOTE — Assessment & Plan Note (Signed)
Independent in ADLs, mostly dependent in IADLs. MMSE 15/27 suggestive of significant memory impairment - discussed this with pt and daughter. Reviewed healthy lifestyle choices to optimize mental health - will continue to monitor.

## 2022-10-09 NOTE — Assessment & Plan Note (Signed)
Update CBC. 

## 2022-10-09 NOTE — Assessment & Plan Note (Signed)
Chronic, BP adequate.

## 2022-10-09 NOTE — Assessment & Plan Note (Signed)
Latest colonoscopy 2023 showed 2.5cm TVA - rec rpt flex sig 1 yr to re-evaluate polypectomy site

## 2022-10-09 NOTE — Assessment & Plan Note (Signed)
Osteoporosis by DEXA 2019 - reviewed calcium, vit D intake, staying as active as able.  Pt declines repeat DEXA. In h/o CKD stage 5, reasonable to defer.

## 2022-10-09 NOTE — Assessment & Plan Note (Addendum)
Upcoming screening mammogram - overdue

## 2022-10-09 NOTE — Assessment & Plan Note (Signed)
TSH stable on levothyroxine daily - continue this.

## 2022-10-09 NOTE — Assessment & Plan Note (Signed)
Regularly followed by eye doctor 

## 2022-10-13 ENCOUNTER — Encounter: Payer: Self-pay | Admitting: Internal Medicine

## 2022-10-13 ENCOUNTER — Emergency Department: Payer: Medicare Other

## 2022-10-13 ENCOUNTER — Other Ambulatory Visit: Payer: Self-pay

## 2022-10-13 ENCOUNTER — Observation Stay
Admission: EM | Admit: 2022-10-13 | Discharge: 2022-10-14 | Disposition: A | Discharge status: Home / Self Care | Payer: Medicare Other | Attending: Internal Medicine | Admitting: Internal Medicine

## 2022-10-13 ENCOUNTER — Observation Stay: Payer: Medicare Other

## 2022-10-13 DIAGNOSIS — E039 Hypothyroidism, unspecified: Secondary | ICD-10-CM | POA: Insufficient documentation

## 2022-10-13 DIAGNOSIS — G319 Degenerative disease of nervous system, unspecified: Secondary | ICD-10-CM | POA: Diagnosis not present

## 2022-10-13 DIAGNOSIS — I480 Paroxysmal atrial fibrillation: Secondary | ICD-10-CM | POA: Diagnosis not present

## 2022-10-13 DIAGNOSIS — Z853 Personal history of malignant neoplasm of breast: Secondary | ICD-10-CM | POA: Diagnosis not present

## 2022-10-13 DIAGNOSIS — R41841 Cognitive communication deficit: Secondary | ICD-10-CM | POA: Diagnosis not present

## 2022-10-13 DIAGNOSIS — Z8673 Personal history of transient ischemic attack (TIA), and cerebral infarction without residual deficits: Secondary | ICD-10-CM | POA: Insufficient documentation

## 2022-10-13 DIAGNOSIS — I5032 Chronic diastolic (congestive) heart failure: Secondary | ICD-10-CM | POA: Diagnosis not present

## 2022-10-13 DIAGNOSIS — M79602 Pain in left arm: Secondary | ICD-10-CM | POA: Diagnosis not present

## 2022-10-13 DIAGNOSIS — Z87891 Personal history of nicotine dependence: Secondary | ICD-10-CM | POA: Diagnosis not present

## 2022-10-13 DIAGNOSIS — D696 Thrombocytopenia, unspecified: Secondary | ICD-10-CM | POA: Diagnosis not present

## 2022-10-13 DIAGNOSIS — E1122 Type 2 diabetes mellitus with diabetic chronic kidney disease: Secondary | ICD-10-CM | POA: Diagnosis not present

## 2022-10-13 DIAGNOSIS — E1169 Type 2 diabetes mellitus with other specified complication: Secondary | ICD-10-CM

## 2022-10-13 DIAGNOSIS — R0789 Other chest pain: Secondary | ICD-10-CM | POA: Diagnosis not present

## 2022-10-13 DIAGNOSIS — R531 Weakness: Principal | ICD-10-CM | POA: Insufficient documentation

## 2022-10-13 DIAGNOSIS — J9811 Atelectasis: Secondary | ICD-10-CM | POA: Diagnosis not present

## 2022-10-13 DIAGNOSIS — I6523 Occlusion and stenosis of bilateral carotid arteries: Secondary | ICD-10-CM | POA: Diagnosis not present

## 2022-10-13 DIAGNOSIS — N185 Chronic kidney disease, stage 5: Secondary | ICD-10-CM | POA: Diagnosis not present

## 2022-10-13 DIAGNOSIS — G919 Hydrocephalus, unspecified: Secondary | ICD-10-CM | POA: Diagnosis not present

## 2022-10-13 DIAGNOSIS — Z7901 Long term (current) use of anticoagulants: Secondary | ICD-10-CM | POA: Diagnosis not present

## 2022-10-13 DIAGNOSIS — I132 Hypertensive heart and chronic kidney disease with heart failure and with stage 5 chronic kidney disease, or end stage renal disease: Secondary | ICD-10-CM | POA: Diagnosis not present

## 2022-10-13 DIAGNOSIS — G9389 Other specified disorders of brain: Secondary | ICD-10-CM | POA: Diagnosis not present

## 2022-10-13 DIAGNOSIS — Z79899 Other long term (current) drug therapy: Secondary | ICD-10-CM | POA: Diagnosis not present

## 2022-10-13 DIAGNOSIS — I482 Chronic atrial fibrillation, unspecified: Secondary | ICD-10-CM | POA: Diagnosis present

## 2022-10-13 DIAGNOSIS — I10 Essential (primary) hypertension: Secondary | ICD-10-CM | POA: Diagnosis present

## 2022-10-13 DIAGNOSIS — R29898 Other symptoms and signs involving the musculoskeletal system: Secondary | ICD-10-CM

## 2022-10-13 DIAGNOSIS — M19012 Primary osteoarthritis, left shoulder: Secondary | ICD-10-CM | POA: Diagnosis not present

## 2022-10-13 DIAGNOSIS — I700 Atherosclerosis of aorta: Secondary | ICD-10-CM | POA: Diagnosis not present

## 2022-10-13 DIAGNOSIS — D631 Anemia in chronic kidney disease: Secondary | ICD-10-CM | POA: Insufficient documentation

## 2022-10-13 DIAGNOSIS — Z7189 Other specified counseling: Secondary | ICD-10-CM

## 2022-10-13 DIAGNOSIS — E890 Postprocedural hypothyroidism: Secondary | ICD-10-CM | POA: Diagnosis present

## 2022-10-13 DIAGNOSIS — Z043 Encounter for examination and observation following other accident: Secondary | ICD-10-CM | POA: Diagnosis not present

## 2022-10-13 DIAGNOSIS — I6381 Other cerebral infarction due to occlusion or stenosis of small artery: Secondary | ICD-10-CM | POA: Diagnosis not present

## 2022-10-13 DIAGNOSIS — M67912 Unspecified disorder of synovium and tendon, left shoulder: Secondary | ICD-10-CM | POA: Diagnosis present

## 2022-10-13 DIAGNOSIS — R079 Chest pain, unspecified: Secondary | ICD-10-CM | POA: Diagnosis not present

## 2022-10-13 DIAGNOSIS — J439 Emphysema, unspecified: Secondary | ICD-10-CM | POA: Diagnosis not present

## 2022-10-13 LAB — GLUCOSE, CAPILLARY: Glucose-Capillary: 131 mg/dL — ABNORMAL HIGH (ref 70–99)

## 2022-10-13 LAB — CBC
HCT: 35.8 % — ABNORMAL LOW (ref 36.0–46.0)
Hemoglobin: 10.9 g/dL — ABNORMAL LOW (ref 12.0–15.0)
MCH: 30.2 pg (ref 26.0–34.0)
MCHC: 30.4 g/dL (ref 30.0–36.0)
MCV: 99.2 fL (ref 80.0–100.0)
Platelets: 109 10*3/uL — ABNORMAL LOW (ref 150–400)
RBC: 3.61 MIL/uL — ABNORMAL LOW (ref 3.87–5.11)
RDW: 15.2 % (ref 11.5–15.5)
WBC: 6 10*3/uL (ref 4.0–10.5)
nRBC: 0 % (ref 0.0–0.2)

## 2022-10-13 LAB — BASIC METABOLIC PANEL
Anion gap: 10 (ref 5–15)
BUN: 46 mg/dL — ABNORMAL HIGH (ref 8–23)
CO2: 18 mmol/L — ABNORMAL LOW (ref 22–32)
Calcium: 9.5 mg/dL (ref 8.9–10.3)
Chloride: 112 mmol/L — ABNORMAL HIGH (ref 98–111)
Creatinine, Ser: 5.39 mg/dL — ABNORMAL HIGH (ref 0.44–1.00)
GFR, Estimated: 8 mL/min — ABNORMAL LOW (ref >60)
Glucose, Bld: 104 mg/dL — ABNORMAL HIGH (ref 70–99)
Potassium: 4.5 mmol/L (ref 3.5–5.1)
Sodium: 140 mmol/L (ref 135–145)

## 2022-10-13 LAB — TROPONIN I (HIGH SENSITIVITY)
Troponin I (High Sensitivity): 18 ng/L — ABNORMAL HIGH (ref <18)
Troponin I (High Sensitivity): 16 ng/L (ref <18)

## 2022-10-13 MED ORDER — AMLODIPINE BESYLATE 5 MG PO TABS
5.0000 mg | ORAL_TABLET | Freq: Once | ORAL | Status: AC
  Administered 2022-10-13: 5 mg via ORAL
  Filled 2022-10-13: qty 1

## 2022-10-13 MED ORDER — PANTOPRAZOLE SODIUM 40 MG PO TBEC: 40.0000 mg | DELAYED_RELEASE_TABLET | Freq: Every day | ORAL | Status: DC | PRN

## 2022-10-13 MED ORDER — ALLOPURINOL 100 MG PO TABS
100.0000 mg | ORAL_TABLET | Freq: Every day | ORAL | Status: DC
  Administered 2022-10-14: 100 mg via ORAL
  Filled 2022-10-13: qty 1

## 2022-10-13 MED ORDER — ACETAMINOPHEN 325 MG PO TABS
650.0000 mg | ORAL_TABLET | ORAL | Status: DC | PRN
  Administered 2022-10-14: 650 mg via ORAL
  Filled 2022-10-13: qty 2

## 2022-10-13 MED ORDER — INSULIN ASPART 100 UNIT/ML IJ SOLN
0.0000 [IU] | Freq: Three times a day (TID) | INTRAMUSCULAR | Status: DC
  Administered 2022-10-14: 2 [IU] via SUBCUTANEOUS
  Filled 2022-10-13: qty 1

## 2022-10-13 MED ORDER — ACETAMINOPHEN 160 MG/5ML PO SOLN: 650.0000 mg | ORAL | Status: DC | PRN

## 2022-10-13 MED ORDER — ASPIRIN 81 MG PO CHEW
324.0000 mg | CHEWABLE_TABLET | Freq: Once | ORAL | Status: AC
  Administered 2022-10-13: 324 mg via ORAL
  Filled 2022-10-13: qty 4

## 2022-10-13 MED ORDER — SENNOSIDES-DOCUSATE SODIUM 8.6-50 MG PO TABS: 1.0000 | ORAL_TABLET | Freq: Every evening | ORAL | Status: DC | PRN

## 2022-10-13 MED ORDER — PENTOXIFYLLINE ER 400 MG PO TBCR
400.0000 mg | EXTENDED_RELEASE_TABLET | Freq: Every day | ORAL | Status: DC
  Administered 2022-10-13 – 2022-10-14 (×2): 400 mg via ORAL
  Filled 2022-10-13 (×3): qty 1

## 2022-10-13 MED ORDER — ANASTROZOLE 1 MG PO TABS
1.0000 mg | ORAL_TABLET | Freq: Every day | ORAL | Status: DC
  Administered 2022-10-13 – 2022-10-14 (×2): 1 mg via ORAL
  Filled 2022-10-13 (×3): qty 1

## 2022-10-13 MED ORDER — ATORVASTATIN CALCIUM 20 MG PO TABS
40.0000 mg | ORAL_TABLET | Freq: Every day | ORAL | Status: DC
  Administered 2022-10-13: 40 mg via ORAL
  Filled 2022-10-13: qty 2

## 2022-10-13 MED ORDER — APIXABAN 5 MG PO TABS
5.0000 mg | ORAL_TABLET | Freq: Two times a day (BID) | ORAL | Status: DC
  Administered 2022-10-14: 5 mg via ORAL
  Filled 2022-10-13: qty 1

## 2022-10-13 MED ORDER — LEVOTHYROXINE SODIUM 88 MCG PO TABS
88.0000 ug | ORAL_TABLET | Freq: Every day | ORAL | Status: DC
  Administered 2022-10-14: 88 ug via ORAL
  Filled 2022-10-13: qty 1

## 2022-10-13 MED ORDER — ACETAMINOPHEN 650 MG RE SUPP: 650.0000 mg | RECTAL | Status: DC | PRN

## 2022-10-13 MED ORDER — ACETAMINOPHEN 325 MG PO TABS
650.0000 mg | ORAL_TABLET | Freq: Once | ORAL | Status: AC
  Administered 2022-10-13: 650 mg via ORAL
  Filled 2022-10-13: qty 2

## 2022-10-13 NOTE — Assessment & Plan Note (Addendum)
Discussed CODE STATUS extensively with patient and her daughter at bedside.  Ms. Tijerino states she is certain that she would not want CPR.  She cited her reasons as she would not want to come back to live and she would not want to go through the pain.  Her daughter states that she was quite shocked when she made this decision back during an admission in October 2023 but she would like to respect her mother's wishes.  When we talked about intubation, patient stated she would be unsure, especially if it was painful.  We discussed that intubation does not occur with the same amount of discomfort as CPR does.  Patient stated that in that case, she would want intubation as an option.  I encouraged both Ms. Lennen and her daughter to continue having goals of care discussions.  - DNR, however she would want mechanical ventilation in the case of respiratory arrest only.

## 2022-10-13 NOTE — Assessment & Plan Note (Signed)
Stable at this time 

## 2022-10-13 NOTE — Assessment & Plan Note (Signed)
Patient has a history of paroxysmal atrial fibrillation, currently in sinus rhythm.  - Will plan to restart home Eliquis pending MRI results - Holding home carvedilol pending MRI results

## 2022-10-13 NOTE — ED Provider Notes (Signed)
[facility] Provider Note    Event Date/Time   First MD Initiated Contact with Patient 10/13/22 1244     (approximate)   History   Chest Pain   HPI  Alexis Tucker is a 82 y.o. 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      Excl. in GC?     Most recent vital signs: Vitals:   10/13/22 1330 10/13/22 1550  BP:  (!) 187/91  Pulse:  69  Resp: 20   Temp:    SpO2:  98%     General: Awake, no distress although she is holding and supporting her left arm utilizing her right hand.  I placed her into a sling and she reports that that makes it feel more comfortable with not having to hold up the way of her left arm. CV:  Good peripheral perfusion.  Normal tones and rate Resp:  Normal effort.  Clear bilaterally.  Does report some tenderness to palpation over the left posterior scapular region Abd:  No distention.  Soft nontender nondistended Other:    Meticulous inspection of the axilla, left upper shoulder left arm.  She reports pain in the mid humerus left axillary region and some tracking up towards the left scapular left upper shoulder but  reports primarily her pain is present in the middle of the humerus.  She supports that with her other hand.  There is no lesion deformity or area of swelling or abscess.  She has strong palpable pulse in the left hand including radial and warm well-perfused fingers.  She reports a slight sense of dulling or numbness in all fingers of the left hand but no 1 particular nerve distribution.  Additionally, she demonstrates some weakness with flexion extension of the left elbow in part she reports due to pain but also in part she reports it feels like it is not working right.   ED Results / Procedures / Treatments   Labs (all labs ordered are listed, but only abnormal results are displayed) Labs Reviewed  BASIC METABOLIC PANEL - Abnormal; Notable for the following components:      Result Value   Chloride 112 (*)    CO2 18 (*)    Glucose, Bld 104 (*)    BUN 46 (*)  Creatinine, Ser 5.39 (*)    GFR, Estimated 8 (*)    All other components within normal limits  CBC - Abnormal; Notable for the following components:   RBC 3.61 (*)    Hemoglobin 10.9 (*)    HCT 35.8 (*)    Platelets 109 (*)    All other components within normal limits  TROPONIN I (HIGH SENSITIVITY) - Abnormal; Notable for the following components:   Troponin I (High Sensitivity) 18 (*)    All other components within normal limits  TROPONIN I (HIGH SENSITIVITY)     EKG  Interpreted by me at 1150 heart rate 70 QRS 90 QTc 450 Normal sinus rhythm, very mild ST segment abnormality with slight repolarization appearance noted in 1 and V2.  No STEMI   RADIOLOGY  CT Head Wo Contrast  Result Date: 10/13/2022 CLINICAL DATA:  Left arm weakness since this morning. EXAM: CT HEAD WITHOUT CONTRAST CT CERVICAL SPINE WITHOUT CONTRAST TECHNIQUE: Multidetector CT imaging of the head and cervical spine was performed following the standard protocol without intravenous contrast. Multiplanar CT image reconstructions of the cervical spine were  also generated. RADIATION DOSE REDUCTION: This exam was performed according to the departmental dose-optimization program which includes automated exposure control, adjustment of the mA and/or kV according to patient size and/or use of iterative reconstruction technique. COMPARISON:  Head CT, 08/17/2017. FINDINGS: CT HEAD FINDINGS Brain: No evidence of acute infarction, hemorrhage, hydrocephalus, extra-axial collection or mass lesion/mass effect. Encephalomalacia extends along the right temporal lobe to the occipital parietal lobe, associated with mild ex vacuo dilation of the atrium and temporal horn of the right lateral ventricle, consistent with an old infarct and stable. Several small basal gangliar lacunar infarcts are noted. There is bilateral patchy white matter hypoattenuation consistent with moderate chronic microvascular ischemic change. Vascular: No hyperdense vessel or unexpected calcification. Skull: Normal. Negative for fracture or focal lesion. Sinuses/Orbits: Globes and orbits are unremarkable. Sinuses are clear. Other: None. CT CERVICAL SPINE FINDINGS Alignment: Normal. Skull base and vertebrae: No acute fracture. No primary bone lesion or focal pathologic process. Soft tissues and spinal canal: No prevertebral fluid or swelling. No visible canal hematoma. Disc levels: Mild to moderate loss of disc height at C4-C5. Moderate loss of disc height at C5-C6. Moderate loss of disc height it C7-T1. Minor disc bulging. No disc herniation. Facet degenerative changes, greatest on the right at C4-C5 and on the left at C3-C4. Upper chest: No acute or significant abnormality. Other: None. IMPRESSION: HEAD CT 1. No acute intracranial abnormalities. 2. 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 CERVICAL CT 1. No fracture or acute finding. Electronically Signed   By: Amie Portland M.D.   On: 10/13/2022 14:58   CT Cervical Spine Wo Contrast  Result Date:  10/13/2022 CLINICAL DATA:  Left arm weakness since this morning. EXAM: CT HEAD WITHOUT CONTRAST CT CERVICAL SPINE WITHOUT CONTRAST TECHNIQUE: Multidetector CT imaging of the head and cervical spine was performed following the standard protocol without intravenous contrast. Multiplanar CT image reconstructions of the cervical spine were also generated. RADIATION DOSE REDUCTION: This exam was performed according to the departmental dose-optimization program which includes automated exposure control, adjustment of the mA and/or kV according to patient size and/or use of iterative reconstruction technique. COMPARISON:  Head CT, 08/17/2017. FINDINGS: CT HEAD FINDINGS Brain: No evidence of acute infarction, hemorrhage, hydrocephalus, extra-axial collection or mass lesion/mass effect. Encephalomalacia extends along the right temporal lobe to the occipital parietal lobe, associated  with mild ex vacuo dilation of the atrium and temporal horn of the right lateral ventricle, consistent with an old infarct and stable. Several small basal gangliar lacunar infarcts are noted. There is bilateral patchy white matter hypoattenuation consistent with moderate chronic microvascular ischemic change. Vascular: No hyperdense vessel or unexpected calcification. Skull: Normal. Negative for fracture or focal lesion. Sinuses/Orbits: Globes and orbits are unremarkable. Sinuses are clear. Other: None. CT CERVICAL SPINE FINDINGS Alignment: Normal. Skull base and vertebrae: No acute fracture. No primary bone lesion or focal pathologic process. Soft tissues and spinal canal: No prevertebral fluid or swelling. No visible canal hematoma. Disc levels: Mild to moderate loss of disc height at C4-C5. Moderate loss of disc height at C5-C6. Moderate loss of disc height it C7-T1. Minor disc bulging. No disc herniation. Facet degenerative changes, greatest on the right at C4-C5 and on the left at C3-C4. Upper chest: No acute or significant abnormality.  Other: None. IMPRESSION: HEAD CT 1. No acute intracranial abnormalities. 2. 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 CERVICAL CT 1. No fracture or acute finding. Electronically Signed   By: Amie Portland M.D.   On: 10/13/2022 14:58   CT Chest Wo Contrast  Result Date: 10/13/2022 CLINICAL DATA:  Chest pain. EXAM: CT CHEST WITHOUT CONTRAST TECHNIQUE: Multidetector CT imaging of the chest was performed following the standard protocol without IV contrast. RADIATION DOSE REDUCTION: This exam was performed according to the departmental dose-optimization program which includes automated exposure control, adjustment of the mA and/or kV according to patient size and/or use of iterative reconstruction technique. COMPARISON:  None Available. FINDINGS: Cardiovascular: Atherosclerosis of thoracic aorta is noted without aneurysm formation. Normal cardiac size. No pericardial effusion. Mild coronary artery calcifications are noted. Mediastinum/Nodes: No enlarged mediastinal or axillary lymph nodes. Thyroid gland, trachea, and esophagus demonstrate no significant findings. Lungs/Pleura: No pneumothorax or pleural effusion is noted. Minimal emphysematous disease is noted. Minimal bibasilar subsegmental atelectasis is noted. Upper Abdomen: No acute abnormality. Musculoskeletal: No chest wall mass or suspicious bone lesions identified. IMPRESSION: Minimal bibasilar subsegmental atelectasis. No other acute abnormality seen in the chest. Mild coronary artery calcifications are noted. Aortic Atherosclerosis (ICD10-I70.0) and Emphysema (ICD10-J43.9). Electronically Signed   By: Lupita Raider M.D.   On: 10/13/2022 14:57   DG Chest 2 View  Result Date: 10/13/2022 CLINICAL DATA:  Chest pain EXAM: CHEST - 2 VIEW COMPARISON:  Chest x-ray dated 04/22/2022. FINDINGS: Heart size and mediastinal contours are within normal limits. Lungs are clear. No pleural effusion or pneumothorax is seen.  Chronic elevation of the RIGHT hemidiaphragm. No acute-appearing osseous abnormality is identified. IMPRESSION: No active cardiopulmonary disease. No evidence of pneumonia or pulmonary edema. Electronically Signed   By: Bary Richard M.D.   On: 10/13/2022 12:25      PROCEDURES:  Critical Care performed: No  Procedures   MEDICATIONS ORDERED IN ED: Medications  acetaminophen (TYLENOL) tablet 650 mg (650 mg Oral Given 10/13/22 1350)  amLODipine (NORVASC) tablet 5 mg (5 mg Oral Given 10/13/22 1351)  aspirin chewable tablet 324 mg (324 mg Oral Given 10/13/22 1538)     IMPRESSION / MDM / ASSESSMENT AND PLAN / ED COURSE  I reviewed the triage vital signs and the nursing notes.                              Differential diagnosis includes, but is not limited to, possible deep-seated injury such  as musculoskeletal, abscess, bone lesion, or central causation of pain such as stroke, impingement, thoracic outlet etc.  She has a warm well-perfused left upper extremity and she is actively anticoagulated making acute thrombotic event unlikely, and shows no signs of arterial occlusion.  She reports a pain that worsens with movement but is present at rest primarily in the left upper humerus left shoulder region.  I will obtain imaging without contrast given her renal disease not currently on hemodialysis to evaluate for possible central causes, spinal cord injury or lesion, or potential bony lesions.  No trauma or injury reported.  Awoke with pain from rest.  Other causes such as referred pain from the chest cardiac etc. are considered.  Awaiting troponin, initial ECG does not demonstrate obvious ischemia.  Her clinical history seems very atypical of ACS or an obvious central chest cause.  Difficult to exclude causation such as dissection though given the location of her pain I think this would be quite unlikely and we are not limited to noncontrasted imaging at this time  Patient's presentation is most  consistent with acute complicated illness / injury requiring diagnostic workup.   The patient is on the cardiac monitor to evaluate for evidence of arrhythmia and/or significant heart rate changes.   ----------------------------------------- 3:47 PM on 10/13/2022 ----------------------------------------- It is not clear yet as to the cause of the patient's weakness in the left arm, not clear if this may represent a neuropathy or more central cause but she does have a history and appears to have had prior stroke as well as basal ganglia infarcts, consideration for potential stroke or central causes given still.  Given this ongoing pain and discomfort discussed with patient and her daughter I would recommend she be admitted to the hospital for further care and treatment.  She continues to have intermittent pain primarily in the left humerus, but reports that the left hand and left arm do continue to feel weak as well.  She is well outside any thrombolytic window symptoms began when she woke up early this morning and she has no evidence of large vessel occlusion  Consulted with Dr. Huel Cote who is accepting of admission.     FINAL CLINICAL IMPRESSION(S) / ED DIAGNOSES   Final diagnoses:  Left arm weakness  Left arm pain  Atypical chest pain     Rx / DC Orders   ED Discharge Orders     None        Note:  This document was prepared using Dragon voice recognition software and may include unintentional dictation errors.   Sharyn Creamer, MD 10/13/22 1558

## 2022-10-13 NOTE — Assessment & Plan Note (Signed)
Currently managed with Tradjenta only.  A1c well-controlled.  - SSI, sensitive - Hold home Tradjenta

## 2022-10-13 NOTE — Assessment & Plan Note (Signed)
Continue home levothyroxine 

## 2022-10-13 NOTE — Assessment & Plan Note (Signed)
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  No indication for urgent or emergent dialysis at this time  - Continue to monitor electrolytes and renal function while admitted

## 2022-10-13 NOTE — ED Triage Notes (Signed)
Pt sts that last night she started to have left arm pain than she started to have chest pain about 10 min later. Pt sts that she has not taken any OTC meds.

## 2022-10-13 NOTE — Assessment & Plan Note (Signed)
-   Permissive hypertension until CVA is definitively ruled out

## 2022-10-13 NOTE — Assessment & Plan Note (Addendum)
Patient is presenting with newly discovered left arm pain with radiation of pain down to her wrist in addition to left arm weakness.  On examination, she continues to have left upper arm pain near the axilla, distal weakness in addition to very subtle weakness of the left leg compared to the right.  Overall, differential includes TIA/CVA versus cervical radiculopathy or a peripheral neuropathy given dermatome affected.  Left leg weakness could be due to prior stroke.  Given multiple risk factors, will evaluate with MRI of the brain.   - No indication for telemetry monitoring given known atrial fibrillation not on AC - MRI brain without contrast - Carotid Dopplers - Echocardiogram ordered - Start statin given LDL above goal based on lipid panel obtained 2 weeks ago - S/p aspirin 324 mg in the ED - Permissive hypertension until CVA is ruled out - Hold home Eliquis until CVA is ruled out - If MRI demonstrates CVA, will plan consulting neurology - PT/OT

## 2022-10-13 NOTE — Assessment & Plan Note (Signed)
Patient stated that she had a brief episode of chest pain that occurred at rest and self resolved without any recurrence.  Troponin is negative x 2 and EKG with no concerning findings.  Inconsistent with ischemic disease  - Continue outpatient follow-up with cardiology - Echocardiogram ordered per TIA/CVA workup

## 2022-10-13 NOTE — H&P (Signed)
History and Physical    Patient: Alexis Tucker UJW:[id number] DOB: [birth year] DOA: 10/13/2022 DOS: the patient was seen and examined on 10/13/2022 PCP: Eustaquio Boyden, MD  Patient coming from: Home  Chief Complaint:  Chief Complaint  Patient presents with   Chest Pain   HPI: Alexis Tucker is a 82 y.o. 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  Mrs. Blaker states that when she awoke this morning, she was experiencing left upper arm and axilla pain that radiated down into her left hand.  In addition, she was having weakness with difficulty lifting the arm and gripping objects.  She denies any other focal weakness.  She states when she went to bed the night before around midnight, she was at her baseline.  She denies any recent illness, palpitations, shortness of breath,  or lower extremity swelling.  She noted a brief moment of chest pain that occurred at rest and self resolved.  She has not missed any doses of her home Eliquis.  Her daughter at bedside states she helps manage her medications to ensure no doses are missed.  Her daughter denies noting any dysarthria or facial drooping.  ED course: On arrival to the ED, patient was normotensive at 171/89 with heart rate of 71.  She was saturating at 100% on room air.  She was afebrile at 98.5. Initial workup includes WBC of 6.0, hemoglobin 10.9, platelets 109, potassium 4.5, bicarb 18, glucose 104, BUN 46, creatinine 5.39 with GFR of 8.  Troponin elevated at 18 with downtrend to 16.  CT head, chest, C-spine, humerus obtained; no acute intracranial abnormalities, mild bibasilar segmental atelectasis, no acute cervical pathology, and no evidence of acute fracture of the humerus.  Small glenohumeral joint  effusion noted with moderate arthritis.  Due to high risk for CVA, [facility] contacted for admission.  Review of Systems: As mentioned in the history of present illness. All other systems reviewed and are negative.  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  a.) Avastin injections as [facility]   DJD (degenerative joint disease)    ESRD (end stage renal disease)    GERD (gastroesophageal reflux disease)    Glaucoma 03/2014   History of colon cancer 2006   a.) s/p colectomy   History of hepatitis A 1990s   from food, resolved   HTN (hypertension)    Hyperplastic colon polyp    Long term current use of anticoagulant    a.) apixaban   Long  term current use of antithrombotics/antiplatelets    a.) pentoxifylline   Macular degeneration    wet R with vision loss, dry L; to see retina specialist   Neuropathy    OA (osteoarthritis)    Osteonecrosis    hips?   PONV (postoperative nausea and vomiting)    Post-surgical hypothyroidism    Thrombocytopenia    Tubular adenoma    Wears dentures    partial upper   Past Surgical History:  Procedure Laterality Date   A/V FISTULAGRAM Right 06/08/2022   Procedure: A/V Fistulagram;  Surgeon: Annice Needy, MD;  Location: [facility] INVASIVE CV [facility];  Service: Cardiovascular;  Laterality: Right;   ABDOMINAL HYSTERECTOMY  1976   partial, after tubal pregnancy, h/o fibroids with heavy bleeding   AV FISTULA PLACEMENT Right 02/01/2022   Procedure: INSERTION OF ARTERIOVENOUS (AV) GORE-TEX GRAFT ARM (LEFT UPPER EXTREMITY);  Surgeon: Annice Needy, MD;  Location: [facility] ORS;  Service: Vascular;  Laterality: Right;   BREAST BIOPSY Left 2008   positive   BREAST BIOPSY Left 11/22/2017   2oclock 2cmfn wing shaped clip, INVASIVE MAMMARY CARCINOMA   BREAST BIOPSY Left 11/22/2017   2oclock 4cmfn coil shaped clip, INVASIVE MAMMARY CARCINOMA   BREAST BIOPSY Left 11/22/2017   lymph node - butterfly hydroMARK   BREAST EXCISIONAL BIOPSY Left 01/07/2018   lumpectomy by Dr. Lemar Livings   BREAST LUMPECTOMY Left 2008   F/U radiation   BREAST LUMPECTOMY Left 2019   2 areas of [facility] at 2:00 position f/u with mammosite   BREAST LUMPECTOMY WITH SENTINEL LYMPH NODE BIOPSY Left 01/07/2018   Procedure: BREAST LUMPECTOMY WITH SENTINEL LYMPH NODE BX;  Surgeon: Earline Mayotte, MD;  Location: [facility] ORS;  Service: General;  Laterality: Left;   BREAST MAMMOSITE  2008   carotid ultrasound  12/2012   1-39% bilateral stenosis, severe in ECA   CATARACT EXTRACTION W/PHACO Left 08/14/2016   Procedure: CATARACT EXTRACTION PHACO AND INTRAOCULAR LENS PLACEMENT (IOC)  Left Diabetic;  Surgeon: Sherald Hess, MD;  Location: [facility];  Service: Ophthalmology;  Laterality: Left;  Diabetic - oral meds   CHOLECYSTECTOMY  2005   COLECTOMY  2005   colon cancer   COLON SURGERY  2006   bowel obstruction   COLONOSCOPY  2008   ext hem, ileo-colonic anastomosis in cecum, tubular adenoma x1, rec rpt 1 yr for multiple polyps (in [place])   COLONOSCOPY WITH PROPOFOL N/A 10/19/2021   2.5cm TVA with low grade dysplasia, diverticulosis, no rpt recommended [name], [name], MD)   ESOPHAGOGASTRODUODENOSCOPY N/A 05/06/2016   Procedure: ESOPHAGOGASTRODUODENOSCOPY (EGD);  Surgeon: Scot Jun, MD;  Location: [facility] ENDOSCOPY;  Service: Endoscopy;  Laterality: N/A;   EYE SURGERY     JOINT REPLACEMENT     THYROIDECTOMY, PARTIAL     unclear why   TOTAL KNEE ARTHROPLASTY Right 1999   UPPER EXTREMITY ANGIOGRAPHY Right 05/18/2022   Procedure: Upper Extremity Angiography;  Surgeon: Annice Needy, MD;  Location: [facility] INVASIVE CV [facility];  Service:  Cardiovascular;  Laterality: Right;   US ECHOCARDIOGRAPHY  11/2007   nl LV fxn EF 60%, diastolic dysfunction, LVH, tr TR and MR, slcerotic aortic valve   VEIN LIGATION AND STRIPPING     Social History:  reports that she has quit smoking. She has never used smokeless tobacco. She reports that she does not drink alcohol and does not use drugs.  Allergies  Allergen Reactions   Tramadol Other (See Comments)    nightmares   Codeine Anxiety   Sulfa Antibiotics Itching and Rash    Family History  Problem Relation Age of Onset   Diabetes Mother    Hypertension Mother    Arthritis Mother    Alcohol abuse Father    Arthritis Father    Stroke Daughter    CAD Neg Hx    Cancer Neg Hx    Breast cancer Neg Hx     Prior to Admission medications   Medication Sig Start Date End Date Taking? Authorizing Provider  allopurinol (ZYLOPRIM) 100 MG tablet Take 1 tablet (100 mg total) by mouth daily. For gout 10/03/22   Eustaquio Boyden, MD  amLODipine (NORVASC) 5 MG tablet Take 1 tablet (5 mg total) by  mouth daily. 06/06/22 06/06/23  Debbe Odea, MD  anastrozole (ARIMIDEX) 1 MG tablet Take 1 tablet (1 mg total) by mouth daily. 02/20/22   Earna Coder, MD  apixaban (ELIQUIS) 5 MG TABS tablet Take 1 tablet (5 mg total) by mouth 2 (two) times daily. As blood thinner 10/03/22   Eustaquio Boyden, MD  Blood Glucose Monitoring Suppl [facility] BLOOD GLUCOSE METER) W/DEVICE KIT Use as directed to check sugars daily 250.42 03/13/14   Eustaquio Boyden, MD  carvedilol (COREG) 25 MG tablet Take 0.5 tablets (12.5 mg total) by mouth 2 (two) times daily with a meal. 05/04/22   Debbe Odea, MD  Cholecalciferol (VITAMIN D-3) 5000 UNITS TABS Take 1 tablet by mouth daily.    [provider]  isosorbide dinitrate (ISORDIL) 20 MG tablet Take 1 tablet (20 mg total) by mouth daily. For heart/blood pressure 10/03/22   Eustaquio Boyden, MD  levothyroxine (SYNTHROID) 88 MCG tablet Take 1 tablet (88 mcg total) by mouth daily. For hypothyroidism 10/03/22   Eustaquio Boyden, MD  linagliptin (TRADJENTA) 5 MG TABS tablet Take 1 tablet (5 mg total) by mouth daily. 07/10/22   Eustaquio Boyden, MD  pantoprazole (PROTONIX) 40 MG tablet Take 1 tablet (40 mg total) by mouth daily as needed. For reflux 10/03/22   Eustaquio Boyden, MD  pentoxifylline (TRENTAL) 400 MG CR tablet Take 1 tablet (400 mg total) by mouth daily. For circulation 10/03/22   Eustaquio Boyden, MD    Physical Exam: Vitals:   10/13/22 1630 10/13/22 1733 10/13/22 1800 10/13/22 1858  BP: (!) 164/43 (!) 121/111 (!) 149/74 (!) 145/98  Pulse: 84 80  78  Resp: (!) 21 19 20 18   Temp:  97.9 F (36.6 C)  98 F (36.7 C)  TempSrc:  Axillary  Oral  SpO2: 100% 98%  98%  Weight:    77.8 kg  Height:    5\' 3"  (1.6 m)   Physical Exam Vitals and nursing note reviewed.  Constitutional:      General: She is not in acute distress.    Appearance: She is normal weight. She is not ill-appearing.  HENT:     Head: Normocephalic and atraumatic.  Eyes:      Extraocular Movements: Extraocular movements intact.     Pupils: Pupils are equal, round,  and reactive to light.  Cardiovascular:     Rate and Rhythm: Normal rate and regular rhythm.     Heart sounds: No murmur heard. Pulmonary:     Effort: Pulmonary effort is normal. No respiratory distress.     Breath sounds: No wheezing, rhonchi or rales.  Musculoskeletal:     Cervical back: Neck supple.     Right lower leg: No tenderness. No edema.     Left lower leg: No tenderness. No edema.  Skin:    General: Skin is warm and dry.  Neurological:     Mental Status: She is alert.     Comments:  - Patient is alert and oriented x 3 - No facial asymmetry or dysarthria - Cranial nerves II through X intact - Sensation intact throughout - Right upper extremity 5 out of 5 strength.  Left upper extremity 5 out of 5 strength peripherally, however 3-4 out of 5 strength with distal flexion and grip - Right lower extremity 5 out of 5 strength - Left lower extremity 4 out of 5 strength - Bilateral dysmetria noted that is mild, however difficult to judge as patient is visually impaired  Psychiatric:        Mood and Affect: Mood normal.        Behavior: Behavior normal.    Data Reviewed: CBC with WBC of 6.0, hemoglobin 10.9, platelets 109 BMP with sodium of 140, potassium 4.5, chloride 112, bicarb 18, glucose 104, BUN 46, creatinine 5.39, GFR of 8 Troponin elevated 18 with flat trend to 60  EKG Reviewed.  Sinus rhythm with rate of 74.  No ST or T wave changes consistent with acute ischemia.  CT HUMERUS LEFT WO CONTRAST  Result Date: 10/13/2022 CLINICAL DATA:  Left arm pain EXAM: CT OF THE UPPER LEFT EXTREMITY WITHOUT CONTRAST TECHNIQUE: Multidetector CT imaging of the upper left extremity was performed according to the standard protocol. RADIATION DOSE REDUCTION: This exam was performed according to the departmental dose-optimization program which includes automated exposure control, adjustment of the mA  and/or kV according to patient size and/or use of iterative reconstruction technique. COMPARISON:  None Available. FINDINGS: Bones/Joint/Cartilage There is no evidence of acute fracture. There is moderate glenohumeral and AC joint osteoarthritis. Greater tuberosity spurring with intraosseous ganglion formation. No aggressive osseous lesion. Small glenohumeral joint effusion. No elbow joint effusion. Ligaments Suboptimally assessed by CT. Muscles and Tendons No acute myotendinous abnormality by CT. No significant muscle atrophy. Soft tissues No focal fluid collection. There is skin thickening and edematous appearance of the left breast with prominent left breast soft tissue, area measuring 3.8 x 2.4 cm (series 5, image 70), and adjacent small fluid density collection measuring 3.0 x 1.4 cm (series 5, image 77). These findings correlates with findings on recent ultrasound and mammogram, please refer to these reports on 10/09/2022 for recommendations. IMPRESSION: No evidence of acute fracture.  Small glenohumeral joint effusion. Moderate glenohumeral and AC joint osteoarthritis. 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 Left breast findings which correlate with recent mammogram and ultrasound on 10/09/2022, please refer to these reports for recommendations. Electronically Signed   By: Caprice Renshaw M.D.   On: 10/13/2022 15:30   CT Head Wo Contrast  Result Date: 10/13/2022 CLINICAL DATA:  Left arm weakness since this morning. EXAM: CT HEAD WITHOUT CONTRAST CT CERVICAL SPINE WITHOUT CONTRAST TECHNIQUE: Multidetector CT imaging of the head and cervical spine was performed following the standard protocol without intravenous contrast. Multiplanar CT image reconstructions of the  cervical spine were also generated. RADIATION DOSE REDUCTION: This exam was performed according to the departmental dose-optimization program which includes automated exposure control, adjustment of the mA and/or kV according to  patient size and/or use of iterative reconstruction technique. COMPARISON:  Head CT, 08/17/2017. FINDINGS: CT HEAD FINDINGS Brain: No evidence of acute infarction, hemorrhage, hydrocephalus, extra-axial collection or mass lesion/mass effect. Encephalomalacia extends along the right temporal lobe to the occipital parietal lobe, associated with mild ex vacuo dilation of the atrium and temporal horn of the right lateral ventricle, consistent with an old infarct and stable. Several small basal gangliar lacunar infarcts are noted. There is bilateral patchy white matter hypoattenuation consistent with moderate chronic microvascular ischemic change. Vascular: No hyperdense vessel or unexpected calcification. Skull: Normal. Negative for fracture or focal lesion. Sinuses/Orbits: Globes and orbits are unremarkable. Sinuses are clear. Other: None. CT CERVICAL SPINE FINDINGS Alignment: Normal. Skull base and vertebrae: No acute fracture. No primary bone lesion or focal pathologic process. Soft tissues and spinal canal: No prevertebral fluid or swelling. No visible canal hematoma. Disc levels: Mild to moderate loss of disc height at C4-C5. Moderate loss of disc height at C5-C6. Moderate loss of disc height it C7-T1. Minor disc bulging. No disc herniation. Facet degenerative changes, greatest on the right at C4-C5 and on the left at C3-C4. Upper chest: No acute or significant abnormality. Other: None. IMPRESSION: HEAD CT 1. No acute intracranial abnormalities. 2. 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 CERVICAL CT 1. No fracture or acute finding. Electronically Signed   By: Amie Portland M.D.   On: 10/13/2022 14:58   CT Cervical Spine Wo Contrast  Result Date: 10/13/2022 CLINICAL DATA:  Left arm weakness since this morning. EXAM: CT HEAD WITHOUT CONTRAST CT CERVICAL SPINE WITHOUT CONTRAST TECHNIQUE: Multidetector CT imaging of the head and cervical spine was performed following  the standard protocol without intravenous contrast. Multiplanar CT image reconstructions of the cervical spine were also generated. RADIATION DOSE REDUCTION: This exam was performed according to the departmental dose-optimization program which includes automated exposure control, adjustment of the mA and/or kV according to patient size and/or use of iterative reconstruction technique. COMPARISON:  Head CT, 08/17/2017. FINDINGS: CT HEAD FINDINGS Brain: No evidence of acute infarction, hemorrhage, hydrocephalus, extra-axial collection or mass lesion/mass effect. Encephalomalacia extends along the right temporal lobe to the occipital parietal lobe, associated with mild ex vacuo dilation of the atrium and temporal horn of the right lateral ventricle, consistent with an old infarct and stable. Several small basal gangliar lacunar infarcts are noted. There is bilateral patchy white matter hypoattenuation consistent with moderate chronic microvascular ischemic change. Vascular: No hyperdense vessel or unexpected calcification. Skull: Normal. Negative for fracture or focal lesion. Sinuses/Orbits: Globes and orbits are unremarkable. Sinuses are clear. Other: None. CT CERVICAL SPINE FINDINGS Alignment: Normal. Skull base and vertebrae: No acute fracture. No primary bone lesion or focal pathologic process. Soft tissues and spinal canal: No prevertebral fluid or swelling. No visible canal hematoma. Disc levels: Mild to moderate loss of disc height at C4-C5. Moderate loss of disc height at C5-C6. Moderate loss of disc height it C7-T1. Minor disc bulging. No disc herniation. Facet degenerative changes, greatest on the right at C4-C5 and on the left at C3-C4. Upper chest: No acute or significant abnormality. Other: None. IMPRESSION: HEAD CT 1. No acute intracranial abnormalities. 2. 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 CERVICAL CT 1. No fracture or acute  finding. Electronically  Signed   By: Amie Portland M.D.   On: 10/13/2022 14:58   CT Chest Wo Contrast  Result Date: 10/13/2022 CLINICAL DATA:  Chest pain. EXAM: CT CHEST WITHOUT CONTRAST TECHNIQUE: Multidetector CT imaging of the chest was performed following the standard protocol without IV contrast. RADIATION DOSE REDUCTION: This exam was performed according to the departmental dose-optimization program which includes automated exposure control, adjustment of the mA and/or kV according to patient size and/or use of iterative reconstruction technique. COMPARISON:  None Available. FINDINGS: Cardiovascular: Atherosclerosis of thoracic aorta is noted without aneurysm formation. Normal cardiac size. No pericardial effusion. Mild coronary artery calcifications are noted. Mediastinum/Nodes: No enlarged mediastinal or axillary lymph nodes. Thyroid gland, trachea, and esophagus demonstrate no significant findings. Lungs/Pleura: No pneumothorax or pleural effusion is noted. Minimal emphysematous disease is noted. Minimal bibasilar subsegmental atelectasis is noted. Upper Abdomen: No acute abnormality. Musculoskeletal: No chest wall mass or suspicious bone lesions identified. IMPRESSION: Minimal bibasilar subsegmental atelectasis. No other acute abnormality seen in the chest. Mild coronary artery calcifications are noted. Aortic Atherosclerosis (ICD10-I70.0) and Emphysema (ICD10-J43.9). Electronically Signed   By: Lupita Raider M.D.   On: 10/13/2022 14:57   DG Chest 2 View  Result Date: 10/13/2022 CLINICAL DATA:  Chest pain EXAM: CHEST - 2 VIEW COMPARISON:  Chest x-ray dated 04/22/2022. FINDINGS: Heart size and mediastinal contours are within normal limits. Lungs are clear. No pleural effusion or pneumothorax is seen. Chronic elevation of the RIGHT hemidiaphragm. No acute-appearing osseous abnormality is identified. IMPRESSION: No active cardiopulmonary disease. No evidence of pneumonia or pulmonary edema. Electronically Signed   By: Bary Richard M.D.   On: 10/13/2022 12:25    There are no new results to review at this time.  Assessment and Plan:  * Left arm weakness Patient is presenting with newly discovered left arm pain with radiation of pain down to her wrist in addition to left arm weakness.  On examination, she continues to have left upper arm pain near the axilla, distal weakness in addition to very subtle weakness of the left leg compared to the right.  Overall, differential includes TIA/CVA versus cervical radiculopathy or a peripheral neuropathy given dermatome affected.  Left leg weakness could be due to prior stroke.  Given multiple risk factors, will evaluate with MRI of the brain.   - No indication for telemetry monitoring given known atrial fibrillation not on AC - MRI brain without contrast - Carotid Dopplers - Echocardiogram ordered - Start statin given LDL above goal based on lipid panel obtained 2 weeks ago - S/p aspirin 324 mg in the ED - Permissive hypertension until CVA is ruled out - Hold home Eliquis until CVA is ruled out - If MRI demonstrates CVA, will plan consulting neurology - PT/OT  Atypical chest pain Patient stated that she had a brief episode of chest pain that occurred at rest and self resolved without any recurrence.  Troponin is negative x 2 and EKG with no concerning findings.  Inconsistent with ischemic disease  - Continue outpatient follow-up with cardiology - Echocardiogram ordered per TIA/CVA workup  Paroxysmal atrial fibrillation Patient has a history of paroxysmal atrial fibrillation, currently in sinus rhythm.  - Will plan to restart home Eliquis pending MRI results - Holding home carvedilol pending MRI results  HTN (hypertension) - Permissive hypertension until CVA is definitively ruled out  CKD stage 5 due to type 2 diabetes mellitus Patient has a history of CKD stage V secondary to type  2 diabetes, currently in the process of initiating dialysis.  No indication for  urgent or emergent dialysis at this time  - Continue to monitor electrolytes and renal function while admitted  Type 2 diabetes mellitus with other specified complication Currently managed with Tradjenta only.  A1c well-controlled.  - SSI, sensitive - Hold home Tradjenta  Post-surgical hypothyroidism - Continue home levothyroxine  Advanced care planning/counseling discussion Discussed CODE STATUS extensively with patient and her daughter at bedside.  Ms. Cannady states she is certain that she would not want CPR.  She cited her reasons as she would not want to come back to live and she would not want to go through the pain.  Her daughter states that she was quite shocked when she made this decision back during an admission in October 2023 but she would like to respect her mother's wishes.  When we talked about intubation, patient stated she would be unsure, especially if it was painful.  We discussed that intubation does not occur with the same amount of discomfort as CPR does.  Patient stated that in that case, she would want intubation as an option.  I encouraged both Ms. Pion and her daughter to continue having goals of care discussions.  - DNR, however she would want mechanical ventilation in the case of respiratory arrest only.  Thrombocytopenia Stable at this time.  Anemia due to stage 5 chronic kidney disease, not on chronic dialysis Stable at this time.  Advance Care Planning:   Code Status: DNR.  Please see above for advance care discussion.   Consults: None  Family Communication: Patient's daughter updated at bedside  Severity of Illness: The appropriate patient status for this patient is OBSERVATION. Observation status is judged to be reasonable and necessary in order to provide the required intensity of service to ensure the patient's safety. The patient's presenting symptoms, physical exam findings, and initial radiographic and laboratory data in the context of their medical  condition is felt to place them at decreased risk for further clinical deterioration. Furthermore, it is anticipated that the patient will be medically stable for discharge from the hospital within 2 midnights of admission.   Author: Verdene Lennert, MD 10/13/2022 7:31 PM  For on call review [email or web address].

## 2022-10-14 ENCOUNTER — Encounter: Payer: Self-pay | Admitting: Internal Medicine

## 2022-10-14 ENCOUNTER — Observation Stay
Admit: 2022-10-14 | Discharge: 2022-10-14 | Disposition: A | Discharge status: Home / Self Care | Payer: Medicare Other | Attending: Internal Medicine | Admitting: Internal Medicine

## 2022-10-14 DIAGNOSIS — R29898 Other symptoms and signs involving the musculoskeletal system: Secondary | ICD-10-CM | POA: Diagnosis not present

## 2022-10-14 DIAGNOSIS — M67912 Unspecified disorder of synovium and tendon, left shoulder: Secondary | ICD-10-CM | POA: Diagnosis not present

## 2022-10-14 DIAGNOSIS — M19012 Primary osteoarthritis, left shoulder: Secondary | ICD-10-CM | POA: Diagnosis not present

## 2022-10-14 DIAGNOSIS — R531 Weakness: Secondary | ICD-10-CM | POA: Diagnosis not present

## 2022-10-14 LAB — BASIC METABOLIC PANEL
Anion gap: 11 (ref 5–15)
BUN: 43 mg/dL — ABNORMAL HIGH (ref 8–23)
CO2: 14 mmol/L — ABNORMAL LOW (ref 22–32)
Calcium: 9.3 mg/dL (ref 8.9–10.3)
Chloride: 113 mmol/L — ABNORMAL HIGH (ref 98–111)
Creatinine, Ser: 5.35 mg/dL — ABNORMAL HIGH (ref 0.44–1.00)
GFR, Estimated: 8 mL/min — ABNORMAL LOW (ref >60)
Glucose, Bld: 98 mg/dL (ref 70–99)
Potassium: 4.3 mmol/L (ref 3.5–5.1)
Sodium: 138 mmol/L (ref 135–145)

## 2022-10-14 LAB — GLUCOSE, CAPILLARY
Glucose-Capillary: 104 mg/dL — ABNORMAL HIGH (ref 70–99)
Glucose-Capillary: 155 mg/dL — ABNORMAL HIGH (ref 70–99)

## 2022-10-14 MED ORDER — ISOSORBIDE DINITRATE 20 MG PO TABS
20.0000 mg | ORAL_TABLET | Freq: Every day | ORAL | Status: DC
  Administered 2022-10-14: 20 mg via ORAL
  Filled 2022-10-14: qty 1

## 2022-10-14 MED ORDER — AMLODIPINE BESYLATE 5 MG PO TABS
5.0000 mg | ORAL_TABLET | Freq: Every day | ORAL | Status: DC
  Administered 2022-10-14: 5 mg via ORAL
  Filled 2022-10-14: qty 1

## 2022-10-14 MED ORDER — CARVEDILOL 6.25 MG PO TABS
12.5000 mg | ORAL_TABLET | Freq: Two times a day (BID) | ORAL | Status: DC
  Administered 2022-10-14: 12.5 mg via ORAL
  Filled 2022-10-14: qty 2

## 2022-10-14 NOTE — Discharge Summary (Signed)
Physician Discharge Summary   Patient: Alexis Tucker MRN: [id number] DOB: 06-Jun-1941  Admit date:     10/13/2022  Discharge date: 10/14/22  Discharge Physician: Lurene Shadow   PCP: Eustaquio Boyden, MD   Recommendations at discharge:   Follow-up with PCP in 1 week  Discharge Diagnoses: Principal Problem:   Left arm weakness Active Problems:   Atypical chest pain   HTN (hypertension)   Paroxysmal atrial fibrillation   CKD stage 5 due to type 2 diabetes mellitus   Type 2 diabetes mellitus with other specified complication   Post-surgical hypothyroidism   Advanced care planning/counseling discussion   Thrombocytopenia   Anemia due to stage 5 chronic kidney disease, not on chronic dialysis  Resolved Problems:   * No resolved hospital problems. [facility] Course:  [name] is a 82 y.o. female with medical history significant of atrial fibrillation on Eliquis, chronic HFpEF, CVA, type 2 diabetes, hypertension, hyperlipidemia, anemia of chronic disease, chronic thrombocytopenia, CKD stage V in the process of initiating HD, breast cancer s/p lumpectomy and adjuvant radiation.  She presented to the hospital with left upper arm pain and left axilla pain that radiated to the left arm.  She also noticed weakness in the left upper extremity.  He said his symptoms started after she woke up from sleep.  She was admitted to the hospital for chest pain and stroke workup.  Troponins were unremarkable and CT head and MRI brain did not show any evidence of acute stroke.  CT left upper extremity showed moderate glenohumeral and AC joint osteoarthritis and chronic distal rotator cuff disease.  This is probably responsible for symptoms.  Her condition has improved and she is asymptomatic at this time.  She is deemed stable for discharge to home today.  Discharge plan was discussed with the patient and [name], daughter, at the bedside.        Consultants: None Procedures performed: None   Disposition: Home Diet recommendation:  Discharge Diet Orders (From admission, onward)     Start     Ordered   10/14/22 0000  Diet - low sodium heart healthy        10/14/22 1327   10/14/22 0000  Diet Carb Modified        10/14/22 1327           Cardiac and Carb modified diet DISCHARGE MEDICATION: Allergies as of 10/14/2022       Reactions   Tramadol Other (See Comments)   nightmares   Codeine Anxiety   Sulfa Antibiotics Itching, Rash        Medication List     TAKE these medications    [employer] Blood Glucose Meter w/Device Kit Use as directed to check sugars daily 250.42   allopurinol 100 MG tablet Commonly known as: ZYLOPRIM Take 1 tablet (100 mg total) by mouth daily. For gout   amLODipine 5 MG tablet Commonly known as: NORVASC Take 1 tablet (5 mg total) by mouth daily.   anastrozole 1 MG tablet Commonly known as: ARIMIDEX Take 1 tablet (1 mg total) by mouth daily.   apixaban 5 MG Tabs tablet Commonly known as: Eliquis Take 1 tablet (5 mg total) by mouth 2 (two) times daily. As blood thinner   carvedilol 25 MG tablet Commonly known as: COREG Take 0.5 tablets (12.5 mg total) by mouth 2 (two) times daily with a meal.   isosorbide dinitrate 20 MG tablet Commonly known as: ISORDIL Take 1 tablet (20 mg total) by mouth  daily. For heart/blood pressure   levothyroxine 88 MCG tablet Commonly known as: SYNTHROID Take 1 tablet (88 mcg total) by mouth daily. For hypothyroidism   linagliptin 5 MG Tabs tablet Commonly known as: Tradjenta Take 1 tablet (5 mg total) by mouth daily.   pantoprazole 40 MG tablet Commonly known as: PROTONIX Take 1 tablet (40 mg total) by mouth daily as needed. For reflux   pentoxifylline 400 MG CR tablet Commonly known as: TRENTAL Take 1 tablet (400 mg total) by mouth daily. For circulation   Vitamin D-3 125 MCG (5000 UT) Tabs Take 1 tablet by mouth daily.        Discharge Exam: Filed Weights   10/13/22 1142 10/13/22  1858  Weight: 81.6 kg 77.8 kg   GEN: NAD SKIN: Warm and dry EYES: No pallor or icterus ENT: MMM CV: RRR PULM: CTA B ABD: soft, ND, NT, +BS CNS: AAO x 3, non focal EXT: No edema or tenderness   Condition at discharge: good  The results of significant diagnostics from this hospitalization (including imaging, microbiology, ancillary and laboratory) are listed below for reference.   Imaging Studies: US Carotid Bilateral (at [facility] and [facility] only)  Result Date: 10/14/2022 CLINICAL DATA:  Left arm weakness Chest pain Hypertension Visual disturbance Diabetes Former tobacco user EXAM: BILATERAL CAROTID DUPLEX ULTRASOUND TECHNIQUE: Wallace Cullens scale imaging, color Doppler and duplex ultrasound were performed of bilateral carotid and vertebral arteries in the neck. COMPARISON:  None available FINDINGS: Criteria: Quantification of carotid stenosis is based on velocity parameters that correlate the residual internal carotid diameter with NASCET-based stenosis levels, using the diameter of the distal internal carotid lumen as the denominator for stenosis measurement. The following velocity measurements were obtained: RIGHT ICA: 42/9 cm/sec CCA: 23/5 cm/sec SYSTOLIC ICA/CCA RATIO:  1.8 ECA: 99 cm/sec LEFT ICA: 106/17 cm/sec CCA: 43/6 cm/sec SYSTOLIC ICA/CCA RATIO:  25 ECA: 200 cm/sec RIGHT CAROTID ARTERY: Moderate calcified plaque of the right carotid bulb. RIGHT VERTEBRAL ARTERY:  Antegrade flow. LEFT CAROTID ARTERY: Moderate calcified plaque of the left carotid bulb. LEFT VERTEBRAL ARTERY:  Antegrade flow. IMPRESSION: Moderate calcified plaque of the carotid bulbs with Doppler measurements indicative of less than 50% stenosis. Electronically Signed   By: Acquanetta Belling M.D.   On: 10/14/2022 06:05   MR BRAIN WO CONTRAST  Result Date: 10/13/2022 CLINICAL DATA:  Initial evaluation for neuro deficit, stroke. EXAM: MRI HEAD WITHOUT CONTRAST TECHNIQUE: Multiplanar, multiecho pulse sequences of the brain and surrounding  structures were obtained without intravenous contrast. COMPARISON:  CT from earlier the same day. FINDINGS: Brain: Diffuse prominence of the CSF containing spaces compatible generalized cerebral atrophy. Patchy and confluent T2/FLAIR hyperintensity involving the periventricular deep white matter both cerebral hemispheres as well as the pons, consistent with chronic microvascular ischemic disease, advanced in nature. Encephalomalacia and gliosis involving the right temporal occipital region, consistent with a chronic ischemic infarct. Few small remote lacunar infarcts present about the bilateral basal ganglia, thalami, pons, and cerebellum. No abnormal foci of restricted diffusion to suggest acute or subacute ischemia. Gray-white matter dish in maintained. No acute intracranial hemorrhage. Few scattered chronic micro hemorrhages noted, likely related to chronic underlying hypertension. Extra-axial dural-based mass overlying the right temporal convexity measures 1.8 x 0.9 cm (series 12, image 11). Associated dural tail noted. Finding consistent with a small meningioma. No significant edema or associated mass effect. No other mass lesion. Mild ventricular prominence related global parenchymal volume loss of hydrocephalus. No extra-axial fluid collection. Pituitary gland and suprasellar region within  normal limits. Vascular: Major intracranial vascular flow voids are maintained. Skull and upper cervical spine: Craniocervical junction within normal limits. Bone marrow signal intensity normal. No scalp soft tissue abnormality. Sinuses/Orbits: Prior bilateral ocular lens replacement. Paranasal sinuses are largely clear. No significant mastoid effusion. Other: None. IMPRESSION: 1. No acute intracranial abnormality. 2. Chronic right temporoccipital infarct. 3. Underlying age-related cerebral atrophy with advanced chronic microvascular ischemic disease, with multiple remote lacunar infarcts as above. 4. 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 No significant associated edema or mass effect. Electronically Signed   By: Rise Mu M.D.   On: 10/13/2022 22:15   CT HUMERUS LEFT WO CONTRAST  Result Date: 10/13/2022 CLINICAL DATA:  Left arm pain EXAM: CT OF THE UPPER LEFT EXTREMITY WITHOUT CONTRAST TECHNIQUE: Multidetector CT imaging of the upper left extremity was performed according to the standard protocol. RADIATION DOSE REDUCTION: This exam was performed according to the departmental dose-optimization program which includes automated exposure control, adjustment of the mA and/or kV according to patient size and/or use of iterative reconstruction technique. COMPARISON:  None Available. FINDINGS: Bones/Joint/Cartilage There is no evidence of acute fracture. There is moderate glenohumeral and AC joint osteoarthritis. Greater tuberosity spurring with intraosseous ganglion formation. No aggressive osseous lesion. Small glenohumeral joint effusion. No elbow joint effusion. Ligaments Suboptimally assessed by CT. Muscles and Tendons No acute myotendinous abnormality by CT. No significant muscle atrophy. Soft tissues No focal fluid collection. There is skin thickening and edematous appearance of the left breast with prominent left breast soft tissue, area measuring 3.8 x 2.4 cm (series 5, image 70), and adjacent small fluid density collection measuring 3.0 x 1.4 cm (series 5, image 77). These findings correlates with findings on recent ultrasound and mammogram, please refer to these reports on 10/09/2022 for recommendations. IMPRESSION: No evidence of acute fracture.  Small glenohumeral joint effusion. Moderate glenohumeral and AC joint osteoarthritis. 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 Left breast findings which correlate with recent mammogram and ultrasound on 10/09/2022, please refer to these reports for recommendations. Electronically Signed   By: Caprice Renshaw M.D.   On: 10/13/2022  15:30   CT Head Wo Contrast  Result Date: 10/13/2022 CLINICAL DATA:  Left arm weakness since this morning. EXAM: CT HEAD WITHOUT CONTRAST CT CERVICAL SPINE WITHOUT CONTRAST TECHNIQUE: Multidetector CT imaging of the head and cervical spine was performed following the standard protocol without intravenous contrast. Multiplanar CT image reconstructions of the cervical spine were also generated. RADIATION DOSE REDUCTION: This exam was performed according to the departmental dose-optimization program which includes automated exposure control, adjustment of the mA and/or kV according to patient size and/or use of iterative reconstruction technique. COMPARISON:  Head CT, 08/17/2017. FINDINGS: CT HEAD FINDINGS Brain: No evidence of acute infarction, hemorrhage, hydrocephalus, extra-axial collection or mass lesion/mass effect. Encephalomalacia extends along the right temporal lobe to the occipital parietal lobe, associated with mild ex vacuo dilation of the atrium and temporal horn of the right lateral ventricle, consistent with an old infarct and stable. Several small basal gangliar lacunar infarcts are noted. There is bilateral patchy white matter hypoattenuation consistent with moderate chronic microvascular ischemic change. Vascular: No hyperdense vessel or unexpected calcification. Skull: Normal. Negative for fracture or focal lesion. Sinuses/Orbits: Globes and orbits are unremarkable. Sinuses are clear. Other: None. CT CERVICAL SPINE FINDINGS Alignment: Normal. Skull base and vertebrae: No acute fracture. No primary bone lesion or focal pathologic process. Soft tissues and spinal canal: No prevertebral fluid or swelling. No visible  canal hematoma. Disc levels: Mild to moderate loss of disc height at C4-C5. Moderate loss of disc height at C5-C6. Moderate loss of disc height it C7-T1. Minor disc bulging. No disc herniation. Facet degenerative changes, greatest on the right at C4-C5 and on the left at C3-C4. Upper  chest: No acute or significant abnormality. Other: None. IMPRESSION: HEAD CT 1. No acute intracranial abnormalities. 2. 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 CERVICAL CT 1. No fracture or acute finding. Electronically Signed   By: Amie Portland M.D.   On: 10/13/2022 14:58   CT Cervical Spine Wo Contrast  Result Date: 10/13/2022 CLINICAL DATA:  Left arm weakness since this morning. EXAM: CT HEAD WITHOUT CONTRAST CT CERVICAL SPINE WITHOUT CONTRAST TECHNIQUE: Multidetector CT imaging of the head and cervical spine was performed following the standard protocol without intravenous contrast. Multiplanar CT image reconstructions of the cervical spine were also generated. RADIATION DOSE REDUCTION: This exam was performed according to the departmental dose-optimization program which includes automated exposure control, adjustment of the mA and/or kV according to patient size and/or use of iterative reconstruction technique. COMPARISON:  Head CT, 08/17/2017. FINDINGS: CT HEAD FINDINGS Brain: No evidence of acute infarction, hemorrhage, hydrocephalus, extra-axial collection or mass lesion/mass effect. Encephalomalacia extends along the right temporal lobe to the occipital parietal lobe, associated with mild ex vacuo dilation of the atrium and temporal horn of the right lateral ventricle, consistent with an old infarct and stable. Several small basal gangliar lacunar infarcts are noted. There is bilateral patchy white matter hypoattenuation consistent with moderate chronic microvascular ischemic change. Vascular: No hyperdense vessel or unexpected calcification. Skull: Normal. Negative for fracture or focal lesion. Sinuses/Orbits: Globes and orbits are unremarkable. Sinuses are clear. Other: None. CT CERVICAL SPINE FINDINGS Alignment: Normal. Skull base and vertebrae: No acute fracture. No primary bone lesion or focal pathologic process. Soft tissues and spinal canal: No  prevertebral fluid or swelling. No visible canal hematoma. Disc levels: Mild to moderate loss of disc height at C4-C5. Moderate loss of disc height at C5-C6. Moderate loss of disc height it C7-T1. Minor disc bulging. No disc herniation. Facet degenerative changes, greatest on the right at C4-C5 and on the left at C3-C4. Upper chest: No acute or significant abnormality. Other: None. IMPRESSION: HEAD CT 1. No acute intracranial abnormalities. 2. 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 CERVICAL CT 1. No fracture or acute finding. Electronically Signed   By: Amie Portland M.D.   On: 10/13/2022 14:58   CT Chest Wo Contrast  Result Date: 10/13/2022 CLINICAL DATA:  Chest pain. EXAM: CT CHEST WITHOUT CONTRAST TECHNIQUE: Multidetector CT imaging of the chest was performed following the standard protocol without IV contrast. RADIATION DOSE REDUCTION: This exam was performed according to the departmental dose-optimization program which includes automated exposure control, adjustment of the mA and/or kV according to patient size and/or use of iterative reconstruction technique. COMPARISON:  None Available. FINDINGS: Cardiovascular: Atherosclerosis of thoracic aorta is noted without aneurysm formation. Normal cardiac size. No pericardial effusion. Mild coronary artery calcifications are noted. Mediastinum/Nodes: No enlarged mediastinal or axillary lymph nodes. Thyroid gland, trachea, and esophagus demonstrate no significant findings. Lungs/Pleura: No pneumothorax or pleural effusion is noted. Minimal emphysematous disease is noted. Minimal bibasilar subsegmental atelectasis is noted. Upper Abdomen: No acute abnormality. Musculoskeletal: No chest wall mass or suspicious bone lesions identified. IMPRESSION: Minimal bibasilar subsegmental atelectasis. No other acute abnormality seen in the chest. Mild coronary artery  calcifications are noted. Aortic Atherosclerosis (ICD10-I70.0) and  Emphysema (ICD10-J43.9). Electronically Signed   By: Lupita Raider M.D.   On: 10/13/2022 14:57   DG Chest 2 View  Result Date: 10/13/2022 CLINICAL DATA:  Chest pain EXAM: CHEST - 2 VIEW COMPARISON:  Chest x-ray dated 04/22/2022. FINDINGS: Heart size and mediastinal contours are within normal limits. Lungs are clear. No pleural effusion or pneumothorax is seen. Chronic elevation of the RIGHT hemidiaphragm. No acute-appearing osseous abnormality is identified. IMPRESSION: No active cardiopulmonary disease. No evidence of pneumonia or pulmonary edema. Electronically Signed   By: Bary Richard M.D.   On: 10/13/2022 12:25   MM 3D DIAGNOSTIC MAMMOGRAM UNILATERAL LEFT BREAST  Result Date: 10/09/2022 CLINICAL DATA:  Recall from screening for left breast asymmetry and skin thickening. Patient has a history of left breast carcinoma, initially treated with lumpectomy and radiation therapy in 2008, with additional left breast carcinoma treated with surgery and MammoSite in 2019. Patient has a history of atrial fibrillation, renal failure previously had a right arm dialysis graft. EXAM: DIGITAL DIAGNOSTIC UNILATERAL LEFT MAMMOGRAM WITH TOMOSYNTHESIS; ULTRASOUND LEFT BREAST LIMITED TECHNIQUE: Left digital diagnostic mammography and breast tomosynthesis was performed.; Targeted ultrasound examination of the left breast was performed. COMPARISON:  Previous exam(s). ACR Breast Density Category b: There are scattered areas of fibroglandular density. FINDINGS: The area of asymmetry in the retroareolar left breast disperses on spot compression imaging consistent with superimposed fibroglandular tissue. There is no defined mass or significant residual asymmetry. Post lumpectomy changes in lateral left breast are stable. There is skin thickening and trabecular thickening that has increased since prior exams. There are no suspicious calcifications. On physical exam, skin is thickened in the periareolar and upper inner left  breast. Breast tissue is firm throughout the lateral aspect the breast with firm mass underneath the lumpectomy scar. Targeted ultrasound is performed, showing focal hypoechoic scar tissue deep to the lumpectomy scar in upper outer left breast at 2:30 o'clock, 11 cm the nipple. An elongated fluid collection is noted at 2 o'clock, 6 cm the nipple, 3.3 cm in long axis. There are no suspicious masses. No enlarged or abnormal left axillary lymph nodes. IMPRESSION: 1. Left breast skin and trabecular thickening that has increased when compared to the 2023 exam. This is suspected to be benign asymmetric edema. However, inflammatory breast carcinoma is excluded. Surgical follow-up recommended. 2. No suspicious left breast mass or persistent asymmetry. RECOMMENDATION: 1. Surgical follow-up for the left breast skin thickening, for consideration of punch biopsy. 2. If punch biopsy is not performed, recommend follow-up diagnostic left breast mammography in 6 months versus follow-up breast MRI without and contrast. I have discussed the findings and recommendations with the patient. If applicable, a reminder letter will be sent to the patient regarding the next appointment. BI-RADS CATEGORY  3: Probably benign. Electronically Signed   By: Amie Portland M.D.   On: 10/09/2022 10:58  [place] LIMITED ULTRASOUND INCLUDING AXILLA LEFT BREAST   Result Date: 10/09/2022 CLINICAL DATA:  Recall from screening for left breast asymmetry and skin thickening. Patient has a history of left breast carcinoma, initially treated with lumpectomy and radiation therapy in 2008, with additional left breast carcinoma treated with surgery and MammoSite in 2019. Patient has a history of atrial fibrillation, renal failure previously had a right arm dialysis graft. EXAM: DIGITAL DIAGNOSTIC UNILATERAL LEFT MAMMOGRAM WITH TOMOSYNTHESIS; ULTRASOUND LEFT BREAST LIMITED TECHNIQUE: Left digital diagnostic mammography and breast tomosynthesis was performed.; Targeted  ultrasound examination of the left breast was  performed. COMPARISON:  Previous exam(s). ACR Breast Density Category b: There are scattered areas of fibroglandular density. FINDINGS: The area of asymmetry in the retroareolar left breast disperses on spot compression imaging consistent with superimposed fibroglandular tissue. There is no defined mass or significant residual asymmetry. Post lumpectomy changes in lateral left breast are stable. There is skin thickening and trabecular thickening that has increased since prior exams. There are no suspicious calcifications. On physical exam, skin is thickened in the periareolar and upper inner left breast. Breast tissue is firm throughout the lateral aspect the breast with firm mass underneath the lumpectomy scar. Targeted ultrasound is performed, showing focal hypoechoic scar tissue deep to the lumpectomy scar in upper outer left breast at 2:30 o'clock, 11 cm the nipple. An elongated fluid collection is noted at 2 o'clock, 6 cm the nipple, 3.3 cm in long axis. There are no suspicious masses. No enlarged or abnormal left axillary lymph nodes. IMPRESSION: 1. Left breast skin and trabecular thickening that has increased when compared to the 2023 exam. This is suspected to be benign asymmetric edema. However, inflammatory breast carcinoma is excluded. Surgical follow-up recommended. 2. No suspicious left breast mass or persistent asymmetry. RECOMMENDATION: 1. Surgical follow-up for the left breast skin thickening, for consideration of punch biopsy. 2. If punch biopsy is not performed, recommend follow-up diagnostic left breast mammography in 6 months versus follow-up breast MRI without and contrast. I have discussed the findings and recommendations with the patient. If applicable, a reminder letter will be sent to the patient regarding the next appointment. BI-RADS CATEGORY  3: Probably benign. Electronically Signed   By: Amie Portland M.D.   On: 10/09/2022 10:58  MM 3D  SCREENING MAMMOGRAM BILATERAL BREAST  Result Date: 10/05/2022 CLINICAL DATA:  Screening. LEFT lumpectomy in 2008 and recurrence in 2019 with subsequent repeat lumpectomy and radiation. EXAM: DIGITAL SCREENING BILATERAL MAMMOGRAM WITH TOMOSYNTHESIS AND CAD TECHNIQUE: Bilateral screening digital craniocaudal and mediolateral oblique mammograms were obtained. Bilateral screening digital breast tomosynthesis was performed. The images were evaluated with computer-aided detection. Best images possible per technologist communication. COMPARISON:  Previous exam(s). ACR Breast Density Category b: There are scattered areas of fibroglandular density. FINDINGS: In the left breast, a possible asymmetry and increased skin thickening warrant further evaluation. In the right breast, no findings suspicious for malignancy. IMPRESSION: Further evaluation is suggested for possible asymmetry and increased skin thickening in the left breast. RECOMMENDATION: Diagnostic mammogram and possibly ultrasound of the left breast. (Code:[id number]) The patient will be contacted regarding the findings, and additional imaging will be scheduled. BI-RADS CATEGORY  0: Incomplete: Need additional imaging evaluation. Electronically Signed   By: Meda Klinefelter M.D.   On: 10/05/2022 10:48    Microbiology: Results for orders placed or performed during the hospital encounter of 04/22/22  SARS Coronavirus 2 by RT PCR (hospital order, performed in [facility] hospital lab) *cepheid single result test* Anterior Nasal Swab     Status: None   Collection Time: 04/22/22  6:39 PM   Specimen: Anterior Nasal Swab  Result Value Ref Range Status   SARS Coronavirus 2 by RT PCR NEGATIVE NEGATIVE Final    Comment: (NOTE) SARS-CoV-2 target nucleic acids are NOT DETECTED.  The SARS-CoV-2 RNA is generally detectable in upper and lower respiratory specimens during the acute phase of infection. The lowest concentration of SARS-CoV-2 viral copies this assay can  detect is 250 copies / mL. A negative result does not preclude SARS-CoV-2 infection and should not be used as the sole  basis for treatment or other patient management decisions.  A negative result may occur with improper specimen collection / handling, submission of specimen other than nasopharyngeal swab, presence of viral mutation(s) within the areas targeted by this assay, and inadequate number of viral copies (<250 copies / mL). A negative result must be combined with clinical observations, patient history, and epidemiological information.  Fact Sheet for Patients:   RoadLapTop.co.za  Fact Sheet for Healthcare Providers: http://kim-miller.com/  This test is not yet approved or  cleared by the [place] FDA and has been authorized for detection and/or diagnosis of SARS-CoV-2 by FDA under an Emergency Use Authorization (EUA).  This EUA will remain in effect (meaning this test can be used) for the duration of the COVID-19 declaration under Section 564(b)(1) of the Act, 21 U.S.C. section 360bbb-3(b)(1), unless the authorization is terminated or revoked sooner.  Performed at [facility], [address] [place], [place] [address]     Labs: CBC: Recent Labs  Lab 10/13/22 1400  WBC 6.0  HGB 10.9*  HCT 35.8*  MCV 99.2  PLT 109*   Basic Metabolic Panel: Recent Labs  Lab 10/13/22 1400 10/14/22 0815  NA 140 138  K 4.5 4.3  CL 112* 113*  CO2 18* 14*  GLUCOSE 104* 98  BUN 46* 43*  CREATININE 5.39* 5.35*  CALCIUM 9.5 9.3   Liver Function Tests: No results for input(s): "AST", "ALT", "ALKPHOS", "BILITOT", "PROT", "ALBUMIN" in the last 168 hours. CBG: Recent Labs  Lab 10/13/22 2151 10/14/22 0744 10/14/22 1144  GLUCAP 131* 104* 155*    Discharge time spent: greater than 30 minutes.  Signed: Lurene Shadow, MD [facility] 10/14/2022

## 2022-10-14 NOTE — Evaluation (Addendum)
Physical Therapy Evaluation Patient Details Name: Alexis Tucker MRN: [id number] DOB: 07/21/1940 Today's Date: 10/14/2022  History of Present Illness  Pt is a 82 yo female with pmh of a-fib, chronic HFpEF, T2DM, htn, CKD sTAGE V, breast cancer s/p lumpectomy, who presents to ED with left arm pain and weakness.  Clinical Impression  Pt presenting A & O x 2 supine in bed and will to participate in PT. She did not show neurological asymmetries in UE function and LUE weakness is likely due more to pain than neurological deficits. BUE is similar between LUE and RUE and she does show decreased strength with LUE flexion and abduction along with pain that radiates from axilla down arm. While pt does have LUE deficit, this has not significantly affected her mobility as she is still able to use SPC to ambulate with some assistance that is needed more for guidance due to her visual impairments than actual physical weakness. PT recommending that pt utilize 2WW instead of SPC at this time due to need for BUE with patient utilizing furniture surfacing while utilizing SPC. PT recommending HHP as pt will benefit from skilled PT to improve left shoulder strength and to decrease left shoulder pain to return to performing ADL tasks pain free to continue to remain independent and to decrease caregiver burden.        Recommendations for follow up therapy are one component of a multi-disciplinary discharge planning process, led by the attending physician.  Recommendations may be updated based on patient status, additional functional criteria and insurance authorization.  Follow Up Recommendations       Assistance Recommended at Discharge PRN  Patient can return home with the following  Assist for transportation;Assistance with cooking/housework    Equipment Recommendations    Recommendations for Other Services       Functional Status Assessment Patient has had a recent decline in their functional status and  demonstrates the ability to make significant improvements in function in a reasonable and predictable amount of time.     Precautions / Restrictions Precautions Precautions: None Restrictions Weight Bearing Restrictions: No      Mobility  Bed Mobility Overal bed mobility: Modified Independent             General bed mobility comments: Extra time for setup and sequencing of activity    Transfers Overall transfer level: Modified independent Equipment used: Straight cane, 1 person hand held assist               General transfer comment: Need intermittent handheld assist, but this was more due to difficulty seeing and guidance with direction.    Ambulation/Gait Ambulation/Gait assistance: Modified independent (Device/Increase time) Gait Distance (Feet): 50 Feet Assistive device: 1 person hand held assist, Straight cane Gait Pattern/deviations: Decreased step length - left, Decreased step length - right, Decreased stride length, Step-to pattern Gait velocity: Decreased velocity        Stairs            Wheelchair Mobility    Modified Rankin (Stroke Patients Only)       Balance Overall balance assessment: Modified Independent                                           Pertinent Vitals/Pain Pain Assessment Pain Assessment: 0-10 Pain Score: 5  Pain Location: Axilla of left upper extremity Pain Descriptors / Indicators: Aching  Pain Intervention(s): Monitored during session    Home Living Family/patient expects to be discharged to:: Private residence   Available Help at Discharge: Family (Daughter) Type of Home: House Home Access: Level entry     Alternate Level Stairs-Number of Steps: 13 Home Layout: Two level Home Equipment: Agricultural consultant (2 wheels);Cane - single point      Prior Function Prior Level of Function : Independent/Modified Independent             Mobility Comments: Uses single point cane at baseline and  2WW ADLs Comments: Independent at baseline with ADLs but needs assistance with IADLs and receives help from daughter     Hand Dominance   Dominant Hand: Left    Extremity/Trunk Assessment   Upper Extremity Assessment Upper Extremity Assessment: LUE deficits/detail LUE Deficits / Details: Decreased strength with shoulder abduction and flexion which were  4-/5 compare to RUE which was 5/5 along with increased pain    Lower Extremity Assessment Lower Extremity Assessment: Overall WFL for tasks assessed       Communication   Communication: No difficulties  Cognition Arousal/Alertness: Awake/alert Behavior During Therapy: WFL for tasks assessed/performed Overall Cognitive Status: Within Functional Limits for tasks assessed                                 General Comments: A & O x 2; needed help recalling day of week but could recall DOB and reason she was in hospital        General Comments General comments (skin integrity, edema, etc.): Legally blind    Exercises     Assessment/Plan    PT Assessment Patient needs continued PT services  PT Problem List Decreased strength;Obesity;Pain       PT Treatment Interventions DME instruction;Therapeutic exercise;Gait training    PT Goals (Current goals can be found in the Care Plan section)  Acute Rehab PT Goals Patient Stated Goal: Pt wants to return home and feel less pain in left shoulder PT Goal Formulation: With patient Time For Goal Achievement: 10/28/22 Potential to Achieve Goals: Good    Frequency Min 2X/week     Co-evaluation               AM-PAC PT "6 Clicks" Mobility  Outcome Measure Help needed turning from your back to your side while in a flat bed without using bedrails?: None Help needed moving from lying on your back to sitting on the side of a flat bed without using bedrails?: None Help needed moving to and from a bed to a chair (including a wheelchair)?: A Little Help needed  standing up from a chair using your arms (e.g., wheelchair or bedside chair)?: None Help needed to walk in hospital room?: A Little Help needed climbing 3-5 steps with a railing? : A Lot 6 Click Score: 20    End of Session Equipment Utilized During Treatment: Gait belt Activity Tolerance: Patient tolerated treatment well Patient left: in chair;with call bell/phone within reach;with nursing/sitter in room Nurse Communication: Mobility status PT Visit Diagnosis: Other symptoms and signs involving the nervous system (Q94.503)    Time: 8882-8003 PT Time Calculation (min) (ACUTE ONLY): 20 min   Charges:   PT Evaluation $PT Eval Low Complexity: 1 Low PT Treatments $Therapeutic Activity: 8-22 mins        Ellin Goodie PT, DPT  10/14/2022, 10:25 AM

## 2022-10-14 NOTE — Plan of Care (Signed)
  Problem: Education: Goal: Ability to describe self-care measures that may prevent or decrease complications (Diabetes Survival Skills Education) will improve Outcome: Progressing   Problem: Coping: Goal: Ability to adjust to condition or change in health will improve Outcome: Progressing   Problem: Fluid Volume: Goal: Ability to maintain a balanced intake and output will improve Outcome: Progressing   Problem: Metabolic: Goal: Ability to maintain appropriate glucose levels will improve Outcome: Progressing   Problem: Nutritional: Goal: Maintenance of adequate nutrition will improve Outcome: Progressing   Problem: Skin Integrity: Goal: Risk for impaired skin integrity will decrease Outcome: Progressing   Problem: Pain Managment: Goal: General experience of comfort will improve Outcome: Progressing   Problem: Safety: Goal: Ability to remain free from injury will improve Outcome: Progressing   Problem: Skin Integrity: Goal: Risk for impaired skin integrity will decrease Outcome: Progressing   Problem: Education: Goal: Knowledge of disease or condition will improve Outcome: Progressing   Problem: Ischemic Stroke/TIA Tissue Perfusion: Goal: Complications of ischemic stroke/TIA will be minimized Outcome: Progressing   Problem: Coping: Goal: Will verbalize positive feelings about self Outcome: Progressing   Problem: Health Behavior/Discharge Planning: Goal: Ability to manage health-related needs will improve Outcome: Progressing   Problem: Self-Care: Goal: Ability to participate in self-care as condition permits will improve Outcome: Progressing   Problem: Nutrition: Goal: Risk of aspiration will decrease Outcome: Progressing

## 2022-10-14 NOTE — Evaluation (Signed)
Speech Language Pathology Evaluation Patient Details Name: Alexis Tucker MRN: [id number] DOB: [birth year] Today's Date: 10/14/2022 Time: 1030-1055 SLP Time Calculation (min) (ACUTE ONLY): 25 min  Problem List:  Patient Active Problem List   Diagnosis Date Noted   Left arm weakness 10/13/2022   Atypical chest pain 10/13/2022   Memory impairment 10/09/2022   Bilateral hearing loss due to cerumen impaction 09/25/2022   Pruritus 09/25/2022   Decreased hearing of both ears 09/25/2022   Acute hypoxemic respiratory failure 04/22/2022   Iron deficiency 02/20/2022   Arteriovenous graft for hemodialysis in place, primary 02/09/2022   General weakness 11/26/2021   Unintentional weight loss 09/19/2021   Secondary hyperparathyroidism of renal origin 06/19/2020   Primary open angle glaucoma (POAG) of both eyes 06/19/2020   Malignant neoplasm of upper-outer quadrant of left breast in female, estrogen receptor positive 12/03/2017   Osteoporosis 09/29/2017   History of arterial ischemic stroke 08/27/2017   Thrombocytopenia 08/27/2017   Anemia due to stage 5 chronic kidney disease, not on chronic dialysis 08/27/2017   Paroxysmal atrial fibrillation    Elevated troponin 05/04/2016   Assistance needed with transportation 07/31/2015   Advanced care planning/counseling discussion 07/27/2015   Lower back pain 03/14/2014   Macular degeneration    Diabetic retinopathy with macular edema 03/03/2014   Medicare annual wellness visit, subsequent 01/07/2013   CKD stage 5 due to type 2 diabetes mellitus 01/06/2013   Carotid stenosis, asymptomatic 12/05/2012   Post-surgical hypothyroidism    OA (osteoarthritis)    Type 2 diabetes mellitus with other specified complication    HTN (hypertension)    History of breast cancer    History of colon cancer    GERD (gastroesophageal reflux disease)    Past Medical History:  Past Medical History:  Diagnosis Date   Anemia    Aortic stenosis 11/16/2021   a.) TTE  11/16/2021: EF 55-60%, mild AS (MPF 3.3 mmHg); AVA (VTI) = 2.52 cm   Atrial fibrillation    a.) CHA2DS2-VASc = 9 (age x 2, sex, CHF, HTN, CVA x2, vascular disease history, T2DM);  b.) rate/rhythm maintained on oral carvedilol; chronically anticoagulated using apixban   Breast cancer, left 09/19/2006   a.) moderately differentiated IMC (G2, T1N0Mx); s/p lumpectomy + adjuvant XRT   Breast cancer, left 11/27/2017   a.) IMC (mpT2 pN0, G2, ER/PR positive, HER-2/neu negative); b.) mammosite   Carotid stenosis, asymptomatic 01/10/2013   a.) carotid dopplers 01/10/2013, 02/27/2014, 09/10/2017, 07/26/2020: >50% BECA, 1-39% BICA   Cataracts, bilateral    Chronic diastolic CHF (congestive heart failure)    a.) TTE 11/26/2007: EF 60%, LVH, triv TR/MR, AoV sclerosis with no stenosis, G1DD; b.) TTE 05/04/2016: EF 60-65%, LAE, G1DD; c.) TTE 11/16/2021: EF 55-60%, LVH, RVE, mild-mod LAE, mod MAC, mod MR, mild TR/AR, G2DD.   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   a.) Avastin injections as [facility]   DJD (degenerative joint disease)    ESRD (end stage renal disease)    GERD (gastroesophageal reflux disease)    Glaucoma 03/2014   History of colon cancer 2006   a.) s/p colectomy   History of hepatitis A 1990s   from food, resolved   HTN (hypertension)    Hyperplastic colon polyp  Long term current use of anticoagulant    a.) apixaban   Long term current use of antithrombotics/antiplatelets    a.) pentoxifylline   Macular degeneration    wet R with vision loss, dry L; to see retina specialist   Neuropathy    OA (osteoarthritis)    Osteonecrosis    hips?   PONV (postoperative nausea and vomiting)    Post-surgical hypothyroidism     Thrombocytopenia    Tubular adenoma    Wears dentures    partial upper   Past Surgical History:  Past Surgical History:  Procedure Laterality Date   A/V FISTULAGRAM Right 06/08/2022   Procedure: A/V Fistulagram;  Surgeon: Annice Needy, MD;  Location: [facility] INVASIVE CV [facility];  Service: Cardiovascular;  Laterality: Right;   ABDOMINAL HYSTERECTOMY  1976   partial, after tubal pregnancy, h/o fibroids with heavy bleeding   AV FISTULA PLACEMENT Right 02/01/2022   Procedure: INSERTION OF ARTERIOVENOUS (AV) GORE-TEX GRAFT ARM (LEFT UPPER EXTREMITY);  Surgeon: Annice Needy, MD;  Location: [facility] ORS;  Service: Vascular;  Laterality: Right;   BREAST BIOPSY Left 2008   positive   BREAST BIOPSY Left 11/22/2017   2oclock 2cmfn wing shaped clip, INVASIVE MAMMARY CARCINOMA   BREAST BIOPSY Left 11/22/2017   2oclock 4cmfn coil shaped clip, INVASIVE MAMMARY CARCINOMA   BREAST BIOPSY Left 11/22/2017   lymph node - butterfly hydroMARK   BREAST EXCISIONAL BIOPSY Left 01/07/2018   lumpectomy by Dr. Lemar Livings   BREAST LUMPECTOMY Left 2008   F/U radiation   BREAST LUMPECTOMY Left 2019   2 areas of [facility] at 2:00 position f/u with mammosite   BREAST LUMPECTOMY WITH SENTINEL LYMPH NODE BIOPSY Left 01/07/2018   Procedure: BREAST LUMPECTOMY WITH SENTINEL LYMPH NODE BX;  Surgeon: Earline Mayotte, MD;  Location: [facility] ORS;  Service: General;  Laterality: Left;   BREAST MAMMOSITE  2008   carotid ultrasound  12/2012   1-39% bilateral stenosis, severe in ECA   CATARACT EXTRACTION W/PHACO Left 08/14/2016   Procedure: CATARACT EXTRACTION PHACO AND INTRAOCULAR LENS PLACEMENT (IOC)  Left Diabetic;  Surgeon: Sherald Hess, MD;  Location: [facility];  Service: Ophthalmology;  Laterality: Left;  Diabetic - oral meds   CHOLECYSTECTOMY  2005   COLECTOMY  2005   colon cancer   COLON SURGERY  2006   bowel obstruction   COLONOSCOPY  2008   ext hem, ileo-colonic anastomosis in cecum, tubular adenoma x1, rec  rpt 1 yr for multiple polyps (in [place])   COLONOSCOPY WITH PROPOFOL N/A 10/19/2021   2.5cm TVA with low grade dysplasia, diverticulosis, no rpt recommended [name], [name], MD)   ESOPHAGOGASTRODUODENOSCOPY N/A 05/06/2016   Procedure: ESOPHAGOGASTRODUODENOSCOPY (EGD);  Surgeon: Scot Jun, MD;  Location: [facility];  Service: Endoscopy;  Laterality: N/A;   EYE SURGERY     JOINT REPLACEMENT     THYROIDECTOMY, PARTIAL     unclear why   TOTAL KNEE ARTHROPLASTY Right 1999   UPPER EXTREMITY ANGIOGRAPHY Right 05/18/2022   Procedure: Upper Extremity Angiography;  Surgeon: Annice Needy, MD;  Location: [facility] INV[facility] CV [facility];  Service: Cardiovascular;  Laterality: Right;   US ECHOCARDIOGRAPHY  11/2007   nl LV fxn EF 60%, diastolic dysfunction, LVH, tr TR and MR, slcerotic aortic valve   VEIN LIGATION AND STRIPPING     HPI:  Pt is a 82 yo female with pmh of a-fib, chronic HFpEF, T2DM, htn, CKD sTAGE V, breast cancer s/p lumpectomy, who presents to ED with left  arm pain and weakness. 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 No significant associated edema or mass effect." Premorbid concerns for memory loss; at recent PCP visit on 10/03/22, Mental Status Exam 15/27 (items omitted due to visual impairment).   Assessment / Plan / Recommendation Clinical Impression  Patient presents with stable cognitive communication impairments consistent with recent mental status exam on 10/03/22. Pt scored 15/27 on that assessment with her PCP. Today, the Mini Mental State Examination (MMSE) was administered, with 3 items omitted due to visual impairments. Pt scored 15/27. A score below 26/30 indicates cognitive impairments. Daughter reports slow decline of cognition noted since 2022; she was  unsure of whether this is related to kidney function vs "something else going on." SLP educated on s/sx of cognitive decline vs normal aging, and recommended neurology follow-up for further evaluation. While pt appears to be at her baseline level of functioning, given chronic changes on MRI and daughter's questions re: possible dementia, recommend neurology follow-up as outpatient. Patient lives with her daughter who is available 24/7 and provides assistance with medications, cooking. No further immediate ST needs are identified, and daughter is able to provide necessary level of supervision for safety with daily activities. Should neuro workup reveal neurodegenerative process, pt may benefit in future from ST for training in compensatory strategies and activities to support safety and independence in functional tasks. Education provided on importance of evaluation and management of hearing loss and daily cognitive activities to maximize and maintain cognitive function. SLP to s/o.     SLP Assessment  SLP Recommendation/Assessment: Patient does not need any further Speech Lanaguage Pathology Services SLP Visit Diagnosis: Cognitive communication deficit (R41.841)    Recommendations for follow up therapy are one component of a multi-disciplinary discharge planning process, led by the attending physician.  Recommendations may be updated based on patient status, additional functional criteria and insurance authorization.    Follow Up Recommendations  No SLP follow up    Assistance Recommended at Discharge  Frequent or constant Supervision/Assistance  Functional Status Assessment  (No functional decline related to cognitive function; baseline deficits appear stable)  Frequency and Duration Other (Comment) (one time visit)  Other (comment) (evaluation only)      SLP Evaluation Cognition  Overall Cognitive Status: History of cognitive impairments - at baseline Arousal/Alertness:  Awake/alert Orientation Level: Oriented to person;Oriented to situation;Oriented to place;Disoriented to time Year: Other (Comment) (unsure) Month: August Day of Week: Incorrect Attention: Sustained Sustained Attention: Impaired Sustained Attention Impairment: Verbal basic Memory: Impaired Memory Impairment: Decreased recall of new information;Decreased short term memory (immediate recall 3/3, delayed recall 2/3) Decreased Short Term Memory: Verbal basic;Functional basic Awareness: Impaired Awareness Impairment: Intellectual impairment Problem Solving: Impaired Problem Solving Impairment: Verbal basic Executive Function:  (impaired due to lower level deficits) Safety/Judgment: Other (comment) (appeared functional during transfer to commode with Nurse Tech)       Comprehension  Auditory Comprehension Overall Auditory Comprehension: Other (comment) (functional, however pt is HOH) Yes/No Questions: Within Functional Limits Commands: Within Functional Limits Conversation: Simple Interfering Components: Hearing;Visual impairments Visual Recognition/Discrimination Discrimination: Not tested (visual impairment) Reading Comprehension Reading Status: Unable to assess (comment) (due to visual impairment)    Expression Expression Primary Mode of Expression: Verbal Verbal Expression Overall Verbal Expression: Appears within functional limits for tasks assessed Initiation: No impairment Automatic Speech: Name;Social Response Level of Generative/Spontaneous  Verbalization: Conversation Repetition: No impairment Naming: No impairment (limited assessment; names 2/2 objects with tactile input (due to visual impairment)) Pragmatics: No impairment Non-Verbal Means of Communication: Not applicable Written Expression Dominant Hand: Left Written Expression: Unable to assess (comment) (visual impairment; pt declines)   Oral / Motor  Oral Motor/Sensory Function Overall Oral Motor/Sensory  Function: Within functional limits Motor Speech Overall Motor Speech: Appears within functional limits for tasks assessed Respiration: Within functional limits Phonation: Normal Resonance: Within functional limits Articulation: Within functional limitis Intelligibility: Intelligible Motor Planning: Witnin functional limits Motor Speech Errors: Not applicable           Rondel Baton, MS, CCC-SLP Speech-Language Pathologist Office: [phone] [facility]: [phone]  Arlana Lindau 10/14/2022, 11:08 AM

## 2022-10-14 NOTE — Evaluation (Addendum)
Occupational Therapy Evaluation Patient Details Name: Alexis Tucker MRN: [id number] DOB: 04/01/1941 Today's Date: 10/14/2022   History of Present Illness Pt is a 82 yo female with pmh of a-fib, chronic HFpEF, T2DM, htn, CKD sTAGE V, breast cancer s/p lumpectomy, who presents to ED with left arm pain and weakness.   Clinical Impression   Patient agreeable to OT evaluation. Pt presenting with decreased independence in self care, balance, functional mobility/transfers, endurance, and safety awareness. PTA pt lived with daughter where she received assistance for ADLs as needed and all IADLs. She is A&Ox3 this date. Pt currently functioning at Min A for LB dressing, simulated toilet transfer, and functional mobility at room level using SPC and L HHA. She c/o pain starting at axilla region and extending down to fingertips, however, limited ability to describe pain. Pt is legally blind, however, endorsed being able to see "outlines". She required multimodal cues for safety and navigating environment 2/2 low vision. Pt with noted LUE deficits in strength and ROM. Pt will benefit from skilled acute OT services to address deficits noted below. OT recommends ongoing therapy upon discharge to maximize safety and independence with ADLs, decrease fall risk, decrease caregiver burden, and promote return to PLOF.     Recommendations for follow up therapy are one component of a multi-disciplinary discharge planning process, led by the attending physician.  Recommendations may be updated based on patient status, additional functional criteria and insurance authorization.   Assistance Recommended at Discharge Frequent or constant Supervision/Assistance  Patient can return home with the following A little help with walking and/or transfers;A little help with bathing/dressing/bathroom;Assistance with feeding;Assistance with cooking/housework;Assist for transportation;Help with stairs or ramp for entrance;Direct  supervision/assist for financial management;Direct supervision/assist for medications management    Functional Status Assessment  Patient has had a recent decline in their functional status and demonstrates the ability to make significant improvements in function in a reasonable and predictable amount of time.  Equipment Recommendations  None recommended by OT    Recommendations for Other Services       Precautions / Restrictions Precautions Precautions: Fall Restrictions Weight Bearing Restrictions: No      Mobility Bed Mobility               General bed mobility comments: NT, received/left in recliner    Transfers Overall transfer level: Needs assistance Equipment used: Straight cane, 1 person hand held assist Transfers: Sit to/from Stand Sit to Stand: Min assist           General transfer comment: VC for hand placement, HOH assist at times to locate arm rest of recliner 2/2 low vision      Balance Overall balance assessment: Needs assistance Sitting-balance support: Feet supported Sitting balance-Leahy Scale: Good     Standing balance support: Bilateral upper extremity supported, Single extremity supported Standing balance-Leahy Scale: Fair         ADL either performed or assessed with clinical judgement   ADL Overall ADL's : Needs assistance/impaired Eating/Feeding: Set up;Sitting                   Lower Body Dressing: Minimal assistance;Sitting/lateral leans Lower Body Dressing Details (indicate cue type and reason): socks  Toilet Transfer: Minimal assistance Toilet Transfer Details (indicate cue type and reason): simulated         Functional mobility during ADLs: Minimal assistance;Cane;Cueing for safety (~26ft, using SPC and HHA for LUE, VC for safety with mobility 2/2 low vision, unsteady)  Vision Baseline Vision/History: 2 Legally blind Ability to See in Adequate Light: 2 Moderately impaired Patient Visual Report: No  change from baseline Additional Comments: Pt endorsed being able to see "outlines" of objects.     Perception     Praxis      Pertinent Vitals/Pain Pain Assessment Pain Assessment: 0-10 Pain Score: 5  Pain Location: LUE Pain Descriptors / Indicators: Aching Pain Intervention(s): Monitored during session, Repositioned     Hand Dominance Left   Extremity/Trunk Assessment Upper Extremity Assessment Upper Extremity Assessment: Generalized weakness;LUE deficits/detail LUE Deficits / Details: Grossly 4-/5, able to make full composite fist, good grip strength, thumb opposition and FTN grossly intact although decreased speed of coordination noted (low vision vs not understanding directions?), shoulder flexion AAROM to ~90 deg. Reported pain starting at axilla and going down to fingertips, but unable to give descriptors of pain. LUE: Shoulder pain with ROM   Lower Extremity Assessment Lower Extremity Assessment: Defer to PT evaluation       Communication Communication Communication: No difficulties   Cognition Arousal/Alertness: Awake/alert Behavior During Therapy: WFL for tasks assessed/performed Overall Cognitive Status: History of cognitive impairments - at baseline         General Comments: A&Ox3 (disoriented to time). Plesant and cooperative, able to give most history, slow processing.     General Comments     Exercises Other Exercises Other Exercises: OT provided education re: role of OT, OT POC, post acute recs, sitting up for all meals, EOB/OOB mobility with assistance, home/fall safety.     Shoulder Instructions      Home Living Family/patient expects to be discharged to:: Private residence Living Arrangements: Children (daughter) Available Help at Discharge: Family;Available 24 hours/day Type of Home: House Home Access: Level entry     Home Layout: Two level Alternate Level Stairs-Number of Steps: 13   Bathroom Shower/Tub: Contractor: Standard Bathroom Accessibility: No   Home Equipment: Agricultural consultant (2 wheels);Cane - single point;Shower seat      Lives With: Daughter    Prior Functioning/Environment Prior Level of Function : Needs assist             Mobility Comments: Mod I using SPC ADLs Comments: Pt reprots daughter provides set up A for dressing, supervision for bathing, and occasionally set up A for self-feeding. Daughter assists with all IADLs (driving, household management, med mgmt).        OT Problem List: Decreased strength;Decreased range of motion;Decreased activity tolerance;Impaired balance (sitting and/or standing);Decreased cognition;Decreased safety awareness;Pain;Obesity; Impaired vision/perception       OT Treatment/Interventions: Self-care/ADL training;Therapeutic exercise;Neuromuscular education;Energy conservation;DME and/or AE instruction;Manual therapy;Modalities;Balance training;Patient/family education;Visual/perceptual remediation/compensation;Cognitive remediation/compensation;Therapeutic activities;Splinting    OT Goals(Current goals can be found in the care plan section) Acute Rehab OT Goals Patient Stated Goal: return home, reduce LUE pain OT Goal Formulation: With patient/family Time For Goal Achievement: 10/28/22 Potential to Achieve Goals: Good   OT Frequency: Min 1X/week    Co-evaluation              AM-PAC OT "6 Clicks" Daily Activity     Outcome Measure Help from another person eating meals?: A Little Help from another person taking care of personal grooming?: A Little Help from another person toileting, which includes using toliet, bedpan, or urinal?: A Little Help from another person bathing (including washing, rinsing, drying)?: A Little Help from another person to put on and taking off regular upper body clothing?: A Little Help from another person  to put on and taking off regular lower body clothing?: A Little 6 Click Score: 18   End of Session  Equipment Utilized During Treatment: Gait belt;Other (comment) [facility]) Nurse Communication: Mobility status  Activity Tolerance: Patient tolerated treatment well Patient left: in chair;with call bell/phone within reach;with chair alarm set;with family/visitor present  OT Visit Diagnosis: Unsteadiness on feet (R26.81);Muscle weakness (generalized) (M62.81);Low vision, both eyes (H54.2) ;Pain Pain - Right/Left: Left Pain - part of body: Arm                Time: 4782-9562 OT Time Calculation (min): 22 min Charges:  OT General Charges $OT Visit: 1 Visit OT Evaluation $OT Eval Low Complexity: 1 Low  Tawnie Ehresman MS, OTR/L ascom [phone]  10/14/22, 1:25 PM

## 2022-10-14 NOTE — TOC Transition Note (Signed)
Transition of Care [facility]) - CM/SW Discharge Note   Patient Details  Name: Alexis Tucker MRN: [id number] Date of Birth: Aug 11, 1940  Transition of Care [facility] CM/SW Contact:  Allena Katz, LCSW Phone Number: 10/14/2022, 1:39 PM   Clinical Narrative:   CSW spoke with patient and her daughter. Pt is declining HH services at this time. Pt has orders to discharge home. Pt and daughter informed if they change their mind they can follow up with PCP.           Patient Goals and CMS Choice      Discharge Placement                         Discharge Plan and Services Additional resources added to the After Visit Summary for                                       Social Determinants of Health (SDOH) Interventions SDOH Screenings   Food Insecurity: No Food Insecurity (10/13/2022)  Housing: Low Risk  (10/13/2022)  Transportation Needs: No Transportation Needs (10/13/2022)  Utilities: Not At Risk (10/13/2022)  Depression (PHQ2-9): Low Risk  (09/25/2022)  Financial Resource Strain: Low Risk  (12/28/2021)  Physical Activity: Inactive (12/28/2021)  Stress: No Stress Concern Present (12/28/2021)  Tobacco Use: Medium Risk (10/14/2022)     Readmission Risk Interventions     No data to display

## 2022-10-16 ENCOUNTER — Telehealth: Payer: Self-pay | Admitting: Cardiology

## 2022-10-16 DIAGNOSIS — I129 Hypertensive chronic kidney disease with stage 1 through stage 4 chronic kidney disease, or unspecified chronic kidney disease: Secondary | ICD-10-CM | POA: Diagnosis not present

## 2022-10-16 DIAGNOSIS — I10 Essential (primary) hypertension: Secondary | ICD-10-CM | POA: Diagnosis not present

## 2022-10-16 DIAGNOSIS — N185 Chronic kidney disease, stage 5: Secondary | ICD-10-CM | POA: Diagnosis not present

## 2022-10-16 DIAGNOSIS — N2581 Secondary hyperparathyroidism of renal origin: Secondary | ICD-10-CM | POA: Diagnosis not present

## 2022-10-16 NOTE — Telephone Encounter (Signed)
Spoke to the pt daughter karen ( on [place]), explained MD recommendations: Keep follow-up appointment as scheduled.    Pt daughter voiced understanding

## 2022-10-16 NOTE — Telephone Encounter (Signed)
Spoke to the patients daughter [name] ( on [place]). Patient was admitted to the hospital on 4/12, she had MRI, lab work, and echo completed. Pt has an scheduled OV for 4/19 and will like to know if the MD can use the lab results from the hospital for the OV. Will forward to MD for advise.

## 2022-10-16 NOTE — Telephone Encounter (Signed)
Pts daughter, [name], states that pt was hospitalized 4/12-4/13 at [facility]. While in the hospital, [name] states that pt had labs, at least 2 EKG's, an echo, a c-xray, an MRI that was all done on 4/12 and pt was d/c'd on 4/13. [name] states that the pt is having a lot of fatigue, and is just very tired and to prevent anymore poking around, if in order to get a refill on the pt's eliquis and carvedilol, if Dr. Azucena Cecil can use those tests. She would like a c/b at [phone].

## 2022-10-20 ENCOUNTER — Ambulatory Visit: Payer: Medicare Other | Attending: Cardiology | Admitting: Cardiology

## 2022-10-23 ENCOUNTER — Encounter: Payer: Self-pay | Admitting: Cardiology

## 2022-10-27 ENCOUNTER — Encounter: Payer: Self-pay | Admitting: Family Medicine

## 2022-10-27 DIAGNOSIS — R928 Other abnormal and inconclusive findings on diagnostic imaging of breast: Secondary | ICD-10-CM

## 2022-10-31 ENCOUNTER — Telehealth: Payer: Self-pay | Admitting: Pharmacist

## 2022-10-31 DIAGNOSIS — E1122 Type 2 diabetes mellitus with diabetic chronic kidney disease: Secondary | ICD-10-CM

## 2022-10-31 DIAGNOSIS — I4891 Unspecified atrial fibrillation: Secondary | ICD-10-CM

## 2022-10-31 DIAGNOSIS — E1169 Type 2 diabetes mellitus with other specified complication: Secondary | ICD-10-CM

## 2022-10-31 NOTE — Telephone Encounter (Signed)
PharmD reviewed patient chart to assess eligibility for [facility] services. Patient was determined to be a good candidate for the program given the complexity of the medication regimen and overall risk for hospitalization and/or high healthcare utilization.   Referral entered in order to outreach patient and offer appointment with PharmD. Referral cosigned to PCP.  

## 2022-10-31 NOTE — Addendum Note (Signed)
Addended by: Kathyrn Sheriff on: 10/31/2022 04:48 PM   Modules accepted: Orders

## 2022-11-01 ENCOUNTER — Encounter: Payer: Self-pay | Admitting: Cardiology

## 2022-11-01 ENCOUNTER — Ambulatory Visit: Payer: Medicare Other | Attending: Cardiology | Admitting: Cardiology

## 2022-11-01 VITALS — BP 158/66 | HR 75 | Ht 63.0 in | Wt 170.1 lb

## 2022-11-01 DIAGNOSIS — I480 Paroxysmal atrial fibrillation: Secondary | ICD-10-CM | POA: Diagnosis not present

## 2022-11-01 DIAGNOSIS — I350 Nonrheumatic aortic (valve) stenosis: Secondary | ICD-10-CM | POA: Diagnosis not present

## 2022-11-01 DIAGNOSIS — I10 Essential (primary) hypertension: Secondary | ICD-10-CM | POA: Diagnosis not present

## 2022-11-01 MED ORDER — CARVEDILOL 25 MG PO TABS: 12.5000 mg | ORAL_TABLET | Freq: Two times a day (BID) | ORAL | 2 refills | Status: DC

## 2022-11-01 MED ORDER — ISOSORBIDE MONONITRATE ER 30 MG PO TB24: 30.0000 mg | ORAL_TABLET | Freq: Every day | ORAL | 3 refills | Status: DC

## 2022-11-01 NOTE — Progress Notes (Signed)
Cardiology Office Note:    Date:  11/01/2022   ID:  Alexis Tucker, Alexis Tucker 29-Jan-1941, MRN [id number]  PCP:  Alexis Boyden, MD   [facility] Providers Cardiologist:  Alexis Odea, MD     Referring MD: Alexis Boyden, MD   Chief Complaint  Patient presents with   6 month follow up     "Doing well." Medications reviewed by the patient verbally.     History of Present Illness:    Alexis Tucker is a 82 y.o. female with a hx of paroxysmal atrial fibrillation, hypertension, diabetes, CKD 5  who presents for follow-up.  She feels well, denies palpitations or dizziness.  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  They have plans to have AV graft placed on day 1.  She is suppose to start hemodialysis in the near future.  States her blood pressure is usually well-controlled at home, takes isosorbide dinitrate daily, has been on this for years now.  Compliant with Eliquis as prescribed, no bleeding issues.   Prior notes Echocardiogram 11/17/2021 EF 55 to 60%, mild aortic valve stenosis.   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   a.) moderately differentiated IMC (G2, T1N0Mx); s/p lumpectomy + adjuvant XRT   Breast cancer, left (HCC) 11/27/2017   a.) IMC (mpT2 pN0, G2, ER/PR positive, HER-2/neu negative); b.) mammosite   Carotid stenosis, asymptomatic 01/10/2013   a.) carotid dopplers 01/10/2013, 02/27/2014, 09/10/2017, 07/26/2020: >50% Alexis Tucker, 1-39% Alexis Tucker   Cataracts, bilateral    Chronic diastolic CHF (congestive heart failure) (HCC)    a.) TTE 11/26/2007: EF 60%, LVH, triv TR/MR, AoV sclerosis  with no stenosis, G1DD; b.) TTE 05/04/2016: EF 60-65%, LAE, G1DD; c.) TTE 11/16/2021: EF 55-60%, LVH, RVE, mild-mod LAE, mod MAC, mod MR, mild TR/AR, G2DD.   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   a.) Avastin injections as Alexis Tucker   DJD (degenerative joint disease)    ESRD (end stage renal disease) (HCC)    GERD (gastroesophageal reflux disease)    Glaucoma 03/2014   History of colon cancer 2006   a.) s/p colectomy   History of hepatitis A 1990s   from food, resolved   HTN (hypertension)    Hyperplastic colon polyp    Long term current use of anticoagulant    a.) apixaban   Long term current use of antithrombotics/antiplatelets    a.) pentoxifylline   Macular degeneration    wet R with vision loss, dry L; to see retina specialist   Neuropathy    OA (osteoarthritis)    Osteonecrosis (HCC)    hips?   PONV (postoperative nausea and vomiting)    Post-surgical hypothyroidism    Thrombocytopenia (HCC)    Tubular adenoma    Wears dentures    partial upper    Past Surgical History:  Procedure Laterality Date   A/V FISTULAGRAM Right 06/08/2022   Procedure: A/V Fistulagram;  Surgeon: Alexis Needy, MD;  Location: [facility] INVASIVE CV LAB;  Service: Cardiovascular;  Laterality: Right;   ABDOMINAL HYSTERECTOMY  1976   partial, after tubal pregnancy, h/o fibroids with heavy bleeding   AV FISTULA PLACEMENT Right 02/01/2022   Procedure: INSERTION OF ARTERIOVENOUS (AV) GORE-TEX GRAFT ARM (LEFT UPPER EXTREMITY);  Surgeon: Alexis Needy, MD;  Location: [facility] ORS;  Service: Vascular;  Laterality: Right;   BREAST BIOPSY Left 2008   positive   BREAST BIOPSY Left 11/22/2017   2oclock 2cmfn wing shaped clip, INVASIVE  MAMMARY CARCINOMA   BREAST BIOPSY Left 11/22/2017   2oclock 4cmfn coil shaped clip, INVASIVE MAMMARY CARCINOMA   BREAST BIOPSY Left 11/22/2017   lymph node - butterfly hydroMARK   BREAST EXCISIONAL BIOPSY Left 01/07/2018   lumpectomy by Dr. Lemar Tucker   BREAST LUMPECTOMY Left 2008   F/U radiation   BREAST LUMPECTOMY Left 2019   2 areas of [facility] at 2:00 position f/u with mammosite   BREAST LUMPECTOMY WITH SENTINEL LYMPH NODE BIOPSY Left 01/07/2018   Procedure: BREAST LUMPECTOMY WITH SENTINEL LYMPH NODE BX;  Surgeon: Alexis Mayotte, MD;  Location: [facility] ORS;  Service: General;  Laterality: Left;   BREAST MAMMOSITE  2008   carotid ultrasound  12/2012   1-39% bilateral stenosis, severe in ECA   CATARACT EXTRACTION W/PHACO Left 08/14/2016   Procedure: CATARACT EXTRACTION PHACO AND INTRAOCULAR LENS PLACEMENT (IOC)  Left Diabetic;  Surgeon: Alexis Hess, MD;  Location: [facility];  Service: Ophthalmology;  Laterality: Left;  Diabetic - oral meds   CHOLECYSTECTOMY  2005   COLECTOMY  2005   colon cancer   COLON SURGERY  2006   bowel obstruction   COLONOSCOPY  2008   ext hem, ileo-colonic anastomosis in cecum, tubular adenoma x1, rec rpt 1 yr for multiple polyps (in [place])   COLONOSCOPY WITH PROPOFOL N/A 10/19/2021   2.5cm TVA with low grade dysplasia, diverticulosis, no rpt recommended Alexis Tucker, Alexis Salina, MD)   ESOPHAGOGASTRODUODENOSCOPY N/A 05/06/2016   Procedure: ESOPHAGOGASTRODUODENOSCOPY (EGD);  Surgeon: Scot Jun, MD;  Location: [facility];  Service: Endoscopy;  Laterality: N/A;   EYE SURGERY     JOINT REPLACEMENT     THYROIDECTOMY, PARTIAL     unclear why   TOTAL KNEE ARTHROPLASTY Right 1999   UPPER EXTREMITY ANGIOGRAPHY Right 05/18/2022   Procedure: Upper Extremity Angiography;  Surgeon: Alexis Needy, MD;  Location: [facility] INVASIVE CV LAB;  Service: Cardiovascular;  Laterality: Right;   US ECHOCARDIOGRAPHY  11/2007   nl LV fxn EF 60%, diastolic dysfunction, LVH,  tr TR and MR, slcerotic aortic valve   VEIN LIGATION AND STRIPPING      Current Medications: Current Meds  Medication Sig   allopurinol (ZYLOPRIM) 100 MG tablet Take 1 tablet (100 mg total) by mouth daily. For gout   amLODipine (NORVASC) 5 MG tablet Take 1 tablet (5 mg total) by mouth daily.   anastrozole (ARIMIDEX) 1 MG tablet Take 1 tablet (1 mg total) by mouth daily.   apixaban (ELIQUIS) 5 MG TABS tablet Take 1 tablet (5 mg total) by mouth 2 (two) times daily. As blood thinner   Blood Glucose Monitoring Suppl (ACURA BLOOD GLUCOSE METER) W/DEVICE KIT Use as directed to check sugars daily 250.42   Cholecalciferol (VITAMIN D-3) 5000 UNITS TABS Take 1 tablet by mouth daily.   isosorbide mononitrate (IMDUR) 30 MG 24 hr tablet Take 1 tablet (  30 mg total) by mouth daily.   levothyroxine (SYNTHROID) 88 MCG tablet Take 1 tablet (88 mcg total) by mouth daily. For hypothyroidism   linagliptin (TRADJENTA) 5 MG TABS tablet Take 1 tablet (5 mg total) by mouth daily.   pantoprazole (PROTONIX) 40 MG tablet Take 1 tablet (40 mg total) by mouth daily as needed. For reflux   pentoxifylline (TRENTAL) 400 MG CR tablet Take 1 tablet (400 mg total) by mouth daily. For circulation   [DISCONTINUED] carvedilol (COREG) 25 MG tablet Take 0.5 tablets (12.5 mg total) by mouth 2 (two) times daily with a meal.   [DISCONTINUED] isosorbide dinitrate (ISORDIL) 20 MG tablet Take 1 tablet (20 mg total) by mouth daily. For heart/blood pressure     Allergies:   Tramadol, Codeine, and Sulfa antibiotics   Social History   Socioeconomic History   Marital status: Divorced    Spouse name: Not on file   Number of children: 1   Years of education: Not on file   Highest education level: Not on file  Occupational History   Not on file  Tobacco Use   Smoking status: Former   Smokeless tobacco: Never   Tobacco comments:    quit over 40 yrs ago  Vaping Use   Vaping Use: Never used  Substance and Sexual Activity   Alcohol  use: Never    Comment: rare   Drug use: Never   Sexual activity: Not Currently  Other Topics Concern   Not on file  Social History Narrative   Lives alone,   Granddaughter in [place] (attorney).   Divorced   Occu: retired, worked in Consulting civil engineer   Edu: college   Has 1 living daughter. (One passed)   Social Determinants of Health   Financial Resource Strain: Low Risk  (12/28/2021)   Overall Financial Resource Strain (CARDIA)    Difficulty of Paying Living Expenses: Not hard at all  Food Insecurity: No Food Insecurity (10/13/2022)   Hunger Vital Sign    Worried About Running Out of Food in the Last Year: Never true    Ran Out of Food in the Last Year: Never true  Transportation Needs: No Transportation Needs (10/13/2022)   PRAPARE - Administrator, Civil Service (Medical): No    Lack of Transportation (Non-Medical): No  Physical Activity: Inactive (12/28/2021)   Exercise Vital Sign    Days of Exercise per Week: 0 days    Minutes of Exercise per Session: 0 min  Stress: No Stress Concern Present (12/28/2021)   [employer] of Occupational Health - Occupational Stress Questionnaire    Feeling of Stress : Not at all  Social Connections: Not on file     Family History: The patient's family history includes Alcohol abuse in her father; Arthritis in her father and mother; Diabetes in her mother; Hypertension in her mother; Stroke in her daughter. There is no history of CAD, Cancer, or Breast cancer.  ROS:   Please see the history of present illness.     All other systems reviewed and are negative.  EKGs/Labs/Other Studies Reviewed:    The following studies were reviewed today:   EKG:  EKG is  ordered today.  The ekg ordered today demonstrates normal sinus rhythm, heart rate 75  Recent Labs: 11/15/2021: Magnesium 1.7 04/22/2022: B Natriuretic Peptide 690.4 09/25/2022: ALT 9; TSH 1.82 10/13/2022: Hemoglobin 10.9; Platelets 109 10/14/2022: BUN 43; Creatinine, Ser 5.35; Potassium  4.3; Sodium 138  Recent Lipid Panel    Component Value  Date/Time   CHOL 195 09/25/2022 1328   TRIG 90.0 09/25/2022 1328   TRIG 97 12/04/2009 0000   HDL 63.10 09/25/2022 1328   CHOLHDL 3 09/25/2022 1328   VLDL 18.0 09/25/2022 1328   LDLCALC 114 (H) 09/25/2022 1328   LDLDIRECT 108 (H) 03/13/2014 1624     Risk Assessment/Calculations:          Physical Exam:    VS:  BP (!) 158/66 (BP Location: Left Arm, Patient Position: Sitting, Cuff Size: Normal)   Pulse 75   Ht 5\' 3"  (1.6 m)   Wt 170 lb 2 oz (77.2 kg)   SpO2 96%   BMI 30.14 kg/m     Wt Readings from Last 3 Encounters:  11/01/22 170 lb 2 oz (77.2 kg)  10/13/22 171 lb 8.3 oz (77.8 kg)  10/03/22 168 lb 4 oz (76.3 kg)     GEN:  Well nourished, well developed in no acute distress HEENT: Normal NECK: No JVD; No carotid bruits CARDIAC: RRR, faint systolic murmur RESPIRATORY:  Clear to auscultation without rales, wheezing or rhonchi  ABDOMEN: Soft, non-tender, non-distended MUSCULOSKELETAL:  No edema; No deformity  SKIN: Warm and dry NEUROLOGIC:  Alert and oriented x 3 PSYCHIATRIC:  Normal affect   ASSESSMENT:    1. Paroxysmal atrial fibrillation (HCC)   2. Primary hypertension   3. Nonrheumatic aortic valve stenosis    PLAN:    In order of problems listed above:  Paroxysmal atrial fibrillation, denies palpitations or dizziness.  Continue Coreg, Eliquis.  EF 55-60%. Hypertension, BP elevated, usually controlled, start Imdur 30 mg daily, stop isordil. Continue  Norvasc 5 mg daily,  Coreg 12.5 mg bid.  Patient starting dialysis soon. Mild aortic valve stenosis, last echo 10/2021. monitor serially with echocardiograms.  Follow-up 12 months.     Medication Adjustments/Labs and Tests Ordered: Current medicines are reviewed at length with the patient today.  Concerns regarding medicines are outlined above.  Orders Placed This Encounter  Procedures   EKG 12-Lead   Meds ordered this encounter  Medications    isosorbide mononitrate (IMDUR) 30 MG 24 hr tablet    Sig: Take 1 tablet (30 mg total) by mouth daily.    Dispense:  90 tablet    Refill:  3   carvedilol (COREG) 25 MG tablet    Sig: Take 0.5 tablets (12.5 mg total) by mouth 2 (two) times daily with a meal.    Dispense:  30 tablet    Refill:  2    Patient Instructions  Medication Instructions:   STOP isosorbide dinitrate (ISORDIL) 20 MG tablet  START Imdur - take one tablet ( 30 mg) by mouth daily.   *If you need a refill on your cardiac medications before your next appointment, please call your pharmacy*   Lab Work:  None Ordered  If you have labs (blood work) drawn today and your tests are completely normal, you will receive your results only by: MyChart Message (if you have MyChart) OR A paper copy in the mail If you have any lab test that is abnormal or we need to change your treatment, we will call you to review the results.   Testing/Procedures:  None Ordered    Follow-Up: At [facility], you and your health needs are our priority.  As part of our continuing mission to provide you with exceptional heart care, we have created designated Provider Care Teams.  These Care Teams include your primary Cardiologist (physician) and Advanced Practice Providers (APPs -  Physician Assistants and Nurse Practitioners) who all work together to provide you with the care you need, when you need it.  We recommend signing up for the patient portal called "MyChart".  Sign up information is provided on this After Visit Summary.  MyChart is used to connect with patients for Virtual Visits (Telemedicine).  Patients are able to view lab/test results, encounter notes, upcoming appointments, etc.  Non-urgent messages can be sent to your provider as well.   To learn more about what you can do with MyChart, go to [email or web address].    Your next appointment:   12 month(s)  Provider:   You may see Alexis Odea, MD or one of  the following Advanced Practice Providers on your designated Care Team:   Nicolasa Ducking, NP Eula Listen, PA-C Cadence Fransico Michael, PA-C Charlsie Quest, NP    Signed, Alexis Odea, MD  11/01/2022 10:11 AM    [facility]

## 2022-11-01 NOTE — Patient Instructions (Signed)
Medication Instructions:   STOP isosorbide dinitrate (ISORDIL) 20 MG tablet  START Imdur - take one tablet ( 30 mg) by mouth daily.   *If you need a refill on your cardiac medications before your next appointment, please call your pharmacy*   Lab Work:  None Ordered  If you have labs (blood work) drawn today and your tests are completely normal, you will receive your results only by: MyChart Message (if you have MyChart) OR A paper copy in the mail If you have any lab test that is abnormal or we need to change your treatment, we will call you to review the results.   Testing/Procedures:  None Ordered    Follow-Up: At [facility], you and your health needs are our priority.  As part of our continuing mission to provide you with exceptional heart care, we have created designated Provider Care Teams.  These Care Teams include your primary Cardiologist (physician) and Advanced Practice Providers (APPs -  Physician Assistants and Nurse Practitioners) who all work together to provide you with the care you need, when you need it.  We recommend signing up for the patient portal called "MyChart".  Sign up information is provided on this After Visit Summary.  MyChart is used to connect with patients for Virtual Visits (Telemedicine).  Patients are able to view lab/test results, encounter notes, upcoming appointments, etc.  Non-urgent messages can be sent to your provider as well.   To learn more about what you can do with MyChart, go to [email or web address].    Your next appointment:   12 month(s)  Provider:   You may see Debbe Odea, MD or one of the following Advanced Practice Providers on your designated Care Team:   Nicolasa Ducking, NP Eula Listen, PA-C Cadence Fransico Michael, PA-C Charlsie Quest, NP

## 2022-11-06 ENCOUNTER — Ambulatory Visit: Payer: Medicare Other | Admitting: Surgery

## 2022-11-06 DIAGNOSIS — E113312 Type 2 diabetes mellitus with moderate nonproliferative diabetic retinopathy with macular edema, left eye: Secondary | ICD-10-CM | POA: Diagnosis not present

## 2022-11-08 ENCOUNTER — Telehealth: Payer: Self-pay

## 2022-11-08 DIAGNOSIS — N185 Chronic kidney disease, stage 5: Secondary | ICD-10-CM | POA: Diagnosis not present

## 2022-11-08 DIAGNOSIS — I129 Hypertensive chronic kidney disease with stage 1 through stage 4 chronic kidney disease, or unspecified chronic kidney disease: Secondary | ICD-10-CM | POA: Diagnosis not present

## 2022-11-08 DIAGNOSIS — E1122 Type 2 diabetes mellitus with diabetic chronic kidney disease: Secondary | ICD-10-CM | POA: Diagnosis not present

## 2022-11-08 DIAGNOSIS — E875 Hyperkalemia: Secondary | ICD-10-CM | POA: Diagnosis not present

## 2022-11-08 DIAGNOSIS — D631 Anemia in chronic kidney disease: Secondary | ICD-10-CM | POA: Diagnosis not present

## 2022-11-08 DIAGNOSIS — I10 Essential (primary) hypertension: Secondary | ICD-10-CM | POA: Diagnosis not present

## 2022-11-08 DIAGNOSIS — R600 Localized edema: Secondary | ICD-10-CM | POA: Diagnosis not present

## 2022-11-08 DIAGNOSIS — N2581 Secondary hyperparathyroidism of renal origin: Secondary | ICD-10-CM | POA: Diagnosis not present

## 2022-11-08 NOTE — Progress Notes (Addendum)
Care Management & Coordination Services Pharmacy Team  Reason for Encounter: Appointment Reminder  Contacted patient to confirm in office appointment with Al Corpus , PharmD on 11/21/22 at 9:00. Unsuccessful outreach. Left voicemail for patient to return call. Reminded location of [facility] [address] 11/09/22 Patient's daughter left voicemail confirming the office appointment 11/13/22- appointment rescheduled several times    Have you seen any other providers since your last visit with PCP? Yes   Cardiology   A1c  6.1  Chart review:  Recent office visits:  10/03/22-Javier Gutierrez,MD(PCP)-AWV, discussed screenings,vaccines, reviewed hospital labs, no medication changes, f/u 9 months 09/25/22-Javier Gutierrez,MD(PCP)- hearing loss,referral for ENT  Recent consult visits:  11/14/22-Harmeet Singh,MD(nephro)-f/u CKD,weight up 4 lbs.labs,recommend restart sodium bicarb. F/u 6 weeks 11/10/22-Jose Piscoya,MD(gen surg)- f/u mammogram results,consider punch biopsy,stop eliquis 2 days prior. 11/01/22-Brian Agbor-Etang,MD-f/u Afib,EKG, start Imdur 30 mg daily, stop isordil. Patient starting dialysis soon. F/u 12 months 10/16/22-Harmeet Singh,MD(nephro)-f/u CKD,restart sodium bicarbonate,f/u 4 weeks 07/31/22-Harmeet Singh,MD(nephro)-f/u CKD,no changes, f/u 2 months  1/5/24Barbara Cower Dew,MD(vas surg)- f/u,right arm steal syndrome and hematoma at the antecubital area. Both are markedly improved after performing coil embolization to her AV graft.  Hospital visits:  10/13/22 - 10/14/22- [facility]   left arm weakness- no admission  06/08/22- [facility]- fistulagram procedure - no admission  05/18/22- [facility]- upper extremity angiogram - no admission    Star Rating Drugs:  Medication:  Last Fill: Day Supply Tradjenta 5mg   10/29/22 30   Care Gaps: Annual wellness visit in last year? Yes  If Diabetic: Last eye exam / retinopathy screening:2019 Last diabetic foot exam: never done   Al Corpus, PharmD notified  Burt Knack, [facility] Clinical Pharmacy Assistant [phone]

## 2022-11-10 ENCOUNTER — Ambulatory Visit (INDEPENDENT_AMBULATORY_CARE_PROVIDER_SITE_OTHER): Payer: Medicare Other | Admitting: Surgery

## 2022-11-10 ENCOUNTER — Encounter: Payer: Self-pay | Admitting: Surgery

## 2022-11-10 VITALS — BP 193/75 | HR 74 | Temp 98.4°F | Ht 63.0 in | Wt 169.6 lb

## 2022-11-10 DIAGNOSIS — Z170 Estrogen receptor positive status [ER+]: Secondary | ICD-10-CM

## 2022-11-10 DIAGNOSIS — N6459 Other signs and symptoms in breast: Secondary | ICD-10-CM | POA: Diagnosis not present

## 2022-11-10 NOTE — Patient Instructions (Addendum)
Please stop Eliquis 2 days prior to procedure.   If you have any concerns or questions, please feel free to call our office. See follow up appointment below.   Skin Biopsy A skin biopsy is a procedure to remove a sample of your skin so that it can be checked for any disease. You may need a skin biopsy if you have a skin disease or abnormal changes on your skin (lesion). Tell a health care provider about: Any allergies you have. All medicines you are taking, including vitamins, herbs, eye drops, creams, and over-the-counter medicines. Any problems you or family members have had with anesthetic medicines. Any bleeding problems you have. Any surgeries you have had. Any medical conditions you have. Whether you are pregnant or may be pregnant. What are the risks? Generally, this is a safe procedure. However, problems may occur, including: Bleeding. Infection. Scarring. Allergic reaction to anesthetics, surgical materials, or ointments. What happens before the procedure? Medicines Ask your health care provider about: Changing or stopping your regular medicines. This is especially important if you are taking diabetes medicines or blood thinners. Taking medicines such as aspirin and ibuprofen. These medicines can thin your blood. Do not take these medicines unless your health care provider tells you to take them. Taking over-the-counter medicines, vitamins, herbs, and supplements. General instructions Follow instructions from your health care provider about eating or drinking restrictions. Ask your health care provider: How your surgery site will be marked. What steps will be taken to help prevent infection. These steps may include: Removing hair at the surgery site. Washing skin with a germ-killing soap. Taking antibiotic medicine. Ask your health care provider if you will need someone to take you home from the hospital or clinic after the procedure. What happens during the  procedure?  You may be given medicine to numb the area (local anesthetic). Your health care provider will take a sample using one of these steps, depending on the type of skin problem that you have: Shave biopsy. Your health care provider will shave away layers of your skin lesion with a sharp blade. After shaving, a gel or ointment may be used to control bleeding. Punch biopsy. Your health care provider will use a tool to remove all or part of the lesion. This leaves a small hole about the width of a pencil eraser. The area may be covered with a gel or ointment. Excisional or incisional biopsy. Your health care provider will use a surgical blade to remove all or part of your lesion. Your skin biopsy site may be closed with stitches (sutures). A bandage (dressing) will be applied. The procedure may vary among health care providers and hospitals. What happens after the procedure? Your skin sample will be sent to a lab for tests. Your skin biopsy site will be watched to make sure that it stops bleeding. You will be given instructions on how to care for your biopsy site. It is up to you to get the results of your procedure. Ask your health care provider, or the department that is doing the procedure, when your results will be ready. Summary A skin biopsy is a procedure to remove a sample of your skin (lesion) so that it can be checked under a microscope. Tell a health care provider about your medical history and all medicines you are taking, including vitamins, herbs, eye drops, creams, and over-the-counter medicines. Before the procedure, ask your health care provider about changing or stopping your regular medicines. During the procedure, your health  care provider will take a skin sample from the area where you have the skin problem. After the procedure, your skin sample will be sent to a laboratory for testing. This information is not intended to replace advice given to you by your health care  provider. Make sure you discuss any questions you have with your health care provider. Document Revised: 01/18/2021 Document Reviewed: 01/18/2021 Elsevier Patient Education  [place].

## 2022-11-10 NOTE — Progress Notes (Signed)
11/10/2022  History of Present Illness: Alexis Tucker is a 82 y.o. female status post left breast lumpectomy and sentinel lymph node biopsy on 01/07/2018 with Dr. Lemar Livings.  She presents today because of abnormal finding on her most recent mammogram.  She had a screening mammogram which was done on 10/04/22, and it showed an area of possible asymmetry in the left breast as well as increased skin thickening in the left breast.  She had diagnostic mammogram and ultrasound of the left breast on 10/09/2022 which confirmed the skin thickening but showed that the asymmetry was just superimposed fibroglandular tissue.  Imaging also showed a heavy scar that she has on the left lateral breast which is the site of her lumpectomy as well as a very small flat fluid collection which is likely postoperative seroma that has remained since.  The area of thickening is in the periareolar region as well as the upper inner quadrant.    The patient overall denies any new symptoms.  She was unaware of any issues.  Particularly denies any left-sided breast pain, any palpable masses, any drainage, or any changes to the skin.  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   a.) Avastin injections as [facility]   DJD (degenerative joint disease)    ESRD (end stage renal disease) (HCC)    GERD (gastroesophageal reflux disease)    Glaucoma 03/2014   History of colon cancer 2006   a.) s/p colectomy   History of hepatitis A 1990s   from food, resolved   HTN (hypertension)    Hyperplastic colon polyp    Long term current use of anticoagulant    a.) apixaban   Long term current use of antithrombotics/antiplatelets    a.) pentoxifylline   Macular degeneration    wet R with vision loss, dry L; to see retina specialist   Neuropathy    OA (osteoarthritis)    Osteonecrosis (HCC)    hips?   PONV (postoperative nausea and vomiting)    Post-surgical hypothyroidism    Thrombocytopenia (HCC)    Tubular adenoma    Wears dentures    partial upper  Past Surgical History: Past Surgical History:  Procedure Laterality Date   A/V FISTULAGRAM Right 06/08/2022   Procedure: A/V Fistulagram;  Surgeon: Annice Needy, MD;  Location: [facility];  Service: Cardiovascular;  Laterality: Right;   ABDOMINAL HYSTERECTOMY  1976   partial, after tubal pregnancy, h/o fibroids with heavy bleeding   AV FISTULA PLACEMENT Right 02/01/2022   Procedure: INSERTION OF ARTERIOVENOUS (AV) GORE-TEX GRAFT ARM (LEFT UPPER EXTREMITY);  Surgeon: Annice Needy, MD;  Location: [facility] ORS;  Service: Vascular;  Laterality: Right;    BREAST BIOPSY Left 2008   positive   BREAST BIOPSY Left 11/22/2017   2oclock 2cmfn wing shaped clip, INVASIVE MAMMARY CARCINOMA   BREAST BIOPSY Left 11/22/2017   2oclock 4cmfn coil shaped clip, INVASIVE MAMMARY CARCINOMA   BREAST BIOPSY Left 11/22/2017   lymph node - butterfly hydroMARK   BREAST EXCISIONAL BIOPSY Left 01/07/2018   lumpectomy by Dr. Lemar Livings   BREAST LUMPECTOMY Left 2008   F/U radiation   BREAST LUMPECTOMY Left 2019   2 areas of [facility] at 2:00 position f/u with mammosite   BREAST LUMPECTOMY WITH SENTINEL LYMPH NODE BIOPSY Left 01/07/2018   Procedure: BREAST LUMPECTOMY WITH SENTINEL LYMPH NODE BX;  Surgeon: Earline Mayotte, MD;  Location: [facility] ORS;  Service: General;  Laterality: Left;   BREAST MAMMOSITE  2008   carotid ultrasound  12/2012   1-39% bilateral stenosis, severe in ECA   CATARACT EXTRACTION W/PHACO Left 08/14/2016   Procedure: CATARACT EXTRACTION PHACO AND INTRAOCULAR LENS PLACEMENT (IOC)  Left Diabetic;  Surgeon: Sherald Hess, MD;  Location: [facility];  Service: Ophthalmology;  Laterality: Left;  Diabetic - oral meds   CHOLECYSTECTOMY  2005   COLECTOMY  2005   colon cancer   COLON SURGERY  2006   bowel obstruction   COLONOSCOPY  2008   ext hem, ileo-colonic anastomosis in cecum, tubular adenoma x1, rec rpt 1 yr for multiple polyps (in [place])   COLONOSCOPY WITH PROPOFOL N/A 10/19/2021   2.5cm TVA with low grade dysplasia, diverticulosis, no rpt recommended [name], [name], MD)   ESOPHAGOGASTRODUODENOSCOPY N/A 05/06/2016   Procedure: ESOPHAGOGASTRODUODENOSCOPY (EGD);  Surgeon: Scot Jun, MD;  [facility];  Service: Endoscopy;  Laterality: N/A;   EYE SURGERY     JOINT REPLACEMENT     THYROIDECTOMY, PARTIAL     unclear why   TOTAL KNEE ARTHROPLASTY Right 1999   UPPER EXTREMITY ANGIOGRAPHY Right 05/18/2022   Procedure: Upper Extremity Angiography;  Surgeon: Annice Needy, MD;  Location: [facility];  Service:  Cardiovascular;  Laterality: Right;   US ECHOCARDIOGRAPHY  11/2007   nl LV fxn EF 60%, diastolic dysfunction, LVH, tr TR and MR, slcerotic aortic valve   VEIN LIGATION AND STRIPPING      Home Medications: Prior to Admission medications   Medication Sig Start Date End Date Taking? Authorizing Provider  allopurinol (ZYLOPRIM) 100 MG tablet Take 1 tablet (100 mg total) by mouth daily. For gout 10/03/22  Yes Eustaquio Boyden, MD  amLODipine (NORVASC) 5 MG tablet Take 1 tablet (5 mg total) by mouth daily. 06/06/22 06/06/23 Yes Agbor-Etang, Arlys John, MD  anastrozole (ARIMIDEX) 1 MG tablet Take 1 tablet (1 mg total) by mouth daily. 02/20/22  Yes Earna Coder, MD  apixaban (ELIQUIS) 5 MG TABS tablet Take 1 tablet (5 mg total) by mouth 2 (two) times daily. As blood thinner 10/03/22  Yes Eustaquio Boyden, MD  Blood Glucose Monitoring Suppl [facility] BLOOD GLUCOSE METER)  W/DEVICE KIT Use as directed to check sugars daily 250.42 03/13/14  Yes Eustaquio Boyden, MD  carvedilol (COREG) 25 MG tablet Take 0.5 tablets (12.5 mg total) by mouth 2 (two) times daily with a meal. 11/01/22  Yes Agbor-Etang, Arlys John, MD  Cholecalciferol (VITAMIN D-3) 5000 UNITS TABS Take 1 tablet by mouth daily.   Yes [provider]  isosorbide mononitrate (IMDUR) 30 MG 24 hr tablet Take 1 tablet (30 mg total) by mouth daily. 11/01/22 01/30/23 Yes Agbor-Etang, Arlys John, MD  levothyroxine (SYNTHROID) 88 MCG tablet Take 1 tablet (88 mcg total) by mouth daily. For hypothyroidism 10/03/22  Yes Eustaquio Boyden, MD  linagliptin (TRADJENTA) 5 MG TABS tablet Take 1 tablet (5 mg total) by mouth daily. 07/10/22  Yes Eustaquio Boyden, MD  pantoprazole (PROTONIX) 40 MG tablet Take 1 tablet (40 mg total) by mouth daily as needed. For reflux 10/03/22  Yes Eustaquio Boyden, MD  pentoxifylline (TRENTAL) 400 MG CR tablet Take 1 tablet (400 mg total) by mouth daily. For circulation 10/03/22  Yes Eustaquio Boyden, MD    Allergies: Allergies  Allergen  Reactions   Tramadol Other (See Comments)    nightmares   Codeine Anxiety   Sulfa Antibiotics Itching and Rash    Review of Systems: Review of Systems  Constitutional:  Negative for chills and fever.  Respiratory:  Negative for shortness of breath.   Cardiovascular:  Negative for chest pain.  Gastrointestinal:  Negative for nausea and vomiting.  Skin:  Negative for rash.    Physical Exam BP (!) 193/75   Pulse 74   Temp 98.4 F (36.9 C) (Oral)   Ht 5\' 3"  (1.6 m)   Wt 169 lb 9.6 oz (76.9 kg)   SpO2 100%   BMI 30.04 kg/m  CONSTITUTIONAL: No acute distress, well-nourished HEENT:  Normocephalic, atraumatic, extraocular motion intact. RESPIRATORY:  Normal respiratory effort without pathologic use of accessory muscles. CARDIOVASCULAR: Regular rhythm and rate. BREAST: Left breast has a palpable firm area in the 3 o'clock position laterally consistent with a lumpectomy site and residual scar.  Otherwise there are no palpable masses throughout the left breast.  The nipple and areolar complex are noted to be swollen without any erythema or masses noted underneath.  Left axilla without any lymphadenopathy.  Right breast without any palpable masses, skin changes, or nipple changes.  No right axillary lymphadenopathy. NEUROLOGIC:  Motor and sensation is grossly normal.  Cranial nerves are grossly intact. PSYCH:  Alert and oriented to person, place and time. Affect is normal.  Labs/Imaging: Mammogram and ultrasound on 10/09/2022: FINDINGS: The area of asymmetry in the retroareolar left breast disperses on spot compression imaging consistent with superimposed fibroglandular tissue. There is no defined mass or significant residual asymmetry.   Post lumpectomy changes in lateral left breast are stable.   There is skin thickening and trabecular thickening that has increased since prior exams.   There are no suspicious calcifications.   On physical exam, skin is thickened in the periareolar  and upper inner left breast. Breast tissue is firm throughout the lateral aspect the breast with firm mass underneath the lumpectomy scar.   Targeted ultrasound is performed, showing focal hypoechoic scar tissue deep to the lumpectomy scar in upper outer left breast at 2:30 o'clock, 11 cm the nipple. An elongated fluid collection is noted at 2 o'clock, 6 cm the nipple, 3.3 cm in long axis. There are no suspicious masses. No enlarged or abnormal left axillary lymph nodes.   IMPRESSION: 1. Left breast  skin and trabecular thickening that has increased when compared to the 2023 exam. This is suspected to be benign asymmetric edema. However, inflammatory breast carcinoma is excluded. Surgical follow-up recommended. 2. No suspicious left breast mass or persistent asymmetry.   RECOMMENDATION: 1. Surgical follow-up for the left breast skin thickening, for consideration of punch biopsy. 2. If punch biopsy is not performed, recommend follow-up diagnostic left breast mammography in 6 months versus follow-up breast MRI without and contrast.  Assessment and Plan: This is a 82 y.o. female status post left breast lumpectomy and sentinel lymph node biopsy in 2019, now with increasing skin thickness of the nipple/areolar complex.  - Discussed with the patient and her daughter who came with her today the results of the ultrasound and mammogram and how there is been progressive thickness increasing in the nipple/areolar complex area of the left breast.  On exam, there is no tenderness and no evidence of any other inflammatory changes such as erythema or drainage.  Discussed with the patient that potentially this could be as a result of lymphedema given the sentinel lymph node biopsy but unfortunately as radiology mentions, inflammatory breast cancer cannot be excluded.  Discussed with the patient the options for potentially doing a skin punch biopsy of the area versus follow-up imaging in 6 months as recommended by  radiology.  After further discussion of these options, the patient has decided to proceed with a skin biopsy. - The patient is currently on Eliquis for atrial fibrillation.  Have asked her to stop Eliquis for 1 day prior to procedure date.  Will schedule her for office procedure for skin biopsy on 11/20/2022.  Her last dose of Eliquis would then be on 11/18/2022.  All of their questions have been answered.  I spent 30 minutes dedicated to the care of this patient on the date of this encounter to include pre-visit review of records, face-to-face time with the patient discussing diagnosis and management, and any post-visit coordination of care.   Howie Ill, MD [facility]

## 2022-11-12 ENCOUNTER — Other Ambulatory Visit: Payer: Self-pay | Admitting: Internal Medicine

## 2022-11-13 ENCOUNTER — Encounter: Payer: Self-pay | Admitting: Internal Medicine

## 2022-11-13 ENCOUNTER — Encounter: Payer: Medicare Other | Admitting: Pharmacist

## 2022-11-13 ENCOUNTER — Telehealth: Payer: Self-pay

## 2022-11-13 NOTE — Progress Notes (Signed)
Patient has been rescheduled for Thursday, May 16 at 9:00 am with Al Corpus, PharmD.  Al Corpus, PharmD notified  Claudina Lick, [place] Clinical Pharmacy Assistant [phone]

## 2022-11-14 DIAGNOSIS — I129 Hypertensive chronic kidney disease with stage 1 through stage 4 chronic kidney disease, or unspecified chronic kidney disease: Secondary | ICD-10-CM | POA: Diagnosis not present

## 2022-11-14 DIAGNOSIS — N2581 Secondary hyperparathyroidism of renal origin: Secondary | ICD-10-CM | POA: Diagnosis not present

## 2022-11-14 DIAGNOSIS — N185 Chronic kidney disease, stage 5: Secondary | ICD-10-CM | POA: Diagnosis not present

## 2022-11-14 DIAGNOSIS — I10 Essential (primary) hypertension: Secondary | ICD-10-CM | POA: Diagnosis not present

## 2022-11-14 DIAGNOSIS — D631 Anemia in chronic kidney disease: Secondary | ICD-10-CM | POA: Diagnosis not present

## 2022-11-16 ENCOUNTER — Encounter: Payer: Medicare Other | Admitting: Pharmacist

## 2022-11-20 ENCOUNTER — Encounter: Payer: Self-pay | Admitting: Surgery

## 2022-11-20 ENCOUNTER — Other Ambulatory Visit: Payer: Self-pay | Admitting: Surgery

## 2022-11-20 ENCOUNTER — Ambulatory Visit: Payer: Medicare Other | Admitting: Surgery

## 2022-11-20 VITALS — BP 172/72 | HR 62 | Temp 98.0°F | Ht 63.0 in | Wt 166.8 lb

## 2022-11-20 DIAGNOSIS — N6459 Other signs and symptoms in breast: Secondary | ICD-10-CM

## 2022-11-20 DIAGNOSIS — L905 Scar conditions and fibrosis of skin: Secondary | ICD-10-CM | POA: Diagnosis not present

## 2022-11-20 DIAGNOSIS — Z853 Personal history of malignant neoplasm of breast: Secondary | ICD-10-CM

## 2022-11-20 NOTE — Patient Instructions (Addendum)
Just place a dry gauze dressing every day until the area scabs over  We will call with your results.   You may shower just do not rub at the area.  Skin Biopsy A skin biopsy is a procedure to remove a sample of your skin so that it can be checked for any disease. You may need a skin biopsy if you have a skin disease or abnormal changes on your skin (lesion). Tell a health care provider about: Any allergies you have. All medicines you are taking, including vitamins, herbs, eye drops, creams, and over-the-counter medicines. Any problems you or family members have had with anesthetic medicines. Any bleeding problems you have. Any surgeries you have had. Any medical conditions you have. Whether you are pregnant or may be pregnant. What are the risks? Generally, this is a safe procedure. However, problems may occur, including: Bleeding. Infection. Scarring. Allergic reaction to anesthetics, surgical materials, or ointments. What happens before the procedure? Medicines Ask your health care provider about: Changing or stopping your regular medicines. This is especially important if you are taking diabetes medicines or blood thinners. Taking medicines such as aspirin and ibuprofen. These medicines can thin your blood. Do not take these medicines unless your health care provider tells you to take them. Taking over-the-counter medicines, vitamins, herbs, and supplements. General instructions Follow instructions from your health care provider about eating or drinking restrictions. Ask your health care provider: How your surgery site will be marked. What steps will be taken to help prevent infection. These steps may include: Removing hair at the surgery site. Washing skin with a germ-killing soap. Taking antibiotic medicine. Ask your health care provider if you will need someone to take you home from the hospital or clinic after the procedure. What happens during the procedure?  You may be  given medicine to numb the area (local anesthetic). Your health care provider will take a sample using one of these steps, depending on the type of skin problem that you have: Shave biopsy. Your health care provider will shave away layers of your skin lesion with a sharp blade. After shaving, a gel or ointment may be used to control bleeding. Punch biopsy. Your health care provider will use a tool to remove all or part of the lesion. This leaves a small hole about the width of a pencil eraser. The area may be covered with a gel or ointment. Excisional or incisional biopsy. Your health care provider will use a surgical blade to remove all or part of your lesion. Your skin biopsy site may be closed with stitches (sutures). A bandage (dressing) will be applied. The procedure may vary among health care providers and hospitals. What happens after the procedure? Your skin sample will be sent to a lab for tests. Your skin biopsy site will be watched to make sure that it stops bleeding. You will be given instructions on how to care for your biopsy site. It is up to you to get the results of your procedure. Ask your health care provider, or the department that is doing the procedure, when your results will be ready. Summary A skin biopsy is a procedure to remove a sample of your skin (lesion) so that it can be checked under a microscope. Tell a health care provider about your medical history and all medicines you are taking, including vitamins, herbs, eye drops, creams, and over-the-counter medicines. Before the procedure, ask your health care provider about changing or stopping your regular medicines. During the procedure,  your health care provider will take a skin sample from the area where you have the skin problem. After the procedure, your skin sample will be sent to a laboratory for testing. This information is not intended to replace advice given to you by your health care provider. Make sure you  discuss any questions you have with your health care provider. Document Revised: 01/18/2021 Document Reviewed: 01/18/2021 Elsevier Patient Education  2023 [employer].

## 2022-11-20 NOTE — Progress Notes (Signed)
  Procedure Date:  11/20/2022  Pre-operative Diagnosis: History of left breast cancer, with thickening skin of the left breast  Post-operative Diagnosis: History of left breast cancer, with thickening skin of the left breast  Procedure: Excisional skin punch biopsy of the left breast x 2 sites  Surgeon:  Howie Ill, MD  Anesthesia: 3 mL of 1% lidocaine with epi  Estimated Blood Loss:  1 ml  Specimens: Skin punch biopsy of left breast x 2  Complications: None  Indications for Procedure:  This is a 82 y.o. female with a history of left breast cancer status post left breast lumpectomy and sentinel lymph node biopsy in 2019 with Dr. Lemar Livings.  The patient has had progressing thickness of the skin and areola of the left breast and has been recommended to obtain biopsy of these areas.  The risks of bleeding, abscess or infection, injury to surrounding structures, and need for further procedures were all discussed with the patient and she was willing to proceed.  Description of Procedure: The patient was correctly identified at bedside.  The patient was placed supine.  Appropriate time-outs were performed.  The patient's left breast was prepped and draped in usual sterile fashion.  Local anesthetic was infused intradermally at the 2 sites of biopsy.  We started in the left breast at 10 o'clock position about 4 cm from the nipple and a 4 mm punch biopsy was used to obtain a skin sample.  Scalpel was used to cut the deep portion of the specimen.  Then we proceeded to the next site at 10 o'clock position as well by about 2 cm from the nipple within the areola itself.  Again a 4 mm punch biopsy was used to obtain a skin sample.  Both were sent to pathology.  There was good hemostasis.  The wounds were then cleaned and dressed with 4 x 4 gauze and tape.  The patient tolerated the procedure well and all sharps were appropriately disposed of at the end of the case.  -Patient may shower  tomorrow. -Instructed patient to do daily dry gauze dressing changes until the wounds are fully healed. - I will contact the patient with the pathology results.  Howie Ill, MD

## 2022-11-21 ENCOUNTER — Ambulatory Visit: Payer: Medicare Other | Admitting: Pharmacist

## 2022-11-21 NOTE — Progress Notes (Signed)
Care Management & Coordination Services Pharmacy Note  11/21/2022 Name:  Alexis Tucker MRN:  [id number] DOB:  10-Oct-1940  Summary: Initial OV -HTN/HF: BP elevated at times at home prior to taking meds (range 121/46 - 197/83); 1 week ago nephrology advised to increase amlodipine to 10 mg, pt did not receive new dose yet she continues on 5 mg; nephrology also ordered clonidine 0.1 mg yesterday when BP was still elevated (though pt has not yet started higher dose of amlodipine, so unclear if clonidine is truly necessary at this point) -PAD / ? Claudication: pt has been on pentoxifylline for at least 10 years, with remote hx of PAD; she denies any claudication symptoms currently; of note metabolites can accumulate in renal failure and increase risk for bleeding; with concomitant Eliquis risk may be greater than benefit for pentoxifylline  -Anemia: HGB 9.5 (11/2022), pt is no longer taking an iron supplement -DM: A1c 6.1% (09/2022) at goal on Tradjenta, which is expensive  Recommendations/Changes made from today's visit: -Advised pt to start amlodipine 5 mg x 2 tablets (10 mg) daily; hold clonidine for now -Check BP 2 hours after AM meds; goal < 140/90 -Recommend to stop pentoxifylline - consulting with PCP -Start Vitron C (ferrous sulfate + vitamin C) once daily -Pursue PAP for Tradjenta  Follow up plan: -Health Concierge will call patient 1 week for BP update -Pharmacist follow up televisit scheduled for 1 month    Subjective: Alexis Tucker is an 82 y.o. year old female who is a primary patient of Eustaquio Boyden, MD.  The care coordination team was consulted for assistance with disease management and care coordination needs.    Engaged with patient face to face for initial visit. Patient presented with daughter [name]. She ambulates with a cane.   Recent office visits: 10/03/22 Dr Sharen Hones OV: Annual - MMSE 15/27 significant memory impairment.  09/25/22 Dr Sharen Hones OV:  hearing loss - cerumen impaction, unsuccessful irrigation. Home tx with mineral oil.  Recent consult visits: 11/14/22 Dr Thedore Mins (Nephrology): f/u - A/V graft with aneurysm - will need new graft. Increase amlodipine to 10 mg. No longer taking torsemide. Restart sodium bicarb.  11/10/22 Dr Aleen Campi (Gen Surg): unable to r/o inflammatory breast cancer. Proceed with skin biopsy.  11/01/22 Dr Azucena Cecil (Cardiology): Afib. Change isosorbide dinitrate to mononitrate 30 mg  10/16/22 Dr Thedore Mins (Nephrology): no changes  07/07/22 Dr Wyn Quaker (Vasc Surg):   Hospital visits: 10/13/22 - 10/14/22 Admission [facility]): arm pain/weakness. Negative stroke workup. Sx related to OA/rotator cuff.  06/06/22 planned admission [facility]): A/V fistulagram  Objective:  Lab Results  Component Value Date   CREATININE 5.35 (H) 10/14/2022   BUN 43 (H) 10/14/2022   GFR 6.84 (LL) 09/25/2022   EGFR 11.0 01/11/2012   GFRNONAA 8 (L) 10/14/2022   GFRAA 10 (L) 01/07/2018   NA 138 10/14/2022   K 4.3 10/14/2022   CALCIUM 9.3 10/14/2022   CO2 14 (L) 10/14/2022   GLUCOSE 98 10/14/2022    Lab Results  Component Value Date/Time   HGBA1C 6.1 09/25/2022 01:28 PM   HGBA1C 5.8 (H) 04/23/2022 06:12 AM   FRUCTOSAMINE 323 (H) 08/27/2017 03:37 PM   GFR 6.84 (LL) 09/25/2022 01:28 PM   GFR 7.52 (LL) 11/25/2021 12:17 PM   MICROALBUR 32.9 (H) 12/25/2018 03:29 PM    Last diabetic Eye exam:  Lab Results  Component Value Date/Time   HMDIABEYEEXA Retinopathy (A) 06/06/2018 12:00 AM    Last diabetic Foot exam: No results found for: "HMDIABFOOTEX"  Lab Results  Component Value Date   CHOL 195 09/25/2022   HDL 63.10 09/25/2022   LDLCALC 114 (H) 09/25/2022   LDLDIRECT 108 (H) 03/13/2014   TRIG 90.0 09/25/2022   CHOLHDL 3 09/25/2022       Latest Ref Rng & Units 09/25/2022    1:28 PM 11/25/2021   12:17 PM 11/15/2021    5:22 PM  Hepatic Function  Total Protein 6.0 - 8.3 g/dL 7.2   7.5   Albumin 3.5 - 5.2 g/dL 4.3  3.8  3.8   AST 0 - 37  U/L 14   15   ALT 0 - 35 U/L 9   10   Alk Phosphatase 39 - 117 U/L 60   70   Total Bilirubin 0.2 - 1.2 mg/dL 0.6   0.5   Bilirubin, Direct 0.0 - 0.2 mg/dL   0.1     Lab Results  Component Value Date/Time   TSH 1.82 09/25/2022 01:28 PM   TSH 2.284 11/15/2021 03:22 PM   TSH 4.45 09/19/2021 01:26 PM   FREET4 1.09 11/15/2021 05:22 PM   FREET4 0.95 12/25/2018 01:12 PM       Latest Ref Rng & Units 10/13/2022    2:00 PM 09/25/2022    1:28 PM 04/24/2022    9:00 AM  CBC  WBC 4.0 - 10.5 K/uL 6.0  5.7  6.0   Hemoglobin 12.0 - 15.0 g/dL 16.1  09.6  8.0   Hematocrit 36.0 - 46.0 % 35.8  33.1  25.1   Platelets 150 - 400 K/uL 109  126.0  100    Iron/TIBC/Ferritin/ %Sat    Component Value Date/Time   IRON 44 11/25/2021 1217   TIBC 254.8 11/25/2021 1217   FERRITIN 150.4 11/25/2021 1217   IRONPCTSAT 17.3 (L) 11/25/2021 1217    Lab Results  Component Value Date/Time   VD25OH 69.91 06/11/2020 08:40 AM   VD25OH 70.23 12/25/2018 01:12 PM   VITAMINB12 1,346 (H) 07/27/2015 05:39 PM   VITAMINB12 1,079 (H) 03/13/2014 04:24 PM    Clinical ASCVD: Yes  The ASCVD Risk score (Arnett DK, et al., 2019) failed to calculate for the following reasons:   The patient has a prior MI or stroke diagnosis    CHA2DS2/VAS Stroke Risk Points  Current as of 7 minutes ago     7 >= 2 Points: High Risk      09/25/2022   12:06 PM 12/28/2021   12:46 PM 06/18/2020    9:08 AM  Depression screen PHQ 2/9  Decreased Interest 0 0 0  Down, Depressed, Hopeless 0 0 0  PHQ - 2 Score 0 0 0  Altered sleeping 3    Tired, decreased energy 0    Change in appetite 0    Feeling bad or failure about yourself  0    Trouble concentrating 0    Moving slowly or fidgety/restless 0    Suicidal thoughts 0    PHQ-9 Score 3    Difficult doing work/chores Somewhat difficult         09/25/2022   12:06 PM  GAD 7 : Generalized Anxiety Score  Nervous, Anxious, on Edge 0  Control/stop worrying 0  Worry too much - different things  0  Trouble relaxing 0  Restless 0  Easily annoyed or irritable 0  Afraid - awful might happen 0  Total GAD 7 Score 0    Social History   Tobacco Use  Smoking Status Former   Passive exposure: Past  Smokeless Tobacco Never  Tobacco Comments   quit over 40 yrs ago   BP Readings from Last 3 Encounters:  11/20/22 (!) 172/72  11/10/22 (!) 193/75  11/01/22 (!) 158/66   Pulse Readings from Last 3 Encounters:  11/20/22 62  11/10/22 74  11/01/22 75   Wt Readings from Last 3 Encounters:  11/20/22 166 lb 12.8 oz (75.7 kg)  11/10/22 169 lb 9.6 oz (76.9 kg)  11/01/22 170 lb 2 oz (77.2 kg)   BMI Readings from Last 3 Encounters:  11/20/22 29.55 kg/m  11/10/22 30.04 kg/m  11/01/22 30.14 kg/m    Allergies  Allergen Reactions   Tramadol Other (See Comments)    nightmares   Codeine Anxiety   Sulfa Antibiotics Itching and Rash    Medications Reviewed Today     Reviewed by Kathyrn Sheriff, [facility] (Pharmacist) on 11/23/22 at 1644  Med List Status: <None>   Medication Order Taking? Sig Documenting Provider Last Dose Status Informant  allopurinol (ZYLOPRIM) 100 MG tablet [id number] Yes Take 1 tablet (100 mg total) by mouth daily. For gout Eustaquio Boyden, MD Taking Active Child  amLODipine (NORVASC) 10 MG tablet [id number] No Take 10 mg by mouth daily.  Patient not taking: Reported on 11/23/2022   Mosetta Pigeon, MD Not Taking Active   anastrozole (ARIMIDEX) 1 MG tablet [id number] Yes TAKE 1 TABLET BY MOUTH EVERY DAY Earna Coder, MD Taking Active   apixaban (ELIQUIS) 5 MG TABS tablet [id number] Yes Take 1 tablet (5 mg total) by mouth 2 (two) times daily. As blood thinner Eustaquio Boyden, MD Taking Active Child  Blood Glucose Monitoring Suppl [facility] BLOOD GLUCOSE METER) W/DEVICE KIT [id number] Yes Use as directed to check sugars daily 250.42 Eustaquio Boyden, MD Taking Active Child  carvedilol (COREG) 25 MG tablet [id number] Yes Take 0.5 tablets (12.5 mg total) by mouth  2 (two) times daily with a meal. Debbe Odea, MD Taking Active   Cholecalciferol (VITAMIN D-3) 5000 UNITS TABS [id number] Yes Take 1 tablet by mouth daily. [provider] Taking Active Child  cloNIDine HCl (KAPVAY) 0.1 MG TB12 ER tablet [id number] Yes Take 0.1 mg by mouth 2 (two) times daily. PRN [provider] Taking Active   isosorbide mononitrate (IMDUR) 30 MG 24 hr tablet [id number] Yes Take 1 tablet (30 mg total) by mouth daily. Debbe Odea, MD Taking Active   levothyroxine (SYNTHROID) 88 MCG tablet [id number] Yes Take 1 tablet (88 mcg total) by mouth daily. For hypothyroidism Eustaquio Boyden, MD Taking Active Child  linagliptin (TRADJENTA) 5 MG TABS tablet [id number] Yes Take 1 tablet (5 mg total) by mouth daily. Eustaquio Boyden, MD Taking Active Child  pantoprazole (PROTONIX) 40 MG tablet [id number] Yes Take 1 tablet (40 mg total) by mouth daily as needed. For reflux Eustaquio Boyden, MD Taking Active Child  pentoxifylline (TRENTAL) 400 MG CR tablet [id number] Yes Take 1 tablet (400 mg total) by mouth daily. For circulation Eustaquio Boyden, MD Taking Active Child  sodium bicarbonate 650 MG tablet [id number] Yes Take 1,300 mg by mouth 2 (two) times daily. [provider] Taking Active             SDOH:  (Social Determinants of Health) assessments and interventions performed: No SDOH Interventions    Flowsheet Row Clinical Support from 12/25/2018 in [facility] [place]  SDOH Interventions   Depression Interventions/Treatment  PHQ2-9 Score <4 Follow-up Not Indicated       Medication Assistance: None required.  Patient affirms current  coverage meets needs.  Medication Access: Name and location of current pharmacy:  [facility] [phone] [facility] [place] - [address] [place] [address] Phone: [phone] Fax: [phone]  Within the past 30 days, how often has patient missed a dose of  medication? 0 Is a pillbox or other method used to improve adherence? Yes  Factors that may affect medication adherence?  Confusion between differing med lists/providers Are meds synced by current pharmacy? No  Are meds delivered by current pharmacy? No  Does patient experience delays in picking up medications due to transportation concerns? No   Compliance/Adherence/Medication fill history: Care Gaps: Foot exam Eye exam (due 06/2019)  Star-Rating Drugs: Tradjenta   Assessment/Plan   Heart Failure (Goal: BP < 140/90) -Not ideally controlled - BP elevated in recent OV; pt is checking BP in AM prior to medications at home -amlodipine dose was increased to 10 mg per nephrology recently but pt has not started this yet and continues on 5 mg; per pt nephrology called in clonidine yesterday due to BP still being above goal, however pt had not started higher dose of amlodipine yet so it's not clear if she will need clonidine at this point -Last ejection fraction: 55-60% (Date: 10/2021) -Current home BP/HR readings: 194/73, 166/71, 172/67, 121/46, 128/59, 131/68, 165/58, 180/101, 121/75, 147/68, 197/83 -Current home daily weights: not checking -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 Hold off on taking clonidine for now. -Advised to check BP 2 hrs after medication in AM  Hyperlipidemia: (LDL goal < 70) -Not ideally controlled - LDL 114 (09/2022); per review statin has been considered/recommended a few times in  the past but never prescribed; statin not addressed today but will address at follow up -Hx CVA, ?PAD -Current treatment: Pentoxifylline 400 mg daily - Query appropriate -Medications previously tried: none  -Reviewed indication for Trental - pt has been on this at least 10 years, she has vague/remote history of PAD; she denies claudication symptoms today (no pain when walking); notably Trental metabolites can accumulate in renal failure and increase risk for bleeding, with concomitant Eliquis it may be safer to stop Trental, especially as benefit is unclear at this point -Recommend to stop pentoxifylline - consulting with PCP  Atrial Fibrillation (Goal: prevent stroke and major bleeding) -Controlled -CHADSVASC: 7 -Current treatment: Carvedilol 12.5 mg BID - Appropriate, Effective, Safe, Accessible Eliquis 5 mg BID - Appropriate, Effective, Safe, Accessible -Medications previously tried: n/a -Counseled on increased risk of stroke due to Afib and benefits of anticoagulation for stroke prevention; importance of adherence to anticoagulant exactly as prescribed; bleeding risk associated with Eliquis and importance of self-monitoring for signs/symptoms of bleeding; avoidance of NSAIDs due to increased bleeding risk with anticoagulants; seeking medical attention after a head injury or if there is blood in the urine/stool; -Recommended to continue current medication  Diabetes (A1c goal <7%) -Controlled - A1c 6.1% (09/2022) at goal; pt reports Tradjenta cost is high -Current medications: Tradjenta 5 mg daily - Appropriate, Effective, Safe, Query Accessible Testing supplies -Medications previously tried: n/a  -Current home  glucose readings: not checking -Denies hypoglycemic/hyperglycemic symptoms -Educated on A1c and blood sugar goals; -Counseled to check feet daily and get yearly eye exams -Recommended to continue current medication; pursue PAP for Tradjenta  Chronic Kidney Disease Stage 5  -All  medications assessed for renal dosing and appropriateness in chronic kidney disease. -On sodium bicarb 650 mg -Recommended to continue current medication  Osteoporosis (Goal prevent fractures) -Not ideally controlled - complicated by CKD / mineral bone disease -Last DEXA Scan: 08/2017   T-Score femoral neck: -2.4  T-Score forearm radius: -2.7 -Patient is a candidate for pharmacologic treatment due to T-Score < -2.5; however CKD limits options (Cannot take bisphosphonate with GFR <30. Risk for severe hypocalcemia with Prolia) -Current treatment  Vitamin D 5000 IU daily - Appropriate, Effective, Safe, Accessible -Medications previously tried: none  -Recommend weight-bearing and muscle strengthening exercises for building and maintaining bone density.  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

## 2022-11-21 NOTE — Patient Instructions (Signed)
Visit Information  Phone number for Pharmacist: [phone]  Thank you for meeting with me to discuss your medications! Below is a summary of what we talked about during the visit:   Take amlodipine 5 mg x 2 tablets in AM. Hold off on clonidine for now, check BP daily 2 hrs after AM medication.  Start Vitron C - 1 daily, If you tolerate this you can increase to twice daily. Make sure to separate this from levothyroxine by 4 hours.  Fill out the forms for Tradjenta and return to the office.  I will check on pentoxifylline and see if you need to continue this.  Ask nephrology to check Magnesium   Al Corpus, PharmD, BCACP Clinical Pharmacist [facility] at [facility] [phone]

## 2022-11-22 ENCOUNTER — Encounter: Payer: Self-pay | Admitting: Surgery

## 2022-11-28 ENCOUNTER — Other Ambulatory Visit: Payer: Self-pay | Admitting: Family Medicine

## 2022-11-30 ENCOUNTER — Telehealth: Payer: Self-pay

## 2022-11-30 NOTE — Progress Notes (Cosign Needed)
Called patient; no answer; left message.  Lindsey Foltanski, PharmD notified  Abundio Teuscher, RMA Clinical Pharmacy Assistant [phone]   

## 2022-11-30 NOTE — Telephone Encounter (Signed)
-----   Message from Kathyrn Sheriff, [facility] sent at 11/23/2022  5:04 PM EDT ----- Regarding: call pt/daughter [name] Please check BP log for the past week - see my note. Should be taking amlodipine 10 mg/day and checking BP 2 hrs after meds in AM.

## 2022-12-01 NOTE — Progress Notes (Signed)
Called patient's daughter for blood pressure log as previously discussed with Al Corpus, PharmD. No answer; left message.  Al Corpus, PharmD notified  Claudina Lick, [place] Clinical Pharmacy Assistant [phone]

## 2022-12-03 ENCOUNTER — Emergency Department: Payer: Medicare Other

## 2022-12-03 ENCOUNTER — Inpatient Hospital Stay
Admission: EM | Admit: 2022-12-03 | Discharge: 2022-12-15 | DRG: 193 | Disposition: A | Discharge status: Home Health | Payer: Medicare Other | Attending: Student | Admitting: Student

## 2022-12-03 ENCOUNTER — Other Ambulatory Visit: Payer: Self-pay

## 2022-12-03 DIAGNOSIS — Z882 Allergy status to sulfonamides status: Secondary | ICD-10-CM

## 2022-12-03 DIAGNOSIS — H353210 Exudative age-related macular degeneration, right eye, stage unspecified: Secondary | ICD-10-CM | POA: Diagnosis present

## 2022-12-03 DIAGNOSIS — Z1152 Encounter for screening for COVID-19: Secondary | ICD-10-CM

## 2022-12-03 DIAGNOSIS — Z992 Dependence on renal dialysis: Secondary | ICD-10-CM

## 2022-12-03 DIAGNOSIS — D638 Anemia in other chronic diseases classified elsewhere: Secondary | ICD-10-CM | POA: Insufficient documentation

## 2022-12-03 DIAGNOSIS — H5462 Unqualified visual loss, left eye, normal vision right eye: Secondary | ICD-10-CM | POA: Diagnosis present

## 2022-12-03 DIAGNOSIS — J189 Pneumonia, unspecified organism: Principal | ICD-10-CM | POA: Diagnosis present

## 2022-12-03 DIAGNOSIS — Z66 Do not resuscitate: Secondary | ICD-10-CM | POA: Diagnosis present

## 2022-12-03 DIAGNOSIS — I480 Paroxysmal atrial fibrillation: Secondary | ICD-10-CM | POA: Diagnosis not present

## 2022-12-03 DIAGNOSIS — I132 Hypertensive heart and chronic kidney disease with heart failure and with stage 5 chronic kidney disease, or end stage renal disease: Secondary | ICD-10-CM | POA: Diagnosis present

## 2022-12-03 DIAGNOSIS — E890 Postprocedural hypothyroidism: Secondary | ICD-10-CM | POA: Diagnosis present

## 2022-12-03 DIAGNOSIS — I5033 Acute on chronic diastolic (congestive) heart failure: Secondary | ICD-10-CM | POA: Diagnosis present

## 2022-12-03 DIAGNOSIS — N185 Chronic kidney disease, stage 5: Secondary | ICD-10-CM | POA: Diagnosis present

## 2022-12-03 DIAGNOSIS — M6281 Muscle weakness (generalized): Secondary | ICD-10-CM | POA: Diagnosis not present

## 2022-12-03 DIAGNOSIS — I2510 Atherosclerotic heart disease of native coronary artery without angina pectoris: Secondary | ICD-10-CM | POA: Diagnosis present

## 2022-12-03 DIAGNOSIS — Z5320 Procedure and treatment not carried out because of patient's decision for unspecified reasons: Secondary | ICD-10-CM | POA: Diagnosis not present

## 2022-12-03 DIAGNOSIS — D696 Thrombocytopenia, unspecified: Secondary | ICD-10-CM | POA: Diagnosis present

## 2022-12-03 DIAGNOSIS — Z7901 Long term (current) use of anticoagulants: Secondary | ICD-10-CM | POA: Diagnosis not present

## 2022-12-03 DIAGNOSIS — R1084 Generalized abdominal pain: Secondary | ICD-10-CM | POA: Diagnosis not present

## 2022-12-03 DIAGNOSIS — M879 Osteonecrosis, unspecified: Secondary | ICD-10-CM | POA: Diagnosis present

## 2022-12-03 DIAGNOSIS — C50412 Malignant neoplasm of upper-outer quadrant of left female breast: Secondary | ICD-10-CM | POA: Diagnosis not present

## 2022-12-03 DIAGNOSIS — N2581 Secondary hyperparathyroidism of renal origin: Secondary | ICD-10-CM | POA: Diagnosis not present

## 2022-12-03 DIAGNOSIS — Z741 Need for assistance with personal care: Secondary | ICD-10-CM | POA: Diagnosis not present

## 2022-12-03 DIAGNOSIS — Z96651 Presence of right artificial knee joint: Secondary | ICD-10-CM | POA: Diagnosis present

## 2022-12-03 DIAGNOSIS — Z85038 Personal history of other malignant neoplasm of large intestine: Secondary | ICD-10-CM

## 2022-12-03 DIAGNOSIS — N186 End stage renal disease: Secondary | ICD-10-CM

## 2022-12-03 DIAGNOSIS — M1990 Unspecified osteoarthritis, unspecified site: Secondary | ICD-10-CM | POA: Diagnosis present

## 2022-12-03 DIAGNOSIS — R109 Unspecified abdominal pain: Secondary | ICD-10-CM

## 2022-12-03 DIAGNOSIS — N189 Chronic kidney disease, unspecified: Secondary | ICD-10-CM

## 2022-12-03 DIAGNOSIS — E1122 Type 2 diabetes mellitus with diabetic chronic kidney disease: Secondary | ICD-10-CM | POA: Diagnosis present

## 2022-12-03 DIAGNOSIS — Z9842 Cataract extraction status, left eye: Secondary | ICD-10-CM

## 2022-12-03 DIAGNOSIS — M109 Gout, unspecified: Secondary | ICD-10-CM | POA: Diagnosis present

## 2022-12-03 DIAGNOSIS — Z79890 Hormone replacement therapy (postmenopausal): Secondary | ICD-10-CM

## 2022-12-03 DIAGNOSIS — K5900 Constipation, unspecified: Secondary | ICD-10-CM | POA: Diagnosis not present

## 2022-12-03 DIAGNOSIS — M6259 Muscle wasting and atrophy, not elsewhere classified, multiple sites: Secondary | ICD-10-CM | POA: Diagnosis not present

## 2022-12-03 DIAGNOSIS — J9601 Acute respiratory failure with hypoxia: Secondary | ICD-10-CM | POA: Diagnosis present

## 2022-12-03 DIAGNOSIS — K219 Gastro-esophageal reflux disease without esophagitis: Secondary | ICD-10-CM | POA: Diagnosis present

## 2022-12-03 DIAGNOSIS — Z885 Allergy status to narcotic agent status: Secondary | ICD-10-CM

## 2022-12-03 DIAGNOSIS — D631 Anemia in chronic kidney disease: Secondary | ICD-10-CM | POA: Diagnosis not present

## 2022-12-03 DIAGNOSIS — N179 Acute kidney failure, unspecified: Secondary | ICD-10-CM | POA: Diagnosis present

## 2022-12-03 DIAGNOSIS — Z8261 Family history of arthritis: Secondary | ICD-10-CM

## 2022-12-03 DIAGNOSIS — M810 Age-related osteoporosis without current pathological fracture: Secondary | ICD-10-CM | POA: Diagnosis present

## 2022-12-03 DIAGNOSIS — Z961 Presence of intraocular lens: Secondary | ICD-10-CM | POA: Diagnosis present

## 2022-12-03 DIAGNOSIS — I120 Hypertensive chronic kidney disease with stage 5 chronic kidney disease or end stage renal disease: Secondary | ICD-10-CM | POA: Diagnosis not present

## 2022-12-03 DIAGNOSIS — Z90710 Acquired absence of both cervix and uterus: Secondary | ICD-10-CM

## 2022-12-03 DIAGNOSIS — Z86010 Personal history of colonic polyps: Secondary | ICD-10-CM

## 2022-12-03 DIAGNOSIS — R41841 Cognitive communication deficit: Secondary | ICD-10-CM | POA: Diagnosis not present

## 2022-12-03 DIAGNOSIS — Z170 Estrogen receptor positive status [ER+]: Secondary | ICD-10-CM

## 2022-12-03 DIAGNOSIS — E11311 Type 2 diabetes mellitus with unspecified diabetic retinopathy with macular edema: Secondary | ICD-10-CM | POA: Diagnosis present

## 2022-12-03 DIAGNOSIS — I10 Essential (primary) hypertension: Secondary | ICD-10-CM | POA: Diagnosis present

## 2022-12-03 DIAGNOSIS — E1142 Type 2 diabetes mellitus with diabetic polyneuropathy: Secondary | ICD-10-CM | POA: Diagnosis present

## 2022-12-03 DIAGNOSIS — Z7984 Long term (current) use of oral hypoglycemic drugs: Secondary | ICD-10-CM | POA: Diagnosis not present

## 2022-12-03 DIAGNOSIS — R4182 Altered mental status, unspecified: Secondary | ICD-10-CM | POA: Diagnosis not present

## 2022-12-03 DIAGNOSIS — Z853 Personal history of malignant neoplasm of breast: Secondary | ICD-10-CM

## 2022-12-03 DIAGNOSIS — R918 Other nonspecific abnormal finding of lung field: Secondary | ICD-10-CM | POA: Insufficient documentation

## 2022-12-03 DIAGNOSIS — Z9049 Acquired absence of other specified parts of digestive tract: Secondary | ICD-10-CM

## 2022-12-03 DIAGNOSIS — I509 Heart failure, unspecified: Secondary | ICD-10-CM | POA: Diagnosis not present

## 2022-12-03 DIAGNOSIS — R1312 Dysphagia, oropharyngeal phase: Secondary | ICD-10-CM | POA: Diagnosis not present

## 2022-12-03 DIAGNOSIS — R0602 Shortness of breath: Secondary | ICD-10-CM | POA: Diagnosis not present

## 2022-12-03 DIAGNOSIS — E1169 Type 2 diabetes mellitus with other specified complication: Secondary | ICD-10-CM | POA: Diagnosis present

## 2022-12-03 DIAGNOSIS — Z87891 Personal history of nicotine dependence: Secondary | ICD-10-CM | POA: Diagnosis not present

## 2022-12-03 DIAGNOSIS — Z7902 Long term (current) use of antithrombotics/antiplatelets: Secondary | ICD-10-CM

## 2022-12-03 DIAGNOSIS — H409 Unspecified glaucoma: Secondary | ICD-10-CM | POA: Diagnosis present

## 2022-12-03 DIAGNOSIS — R2689 Other abnormalities of gait and mobility: Secondary | ICD-10-CM | POA: Diagnosis not present

## 2022-12-03 DIAGNOSIS — H353120 Nonexudative age-related macular degeneration, left eye, stage unspecified: Secondary | ICD-10-CM | POA: Diagnosis present

## 2022-12-03 DIAGNOSIS — Z79899 Other long term (current) drug therapy: Secondary | ICD-10-CM

## 2022-12-03 DIAGNOSIS — Z79811 Long term (current) use of aromatase inhibitors: Secondary | ICD-10-CM | POA: Diagnosis not present

## 2022-12-03 DIAGNOSIS — Z8673 Personal history of transient ischemic attack (TIA), and cerebral infarction without residual deficits: Secondary | ICD-10-CM

## 2022-12-03 DIAGNOSIS — W19XXXA Unspecified fall, initial encounter: Secondary | ICD-10-CM | POA: Diagnosis not present

## 2022-12-03 DIAGNOSIS — Z833 Family history of diabetes mellitus: Secondary | ICD-10-CM

## 2022-12-03 DIAGNOSIS — Z8249 Family history of ischemic heart disease and other diseases of the circulatory system: Secondary | ICD-10-CM

## 2022-12-03 DIAGNOSIS — I482 Chronic atrial fibrillation, unspecified: Secondary | ICD-10-CM | POA: Diagnosis present

## 2022-12-03 DIAGNOSIS — Z8619 Personal history of other infectious and parasitic diseases: Secondary | ICD-10-CM

## 2022-12-03 LAB — HEPATIC FUNCTION PANEL
ALT: 10 U/L (ref 0–44)
AST: 16 U/L (ref 15–41)
Albumin: 3.8 g/dL (ref 3.5–5.0)
Alkaline Phosphatase: 51 U/L (ref 38–126)
Bilirubin, Direct: 0.2 mg/dL (ref 0.0–0.2)
Indirect Bilirubin: 1 mg/dL — ABNORMAL HIGH (ref 0.3–0.9)
Total Bilirubin: 1.2 mg/dL (ref 0.3–1.2)
Total Protein: 7.1 g/dL (ref 6.5–8.1)

## 2022-12-03 LAB — BASIC METABOLIC PANEL
Anion gap: 11 (ref 5–15)
BUN: 51 mg/dL — ABNORMAL HIGH (ref 8–23)
CO2: 23 mmol/L (ref 22–32)
Calcium: 8.9 mg/dL (ref 8.9–10.3)
Chloride: 106 mmol/L (ref 98–111)
Creatinine, Ser: 6.1 mg/dL — ABNORMAL HIGH (ref 0.44–1.00)
GFR, Estimated: 7 mL/min — ABNORMAL LOW (ref >60)
Glucose, Bld: 140 mg/dL — ABNORMAL HIGH (ref 70–99)
Potassium: 4.4 mmol/L (ref 3.5–5.1)
Sodium: 140 mmol/L (ref 135–145)

## 2022-12-03 LAB — CBC
HCT: 29.1 % — ABNORMAL LOW (ref 36.0–46.0)
Hemoglobin: 9.2 g/dL — ABNORMAL LOW (ref 12.0–15.0)
MCH: 30.2 pg (ref 26.0–34.0)
MCHC: 31.6 g/dL (ref 30.0–36.0)
MCV: 95.4 fL (ref 80.0–100.0)
Platelets: 100 10*3/uL — ABNORMAL LOW (ref 150–400)
RBC: 3.05 MIL/uL — ABNORMAL LOW (ref 3.87–5.11)
RDW: 14.3 % (ref 11.5–15.5)
WBC: 8.1 10*3/uL (ref 4.0–10.5)
nRBC: 0 % (ref 0.0–0.2)

## 2022-12-03 LAB — BRAIN NATRIURETIC PEPTIDE: B Natriuretic Peptide: 758.4 pg/mL — ABNORMAL HIGH (ref 0.0–100.0)

## 2022-12-03 LAB — RESP PANEL BY RT-PCR (RSV, FLU A&B, COVID)  RVPGX2
Influenza A by PCR: NEGATIVE
Influenza B by PCR: NEGATIVE
Resp Syncytial Virus by PCR: NEGATIVE
SARS Coronavirus 2 by RT PCR: NEGATIVE

## 2022-12-03 LAB — LACTIC ACID, PLASMA: Lactic Acid, Venous: 1.2 mmol/L (ref 0.5–1.9)

## 2022-12-03 LAB — URINALYSIS, W/ REFLEX TO CULTURE (INFECTION SUSPECTED)
Bilirubin Urine: NEGATIVE
Glucose, UA: 50 mg/dL — AB
Ketones, ur: NEGATIVE mg/dL
Leukocytes,Ua: NEGATIVE
Nitrite: NEGATIVE
Protein, ur: >=300 mg/dL — AB
Specific Gravity, Urine: 1.012 (ref 1.005–1.030)
pH: 7 (ref 5.0–8.0)

## 2022-12-03 LAB — APTT: aPTT: 41 seconds — ABNORMAL HIGH (ref 24–36)

## 2022-12-03 LAB — PROTIME-INR
INR: 2.6 — ABNORMAL HIGH (ref 0.8–1.2)
Prothrombin Time: 28.2 seconds — ABNORMAL HIGH (ref 11.4–15.2)

## 2022-12-03 MED ORDER — ALLOPURINOL 100 MG PO TABS
100.0000 mg | ORAL_TABLET | Freq: Every day | ORAL | Status: DC
  Administered 2022-12-04 – 2022-12-15 (×11): 100 mg via ORAL
  Filled 2022-12-03 (×11): qty 1

## 2022-12-03 MED ORDER — HEPARIN SODIUM (PORCINE) 5000 UNIT/ML IJ SOLN: 5000.0000 [IU] | Freq: Three times a day (TID) | INTRAMUSCULAR | Status: DC

## 2022-12-03 MED ORDER — SODIUM CHLORIDE 0.9 % IV SOLN
2.0000 g | INTRAVENOUS | Status: DC
  Administered 2022-12-03 – 2022-12-04 (×2): 2 g via INTRAVENOUS
  Filled 2022-12-03 (×2): qty 20

## 2022-12-03 MED ORDER — LACTATED RINGERS IV SOLN: INTRAVENOUS | Status: DC

## 2022-12-03 MED ORDER — METRONIDAZOLE 500 MG/100ML IV SOLN
500.0000 mg | Freq: Once | INTRAVENOUS | Status: AC
  Administered 2022-12-03: 500 mg via INTRAVENOUS
  Filled 2022-12-03: qty 100

## 2022-12-03 MED ORDER — ANASTROZOLE 1 MG PO TABS
1.0000 mg | ORAL_TABLET | Freq: Every day | ORAL | Status: DC
  Administered 2022-12-04 – 2022-12-15 (×11): 1 mg via ORAL
  Filled 2022-12-03 (×12): qty 1

## 2022-12-03 MED ORDER — HYDRALAZINE HCL 20 MG/ML IJ SOLN: 5.0000 mg | Freq: Three times a day (TID) | INTRAMUSCULAR | Status: AC | PRN

## 2022-12-03 MED ORDER — SODIUM CHLORIDE 0.9 % IV SOLN
2.0000 g | INTRAVENOUS | Status: DC
Start: 2022-12-04 — End: 2022-12-03

## 2022-12-03 MED ORDER — ACETAMINOPHEN 325 MG PO TABS
650.0000 mg | ORAL_TABLET | Freq: Four times a day (QID) | ORAL | Status: AC | PRN
  Administered 2022-12-04: 650 mg via ORAL
  Filled 2022-12-03: qty 2

## 2022-12-03 MED ORDER — SENNOSIDES-DOCUSATE SODIUM 8.6-50 MG PO TABS: 1.0000 | ORAL_TABLET | Freq: Every evening | ORAL | Status: DC | PRN

## 2022-12-03 MED ORDER — SODIUM CHLORIDE 0.9 % IV SOLN
500.0000 mg | INTRAVENOUS | Status: DC
  Administered 2022-12-03: 500 mg via INTRAVENOUS
  Filled 2022-12-03: qty 5

## 2022-12-03 MED ORDER — SODIUM BICARBONATE 650 MG PO TABS
1300.0000 mg | ORAL_TABLET | Freq: Two times a day (BID) | ORAL | Status: DC
  Administered 2022-12-03 – 2022-12-15 (×23): 1300 mg via ORAL
  Filled 2022-12-03 (×23): qty 2

## 2022-12-03 MED ORDER — APIXABAN 5 MG PO TABS
5.0000 mg | ORAL_TABLET | Freq: Two times a day (BID) | ORAL | Status: DC
  Administered 2022-12-03 – 2022-12-04 (×2): 5 mg via ORAL
  Filled 2022-12-03 (×2): qty 1

## 2022-12-03 MED ORDER — LEVOTHYROXINE SODIUM 88 MCG PO TABS
88.0000 ug | ORAL_TABLET | Freq: Every day | ORAL | Status: DC
  Administered 2022-12-04 – 2022-12-15 (×11): 88 ug via ORAL
  Filled 2022-12-03 (×13): qty 1

## 2022-12-03 MED ORDER — ALBUTEROL SULFATE (2.5 MG/3ML) 0.083% IN NEBU
2.5000 mg | INHALATION_SOLUTION | Freq: Three times a day (TID) | RESPIRATORY_TRACT | Status: AC
  Administered 2022-12-03 – 2022-12-04 (×2): 2.5 mg via RESPIRATORY_TRACT
  Filled 2022-12-03 (×2): qty 3

## 2022-12-03 MED ORDER — ALBUTEROL SULFATE (2.5 MG/3ML) 0.083% IN NEBU: 2.5000 mg | INHALATION_SOLUTION | Freq: Four times a day (QID) | RESPIRATORY_TRACT | Status: DC | PRN

## 2022-12-03 MED ORDER — ONDANSETRON HCL 4 MG PO TABS: 4.0000 mg | ORAL_TABLET | Freq: Four times a day (QID) | ORAL | Status: AC | PRN

## 2022-12-03 MED ORDER — ISOSORBIDE MONONITRATE ER 30 MG PO TB24
30.0000 mg | ORAL_TABLET | Freq: Every day | ORAL | Status: DC
  Administered 2022-12-03 – 2022-12-15 (×12): 30 mg via ORAL
  Filled 2022-12-03 (×12): qty 1

## 2022-12-03 MED ORDER — SODIUM CHLORIDE 0.9 % IV BOLUS (SEPSIS)
500.0000 mL | Freq: Once | INTRAVENOUS | Status: AC
  Administered 2022-12-03: 500 mL via INTRAVENOUS

## 2022-12-03 MED ORDER — AMLODIPINE BESYLATE 10 MG PO TABS
10.0000 mg | ORAL_TABLET | Freq: Every day | ORAL | Status: DC
  Administered 2022-12-03 – 2022-12-15 (×11): 10 mg via ORAL
  Filled 2022-12-03: qty 1
  Filled 2022-12-03: qty 2
  Filled 2022-12-03 (×2): qty 1
  Filled 2022-12-03: qty 2
  Filled 2022-12-03 (×2): qty 1
  Filled 2022-12-03: qty 2
  Filled 2022-12-03 (×4): qty 1

## 2022-12-03 MED ORDER — CARVEDILOL 6.25 MG PO TABS
12.5000 mg | ORAL_TABLET | Freq: Two times a day (BID) | ORAL | Status: DC
  Administered 2022-12-03 – 2022-12-15 (×22): 12.5 mg via ORAL
  Filled 2022-12-03 (×4): qty 1
  Filled 2022-12-03: qty 2
  Filled 2022-12-03: qty 1
  Filled 2022-12-03 (×2): qty 2
  Filled 2022-12-03: qty 1
  Filled 2022-12-03: qty 2
  Filled 2022-12-03: qty 1
  Filled 2022-12-03 (×3): qty 2
  Filled 2022-12-03 (×3): qty 1
  Filled 2022-12-03: qty 2
  Filled 2022-12-03 (×4): qty 1
  Filled 2022-12-03: qty 2

## 2022-12-03 MED ORDER — PANTOPRAZOLE SODIUM 40 MG PO TBEC
40.0000 mg | DELAYED_RELEASE_TABLET | Freq: Every day | ORAL | Status: DC
  Administered 2022-12-03 – 2022-12-14 (×11): 40 mg via ORAL
  Filled 2022-12-03 (×12): qty 1

## 2022-12-03 MED ORDER — ONDANSETRON HCL 4 MG/2ML IJ SOLN
4.0000 mg | Freq: Four times a day (QID) | INTRAMUSCULAR | Status: AC | PRN
  Administered 2022-12-07: 4 mg via INTRAVENOUS
  Filled 2022-12-03 (×2): qty 2

## 2022-12-03 MED ORDER — ACETAMINOPHEN 650 MG RE SUPP: 650.0000 mg | Freq: Four times a day (QID) | RECTAL | Status: AC | PRN

## 2022-12-03 MED ORDER — AZITHROMYCIN 500 MG IV SOLR: 500.0000 mg | INTRAVENOUS | Status: DC

## 2022-12-03 NOTE — ED Provider Notes (Signed)
[facility] Provider Note   Event Date/Time   First MD Initiated Contact with Patient 12/03/22 1526     (approximate) History  Shortness of Breath  HPI Alexis Tucker is a 82 y.o. 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  The patient's daughter states that she was giving her morning meds and noticed that she had increased work of breathing as well as oxygen saturation of 77% on room air.  Daughters encouraged patient to take some deep breaths and improved oxygen saturation to 83%.  SpO2 in triage noted to be 84% and placed on 3 L nasal cannula with decreased breath sounds on the left.  Patient denies subjective fevers but states that she has been having chills over the past few days ROS: Patient currently denies any vision changes, tinnitus, difficulty speaking, facial droop, sore throat, chest pain, abdominal pain, nausea/vomiting/diarrhea, dysuria, or weakness/numbness/paresthesias in any extremity   Physical Exam  Triage Vital Signs: ED Triage Vitals  Enc Vitals Group     BP 12/03/22 1154 (!) 160/64     Pulse Rate 12/03/22 1154 88     Resp 12/03/22 1154 20     Temp 12/03/22 1154 99.8 F (37.7 C)     Temp Source 12/03/22 1154 Oral     SpO2 12/03/22 1154 (!) 84 %     Weight 12/03/22 1201 166 lb (75.3 kg)     Height 12/03/22 1201 5\' 3"  (1.6 m)     Head Circumference --      Peak Flow --      Pain Score 12/03/22 1200 0     Pain Loc --      Pain Edu? --      Excl. in GC? --    Most recent vital signs: Vitals:   12/03/22 1200 12/03/22 1354  BP:  (!) 142/90  Pulse:  87  Resp:  18  Temp:  98.6 F (37 C)  SpO2: 95% 98%   General: Awake, oriented x4. CV:  Good peripheral perfusion.  Resp:  Increased effort.  Decreased breath sounds over the left lung fields.  3 L nasal cannula in place Abd:  No distention.  Other:  Elderly overweight  African-American female laying in bed in no acute distress ED Results / Procedures / Treatments  Labs (all labs ordered are listed, but only abnormal results are displayed) Labs Reviewed  BASIC METABOLIC PANEL - Abnormal; Notable for the following components:      Result Value   Glucose, Bld 140 (*)    BUN 51 (*)    Creatinine, Ser 6.10 (*)    GFR, Estimated 7 (*)    All other components within normal limits  CBC - Abnormal; Notable for the following components:   RBC 3.05 (*)    Hemoglobin 9.2 (*)    HCT 29.1 (*)    Platelets 100 (*)    All other components within normal limits  URINALYSIS, W/ REFLEX TO CULTURE (INFECTION SUSPECTED) - Abnormal; Notable for the following components:   Color, Urine YELLOW (*)    APPearance HAZY (*)    Glucose, UA 50 (*)    Hgb urine dipstick SMALL (*)    Protein, ur >=300 (*)    Bacteria, UA RARE (*)    All other components within normal limits  RESP PANEL BY RT-PCR (RSV, FLU A&B, COVID)  RVPGX2  CULTURE,  BLOOD (ROUTINE X 2)  CULTURE, BLOOD (ROUTINE X 2)  LACTIC ACID, PLASMA  LACTIC ACID, PLASMA  PROTIME-INR  APTT  BRAIN NATRIURETIC PEPTIDE  HEPATIC FUNCTION PANEL   EKG ED ECG REPORT I, Merwyn Katos, the attending physician, personally viewed and interpreted this ECG. Date: 12/03/2022 EKG Time: 1205 Rate: 87 Rhythm: normal sinus rhythm QRS Axis: normal Intervals: normal ST/T Wave abnormalities: normal Narrative Interpretation: no evidence of acute ischemia RADIOLOGY ED MD interpretation: 2 view chest x-ray interpreted independently by me and shows new left lung multilobar opacity suspicious for pneumonia -Agree with radiology assessment Official radiology report(s): DG Chest 2 View  Result Date: 12/03/2022 CLINICAL DATA:  82 year old female with shortness of breath. EXAM: CHEST - 2 VIEW COMPARISON:  CT chest 10/13/2022 and earlier. FINDINGS: PA and lateral views at 1219 hours demonstrate patchy, coarse, and confluent new left lung  airspace opacity which appears to affect both the lingula and lower lobe. Similar lung volumes to April. No associated pleural effusion. Pulmonary vascularity elsewhere does not appear significantly changed. No pneumothorax. No confluent right lung opacity. Stable cardiac size and mediastinal contours. Calcified aortic atherosclerosis. Extensive abdominal Calcified aortic atherosclerosis. No acute osseous abnormality identified. Negative visible bowel gas. IMPRESSION: 1. New left lung multilobar patchy and confluent opacity, suspicious for aspiration or pneumonia. Asymmetric edema considered but felt unlikely. No pleural effusion. 2. Right lung appears negative. 3. Aortic Atherosclerosis (ICD10-I70.0). Electronically Signed   By: Odessa Fleming M.D.   On: 12/03/2022 12:34   PROCEDURES: Critical Care performed: Yes, see critical care procedure note(s) .1-3 Lead EKG Interpretation  Performed by: Merwyn Katos, MD Authorized by: Merwyn Katos, MD     Interpretation: normal     ECG rate:  71   ECG rate assessment: normal     Rhythm: sinus rhythm     Ectopy: none     Conduction: normal    MEDICATIONS ORDERED IN ED: Medications  lactated ringers infusion (has no administration in time range)  sodium chloride 0.9 % bolus 500 mL (500 mLs Intravenous New Bag/Given 12/03/22 1638)  cefTRIAXone (ROCEPHIN) 2 g in sodium chloride 0.9 % 100 mL IVPB (2 g Intravenous New Bag/Given 12/03/22 1638)  azithromycin (ZITHROMAX) 500 mg in sodium chloride 0.9 % 250 mL IVPB (has no administration in time range)  metroNIDAZOLE (FLAGYL) IVPB 500 mg (has no administration in time range)   IMPRESSION / MDM / ASSESSMENT AND PLAN / ED COURSE  I reviewed the triage vital signs and the nursing notes.                             The patient is on the cardiac monitor to evaluate for evidence of arrhythmia and/or significant heart rate changes. Patient's presentation is most consistent with acute presentation with potential threat to  life or bodily function. Presents with shortness of breath, cough, and malaise concerning for pneumonia.  DDx: PE, COPD exacerbation, Pneumothorax, TB, Atypical ACS, Esophageal Rupture, Toxic Exposure, Foreign Body Airway Obstruction.  Workup: CXR CBC, CMP, lactate, troponin  Given History, Exam, and Workup presentation most consistent with pneumonia.  Findings: Left-sided opacity on chest x-ray  Tx: Ceftriaxone 1g IV Azithromycin 500mg  IV Flagyl  1640 Reassessment: As patient is continuing to require supplemental oxygenation for acute hypoxic respiratory failure, patient will require admission to the internal medicine service for further evaluation and management  Disposition: Admit   FINAL CLINICAL IMPRESSION(S) / ED DIAGNOSES  Final diagnoses:  Community acquired pneumonia of left upper lobe of lung  SOB (shortness of breath)  Acute respiratory failure with hypoxia (HCC)   Rx / DC Orders   ED Discharge Orders     None      Note:  This document was prepared using Dragon voice recognition software and may include unintentional dictation errors.   Merwyn Katos, MD 12/03/22 [phone]

## 2022-12-03 NOTE — Assessment & Plan Note (Addendum)
Resolved.  Likely secondary to multifocal pneumonia and fluid overload.  Patient on room air for past couple days.

## 2022-12-03 NOTE — H&P (Signed)
History and Physical   Alexis Tucker ZOX:[id number] DOB: 1940-10-25 DOA: 12/03/2022  PCP: Eustaquio Boyden, MD  Outpatient Specialists: Dr. Mosetta Pigeon, nephrology Patient coming from: Home via POV  I have personally briefly reviewed patient's old medical records in [facility] EMR.  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  Vitals in the ED showed temperature of 98.6, respiration rate of 18, heart rate of 85, blood pressure 142/90, SpO2 of 98% on 3 L nasal cannula.  Per ED report, patient had low oxygen at home, and in triage she was 83% on room air, and 88% on 2 L nasal cannula.  Serum sodium is 140, potassium 4.4, chloride 106, bicarb 23, BUN of 51, serum creatinine of 6.10, EGFR 7, nonfasting blood glucose 140, WBC 8.1, hemoglobin 9.2, platelets of 100.  UA was negative for leukocytes and nitrates. BNP was 758.4.  Lactic acid was 1.2.  ED treatment: Azithromycin 500 mg IV one-time 5 doses ordered, ceftriaxone 2 g IV daily 5 doses ordered, sodium chloride 500 mL bolus. ------------------------- At bedside, patient was able to tell me her name, her location, and identify her daughter at bedside.  She reports that today she started having breathing difficulty.  She denies fever, cough, chest pain, abdominal pain, dysuria, hematuria, diarrhea, swelling of her lower extremities.  She endorses that she still makes urine as appropriate.  Patient and family denies known sick contacts.  Social history: She lives at home with her daughter, Alexis Tucker.  She denies tobacco, EtOH, recreational drug use.  ROS: Constitutional: no weight change, no fever ENT/Mouth: no sore throat, no  rhinorrhea Eyes: no eye pain, no vision changes Cardiovascular: no chest pain, no dyspnea,  no edema, no palpitations Respiratory: no cough, no sputum, no wheezing Gastrointestinal: no nausea, no vomiting, no diarrhea, no constipation Genitourinary: no urinary incontinence, no dysuria, no hematuria Musculoskeletal: no arthralgias, no myalgias Skin: no skin lesions, no pruritus, Neuro: + weakness, no loss of consciousness, no syncope Psych: no anxiety, no depression, + decrease appetite Heme/Lymph: no bruising, no bleeding  ED Course: Discussed with emergency medicine provider patient requiring hospitalization for chief concerns of acute hypoxic respiratory failure  Assessment/Plan  Principal Problem:   Acute hypoxemic respiratory failure (HCC) Active Problems:   HTN (hypertension)   Paroxysmal atrial fibrillation (HCC)   Type 2 diabetes mellitus with other specified complication (HCC)   Post-surgical hypothyroidism   GERD (gastroesophageal reflux disease)   Thrombocytopenia (HCC)   Anemia due to stage 5 chronic kidney disease, 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)   OA (osteoarthritis)   Diabetic retinopathy with macular edema (HCC)   History of arterial ischemic stroke   Osteoporosis   Malignant neoplasm of upper-outer quadrant of left breast in female, estrogen receptor positive (HCC)   Multilobar lung infiltrate   DNR (do not resuscitate)   Assessment and Plan:  * Acute hypoxemic respiratory failure (HCC) Presumed secondary to multilobar pneumonia Continue oxygen supplementation to maintain SpO2 greater than 92% Azithromycin 500 mg IV daily, ceftriaxone 2 g IV daily, 5 days ordered Admit to telemetry cardiac, inpatient  Paroxysmal atrial fibrillation (HCC) Carvedilol 12.5 mg p.o. twice daily, Eliquis 5 mg p.o. twice daily resumed  HTN (hypertension) Amlodipine 10 mg daily, carvedilol  12.5 mg p.o. daily, isosorbide mononitrate 30 mg daily resumed on admission Hydralazine 5 mg  IV every 8 hours as needed for SBP greater than 175, 4 days ordered  Multilobar lung infiltrate Continue with azithromycin and ceftriaxone 2 g IV daily to complete 5-day course Incentive spirometry every 2 hours while awake, flutter valve every 2 hours while awake Albuterol nebulizer 3 times daily, 2 doses ordered Respiratory team consulted to educate patient on appropriate incentive spirometry and flutter valve use Admit to telemetry cardiac, inpatient  Malignant neoplasm of upper-outer quadrant of left breast in female, estrogen receptor positive (HCC) Anastrozole 1 mg daily resumed  Chart reviewed.   DVT prophylaxis: Eliquis 5 mg p.o. twice daily Code Status: DNR/DNI, confirmed with patient and daughter at bedside Diet: Renal diet Family Communication: Updated daughter Alexis Tucker at bedside with patient's permission Disposition Plan: Pending clinical course Consults called: Respiratory care team Admission status: Telemetry cardiac, inpatient  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   a.) moderately differentiated IMC (G2, T1N0Mx); s/p lumpectomy + adjuvant XRT   Breast cancer, left (HCC) 11/27/2017   a.) IMC (mpT2 pN0, G2, ER/PR positive, HER-2/neu negative); b.) mammosite   Carotid stenosis, asymptomatic 01/10/2013   a.) carotid dopplers 01/10/2013, 02/27/2014, 09/10/2017, 07/26/2020: >50% BECA, 1-39% BICA   Cataracts, bilateral    Chronic diastolic CHF (congestive heart failure) (HCC)    a.) TTE 11/26/2007: EF 60%, LVH, triv TR/MR, AoV sclerosis with no stenosis, G1DD; b.) TTE 05/04/2016: EF 60-65%, LAE, G1DD; c.) TTE  11/16/2021: EF 55-60%, LVH, RVE, mild-mod LAE, mod MAC, mod MR, mild TR/AR, G2DD.   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   a.) Avastin injections as [facility]   DJD (degenerative joint disease)    ESRD (end stage renal disease) (HCC)    GERD (gastroesophageal reflux disease)    Glaucoma 03/2014   History of colon cancer 2006   a.) s/p colectomy   History of hepatitis A 1990s   from food, resolved   HTN (hypertension)    Hyperplastic colon polyp    Long term current use of anticoagulant    a.) apixaban   Long term current use of antithrombotics/antiplatelets    a.) pentoxifylline   Macular degeneration    wet R with vision loss, dry L; to see retina specialist   Neuropathy    OA (osteoarthritis)    Osteonecrosis (HCC)    hips?   PONV (postoperative nausea and vomiting)    Post-surgical hypothyroidism    Thrombocytopenia (HCC)    Tubular adenoma    Wears dentures    partial upper   Past Surgical History:  Procedure Laterality Date   A/V FISTULAGRAM Right 06/08/2022   Procedure: A/V Fistulagram;  Surgeon: Annice Needy, MD;  Location: [facility] INVASIVE CV LAB;  Service: Cardiovascular;  Laterality: Right;   ABDOMINAL  HYSTERECTOMY  1976   partial, after tubal pregnancy, h/o fibroids with heavy bleeding   AV FISTULA PLACEMENT Right 02/01/2022   Procedure: INSERTION OF ARTERIOVENOUS (AV) GORE-TEX GRAFT ARM (LEFT UPPER EXTREMITY);  Surgeon: Annice Needy, MD;  Location: [facility] ORS;  Service: Vascular;  Laterality: Right;   BREAST BIOPSY Left 2008   positive   BREAST BIOPSY Left 11/22/2017   2oclock 2cmfn wing shaped clip, INVASIVE MAMMARY CARCINOMA   BREAST BIOPSY Left 11/22/2017   2oclock 4cmfn coil shaped  clip, INVASIVE MAMMARY CARCINOMA   BREAST BIOPSY Left 11/22/2017   lymph node - butterfly hydroMARK   BREAST EXCISIONAL BIOPSY Left 01/07/2018   lumpectomy by Dr. Lemar Livings   BREAST LUMPECTOMY Left 2008   F/U radiation   BREAST LUMPECTOMY Left 2019   2 areas of [facility] at 2:00 position f/u with mammosite   BREAST LUMPECTOMY WITH SENTINEL LYMPH NODE BIOPSY Left 01/07/2018   Procedure: BREAST LUMPECTOMY WITH SENTINEL LYMPH NODE BX;  Surgeon: Earline Mayotte, MD;  Location: [facility] ORS;  Service: General;  Laterality: Left;   BREAST MAMMOSITE  2008   carotid ultrasound  12/2012   1-39% bilateral stenosis, severe in ECA   CATARACT EXTRACTION W/PHACO Left 08/14/2016   Procedure: CATARACT EXTRACTION PHACO AND INTRAOCULAR LENS PLACEMENT (IOC)  Left Diabetic;  Surgeon: Sherald Hess, MD;  Location: [facility];  Service: Ophthalmology;  Laterality: Left;  Diabetic - oral meds   CHOLECYSTECTOMY  2005   COLECTOMY  2005   colon cancer   COLON SURGERY  2006   bowel obstruction   COLONOSCOPY  2008   ext hem, ileo-colonic anastomosis in cecum, tubular adenoma x1, rec rpt 1 yr for multiple polyps (in [place])   COLONOSCOPY WITH PROPOFOL N/A 10/19/2021   2.5cm TVA with low grade dysplasia, diverticulosis, no rpt recommended [name], [name], MD)   ESOPHAGOGASTRODUODENOSCOPY N/A 05/06/2016   Procedure: ESOPHAGOGASTRODUODENOSCOPY (EGD);  Surgeon: Scot Jun, MD;  Location: [facility] ENDOSCOPY;  Service: Endoscopy;  Laterality: N/A;   EYE SURGERY     JOINT REPLACEMENT     THYROIDECTOMY, PARTIAL     unclear why   TOTAL KNEE ARTHROPLASTY Right 1999   UPPER EXTREMITY ANGIOGRAPHY Right 05/18/2022   Procedure: Upper Extremity Angiography;  Surgeon: Annice Needy, MD;  Location: [facility] INVASIVE CV LAB;  Service: Cardiovascular;  Laterality: Right;   US ECHOCARDIOGRAPHY  11/2007   nl LV fxn EF 60%, diastolic dysfunction, LVH, tr TR and MR, slcerotic aortic valve   VEIN LIGATION AND STRIPPING     Social  History:  reports that she has quit smoking. She has been exposed to tobacco smoke. She has never used smokeless tobacco. She reports that she does not drink alcohol and does not use drugs.  Allergies  Allergen Reactions   Tramadol Other (See Comments)    nightmares   Codeine Anxiety   Sulfa Antibiotics Itching and Rash   Family History  Problem Relation Age of Onset   Diabetes Mother    Hypertension Mother    Arthritis Mother    Alcohol abuse Father    Arthritis Father    Stroke Daughter    CAD Neg Hx    Cancer Neg Hx    Breast cancer Neg Hx    Family history: Family history reviewed and not pertinent.  Prior to Admission medications   Medication Sig Start Date End Date Taking? Authorizing Provider  allopurinol (ZYLOPRIM) 100 MG tablet Take 1 tablet (100 mg total) by mouth daily. For  gout 10/03/22   Eustaquio Boyden, MD  amLODipine (NORVASC) 10 MG tablet Take 10 mg by mouth daily. Patient not taking: Reported on 11/23/2022    Mosetta Pigeon, MD  anastrozole (ARIMIDEX) 1 MG tablet TAKE 1 TABLET BY MOUTH EVERY DAY 11/13/22   Earna Coder, MD  apixaban (ELIQUIS) 5 MG TABS tablet Take 1 tablet (5 mg total) by mouth 2 (two) times daily. As blood thinner 10/03/22   Eustaquio Boyden, MD  Blood Glucose Monitoring Suppl [facility] BLOOD GLUCOSE METER) W/DEVICE KIT Use as directed to check sugars daily 250.42 03/13/14   Eustaquio Boyden, MD  carvedilol (COREG) 25 MG tablet Take 0.5 tablets (12.5 mg total) by mouth 2 (two) times daily with a meal. 11/01/22   Debbe Odea, MD  Cholecalciferol (VITAMIN D-3) 5000 UNITS TABS Take 1 tablet by mouth daily.    [provider]  cloNIDine HCl (KAPVAY) 0.1 MG TB12 ER tablet Take 0.1 mg by mouth 2 (two) times daily. PRN    [provider]  isosorbide mononitrate (IMDUR) 30 MG 24 hr tablet Take 1 tablet (30 mg total) by mouth daily. 11/01/22 01/30/23  Debbe Odea, MD  levothyroxine (SYNTHROID) 88 MCG tablet Take 1 tablet (88  mcg total) by mouth daily. For hypothyroidism 10/03/22   Eustaquio Boyden, MD  linagliptin (TRADJENTA) 5 MG TABS tablet Take 1 tablet (5 mg total) by mouth daily. 07/10/22   Eustaquio Boyden, MD  pantoprazole (PROTONIX) 40 MG tablet Take 1 tablet (40 mg total) by mouth daily as needed. For reflux 10/03/22   Eustaquio Boyden, MD  sodium bicarbonate 650 MG tablet Take 1,300 mg by mouth 2 (two) times daily.    [provider]   Physical Exam: Vitals:   12/03/22 1154 12/03/22 1200 12/03/22 1201 12/03/22 1354  BP: (!) 160/64   (!) 142/90  Pulse: 88   87  Resp: 20   18  Temp: 99.8 F (37.7 C)   98.6 F (37 C)  TempSrc: Oral   Oral  SpO2: (!) 84% 95%  98%  Weight:   75.3 kg   Height:   5\' 3"  (1.6 m)    Constitutional: appears frail, calm Eyes: PERRL, lids and conjunctivae normal ENMT: Mucous membranes are moist. Posterior pharynx clear of any exudate or lesions. Age-appropriate dentition. Hearing appropriate Neck: normal, supple, no masses, no thyromegaly Respiratory: Decreased lung sounds on the left side on auscultation, no wheezing, no crackles.  Mild increase respiratory effort.  Mild use accessory muscle use.  2.5 L nasal cannula in place Cardiovascular: Regular rate and rhythm, no murmurs / rubs / gallops. No extremity edema. 2+ pedal pulses. No carotid bruits.  Abdomen: no tenderness, no masses palpated, no hepatosplenomegaly. Bowel sounds positive.  Musculoskeletal: no clubbing / cyanosis. No joint deformity upper and lower extremities. Good ROM, no contractures, no atrophy. Normal muscle tone.  Skin: no rashes, lesions, ulcers. No induration Neurologic: Sensation intact. Strength 5/5 in all 4.  Psychiatric: Normal judgment and insight. Alert and oriented x 3. Normal mood.   EKG: independently reviewed, showing sinus rhythm with rate of 87, QTc 433  Chest x-ray on Admission: I personally reviewed and I agree with radiologist reading as below.  DG Chest 2 View  Result Date:  12/03/2022 CLINICAL DATA:  82 year old female with shortness of breath. EXAM: CHEST - 2 VIEW COMPARISON:  CT chest 10/13/2022 and earlier. FINDINGS: PA and lateral views at 1219 hours demonstrate patchy, coarse, and confluent new left lung airspace opacity which appears to  affect both the lingula and lower lobe. Similar lung volumes to April. No associated pleural effusion. Pulmonary vascularity elsewhere does not appear significantly changed. No pneumothorax. No confluent right lung opacity. Stable cardiac size and mediastinal contours. Calcified aortic atherosclerosis. Extensive abdominal Calcified aortic atherosclerosis. No acute osseous abnormality identified. Negative visible bowel gas. IMPRESSION: 1. New left lung multilobar patchy and confluent opacity, suspicious for aspiration or pneumonia. Asymmetric edema considered but felt unlikely. No pleural effusion. 2. Right lung appears negative. 3. Aortic Atherosclerosis (ICD10-I70.0). Electronically Signed   By: Odessa Fleming M.D.   On: 12/03/2022 12:34    Labs on Admission: I have personally reviewed following labs  CBC: Recent Labs  Lab 12/03/22 1204  WBC 8.1  HGB 9.2*  HCT 29.1*  MCV 95.4  PLT 100*   Basic Metabolic Panel: Recent Labs  Lab 12/03/22 1204  NA 140  K 4.4  CL 106  CO2 23  GLUCOSE 140*  BUN 51*  CREATININE 6.10*  CALCIUM 8.9   GFR: Estimated Creatinine Clearance: 7.3 mL/min (A) (by C-G formula based on SCr of 6.1 mg/dL (H)).  Liver Function Tests: Recent Labs  Lab 12/03/22 1545  AST 16  ALT 10  ALKPHOS 51  BILITOT 1.2  PROT 7.1  ALBUMIN 3.8   Coagulation Profile: Recent Labs  Lab 12/03/22 1545  INR 2.6*   Urine analysis:    Component Value Date/Time   COLORURINE YELLOW (A) 12/03/2022 1545   APPEARANCEUR HAZY (A) 12/03/2022 1545   LABSPEC 1.012 12/03/2022 1545   PHURINE 7.0 12/03/2022 1545   GLUCOSEU 50 (A) 12/03/2022 1545   HGBUR SMALL (A) 12/03/2022 1545   BILIRUBINUR NEGATIVE 12/03/2022 1545    KETONESUR NEGATIVE 12/03/2022 1545   PROTEINUR >=300 (A) 12/03/2022 1545   NITRITE NEGATIVE 12/03/2022 1545   LEUKOCYTESUR NEGATIVE 12/03/2022 1545   This document was prepared using Dragon Voice Recognition software and may include unintentional dictation errors.  Dr. Sedalia Muta [facility]  If 7PM-7AM, please contact overnight-coverage provider If 7AM-7PM, please contact day attending provider [email or web address]  12/03/2022, 6:52 PM

## 2022-12-03 NOTE — Hospital Course (Addendum)
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  Will check a pulse ox on room air with ambulation.  Patient still undecided on dialysis overnight.  Creatinine worsened to 6.75 with a GFR of 6.  Repeat hemoglobin still low at 7.4. 6/6.  Patient had some abdominal pain today.  Abdominal x-ray read as negative.  Could be constipation related.  MiraLAX prescribed.  Will transfuse 1 unit of packed red blood cells on hemoglobin of 7.3.  Creatinine worsened to 7.13 and patient now agreeable to start dialysis.  PermCath will be placed tomorrow and dialysis will start tomorrow. 6/7.  No abdominal pain this morning.  Hemoglobin up to 8.6 with transfusion yesterday.  PermCath placement today.  Will start dialysis today. 6/8.  Hemoglobin up to 10.3.  Looks like came off oxygen this afternoon.  Second dialysis session today. 6/9.  Hemoglobin 9.3.  Off oxygen this morning.  Change Lasix to 40 mg on nondialysis days. 6/10.  Third dialysis session today.  Hemoglobin 9.9 and creatinine 4.66.  Will need outpatient dialysis slot and a rehab bed prior to disposition. 6/11.  Called to see patient this morning because she refused to take meds.  Reoriented.  Patient was able to speak with her daughter on the phone with nursing staff help.

## 2022-12-03 NOTE — ED Triage Notes (Signed)
Pt to ED with daughter POV from home for low SPO2. Daughter was giving her AM meds this morning and noticed pt breathing appeared labored, she checked SPO2 and it was 77% on room air, then 83% with deep breathing. They came directly here, SPO2 in triage noted to be 84% on RA, placed on 2L, still 88%, placed on 3L, 95%.  Lungs sound clear but diminished mostly on L side. Resp unlabored.  R arm has AV graft, is restricted. ESRD, no dialysis yet. CHF, DM also.

## 2022-12-03 NOTE — Assessment & Plan Note (Addendum)
On amlodipine and Coreg

## 2022-12-03 NOTE — Assessment & Plan Note (Deleted)
Continue with azithromycin and ceftriaxone 2 g IV daily to complete 5-day course Incentive spirometry every 2 hours while awake, flutter valve every 2 hours while awake Albuterol nebulizer 3 times daily, 2 doses ordered Respiratory team consulted to educate patient on appropriate incentive spirometry and flutter valve use Admit to telemetry cardiac, inpatient

## 2022-12-03 NOTE — Assessment & Plan Note (Signed)
On Coreg and Eliquis

## 2022-12-03 NOTE — Sepsis Progress Note (Signed)
Sepsis protocol is being followed by [facility]. 

## 2022-12-03 NOTE — Assessment & Plan Note (Signed)
Continue anastrozole  

## 2022-12-04 DIAGNOSIS — J9601 Acute respiratory failure with hypoxia: Secondary | ICD-10-CM | POA: Diagnosis not present

## 2022-12-04 LAB — BASIC METABOLIC PANEL
Anion gap: 12 (ref 5–15)
BUN: 52 mg/dL — ABNORMAL HIGH (ref 8–23)
CO2: 23 mmol/L (ref 22–32)
Calcium: 9 mg/dL (ref 8.9–10.3)
Chloride: 106 mmol/L (ref 98–111)
Creatinine, Ser: 5.95 mg/dL — ABNORMAL HIGH (ref 0.44–1.00)
GFR, Estimated: 7 mL/min — ABNORMAL LOW (ref >60)
Glucose, Bld: 146 mg/dL — ABNORMAL HIGH (ref 70–99)
Potassium: 4.6 mmol/L (ref 3.5–5.1)
Sodium: 141 mmol/L (ref 135–145)

## 2022-12-04 LAB — CBC
HCT: 27 % — ABNORMAL LOW (ref 36.0–46.0)
Hemoglobin: 8.5 g/dL — ABNORMAL LOW (ref 12.0–15.0)
MCH: 30.4 pg (ref 26.0–34.0)
MCHC: 31.5 g/dL (ref 30.0–36.0)
MCV: 96.4 fL (ref 80.0–100.0)
Platelets: 101 10*3/uL — ABNORMAL LOW (ref 150–400)
RBC: 2.8 MIL/uL — ABNORMAL LOW (ref 3.87–5.11)
RDW: 14.1 % (ref 11.5–15.5)
WBC: 10.6 10*3/uL — ABNORMAL HIGH (ref 4.0–10.5)
nRBC: 0 % (ref 0.0–0.2)

## 2022-12-04 LAB — PROCALCITONIN
Procalcitonin: 2.68 ng/mL
Procalcitonin: 2.75 ng/mL

## 2022-12-04 LAB — CULTURE, BLOOD (ROUTINE X 2): Special Requests: ADEQUATE

## 2022-12-04 MED ORDER — FUROSEMIDE 10 MG/ML IJ SOLN
40.0000 mg | Freq: Two times a day (BID) | INTRAMUSCULAR | Status: DC
  Administered 2022-12-04 – 2022-12-10 (×12): 40 mg via INTRAVENOUS
  Filled 2022-12-04 (×12): qty 4

## 2022-12-04 MED ORDER — APIXABAN 2.5 MG PO TABS
2.5000 mg | ORAL_TABLET | Freq: Two times a day (BID) | ORAL | Status: DC
  Administered 2022-12-04 – 2022-12-15 (×20): 2.5 mg via ORAL
  Filled 2022-12-04 (×21): qty 1

## 2022-12-04 MED ORDER — AZITHROMYCIN 500 MG PO TABS
250.0000 mg | ORAL_TABLET | ORAL | Status: DC
  Administered 2022-12-04: 250 mg via ORAL
  Filled 2022-12-04: qty 1

## 2022-12-04 NOTE — Progress Notes (Signed)
[facility]  ROUNDING NOTE   Subjective:   [name] was admitted to [facility] on 12/03/2022 for Acute hypoxemic respiratory failure [facility]) [J96.01]  Patient was  admitted to Hospice but now has decided she does not want hospice.   Last seen by Dr. Thedore Mins on 5/14. Patient was prescribed torsemide but she has not taken this.   Patient's daughter at bedside.   Objective:  Vital signs in last 24 hours:  Temp:  [98 F (36.7 C)-100.5 F (38.1 C)] 98.2 F (36.8 C) (06/03 1100) Pulse Rate:  [86-94] 92 (06/03 1100) Resp:  [18-20] 20 (06/03 1100) BP: (142-168)/(66-90) 144/66 (06/03 1100) SpO2:  [90 %-98 %] 90 % (06/03 1100)  Weight change:  Filed Weights   12/03/22 1201  Weight: 75.3 kg    Intake/Output: I/O last 3 completed shifts: In: 954.8 [IV Piggyback:954.8] Out: -    Intake/Output this shift:  No intake/output data recorded.  Physical Exam: General: NAD, laying in bed  Head: Normocephalic, atraumatic. Moist oral mucosal membranes  Eyes: Anicteric, PERRL  Neck: Supple, trachea midline  Lungs:  Clear to auscultation  Heart: Regular rate and rhythm  Abdomen:  Soft, nontender,   Extremities:  1+ peripheral edema.  Neurologic: Nonfocal, moving all four extremities  Skin: No lesions  Access: AVG - no bruit or thrill    Basic Metabolic Panel: Recent Labs  Lab 12/03/22 1204 12/04/22 0506  NA 140 141  K 4.4 4.6  CL 106 106  CO2 23 23  GLUCOSE 140* 146*  BUN 51* 52*  CREATININE 6.10* 5.95*  CALCIUM 8.9 9.0    Liver Function Tests: Recent Labs  Lab 12/03/22 1545  AST 16  ALT 10  ALKPHOS 51  BILITOT 1.2  PROT 7.1  ALBUMIN 3.8   No results for input(s): "LIPASE", "AMYLASE" in the last 168 hours. No results for input(s): "AMMONIA" in the last 168 hours.  CBC: Recent Labs  Lab 12/03/22 1204 12/04/22 0506  WBC 8.1 10.6*  HGB 9.2* 8.5*  HCT 29.1* 27.0*  MCV 95.4 96.4  PLT 100* 101*    Cardiac Enzymes: No results for  input(s): "CKTOTAL", "CKMB", "CKMBINDEX", "TROPONINI" in the last 168 hours.  BNP: Invalid input(s): "POCBNP"  CBG: No results for input(s): "GLUCAP" in the last 168 hours.  Microbiology: Results for orders placed or performed during the hospital encounter of 12/03/22  Resp panel by RT-PCR (RSV, Flu A&B, Covid) Anterior Nasal Swab     Status: None   Collection Time: 12/03/22  3:45 PM   Specimen: Anterior Nasal Swab  Result Value Ref Range Status   SARS Coronavirus 2 by RT PCR NEGATIVE NEGATIVE Final    Comment: (NOTE) SARS-CoV-2 target nucleic acids are NOT DETECTED.  The SARS-CoV-2 RNA is generally detectable in upper respiratory specimens during the acute phase of infection. The lowest concentration of SARS-CoV-2 viral copies this assay can detect is 138 copies/mL. A negative result does not preclude SARS-Cov-2 infection and should not be used as the sole basis for treatment or other patient management decisions. A negative result may occur with  improper specimen collection/handling, submission of specimen other than nasopharyngeal swab, presence of viral mutation(s) within the areas targeted by this assay, and inadequate number of viral copies(<138 copies/mL). A negative result must be combined with clinical observations, patient history, and epidemiological information. The expected result is Negative.  Fact Sheet for Patients:  BloggerCourse.com  Fact Sheet for Healthcare Providers:  SeriousBroker.it  This test is no t  yet approved or cleared by the [place] and  has been authorized for detection and/or diagnosis of SARS-CoV-2 by FDA under an Emergency Use Authorization (EUA). This EUA will remain  in effect (meaning this test can be used) for the duration of the COVID-19 declaration under Section 564(b)(1) of the Act, 21 U.S.C.section 360bbb-3(b)(1), unless the authorization is terminated  or revoked sooner.        Influenza A by PCR NEGATIVE NEGATIVE Final   Influenza B by PCR NEGATIVE NEGATIVE Final    Comment: (NOTE) The Xpert Xpress SARS-CoV-2/FLU/RSV plus assay is intended as an aid in the diagnosis of influenza from Nasopharyngeal swab specimens and should not be used as a sole basis for treatment. Nasal washings and aspirates are unacceptable for Xpert Xpress SARS-CoV-2/FLU/RSV testing.  Fact Sheet for Patients: BloggerCourse.com  Fact Sheet for Healthcare Providers: SeriousBroker.it  This test is not yet approved or cleared by the [place] FDA and has been authorized for detection and/or diagnosis of SARS-CoV-2 by FDA under an Emergency Use Authorization (EUA). This EUA will remain in effect (meaning this test can be used) for the duration of the COVID-19 declaration under Section 564(b)(1) of the Act, 21 U.S.C. section 360bbb-3(b)(1), unless the authorization is terminated or revoked.     Resp Syncytial Virus by PCR NEGATIVE NEGATIVE Final    Comment: (NOTE) Fact Sheet for Patients: BloggerCourse.com  Fact Sheet for Healthcare Providers: SeriousBroker.it  This test is not yet approved or cleared by the [place] FDA and has been authorized for detection and/or diagnosis of SARS-CoV-2 by FDA under an Emergency Use Authorization (EUA). This EUA will remain in effect (meaning this test can be used) for the duration of the COVID-19 declaration under Section 564(b)(1) of the Act, 21 U.S.C. section 360bbb-3(b)(1), unless the authorization is terminated or revoked.  Performed at [facility], [address] [place], [place] [address]   Blood Culture (routine x 2)     Status: None (Preliminary result)   Collection Time: 12/03/22  3:45 PM   Specimen: BLOOD  Result Value Ref Range Status   Specimen Description BLOOD BLOOD LEFT ARM  Final   Special  Requests   Final    BOTTLES DRAWN AEROBIC AND ANAEROBIC Blood Culture adequate volume   Culture   Final    NO GROWTH < 24 HOURS Performed at [facility], [address] [place], [place] [address]    Report Status PENDING  Incomplete  Blood Culture (routine x 2)     Status: None (Preliminary result)   Collection Time: 12/03/22  4:30 PM   Specimen: BLOOD  Result Value Ref Range Status   Specimen Description BLOOD BLOOD LEFT ARM  Final   Special Requests   Final    BOTTLES DRAWN AEROBIC AND ANAEROBIC Blood Culture adequate volume   Culture   Final    NO GROWTH < 24 HOURS Performed at [facility], [address], [place], [place] [address]    Report Status PENDING  Incomplete    Coagulation Studies: Recent Labs    12/03/22 1545  LABPROT 28.2*  INR 2.6*    Urinalysis: Recent Labs    12/03/22 1545  COLORURINE YELLOW*  LABSPEC 1.012  PHURINE 7.0  GLUCOSEU 50*  HGBUR SMALL*  BILIRUBINUR NEGATIVE  KETONESUR NEGATIVE  PROTEINUR >=300*  NITRITE NEGATIVE  LEUKOCYTESUR NEGATIVE      Imaging: DG Chest 2 View  Result Date: 12/03/2022 CLINICAL DATA:  82 year old female with shortness of  breath. EXAM: CHEST - 2 VIEW COMPARISON:  CT chest 10/13/2022 and earlier. FINDINGS: PA and lateral views at 1219 hours demonstrate patchy, coarse, and confluent new left lung airspace opacity which appears to affect both the lingula and lower lobe. Similar lung volumes to April. No associated pleural effusion. Pulmonary vascularity elsewhere does not appear significantly changed. No pneumothorax. No confluent right lung opacity. Stable cardiac size and mediastinal contours. Calcified aortic atherosclerosis. Extensive abdominal Calcified aortic atherosclerosis. No acute osseous abnormality identified. Negative visible bowel gas. IMPRESSION: 1. New left lung multilobar patchy and confluent opacity, suspicious for aspiration or pneumonia. Asymmetric edema considered but felt  unlikely. No pleural effusion. 2. Right lung appears negative. 3. Aortic Atherosclerosis (ICD10-I70.0). Electronically Signed   By: Odessa Fleming M.D.   On: 12/03/2022 12:34     Medications:    cefTRIAXone (ROCEPHIN)  IV Stopped (12/03/22 1708)    allopurinol  100 mg Oral Daily   amLODipine  10 mg Oral Daily   anastrozole  1 mg Oral Daily   apixaban  5 mg Oral BID   azithromycin  250 mg Oral Q24H   carvedilol  12.5 mg Oral BID WC   isosorbide mononitrate  30 mg Oral Daily   levothyroxine  88 mcg Oral QAC breakfast   pantoprazole  40 mg Oral QHS   sodium bicarbonate  1,300 mg Oral BID   acetaminophen **OR** acetaminophen, hydrALAZINE, ondansetron **OR** ondansetron (ZOFRAN) IV, senna-docusate  Assessment/ Plan:  [name] is a 82 y.o.  female with breast cancer,  hypertension, hypothyroidism, GERD, atrial fibrillation, who was originally admitted to [facility] on 12/03/2022 for Acute hypoxemic respiratory failure (HCC) [J96.01]  Chronic kidney disease stage V: no acute indication for dialysis.  - start furosemide IV.  - check hep B labs - continue sodium bicarb - AVG nonfunctioning, will need tunneled catheter if dialysis is required.   Hypertension with chronic kidney disease: continue carvedilol, isosorbide mononitrate and amlodipine.   Anemia of chronic kidney disease: hemoglobin 8.5. Not currently getting ESA due to active malignancy.   Acute exacerbation of chronic congestive heart failure - IV furosemide   LOS: 1 Pecolia Marando 6/3/20241:30 PM

## 2022-12-04 NOTE — Progress Notes (Signed)
[facility]  - [place] at [facility]   PATIENT NAME: Alexis Tucker    MR#:  [id number]  DATE OF BIRTH:  31-Jul-1940  SUBJECTIVE:  patient's daughter Alexis Tucker at bedside. Came in with increasing shortness of breath. No fever at home. Patient was on hospice per daughter patient has withdrawn from it.  Patient has been getting with noted even with talking. No recent travel or illness.   VITALS:  Blood pressure (!) 142/86, pulse 80, temperature 98.7 F (37.1 C), temperature source Oral, resp. rate (!) 28, height 5\' 3"  (1.6 m), weight 75.3 kg, SpO2 (!) 86 %.  PHYSICAL EXAMINATION:   GENERAL:  82 y.o.-year-old patient with no acute distress.  LUNGS: decreased breath sounds bilaterally, no wheezing, rales+ CARDIOVASCULAR: S1, S2 normal. No murmur   ABDOMEN: Soft, nontender, nondistended. Bowel sounds present.  EXTREMITIES: +  edema b/l.    NEUROLOGIC: nonfocal  patient is alert and awake SKIN: No obvious rash, lesion, or ulcer.   LABORATORY PANEL:  CBC Recent Labs  Lab 12/04/22 0506  WBC 10.6*  HGB 8.5*  HCT 27.0*  PLT 101*    Chemistries  Recent Labs  Lab 12/03/22 1545 12/04/22 0506  NA  --  141  K  --  4.6  CL  --  106  CO2  --  23  GLUCOSE  --  146*  BUN  --  52*  CREATININE  --  5.95*  CALCIUM  --  9.0  AST 16  --   ALT 10  --   ALKPHOS 51  --   BILITOT 1.2  --    Cardiac Enzymes No results for input(s): "TROPONINI" in the last 168 hours. RADIOLOGY:  DG Chest 2 View  Result Date: 12/03/2022 CLINICAL DATA:  82 year old female with shortness of breath. EXAM: CHEST - 2 VIEW COMPARISON:  CT chest 10/13/2022 and earlier. FINDINGS: PA and lateral views at 1219 hours demonstrate patchy, coarse, and confluent new left lung airspace opacity which appears to affect both the lingula and lower lobe. Similar lung volumes to April. No associated pleural effusion. Pulmonary vascularity elsewhere does not appear significantly changed. No pneumothorax. No confluent  right lung opacity. Stable cardiac size and mediastinal contours. Calcified aortic atherosclerosis. Extensive abdominal Calcified aortic atherosclerosis. No acute osseous abnormality identified. Negative visible bowel gas. IMPRESSION: 1. New left lung multilobar patchy and confluent opacity, suspicious for aspiration or pneumonia. Asymmetric edema considered but felt unlikely. No pleural effusion. 2. Right lung appears negative. 3. Aortic Atherosclerosis (ICD10-I70.0). Electronically Signed   By: Alexis Tucker M.D.   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    Acute hypoxemic respiratory failure (HCC) acute on chronic congestive heart failure diastolic Presumed secondary to ? Pneumonia, pulmonary edema/congestive heart failure acute on chronic diastolic Continue oxygen supplementation to maintain SpO2 greater than 92% -- patient's Pro calcitonin has been chronically elevated. Clinically she is does not seem to be like pneumonia. -- I will continue antibiotic for now change to oral tomorrow for few days. -- IV Lasix per Dr. Wynelle Link. -- Wean oxygen down. Assess for home oxygen.  Paroxysmal atrial fibrillation (HCC) --Carvedilol, Eliquis    HTN (hypertension) --continue home meds   CKD stage V --  patient follows with nephrology Dr. Thedore Mins -- seen by Dr. Wynelle Link. No indication for dialysis. -- Patient has had a right AV graft which failed secondary to hematoma and nonfunctioning.   Malignant neoplasm of upper-outer quadrant of left breast in female, estrogen receptor positive (HCC) --Anastrozole 1 mg daily resumed  PT/OT to see pt   Procedures: Family communication :dter at  bedside Consults :Renal CODE STATUS: DNR DVT Prophylaxis :eliquis Level of care: Telemetry Cardiac Status is: Inpatient Remains inpatient appropriate because: CHF    TOTAL TIME TAKING CARE OF THIS PATIENT: 35 minutes.  >50% time spent on counselling and coordination of care  Note: This dictation was prepared with Dragon dictation along with smaller phrase technology. Any transcriptional errors that result from this process are unintentional.  Alexis Tucker M.D    [facility]   CC: Primary care physician; Alexis Boyden, MD

## 2022-12-05 DIAGNOSIS — J9601 Acute respiratory failure with hypoxia: Secondary | ICD-10-CM | POA: Diagnosis not present

## 2022-12-05 LAB — URINALYSIS, COMPLETE (UACMP) WITH MICROSCOPIC
Bilirubin Urine: NEGATIVE
Glucose, UA: 50 mg/dL — AB
Ketones, ur: NEGATIVE mg/dL
Leukocytes,Ua: NEGATIVE
Nitrite: NEGATIVE
Protein, ur: 100 mg/dL — AB
Specific Gravity, Urine: 1.011 (ref 1.005–1.030)
pH: 5 (ref 5.0–8.0)

## 2022-12-05 LAB — CBC WITH DIFFERENTIAL/PLATELET
Abs Immature Granulocytes: 0.03 10*3/uL (ref 0.00–0.07)
Basophils Absolute: 0 10*3/uL (ref 0.0–0.1)
Basophils Relative: 0 %
Eosinophils Absolute: 0.1 10*3/uL (ref 0.0–0.5)
Eosinophils Relative: 1 %
HCT: 23.1 % — ABNORMAL LOW (ref 36.0–46.0)
Hemoglobin: 7.3 g/dL — ABNORMAL LOW (ref 12.0–15.0)
Immature Granulocytes: 0 %
Lymphocytes Relative: 9 %
Lymphs Abs: 0.7 10*3/uL (ref 0.7–4.0)
MCH: 29.8 pg (ref 26.0–34.0)
MCHC: 31.6 g/dL (ref 30.0–36.0)
MCV: 94.3 fL (ref 80.0–100.0)
Monocytes Absolute: 0.7 10*3/uL (ref 0.1–1.0)
Monocytes Relative: 8 %
Neutro Abs: 7 10*3/uL (ref 1.7–7.7)
Neutrophils Relative %: 82 %
Platelets: 86 10*3/uL — ABNORMAL LOW (ref 150–400)
RBC: 2.45 MIL/uL — ABNORMAL LOW (ref 3.87–5.11)
RDW: 14 % (ref 11.5–15.5)
Smear Review: NORMAL
WBC: 8.5 10*3/uL (ref 4.0–10.5)
nRBC: 0 % (ref 0.0–0.2)

## 2022-12-05 LAB — IRON AND TIBC
Iron: 12 ug/dL — ABNORMAL LOW (ref 28–170)
Saturation Ratios: 7 % — ABNORMAL LOW (ref 10.4–31.8)
TIBC: 185 ug/dL — ABNORMAL LOW (ref 250–450)
UIBC: 173 ug/dL

## 2022-12-05 LAB — RENAL FUNCTION PANEL
Albumin: 3.2 g/dL — ABNORMAL LOW (ref 3.5–5.0)
Anion gap: 13 (ref 5–15)
BUN: 58 mg/dL — ABNORMAL HIGH (ref 8–23)
CO2: 21 mmol/L — ABNORMAL LOW (ref 22–32)
Calcium: 8.9 mg/dL (ref 8.9–10.3)
Chloride: 107 mmol/L (ref 98–111)
Creatinine, Ser: 6.22 mg/dL — ABNORMAL HIGH (ref 0.44–1.00)
GFR, Estimated: 6 mL/min — ABNORMAL LOW (ref >60)
Glucose, Bld: 115 mg/dL — ABNORMAL HIGH (ref 70–99)
Phosphorus: 4.8 mg/dL — ABNORMAL HIGH (ref 2.5–4.6)
Potassium: 4.3 mmol/L (ref 3.5–5.1)
Sodium: 141 mmol/L (ref 135–145)

## 2022-12-05 LAB — PROTEIN / CREATININE RATIO, URINE
Creatinine, Urine: 99 mg/dL
Protein Creatinine Ratio: 1.36 mg/mg{Cre} — ABNORMAL HIGH (ref 0.00–0.15)
Total Protein, Urine: 135 mg/dL

## 2022-12-05 LAB — HEPATITIS B CORE ANTIBODY, TOTAL: Hep B Core Total Ab: REACTIVE — AB

## 2022-12-05 LAB — FERRITIN: Ferritin: 209 ng/mL (ref 11–307)

## 2022-12-05 LAB — HEPATITIS B SURFACE ANTIGEN: Hepatitis B Surface Ag: NONREACTIVE

## 2022-12-05 LAB — HEPATITIS B CORE ANTIBODY, IGM: Hep B C IgM: NONREACTIVE

## 2022-12-05 LAB — CULTURE, BLOOD (ROUTINE X 2)

## 2022-12-05 MED ORDER — AZITHROMYCIN 500 MG PO TABS
250.0000 mg | ORAL_TABLET | Freq: Every day | ORAL | Status: DC
  Administered 2022-12-05: 250 mg via ORAL
  Filled 2022-12-05: qty 1

## 2022-12-05 NOTE — Evaluation (Signed)
Physical Therapy Evaluation Patient Details Name: Alexis Tucker MRN: [id number] DOB: 04-May-1941 Today's Date: 12/05/2022  History of Present Illness  Patient is a 82 year old female presenting with concerns of labored breathing, short of breath, hypoxia. 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  Clinical Impression  Patient is agreeable to PT. 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 She reports using a cane for ambulation and lives with her daughter.  She reports feeling much better today than yesterday. Today she required Min A for transfer from and from bed side commode. Activity tolerance limited by fatigue. Sp02 decreased to 82% with activity on 4 L02 and increased to 100% at rest. Educated on breathing techniques. Patient is hopeful to return home soon. Anticipate the need for intermittent supervision/assistance at discharge. PT will continue to follow to maximize independence and facilitate return to prior level of function.      Recommendations for follow up therapy are one component of a multi-disciplinary discharge planning process, led by the attending physician.  Recommendations may be updated based on patient status, additional functional criteria and insurance authorization.  Follow Up Recommendations       Assistance Recommended at Discharge Intermittent Supervision/Assistance  Patient can return home with the following  A little help with walking and/or transfers;A little help with bathing/dressing/bathroom;Assistance with cooking/housework;Help with stairs or ramp for entrance;Assist for transportation    Equipment Recommendations None recommended by PT  Recommendations for Other Services       Functional Status Assessment Patient has had a recent decline in their functional status and demonstrates the ability to make significant  improvements in function in a reasonable and predictable amount of time.     Precautions / Restrictions Precautions Precautions: Fall Precaution Comments: vision impaired Restrictions Weight Bearing Restrictions: No      Mobility  Bed Mobility Overal bed mobility: Needs Assistance Bed Mobility: Sit to Supine, Supine to Sit     Supine to sit: Min guard Sit to supine: Min guard   General bed mobility comments: increased time required for mobility tasks.    Transfers Overall transfer level: Needs assistance Equipment used: 1 person hand held assist Transfers: Bed to chair/wheelchair/BSC, Sit to/from Stand Sit to Stand: Min guard (using bed rail on the left UE for support)           General transfer comment: patient required steadying assistance for stand pivot transfer to and from bed side commode. cues for safety and due to impaired vision    Ambulation/Gait               General Gait Details: not attempted as patient feeling fatigued after minimal activity  Stairs            Wheelchair Mobility    Modified Rankin (Stroke Patients Only)       Balance Overall balance assessment: Needs assistance Sitting-balance support: Feet supported Sitting balance-Leahy Scale: Good Sitting balance - Comments: patient is able to reach outside base of support with no loss of balance   Standing balance support: Single extremity supported Standing balance-Leahy Scale: Fair                               Pertinent Vitals/Pain Pain Assessment Pain Assessment: No/denies pain    Home Living Family/patient expects to be discharged to:: Private residence Living Arrangements:  Children (daughter) Available Help at Discharge: Family Type of Home: House Home Access: Level entry         Home Equipment: Agricultural consultant (2 wheels);Cane - single point;Shower seat      Prior Function Prior Level of Function : Needs assist             Mobility  Comments: Mod I using SPC. can typically walk to the bathroom at home ADLs Comments: intermittent assistance from duaghter as needed     Hand Dominance        Extremity/Trunk Assessment   Upper Extremity Assessment Upper Extremity Assessment: Generalized weakness    Lower Extremity Assessment Lower Extremity Assessment: Generalized weakness       Communication   Communication: No difficulties  Cognition Arousal/Alertness: Awake/alert Behavior During Therapy: WFL for tasks assessed/performed Overall Cognitive Status: Within Functional Limits for tasks assessed                                          General Comments General comments (skin integrity, edema, etc.): Sp02 down to 82% briefly with transfers on 4 L02. Sp02 100% at rest. cues for breathing techniques with carry over demonstrated.    Exercises     Assessment/Plan    PT Assessment Patient needs continued PT services  PT Problem List Decreased strength;Decreased range of motion;Decreased activity tolerance;Decreased balance;Decreased mobility;Decreased safety awareness       PT Treatment Interventions DME instruction;Gait training;Stair training;Functional mobility training;Therapeutic activities;Therapeutic exercise;Balance training;Cognitive remediation;Neuromuscular re-education;Patient/family education    PT Goals (Current goals can be found in the Care Plan section)  Acute Rehab PT Goals Patient Stated Goal: to go home PT Goal Formulation: With patient Time For Goal Achievement: 12/19/22 Potential to Achieve Goals: Fair    Frequency Min 3X/week     Co-evaluation               AM-PAC PT "6 Clicks" Mobility  Outcome Measure Help needed turning from your back to your side while in a flat bed without using bedrails?: None Help needed moving from lying on your back to sitting on the side of a flat bed without using bedrails?: A Little Help needed moving to and from a bed to a  chair (including a wheelchair)?: A Little Help needed standing up from a chair using your arms (e.g., wheelchair or bedside chair)?: A Little Help needed to walk in hospital room?: A Lot Help needed climbing 3-5 steps with a railing? : A Lot 6 Click Score: 17    End of Session Equipment Utilized During Treatment: Oxygen Activity Tolerance: Patient limited by fatigue Patient left: in bed;with call bell/phone within reach;with bed alarm set Nurse Communication: Mobility status PT Visit Diagnosis: Muscle weakness (generalized) (M62.81);Unsteadiness on feet (R26.81)    Time: 9604-5409 PT Time Calculation (min) (ACUTE ONLY): 19 min   Charges:   PT Evaluation $PT Eval Low Complexity: 1 Low PT Treatments $Therapeutic Activity: 8-22 mins        Donna Bernard, PT, MPT   Ina Homes 12/05/2022, 10:16 AM

## 2022-12-05 NOTE — ED Notes (Signed)
Patient Transport at this time.

## 2022-12-05 NOTE — ED Notes (Signed)
Physical Therapist at bedside.

## 2022-12-05 NOTE — Progress Notes (Signed)
[facility]  - [place] at [facility]   PATIENT NAME: Alexis Tucker    MR#:  [id number]  DATE OF BIRTH:  April 10, 1941  SUBJECTIVE:  No family at bedside. Came in with increasing shortness of breath. No fever at home. Patient was on hospice per daughter patient has withdrawn from it. Patient feels a little bit better. Not much urine output.   VITALS:  Blood pressure 127/60, pulse 95, temperature 98.8 F (37.1 C), temperature source Oral, resp. rate (!) 25, height 5\' 3"  (1.6 m), weight 75.3 kg, SpO2 94 %.  PHYSICAL EXAMINATION:   GENERAL:  82 y.o.-year-old patient with no acute distress.  LUNGS: decreased breath sounds bilaterally, no wheezing, rales+ CARDIOVASCULAR: S1, S2 normal. No murmur   ABDOMEN: Soft, nontender, nondistended. Bowel sounds present.  EXTREMITIES: ++ edema b/l.    NEUROLOGIC: nonfocal  patient is alert and awake    LABORATORY PANEL:  CBC Recent Labs  Lab 12/05/22 0629  WBC 8.5  HGB 7.3*  HCT 23.1*  PLT 86*     Chemistries  Recent Labs  Lab 12/03/22 1545 12/04/22 0506 12/05/22 0629  NA  --    < > 141  K  --    < > 4.3  CL  --    < > 107  CO2  --    < > 21*  GLUCOSE  --    < > 115*  BUN  --    < > 58*  CREATININE  --    < > 6.22*  CALCIUM  --    < > 8.9  AST 16  --   --   ALT 10  --   --   ALKPHOS 51  --   --   BILITOT 1.2  --   --    < > = values in this interval not displayed.    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    Acute hypoxemic respiratory failure (HCC) acute on chronic congestive heart failure diastolic/volume overload/CKD stage V Presumed secondary to , pulmonary edema/congestive heart failure acute on chronic  diastolic Continue oxygen supplementation to maintain SpO2 greater than 92% -- patient's Pro calcitonin has been chronically elevated. Clinically she is does not seem to be like pneumonia.d/c abxs for now. -- IV Lasix per Dr. Wynelle Link.Not much UOP -- Wean oxygen down.  -- Patient not diaries in well. Dr. Wynelle Link to discuss with patient and daughter to see if they would want to pursue dialysis.  Paroxysmal atrial fibrillation (HCC) --Carvedilol, Eliquis    HTN (hypertension) --continue home meds   CKD stage V -- patient follows with nephrology Dr. Thedore Mins -- seen by Dr. Wynelle Link. No indication for dialysis. -- Patient has had a right AV graft which failed secondary to hematoma and nonfunctioning.   Malignant neoplasm of upper-outer quadrant of left breast in female, estrogen receptor positive (HCC) --Anastrozole 1 mg daily resumed  PT/OT input appreciated   Procedures: Family communication :none today Consults :Renal CODE STATUS: DNR DVT Prophylaxis :eliquis Level of care: Telemetry Cardiac Status is: Inpatient Remains inpatient appropriate because: CHF    TOTAL TIME TAKING CARE OF THIS PATIENT: 35 minutes.  >50% time spent on counselling and coordination of care  Note: This dictation was  prepared with Dragon dictation along with smaller phrase technology. Any transcriptional errors that result from this process are unintentional.  Enedina Finner M.D    [facility]   CC: Primary care physician; Eustaquio Boyden, MD

## 2022-12-05 NOTE — Progress Notes (Signed)
[facility]  ROUNDING NOTE   Subjective:   [name] was admitted to [facility] on 12/03/2022 for Acute hypoxemic respiratory failure (HCC) [J96.01]  Patient laying in bed  Alert and oriented Partially completed breakfast tray at bedside States she feels well   Creatinine 6.22 UOP overnight   Objective:  Vital signs in last 24 hours:  Temp:  [98.4 F (36.9 C)-99.1 F (37.3 C)] 98.8 F (37.1 C) (06/04 1526) Pulse Rate:  [79-115] 100 (06/04 1526) Resp:  [14-39] 14 (06/04 1526) BP: (105-147)/(53-98) 111/53 (06/04 1526) SpO2:  [89 %-99 %] 99 % (06/04 1526)  Weight change:  Filed Weights   12/03/22 1201  Weight: 75.3 kg    Intake/Output: I/O last 3 completed shifts: In: 354.8 [IV Piggyback:354.8] Out: 100 [Urine:100]   Intake/Output this shift:  No intake/output data recorded.  Physical Exam: General: NAD, laying in bed  Head: Normocephalic, atraumatic. Moist oral mucosal membranes  Eyes: Anicteric  Lungs:  Clear to auscultation, [place] O2  Heart: Regular rate and rhythm  Abdomen:  Soft, nontender,   Extremities:  No peripheral edema.  Neurologic: Nonfocal, moving all four extremities  Skin: No lesions  Access: AVG - no bruit or thrill    Basic Metabolic Panel: Recent Labs  Lab 12/03/22 1204 12/04/22 0506 12/05/22 0629  NA 140 141 141  K 4.4 4.6 4.3  CL 106 106 107  CO2 23 23 21*  GLUCOSE 140* 146* 115*  BUN 51* 52* 58*  CREATININE 6.10* 5.95* 6.22*  CALCIUM 8.9 9.0 8.9  PHOS  --   --  4.8*     Liver Function Tests: Recent Labs  Lab 12/03/22 1545 12/05/22 0629  AST 16  --   ALT 10  --   ALKPHOS 51  --   BILITOT 1.2  --   PROT 7.1  --   ALBUMIN 3.8 3.2*    No results for input(s): "LIPASE", "AMYLASE" in the last 168 hours. No results for input(s): "AMMONIA" in the last 168 hours.  CBC: Recent Labs  Lab 12/03/22 1204 12/04/22 0506 12/05/22 0629  WBC 8.1 10.6* 8.5  NEUTROABS  --   --  7.0  HGB 9.2* 8.5* 7.3*   HCT 29.1* 27.0* 23.1*  MCV 95.4 96.4 94.3  PLT 100* 101* 86*     Cardiac Enzymes: No results for input(s): "CKTOTAL", "CKMB", "CKMBINDEX", "TROPONINI" in the last 168 hours.  BNP: Invalid input(s): "POCBNP"  CBG: No results for input(s): "GLUCAP" in the last 168 hours.  Microbiology: Results for orders placed or performed during the hospital encounter of 12/03/22  Resp panel by RT-PCR (RSV, Flu A&B, Covid) Anterior Nasal Swab     Status: None   Collection Time: 12/03/22  3:45 PM   Specimen: Anterior Nasal Swab  Result Value Ref Range Status   SARS Coronavirus 2 by RT PCR NEGATIVE NEGATIVE Final    Comment: (NOTE) SARS-CoV-2 target nucleic acids are NOT DETECTED.  The SARS-CoV-2 RNA is generally detectable in upper respiratory specimens during the acute phase of infection. The lowest concentration of SARS-CoV-2 viral copies this assay can detect is 138 copies/mL. A negative result does not preclude SARS-Cov-2 infection and should not be used as the sole basis for treatment or other patient management decisions. A negative result may occur with  improper specimen collection/handling, submission of specimen other than nasopharyngeal swab, presence of viral mutation(s) within the areas targeted by this assay, and inadequate number of viral copies(<138 copies/mL). A negative result  must be combined with clinical observations, patient history, and epidemiological information. The expected result is Negative.  Fact Sheet for Patients:  BloggerCourse.com  Fact Sheet for Healthcare Providers:  SeriousBroker.it  This test is no t yet approved or cleared by the [place] FDA and  has been authorized for detection and/or diagnosis of SARS-CoV-2 by FDA under an Emergency Use Authorization (EUA). This EUA will remain  in effect (meaning this test can be used) for the duration of the COVID-19 declaration under Section 564(b)(1)  of the Act, 21 U.S.C.section 360bbb-3(b)(1), unless the authorization is terminated  or revoked sooner.       Influenza A by PCR NEGATIVE NEGATIVE Final   Influenza B by PCR NEGATIVE NEGATIVE Final    Comment: (NOTE) The Xpert Xpress SARS-CoV-2/FLU/RSV plus assay is intended as an aid in the diagnosis of influenza from Nasopharyngeal swab specimens and should not be used as a sole basis for treatment. Nasal washings and aspirates are unacceptable for Xpert Xpress SARS-CoV-2/FLU/RSV testing.  Fact Sheet for Patients: BloggerCourse.com  Fact Sheet for Healthcare Providers: SeriousBroker.it  This test is not yet approved or cleared by the [place] FDA and has been authorized for detection and/or diagnosis of SARS-CoV-2 by FDA under an Emergency Use Authorization (EUA). This EUA will remain in effect (meaning this test can be used) for the duration of the COVID-19 declaration under Section 564(b)(1) of the Act, 21 U.S.C. section 360bbb-3(b)(1), unless the authorization is terminated or revoked.     Resp Syncytial Virus by PCR NEGATIVE NEGATIVE Final    Comment: (NOTE) Fact Sheet for Patients: BloggerCourse.com  Fact Sheet for Healthcare Providers: SeriousBroker.it  This test is not yet approved or cleared by the [place] FDA and has been authorized for detection and/or diagnosis of SARS-CoV-2 by FDA under an Emergency Use Authorization (EUA). This EUA will remain in effect (meaning this test can be used) for the duration of the COVID-19 declaration under Section 564(b)(1) of the Act, 21 U.S.C. section 360bbb-3(b)(1), unless the authorization is terminated or revoked.  Performed at [facility], [address] [place], [place] [address]   Blood Culture (routine x 2)     Status: None (Preliminary result)   Collection Time: 12/03/22  3:45 PM   Specimen:  BLOOD  Result Value Ref Range Status   Specimen Description BLOOD BLOOD LEFT ARM  Final   Special Requests   Final    BOTTLES DRAWN AEROBIC AND ANAEROBIC Blood Culture adequate volume   Culture   Final    NO GROWTH 2 DAYS Performed at [facility], [address], [place], [place] [address]    Report Status PENDING  Incomplete  Blood Culture (routine x 2)     Status: None (Preliminary result)   Collection Time: 12/03/22  4:30 PM   Specimen: BLOOD  Result Value Ref Range Status   Specimen Description BLOOD BLOOD LEFT ARM  Final   Special Requests   Final    BOTTLES DRAWN AEROBIC AND ANAEROBIC Blood Culture adequate volume   Culture   Final    NO GROWTH 2 DAYS Performed at [facility], [address], [place], [place] [address]    Report Status PENDING  Incomplete    Coagulation Studies: Recent Labs    12/03/22 1545  LABPROT 28.2*  INR 2.6*     Urinalysis: Recent Labs    12/03/22 1545 12/05/22 1344  COLORURINE YELLOW* YELLOW*  LABSPEC 1.012 1.011  PHURINE 7.0 5.0  GLUCOSEU  50* 50*  HGBUR SMALL* SMALL*  BILIRUBINUR NEGATIVE NEGATIVE  KETONESUR NEGATIVE NEGATIVE  PROTEINUR >=300* 100*  NITRITE NEGATIVE NEGATIVE  LEUKOCYTESUR NEGATIVE NEGATIVE       Imaging: No results found.   Medications:      allopurinol  100 mg Oral Daily   amLODipine  10 mg Oral Daily   anastrozole  1 mg Oral Daily   apixaban  2.5 mg Oral BID   carvedilol  12.5 mg Oral BID WC   furosemide  40 mg Intravenous BID   isosorbide mononitrate  30 mg Oral Daily   levothyroxine  88 mcg Oral QAC breakfast   pantoprazole  40 mg Oral QHS   sodium bicarbonate  1,300 mg Oral BID   acetaminophen **OR** acetaminophen, hydrALAZINE, ondansetron **OR** ondansetron (ZOFRAN) IV, senna-docusate  Assessment/ Plan:  [name] is a 82 y.o.  female with breast cancer,  hypertension, hypothyroidism, GERD, atrial fibrillation, who was originally admitted to [facility] on  12/03/2022 for Acute hypoxemic respiratory failure (HCC) [J96.01]  Chronic kidney disease stage V: no acute indication for dialysis.  -Continue IV Furosemide 40mg  BID - IVF - Creatinine continues to worsen - Agreeable to dialysis, if needed.  - AVG nonfunctioning, will need tunneled catheter if dialysis is required.   Hypertension with chronic kidney disease: continue carvedilol, isosorbide mononitrate and amlodipine.   Anemia of chronic kidney disease: hemoglobin 8.5. Not currently getting ESA due to active malignancy.   Acute exacerbation of chronic congestive heart failure - Continue IV furosemide   LOS: 2   6/4/20243:32 PM

## 2022-12-06 DIAGNOSIS — N189 Chronic kidney disease, unspecified: Secondary | ICD-10-CM

## 2022-12-06 DIAGNOSIS — Z170 Estrogen receptor positive status [ER+]: Secondary | ICD-10-CM

## 2022-12-06 DIAGNOSIS — N179 Acute kidney failure, unspecified: Secondary | ICD-10-CM | POA: Diagnosis not present

## 2022-12-06 DIAGNOSIS — J189 Pneumonia, unspecified organism: Secondary | ICD-10-CM

## 2022-12-06 DIAGNOSIS — I10 Essential (primary) hypertension: Secondary | ICD-10-CM

## 2022-12-06 DIAGNOSIS — C50412 Malignant neoplasm of upper-outer quadrant of left female breast: Secondary | ICD-10-CM

## 2022-12-06 DIAGNOSIS — E1169 Type 2 diabetes mellitus with other specified complication: Secondary | ICD-10-CM

## 2022-12-06 DIAGNOSIS — E890 Postprocedural hypothyroidism: Secondary | ICD-10-CM

## 2022-12-06 DIAGNOSIS — N185 Chronic kidney disease, stage 5: Secondary | ICD-10-CM

## 2022-12-06 DIAGNOSIS — D696 Thrombocytopenia, unspecified: Secondary | ICD-10-CM

## 2022-12-06 DIAGNOSIS — I480 Paroxysmal atrial fibrillation: Secondary | ICD-10-CM

## 2022-12-06 DIAGNOSIS — J9601 Acute respiratory failure with hypoxia: Secondary | ICD-10-CM | POA: Diagnosis not present

## 2022-12-06 DIAGNOSIS — D631 Anemia in chronic kidney disease: Secondary | ICD-10-CM

## 2022-12-06 DIAGNOSIS — K219 Gastro-esophageal reflux disease without esophagitis: Secondary | ICD-10-CM

## 2022-12-06 LAB — BASIC METABOLIC PANEL
Anion gap: 10 (ref 5–15)
BUN: 63 mg/dL — ABNORMAL HIGH (ref 8–23)
CO2: 23 mmol/L (ref 22–32)
Calcium: 8.4 mg/dL — ABNORMAL LOW (ref 8.9–10.3)
Chloride: 107 mmol/L (ref 98–111)
Creatinine, Ser: 6.75 mg/dL — ABNORMAL HIGH (ref 0.44–1.00)
GFR, Estimated: 6 mL/min — ABNORMAL LOW (ref >60)
Glucose, Bld: 172 mg/dL — ABNORMAL HIGH (ref 70–99)
Potassium: 3.6 mmol/L (ref 3.5–5.1)
Sodium: 140 mmol/L (ref 135–145)

## 2022-12-06 LAB — CULTURE, BLOOD (ROUTINE X 2)
Culture: NO GROWTH
Culture: NO GROWTH

## 2022-12-06 LAB — TYPE AND SCREEN: ABO/RH(D): O POS

## 2022-12-06 LAB — HEMOGLOBIN: Hemoglobin: 7.4 g/dL — ABNORMAL LOW (ref 12.0–15.0)

## 2022-12-06 LAB — KAPPA/LAMBDA LIGHT CHAINS
Kappa free light chain: 96 mg/L — ABNORMAL HIGH (ref 3.3–19.4)
Kappa, lambda light chain ratio: 1.74 — ABNORMAL HIGH (ref 0.26–1.65)
Lambda free light chains: 55.3 mg/L — ABNORMAL HIGH (ref 5.7–26.3)

## 2022-12-06 MED ORDER — CEFUROXIME AXETIL 500 MG PO TABS
250.0000 mg | ORAL_TABLET | Freq: Every day | ORAL | Status: AC
  Administered 2022-12-06 – 2022-12-08 (×3): 250 mg via ORAL
  Filled 2022-12-06 (×3): qty 0.5
  Filled 2022-12-06: qty 1

## 2022-12-06 MED ORDER — AZITHROMYCIN 250 MG PO TABS
250.0000 mg | ORAL_TABLET | Freq: Every day | ORAL | Status: AC
  Administered 2022-12-06 – 2022-12-07 (×2): 250 mg via ORAL
  Filled 2022-12-06 (×2): qty 1

## 2022-12-06 MED ORDER — CEFUROXIME AXETIL 250 MG PO TABS
250.0000 mg | ORAL_TABLET | Freq: Every day | ORAL | Status: DC
  Filled 2022-12-06: qty 1

## 2022-12-06 NOTE — Assessment & Plan Note (Signed)
Last hemoglobin A1c 6.1.

## 2022-12-06 NOTE — Assessment & Plan Note (Signed)
On Protonix 

## 2022-12-06 NOTE — Assessment & Plan Note (Deleted)
Acute kidney injury on CKD stage V.  Creatinine as low as 5.95.  Today's creatinine down to 5.18 after dialysis yesterday.  PermCath placed 6/7.   second dialysis session today.

## 2022-12-06 NOTE — Progress Notes (Signed)
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  Will check a pulse ox on room air with ambulation.  Patient still undecided on dialysis overnight.  Creatinine worsened to 6.75 with a GFR of 6.  Repeat hemoglobin still low at 7.4.  Assessment and Plan: * Acute hypoxemic respiratory failure (HCC) Likely secondary to multifocal pneumonia.  Check pulse ox on room air.  Multifocal pneumonia Antibiotics switched over to oral Zithromax and Ceftin.  Acute kidney injury superimposed on CKD (HCC) Acute kidney injury on CKD stage V.  Creatinine as low as 5.95.  Today's creatinine up at 6.75.  Paroxysmal atrial fibrillation (HCC) On Coreg and Eliquis  HTN (hypertension) On amlodipine and Coreg  Type 2 diabetes mellitus with other specified complication (HCC) Last hemoglobin A1c 6.1.  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  Patient's daughter stated her pulse ox was in the 70s when she came in.  Hemoglobin drifted down.  Patient okay with blood transfusion if needed.  Patient still thinking about dialysis.  Physical Exam: Vitals:   12/05/22 1940 12/06/22 0054  12/06/22 0735 12/06/22 1202  BP: 105/65 126/60 (!) 133/59 125/63  Pulse: 97 86 94 95  Resp: 16 (!) 21 20 (!) 22  Temp: 99.1 F (37.3 C) 98.6 F (37 C) 98.2 F (36.8 C) 98.6 F (37 C)  TempSrc: Oral Oral    SpO2: 94% 94% 92% 94%  Weight:      Height:       Physical Exam HENT:     Head: Normocephalic.     Mouth/Throat:     Pharynx: No oropharyngeal exudate.  Eyes:     General: Lids are normal.     Comments: Pale conjunctiva  Cardiovascular:     Rate and Rhythm: Normal rate and regular rhythm.     Heart sounds: Normal heart sounds, S1 normal and S2 normal.  Pulmonary:     Breath sounds: Examination of the right-lower field reveals decreased breath sounds. Examination of the left-lower field reveals decreased breath sounds. Decreased breath sounds present. No wheezing, rhonchi or rales.  Abdominal:     Palpations: Abdomen is soft.     Tenderness: There is no abdominal tenderness.  Musculoskeletal:     Right lower leg: Swelling present.     Left lower leg: Swelling present.  Skin:    General: Skin is warm.     Findings: No rash.  Neurological:     Mental Status: She is alert and oriented to person, place, and time.     Data Reviewed: Creatinine is 6.75 with a GFR of 6, ferritin 209, last hemoglobin 7.4  Family Communication: Spoke with patient's daughter at the bedside  Disposition: Status is: Inpatient Remains inpatient  appropriate because: Patient still undecided on dialysis or not.  Watching hemoglobin also.  Planned Discharge Destination: Home    Time spent: 28 minutes  Author: Alford Highland, MD 12/06/2022 1:37 PM  For on call review [email or web address].

## 2022-12-06 NOTE — Progress Notes (Signed)
[facility]  ROUNDING NOTE   Subjective:   Alexis Tucker was admitted to [facility] on 12/03/2022 for SOB (shortness of breath) [R06.02] Acute respiratory failure with hypoxia (HCC) [J96.01] Acute hypoxemic respiratory failure (HCC) [J96.01] Community acquired pneumonia of left upper lobe of lung [J18.9]  Patient seen resting in bed Alert and oriented States she feels good today.  Remains on 2L   Creatinine 6.75 (6.22)   Objective:  Vital signs in last 24 hours:  Temp:  [98.2 F (36.8 C)-99.5 F (37.5 C)] 98.6 F (37 C) (06/05 1202) Pulse Rate:  [86-100] 95 (06/05 1202) Resp:  [14-22] 22 (06/05 1202) BP: (105-133)/(53-65) 125/63 (06/05 1202) SpO2:  [91 %-99 %] 94 % (06/05 1202)  Weight change:  Filed Weights   12/03/22 1201  Weight: 75.3 kg    Intake/Output: I/O last 3 completed shifts: In: -  Out: 100 [Urine:100]   Intake/Output this shift:  Total I/O In: 480 [P.O.:480] Out: -   Physical Exam: General: NAD, laying in bed  Head: Normocephalic, atraumatic. Moist oral mucosal membranes  Eyes: Anicteric  Lungs:  Clear to auscultation, [place] O2  Heart: Regular rate and rhythm  Abdomen:  Soft, nontender,   Extremities:  No peripheral edema.  Neurologic: Nonfocal, moving all four extremities  Skin: No lesions  Access: AVG - no bruit or thrill    Basic Metabolic Panel: Recent Labs  Lab 12/03/22 1204 12/04/22 0506 12/05/22 0629 12/06/22 1135  NA 140 141 141 140  K 4.4 4.6 4.3 3.6  CL 106 106 107 107  CO2 23 23 21* 23  GLUCOSE 140* 146* 115* 172*  BUN 51* 52* 58* 63*  CREATININE 6.10* 5.95* 6.22* 6.75*  CALCIUM 8.9 9.0 8.9 8.4*  PHOS  --   --  4.8*  --      Liver Function Tests: Recent Labs  Lab 12/03/22 1545 12/05/22 0629  AST 16  --   ALT 10  --   ALKPHOS 51  --   BILITOT 1.2  --   PROT 7.1  --   ALBUMIN 3.8 3.2*    No results for input(s): "LIPASE", "AMYLASE" in the last 168 hours. No results for input(s): "AMMONIA"  in the last 168 hours.  CBC: Recent Labs  Lab 12/03/22 1204 12/04/22 0506 12/05/22 0629 12/06/22 1135  WBC 8.1 10.6* 8.5  --   NEUTROABS  --   --  7.0  --   HGB 9.2* 8.5* 7.3* 7.4*  HCT 29.1* 27.0* 23.1*  --   MCV 95.4 96.4 94.3  --   PLT 100* 101* 86*  --      Cardiac Enzymes: No results for input(s): "CKTOTAL", "CKMB", "CKMBINDEX", "TROPONINI" in the last 168 hours.  BNP: Invalid input(s): "POCBNP"  CBG: No results for input(s): "GLUCAP" in the last 168 hours.  Microbiology: Results for orders placed or performed during the hospital encounter of 12/03/22  Resp panel by RT-PCR (RSV, Flu A&B, Covid) Anterior Nasal Swab     Status: None   Collection Time: 12/03/22  3:45 PM   Specimen: Anterior Nasal Swab  Result Value Ref Range Status   SARS Coronavirus 2 by RT PCR NEGATIVE NEGATIVE Final    Comment: (NOTE) SARS-CoV-2 target nucleic acids are NOT DETECTED.  The SARS-CoV-2 RNA is generally detectable in upper respiratory specimens during the acute phase of infection. The lowest concentration of SARS-CoV-2 viral copies this assay can detect is 138 copies/mL. A negative result does not preclude SARS-Cov-2 infection and  should not be used as the sole basis for treatment or other patient management decisions. A negative result may occur with  improper specimen collection/handling, submission of specimen other than nasopharyngeal swab, presence of viral mutation(s) within the areas targeted by this assay, and inadequate number of viral copies(<138 copies/mL). A negative result must be combined with clinical observations, patient history, and epidemiological information. The expected result is Negative.  Fact Sheet for Patients:  BloggerCourse.com  Fact Sheet for Healthcare Providers:  SeriousBroker.it  This test is no t yet approved or cleared by the [place] FDA and  has been authorized for detection and/or  diagnosis of SARS-CoV-2 by FDA under an Emergency Use Authorization (EUA). This EUA will remain  in effect (meaning this test can be used) for the duration of the COVID-19 declaration under Section 564(b)(1) of the Act, 21 U.S.C.section 360bbb-3(b)(1), unless the authorization is terminated  or revoked sooner.       Influenza A by PCR NEGATIVE NEGATIVE Final   Influenza B by PCR NEGATIVE NEGATIVE Final    Comment: (NOTE) The Xpert Xpress SARS-CoV-2/FLU/RSV plus assay is intended as an aid in the diagnosis of influenza from Nasopharyngeal swab specimens and should not be used as a sole basis for treatment. Nasal washings and aspirates are unacceptable for Xpert Xpress SARS-CoV-2/FLU/RSV testing.  Fact Sheet for Patients: BloggerCourse.com  Fact Sheet for Healthcare Providers: SeriousBroker.it  This test is not yet approved or cleared by the [place] FDA and has been authorized for detection and/or diagnosis of SARS-CoV-2 by FDA under an Emergency Use Authorization (EUA). This EUA will remain in effect (meaning this test can be used) for the duration of the COVID-19 declaration under Section 564(b)(1) of the Act, 21 U.S.C. section 360bbb-3(b)(1), unless the authorization is terminated or revoked.     Resp Syncytial Virus by PCR NEGATIVE NEGATIVE Final    Comment: (NOTE) Fact Sheet for Patients: BloggerCourse.com  Fact Sheet for Healthcare Providers: SeriousBroker.it  This test is not yet approved or cleared by the [place] FDA and has been authorized for detection and/or diagnosis of SARS-CoV-2 by FDA under an Emergency Use Authorization (EUA). This EUA will remain in effect (meaning this test can be used) for the duration of the COVID-19 declaration under Section 564(b)(1) of the Act, 21 U.S.C. section 360bbb-3(b)(1), unless the authorization is terminated  or revoked.  Performed at [facility], [address] [place], [place] [address]   Blood Culture (routine x 2)     Status: None (Preliminary result)   Collection Time: 12/03/22  3:45 PM   Specimen: BLOOD  Result Value Ref Range Status   Specimen Description BLOOD BLOOD LEFT ARM  Final   Special Requests   Final    BOTTLES DRAWN AEROBIC AND ANAEROBIC Blood Culture adequate volume   Culture   Final    NO GROWTH 3 DAYS Performed at [facility], [address], [place], [place] [address]    Report Status PENDING  Incomplete  Blood Culture (routine x 2)     Status: None (Preliminary result)   Collection Time: 12/03/22  4:30 PM   Specimen: BLOOD  Result Value Ref Range Status   Specimen Description BLOOD BLOOD LEFT ARM  Final   Special Requests   Final    BOTTLES DRAWN AEROBIC AND ANAEROBIC Blood Culture adequate volume   Culture   Final    NO GROWTH 3 DAYS Performed at [facility], [address] [place], [place] [address]  Report Status PENDING  Incomplete    Coagulation Studies: Recent Labs    12/03/22 1545  LABPROT 28.2*  INR 2.6*     Urinalysis: Recent Labs    12/03/22 1545 12/05/22 1344  COLORURINE YELLOW* YELLOW*  LABSPEC 1.012 1.011  PHURINE 7.0 5.0  GLUCOSEU 50* 50*  HGBUR SMALL* SMALL*  BILIRUBINUR NEGATIVE NEGATIVE  KETONESUR NEGATIVE NEGATIVE  PROTEINUR >=300* 100*  NITRITE NEGATIVE NEGATIVE  LEUKOCYTESUR NEGATIVE NEGATIVE       Imaging: No results found.   Medications:      allopurinol  100 mg Oral Daily   amLODipine  10 mg Oral Daily   anastrozole  1 mg Oral Daily   apixaban  2.5 mg Oral BID   azithromycin  250 mg Oral Daily   carvedilol  12.5 mg Oral BID WC   cefUROXime  250 mg Oral Q supper   furosemide  40 mg Intravenous BID   isosorbide mononitrate  30 mg Oral Daily   levothyroxine  88 mcg Oral QAC breakfast   pantoprazole  40 mg Oral QHS   sodium bicarbonate  1,300 mg Oral BID    acetaminophen **OR** acetaminophen, hydrALAZINE, ondansetron **OR** ondansetron (ZOFRAN) IV, senna-docusate  Assessment/ Plan:  Ms. Jamiesha Marchiano is a 82 y.o.  female with breast cancer,  hypertension, hypothyroidism, GERD, atrial fibrillation, who was originally admitted to [facility] on 12/03/2022 for SOB (shortness of breath) [R06.02] Acute respiratory failure with hypoxia (HCC) [J96.01] Acute hypoxemic respiratory failure (HCC) [J96.01] Community acquired pneumonia of left upper lobe of lung [J18.9]  Chronic kidney disease stage V: no acute indication for dialysis.  -Continue IV Furosemide 40mg  BID - IVF - Renal function continues to decline, electrolytes stable.  - Remains agreeable to dialysis, if needed.  - AVG nonfunctioning, will need tunneled catheter if dialysis is required.   Hypertension with chronic kidney disease: continue carvedilol, isosorbide mononitrate and amlodipine.   Anemia of chronic kidney disease: hemoglobin 7.4. Not currently getting ESA due to active malignancy.   Acute exacerbation of chronic congestive heart failure - Continue IV furosemide   LOS: 3   6/5/20241:55 PM

## 2022-12-06 NOTE — Progress Notes (Signed)
SATURATION QUALIFICATIONS:   Patient Saturations on Room Air at Rest = 84%  Patient Saturations on 3 Liters of oxygen while Ambulating = 91%

## 2022-12-06 NOTE — Assessment & Plan Note (Addendum)
Last platelet count 136

## 2022-12-06 NOTE — Assessment & Plan Note (Deleted)
Responded to blood transfusion with hemoglobin up to 8.6.

## 2022-12-06 NOTE — Assessment & Plan Note (Signed)
On Synthroid

## 2022-12-06 NOTE — Assessment & Plan Note (Addendum)
Completed antibiotics 

## 2022-12-07 ENCOUNTER — Inpatient Hospital Stay: Payer: Medicare Other

## 2022-12-07 DIAGNOSIS — Z87891 Personal history of nicotine dependence: Secondary | ICD-10-CM | POA: Diagnosis not present

## 2022-12-07 DIAGNOSIS — D631 Anemia in chronic kidney disease: Secondary | ICD-10-CM | POA: Diagnosis not present

## 2022-12-07 DIAGNOSIS — R1084 Generalized abdominal pain: Secondary | ICD-10-CM

## 2022-12-07 DIAGNOSIS — Z7901 Long term (current) use of anticoagulants: Secondary | ICD-10-CM

## 2022-12-07 DIAGNOSIS — Z992 Dependence on renal dialysis: Secondary | ICD-10-CM | POA: Diagnosis not present

## 2022-12-07 DIAGNOSIS — J9601 Acute respiratory failure with hypoxia: Secondary | ICD-10-CM | POA: Diagnosis not present

## 2022-12-07 DIAGNOSIS — J189 Pneumonia, unspecified organism: Secondary | ICD-10-CM | POA: Diagnosis not present

## 2022-12-07 DIAGNOSIS — N186 End stage renal disease: Secondary | ICD-10-CM | POA: Diagnosis not present

## 2022-12-07 DIAGNOSIS — N179 Acute kidney failure, unspecified: Secondary | ICD-10-CM | POA: Diagnosis not present

## 2022-12-07 DIAGNOSIS — R109 Unspecified abdominal pain: Secondary | ICD-10-CM

## 2022-12-07 LAB — TYPE AND SCREEN

## 2022-12-07 LAB — PREPARE RBC (CROSSMATCH)

## 2022-12-07 LAB — BPAM RBC: Unit Type and Rh: 5100

## 2022-12-07 LAB — BASIC METABOLIC PANEL
Anion gap: 15 (ref 5–15)
BUN: 64 mg/dL — ABNORMAL HIGH (ref 8–23)
CO2: 18 mmol/L — ABNORMAL LOW (ref 22–32)
Calcium: 8.8 mg/dL — ABNORMAL LOW (ref 8.9–10.3)
Chloride: 107 mmol/L (ref 98–111)
Creatinine, Ser: 7.13 mg/dL — ABNORMAL HIGH (ref 0.44–1.00)
GFR, Estimated: 5 mL/min — ABNORMAL LOW (ref >60)
Glucose, Bld: 185 mg/dL — ABNORMAL HIGH (ref 70–99)
Potassium: 3.9 mmol/L (ref 3.5–5.1)
Sodium: 140 mmol/L (ref 135–145)

## 2022-12-07 LAB — CULTURE, BLOOD (ROUTINE X 2)

## 2022-12-07 LAB — HEMOGLOBIN: Hemoglobin: 7.3 g/dL — ABNORMAL LOW (ref 12.0–15.0)

## 2022-12-07 MED ORDER — HEPARIN SODIUM (PORCINE) 1000 UNIT/ML DIALYSIS
1000.0000 [IU] | INTRAMUSCULAR | Status: DC | PRN
  Administered 2022-12-11 – 2022-12-15 (×2): 1000 [IU]
  Filled 2022-12-07 (×2): qty 1

## 2022-12-07 MED ORDER — SODIUM CHLORIDE 0.9 % IV SOLN: INTRAVENOUS | Status: DC

## 2022-12-07 MED ORDER — PROCHLORPERAZINE EDISYLATE 10 MG/2ML IJ SOLN
5.0000 mg | Freq: Once | INTRAMUSCULAR | Status: AC
  Administered 2022-12-07: 5 mg via INTRAVENOUS
  Filled 2022-12-07: qty 1

## 2022-12-07 MED ORDER — CHLORHEXIDINE GLUCONATE CLOTH 2 % EX PADS
6.0000 | MEDICATED_PAD | Freq: Every day | CUTANEOUS | Status: DC
  Administered 2022-12-08 – 2022-12-15 (×8): 6 via TOPICAL

## 2022-12-07 MED ORDER — POLYETHYLENE GLYCOL 3350 17 G PO PACK
17.0000 g | PACK | Freq: Every day | ORAL | Status: DC
  Administered 2022-12-07 – 2022-12-12 (×4): 17 g via ORAL
  Filled 2022-12-07 (×6): qty 1

## 2022-12-07 MED ORDER — CEFAZOLIN SODIUM-DEXTROSE 1-4 GM/50ML-% IV SOLN
1.0000 g | INTRAVENOUS | Status: AC
  Administered 2022-12-08: 1 g via INTRAVENOUS
  Filled 2022-12-07: qty 50

## 2022-12-07 MED ORDER — ANTICOAGULANT SODIUM CITRATE 4% (200MG/5ML) IV SOLN: 5.0000 mL | Status: DC | PRN

## 2022-12-07 MED ORDER — LIDOCAINE-PRILOCAINE 2.5-2.5 % EX CREA: 1.0000 | TOPICAL_CREAM | CUTANEOUS | Status: DC | PRN

## 2022-12-07 MED ORDER — ALTEPLASE 2 MG IJ SOLR: 2.0000 mg | Freq: Once | INTRAMUSCULAR | Status: DC | PRN

## 2022-12-07 MED ORDER — ACETAMINOPHEN 325 MG PO TABS
650.0000 mg | ORAL_TABLET | Freq: Once | ORAL | Status: AC
  Administered 2022-12-07: 650 mg via ORAL
  Filled 2022-12-07: qty 2

## 2022-12-07 MED ORDER — OXYCODONE HCL 5 MG PO TABS
5.0000 mg | ORAL_TABLET | ORAL | Status: DC | PRN
  Administered 2022-12-07: 5 mg via ORAL
  Filled 2022-12-07: qty 1

## 2022-12-07 MED ORDER — MORPHINE SULFATE (PF) 2 MG/ML IV SOLN: 1.0000 mg | INTRAVENOUS | Status: DC | PRN

## 2022-12-07 MED ORDER — SODIUM CHLORIDE 0.9% IV SOLUTION: Freq: Once | INTRAVENOUS | Status: AC

## 2022-12-07 MED ORDER — PENTAFLUOROPROP-TETRAFLUOROETH EX AERO: 1.0000 | INHALATION_SPRAY | CUTANEOUS | Status: DC | PRN

## 2022-12-07 MED ORDER — LIDOCAINE HCL (PF) 1 % IJ SOLN: 5.0000 mL | INTRAMUSCULAR | Status: DC | PRN

## 2022-12-07 NOTE — Consult Note (Signed)
Hospital Consult    Reason for Consult:  ESRD Needed dialysis Perma Catheter placement  Requesting Physician:  Malachi Carl NP MRN #:  [id number]  History of Present Illness: This is a 82 y.o. 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 After further workup it was determined the patient would need to start hemodialysis. The patient had previously been undecided in doing so but has now agreed to start hemodialysis. Vascular surgery was consulted for catheter placement.   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   a.) Avastin injections as  [facility]   DJD (degenerative joint disease)    ESRD (end stage renal disease) (HCC)    GERD (gastroesophageal reflux disease)    Glaucoma 03/2014   History of colon cancer 2006   a.) s/p colectomy   History of hepatitis A 1990s   from food, resolved   HTN (hypertension)    Hyperplastic colon polyp    Long term current use of anticoagulant    a.) apixaban   Long term current use of antithrombotics/antiplatelets    a.) pentoxifylline   Macular degeneration    wet R with vision loss, dry L; to see retina specialist   Neuropathy    OA (osteoarthritis)    Osteonecrosis (HCC)    hips?   PONV (postoperative nausea and vomiting)    Post-surgical hypothyroidism    Thrombocytopenia (HCC)    Tubular adenoma    Wears dentures    partial upper    Past Surgical History:  Procedure Laterality Date   A/V FISTULAGRAM Right 06/08/2022   Procedure: A/V Fistulagram;  Surgeon: Annice Needy, MD;  Location: [facility];  Service: Cardiovascular;  Laterality: Right;   ABDOMINAL HYSTERECTOMY  1976   partial, after tubal pregnancy, h/o fibroids with heavy bleeding   AV FISTULA PLACEMENT Right 02/01/2022   Procedure: INSERTION OF ARTERIOVENOUS (AV) GORE-TEX GRAFT ARM (LEFT UPPER EXTREMITY);  Surgeon: Annice Needy, MD;  Location: [facility] ORS;  Service:  Vascular;  Laterality: Right;   BREAST BIOPSY Left 2008   positive   BREAST BIOPSY Left 11/22/2017   2oclock 2cmfn wing shaped clip, INVASIVE MAMMARY CARCINOMA   BREAST BIOPSY Left 11/22/2017   2oclock 4cmfn coil shaped clip, INVASIVE MAMMARY CARCINOMA   BREAST BIOPSY Left 11/22/2017   lymph node - butterfly hydroMARK   BREAST EXCISIONAL BIOPSY Left 01/07/2018   lumpectomy by Dr. Lemar Livings   BREAST LUMPECTOMY Left 2008   F/U radiation   BREAST LUMPECTOMY  Left 2019   2 areas of [facility] at 2:00 position f/u with mammosite   BREAST LUMPECTOMY WITH SENTINEL LYMPH NODE BIOPSY Left 01/07/2018   Procedure: BREAST LUMPECTOMY WITH SENTINEL LYMPH NODE BX;  Surgeon: Earline Mayotte, MD;  Location: [facility] ORS;  Service: General;  Laterality: Left;   BREAST MAMMOSITE  2008   carotid ultrasound  12/2012   1-39% bilateral stenosis, severe in ECA   CATARACT EXTRACTION W/PHACO Left 08/14/2016   Procedure: CATARACT EXTRACTION PHACO AND INTRAOCULAR LENS PLACEMENT (IOC)  Left Diabetic;  Surgeon: Sherald Hess, MD;  Location: [facility];  Service: Ophthalmology;  Laterality: Left;  Diabetic - oral meds   CHOLECYSTECTOMY  2005   COLECTOMY  2005   colon cancer   COLON SURGERY  2006   bowel obstruction   COLONOSCOPY  2008   ext hem, ileo-colonic anastomosis in cecum, tubular adenoma x1, rec rpt 1 yr for multiple polyps (in [place])   COLONOSCOPY WITH PROPOFOL N/A 10/19/2021   2.5cm TVA with low grade dysplasia, diverticulosis, no rpt recommended [name], [name], MD)   ESOPHAGOGASTRODUODENOSCOPY N/A 05/06/2016   Procedure: ESOPHAGOGASTRODUODENOSCOPY (EGD);  Surgeon: Scot Jun, MD;  Location: [facility];  Service: Endoscopy;  Laterality: N/A;   EYE SURGERY     JOINT REPLACEMENT     THYROIDECTOMY, PARTIAL     unclear why   TOTAL KNEE ARTHROPLASTY Right 1999   UPPER EXTREMITY ANGIOGRAPHY Right 05/18/2022   Procedure: Upper Extremity Angiography;  Surgeon: Annice Needy, MD;  Location: [facility] INV[facility] CV [facility];  Service: Cardiovascular;  Laterality: Right;   US ECHOCARDIOGRAPHY  11/2007   nl LV fxn EF 60%, diastolic dysfunction, LVH, tr TR and MR, slcerotic aortic valve   VEIN LIGATION AND STRIPPING      Allergies  Allergen Reactions   Tramadol Other (See Comments)    nightmares   Codeine Anxiety   Sulfa Antibiotics Itching and Rash    Prior to Admission medications   Medication Sig Start Date End Date Taking? Authorizing Provider   allopurinol (ZYLOPRIM) 100 MG tablet Take 1 tablet (100 mg total) by mouth daily. For gout 10/03/22  Yes Eustaquio Boyden, MD  amLODipine (NORVASC) 10 MG tablet Take 10 mg by mouth daily.   Yes Mosetta Pigeon, MD  anastrozole (ARIMIDEX) 1 MG tablet TAKE 1 TABLET BY MOUTH EVERY DAY 11/13/22  Yes Earna Coder, MD  apixaban (ELIQUIS) 5 MG TABS tablet Take 1 tablet (5 mg total) by mouth 2 (two) times daily. As blood thinner 10/03/22  Yes Eustaquio Boyden, MD  Blood Glucose Monitoring Suppl [facility] BLOOD GLUCOSE METER) W/DEVICE KIT Use as directed to check sugars daily 250.42 03/13/14  Yes Eustaquio Boyden, MD  carvedilol (COREG) 25 MG tablet Take 0.5 tablets (12.5 mg total) by mouth 2 (two) times daily with a meal. 11/01/22  Yes Agbor-Etang, Arlys John, MD  Cholecalciferol (VITAMIN D-3) 5000 UNITS TABS Take 1 tablet by mouth daily.   Yes [provider]  cloNIDine HCl (KAPVAY) 0.1  MG TB12 ER tablet Take 0.1 mg by mouth 2 (two) times daily. PRN   Yes [provider]  isosorbide mononitrate (IMDUR) 30 MG 24 hr tablet Take 1 tablet (30 mg total) by mouth daily. 11/01/22 01/30/23 Yes Agbor-Etang, Arlys John, MD  levothyroxine (SYNTHROID) 88 MCG tablet Take 1 tablet (88 mcg total) by mouth daily. For hypothyroidism 10/03/22  Yes Eustaquio Boyden, MD  linagliptin (TRADJENTA) 5 MG TABS tablet Take 1 tablet (5 mg total) by mouth daily. 07/10/22  Yes Eustaquio Boyden, MD  pantoprazole (PROTONIX) 40 MG tablet Take 1 tablet (40 mg total) by mouth daily as needed. For reflux 10/03/22  Yes Eustaquio Boyden, MD  sodium bicarbonate 650 MG tablet Take 1,300 mg by mouth 2 (two) times daily.   Yes [provider]    Social History   Socioeconomic History   Marital status: Divorced    Spouse name: Not on file   Number of children: 1   Years of education: Not on file   Highest education level: Not on file  Occupational History   Not on file  Tobacco Use   Smoking status: Former    Passive  exposure: Past   Smokeless tobacco: Never   Tobacco comments:    quit over 40 yrs ago  Vaping Use   Vaping Use: Never used  Substance and Sexual Activity   Alcohol use: Never    Comment: rare   Drug use: Never   Sexual activity: Not Currently  Other Topics Concern   Not on file  Social History Narrative   Lives alone,   Granddaughter in [place] (attorney).   Divorced   Occu: retired, worked in Consulting civil engineer   Edu: college   Has 1 living daughter. (One passed)   Social Determinants of Health   Financial Resource Strain: Low Risk  (12/28/2021)   Overall Financial Resource Strain (CARDIA)    Difficulty of Paying Living Expenses: Not hard at all  Food Insecurity: No Food Insecurity (12/05/2022)   Hunger Vital Sign    Worried About Running Out of Food in the Last Year: Never true    Ran Out of Food in the Last Year: Never true  Transportation Needs: No Transportation Needs (12/05/2022)   PRAPARE - Administrator, Civil Service (Medical): No    Lack of Transportation (Non-Medical): No  Physical Activity: Inactive (12/28/2021)   Exercise Vital Sign    Days of Exercise per Week: 0 days    Minutes of Exercise per Session: 0 min  Stress: No Stress Concern Present (12/28/2021)   [employer] of Occupational Health - Occupational Stress Questionnaire    Feeling of Stress : Not at all  Social Connections: Not on file  Intimate Partner Violence: Not At Risk (12/05/2022)   Humiliation, Afraid, Rape, and Kick questionnaire    Fear of Current or Ex-Partner: No    Emotionally Abused: No    Physically Abused: No    Sexually Abused: No     Family History  Problem Relation Age of Onset   Diabetes Mother    Hypertension Mother    Arthritis Mother    Alcohol abuse Father    Arthritis Father    Stroke Daughter    CAD Neg Hx    Cancer Neg Hx    Breast cancer Neg Hx     ROS: Otherwise negative unless mentioned in HPI  Physical Examination  Vitals:   12/07/22 1744 12/07/22 2003  BP:  133/65 138/70  Pulse: 85 82  Resp: 16 18  Temp: 97.9 F (36.6 C) 98.4 F (36.9 C)  SpO2: 97% 98%   Body mass index is 29.41 kg/m.  General:  WDWN in NAD Gait: Not observed HENT: WNL, normocephalic Pulmonary: normal non-labored breathing, without Rales, rhonchi,  wheezing Cardiac: irregular, Hx Atrial Fibrillation, without  Murmurs, rubs or gallops; without carotid bruits Abdomen: Positive Bowel Sounds,  soft, NT/ND, no masses Skin: without rashes Vascular Exam/Pulses: Positive Palpable Pulses throughout  Extremities: without ischemic changes, without Gangrene , without cellulitis; without open wounds;  Musculoskeletal: no muscle wasting or atrophy  Neurologic: A&O X 3;  No focal weakness or paresthesias are detected; speech is fluent/normal Psychiatric:  The pt has Normal affect. Lymph:  Unremarkable  CBC    Component Value Date/Time   WBC 8.5 12/05/2022 0629   RBC 2.45 (L) 12/05/2022 0629   HGB 7.3 (L) 12/07/2022 0947   HGB 10.6 02/26/2014 0000   HCT 23.1 (L) 12/05/2022 0629   HCT 35.8 10/16/2012 1227   PLT 86 (L) 12/05/2022 0629   PLT 132 (L) 10/16/2012 1227   MCV 94.3 12/05/2022 0629   MCV 91 10/16/2012 1227   MCV 89.0 01/11/2012 0000   MCH 29.8 12/05/2022 0629   MCHC 31.6 12/05/2022 0629   RDW 14.0 12/05/2022 0629   RDW 14.0 10/16/2012 1227   LYMPHSABS 0.7 12/05/2022 0629   MONOABS 0.7 12/05/2022 0629   EOSABS 0.1 12/05/2022 0629   BASOSABS 0.0 12/05/2022 0629    BMET    Component Value Date/Time   NA 140 12/07/2022 0947   NA 140 10/16/2012 1227   K 3.9 12/07/2022 0947   K 4.3 02/26/2014 0000   CL 107 12/07/2022 0947   CL 106 10/16/2012 1227   CO2 18 (L) 12/07/2022 0947   CO2 24 10/16/2012 1227   GLUCOSE 185 (H) 12/07/2022 0947   GLUCOSE 262 (H) 10/16/2012 1227   BUN 64 (H) 12/07/2022 0947   BUN 22 (H) 10/16/2012 1227   CREATININE 7.13 (H) 12/07/2022 0947   CREATININE 4.05 02/26/2014 0000   CALCIUM 8.8 (L) 12/07/2022 0947   CALCIUM 9.0 10/16/2012  1227   GFRNONAA 5 (L) 12/07/2022 0947   GFRAA 10 (L) 01/07/2018 0859    COAGS: Lab Results  Component Value Date   INR 2.6 (H) 12/03/2022   INR 1.03 12/28/2017     Non-Invasive Vascular Imaging:   None Ordered   Statin:  No. Beta Blocker:  Yes.   Aspirin:  No. ACEI:  No. ARB:  No. CCB use:  Yes Other antiplatelets/anticoagulants:  Yes.   Eliquis 5 mg twice daily   ASSESSMENT/PLAN: This is a 82 y.o. female now with end stage renal disease requiring hemodialysis. Vascular surgery was consulted to place a Dialysis Perma Catheter.   PLAN:  Vascular surgery plans on taking the patient to the vascular lab for dialysis Perma Catheter placement for hemodialysis. I discussed in detail with the patient and her daughter at the bedside the procedure, benefits, risks and complications. Both verbalized their understanding. I answered all their questions. They wish to proceed with the procedure as soon as possible. Patient will be made NPO after midnight.    -I discussed in detail the plan with Dr Vilinda Flake MD and he agrees with the plan.    Marcie Bal [facility] 12/07/2022 11:11 PM

## 2022-12-07 NOTE — Assessment & Plan Note (Addendum)
Resolved.  Continue MiraLAX just in case constipation related.

## 2022-12-07 NOTE — Progress Notes (Signed)
PT Cancellation Note  Patient Details Name: Alexis Tucker MRN: [id number] DOB: [birth year]   Cancelled Treatment:    Reason Eval/Treat Not Completed: Other (comment) (Chart reviewed, treatment attempted. MD and DTR at bedside communicate recent acute ABD pain issue, in process of being evaluated. They recommend PT be deferred at this time time. Will continue to follow.)  12:49 PM, 12/07/22 Rosamaria Lints, PT, DPT Physical Therapist - [facility]  [phone] (ASCOM)    [name] C 12/07/2022, 12:49 PM

## 2022-12-07 NOTE — TOC Initial Note (Signed)
Transition of Care [facility]) - Initial/Assessment Note    Patient Details  Name: Alexis Tucker MRN: [id number] Date of Birth: 1940-07-13  Transition of Care [facility]) CM/SW Contact:    Truddie Hidden, RN Phone Number: 12/07/2022, 12:47 PM  Clinical Narrative:                 Spoke with patient and her daughter at the bedside.  Patient daughter, [name] lives with her. [name] takes her to appointment and gets her medications from [facility]. Patient has cane.  [name] will transport patient home at discharge. Patient is agreeable to [facility] PT and does not have a preference of HH agency.  She was advised referral would be sent to [facility] and they would contact her directly to scheduled the [facility].  Referral sent and accepted by Barbara Cower from [facility].           Patient Goals and CMS Choice            Expected Discharge Plan and Services                                              Prior Living Arrangements/Services                       Activities of Daily Living Home Assistive Devices/Equipment: Cane (specify quad or straight) ADL Screening (condition at time of admission) Patient's cognitive ability adequate to safely complete daily activities?: No Is the patient deaf or have difficulty hearing?: No Does the patient have difficulty seeing, even when wearing glasses/contacts?: Yes Does the patient have difficulty concentrating, remembering, or making decisions?: No Patient able to express need for assistance with ADLs?: No Does the patient have difficulty dressing or bathing?: Yes Independently performs ADLs?: No Communication: Independent Dressing (OT): Needs assistance Is this a change from baseline?: Pre-admission baseline Grooming: Needs assistance Is this a change from baseline?: Pre-admission baseline Feeding: Independent Bathing: Needs assistance Is this a change from baseline?: Pre-admission baseline Toileting: Needs assistance Is this a  change from baseline?: Pre-admission baseline In/Out Bed: Needs assistance Is this a change from baseline?: Pre-admission baseline Walks in Home: Needs assistance Is this a change from baseline?: Pre-admission baseline Does the patient have difficulty walking or climbing stairs?: Yes Weakness of Legs: Both Weakness of Arms/Hands: None  Permission Sought/Granted                  Emotional Assessment              Admission diagnosis:  SOB (shortness of breath) [R06.02] Acute respiratory failure with hypoxia (HCC) [J96.01] Acute hypoxemic respiratory failure (HCC) [J96.01] Community acquired pneumonia of left upper lobe of lung [J18.9] Patient Active Problem List   Diagnosis Date Noted   Multifocal pneumonia 12/06/2022   Acute kidney injury superimposed on CKD (HCC) 12/06/2022   DNR (do not resuscitate) 12/03/2022   Glenohumeral arthritis, left 10/14/2022   Rotator cuff disorder, left 10/14/2022   Left arm weakness 10/13/2022   Atypical chest pain 10/13/2022   Memory impairment 10/09/2022   Bilateral hearing loss due to cerumen impaction 09/25/2022   Pruritus 09/25/2022   Decreased hearing of both ears 09/25/2022   Acute hypoxemic respiratory failure (HCC) 04/22/2022   Iron deficiency 02/20/2022   Arteriovenous graft for hemodialysis in place, primary 02/09/2022   General weakness 11/26/2021  Unintentional weight loss 09/19/2021   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   Primary open angle glaucoma (POAG) of both eyes 06/19/2020   Malignant neoplasm of upper-outer quadrant of left breast in female, estrogen receptor positive (HCC) 12/03/2017   Osteoporosis 09/29/2017   History of arterial ischemic stroke 08/27/2017   Thrombocytopenia (HCC) 08/27/2017   Anemia due to stage 5 chronic kidney disease, 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) 08/27/2017   Paroxysmal atrial fibrillation (HCC)    Elevated troponin 05/04/2016   Assistance needed with  transportation 07/31/2015   Advanced care planning/counseling discussion 07/27/2015   Lower back pain 03/14/2014   Macular degeneration    Diabetic retinopathy with macular edema (HCC) 03/03/2014   Medicare annual wellness visit, subsequent 01/07/2013   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) 01/06/2013   Carotid stenosis, asymptomatic 12/05/2012   Post-surgical hypothyroidism    OA (osteoarthritis)    Type 2 diabetes mellitus with other specified complication (HCC)    HTN (hypertension)    History of breast cancer    History of colon cancer    GERD (gastroesophageal reflux disease)    PCP:  Eustaquio Boyden, MD Pharmacy:   [phone] [place] [place] - [address] [place] [address] Phone: [phone] Fax: [phone]     Social Determinants of Health (SDOH) Social History: SDOH Screenings   Food Insecurity: No Food Insecurity (12/05/2022)  Housing: Low Risk  (12/05/2022)  Transportation Needs: No Transportation Needs (12/05/2022)  Utilities: Not At Risk (12/05/2022)  Depression (PHQ2-9): Low Risk  (09/25/2022)  Financial Resource Strain: Low Risk  (12/28/2021)  Physical Activity: Inactive (12/28/2021)  Stress: No Stress Concern Present (12/28/2021)  Tobacco Use: Medium Risk (12/03/2022)   SDOH Interventions:     Readmission Risk Interventions     No data to display

## 2022-12-07 NOTE — H&P (View-Only) (Signed)
Hospital Consult    Reason for Consult:  ESRD Needed dialysis Perma Catheter placement  Requesting Physician:  Shantell Breeze NP MRN #:  [id number]  History of Present Illness: This is a 81 y.o. 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 After further workup it was determined the patient would need to start hemodialysis. The patient had previously been undecided in doing so but has now agreed to start hemodialysis. Vascular surgery was consulted for catheter placement.   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   a.) Avastin injections as  [facility]   DJD (degenerative joint disease)    ESRD (end stage renal disease) (HCC)    GERD (gastroesophageal reflux disease)    Glaucoma 03/2014   History of colon cancer 2006   a.) s/p colectomy   History of hepatitis A 1990s   from food, resolved   HTN (hypertension)    Hyperplastic colon polyp    Long term current use of anticoagulant    a.) apixaban   Long term current use of antithrombotics/antiplatelets    a.) pentoxifylline   Macular degeneration    wet R with vision loss, dry L; to see retina specialist   Neuropathy    OA (osteoarthritis)    Osteonecrosis (HCC)    hips?   PONV (postoperative nausea and vomiting)    Post-surgical hypothyroidism    Thrombocytopenia (HCC)    Tubular adenoma    Wears dentures    partial upper    Past Surgical History:  Procedure Laterality Date   A/V FISTULAGRAM Right 06/08/2022   Procedure: A/V Fistulagram;  Surgeon: Dew, Jason S, MD;  Location: [facility];  Service: Cardiovascular;  Laterality: Right;   ABDOMINAL HYSTERECTOMY  1976   partial, after tubal pregnancy, h/o fibroids with heavy bleeding   AV FISTULA PLACEMENT Right 02/01/2022   Procedure: INSERTION OF ARTERIOVENOUS (AV) GORE-TEX GRAFT ARM (LEFT UPPER EXTREMITY);  Surgeon: Dew, Jason S, MD;  Location: [facility];  Service:   Vascular;  Laterality: Right;   BREAST BIOPSY Left 2008   positive   BREAST BIOPSY Left 11/22/2017   2oclock 2cmfn wing shaped clip, INVASIVE MAMMARY CARCINOMA   BREAST BIOPSY Left 11/22/2017   2oclock 4cmfn coil shaped clip, INVASIVE MAMMARY CARCINOMA   BREAST BIOPSY Left 11/22/2017   lymph node - butterfly hydroMARK   BREAST EXCISIONAL BIOPSY Left 01/07/2018   lumpectomy by Dr. Byrnett   BREAST LUMPECTOMY Left 2008   F/U radiation   BREAST LUMPECTOMY  Left 2019   2 areas of IMC at 2:00 position f/u with mammosite   BREAST LUMPECTOMY WITH SENTINEL LYMPH NODE BIOPSY Left 01/07/2018   Procedure: BREAST LUMPECTOMY WITH SENTINEL LYMPH NODE BX;  Surgeon: Byrnett, Jeffrey W, MD;  Location: [facility];  Service: General;  Laterality: Left;   BREAST MAMMOSITE  2008   carotid ultrasound  12/2012   1-39% bilateral stenosis, severe in ECA   CATARACT EXTRACTION W/PHACO Left 08/14/2016   Procedure: CATARACT EXTRACTION PHACO AND INTRAOCULAR LENS PLACEMENT (IOC)  Left Diabetic;  Surgeon: Anita Prakash Vin-Parikh, MD;  Location: [facility];  Service: Ophthalmology;  Laterality: Left;  Diabetic - oral meds   CHOLECYSTECTOMY  2005   COLECTOMY  2005   colon cancer   COLON SURGERY  2006   bowel obstruction   COLONOSCOPY  2008   ext hem, ileo-colonic anastomosis in cecum, tubular adenoma x1, rec rpt 1 yr for multiple polyps (in [place])   COLONOSCOPY WITH PROPOFOL N/A 10/19/2021   2.5cm TVA with low grade dysplasia, diverticulosis, no rpt recommended (Anna, Kiran, MD)   ESOPHAGOGASTRODUODENOSCOPY N/A 05/06/2016   Procedure: ESOPHAGOGASTRODUODENOSCOPY (EGD);  Surgeon: Robert T Elliott, MD;  Location: [facility];  Service: Endoscopy;  Laterality: N/A;   EYE SURGERY     JOINT REPLACEMENT     THYROIDECTOMY, PARTIAL     unclear why   TOTAL KNEE ARTHROPLASTY Right 1999   UPPER EXTREMITY ANGIOGRAPHY Right 05/18/2022   Procedure: Upper Extremity Angiography;  Surgeon: Dew, Jason S, MD;  Location: [facility];  Service: Cardiovascular;  Laterality: Right;   US ECHOCARDIOGRAPHY  11/2007   nl LV fxn EF 60%, diastolic dysfunction, LVH, tr TR and MR, slcerotic aortic valve   VEIN LIGATION AND STRIPPING      Allergies  Allergen Reactions   Tramadol Other (See Comments)    nightmares   Codeine Anxiety   Sulfa Antibiotics Itching and Rash    Prior to Admission medications   Medication Sig Start Date End Date Taking? Authorizing Provider   allopurinol (ZYLOPRIM) 100 MG tablet Take 1 tablet (100 mg total) by mouth daily. For gout 10/03/22  Yes Gutierrez, Javier, MD  amLODipine (NORVASC) 10 MG tablet Take 10 mg by mouth daily.   Yes Singh, Harmeet, MD  anastrozole (ARIMIDEX) 1 MG tablet TAKE 1 TABLET BY MOUTH EVERY DAY 11/13/22  Yes Brahmanday, Govinda R, MD  apixaban (ELIQUIS) 5 MG TABS tablet Take 1 tablet (5 mg total) by mouth 2 (two) times daily. As blood thinner 10/03/22  Yes Gutierrez, Javier, MD  Blood Glucose Monitoring Suppl (ACURA BLOOD GLUCOSE METER) W/DEVICE KIT Use as directed to check sugars daily 250.42 03/13/14  Yes Gutierrez, Javier, MD  carvedilol (COREG) 25 MG tablet Take 0.5 tablets (12.5 mg total) by mouth 2 (two) times daily with a meal. 11/01/22  Yes Agbor-Etang, Brian, MD  Cholecalciferol (VITAMIN D-3) 5000 UNITS TABS Take 1 tablet by mouth daily.   Yes [provider]  cloNIDine HCl (KAPVAY) 0.1   MG TB12 ER tablet Take 0.1 mg by mouth 2 (two) times daily. PRN   Yes [provider]  isosorbide mononitrate (IMDUR) 30 MG 24 hr tablet Take 1 tablet (30 mg total) by mouth daily. 11/01/22 01/30/23 Yes Agbor-Etang, Brian, MD  levothyroxine (SYNTHROID) 88 MCG tablet Take 1 tablet (88 mcg total) by mouth daily. For hypothyroidism 10/03/22  Yes Gutierrez, Javier, MD  linagliptin (TRADJENTA) 5 MG TABS tablet Take 1 tablet (5 mg total) by mouth daily. 07/10/22  Yes Gutierrez, Javier, MD  pantoprazole (PROTONIX) 40 MG tablet Take 1 tablet (40 mg total) by mouth daily as needed. For reflux 10/03/22  Yes Gutierrez, Javier, MD  sodium bicarbonate 650 MG tablet Take 1,300 mg by mouth 2 (two) times daily.   Yes [provider]    Social History   Socioeconomic History   Marital status: Divorced    Spouse name: Not on file   Number of children: 1   Years of education: Not on file   Highest education level: Not on file  Occupational History   Not on file  Tobacco Use   Smoking status: Former    Passive  exposure: Past   Smokeless tobacco: Never   Tobacco comments:    quit over 40 yrs ago  Vaping Use   Vaping Use: Never used  Substance and Sexual Activity   Alcohol use: Never    Comment: rare   Drug use: Never   Sexual activity: Not Currently  Other Topics Concern   Not on file  Social History Narrative   Lives alone,   Granddaughter in [place] (attorney).   Divorced   Occu: retired, worked in IT   Edu: college   Has 1 living daughter. (One passed)   Social Determinants of Health   Financial Resource Strain: Low Risk  (12/28/2021)   Overall Financial Resource Strain (CARDIA)    Difficulty of Paying Living Expenses: Not hard at all  Food Insecurity: No Food Insecurity (12/05/2022)   Hunger Vital Sign    Worried About Running Out of Food in the Last Year: Never true    Ran Out of Food in the Last Year: Never true  Transportation Needs: No Transportation Needs (12/05/2022)   PRAPARE - Transportation    Lack of Transportation (Medical): No    Lack of Transportation (Non-Medical): No  Physical Activity: Inactive (12/28/2021)   Exercise Vital Sign    Days of Exercise per Week: 0 days    Minutes of Exercise per Session: 0 min  Stress: No Stress Concern Present (12/28/2021)   [employer] - Occupational Stress Questionnaire    Feeling of Stress : Not at all  Social Connections: Not on file  Intimate Partner Violence: Not At Risk (12/05/2022)   Humiliation, Afraid, Rape, and Kick questionnaire    Fear of Current or Ex-Partner: No    Emotionally Abused: No    Physically Abused: No    Sexually Abused: No     Family History  Problem Relation Age of Onset   Diabetes Mother    Hypertension Mother    Arthritis Mother    Alcohol abuse Father    Arthritis Father    Stroke Daughter    CAD Neg Hx    Cancer Neg Hx    Breast cancer Neg Hx     ROS: Otherwise negative unless mentioned in HPI  Physical Examination  Vitals:   12/07/22 1744 12/07/22 2003  BP:  133/65 138/70  Pulse: 85 82    Resp: 16 18  Temp: 97.9 F (36.6 C) 98.4 F (36.9 C)  SpO2: 97% 98%   Body mass index is 29.41 kg/m.  General:  WDWN in NAD Gait: Not observed HENT: WNL, normocephalic Pulmonary: normal non-labored breathing, without Rales, rhonchi,  wheezing Cardiac: irregular, Hx Atrial Fibrillation, without  Murmurs, rubs or gallops; without carotid bruits Abdomen: Positive Bowel Sounds,  soft, NT/ND, no masses Skin: without rashes Vascular Exam/Pulses: Positive Palpable Pulses throughout  Extremities: without ischemic changes, without Gangrene , without cellulitis; without open wounds;  Musculoskeletal: no muscle wasting or atrophy  Neurologic: A&O X 3;  No focal weakness or paresthesias are detected; speech is fluent/normal Psychiatric:  The pt has Normal affect. Lymph:  Unremarkable  CBC    Component Value Date/Time   WBC 8.5 12/05/2022 0629   RBC 2.45 (L) 12/05/2022 0629   HGB 7.3 (L) 12/07/2022 0947   HGB 10.6 02/26/2014 0000   HCT 23.1 (L) 12/05/2022 0629   HCT 35.8 10/16/2012 1227   PLT 86 (L) 12/05/2022 0629   PLT 132 (L) 10/16/2012 1227   MCV 94.3 12/05/2022 0629   MCV 91 10/16/2012 1227   MCV 89.0 01/11/2012 0000   MCH 29.8 12/05/2022 0629   MCHC 31.6 12/05/2022 0629   RDW 14.0 12/05/2022 0629   RDW 14.0 10/16/2012 1227   LYMPHSABS 0.7 12/05/2022 0629   MONOABS 0.7 12/05/2022 0629   EOSABS 0.1 12/05/2022 0629   BASOSABS 0.0 12/05/2022 0629    BMET    Component Value Date/Time   NA 140 12/07/2022 0947   NA 140 10/16/2012 1227   K 3.9 12/07/2022 0947   K 4.3 02/26/2014 0000   CL 107 12/07/2022 0947   CL 106 10/16/2012 1227   CO2 18 (L) 12/07/2022 0947   CO2 24 10/16/2012 1227   GLUCOSE 185 (H) 12/07/2022 0947   GLUCOSE 262 (H) 10/16/2012 1227   BUN 64 (H) 12/07/2022 0947   BUN 22 (H) 10/16/2012 1227   CREATININE 7.13 (H) 12/07/2022 0947   CREATININE 4.05 02/26/2014 0000   CALCIUM 8.8 (L) 12/07/2022 0947   CALCIUM 9.0 10/16/2012  1227   GFRNONAA 5 (L) 12/07/2022 0947   GFRAA 10 (L) 01/07/2018 0859    COAGS: Lab Results  Component Value Date   INR 2.6 (H) 12/03/2022   INR 1.03 12/28/2017     Non-Invasive Vascular Imaging:   None Ordered   Statin:  No. Beta Blocker:  Yes.   Aspirin:  No. ACEI:  No. ARB:  No. CCB use:  Yes Other antiplatelets/anticoagulants:  Yes.   Eliquis 5 mg twice daily   ASSESSMENT/PLAN: This is a 81 y.o. female now with end stage renal disease requiring hemodialysis. Vascular surgery was consulted to place a Dialysis Perma Catheter.   PLAN:  Vascular surgery plans on taking the patient to the vascular lab for dialysis Perma Catheter placement for hemodialysis. I discussed in detail with the patient and her daughter at the bedside the procedure, benefits, risks and complications. Both verbalized their understanding. I answered all their questions. They wish to proceed with the procedure as soon as possible. Patient will be made NPO after midnight.    -I discussed in detail the plan with Dr Greg Schnier MD and he agrees with the plan.    Alexis Tucker R [facility] 12/07/2022 11:11 PM  

## 2022-12-07 NOTE — Progress Notes (Signed)
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  Will check a pulse ox on room air with ambulation.  Patient still undecided on dialysis overnight.  Creatinine worsened to 6.75 with a GFR of 6.  Repeat hemoglobin still low at 7.4. 6/6.  Patient had some abdominal pain today.  Abdominal x-ray read as negative.  Could be constipation related.  MiraLAX prescribed.  Will transfuse 1 unit of packed red blood cells on hemoglobin of 7.3.  Creatinine worsened to 7.13 and patient now agreeable to start dialysis.  PermCath will be placed tomorrow and dialysis will start tomorrow.  Assessment and Plan: * Acute kidney injury superimposed on CKD (HCC) Acute kidney injury on CKD stage V.  Creatinine as low as 5.95.  Today's creatinine up at 7.13.  Nephrology discussed with patient and patient's daughter about starting dialysis.  PermCath to be placed tomorrow and dialysis to start tomorrow.  Acute hypoxemic respiratory failure (HCC) Likely secondary to multifocal pneumonia.  Unable to come off oxygen yesterday.  Multifocal pneumonia Antibiotics switched over to oral Zithromax and Ceftin.  Abdominal pain Generalized.  Improved with pain medication.  X-ray officially read as negative but I think constipation may play a role in this.  Prescribed MiraLAX.  Paroxysmal atrial fibrillation (HCC) On Coreg and Eliquis  HTN (hypertension) On amlodipine and Coreg  Type 2 diabetes mellitus with other specified complication (HCC) Last hemoglobin A1c 6.1.  Post-surgical hypothyroidism On  Synthroid  Thrombocytopenia (HCC) Last platelet count 86  GERD (gastroesophageal reflux disease) On Protonix  Anemia due to stage 5 chronic kidney disease, 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) Last hemoglobin 7.3.  Will transfuse a unit of packed red blood cells today.  Benefits and risks explained to patient and patient's daughter.  Malignant neoplasm of upper-outer quadrant of left breast in female, estrogen receptor positive (HCC) Anastrozole 1 mg daily resumed        Subjective: Patient was feeling okay this morning but then late morning had abdominal pain.  This afternoon, abdominal pain improved after pain medications.  With kidney function worsening today, dialysis will be started tomorrow.  Physical Exam: Vitals:   12/07/22 0903 12/07/22 1202 12/07/22 1438 12/07/22 1502  BP: 125/62 (!) 115/55 (!) 114/59 (!) 106/56  Pulse: 66 82 73 79  Resp:  18 16 16   Temp:  98 F (36.7 C) 97.8 F (36.6 C) 98 F (36.7 C)  TempSrc:   Axillary Axillary  SpO2: 96% 93% 95% 93%  Weight:      Height:       Physical Exam HENT:     Head: Normocephalic.     Mouth/Throat:     Pharynx: No oropharyngeal exudate.  Eyes:     General: Lids are normal.     Comments: Pale conjunctiva  Cardiovascular:     Rate and Rhythm: Normal rate and regular rhythm.     Heart sounds: Normal heart sounds, S1 normal and S2 normal.  Pulmonary:     Breath sounds: Examination of the right-lower field reveals decreased breath sounds. Examination of the left-lower field reveals decreased breath sounds. Decreased  breath sounds present. No wheezing, rhonchi or rales.  Abdominal:     Palpations: Abdomen is soft.     Tenderness: There is no abdominal tenderness.  Musculoskeletal:     Right lower leg: Swelling present.     Left lower leg: Swelling present.  Skin:    General: Skin is warm.     Findings: No rash.  Neurological:     Mental Status: She is alert and oriented to person, place, and time.     Data  Reviewed: Hemoglobin 7.3, creatinine 7.13 with a BUN of 64 and CO2 down to 18 and GFR 5, abdominal x-ray read as negative.  Family Communication: Spoke with patient's daughter  Disposition: Status is: Inpatient Remains inpatient appropriate because: Will have a PermCath and dialysis starting tomorrow.  Will need 3 days of dialysis here in the hospital prior to disposition.  Transfusing 1 unit of packed red blood cells today.  Planned Discharge Destination: Home with Home Health    Time spent: 28 minutes Case discussed with vascular surgery, nephrology  Author: Alford Highland, MD 12/07/2022 4:42 PM  For on call review [email or web address].

## 2022-12-07 NOTE — Care Management Important Message (Signed)
Important Message  Patient Details  Name: Alexis Tucker MRN: [id number] Date of Birth: 27-Jun-1941   Medicare Important Message Given:  Yes  Reviewed Medicare IM with [name], daughter, in room.  Initial consent for Medicare IM signed by daughter.   Copy of Medicare IM left in room for reference, original to be scanned into chart.    [name] 12/07/2022, 2:57 PM

## 2022-12-07 NOTE — Progress Notes (Signed)
[facility]  ROUNDING NOTE   Subjective:   [name] was admitted to [facility] on 12/03/2022 for SOB (shortness of breath) [R06.02] Acute respiratory failure with hypoxia (HCC) [J96.01] Acute hypoxemic respiratory failure (HCC) [J96.01] Community acquired pneumonia of left upper lobe of lung [J18.9]  Patient sitting up in bed No family present during visit Tolerating small meals  Decreased urine output  Remains on 2L with episodes of shortness of breath  Creatinine 7.13 (6.75) (6.22)   Objective:  Vital signs in last 24 hours:  Temp:  [97.8 F (36.6 C)-98.5 F (36.9 C)] 97.8 F (36.6 C) (06/06 1438) Pulse Rate:  [66-94] 73 (06/06 1438) Resp:  [16-20] 16 (06/06 1438) BP: (106-125)/(55-94) 114/59 (06/06 1438) SpO2:  [93 %-100 %] 95 % (06/06 1438)  Weight change:  Filed Weights   12/03/22 1201  Weight: 75.3 kg    Intake/Output: I/O last 3 completed shifts: In: 480 [P.O.:480] Out: 100 [Urine:100]   Intake/Output this shift:  Total I/O In: 240 [P.O.:240] Out: -   Physical Exam: General: NAD, laying in bed  Head: Normocephalic, atraumatic. Moist oral mucosal membranes  Eyes: Anicteric  Lungs:  Clear to auscultation, [place] O2  Heart: Regular rate and rhythm  Abdomen:  Soft, nontender  Extremities:  No peripheral edema.  Neurologic: Nonfocal, moving all four extremities  Skin: No lesions  Access: AVG - no bruit or thrill    Basic Metabolic Panel: Recent Labs  Lab 12/03/22 1204 12/04/22 0506 12/05/22 0629 12/06/22 1135 12/07/22 0947  NA 140 141 141 140 140  K 4.4 4.6 4.3 3.6 3.9  CL 106 106 107 107 107  CO2 23 23 21* 23 18*  GLUCOSE 140* 146* 115* 172* 185*  BUN 51* 52* 58* 63* 64*  CREATININE 6.10* 5.95* 6.22* 6.75* 7.13*  CALCIUM 8.9 9.0 8.9 8.4* 8.8*  PHOS  --   --  4.8*  --   --      Liver Function Tests: Recent Labs  Lab 12/03/22 1545 12/05/22 0629  AST 16  --   ALT 10  --   ALKPHOS 51  --   BILITOT 1.2  --    PROT 7.1  --   ALBUMIN 3.8 3.2*    No results for input(s): "LIPASE", "AMYLASE" in the last 168 hours. No results for input(s): "AMMONIA" in the last 168 hours.  CBC: Recent Labs  Lab 12/03/22 1204 12/04/22 0506 12/05/22 0629 12/06/22 1135 12/07/22 0947  WBC 8.1 10.6* 8.5  --   --   NEUTROABS  --   --  7.0  --   --   HGB 9.2* 8.5* 7.3* 7.4* 7.3*  HCT 29.1* 27.0* 23.1*  --   --   MCV 95.4 96.4 94.3  --   --   PLT 100* 101* 86*  --   --      Cardiac Enzymes: No results for input(s): "CKTOTAL", "CKMB", "CKMBINDEX", "TROPONINI" in the last 168 hours.  BNP: Invalid input(s): "POCBNP"  CBG: No results for input(s): "GLUCAP" in the last 168 hours.  Microbiology: Results for orders placed or performed during the hospital encounter of 12/03/22  Resp panel by RT-PCR (RSV, Flu A&B, Covid) Anterior Nasal Swab     Status: None   Collection Time: 12/03/22  3:45 PM   Specimen: Anterior Nasal Swab  Result Value Ref Range Status   SARS Coronavirus 2 by RT PCR NEGATIVE NEGATIVE Final    Comment: (NOTE) SARS-CoV-2 target nucleic acids are NOT DETECTED.  The SARS-CoV-2 RNA is generally detectable in upper respiratory specimens during the acute phase of infection. The lowest concentration of SARS-CoV-2 viral copies this assay can detect is 138 copies/mL. A negative result does not preclude SARS-Cov-2 infection and should not be used as the sole basis for treatment or other patient management decisions. A negative result may occur with  improper specimen collection/handling, submission of specimen other than nasopharyngeal swab, presence of viral mutation(s) within the areas targeted by this assay, and inadequate number of viral copies(<138 copies/mL). A negative result must be combined with clinical observations, patient history, and epidemiological information. The expected result is Negative.  Fact Sheet for Patients:  BloggerCourse.com  Fact Sheet  for Healthcare Providers:  SeriousBroker.it  This test is no t yet approved or cleared by the [place] FDA and  has been authorized for detection and/or diagnosis of SARS-CoV-2 by FDA under an Emergency Use Authorization (EUA). This EUA will remain  in effect (meaning this test can be used) for the duration of the COVID-19 declaration under Section 564(b)(1) of the Act, 21 U.S.C.section 360bbb-3(b)(1), unless the authorization is terminated  or revoked sooner.       Influenza A by PCR NEGATIVE NEGATIVE Final   Influenza B by PCR NEGATIVE NEGATIVE Final    Comment: (NOTE) The Xpert Xpress SARS-CoV-2/FLU/RSV plus assay is intended as an aid in the diagnosis of influenza from Nasopharyngeal swab specimens and should not be used as a sole basis for treatment. Nasal washings and aspirates are unacceptable for Xpert Xpress SARS-CoV-2/FLU/RSV testing.  Fact Sheet for Patients: BloggerCourse.com  Fact Sheet for Healthcare Providers: SeriousBroker.it  This test is not yet approved or cleared by the [place] FDA and has been authorized for detection and/or diagnosis of SARS-CoV-2 by FDA under an Emergency Use Authorization (EUA). This EUA will remain in effect (meaning this test can be used) for the duration of the COVID-19 declaration under Section 564(b)(1) of the Act, 21 U.S.C. section 360bbb-3(b)(1), unless the authorization is terminated or revoked.     Resp Syncytial Virus by PCR NEGATIVE NEGATIVE Final    Comment: (NOTE) Fact Sheet for Patients: BloggerCourse.com  Fact Sheet for Healthcare Providers: SeriousBroker.it  This test is not yet approved or cleared by the [place] FDA and has been authorized for detection and/or diagnosis of SARS-CoV-2 by FDA under an Emergency Use Authorization (EUA). This EUA will remain in effect (meaning  this test can be used) for the duration of the COVID-19 declaration under Section 564(b)(1) of the Act, 21 U.S.C. section 360bbb-3(b)(1), unless the authorization is terminated or revoked.  Performed at [facility], [address] [place], [place] [address]   Blood Culture (routine x 2)     Status: None (Preliminary result)   Collection Time: 12/03/22  3:45 PM   Specimen: BLOOD  Result Value Ref Range Status   Specimen Description BLOOD BLOOD LEFT ARM  Final   Special Requests   Final    BOTTLES DRAWN AEROBIC AND ANAEROBIC Blood Culture adequate volume   Culture   Final    NO GROWTH 4 DAYS Performed at [facility], [address] [place], [place] [address]    Report Status PENDING  Incomplete  Blood Culture (routine x 2)     Status: None (Preliminary result)   Collection Time: 12/03/22  4:30 PM   Specimen: BLOOD  Result Value Ref Range Status   Specimen Description BLOOD BLOOD LEFT ARM  Final   Special Requests  Final    BOTTLES DRAWN AEROBIC AND ANAEROBIC Blood Culture adequate volume   Culture   Final    NO GROWTH 4 DAYS Performed at [facility], [address] [place], [place] [address]    Report Status PENDING  Incomplete    Coagulation Studies: No results for input(s): "LABPROT", "INR" in the last 72 hours.   Urinalysis: Recent Labs    12/05/22 1344  COLORURINE YELLOW*  LABSPEC 1.011  PHURINE 5.0  GLUCOSEU 50*  HGBUR SMALL*  BILIRUBINUR NEGATIVE  KETONESUR NEGATIVE  PROTEINUR 100*  NITRITE NEGATIVE  LEUKOCYTESUR NEGATIVE       Imaging: No results found.   Medications:    anticoagulant sodium citrate       allopurinol  100 mg Oral Daily   amLODipine  10 mg Oral Daily   anastrozole  1 mg Oral Daily   apixaban  2.5 mg Oral BID   carvedilol  12.5 mg Oral BID WC   cefUROXime  250 mg Oral Q supper   [START ON 12/08/2022] Chlorhexidine Gluconate Cloth  6 each Topical Q0600   furosemide  40 mg Intravenous BID    isosorbide mononitrate  30 mg Oral Daily   levothyroxine  88 mcg Oral QAC breakfast   pantoprazole  40 mg Oral QHS   polyethylene glycol  17 g Oral Daily   sodium bicarbonate  1,300 mg Oral BID   acetaminophen **OR** acetaminophen, alteplase, anticoagulant sodium citrate, heparin, hydrALAZINE, lidocaine (PF), lidocaine-prilocaine, morphine injection, ondansetron **OR** ondansetron (ZOFRAN) IV, oxyCODONE, pentafluoroprop-tetrafluoroeth, senna-docusate  Assessment/ Plan:  [name] is a 82 y.o.  female with breast cancer,  hypertension, hypothyroidism, GERD, atrial fibrillation, who was originally admitted to [facility] on 12/03/2022 for SOB (shortness of breath) [R06.02] Acute respiratory failure with hypoxia (HCC) [J96.01] Acute hypoxemic respiratory failure (HCC) [J96.01] Community acquired pneumonia of left upper lobe of lung [J18.9]  Chronic kidney disease stage V: no acute indication for dialysis.  -Due for worsening creatinine and intermittent.  For shortness of breath, we feel it is time to initiate renal replacement therapy.  Patient and family agreeable to proceed. - Will consult vascular surgery for placement of HD access. - Will initiate hemodialysis treatment tomorrow. - AV fistula in place however continues to mature. - Renal navigator aware of patient and will begin outpatient clinic search. -Will discuss home modalities that are appropriate for this patient.  Hypertension with chronic kidney disease: continue carvedilol, isosorbide mononitrate and amlodipine.   Anemia of chronic kidney disease: hemoglobin 7.3. Not receiving ESA due to active malignancy.   Acute exacerbation of chronic congestive heart failure -Will manage volume status with furosemide and hemodialysis. - Will determine appropriateness for peritoneal dialysis.   LOS: 4   6/6/20242:48 PM

## 2022-12-08 ENCOUNTER — Encounter
Admission: EM | Disposition: A | Discharge status: Home Health | Payer: Self-pay | Source: Home / Self Care | Attending: Internal Medicine

## 2022-12-08 ENCOUNTER — Inpatient Hospital Stay: Payer: Medicare Other | Admitting: Anesthesiology

## 2022-12-08 DIAGNOSIS — I5033 Acute on chronic diastolic (congestive) heart failure: Secondary | ICD-10-CM

## 2022-12-08 DIAGNOSIS — Z992 Dependence on renal dialysis: Secondary | ICD-10-CM | POA: Diagnosis not present

## 2022-12-08 DIAGNOSIS — N179 Acute kidney failure, unspecified: Secondary | ICD-10-CM | POA: Diagnosis not present

## 2022-12-08 DIAGNOSIS — J9601 Acute respiratory failure with hypoxia: Secondary | ICD-10-CM | POA: Diagnosis not present

## 2022-12-08 DIAGNOSIS — J189 Pneumonia, unspecified organism: Secondary | ICD-10-CM | POA: Diagnosis not present

## 2022-12-08 DIAGNOSIS — N186 End stage renal disease: Secondary | ICD-10-CM | POA: Diagnosis not present

## 2022-12-08 DIAGNOSIS — I480 Paroxysmal atrial fibrillation: Secondary | ICD-10-CM | POA: Diagnosis not present

## 2022-12-08 HISTORY — PX: DIALYSIS/PERMA CATHETER INSERTION: CATH118288

## 2022-12-08 LAB — BPAM RBC
Blood Product Expiration Date: 202407112359
ISSUE DATE / TIME: 202406061433

## 2022-12-08 LAB — CULTURE, BLOOD (ROUTINE X 2): Special Requests: ADEQUATE

## 2022-12-08 LAB — CBC
HCT: 26.9 % — ABNORMAL LOW (ref 36.0–46.0)
Hemoglobin: 8.6 g/dL — ABNORMAL LOW (ref 12.0–15.0)
MCH: 29.8 pg (ref 26.0–34.0)
MCHC: 32 g/dL (ref 30.0–36.0)
MCV: 93.1 fL (ref 80.0–100.0)
Platelets: 136 10*3/uL — ABNORMAL LOW (ref 150–400)
RBC: 2.89 MIL/uL — ABNORMAL LOW (ref 3.87–5.11)
RDW: 14.6 % (ref 11.5–15.5)
WBC: 4.4 10*3/uL (ref 4.0–10.5)
nRBC: 0 % (ref 0.0–0.2)

## 2022-12-08 LAB — COMPREHENSIVE METABOLIC PANEL
ALT: 10 U/L (ref 0–44)
AST: 14 U/L — ABNORMAL LOW (ref 15–41)
Albumin: 3.2 g/dL — ABNORMAL LOW (ref 3.5–5.0)
Alkaline Phosphatase: 55 U/L (ref 38–126)
Anion gap: 11 (ref 5–15)
BUN: 63 mg/dL — ABNORMAL HIGH (ref 8–23)
CO2: 24 mmol/L (ref 22–32)
Calcium: 8.6 mg/dL — ABNORMAL LOW (ref 8.9–10.3)
Chloride: 109 mmol/L (ref 98–111)
Creatinine, Ser: 6.97 mg/dL — ABNORMAL HIGH (ref 0.44–1.00)
GFR, Estimated: 6 mL/min — ABNORMAL LOW (ref >60)
Glucose, Bld: 119 mg/dL — ABNORMAL HIGH (ref 70–99)
Potassium: 3.5 mmol/L (ref 3.5–5.1)
Sodium: 144 mmol/L (ref 135–145)
Total Bilirubin: 0.6 mg/dL (ref 0.3–1.2)
Total Protein: 6.6 g/dL (ref 6.5–8.1)

## 2022-12-08 LAB — PROTEIN ELECTROPHORESIS, SERUM
A/G Ratio: 1 (ref 0.7–1.7)
Albumin ELP: 2.9 g/dL (ref 2.9–4.4)
Alpha-1-Globulin: 0.5 g/dL — ABNORMAL HIGH (ref 0.0–0.4)
Alpha-2-Globulin: 0.8 g/dL (ref 0.4–1.0)
Beta Globulin: 0.8 g/dL (ref 0.7–1.3)
Gamma Globulin: 0.9 g/dL (ref 0.4–1.8)
Globulin, Total: 2.9 g/dL (ref 2.2–3.9)
Total Protein ELP: 5.8 g/dL — ABNORMAL LOW (ref 6.0–8.5)

## 2022-12-08 LAB — GLUCOSE, CAPILLARY
Glucose-Capillary: 91 mg/dL (ref 70–99)
Glucose-Capillary: 104 mg/dL — ABNORMAL HIGH (ref 70–99)

## 2022-12-08 LAB — TYPE AND SCREEN
Antibody Screen: NEGATIVE
Unit division: 0

## 2022-12-08 SURGERY — DIALYSIS/PERMA CATHETER INSERTION
Anesthesia: Moderate Sedation

## 2022-12-08 MED ORDER — CEFAZOLIN SODIUM-DEXTROSE 1-4 GM/50ML-% IV SOLN
1.0000 g | INTRAVENOUS | Status: DC
  Filled 2022-12-08: qty 50

## 2022-12-08 MED ORDER — DIPHENHYDRAMINE HCL 50 MG/ML IJ SOLN: 50.0000 mg | Freq: Once | INTRAMUSCULAR | Status: DC | PRN

## 2022-12-08 MED ORDER — MIDAZOLAM HCL 2 MG/2ML IJ SOLN
INTRAMUSCULAR | Status: AC
  Filled 2022-12-08: qty 2

## 2022-12-08 MED ORDER — SODIUM CHLORIDE 0.9 % IV SOLN: INTRAVENOUS | Status: DC

## 2022-12-08 MED ORDER — ONDANSETRON HCL 4 MG/2ML IJ SOLN: 4.0000 mg | Freq: Four times a day (QID) | INTRAMUSCULAR | Status: DC | PRN

## 2022-12-08 MED ORDER — FAMOTIDINE 20 MG PO TABS: 40.0000 mg | ORAL_TABLET | Freq: Once | ORAL | Status: DC | PRN

## 2022-12-08 MED ORDER — HEPARIN SODIUM (PORCINE) 10000 UNIT/ML IJ SOLN
INTRAMUSCULAR | Status: AC
  Filled 2022-12-08: qty 1

## 2022-12-08 MED ORDER — METHYLPREDNISOLONE SODIUM SUCC 125 MG IJ SOLR: 125.0000 mg | Freq: Once | INTRAMUSCULAR | Status: DC | PRN

## 2022-12-08 MED ORDER — FENTANYL CITRATE (PF) 100 MCG/2ML IJ SOLN
INTRAMUSCULAR | Status: AC
  Filled 2022-12-08: qty 2

## 2022-12-08 MED ORDER — MIDAZOLAM HCL 2 MG/ML PO SYRP
8.0000 mg | ORAL_SOLUTION | Freq: Once | ORAL | Status: DC | PRN
  Filled 2022-12-08: qty 5

## 2022-12-08 MED ORDER — CEFAZOLIN SODIUM-DEXTROSE 1-4 GM/50ML-% IV SOLN
INTRAVENOUS | Status: AC
  Filled 2022-12-08: qty 50

## 2022-12-08 MED ORDER — FENTANYL CITRATE (PF) 100 MCG/2ML IJ SOLN
INTRAMUSCULAR | Status: DC | PRN
  Administered 2022-12-08: 25 ug via INTRAVENOUS

## 2022-12-08 MED ORDER — FENTANYL CITRATE PF 50 MCG/ML IJ SOSY: 12.5000 ug | PREFILLED_SYRINGE | Freq: Once | INTRAMUSCULAR | Status: DC | PRN

## 2022-12-08 MED ORDER — MIDAZOLAM HCL 2 MG/2ML IJ SOLN
INTRAMUSCULAR | Status: DC | PRN
  Administered 2022-12-08: 1 mg via INTRAVENOUS

## 2022-12-08 MED ORDER — HYDROMORPHONE HCL 1 MG/ML IJ SOLN: 1.0000 mg | Freq: Once | INTRAMUSCULAR | Status: DC | PRN

## 2022-12-08 SURGICAL SUPPLY — 11 items
ADH SKN CLS APL DERMABOND .7 (GAUZE/BANDAGES/DRESSINGS) ×1
CATH PALINDROME-P 19CM W/VT (CATHETERS) IMPLANT
COVER PROBE ULTRASOUND 5X96 (MISCELLANEOUS) IMPLANT
DERMABOND ADVANCED .7 DNX12 (GAUZE/BANDAGES/DRESSINGS) IMPLANT
DRAPE INCISE IOBAN 66X45 STRL (DRAPES) IMPLANT
NDL ENTRY 21GA 7CM ECHOTIP (NEEDLE) IMPLANT
NEEDLE ENTRY 21GA 7CM ECHOTIP (NEEDLE) ×1 IMPLANT
PACK ANGIOGRAPHY (CUSTOM PROCEDURE TRAY) IMPLANT
SET INTRO CAPELLA COAXIAL (SET/KITS/TRAYS/PACK) IMPLANT
SUT MNCRL AB 4-0 PS2 18 (SUTURE) IMPLANT
SUT SILK 0 FSL (SUTURE) IMPLANT

## 2022-12-08 NOTE — Progress Notes (Signed)
Hemodialysis note  Received patient in bed to unit. Alert and oriented.  Informed consent signed and in chart.  Treatment initiated: 1630 Treatment complete:1809  Patient tolerated well. Transported back to room, alert without acute distress.  Report given to patient's RN.   Access used: Right HD PermCath Access issues: none  Total UF removed: 0 Medication(s) given:  none  Post HD weight: 79.6 kg   Wolfgang Phoenix [facility]

## 2022-12-08 NOTE — Progress Notes (Signed)
[facility]  ROUNDING NOTE   Subjective:   [name] was admitted to [facility] on 12/03/2022 for SOB (shortness of breath) [R06.02] Acute respiratory failure with hypoxia (HCC) [J96.01] Acute hypoxemic respiratory failure (HCC) [J96.01] Community acquired pneumonia of left upper lobe of lung [J18.9]  Patient resting quietly No family present  NPO for vascular procedure  Creatinine 6.97 (7.13) (6.75) (6.22)   Objective:  Vital signs in last 24 hours:  Temp:  [97.7 F (36.5 C)-98.8 F (37.1 C)] 97.9 F (36.6 C) (06/07 1020) Pulse Rate:  [73-94] 84 (06/07 1300) Resp:  [6-24] 12 (06/07 1300) BP: (106-152)/(55-102) 147/74 (06/07 1300) SpO2:  [90 %-100 %] 95 % (06/07 1300)  Weight change:  Filed Weights   12/03/22 1201  Weight: 75.3 kg    Intake/Output: I/O last 3 completed shifts: In: 955.5 [P.O.:560; I.V.:41.5; Blood:354] Out: 175 [Urine:175]   Intake/Output this shift:  Total I/O In: -  Out: 200 [Urine:200]  Physical Exam: General: NAD, laying in bed  Head: Normocephalic, atraumatic. Moist oral mucosal membranes  Eyes: Anicteric  Lungs:  Clear to auscultation, [place] O2  Heart: Regular rate and rhythm  Abdomen:  Soft, nontender  Extremities:  trace peripheral edema.  Neurologic: Alert and oriented, moving all four extremities  Skin: No lesions  Access: AVG - no bruit or thrill    Basic Metabolic Panel: Recent Labs  Lab 12/04/22 0506 12/05/22 0629 12/06/22 1135 12/07/22 0947 12/08/22 0524  NA 141 141 140 140 144  K 4.6 4.3 3.6 3.9 3.5  CL 106 107 107 107 109  CO2 23 21* 23 18* 24  GLUCOSE 146* 115* 172* 185* 119*  BUN 52* 58* 63* 64* 63*  CREATININE 5.95* 6.22* 6.75* 7.13* 6.97*  CALCIUM 9.0 8.9 8.4* 8.8* 8.6*  PHOS  --  4.8*  --   --   --      Liver Function Tests: Recent Labs  Lab 12/03/22 1545 12/05/22 0629 12/08/22 0524  AST 16  --  14*  ALT 10  --  10  ALKPHOS 51  --  55  BILITOT 1.2  --  0.6  PROT 7.1  --  6.6   ALBUMIN 3.8 3.2* 3.2*    No results for input(s): "LIPASE", "AMYLASE" in the last 168 hours. No results for input(s): "AMMONIA" in the last 168 hours.  CBC: Recent Labs  Lab 12/03/22 1204 12/04/22 0506 12/05/22 0629 12/06/22 1135 12/07/22 0947 12/08/22 0524  WBC 8.1 10.6* 8.5  --   --  4.4  NEUTROABS  --   --  7.0  --   --   --   HGB 9.2* 8.5* 7.3* 7.4* 7.3* 8.6*  HCT 29.1* 27.0* 23.1*  --   --  26.9*  MCV 95.4 96.4 94.3  --   --  93.1  PLT 100* 101* 86*  --   --  136*     Cardiac Enzymes: No results for input(s): "CKTOTAL", "CKMB", "CKMBINDEX", "TROPONINI" in the last 168 hours.  BNP: Invalid input(s): "POCBNP"  CBG: Recent Labs  Lab 12/08/22 0857 12/08/22 1018  GLUCAP 91 104*    Microbiology: Results for orders placed or performed during the hospital encounter of 12/03/22  Resp panel by RT-PCR (RSV, Flu A&B, Covid) Anterior Nasal Swab     Status: None   Collection Time: 12/03/22  3:45 PM   Specimen: Anterior Nasal Swab  Result Value Ref Range Status   SARS Coronavirus 2 by RT PCR NEGATIVE NEGATIVE Final  Comment: (NOTE) SARS-CoV-2 target nucleic acids are NOT DETECTED.  The SARS-CoV-2 RNA is generally detectable in upper respiratory specimens during the acute phase of infection. The lowest concentration of SARS-CoV-2 viral copies this assay can detect is 138 copies/mL. A negative result does not preclude SARS-Cov-2 infection and should not be used as the sole basis for treatment or other patient management decisions. A negative result may occur with  improper specimen collection/handling, submission of specimen other than nasopharyngeal swab, presence of viral mutation(s) within the areas targeted by this assay, and inadequate number of viral copies(<138 copies/mL). A negative result must be combined with clinical observations, patient history, and epidemiological information. The expected result is Negative.  Fact Sheet for Patients:   BloggerCourse.com  Fact Sheet for Healthcare Providers:  SeriousBroker.it  This test is no t yet approved or cleared by the [place] FDA and  has been authorized for detection and/or diagnosis of SARS-CoV-2 by FDA under an Emergency Use Authorization (EUA). This EUA will remain  in effect (meaning this test can be used) for the duration of the COVID-19 declaration under Section 564(b)(1) of the Act, 21 U.S.C.section 360bbb-3(b)(1), unless the authorization is terminated  or revoked sooner.       Influenza A by PCR NEGATIVE NEGATIVE Final   Influenza B by PCR NEGATIVE NEGATIVE Final    Comment: (NOTE) The Xpert Xpress SARS-CoV-2/FLU/RSV plus assay is intended as an aid in the diagnosis of influenza from Nasopharyngeal swab specimens and should not be used as a sole basis for treatment. Nasal washings and aspirates are unacceptable for Xpert Xpress SARS-CoV-2/FLU/RSV testing.  Fact Sheet for Patients: BloggerCourse.com  Fact Sheet for Healthcare Providers: SeriousBroker.it  This test is not yet approved or cleared by the [place] FDA and has been authorized for detection and/or diagnosis of SARS-CoV-2 by FDA under an Emergency Use Authorization (EUA). This EUA will remain in effect (meaning this test can be used) for the duration of the COVID-19 declaration under Section 564(b)(1) of the Act, 21 U.S.C. section 360bbb-3(b)(1), unless the authorization is terminated or revoked.     Resp Syncytial Virus by PCR NEGATIVE NEGATIVE Final    Comment: (NOTE) Fact Sheet for Patients: BloggerCourse.com  Fact Sheet for Healthcare Providers: SeriousBroker.it  This test is not yet approved or cleared by the [place] FDA and has been authorized for detection and/or diagnosis of SARS-CoV-2 by FDA under an Emergency Use  Authorization (EUA). This EUA will remain in effect (meaning this test can be used) for the duration of the COVID-19 declaration under Section 564(b)(1) of the Act, 21 U.S.C. section 360bbb-3(b)(1), unless the authorization is terminated or revoked.  Performed at [facility], [address] [place], [place] [address]   Blood Culture (routine x 2)     Status: None   Collection Time: 12/03/22  3:45 PM   Specimen: BLOOD  Result Value Ref Range Status   Specimen Description BLOOD BLOOD LEFT ARM  Final   Special Requests   Final    BOTTLES DRAWN AEROBIC AND ANAEROBIC Blood Culture adequate volume   Culture   Final    NO GROWTH 5 DAYS Performed at [facility], [address] [place], [place] [address]    Report Status 12/08/2022 FINAL  Final  Blood Culture (routine x 2)     Status: None   Collection Time: 12/03/22  4:30 PM   Specimen: BLOOD  Result Value Ref Range Status   Specimen Description BLOOD BLOOD LEFT ARM  Final   Special Requests   Final    BOTTLES DRAWN AEROBIC AND ANAEROBIC Blood Culture adequate volume   Culture   Final    NO GROWTH 5 DAYS Performed at [facility], [address] [place], [place] [address]    Report Status 12/08/2022 FINAL  Final    Coagulation Studies: No results for input(s): "LABPROT", "INR" in the last 72 hours.   Urinalysis: Recent Labs    12/05/22 1344  COLORURINE YELLOW*  LABSPEC 1.011  PHURINE 5.0  GLUCOSEU 50*  HGBUR SMALL*  BILIRUBINUR NEGATIVE  KETONESUR NEGATIVE  PROTEINUR 100*  NITRITE NEGATIVE  LEUKOCYTESUR NEGATIVE       Imaging: PERIPHERAL VASCULAR CATHETERIZATION  Result Date: 12/08/2022 See surgical note for result.  DG Abd 2 Views  Result Date: 12/07/2022 CLINICAL DATA:  Abdominal pain. EXAM: ABDOMEN - 2 VIEW COMPARISON:  None. FINDINGS: Normal bowel gas pattern. No free air or bowel air-fluid levels. Right mid abdomen bowel anastomosis staples. Mild colonic stool burden.  Previous cholecystectomy. Scattered calcifications that appear to be vascular. No convincing renal or ureteral stones. No acute skeletal abnormality. IMPRESSION: 1. No acute findings. No evidence of bowel obstruction. No free air. Electronically Signed   By: Amie Portland M.D.   On: 12/07/2022 16:10     Medications:    sodium chloride 10 mL/hr at 12/08/22 0324   sodium chloride 10 mL/hr at 12/08/22 1015   [MAR Hold] anticoagulant sodium citrate      ceFAZolin (ANCEF) IV       [MAR Hold] allopurinol  100 mg Oral Daily   [MAR Hold] amLODipine  10 mg Oral Daily   [MAR Hold] anastrozole  1 mg Oral Daily   [MAR Hold] apixaban  2.5 mg Oral BID   [MAR Hold] carvedilol  12.5 mg Oral BID WC   [MAR Hold] cefUROXime  250 mg Oral Q supper   [MAR Hold] Chlorhexidine Gluconate Cloth  6 each Topical Q0600   [MAR Hold] furosemide  40 mg Intravenous BID   [MAR Hold] isosorbide mononitrate  30 mg Oral Daily   [MAR Hold] levothyroxine  88 mcg Oral QAC breakfast   [MAR Hold] pantoprazole  40 mg Oral QHS   [MAR Hold] polyethylene glycol  17 g Oral Daily   [MAR Hold] sodium bicarbonate  1,300 mg Oral BID   [MAR Hold] acetaminophen **OR** [MAR Hold] acetaminophen, [MAR Hold] alteplase, [MAR Hold] anticoagulant sodium citrate, diphenhydrAMINE, famotidine, fentaNYL (SUBLIMAZE) injection, fentaNYL, [MAR Hold] heparin, HYDROmorphone (DILAUDID) injection, [MAR Hold] lidocaine (PF), [MAR Hold] lidocaine-prilocaine, methylPREDNISolone (SOLU-MEDROL) injection, midazolam, midazolam, [MAR Hold]  morphine injection, [MAR Hold] ondansetron **OR** [MAR Hold] ondansetron (ZOFRAN) IV, ondansetron (ZOFRAN) IV, [MAR Hold] oxyCODONE, [MAR Hold] pentafluoroprop-tetrafluoroeth, [MAR Hold] senna-docusate  Assessment/ Plan:  [name] is a 82 y.o.  female with breast cancer,  hypertension, hypothyroidism, GERD, atrial fibrillation, who was originally admitted to [facility] on 12/03/2022 for SOB (shortness of breath)  [R06.02] Acute respiratory failure with hypoxia (HCC) [J96.01] Acute hypoxemic respiratory failure (HCC) [J96.01] Community acquired pneumonia of left upper lobe of lung [J18.9]  Chronic kidney disease stage V: Appears to be progression of disease  -Due for worsening creatinine and intermittent.  shortness of breath, we feel it is time to initiate renal replacement therapy.  Patient and family agreeable to proceed. - Vascular surgery will place permcath today.  -Plan to provide first dialysis treatment today with additional treatment tomorrow.  -Will consider home modalities at later time  Hypertension with chronic  kidney disease: continue carvedilol, isosorbide mononitrate and amlodipine.  Blood pressure acceptable for this patient  Anemia of chronic kidney disease: hemoglobin improved to 8.6. Not receiving ESA due to active malignancy.   Acute exacerbation of chronic congestive heart failure -Continue fluid management with furosemide and hemodialysis. - Will determine appropriateness for peritoneal dialysis.   LOS: 5   6/7/20241:17 PM

## 2022-12-08 NOTE — Interval H&P Note (Signed)
History and Physical Interval Note:  12/08/2022 10:15 AM  [name]  has presented today for surgery, with the diagnosis of ESRD.  The various methods of treatment have been discussed with the patient and family. After consideration of risks, benefits and other options for treatment, the patient has consented to  Procedure(s): DIALYSIS/PERMA CATHETER INSERTION (N/A) as a surgical intervention.  The patient's history has been reviewed, patient examined, no change in status, stable for surgery.  I have reviewed the patient's chart and labs.  Questions were answered to the patient's satisfaction.     [name]

## 2022-12-08 NOTE — Assessment & Plan Note (Addendum)
Patient has been on IV Lasix.  Patient will will be started on dialysis to help manage fluid status.  Patient on Coreg.

## 2022-12-08 NOTE — Progress Notes (Signed)
PT Cancellation Note  Patient Details Name: Alexis Tucker MRN: [id number] DOB: 09-17-1940   Cancelled Treatment:    Reason Eval/Treat Not Completed: Patient at procedure or test/unavailable (Author returned to see if pt ready for PT. Pt now off unit for 1st HD trial. Will continue to follow, attempt treatment again at later date/time.)  4:46 PM, 12/08/22 Rosamaria Lints, PT, DPT Physical Therapist - [facility]  [phone] (ASCOM)    Harlo Jaso C 12/08/2022, 4:46 PM

## 2022-12-08 NOTE — Op Note (Signed)
[facility]   OPERATIVE NOTE     PROCEDURE: 1. Insertion of a 19 cm tip to cuff tunneled dialysis catheter. 2. Catheter placement and cannulation under ultrasound and fluoroscopic guidance  PRE-OPERATIVE DIAGNOSIS: end-stage renal requiring hemodialysis  POST-OPERATIVE DIAGNOSIS: same as above  SURGEON: [name]  ANESTHESIA: Conscious sedation was administered under my direct supervision by the interventional radiology RN. IV Versed plus fentanyl were utilized. Continuous ECG, pulse oximetry and blood pressure was monitored throughout the entire procedure.  Conscious sedation was for a total of 28 minutes and 16 seconds.  ESTIMATED BLOOD LOSS: Minimal  FINDING(S): 1.  Tips of the catheter in the right atrium on fluoroscopy 2.  No obvious pneumothorax on fluoroscopy  SPECIMEN(S):  none  INDICATIONS:   Alexis Tucker is a 82 y.o. female  presents with end stage renal disease.  Therefore, the patient requires a tunneled dialysis catheter placement.  The patient is informed of  the risks catheter placement include but are not limited to: bleeding, infection, central venous injury, pneumothorax, possible venous stenosis, possible malpositioning in the venous system, and possible infections related to long-term catheter presence.  The patient was aware of these risks and agreed to proceed.  DESCRIPTION: The patient was taken back to Special Procedure suite.  Prior to sedation, the patient was given IV antibiotics.  After obtaining adequate sedation, the patient was prepped and draped in the standard fashion for a right IJ tunneled dialysis catheter placement.  Appropriate Time Out is called.     The right neck and chest wall are then infiltrated with 1% Lidocaine with epinepherine.  A 19 cm tip to cuff catheter is then selected, opened on the back table and prepped. Ultrasound is placed in a sterile sleeve.  Under ultrasound guidance, the right IJ vein  is examined and is noted to be echolucent and easily compressible indicating patency.   An image is recorded for the permanent record.  The right IJ vein is cannulated with the microneedle under direct ultrasound vissualization.  A Microwire followed by a micro sheath is then inserted without difficulty.   A J-wire was then advanced under fluoroscopic guidance into the inferior vena cava and the wire was secured.  Small counter incision was then made at the wire insertion site. A small pocket was fashioned with blunt dissection to allow easier passage of the cuff.  The dilator and peel-away sheath are then advanced over the wire under fluoroscopic guidance. The catheters and advanced through the peel-away sheath. It is approximated to the right chest wall after verifying the tips at the atrial caval junction and an exit site is selected.  Small incision is made at the selected exit site and the tunneling device was passed subcutaneously to the counter incision. Catheter is then pulled through the subcutaneous tunnel. The catheter is then verified for tip position under fluoroscopy, transected and the hub assembly connected.    Each port was tested by aspirating and flushing.  No resistance was noted.  Each port was then thoroughly flushed with heparinized saline.  The catheter was secured in placed with two interrupted stitches of 0 silk tied to the catheter.  The counter incision was closed with a U-stitch of 4-0 Monocryl.  The insertion site is then cleaned and sterile bandages applied including a Biopatch.  Each port was then packed with concentrated heparin (1000 Units/mL) at the manufacturer recommended volumes to each port.  Sterile caps were applied to each port.  On  completion fluoroscopy, the tips of the catheter were in the right atrium, and there was no evidence of pneumothorax.  COMPLICATIONS: None  CONDITION: Unchanged   [name] [facility] Office:  [phone]   12/08/2022, 2:23 PM

## 2022-12-08 NOTE — Progress Notes (Signed)
PT Cancellation Note  Patient Details Name: Alexis Tucker MRN: [id number] DOB: 09-16-1940   Cancelled Treatment:    Reason Eval/Treat Not Completed: Patient at procedure or test/unavailable (Pt went for IJ permacath placement today. WIll await updated PT orders once pt is medially ready to resume working with our services.)  3:00 PM, 12/08/22 Rosamaria Lints, PT, DPT Physical Therapist - [facility]  [phone] (ASCOM)    Josey Dettmann C 12/08/2022, 3:00 PM

## 2022-12-08 NOTE — Progress Notes (Signed)
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  Will check a pulse ox on room air with ambulation.  Patient still undecided on dialysis overnight.  Creatinine worsened to 6.75 with a GFR of 6.  Repeat hemoglobin still low at 7.4. 6/6.  Patient had some abdominal pain today.  Abdominal x-ray read as negative.  Could be constipation related.  MiraLAX prescribed.  Will transfuse 1 unit of packed red blood cells on hemoglobin of 7.3.  Creatinine worsened to 7.13 and patient now agreeable to start dialysis.  PermCath will be placed tomorrow and dialysis will start tomorrow. 6/7.  No abdominal pain this morning.  Hemoglobin up to 8.6 with transfusion yesterday.  PermCath placement today.  Will start dialysis today.  Assessment and Plan: * Acute kidney injury superimposed on CKD (HCC) Acute kidney injury on CKD stage V.  Creatinine as low as 5.95.  Today's creatinine up at 6.97.  PermCath placed today.  Dialysis to start today.  Acute respiratory failure with hypoxia (HCC) Likely secondary to multifocal pneumonia.   Multifocal pneumonia Antibiotics switched over to oral Ceftin.  Completed Zithromax.  Abdominal pain Generalized.  Improved with pain medication.  X-ray officially read as negative but I think constipation may play a role in this.  Prescribed MiraLAX.  No abdominal pain this morning.  Acute on chronic diastolic CHF (congestive heart failure) (HCC) Patient has been on IV Lasix.  Patient will will be started on dialysis to help manage fluid status.  Patient on  Coreg.  Paroxysmal atrial fibrillation (HCC) On Coreg and Eliquis  HTN (hypertension) On amlodipine and Coreg  Type 2 diabetes mellitus with other specified complication (HCC) Last hemoglobin A1c 6.1.  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  Malignant neoplasm of upper-outer quadrant of left breast in female, estrogen receptor positive (HCC) Anastrozole 1 mg daily resumed        Subjective: Patient seen this morning.  Had no further abdominal pain.  Patient had PermCath done and will go for dialysis today.  Admitted with acute respiratory failure.  Physical Exam: Vitals:   12/08/22 1250 12/08/22 1300 12/08/22 1315 12/08/22 1401  BP:  (!) 147/74 (!) 151/79 (!) 152/79  Pulse: 82 84 85 73  Resp: 13 12 13 19   Temp:    97.8 F (36.6 C)  TempSrc:      SpO2: 97% 95% 96% 91%  Weight:      Height:       Physical Exam HENT:     Head: Normocephalic.     Mouth/Throat:     Pharynx: No oropharyngeal exudate.  Eyes:     General: Lids are normal.     Conjunctiva/sclera: Conjunctivae normal.  Cardiovascular:     Rate and Rhythm: Normal rate and regular rhythm.     Heart sounds: Normal heart sounds, S1 normal and S2 normal.  Pulmonary:     Breath sounds:  Examination of the right-lower field reveals decreased breath sounds. Examination of the left-lower field reveals decreased breath sounds. Decreased breath sounds present. No wheezing, rhonchi or rales.  Abdominal:     Palpations: Abdomen is soft.     Tenderness: There is no abdominal tenderness.  Musculoskeletal:     Right lower leg: Swelling present.     Left lower leg: Swelling present.  Skin:    General: Skin is warm.     Findings: No rash.  Neurological:     Mental Status: She  is alert and oriented to person, place, and time.     Data Reviewed: Creatinine 6.97, hemoglobin 8.6, platelet count 136  Family Communication: Updated patient's daughter on the phone  Disposition: Status is: Inpatient Remains inpatient appropriate because: Had PermCath today and will start dialysis  Planned Discharge Destination: Home with Home Health    Time spent: 28 minutes  Author: Alford Highland, MD 12/08/2022 2:01 PM  For on call review [email or web address].

## 2022-12-09 DIAGNOSIS — J9601 Acute respiratory failure with hypoxia: Secondary | ICD-10-CM | POA: Diagnosis not present

## 2022-12-09 DIAGNOSIS — I5033 Acute on chronic diastolic (congestive) heart failure: Secondary | ICD-10-CM | POA: Diagnosis not present

## 2022-12-09 DIAGNOSIS — J189 Pneumonia, unspecified organism: Secondary | ICD-10-CM | POA: Diagnosis not present

## 2022-12-09 DIAGNOSIS — N179 Acute kidney failure, unspecified: Secondary | ICD-10-CM | POA: Diagnosis not present

## 2022-12-09 LAB — CBC
HCT: 32.3 % — ABNORMAL LOW (ref 36.0–46.0)
Hemoglobin: 10.3 g/dL — ABNORMAL LOW (ref 12.0–15.0)
MCH: 29.8 pg (ref 26.0–34.0)
MCHC: 31.9 g/dL (ref 30.0–36.0)
MCV: 93.4 fL (ref 80.0–100.0)
Platelets: 135 10*3/uL — ABNORMAL LOW (ref 150–400)
RBC: 3.46 MIL/uL — ABNORMAL LOW (ref 3.87–5.11)
RDW: 14.4 % (ref 11.5–15.5)
WBC: 5.5 10*3/uL (ref 4.0–10.5)
nRBC: 0 % (ref 0.0–0.2)

## 2022-12-09 LAB — BASIC METABOLIC PANEL
Anion gap: 13 (ref 5–15)
BUN: 42 mg/dL — ABNORMAL HIGH (ref 8–23)
CO2: 25 mmol/L (ref 22–32)
Calcium: 8.6 mg/dL — ABNORMAL LOW (ref 8.9–10.3)
Chloride: 102 mmol/L (ref 98–111)
Creatinine, Ser: 5.18 mg/dL — ABNORMAL HIGH (ref 0.44–1.00)
GFR, Estimated: 8 mL/min — ABNORMAL LOW (ref >60)
Glucose, Bld: 106 mg/dL — ABNORMAL HIGH (ref 70–99)
Potassium: 3.6 mmol/L (ref 3.5–5.1)
Sodium: 140 mmol/L (ref 135–145)

## 2022-12-09 MED ORDER — OXYCODONE HCL 5 MG PO TABS: 5.0000 mg | ORAL_TABLET | Freq: Four times a day (QID) | ORAL | Status: DC | PRN

## 2022-12-09 MED ORDER — ACETAMINOPHEN 325 MG PO TABS
650.0000 mg | ORAL_TABLET | Freq: Four times a day (QID) | ORAL | Status: DC | PRN
  Filled 2022-12-09: qty 2

## 2022-12-09 NOTE — Evaluation (Signed)
Occupational Therapy Evaluation Patient Details Name: Alexis Tucker MRN: [id number] DOB: Nov 18, 1940 Today's Date: 12/09/2022   History of Present Illness Patient is a 82 year old female presenting with concerns of labored breathing, short of breath, hypoxia. 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   Clinical Impression   Patient presenting with decreased Ind in self care,balance, functional mobility/transfers, endurance, and safety awareness. Patient reports living at home with daughter and using [facility] for mobility at baseline. Pt endorses beig Ind with self care tasks but chart review reports pt needing intermittent assist from daughter. Daughter performs all IADL tasks. Patient currently functioning at min A for mobility and self care needs. Pt fatigues quickly and appears to have increased confusion.  Patient will benefit from acute OT to increase overall independence in the areas of ADLs, functional mobility, and safety awareness in order to safely discharge.    Recommendations for follow up therapy are one component of a multi-disciplinary discharge planning process, led by the attending physician.  Recommendations may be updated based on patient status, additional functional criteria and insurance authorization.   Assistance Recommended at Discharge Frequent or constant Supervision/Assistance  Patient can return home with the following A little help with walking and/or transfers;A little help with bathing/dressing/bathroom;Assistance with cooking/housework;Assist for transportation;Help with stairs or ramp for entrance;Direct supervision/assist for financial management;Direct supervision/assist for medications management    Functional Status Assessment  Patient has had a recent decline in their functional status and demonstrates the ability to make  significant improvements in function in a reasonable and predictable amount of time.  Equipment Recommendations  Other (comment) (defer to next venue of care)       Precautions / Restrictions Precautions Precautions: Fall Precaution Comments: vision impaired      Mobility Bed Mobility Overal bed mobility: Needs Assistance Bed Mobility: Supine to Sit, Sit to Supine     Supine to sit: Min assist Sit to supine: Min guard        Transfers Overall transfer level: Needs assistance Equipment used: 1 person hand held assist Transfers: Bed to chair/wheelchair/BSC, Sit to/from Stand Sit to Stand: Min assist     Step pivot transfers: Min assist            Balance Overall balance assessment: Needs assistance Sitting-balance support: Feet supported Sitting balance-Leahy Scale: Good     Standing balance support: Single extremity supported Standing balance-Leahy Scale: Fair                             ADL either performed or assessed with clinical judgement   ADL Overall ADL's : Needs assistance/impaired     Grooming: Wash/dry hands;Wash/dry face;Sitting;Supervision/safety;Set up                   Toilet Transfer: Minimal assistance;Ambulation Toilet Transfer Details (indicate cue type and reason): hand held assistance                 Vision Baseline Vision/History: 2 Legally blind Patient Visual Report: No change from baseline Additional Comments: Pt endorses seeing some colors and shapes.            Pertinent Vitals/Pain Pain Assessment Pain Assessment: No/denies pain     Hand Dominance Left   Extremity/Trunk Assessment Upper Extremity Assessment Upper Extremity Assessment: Generalized weakness   Lower Extremity Assessment Lower Extremity  Assessment: Generalized weakness       Communication Communication Communication: No difficulties   Cognition Arousal/Alertness: Awake/alert Behavior During Therapy: WFL for tasks  assessed/performed Overall Cognitive Status: No family/caregiver present to determine baseline cognitive functioning                                 General Comments: Pt is oriented to self and location but reports it being "September" and is unaware of why she is in hospital as well as HD when asked about it. She is pleasant and cooperative and follows commands with increased time to process.                Home Living Family/patient expects to be discharged to:: Private residence Living Arrangements: Children Available Help at Discharge: Family Type of Home: House Home Access: Level entry     Home Layout: Two level Alternate Level Stairs-Number of Steps: 13   Bathroom Shower/Tub: Chief Strategy Officer: Standard Bathroom Accessibility: No   Home Equipment: Agricultural consultant (2 wheels);Cane - single point;Shower seat          Prior Functioning/Environment Prior Level of Function : Needs assist             Mobility Comments: Mod I using SPC. can typically walk to the bathroom at home ADLs Comments: Pt reports being Ind in self care but chart reports intermittent assist from daughter as needed        OT Problem List: Decreased strength;Decreased activity tolerance;Decreased safety awareness;Impaired balance (sitting and/or standing);Decreased knowledge of use of DME or AE;Cardiopulmonary status limiting activity;Decreased cognition      OT Treatment/Interventions: Self-care/ADL training;Therapeutic exercise;Therapeutic activities;Energy conservation;DME and/or AE instruction;Patient/family education;Balance training    OT Goals(Current goals can be found in the care plan section) Acute Rehab OT Goals Patient Stated Goal: to get stronger and return to PLOF OT Goal Formulation: With patient Time For Goal Achievement: 12/23/22 Potential to Achieve Goals: Fair ADL Goals Pt Will Perform Grooming: with supervision;standing Pt Will Perform Lower  Body Dressing: with supervision;sit to/from stand Pt Will Transfer to Toilet: with supervision;ambulating Pt Will Perform Toileting - Clothing Manipulation and hygiene: with supervision;sit to/from stand  OT Frequency: Min 2X/week       AM-PAC OT "6 Clicks" Daily Activity     Outcome Measure Help from another person eating meals?: None Help from another person taking care of personal grooming?: A Little Help from another person toileting, which includes using toliet, bedpan, or urinal?: A Little Help from another person bathing (including washing, rinsing, drying)?: A Little Help from another person to put on and taking off regular upper body clothing?: A Little Help from another person to put on and taking off regular lower body clothing?: A Little 6 Click Score: 19   End of Session Equipment Utilized During Treatment: Oxygen Nurse Communication: Mobility status  Activity Tolerance: Patient tolerated treatment well Patient left: in bed;with call bell/phone within reach;with bed alarm set  OT Visit Diagnosis: Unsteadiness on feet (R26.81);Muscle weakness (generalized) (M62.81);Repeated falls (R29.6)                Time: 4782-9562 OT Time Calculation (min): 17 min Charges:  OT General Charges $OT Visit: 1 Visit OT Evaluation $OT Eval Moderate Complexity: 1 Mod OT Treatments $Self Care/Home Management : 8-22 mins  Jackquline Denmark, MS, OTR/L , CBIS ascom [phone]  12/09/22, 11:03 AM

## 2022-12-09 NOTE — Progress Notes (Signed)
[facility]  ROUNDING NOTE   Subjective:   [name] was admitted to [facility] on 12/03/2022 for SOB (shortness of breath) [R06.02] Acute respiratory failure with hypoxia (HCC) [J96.01] Acute hypoxemic respiratory failure (HCC) [J96.01] Community acquired pneumonia of left upper lobe of lung [J18.9]  Patient seen resting quietly No family present Alert but disoriented on details Does not remember having initial dialysis treatment yesterday Remains on 2 L nasal cannula No lower extremity edema   Objective:  Vital signs in last 24 hours:  Temp:  [97.8 F (36.6 C)-98.9 F (37.2 C)] 97.9 F (36.6 C) (06/08 1206) Pulse Rate:  [73-93] 78 (06/08 1206) Resp:  [8-20] 18 (06/08 1206) BP: (117-162)/(62-118) 135/62 (06/08 1206) SpO2:  [91 %-99 %] 93 % (06/08 1206) Weight:  [79.6 kg] 79.6 kg (06/07 1830)  Weight change:  Filed Weights   12/03/22 1201 12/08/22 1622 12/08/22 1830  Weight: 75.3 kg 79.6 kg 79.6 kg    Intake/Output: I/O last 3 completed shifts: In: 521.5 [P.O.:480; I.V.:41.5] Out: 825 [Urine:825]   Intake/Output this shift:  Total I/O In: 600 [P.O.:600] Out: 400 [Urine:400]  Physical Exam: General: NAD, laying in bed  Head: Normocephalic, atraumatic. Moist oral mucosal membranes  Eyes: Anicteric  Lungs:  Clear to auscultation, [place] O2  Heart: Regular rate and rhythm  Abdomen:  Soft, nontender  Extremities:  trace peripheral edema.  Neurologic: Alert and oriented, moving all four extremities  Skin: No lesions  Access: AVG - no bruit or thrill, right chest PermCath placed on 12/08/2022    Basic Metabolic Panel: Recent Labs  Lab 12/05/22 0629 12/06/22 1135 12/07/22 0947 12/08/22 0524 12/09/22 0529  NA 141 140 140 144 140  K 4.3 3.6 3.9 3.5 3.6  CL 107 107 107 109 102  CO2 21* 23 18* 24 25  GLUCOSE 115* 172* 185* 119* 106*  BUN 58* 63* 64* 63* 42*  CREATININE 6.22* 6.75* 7.13* 6.97* 5.18*  CALCIUM 8.9 8.4* 8.8* 8.6* 8.6*  PHOS  4.8*  --   --   --   --      Liver Function Tests: Recent Labs  Lab 12/03/22 1545 12/05/22 0629 12/08/22 0524  AST 16  --  14*  ALT 10  --  10  ALKPHOS 51  --  55  BILITOT 1.2  --  0.6  PROT 7.1  --  6.6  ALBUMIN 3.8 3.2* 3.2*    No results for input(s): "LIPASE", "AMYLASE" in the last 168 hours. No results for input(s): "AMMONIA" in the last 168 hours.  CBC: Recent Labs  Lab 12/03/22 1204 12/04/22 0506 12/05/22 0629 12/06/22 1135 12/07/22 0947 12/08/22 0524 12/09/22 0529  WBC 8.1 10.6* 8.5  --   --  4.4 5.5  NEUTROABS  --   --  7.0  --   --   --   --   HGB 9.2* 8.5* 7.3* 7.4* 7.3* 8.6* 10.3*  HCT 29.1* 27.0* 23.1*  --   --  26.9* 32.3*  MCV 95.4 96.4 94.3  --   --  93.1 93.4  PLT 100* 101* 86*  --   --  136* 135*     Cardiac Enzymes: No results for input(s): "CKTOTAL", "CKMB", "CKMBINDEX", "TROPONINI" in the last 168 hours.  BNP: Invalid input(s): "POCBNP"  CBG: Recent Labs  Lab 12/08/22 0857 12/08/22 1018  GLUCAP 91 104*     Microbiology: Results for orders placed or performed during the hospital encounter of 12/03/22  Resp panel by RT-PCR (  RSV, Flu A&B, Covid) Anterior Nasal Swab     Status: None   Collection Time: 12/03/22  3:45 PM   Specimen: Anterior Nasal Swab  Result Value Ref Range Status   SARS Coronavirus 2 by RT PCR NEGATIVE NEGATIVE Final    Comment: (NOTE) SARS-CoV-2 target nucleic acids are NOT DETECTED.  The SARS-CoV-2 RNA is generally detectable in upper respiratory specimens during the acute phase of infection. The lowest concentration of SARS-CoV-2 viral copies this assay can detect is 138 copies/mL. A negative result does not preclude SARS-Cov-2 infection and should not be used as the sole basis for treatment or other patient management decisions. A negative result may occur with  improper specimen collection/handling, submission of specimen other than nasopharyngeal swab, presence of viral mutation(s) within the areas  targeted by this assay, and inadequate number of viral copies(<138 copies/mL). A negative result must be combined with clinical observations, patient history, and epidemiological information. The expected result is Negative.  Fact Sheet for Patients:  BloggerCourse.com  Fact Sheet for Healthcare Providers:  SeriousBroker.it  This test is no t yet approved or cleared by the [place] FDA and  has been authorized for detection and/or diagnosis of SARS-CoV-2 by FDA under an Emergency Use Authorization (EUA). This EUA will remain  in effect (meaning this test can be used) for the duration of the COVID-19 declaration under Section 564(b)(1) of the Act, 21 U.S.C.section 360bbb-3(b)(1), unless the authorization is terminated  or revoked sooner.       Influenza A by PCR NEGATIVE NEGATIVE Final   Influenza B by PCR NEGATIVE NEGATIVE Final    Comment: (NOTE) The Xpert Xpress SARS-CoV-2/FLU/RSV plus assay is intended as an aid in the diagnosis of influenza from Nasopharyngeal swab specimens and should not be used as a sole basis for treatment. Nasal washings and aspirates are unacceptable for Xpert Xpress SARS-CoV-2/FLU/RSV testing.  Fact Sheet for Patients: BloggerCourse.com  Fact Sheet for Healthcare Providers: SeriousBroker.it  This test is not yet approved or cleared by the [place] FDA and has been authorized for detection and/or diagnosis of SARS-CoV-2 by FDA under an Emergency Use Authorization (EUA). This EUA will remain in effect (meaning this test can be used) for the duration of the COVID-19 declaration under Section 564(b)(1) of the Act, 21 U.S.C. section 360bbb-3(b)(1), unless the authorization is terminated or revoked.     Resp Syncytial Virus by PCR NEGATIVE NEGATIVE Final    Comment: (NOTE) Fact Sheet for  Patients: BloggerCourse.com  Fact Sheet for Healthcare Providers: SeriousBroker.it  This test is not yet approved or cleared by the [place] FDA and has been authorized for detection and/or diagnosis of SARS-CoV-2 by FDA under an Emergency Use Authorization (EUA). This EUA will remain in effect (meaning this test can be used) for the duration of the COVID-19 declaration under Section 564(b)(1) of the Act, 21 U.S.C. section 360bbb-3(b)(1), unless the authorization is terminated or revoked.  Performed at [facility], [address] [place], [place] [address]   Blood Culture (routine x 2)     Status: None   Collection Time: 12/03/22  3:45 PM   Specimen: BLOOD  Result Value Ref Range Status   Specimen Description BLOOD BLOOD LEFT ARM  Final   Special Requests   Final    BOTTLES DRAWN AEROBIC AND ANAEROBIC Blood Culture adequate volume   Culture   Final    NO GROWTH 5 DAYS Performed at [facility], [address], [place], [place] [address]  Report Status 12/08/2022 FINAL  Final  Blood Culture (routine x 2)     Status: None   Collection Time: 12/03/22  4:30 PM   Specimen: BLOOD  Result Value Ref Range Status   Specimen Description BLOOD BLOOD LEFT ARM  Final   Special Requests   Final    BOTTLES DRAWN AEROBIC AND ANAEROBIC Blood Culture adequate volume   Culture   Final    NO GROWTH 5 DAYS Performed at [facility], [address] [place], [place] [address]    Report Status 12/08/2022 FINAL  Final    Coagulation Studies: No results for input(s): "LABPROT", "INR" in the last 72 hours.   Urinalysis: No results for input(s): "COLORURINE", "LABSPEC", "PHURINE", "GLUCOSEU", "HGBUR", "BILIRUBINUR", "KETONESUR", "PROTEINUR", "UROBILINOGEN", "NITRITE", "LEUKOCYTESUR" in the last 72 hours.  Invalid input(s): "APPERANCEUR"     Imaging: PERIPHERAL VASCULAR CATHETERIZATION  Result Date:  12/08/2022 See surgical note for result.  DG Abd 2 Views  Result Date: 12/07/2022 CLINICAL DATA:  Abdominal pain. EXAM: ABDOMEN - 2 VIEW COMPARISON:  None. FINDINGS: Normal bowel gas pattern. No free air or bowel air-fluid levels. Right mid abdomen bowel anastomosis staples. Mild colonic stool burden. Previous cholecystectomy. Scattered calcifications that appear to be vascular. No convincing renal or ureteral stones. No acute skeletal abnormality. IMPRESSION: 1. No acute findings. No evidence of bowel obstruction. No free air. Electronically Signed   By: Amie Portland M.D.   On: 12/07/2022 16:10     Medications:    anticoagulant sodium citrate       allopurinol  100 mg Oral Daily   amLODipine  10 mg Oral Daily   anastrozole  1 mg Oral Daily   apixaban  2.5 mg Oral BID   carvedilol  12.5 mg Oral BID WC   Chlorhexidine Gluconate Cloth  6 each Topical Q0600   furosemide  40 mg Intravenous BID   isosorbide mononitrate  30 mg Oral Daily   levothyroxine  88 mcg Oral QAC breakfast   pantoprazole  40 mg Oral QHS   polyethylene glycol  17 g Oral Daily   sodium bicarbonate  1,300 mg Oral BID   acetaminophen, alteplase, anticoagulant sodium citrate, heparin, lidocaine (PF), lidocaine-prilocaine, oxyCODONE, pentafluoroprop-tetrafluoroeth, senna-docusate  Assessment/ Plan:  [name] is a 82 y.o.  female with breast cancer,  hypertension, hypothyroidism, GERD, atrial fibrillation, who was originally admitted to [facility] on 12/03/2022 for SOB (shortness of breath) [R06.02] Acute respiratory failure with hypoxia (HCC) [J96.01] Acute hypoxemic respiratory failure (HCC) [J96.01] Community acquired pneumonia of left upper lobe of lung [J18.9]  Chronic kidney disease stage V: Appears to be progression of disease  -Due for worsening creatinine and intermittent.  shortness of breath, we feel it is time to initiate renal replacement therapy.  Patient and family agreeable to proceed. - Appreciate  vascular surgery placing right chest PermCath on 12/08/2022. - Patient received initial dialysis treatment at that time. -Patient will receive second dialysis treatment today, will complete third treatment on Monday. Renal navigator aware of patient and will begin outpatient dialysis clinic search.  Hypertension with chronic kidney disease: continue carvedilol, isosorbide mononitrate and amlodipine.  Blood pressure slightly elevated, 153/74.  Will continue to monitor.  Anemia of chronic kidney disease: hemoglobin 10.3.  At goal.  Acute exacerbation of chronic congestive heart failure -Continue fluid management with furosemide and hemodialysis.    LOS: 6   6/8/202412:42 PM

## 2022-12-09 NOTE — Progress Notes (Signed)
  Patient Saturations on Room Air at Rest = 96%  Patient Saturations on [employer] while Ambulating = 91%

## 2022-12-09 NOTE — Progress Notes (Signed)
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  Will check a pulse ox on room air with ambulation.  Patient still undecided on dialysis overnight.  Creatinine worsened to 6.75 with a GFR of 6.  Repeat hemoglobin still low at 7.4. 6/6.  Patient had some abdominal pain today.  Abdominal x-ray read as negative.  Could be constipation related.  MiraLAX prescribed.  Will transfuse 1 unit of packed red blood cells on hemoglobin of 7.3.  Creatinine worsened to 7.13 and patient now agreeable to start dialysis.  PermCath will be placed tomorrow and dialysis will start tomorrow. 6/7.  No abdominal pain this morning.  Hemoglobin up to 8.6 with transfusion yesterday.  PermCath placement today.  Will start dialysis today. 6/8.  Hemoglobin up to 10.3.  Looks like came off oxygen this afternoon.  Second dialysis session today.  Assessment and Plan: * Acute kidney injury superimposed on CKD (HCC) Acute kidney injury on CKD stage V.  Creatinine as low as 5.95.  Today's creatinine down to 5.18 after dialysis yesterday.  PermCath placed 6/7.   second dialysis session today.  Acute respiratory failure with hypoxia (HCC) Likely secondary to multifocal pneumonia and fluid overload.  Looks like pulse ox on room air this afternoon.  Multifocal pneumonia Completed antibiotics  Acute on chronic diastolic CHF (congestive heart failure) (HCC) Patient has been on IV Lasix.  Patient will will be started on dialysis to help manage fluid status.  Patient on Coreg.  Paroxysmal atrial fibrillation (HCC) On  Coreg and Eliquis  HTN (hypertension) On amlodipine and Coreg  Type 2 diabetes mellitus with other specified complication (HCC) Last hemoglobin A1c 6.1.  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  Continue MiraLAX just in case constipation related.  Thrombocytopenia (HCC) Last platelet count 135.  Hepatitis B core antibody positive.  Will check hepatitis B viral load.  Recheck hepatitis C.  GERD (gastroesophageal reflux disease) On Protonix  Anemia due to stage 5 chronic kidney disease, 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) Responded to blood transfusion with hemoglobin up to 8.6.  After dialysis yesterday hemoglobin up to 10.3.  Malignant neoplasm of upper-outer quadrant of left breast in female, estrogen receptor positive (HCC) Anastrozole 1 mg daily resumed        Subjective: Patient did not remember going to dialysis yesterday evening.  Feels okay this morning.  No further abdominal pain.  Admitted with acute respiratory failure with pneumonia.  Physical Exam: Vitals:   12/08/22 2319 12/09/22 0446 12/09/22 0745 12/09/22 1206  BP: (!) 139/118 117/80 (!) 153/74 135/62  Pulse: 84 79 76 78  Resp: 20 18 16 18   Temp: 98.6 F (37 C) 98.9 F (37.2 C) 98 F (36.7 C) 97.9 F (36.6 C)  TempSrc:   Oral   SpO2: 99% 97% 91% 93%  Weight:      Height:       Physical Exam HENT:     Head: Normocephalic.     Mouth/Throat:     Pharynx: No oropharyngeal exudate.  Eyes:     General: Lids are normal.  Conjunctiva/sclera: Conjunctivae normal.  Cardiovascular:     Rate and Rhythm: Normal rate and regular rhythm.     Heart sounds: Normal heart sounds, S1 normal and S2 normal.  Pulmonary:     Breath sounds: Examination of the right-lower field reveals decreased breath sounds. Examination of the left-lower field reveals decreased breath sounds. Decreased breath sounds present. No wheezing, rhonchi or rales.  Abdominal:     Palpations: Abdomen is soft.      Tenderness: There is no abdominal tenderness.  Musculoskeletal:     Right lower leg: Swelling present.     Left lower leg: Swelling present.  Skin:    General: Skin is warm.     Findings: No rash.  Neurological:     Mental Status: She is alert and oriented to person, place, and time.     Data Reviewed: Creatinine 5.18, hemoglobin 10.3, platelet count 135  Family Communication: Updated patient's daughter on the phone  Disposition: Status is: Inpatient Remains inpatient appropriate because: Second dialysis session today.  Will need a rehab bed and outpatient dialysis slot.  Planned Discharge Destination: Rehab    Time spent: 28 minutes  Author: Alford Highland, MD 12/09/2022 2:48 PM  For on call review [email or web address].

## 2022-12-09 NOTE — Progress Notes (Signed)
Physical Therapy Treatment Patient Details Name: Alexis Tucker MRN: [id number] DOB: [birth year] Today's Date: 12/09/2022   History of Present Illness Patient is a 82 year old female presenting with concerns of labored breathing, short of breath, hypoxia. 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    PT Comments    Pt was asleep upon arrival however easily awakes. She is disoriented x 3 but cooperative and able to follow commands. Increased time to perform all mobility, transfers, and gait. Overall tolerated session but but was severely deconditioned. DC recs updated to reflect safety concerns. Discussed DC recs with pt's daughter. All parties are in agreement. Acute PT will continue to follow per current POC.    Recommendations for follow up therapy are one component of a multi-disciplinary discharge planning process, led by the attending physician.  Recommendations may be updated based on patient status, additional functional criteria and insurance authorization.     Assistance Recommended at Discharge Intermittent Supervision/Assistance  Patient can return home with the following A little help with walking and/or transfers;A little help with bathing/dressing/bathroom;Assistance with cooking/housework;Help with stairs or ramp for entrance;Assist for transportation   Equipment Recommendations   (Defer to next level of care)       Precautions / Restrictions Precautions Precautions: Fall Precaution Comments: vision impaired Restrictions Weight Bearing Restrictions: No     Mobility  Bed Mobility Overal bed mobility: Needs Assistance Bed Mobility: Supine to Sit, Sit to Supine  Supine to sit: Min assist Sit to supine: Min guard     Transfers Overall transfer level: Needs assistance Equipment used: Rolling walker (2 wheels) Transfers: Sit  to/from Stand Sit to Stand: Min assist    General transfer comment: Min assist for safety    Ambulation/Gait Ambulation/Gait assistance: Min assist Gait Distance (Feet): 12 Feet Assistive device: Rolling walker (2 wheels) Gait Pattern/deviations: Step-to pattern Gait velocity: decreased     General Gait Details: Pt was abl to ambulate ~ 12 ft prior to c/o severe fatigue and requesting seated rest. pt is far form her baseline. Discussed baseline abilities with pt's daughter. Pt and daughter agreeable to DC recs change    Balance Overall balance assessment: Needs assistance Sitting-balance support: Feet supported Sitting balance-Leahy Scale: Good     Standing balance support: Bilateral upper extremity supported, During functional activity Standing balance-Leahy Scale: Fair       Cognition Arousal/Alertness: Awake/alert Behavior During Therapy: Flat affect, WFL for tasks assessed/performed Overall Cognitive Status:  (Per daughter, Pt has had slightly chabnge in cognition over past few months)    General Comments: Pt was asleep upon arrival easily awakes but is disoriented x 2.               Pertinent Vitals/Pain Pain Assessment Pain Assessment: No/denies pain Breathing: normal    Home Living Family/patient expects to be discharged to:: Private residence Living Arrangements: Children Available Help at Discharge: Family Type of Home: House Home Access: Level entry     Alternate Level Stairs-Number of Steps: 13 Home Layout: Two level Home Equipment: Agricultural consultant (2 wheels);Cane - single point;Shower seat      Prior Function            PT Goals (current goals can now be found in the care plan section) Acute Rehab PT Goals Patient Stated Goal: none stated Progress towards PT goals: Progressing toward goals    Frequency  Min 3X/week      PT Plan Discharge plan needs to be updated       AM-PAC PT "6 Clicks" Mobility   Outcome Measure  Help  needed turning from your back to your side while in a flat bed without using bedrails?: None Help needed moving from lying on your back to sitting on the side of a flat bed without using bedrails?: A Little Help needed moving to and from a bed to a chair (including a wheelchair)?: A Little Help needed standing up from a chair using your arms (e.g., wheelchair or bedside chair)?: A Little Help needed to walk in hospital room?: A Lot Help needed climbing 3-5 steps with a railing? : Total 6 Click Score: 16    End of Session Equipment Utilized During Treatment: Oxygen Activity Tolerance: Patient limited by fatigue Patient left: in bed;with call bell/phone within reach;with bed alarm set Nurse Communication: Mobility status PT Visit Diagnosis: Muscle weakness (generalized) (M62.81);Unsteadiness on feet (R26.81)     Time: 6045-4098 PT Time Calculation (min) (ACUTE ONLY): 15 min  Charges:  $Therapeutic Activity: 8-22 mins                     Jetta Lout PTA 12/09/22, 12:48 PM

## 2022-12-09 NOTE — Progress Notes (Signed)
  Received patient in bed to unit.   Informed consent signed and in chart.    TX duration:2.5hrs     Transported back to floor  Hand-off given to patient's nurse. No c/o and no distress noted    Access used: R HD Catheter Access issues: none   Total UF removed: 0L Medication(s) given: none Post HD VS: 142/72 Post HD weight: 76.7kg     Lynann Beaver  [facility]

## 2022-12-10 DIAGNOSIS — D638 Anemia in other chronic diseases classified elsewhere: Secondary | ICD-10-CM

## 2022-12-10 DIAGNOSIS — N186 End stage renal disease: Secondary | ICD-10-CM | POA: Diagnosis not present

## 2022-12-10 DIAGNOSIS — J189 Pneumonia, unspecified organism: Secondary | ICD-10-CM | POA: Diagnosis not present

## 2022-12-10 DIAGNOSIS — J9601 Acute respiratory failure with hypoxia: Secondary | ICD-10-CM | POA: Diagnosis not present

## 2022-12-10 DIAGNOSIS — I5033 Acute on chronic diastolic (congestive) heart failure: Secondary | ICD-10-CM | POA: Diagnosis not present

## 2022-12-10 LAB — HEMOGLOBIN: Hemoglobin: 9.3 g/dL — ABNORMAL LOW (ref 12.0–15.0)

## 2022-12-10 LAB — HEPATITIS C ANTIBODY: HCV Ab: NONREACTIVE

## 2022-12-10 MED ORDER — FUROSEMIDE 40 MG PO TABS
40.0000 mg | ORAL_TABLET | ORAL | Status: DC
  Administered 2022-12-12 – 2022-12-14 (×2): 40 mg via ORAL
  Filled 2022-12-10 (×3): qty 1

## 2022-12-10 NOTE — Progress Notes (Signed)
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  Will check a pulse ox on room air with ambulation.  Patient still undecided on dialysis overnight.  Creatinine worsened to 6.75 with a GFR of 6.  Repeat hemoglobin still low at 7.4. 6/6.  Patient had some abdominal pain today.  Abdominal x-ray read as negative.  Could be constipation related.  MiraLAX prescribed.  Will transfuse 1 unit of packed red blood cells on hemoglobin of 7.3.  Creatinine worsened to 7.13 and patient now agreeable to start dialysis.  PermCath will be placed tomorrow and dialysis will start tomorrow. 6/7.  No abdominal pain this morning.  Hemoglobin up to 8.6 with transfusion yesterday.  PermCath placement today.  Will start dialysis today. 6/8.  Hemoglobin up to 10.3.  Looks like came off oxygen this afternoon.  Second dialysis session today. 6/9.  Hemoglobin 9.3.  Off oxygen this morning.  Change Lasix to 40 mg on nondialysis days.  Assessment and Plan: * ESRD (end stage renal disease) (HCC) Patient had second dialysis session yesterday.  Had PermCath placed on 6/7.  Acute respiratory failure with hypoxia (HCC) Likely secondary to multifocal pneumonia and fluid overload.  Patient on room air this morning  Acute on chronic diastolic CHF (congestive heart failure) (HCC) Change Lasix to 40 mg daily on nondialysis days.  Patient on Coreg.  Multifocal pneumonia Completed antibiotics  Paroxysmal atrial fibrillation (HCC) On Coreg and Eliquis  HTN (hypertension) On amlodipine and Coreg  Type 2 diabetes  mellitus with other specified complication (HCC) Last hemoglobin A1c 6.1.  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  Continue MiraLAX just in case constipation related.  Thrombocytopenia (HCC) Last platelet count 135.  Hepatitis B core antibody positive.  Will check hepatitis B viral load.  Hepatitis C negative.  GERD (gastroesophageal reflux disease) On Protonix  Anemia of chronic disease Received 1 unit of packed red blood cells during the hospital course.  Last hemoglobin 9.3.  Malignant neoplasm of upper-outer quadrant of left breast in female, estrogen receptor positive (HCC) Anastrozole 1 mg daily resumed        Subjective: Patient seen this morning and was feeling okay.  Offers no complaints.  Physical Exam: Vitals:   12/10/22 0034 12/10/22 0453 12/10/22 0723 12/10/22 1222  BP: (!) 142/69 (!) 142/61 (!) 145/84 121/63  Pulse: 86 90 87 82  Resp: 17 17 18 18   Temp: 98.7 F (37.1 C) 98.8 F (37.1 C) 98.8 F (37.1 C) 98.2 F (36.8 C)  TempSrc: Oral Oral    SpO2: 96% 96% 95% 96%  Weight:      Height:       Physical Exam HENT:     Head: Normocephalic.     Mouth/Throat:     Pharynx: No oropharyngeal exudate.  Eyes:     General: Lids are normal.     Conjunctiva/sclera: Conjunctivae normal.  Cardiovascular:     Rate and Rhythm: Normal rate and regular rhythm.     Heart sounds: Normal heart sounds, S1 normal and S2 normal.  Pulmonary:     Breath sounds: Examination  of the right-lower field reveals decreased breath sounds. Examination of the left-lower field reveals decreased breath sounds. Decreased breath sounds present. No wheezing, rhonchi or rales.  Abdominal:     Palpations: Abdomen is soft.     Tenderness: There is no abdominal tenderness.  Musculoskeletal:     Right lower leg: Swelling present.     Left lower leg: Swelling present.  Skin:    General: Skin is warm.     Findings: No rash.  Neurological:     Mental Status:  She is alert and oriented to person, place, and time.     Data Reviewed: Hemoglobin 9.3.  Hepatitis C negative  Family Communication: Updated patient's daughter on the phone  Disposition: Status is: Inpatient Remains inpatient appropriate because: Will need outpatient dialysis slot and a rehab bed prior to disposition.  Third dialysis session tomorrow  Planned Discharge Destination: Rehab    Time spent: 28 minutes Case discussed with nephrology Author: Alford Highland, MD 12/10/2022 1:06 PM  For on call review [email or web address].

## 2022-12-10 NOTE — TOC Progression Note (Signed)
Transition of Care [facility]) - Progression Note    Patient Details  Name: Alexis Tucker MRN: [id number] Date of Birth: 02-24-1941  [facility]) CM/SW Contact  Kemper Durie, RN Phone Number: 12/10/2022, 4:36 PM  Clinical Narrative:     New recommendations for patient to go to SNF.  Spoke with patient and daughter, both agree, no preference.  Bed search initiated.      Barriers to Discharge: Continued Medical Work up  Expected Discharge Plan and Services       Living arrangements for the past 2 months: Single Family Home                           HH Arranged: PT HH Agency: [facility] Date HH Agency Contacted: 12/07/22 Time HH Agency Contacted: 1257 Representative spoke with at [facility] Agency: Barbara Cower   Social Determinants of Health (SDOH) Interventions SDOH Screenings   Food Insecurity: No Food Insecurity (12/05/2022)  Housing: Low Risk  (12/05/2022)  Transportation Needs: No Transportation Needs (12/05/2022)  Utilities: Not At Risk (12/05/2022)  Depression (PHQ2-9): Low Risk  (09/25/2022)  Financial Resource Strain: Low Risk  (12/28/2021)  Physical Activity: Inactive (12/28/2021)  Stress: No Stress Concern Present (12/28/2021)  Tobacco Use: Medium Risk (12/03/2022)    Readmission Risk Interventions    12/07/2022   12:55 PM  Readmission Risk Prevention Plan  Transportation Screening Complete  PCP or Specialist Appt within 3-5 Days Complete  HRI or Home Care Consult Complete  Social Work Consult for Recovery Care Planning/Counseling Complete  Palliative Care Screening Not Applicable  Medication Review Oceanographer) Complete

## 2022-12-10 NOTE — Assessment & Plan Note (Signed)
Third dialysis session today.  Had PermCath placed on 6/7.  Stable to go out to rehab once we have a bed and a outpatient dialysis slot.

## 2022-12-10 NOTE — Assessment & Plan Note (Signed)
Received 1 unit of packed red blood cells during the hospital course.  Last hemoglobin 9.3.

## 2022-12-10 NOTE — Progress Notes (Signed)
[facility]  ROUNDING NOTE   Subjective:   [name] was admitted to [facility] on 12/03/2022 for SOB (shortness of breath) [R06.02] Acute respiratory failure with hypoxia (HCC) [J96.01] Acute hypoxemic respiratory failure (HCC) [J96.01] Community acquired pneumonia of left upper lobe of lung [J18.9]  Patient sitting up in bed Alert, recognized Dr Thedore Mins on arrival Competed breakfast tray at bedside Spoke with patient's daughter, Alexis Tucker, on speaker phone Update provided    Objective:  Vital signs in last 24 hours:  Temp:  [97.9 F (36.6 C)-98.8 F (37.1 C)] 98.8 F (37.1 C) (06/09 0723) Pulse Rate:  [78-90] 87 (06/09 0723) Resp:  [11-22] 18 (06/09 0723) BP: (103-152)/(55-90) 145/84 (06/09 0723) SpO2:  [93 %-99 %] 95 % (06/09 0723) Weight:  [76.7 kg] 76.7 kg (06/08 1743)  Weight change: -2.9 kg Filed Weights   12/09/22 1445 12/09/22 1730 12/09/22 1743  Weight: 76.7 kg 76.7 kg 76.7 kg    Intake/Output: I/O last 3 completed shifts: In: 1120 [P.O.:1120] Out: 1300 [Urine:1300]   Intake/Output this shift:  No intake/output data recorded.  Physical Exam: General: NAD, laying in bed  Head: Normocephalic, atraumatic. Moist oral mucosal membranes  Eyes: Anicteric  Lungs:  Clear to auscultation  Heart: Regular rate and rhythm  Abdomen:  Soft, nontender  Extremities:  trace peripheral edema.  Neurologic: Alert and oriented to self, moving all four extremities  Skin: No lesions  Access: AVG - no bruit or thrill, right chest PermCath placed on 12/08/2022    Basic Metabolic Panel: Recent Labs  Lab 12/05/22 0629 12/06/22 1135 12/07/22 0947 12/08/22 0524 12/09/22 0529  NA 141 140 140 144 140  K 4.3 3.6 3.9 3.5 3.6  CL 107 107 107 109 102  CO2 21* 23 18* 24 25  GLUCOSE 115* 172* 185* 119* 106*  BUN 58* 63* 64* 63* 42*  CREATININE 6.22* 6.75* 7.13* 6.97* 5.18*  CALCIUM 8.9 8.4* 8.8* 8.6* 8.6*  PHOS 4.8*  --   --   --   --      Liver Function  Tests: Recent Labs  Lab 12/03/22 1545 12/05/22 0629 12/08/22 0524  AST 16  --  14*  ALT 10  --  10  ALKPHOS 51  --  55  BILITOT 1.2  --  0.6  PROT 7.1  --  6.6  ALBUMIN 3.8 3.2* 3.2*    No results for input(s): "LIPASE", "AMYLASE" in the last 168 hours. No results for input(s): "AMMONIA" in the last 168 hours.  CBC: Recent Labs  Lab 12/03/22 1204 12/04/22 0506 12/05/22 0629 12/06/22 1135 12/07/22 0947 12/08/22 0524 12/09/22 0529 12/10/22 0510  WBC 8.1 10.6* 8.5  --   --  4.4 5.5  --   NEUTROABS  --   --  7.0  --   --   --   --   --   HGB 9.2* 8.5* 7.3* 7.4* 7.3* 8.6* 10.3* 9.3*  HCT 29.1* 27.0* 23.1*  --   --  26.9* 32.3*  --   MCV 95.4 96.4 94.3  --   --  93.1 93.4  --   PLT 100* 101* 86*  --   --  136* 135*  --      Cardiac Enzymes: No results for input(s): "CKTOTAL", "CKMB", "CKMBINDEX", "TROPONINI" in the last 168 hours.  BNP: Invalid input(s): "POCBNP"  CBG: Recent Labs  Lab 12/08/22 0857 12/08/22 1018  GLUCAP 91 104*     Microbiology: Results for orders placed or performed  during the hospital encounter of 12/03/22  Resp panel by RT-PCR (RSV, Flu A&B, Covid) Anterior Nasal Swab     Status: None   Collection Time: 12/03/22  3:45 PM   Specimen: Anterior Nasal Swab  Result Value Ref Range Status   SARS Coronavirus 2 by RT PCR NEGATIVE NEGATIVE Final    Comment: (NOTE) SARS-CoV-2 target nucleic acids are NOT DETECTED.  The SARS-CoV-2 RNA is generally detectable in upper respiratory specimens during the acute phase of infection. The lowest concentration of SARS-CoV-2 viral copies this assay can detect is 138 copies/mL. A negative result does not preclude SARS-Cov-2 infection and should not be used as the sole basis for treatment or other patient management decisions. A negative result may occur with  improper specimen collection/handling, submission of specimen other than nasopharyngeal swab, presence of viral mutation(s) within the areas targeted  by this assay, and inadequate number of viral copies(<138 copies/mL). A negative result must be combined with clinical observations, patient history, and epidemiological information. The expected result is Negative.  Fact Sheet for Patients:  BloggerCourse.com  Fact Sheet for Healthcare Providers:  SeriousBroker.it  This test is no t yet approved or cleared by the [place] FDA and  has been authorized for detection and/or diagnosis of SARS-CoV-2 by FDA under an Emergency Use Authorization (EUA). This EUA will remain  in effect (meaning this test can be used) for the duration of the COVID-19 declaration under Section 564(b)(1) of the Act, 21 U.S.C.section 360bbb-3(b)(1), unless the authorization is terminated  or revoked sooner.       Influenza A by PCR NEGATIVE NEGATIVE Final   Influenza B by PCR NEGATIVE NEGATIVE Final    Comment: (NOTE) The Xpert Xpress SARS-CoV-2/FLU/RSV plus assay is intended as an aid in the diagnosis of influenza from Nasopharyngeal swab specimens and should not be used as a sole basis for treatment. Nasal washings and aspirates are unacceptable for Xpert Xpress SARS-CoV-2/FLU/RSV testing.  Fact Sheet for Patients: BloggerCourse.com  Fact Sheet for Healthcare Providers: SeriousBroker.it  This test is not yet approved or cleared by the [place] FDA and has been authorized for detection and/or diagnosis of SARS-CoV-2 by FDA under an Emergency Use Authorization (EUA). This EUA will remain in effect (meaning this test can be used) for the duration of the COVID-19 declaration under Section 564(b)(1) of the Act, 21 U.S.C. section 360bbb-3(b)(1), unless the authorization is terminated or revoked.     Resp Syncytial Virus by PCR NEGATIVE NEGATIVE Final    Comment: (NOTE) Fact Sheet for Patients: BloggerCourse.com  Fact  Sheet for Healthcare Providers: SeriousBroker.it  This test is not yet approved or cleared by the [place] FDA and has been authorized for detection and/or diagnosis of SARS-CoV-2 by FDA under an Emergency Use Authorization (EUA). This EUA will remain in effect (meaning this test can be used) for the duration of the COVID-19 declaration under Section 564(b)(1) of the Act, 21 U.S.C. section 360bbb-3(b)(1), unless the authorization is terminated or revoked.  Performed at [facility], [address] [place], [place] [address]   Blood Culture (routine x 2)     Status: None   Collection Time: 12/03/22  3:45 PM   Specimen: BLOOD  Result Value Ref Range Status   Specimen Description BLOOD BLOOD LEFT ARM  Final   Special Requests   Final    BOTTLES DRAWN AEROBIC AND ANAEROBIC Blood Culture adequate volume   Culture   Final    NO GROWTH 5 DAYS Performed at  [facility], [address], [place], [place] [address]    Report Status 12/08/2022 FINAL  Final  Blood Culture (routine x 2)     Status: None   Collection Time: 12/03/22  4:30 PM   Specimen: BLOOD  Result Value Ref Range Status   Specimen Description BLOOD BLOOD LEFT ARM  Final   Special Requests   Final    BOTTLES DRAWN AEROBIC AND ANAEROBIC Blood Culture adequate volume   Culture   Final    NO GROWTH 5 DAYS Performed at [facility], [address] [place], [place] [address]    Report Status 12/08/2022 FINAL  Final    Coagulation Studies: No results for input(s): "LABPROT", "INR" in the last 72 hours.   Urinalysis: No results for input(s): "COLORURINE", "LABSPEC", "PHURINE", "GLUCOSEU", "HGBUR", "BILIRUBINUR", "KETONESUR", "PROTEINUR", "UROBILINOGEN", "NITRITE", "LEUKOCYTESUR" in the last 72 hours.  Invalid input(s): "APPERANCEUR"     Imaging: PERIPHERAL VASCULAR CATHETERIZATION  Result Date: 12/08/2022 See surgical note for result.    Medications:     anticoagulant sodium citrate       allopurinol  100 mg Oral Daily   amLODipine  10 mg Oral Daily   anastrozole  1 mg Oral Daily   apixaban  2.5 mg Oral BID   carvedilol  12.5 mg Oral BID WC   Chlorhexidine Gluconate Cloth  6 each Topical Q0600   [START ON 12/12/2022] furosemide  40 mg Oral Once per day on Sun Tue Thu Sat   isosorbide mononitrate  30 mg Oral Daily   levothyroxine  88 mcg Oral QAC breakfast   pantoprazole  40 mg Oral QHS   polyethylene glycol  17 g Oral Daily   sodium bicarbonate  1,300 mg Oral BID   acetaminophen, alteplase, anticoagulant sodium citrate, heparin, lidocaine (PF), lidocaine-prilocaine, oxyCODONE, pentafluoroprop-tetrafluoroeth, senna-docusate  Assessment/ Plan:  [name] is a 82 y.o.  female with breast cancer,  hypertension, hypothyroidism, GERD, atrial fibrillation, who was originally admitted to [facility] on 12/03/2022 for SOB (shortness of breath) [R06.02] Acute respiratory failure with hypoxia (HCC) [J96.01] Acute hypoxemic respiratory failure (HCC) [J96.01] Community acquired pneumonia of left upper lobe of lung [J18.9]  End stage renal disease requiring hemodialysis -Due for worsening creatinine and intermittent.  shortness of breath, we feel this is a progression of disease and time to initiate renal replacement therapy.  Patient and family agreeable to proceed. - Appreciate vascular surgery placing right chest PermCath on 12/08/2022. - Second dialysis treatment received on Saturday. Tolerated it well.  - Will plan for third treatment on Monday, preferably with patient seated in chair.  - Will transition furosemide to non dialysis days. Renal navigator aware of patient and will begin outpatient dialysis clinic search.  Hypertension with chronic kidney disease: continue carvedilol, isosorbide mononitrate and amlodipine.  Blood pressure acceptable for this patient   Anemia of chronic kidney disease: hemoglobin 9.3, will monitor for  need of ESA  Acute exacerbation of chronic congestive heart failure -Continue fluid management with furosemide and hemodialysis.    LOS: 7   6/9/202411:44 AM

## 2022-12-10 NOTE — NC FL2 (Signed)
[place] MEDICAID FL2 LEVEL OF CARE FORM     IDENTIFICATION  Patient Name: Alexis Tucker Birthdate: September 18, 1940 Sex: female Admission Date (Current Location): 12/03/2022  [facility] and [place] Number:  Chiropodist and Address:  [facility], [address] [place], [place] [address]      Provider Number: [id number]  Attending Physician Name and Address:  Alford Highland, MD  Relative Name and Phone Number:  Etheleen Mayhew (Granddaughter) [phone] (Mobile)    Current Level of Care: Hospital Recommended Level of Care: Skilled Nursing Facility Prior Approval Number:    Date Approved/Denied:   PASRR Number: [id number]  Discharge Plan: Home    Current Diagnoses: Patient Active Problem List   Diagnosis Date Noted   ESRD (end stage renal disease) (HCC) 12/10/2022   Anemia of chronic disease 12/10/2022   Acute on chronic diastolic CHF (congestive heart failure) (HCC) 12/08/2022   Abdominal pain 12/07/2022   Multifocal pneumonia 12/06/2022   DNR (do not resuscitate) 12/03/2022   Glenohumeral arthritis, left 10/14/2022   Rotator cuff disorder, left 10/14/2022   Left arm weakness 10/13/2022   Atypical chest pain 10/13/2022   Memory impairment 10/09/2022   Bilateral hearing loss due to cerumen impaction 09/25/2022   Pruritus 09/25/2022   Decreased hearing of both ears 09/25/2022   Acute respiratory failure with hypoxia (HCC) 04/22/2022   Iron deficiency 02/20/2022   Arteriovenous graft for hemodialysis in place, primary 02/09/2022   General weakness 11/26/2021   Unintentional weight loss 09/19/2021   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   Primary open angle glaucoma (POAG) of both eyes 06/19/2020   Malignant neoplasm of upper-outer quadrant of left breast in female, estrogen receptor positive (HCC) 12/03/2017   Osteoporosis 09/29/2017   History of arterial ischemic stroke 08/27/2017   Thrombocytopenia  (HCC) 08/27/2017   Paroxysmal atrial fibrillation (HCC)    Elevated troponin 05/04/2016   Assistance needed with transportation 07/31/2015   Advanced care planning/counseling discussion 07/27/2015   Lower back pain 03/14/2014   Macular degeneration    Diabetic retinopathy with macular edema (HCC) 03/03/2014   Medicare annual wellness visit, subsequent 01/07/2013   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) 01/06/2013   Carotid stenosis, asymptomatic 12/05/2012   Post-surgical hypothyroidism    OA (osteoarthritis)    Type 2 diabetes mellitus with other specified complication (HCC)    HTN (hypertension)    History of breast cancer    History of colon cancer    GERD (gastroesophageal reflux disease)     Orientation RESPIRATION BLADDER Height & Weight     Self, Time, Situation, Place  Normal Incontinent Weight: 76.7 kg Height:  5\' 3"  (160 cm)  BEHAVIORAL SYMPTOMS/MOOD NEUROLOGICAL BOWEL NUTRITION STATUS      Continent Diet  AMBULATORY STATUS COMMUNICATION OF NEEDS Skin   Extensive Assist Verbally Other (Comment) (Dialysis catheter)                       Personal Care Assistance Level of Assistance  Dressing, Bathing Bathing Assistance: Limited assistance   Dressing Assistance: Limited assistance     Functional Limitations Info             SPECIAL CARE FACTORS FREQUENCY  PT (By licensed PT), OT (By licensed OT)     PT Frequency: 5 times a week OT Frequency: 5 times a week            Contractures Contractures Info: Not present  Additional Factors Info  Code Status, Allergies (HD 3 days a week) Code Status Info: DNR Allergies Info: Tramadol Not Specified Intolerance Other (See Comments) nightmares Codeine Low  Anxiety  Sulfa Antibiotics           Current Medications (12/10/2022):  This is the current hospital active medication list Current Facility-Administered Medications  Medication Dose Route Frequency Provider Last Rate Last Admin    acetaminophen (TYLENOL) tablet 650 mg  650 mg Oral Q6H PRN Alford Highland, MD       allopurinol (ZYLOPRIM) tablet 100 mg  100 mg Oral Daily Cox, Amy N, DO   100 mg at 12/10/22 0826   alteplase (CATHFLO ACTIVASE) injection 2 mg  2 mg Intracatheter Once PRN Wendee Beavers, NP       amLODipine (NORVASC) tablet 10 mg  10 mg Oral Daily Cox, Amy N, DO   10 mg at 12/10/22 1610   anastrozole (ARIMIDEX) tablet 1 mg  1 mg Oral Daily Cox, Amy N, DO   1 mg at 12/10/22 9604   anticoagulant sodium citrate solution 5 mL  5 mL Intracatheter PRN Wendee Beavers, NP       apixaban (ELIQUIS) tablet 2.5 mg  2.5 mg Oral BID Enedina Finner, MD   2.5 mg at 12/10/22 0826   carvedilol (COREG) tablet 12.5 mg  12.5 mg Oral BID WC Cox, Amy N, DO   12.5 mg at 12/10/22 5409   Chlorhexidine Gluconate Cloth 2 % PADS 6 each  6 each Topical Q0600 Wendee Beavers, NP   6 each at 12/10/22 0527   [START ON 12/12/2022] furosemide (LASIX) tablet 40 mg  40 mg Oral Once per day on Sun Tue Thu Sat Drusilla Kanner, RPH       heparin injection 1,000 Units  1,000 Units Intracatheter PRN Wendee Beavers, NP       isosorbide mononitrate (IMDUR) 24 hr tablet 30 mg  30 mg Oral Daily Cox, Amy N, DO   30 mg at 12/10/22 8119   levothyroxine (SYNTHROID) tablet 88 mcg  88 mcg Oral QAC breakfast Cox, Amy N, DO   88 mcg at 12/10/22 0527   lidocaine (PF) (XYLOCAINE) 1 % injection 5 mL  5 mL Intradermal PRN Wendee Beavers, NP       lidocaine-prilocaine (EMLA) cream 1 Application  1 Application Topical PRN Wendee Beavers, NP       oxyCODONE (Oxy IR/ROXICODONE) immediate release tablet 5 mg  5 mg Oral Q6H PRN Wieting, Richard, MD       pantoprazole (PROTONIX) EC tablet 40 mg  40 mg Oral QHS Cox, Amy N, DO   40 mg at 12/09/22 2152   pentafluoroprop-tetrafluoroeth (GEBAUERS) aerosol 1 Application  1 Application Topical PRN Wendee Beavers, NP       polyethylene glycol (MIRALAX / GLYCOLAX) packet 17 g  17 g Oral Daily Alford Highland, MD   17 g  at 12/09/22 0843   senna-docusate (Senokot-S) tablet 1 tablet  1 tablet Oral QHS PRN Cox, Amy N, DO       sodium bicarbonate tablet 1,300 mg  1,300 mg Oral BID Cox, Amy N, DO   1,300 mg at 12/10/22 1478     Discharge Medications: Please see discharge summary for a list of discharge medications.  Relevant Imaging Results:  Relevant Lab Results:   Additional Information SSN - [id number], HD 3 days a week  Kemper Durie, [place]

## 2022-12-11 ENCOUNTER — Encounter: Payer: Self-pay | Admitting: Vascular Surgery

## 2022-12-11 DIAGNOSIS — N186 End stage renal disease: Secondary | ICD-10-CM | POA: Diagnosis not present

## 2022-12-11 DIAGNOSIS — J189 Pneumonia, unspecified organism: Secondary | ICD-10-CM | POA: Diagnosis not present

## 2022-12-11 DIAGNOSIS — I5033 Acute on chronic diastolic (congestive) heart failure: Secondary | ICD-10-CM | POA: Diagnosis not present

## 2022-12-11 DIAGNOSIS — J9601 Acute respiratory failure with hypoxia: Secondary | ICD-10-CM | POA: Diagnosis not present

## 2022-12-11 LAB — BASIC METABOLIC PANEL
Anion gap: 10 (ref 5–15)
BUN: 28 mg/dL — ABNORMAL HIGH (ref 8–23)
CO2: 27 mmol/L (ref 22–32)
Calcium: 8.6 mg/dL — ABNORMAL LOW (ref 8.9–10.3)
Chloride: 103 mmol/L (ref 98–111)
Creatinine, Ser: 4.66 mg/dL — ABNORMAL HIGH (ref 0.44–1.00)
GFR, Estimated: 9 mL/min — ABNORMAL LOW (ref >60)
Glucose, Bld: 111 mg/dL — ABNORMAL HIGH (ref 70–99)
Potassium: 3.4 mmol/L — ABNORMAL LOW (ref 3.5–5.1)
Sodium: 140 mmol/L (ref 135–145)

## 2022-12-11 LAB — HEPATITIS B DNA, ULTRAQUANTITATIVE, PCR
HBV DNA SERPL PCR-ACNC: <10 IU/mL
HBV DNA SERPL PCR-LOG IU: UNDETERMINED log10 IU/mL

## 2022-12-11 LAB — HEMOGLOBIN: Hemoglobin: 9.9 g/dL — ABNORMAL LOW (ref 12.0–15.0)

## 2022-12-11 MED ORDER — HEPARIN SODIUM (PORCINE) 1000 UNIT/ML IJ SOLN
INTRAMUSCULAR | Status: AC
  Filled 2022-12-11: qty 10

## 2022-12-11 NOTE — TOC Progression Note (Signed)
Transition of Care [facility]) - Progression Note    Patient Details  Name: Alexis Tucker MRN: [id number] Date of Birth: [birth year]  [facility]) CM/SW Contact  Truddie Hidden, RN Phone Number: 12/11/2022, 3:45 PM  Clinical Narrative:    Attempt to give bed offers for [facility], [place], [facility], and [facility]. Patient not in room.     Barriers to Discharge: Continued Medical Work up  Expected Discharge Plan and Services       Living arrangements for the past 2 months: Single Family Home                           HH Arranged: PT [facility] Date HH Agency Contacted: 12/07/22 Time HH Agency Contacted: 1257 Representative spoke with at [facility] Agency: Barbara Cower   Social Determinants of Health (SDOH) Interventions SDOH Screenings   Food Insecurity: No Food Insecurity (12/05/2022)  Housing: Low Risk  (12/05/2022)  Transportation Needs: No Transportation Needs (12/05/2022)  Utilities: Not At Risk (12/05/2022)  Depression (PHQ2-9): Low Risk  (09/25/2022)  Financial Resource Strain: Low Risk  (12/28/2021)  Physical Activity: Inactive (12/28/2021)  Stress: No Stress Concern Present (12/28/2021)  Tobacco Use: Medium Risk (12/11/2022)    Readmission Risk Interventions    12/07/2022   12:55 PM  Readmission Risk Prevention Plan  Transportation Screening Complete  PCP or Specialist Appt within 3-5 Days Complete  HRI or Home Care Consult Complete  Social Work Consult for Recovery Care Planning/Counseling Complete  Palliative Care Screening Not Applicable  Medication Review Oceanographer) Complete

## 2022-12-11 NOTE — Progress Notes (Signed)
Received patient in bed to unit.    Informed consent signed and in chart.    TX duration:3hr 15 min     Transported By hospital transport back to room Hand-off given to patient's nurse.    Access used: R chest PC Access issues: None   Total UF removed: Medication(s) given:none Post HD VS: Stable Post HD weight: 76    [facility]

## 2022-12-11 NOTE — Care Management Important Message (Signed)
Important Message  Patient Details  Name: Alexis Tucker MRN: [id number] Date of Birth: 04-23-1941   Medicare Important Message Given:  Yes     [name] 12/11/2022, 12:06 PM

## 2022-12-11 NOTE — Progress Notes (Signed)
PT Cancellation Note  Patient Details Name: Alexis Tucker MRN: [id number] DOB: April 12, 1941   Cancelled Treatment:    Reason Eval/Treat Not Completed: Patient at procedure or test/unavailable.  Chart reviewed and attempted to see pt.  Pt currently out of the room for dialysis. PT to follow up when pt is next available for PT intervention.     Nolon Bussing, PT, DPT Physical Therapist - [facility]  12/11/22, 3:08 PM

## 2022-12-11 NOTE — Progress Notes (Signed)
[facility]  ROUNDING NOTE   Subjective:   Alexis Tucker was admitted to [facility] on 12/03/2022 for SOB (shortness of breath) [R06.02] Acute respiratory failure with hypoxia (HCC) [J96.01] Acute hypoxemic respiratory failure (HCC) [J96.01] Community acquired pneumonia of left upper lobe of lung [J18.9]  Patient laying in bed Completed breakfast tray at bedside Denies pain or discomfort Room air No lower extremity edema    Objective:  Vital signs in last 24 hours:  Temp:  [97.5 F (36.4 C)-98.8 F (37.1 C)] 98.8 F (37.1 C) (06/10 1228) Pulse Rate:  [71-86] 78 (06/10 1330) Resp:  [15-20] 17 (06/10 1330) BP: (106-158)/(63-78) 134/69 (06/10 1505) SpO2:  [92 %-98 %] 98 % (06/10 1330) Weight:  [76 kg] 76 kg (06/10 1247)  Weight change:  Filed Weights   12/09/22 1730 12/09/22 1743 12/11/22 1247  Weight: 76.7 kg 76.7 kg 76 kg    Intake/Output: I/O last 3 completed shifts: In: 780 [P.O.:780] Out: 1100 [Urine:1100]   Intake/Output this shift:  Total I/O In: 240 [P.O.:240] Out: 400 [Urine:400]  Physical Exam: General: NAD, laying in bed  Head: Normocephalic, atraumatic. Moist oral mucosal membranes  Eyes: Anicteric  Lungs:  Clear to auscultation  Heart: Regular rate and rhythm  Abdomen:  Soft, nontender  Extremities:  trace peripheral edema.  Neurologic: Alert and oriented to self, moving all four extremities  Skin: No lesions  Access: AVG - no bruit or thrill, right chest PermCath placed on 12/08/2022    Basic Metabolic Panel: Recent Labs  Lab 12/05/22 0629 12/06/22 1135 12/07/22 0947 12/08/22 0524 12/09/22 0529 12/11/22 0510  NA 141 140 140 144 140 140  K 4.3 3.6 3.9 3.5 3.6 3.4*  CL 107 107 107 109 102 103  CO2 21* 23 18* 24 25 27   GLUCOSE 115* 172* 185* 119* 106* 111*  BUN 58* 63* 64* 63* 42* 28*  CREATININE 6.22* 6.75* 7.13* 6.97* 5.18* 4.66*  CALCIUM 8.9 8.4* 8.8* 8.6* 8.6* 8.6*  PHOS 4.8*  --   --   --   --   --      Liver  Function Tests: Recent Labs  Lab 12/05/22 0629 12/08/22 0524  AST  --  14*  ALT  --  10  ALKPHOS  --  55  BILITOT  --  0.6  PROT  --  6.6  ALBUMIN 3.2* 3.2*    No results for input(s): "LIPASE", "AMYLASE" in the last 168 hours. No results for input(s): "AMMONIA" in the last 168 hours.  CBC: Recent Labs  Lab 12/05/22 0629 12/06/22 1135 12/07/22 0947 12/08/22 0524 12/09/22 0529 12/10/22 0510 12/11/22 0510  WBC 8.5  --   --  4.4 5.5  --   --   NEUTROABS 7.0  --   --   --   --   --   --   HGB 7.3*   < > 7.3* 8.6* 10.3* 9.3* 9.9*  HCT 23.1*  --   --  26.9* 32.3*  --   --   MCV 94.3  --   --  93.1 93.4  --   --   PLT 86*  --   --  136* 135*  --   --    < > = values in this interval not displayed.     Cardiac Enzymes: No results for input(s): "CKTOTAL", "CKMB", "CKMBINDEX", "TROPONINI" in the last 168 hours.  BNP: Invalid input(s): "POCBNP"  CBG: Recent Labs  Lab 12/08/22 0857 12/08/22 1018  GLUCAP  91 104*     Microbiology: Results for orders placed or performed during the hospital encounter of 12/03/22  Resp panel by RT-PCR (RSV, Flu A&B, Covid) Anterior Nasal Swab     Status: None   Collection Time: 12/03/22  3:45 PM   Specimen: Anterior Nasal Swab  Result Value Ref Range Status   SARS Coronavirus 2 by RT PCR NEGATIVE NEGATIVE Final    Comment: (NOTE) SARS-CoV-2 target nucleic acids are NOT DETECTED.  The SARS-CoV-2 RNA is generally detectable in upper respiratory specimens during the acute phase of infection. The lowest concentration of SARS-CoV-2 viral copies this assay can detect is 138 copies/mL. A negative result does not preclude SARS-Cov-2 infection and should not be used as the sole basis for treatment or other patient management decisions. A negative result may occur with  improper specimen collection/handling, submission of specimen other than nasopharyngeal swab, presence of viral mutation(s) within the areas targeted by this assay, and  inadequate number of viral copies(<138 copies/mL). A negative result must be combined with clinical observations, patient history, and epidemiological information. The expected result is Negative.  Fact Sheet for Patients:  BloggerCourse.com  Fact Sheet for Healthcare Providers:  SeriousBroker.it  This test is no t yet approved or cleared by the [place] FDA and  has been authorized for detection and/or diagnosis of SARS-CoV-2 by FDA under an Emergency Use Authorization (EUA). This EUA will remain  in effect (meaning this test can be used) for the duration of the COVID-19 declaration under Section 564(b)(1) of the Act, 21 U.S.C.section 360bbb-3(b)(1), unless the authorization is terminated  or revoked sooner.       Influenza A by PCR NEGATIVE NEGATIVE Final   Influenza B by PCR NEGATIVE NEGATIVE Final    Comment: (NOTE) The Xpert Xpress SARS-CoV-2/FLU/RSV plus assay is intended as an aid in the diagnosis of influenza from Nasopharyngeal swab specimens and should not be used as a sole basis for treatment. Nasal washings and aspirates are unacceptable for Xpert Xpress SARS-CoV-2/FLU/RSV testing.  Fact Sheet for Patients: BloggerCourse.com  Fact Sheet for Healthcare Providers: SeriousBroker.it  This test is not yet approved or cleared by the [place] FDA and has been authorized for detection and/or diagnosis of SARS-CoV-2 by FDA under an Emergency Use Authorization (EUA). This EUA will remain in effect (meaning this test can be used) for the duration of the COVID-19 declaration under Section 564(b)(1) of the Act, 21 U.S.C. section 360bbb-3(b)(1), unless the authorization is terminated or revoked.     Resp Syncytial Virus by PCR NEGATIVE NEGATIVE Final    Comment: (NOTE) Fact Sheet for Patients: BloggerCourse.com  Fact Sheet for Healthcare  Providers: SeriousBroker.it  This test is not yet approved or cleared by the [place] FDA and has been authorized for detection and/or diagnosis of SARS-CoV-2 by FDA under an Emergency Use Authorization (EUA). This EUA will remain in effect (meaning this test can be used) for the duration of the COVID-19 declaration under Section 564(b)(1) of the Act, 21 U.S.C. section 360bbb-3(b)(1), unless the authorization is terminated or revoked.  Performed at [facility], [address] [place], [place] [address]   Blood Culture (routine x 2)     Status: None   Collection Time: 12/03/22  3:45 PM   Specimen: BLOOD  Result Value Ref Range Status   Specimen Description BLOOD BLOOD LEFT ARM  Final   Special Requests   Final    BOTTLES DRAWN AEROBIC AND ANAEROBIC Blood Culture adequate volume  Culture   Final    NO GROWTH 5 DAYS Performed at [facility], [address] [place] [place], [place] [address]    Report Status 12/08/2022 FINAL  Final  Blood Culture (routine x 2)     Status: None   Collection Time: 12/03/22  4:30 PM   Specimen: BLOOD  Result Value Ref Range Status   Specimen Description BLOOD BLOOD LEFT ARM  Final   Special Requests   Final    BOTTLES DRAWN AEROBIC AND ANAEROBIC Blood Culture adequate volume   Culture   Final    NO GROWTH 5 DAYS Performed at [facility], [address] [place], [place] [address]    Report Status 12/08/2022 FINAL  Final    Coagulation Studies: No results for input(s): "LABPROT", "INR" in the last 72 hours.   Urinalysis: No results for input(s): "COLORURINE", "LABSPEC", "PHURINE", "GLUCOSEU", "HGBUR", "BILIRUBINUR", "KETONESUR", "PROTEINUR", "UROBILINOGEN", "NITRITE", "LEUKOCYTESUR" in the last 72 hours.  Invalid input(s): "APPERANCEUR"     Imaging: No results found.   Medications:    anticoagulant sodium citrate       allopurinol  100 mg Oral Daily   amLODipine  10 mg Oral  Daily   anastrozole  1 mg Oral Daily   apixaban  2.5 mg Oral BID   carvedilol  12.5 mg Oral BID WC   Chlorhexidine Gluconate Cloth  6 each Topical Q0600   [START ON 12/12/2022] furosemide  40 mg Oral Once per day on Sun Tue Thu Sat   isosorbide mononitrate  30 mg Oral Daily   levothyroxine  88 mcg Oral QAC breakfast   pantoprazole  40 mg Oral QHS   polyethylene glycol  17 g Oral Daily   sodium bicarbonate  1,300 mg Oral BID   acetaminophen, alteplase, anticoagulant sodium citrate, heparin, lidocaine (PF), lidocaine-prilocaine, oxyCODONE, pentafluoroprop-tetrafluoroeth, senna-docusate  Assessment/ Plan:  Alexis Tucker is a 82 y.o.  female with breast cancer,  hypertension, hypothyroidism, GERD, atrial fibrillation, who was originally admitted to [facility] on 12/03/2022 for SOB (shortness of breath) [R06.02] Acute respiratory failure with hypoxia (HCC) [J96.01] Acute hypoxemic respiratory failure (HCC) [J96.01] Community acquired pneumonia of left upper lobe of lung [J18.9]  End stage renal disease requiring hemodialysis -Due for worsening creatinine and intermittent.  shortness of breath, we feel this is a progression of disease and time to initiate renal replacement therapy.  Patient and family agreeable to proceed. - Appreciate vascular surgery placing right chest PermCath on 12/08/2022. - Third dialysis treatment scheduled for later today - Next treatment scheduled for Wednesday - Outpatient clinic search in progress  Hypertension with chronic kidney disease: continue carvedilol, isosorbide mononitrate and amlodipine.  Blood pressure stable  Anemia of chronic kidney disease: hemoglobin 9.9. acceptable   Acute exacerbation of chronic congestive heart failure -Continue fluid management with furosemide and hemodialysis.    LOS: 8   6/10/20243:23 PM

## 2022-12-11 NOTE — Progress Notes (Signed)
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  Will check a pulse ox on room air with ambulation.  Patient still undecided on dialysis overnight.  Creatinine worsened to 6.75 with a GFR of 6.  Repeat hemoglobin still low at 7.4. 6/6.  Patient had some abdominal pain today.  Abdominal x-ray read as negative.  Could be constipation related.  MiraLAX prescribed.  Will transfuse 1 unit of packed red blood cells on hemoglobin of 7.3.  Creatinine worsened to 7.13 and patient now agreeable to start dialysis.  PermCath will be placed tomorrow and dialysis will start tomorrow. 6/7.  No abdominal pain this morning.  Hemoglobin up to 8.6 with transfusion yesterday.  PermCath placement today.  Will start dialysis today. 6/8.  Hemoglobin up to 10.3.  Looks like came off oxygen this afternoon.  Second dialysis session today. 6/9.  Hemoglobin 9.3.  Off oxygen this morning.  Change Lasix to 40 mg on nondialysis days. 6/10.  Third dialysis session today.  Hemoglobin 9.9 and creatinine 4.66.  Will need outpatient dialysis slot and a rehab bed prior to disposition.  Assessment and Plan: * ESRD (end stage renal disease) (HCC) Third dialysis session today.  Had PermCath placed on 6/7.  Stable to go out to rehab once we have a bed and a outpatient dialysis slot.  Acute respiratory failure with hypoxia (HCC) Resolved.  Likely secondary to multifocal pneumonia and fluid overload.  Patient on room air for past couple days.  Acute on chronic diastolic CHF (congestive heart failure) (HCC) Change Lasix  to 40 mg daily on nondialysis days.  Patient on Coreg.  Multifocal pneumonia Completed antibiotics  Paroxysmal atrial fibrillation (HCC) On Coreg and Eliquis  HTN (hypertension) On amlodipine and Coreg  Type 2 diabetes mellitus with other specified complication (HCC) Last hemoglobin A1c 6.1.  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  Continue MiraLAX just in case constipation related.  Thrombocytopenia (HCC) Last platelet count 135.  Hepatitis B core antibody positive.  Will check hepatitis B viral load.  Hepatitis C negative.  GERD (gastroesophageal reflux disease) On Protonix  Anemia of chronic disease Received 1 unit of packed red blood cells during the hospital course.  Last hemoglobin 9.3.  Malignant neoplasm of upper-outer quadrant of left breast in female, estrogen receptor positive (HCC) Anastrozole 1 mg daily resumed        Subjective: Patient feels okay.  Offers no complaints.  Third dialysis session today.  Admitted with acute respiratory failure.  Physical Exam: Vitals:   12/11/22 1231 12/11/22 1247 12/11/22 1305 12/11/22 1330  BP: (!) 155/72  123/70 128/70  Pulse: 77  74 78  Resp: 15  19 17   Temp:      TempSrc:      SpO2: 97%  96% 98%  Weight:  76 kg    Height:       Physical Exam HENT:     Head: Normocephalic.     Mouth/Throat:     Pharynx: No oropharyngeal exudate.  Eyes:     General: Lids are normal.     Conjunctiva/sclera: Conjunctivae normal.  Cardiovascular:     Rate and Rhythm: Normal rate and regular rhythm.     Heart sounds: Normal heart sounds, S1 normal and S2 normal.  Pulmonary:     Breath sounds: Examination of the right-lower field reveals decreased breath sounds. Examination of the left-lower field reveals decreased breath sounds. Decreased breath sounds present. No wheezing, rhonchi or rales.  Abdominal:     Palpations: Abdomen is soft.     Tenderness: There is no abdominal tenderness.   Musculoskeletal:     Right lower leg: Swelling present.     Left lower leg: Swelling present.  Skin:    General: Skin is warm.     Findings: No rash.  Neurological:     Mental Status: She is alert and oriented to person, place, and time.     Data Reviewed: Hemoglobin 9.9, creatinine 4.66, potassium 3.4  Family Communication: Updated patient's daughter on the phone  Disposition: Status is: Inpatient Remains inpatient appropriate because: Patient receiving third dialysis session today.  Stable to go out to rehab once bed available and we have an outpatient dialysis slot.  Planned Discharge Destination: Rehab    Time spent: 27 minutes  Author: Alford Highland, MD 12/11/2022 1:54 PM  For on call review [email or web address].

## 2022-12-12 DIAGNOSIS — I5033 Acute on chronic diastolic (congestive) heart failure: Secondary | ICD-10-CM | POA: Diagnosis not present

## 2022-12-12 DIAGNOSIS — J189 Pneumonia, unspecified organism: Secondary | ICD-10-CM | POA: Diagnosis not present

## 2022-12-12 DIAGNOSIS — J9601 Acute respiratory failure with hypoxia: Secondary | ICD-10-CM | POA: Diagnosis not present

## 2022-12-12 DIAGNOSIS — N186 End stage renal disease: Secondary | ICD-10-CM | POA: Diagnosis not present

## 2022-12-12 LAB — HEPATITIS B SURFACE ANTIBODY, QUANTITATIVE
Hep B S AB Quant (Post): 23.1 m[IU]/mL (ref >9.9)
Hep B S AB Quant (Post): 19.4 m[IU]/mL (ref >9.9)

## 2022-12-12 LAB — GLUCOSE, CAPILLARY: Glucose-Capillary: 151 mg/dL — ABNORMAL HIGH (ref 70–99)

## 2022-12-12 MED ORDER — NEPRO/CARBSTEADY PO LIQD
237.0000 mL | Freq: Three times a day (TID) | ORAL | Status: DC
  Administered 2022-12-13 – 2022-12-15 (×5): 237 mL via ORAL

## 2022-12-12 NOTE — Progress Notes (Signed)
[facility]  ROUNDING NOTE   Subjective:   [name] was admitted to [facility] on 12/03/2022 for SOB (shortness of breath) [R06.02] Acute respiratory failure with hypoxia (HCC) [J96.01] Acute hypoxemic respiratory failure (HCC) [J96.01] Community acquired pneumonia of left upper lobe of lung [J18.9]  Patient sitting at side of bed Alert and oriented to self Independently eating breakfast after assistance with setup Pleasant mood  Denies discomfort.     Objective:  Vital signs in last 24 hours:  Temp:  [97.7 F (36.5 C)-98.9 F (37.2 C)] 98 F (36.7 C) (06/11 1546) Pulse Rate:  [70-79] 72 (06/11 1546) Resp:  [16-18] 17 (06/11 1546) BP: (101-188)/(55-77) 131/59 (06/11 1546) SpO2:  [95 %-100 %] 100 % (06/11 1142) Weight:  [76 kg] 76 kg (06/10 1618)  Weight change:  Filed Weights   12/09/22 1743 12/11/22 1247 12/11/22 1618  Weight: 76.7 kg 76 kg 76 kg    Intake/Output: I/O last 3 completed shifts: In: 390 [P.O.:390] Out: 1150 [Urine:1150]   Intake/Output this shift:  Total I/O In: 840 [P.O.:840] Out: -   Physical Exam: General: NAD  Head: Normocephalic, atraumatic. Moist oral mucosal membranes  Eyes: Anicteric  Lungs:  Clear to auscultation  Heart: Regular rate and rhythm  Abdomen:  Soft, nontender  Extremities:  trace peripheral edema.  Neurologic: Alert and oriented to self, moving all four extremities  Skin: No lesions  Access: AVG - no bruit or thrill, right chest PermCath placed on 12/08/2022    Basic Metabolic Panel: Recent Labs  Lab 12/06/22 1135 12/07/22 0947 12/08/22 0524 12/09/22 0529 12/11/22 0510  NA 140 140 144 140 140  K 3.6 3.9 3.5 3.6 3.4*  CL 107 107 109 102 103  CO2 23 18* 24 25 27   GLUCOSE 172* 185* 119* 106* 111*  BUN 63* 64* 63* 42* 28*  CREATININE 6.75* 7.13* 6.97* 5.18* 4.66*  CALCIUM 8.4* 8.8* 8.6* 8.6* 8.6*     Liver Function Tests: Recent Labs  Lab 12/08/22 0524  AST 14*  ALT 10  ALKPHOS 55   BILITOT 0.6  PROT 6.6  ALBUMIN 3.2*    No results for input(s): "LIPASE", "AMYLASE" in the last 168 hours. No results for input(s): "AMMONIA" in the last 168 hours.  CBC: Recent Labs  Lab 12/07/22 0947 12/08/22 0524 12/09/22 0529 12/10/22 0510 12/11/22 0510  WBC  --  4.4 5.5  --   --   HGB 7.3* 8.6* 10.3* 9.3* 9.9*  HCT  --  26.9* 32.3*  --   --   MCV  --  93.1 93.4  --   --   PLT  --  136* 135*  --   --      Cardiac Enzymes: No results for input(s): "CKTOTAL", "CKMB", "CKMBINDEX", "TROPONINI" in the last 168 hours.  BNP: Invalid input(s): "POCBNP"  CBG: Recent Labs  Lab 12/08/22 0857 12/08/22 1018 12/12/22 1600  GLUCAP 91 104* 151*     Microbiology: Results for orders placed or performed during the hospital encounter of 12/03/22  Resp panel by RT-PCR (RSV, Flu A&B, Covid) Anterior Nasal Swab     Status: None   Collection Time: 12/03/22  3:45 PM   Specimen: Anterior Nasal Swab  Result Value Ref Range Status   SARS Coronavirus 2 by RT PCR NEGATIVE NEGATIVE Final    Comment: (NOTE) SARS-CoV-2 target nucleic acids are NOT DETECTED.  The SARS-CoV-2 RNA is generally detectable in upper respiratory specimens during the acute phase of infection. The  lowest concentration of SARS-CoV-2 viral copies this assay can detect is 138 copies/mL. A negative result does not preclude SARS-Cov-2 infection and should not be used as the sole basis for treatment or other patient management decisions. A negative result may occur with  improper specimen collection/handling, submission of specimen other than nasopharyngeal swab, presence of viral mutation(s) within the areas targeted by this assay, and inadequate number of viral copies(<138 copies/mL). A negative result must be combined with clinical observations, patient history, and epidemiological information. The expected result is Negative.  Fact Sheet for Patients:  BloggerCourse.com  Fact Sheet  for Healthcare Providers:  SeriousBroker.it  This test is no t yet approved or cleared by the [place] FDA and  has been authorized for detection and/or diagnosis of SARS-CoV-2 by FDA under an Emergency Use Authorization (EUA). This EUA will remain  in effect (meaning this test can be used) for the duration of the COVID-19 declaration under Section 564(b)(1) of the Act, 21 U.S.C.section 360bbb-3(b)(1), unless the authorization is terminated  or revoked sooner.       Influenza A by PCR NEGATIVE NEGATIVE Final   Influenza B by PCR NEGATIVE NEGATIVE Final    Comment: (NOTE) The Xpert Xpress SARS-CoV-2/FLU/RSV plus assay is intended as an aid in the diagnosis of influenza from Nasopharyngeal swab specimens and should not be used as a sole basis for treatment. Nasal washings and aspirates are unacceptable for Xpert Xpress SARS-CoV-2/FLU/RSV testing.  Fact Sheet for Patients: BloggerCourse.com  Fact Sheet for Healthcare Providers: SeriousBroker.it  This test is not yet approved or cleared by the [place] FDA and has been authorized for detection and/or diagnosis of SARS-CoV-2 by FDA under an Emergency Use Authorization (EUA). This EUA will remain in effect (meaning this test can be used) for the duration of the COVID-19 declaration under Section 564(b)(1) of the Act, 21 U.S.C. section 360bbb-3(b)(1), unless the authorization is terminated or revoked.     Resp Syncytial Virus by PCR NEGATIVE NEGATIVE Final    Comment: (NOTE) Fact Sheet for Patients: BloggerCourse.com  Fact Sheet for Healthcare Providers: SeriousBroker.it  This test is not yet approved or cleared by the [place] FDA and has been authorized for detection and/or diagnosis of SARS-CoV-2 by FDA under an Emergency Use Authorization (EUA). This EUA will remain in effect (meaning  this test can be used) for the duration of the COVID-19 declaration under Section 564(b)(1) of the Act, 21 U.S.C. section 360bbb-3(b)(1), unless the authorization is terminated or revoked.  Performed at [facility], [address] [place], [place] [address]   Blood Culture (routine x 2)     Status: None   Collection Time: 12/03/22  3:45 PM   Specimen: BLOOD  Result Value Ref Range Status   Specimen Description BLOOD BLOOD LEFT ARM  Final   Special Requests   Final    BOTTLES DRAWN AEROBIC AND ANAEROBIC Blood Culture adequate volume   Culture   Final    NO GROWTH 5 DAYS Performed at [facility], [address] [place], [place] [address]    Report Status 12/08/2022 FINAL  Final  Blood Culture (routine x 2)     Status: None   Collection Time: 12/03/22  4:30 PM   Specimen: BLOOD  Result Value Ref Range Status   Specimen Description BLOOD BLOOD LEFT ARM  Final   Special Requests   Final    BOTTLES DRAWN AEROBIC AND ANAEROBIC Blood Culture adequate volume   Culture   Final  NO GROWTH 5 DAYS Performed at [facility], [address] [place], [place] [address]    Report Status 12/08/2022 FINAL  Final    Coagulation Studies: No results for input(s): "LABPROT", "INR" in the last 72 hours.   Urinalysis: No results for input(s): "COLORURINE", "LABSPEC", "PHURINE", "GLUCOSEU", "HGBUR", "BILIRUBINUR", "KETONESUR", "PROTEINUR", "UROBILINOGEN", "NITRITE", "LEUKOCYTESUR" in the last 72 hours.  Invalid input(s): "APPERANCEUR"     Imaging: No results found.   Medications:    anticoagulant sodium citrate       allopurinol  100 mg Oral Daily   amLODipine  10 mg Oral Daily   anastrozole  1 mg Oral Daily   apixaban  2.5 mg Oral BID   carvedilol  12.5 mg Oral BID WC   Chlorhexidine Gluconate Cloth  6 each Topical Q0600   feeding supplement (NEPRO CARB STEADY)  237 mL Oral TID BM   furosemide  40 mg Oral Once per day on Sun Tue Thu Sat    isosorbide mononitrate  30 mg Oral Daily   levothyroxine  88 mcg Oral QAC breakfast   pantoprazole  40 mg Oral QHS   polyethylene glycol  17 g Oral Daily   sodium bicarbonate  1,300 mg Oral BID   acetaminophen, alteplase, anticoagulant sodium citrate, heparin, lidocaine (PF), lidocaine-prilocaine, oxyCODONE, pentafluoroprop-tetrafluoroeth, senna-docusate  Assessment/ Plan:  [name] is a 82 y.o.  female with breast cancer,  hypertension, hypothyroidism, GERD, atrial fibrillation, who was originally admitted to [facility] on 12/03/2022 for SOB (shortness of breath) [R06.02] Acute respiratory failure with hypoxia (HCC) [J96.01] Acute hypoxemic respiratory failure (HCC) [J96.01] Community acquired pneumonia of left upper lobe of lung [J18.9]  End stage renal disease requiring hemodialysis -Due for worsening creatinine and intermittent.  shortness of breath, we feel this is a progression of disease and time to initiate renal replacement therapy.  Patient and family agreeable to proceed. - Appreciate vascular surgery placing right chest PermCath on 12/08/2022. - Next treatment scheduled for Wednesday, seated in chair. - Outpatient clinic search in progress  Hypertension with chronic kidney disease: continue carvedilol, isosorbide mononitrate and amlodipine.  Blood pressure 131/59  Anemia of chronic kidney disease: hemoglobin 9.9. stable  Acute exacerbation of chronic congestive heart failure -Continue fluid management with furosemide and hemodialysis.    LOS: 9   6/11/20244:16 PM

## 2022-12-12 NOTE — Progress Notes (Signed)
Physical Therapy Treatment Patient Details Name: Alexis Tucker MRN: [id number] DOB: 10-19-1940 Today's Date: 12/12/2022   History of Present Illness Patient is a 82 year old female presenting with concerns of labored breathing, short of breath, hypoxia. 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    PT Comments    Pt sitting on BSC upon arrival with nursing in room.  Therapist took over for peri-care and assisted pt with  transfers and ambulation outside of the room.  Due to visual deficiencies, pt requires minA for ambulation with the RW and guidance.  Pt did become slightly agitated with therapist and daughter when asked about the R knee buckling and checking to see if pt was too fatigued.  Pt continued to ambulate and then returned back to the bed.  Pt given all needs and purewick removed after speaking with nurse who requested it to be removed since pt is able to ambulate and transfer with minA.  Pt left in bed with all needs met and call bell within reach.     Recommendations for follow up therapy are one component of a multi-disciplinary discharge planning process, led by the attending physician.  Recommendations may be updated based on patient status, additional functional criteria and insurance authorization.   Assistance Recommended at Discharge Intermittent Supervision/Assistance  Patient can return home with the following A little help with walking and/or transfers;A little help with bathing/dressing/bathroom;Assistance with cooking/housework;Help with stairs or ramp for entrance;Assist for transportation   Equipment Recommendations   (Defer to next level of care.)    Recommendations for Other Services       Precautions / Restrictions Precautions Precautions: Fall Precaution Comments: vision impaired Restrictions Weight Bearing  Restrictions: No     Mobility  Bed Mobility           Sit to supine: Min guard   General bed mobility comments: pt upright on toilet upon arrival and returned to bed.    Transfers Overall transfer level: Needs assistance Equipment used: Rolling walker (2 wheels) Transfers: Sit to/from Stand Sit to Stand: Min assist           General transfer comment: Min assist initially to stand but is able to perform just very slowly.    Ambulation/Gait Ambulation/Gait assistance: Min assist Gait Distance (Feet): 60 Feet Assistive device: Rolling walker (2 wheels) Gait Pattern/deviations: Step-to pattern Gait velocity: decreased     General Gait Details: Pt able to ambulate to the nursing station and then started to have noticable R LE buckling.  Pt became agitated when daughter and therapist asked questions about her being fatigued.   Stairs             Wheelchair Mobility    Modified Rankin (Stroke Patients Only)       Balance Overall balance assessment: Needs assistance Sitting-balance support: Feet supported Sitting balance-Leahy Scale: Good     Standing balance support: Bilateral upper extremity supported, During functional activity Standing balance-Leahy Scale: Fair                              Cognition Arousal/Alertness: Awake/alert Behavior During Therapy: Flat affect, WFL for tasks assessed/performed Overall Cognitive Status:  (Per daughter, Pt has had slightly chabnge in cognition over past few months)  General Comments: Pt awake upon arrival and becomes slightly agitated during ambulation.        Exercises      General Comments        Pertinent Vitals/Pain Pain Assessment Pain Assessment: No/denies pain    Home Living                          Prior Function            PT Goals (current goals can now be found in the care plan section) Acute Rehab PT  Goals Patient Stated Goal: to go home. PT Goal Formulation: With patient Time For Goal Achievement: 12/19/22 Potential to Achieve Goals: Fair Progress towards PT goals: Progressing toward goals    Frequency    Min 3X/week      PT Plan Discharge plan needs to be updated    Co-evaluation              AM-PAC PT "6 Clicks" Mobility   Outcome Measure  Help needed turning from your back to your side while in a flat bed without using bedrails?: None Help needed moving from lying on your back to sitting on the side of a flat bed without using bedrails?: A Little Help needed moving to and from a bed to a chair (including a wheelchair)?: A Little Help needed standing up from a chair using your arms (e.g., wheelchair or bedside chair)?: A Little Help needed to walk in hospital room?: A Little Help needed climbing 3-5 steps with a railing? : Total 6 Click Score: 17    End of Session Equipment Utilized During Treatment: Oxygen Activity Tolerance: Patient limited by fatigue Patient left: in bed;with call bell/phone within reach;with bed alarm set Nurse Communication: Mobility status PT Visit Diagnosis: Muscle weakness (generalized) (M62.81);Unsteadiness on feet (R26.81)     Time: 4540-9811 PT Time Calculation (min) (ACUTE ONLY): 34 min  Charges:  $Therapeutic Activity: 23-37 mins                     Nolon Bussing, PT, DPT Physical Therapist -   [facility]  12/12/22, 1:30 PM

## 2022-12-12 NOTE — Progress Notes (Signed)
Occupational Therapy Treatment Patient Details Name: Alexis Tucker MRN: [id number] DOB: February 19, 1941 Today's Date: 12/12/2022   History of present illness Patient is a 82 year old female presenting with concerns of labored breathing, short of breath, hypoxia. 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   OT comments  Pt seen for OT tx. Pt sleeping but wakes easily and agreeable. Pt required supv for bed mobility with increased effort/time to perform. Once EOB required MIN A for STS and step pivot with RW to [facility] with VC for sequencing 2/2 low vision but also to correct for posterior lean as she keeps her toes off the ground. MIN A for thoroughness with pericare after urinating. Progressing towards goals, continues to benefit from skilled OT.    Recommendations for follow up therapy are one component of a multi-disciplinary discharge planning process, led by the attending physician.  Recommendations may be updated based on patient status, additional functional criteria and insurance authorization.    Assistance Recommended at Discharge Frequent or constant Supervision/Assistance  Patient can return home with the following  A little help with walking and/or transfers;A little help with bathing/dressing/bathroom;Assistance with cooking/housework;Assist for transportation;Help with stairs or ramp for entrance;Direct supervision/assist for financial management;Direct supervision/assist for medications management   Equipment Recommendations  Other (comment) (defer to next venue)    Recommendations for Other Services      Precautions / Restrictions Precautions Precautions: Fall Precaution Comments: vision impaired Restrictions Weight Bearing Restrictions: No       Mobility Bed Mobility Overal bed mobility: Needs Assistance Bed Mobility: Supine to Sit,  Sit to Supine     Supine to sit: Supervision, HOB elevated Sit to supine: Supervision        Transfers Overall transfer level: Needs assistance Equipment used: Rolling walker (2 wheels) Transfers: Sit to/from Stand Sit to Stand: Min assist     Step pivot transfers: Min assist     General transfer comment: VC for sequencing 2/2 low vision, heavy posterior lean rocking back on heels, requiring VC to correct     Balance Overall balance assessment: Needs assistance Sitting-balance support: Feet supported Sitting balance-Leahy Scale: Good   Postural control: Posterior lean Standing balance support: Bilateral upper extremity supported, During functional activity, Single extremity supported, Reliant on assistive device for balance Standing balance-Leahy Scale: Poor Standing balance comment: requires UE support on RW while completing pericare in standing, VC to correct for posterior lean                           ADL either performed or assessed with clinical judgement   ADL Overall ADL's : Needs assistance/impaired     Grooming: Sitting;Wash/dry hands;Set up;Supervision/safety               Lower Body Dressing: Sitting/lateral leans;Minimal assistance Lower Body Dressing Details (indicate cue type and reason): MIN A to doff soiled brief over feet Toilet Transfer: Minimal assistance;BSC/3in1;Rolling walker (2 wheels) Toilet Transfer Details (indicate cue type and reason): VC for sequencing 2/2 low vision, MIN A 2/2 decr balance/posterior lean Toileting- Clothing Manipulation and Hygiene: Min guard;Minimal assistance;Sit to/from stand;Set up Toileting - Clothing Manipulation Details (indicate cue type and reason): CGA and set up in standing, MIN A for thoroughness     Functional mobility during ADLs: Minimal assistance;Cueing for safety;Cueing for sequencing;Rolling walker (2 wheels)  Extremity/Trunk Assessment              Vision Baseline  Vision/History: 2 Legally blind Patient Visual Report: No change from baseline     Perception     Praxis      Cognition Arousal/Alertness: Awake/alert Behavior During Therapy: WFL for tasks assessed/performed Overall Cognitive Status: No family/caregiver present to determine baseline cognitive functioning                                 General Comments: pt follows commands well, pleasant        Exercises      Shoulder Instructions       General Comments      Pertinent Vitals/ Pain       Pain Assessment Pain Assessment: No/denies pain  Home Living                                          Prior Functioning/Environment              Frequency  Min 2X/week        Progress Toward Goals  OT Goals(current goals can now be found in the care plan section)  Progress towards OT goals: Progressing toward goals  Acute Rehab OT Goals Patient Stated Goal: get stronger and return to PLOF OT Goal Formulation: With patient Time For Goal Achievement: 12/23/22 Potential to Achieve Goals: Fair  Plan Discharge plan remains appropriate;Frequency remains appropriate    Co-evaluation                 AM-PAC OT "6 Clicks" Daily Activity     Outcome Measure   Help from another person eating meals?: None Help from another person taking care of personal grooming?: A Little Help from another person toileting, which includes using toliet, bedpan, or urinal?: A Little Help from another person bathing (including washing, rinsing, drying)?: A Little Help from another person to put on and taking off regular upper body clothing?: A Little Help from another person to put on and taking off regular lower body clothing?: A Little 6 Click Score: 19    End of Session Equipment Utilized During Treatment: Rolling walker (2 wheels)  OT Visit Diagnosis: Unsteadiness on feet (R26.81);Muscle weakness (generalized) (M62.81);Repeated falls (R29.6)    Activity Tolerance Patient tolerated treatment well   Patient Left in bed;with call bell/phone within reach;with bed alarm set   Nurse Communication          Time: 6045-4098 OT Time Calculation (min): 12 min  Charges: OT General Charges $OT Visit: 1 Visit OT Treatments $Self Care/Home Management : 8-22 mins  Arman Filter., MPH, MS, OTR/L ascom [phone] 12/12/22, 3:26 PM

## 2022-12-12 NOTE — TOC Progression Note (Addendum)
Transition of Care [facility]) - Progression Note    Patient Details  Name: Alexis Tucker MRN: [id number] Date of Birth: September 01, 1940  Transition of Care [facility] CM/SW Contact  Truddie Hidden, RN Phone Number: 12/12/2022, 1:13 PM  Clinical Narrative:    Sherron Monday with [name], patient's daughter to give bed offers for [facility], [facility] and [facility]. She would like time to make a decision. She was advised outpatient HD has to be coordinated with the facility. [name] verbalized her understanding and will make a decision by tomorrow.   4:00pm Spoke with patient's daughter, [name]. They have decided on [facility] for SNF. She was advised the dialysis coordinator will arrange a HD clinic.      Barriers to Discharge: Continued Medical Work up  Expected Discharge Plan and Services       Living arrangements for the past 2 months: Single Family Home                           HH Arranged: PT HH Agency: [facility] Date HH Agency Contacted: 12/07/22 Time HH Agency Contacted: 1257 Representative spoke with at [facility] Agency: Barbara Cower   Social Determinants of Health (SDOH) Interventions SDOH Screenings   Food Insecurity: No Food Insecurity (12/05/2022)  Housing: Low Risk  (12/05/2022)  Transportation Needs: No Transportation Needs (12/05/2022)  Utilities: Not At Risk (12/05/2022)  Depression (PHQ2-9): Low Risk  (09/25/2022)  Financial Resource Strain: Low Risk  (12/28/2021)  Physical Activity: Inactive (12/28/2021)  Stress: No Stress Concern Present (12/28/2021)  Tobacco Use: Medium Risk (12/11/2022)    Readmission Risk Interventions    12/07/2022   12:55 PM  Readmission Risk Prevention Plan  Transportation Screening Complete  PCP or Specialist Appt within 3-5 Days Complete  HRI or Home Care Consult Complete  Social Work Consult for Recovery Care Planning/Counseling Complete  Palliative Care Screening Not Applicable  Medication Review Oceanographer)  Complete

## 2022-12-12 NOTE — Progress Notes (Signed)
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  Will check a pulse ox on room air with ambulation.  Patient still undecided on dialysis overnight.  Creatinine worsened to 6.75 with a GFR of 6.  Repeat hemoglobin still low at 7.4. 6/6.  Patient had some abdominal pain today.  Abdominal x-ray read as negative.  Could be constipation related.  MiraLAX prescribed.  Will transfuse 1 unit of packed red blood cells on hemoglobin of 7.3.  Creatinine worsened to 7.13 and patient now agreeable to start dialysis.  PermCath will be placed tomorrow and dialysis will start tomorrow. 6/7.  No abdominal pain this morning.  Hemoglobin up to 8.6 with transfusion yesterday.  PermCath placement today.  Will start dialysis today. 6/8.  Hemoglobin up to 10.3.  Looks like came off oxygen this afternoon.  Second dialysis session today. 6/9.  Hemoglobin 9.3.  Off oxygen this morning.  Change Lasix to 40 mg on nondialysis days. 6/10.  Third dialysis session today.  Hemoglobin 9.9 and creatinine 4.66.  Will need outpatient dialysis slot and a rehab bed prior to disposition. 6/11.  Called to see patient this morning because she refused to take meds.  Reoriented.  Patient was able to speak with her daughter on the phone with nursing staff help.  Assessment and Plan: * ESRD (end stage renal disease) (HCC) Third dialysis session on 6/10.  Had PermCath placed on 6/7.  Stable to go out to rehab once we have a bed and a outpatient dialysis slot.  Acute respiratory failure with hypoxia (HCC) Resolved.  Likely  secondary to multifocal pneumonia and fluid overload.  Patient on room air for past couple days.  Acute on chronic diastolic CHF (congestive heart failure) (HCC) Change Lasix to 40 mg daily on nondialysis days.  Patient on Coreg.  Multifocal pneumonia Completed antibiotics  Paroxysmal atrial fibrillation (HCC) On Coreg and Eliquis  HTN (hypertension) On amlodipine and Coreg  Type 2 diabetes mellitus with other specified complication (HCC) Last hemoglobin A1c 6.1.  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  Continue MiraLAX just in case constipation related.  Thrombocytopenia (HCC) Last platelet count 135.  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  Hepatitis viral load negative.  Patient likely had exposure to hepatitis B in the past and now immune.  Hepatitis C negative.  GERD (gastroesophageal reflux disease) On Protonix  Anemia of chronic disease Received 1 unit of packed red blood cells during the hospital course.  Last hemoglobin 9.3.  Malignant neoplasm of upper-outer quadrant of left breast in female, estrogen receptor positive (HCC) Anastrozole 1 mg daily resumed        Subjective: Patient a little confused this morning was sitting at the edge of the bed.  Called to see patient because she did not want to take her medications.  When I came into the room she was agreeable to taking her medications.  Nursing staff was able to call her daughter so she can speak with her.  Initially admitted with acute respiratory failure and pneumonia.  Physical Exam: Vitals:  12/12/22 0419 12/12/22 0739 12/12/22 1028 12/12/22 1142  BP: (!) 159/64 (!) 188/77 (!) 106/55 (!) 117/58  Pulse: 75 79 75 76  Resp: 18 18 18 16   Temp: 98.9 F (37.2 C) 98.6 F (37 C)    TempSrc:  Oral    SpO2: 99% 95% 98% 100%  Weight:      Height:       Physical Exam HENT:     Head: Normocephalic.     Mouth/Throat:     Pharynx: No  oropharyngeal exudate.  Eyes:     General: Lids are normal.     Conjunctiva/sclera: Conjunctivae normal.  Cardiovascular:     Rate and Rhythm: Normal rate and regular rhythm.     Heart sounds: Normal heart sounds, S1 normal and S2 normal.  Pulmonary:     Breath sounds: No decreased breath sounds, wheezing, rhonchi or rales.  Abdominal:     Palpations: Abdomen is soft.     Tenderness: There is no abdominal tenderness.  Musculoskeletal:     Right lower leg: Swelling present.     Left lower leg: Swelling present.  Skin:    General: Skin is warm.     Findings: No rash.  Neurological:     Mental Status: She is alert and oriented to person, place, and time.     Data Reviewed: Last hemoglobin 9.9  Family Communication: Updated patient's daughter on the phone  Disposition: Status is: Inpatient Remains inpatient appropriate because: Medically stable to go out to rehab once we find a bed and have an outpatient dialysis slot.  Planned Discharge Destination: Rehab    Time spent: 27 minutes  Author: Alford Highland, MD 12/12/2022 2:07 PM  For on call review [email or web address].

## 2022-12-13 DIAGNOSIS — N186 End stage renal disease: Secondary | ICD-10-CM | POA: Diagnosis not present

## 2022-12-13 LAB — BASIC METABOLIC PANEL
Anion gap: 7 (ref 5–15)
BUN: 19 mg/dL (ref 8–23)
CO2: 31 mmol/L (ref 22–32)
Calcium: 8.8 mg/dL — ABNORMAL LOW (ref 8.9–10.3)
Chloride: 104 mmol/L (ref 98–111)
Creatinine, Ser: 4.3 mg/dL — ABNORMAL HIGH (ref 0.44–1.00)
GFR, Estimated: 10 mL/min — ABNORMAL LOW (ref >60)
Glucose, Bld: 104 mg/dL — ABNORMAL HIGH (ref 70–99)
Potassium: 3.9 mmol/L (ref 3.5–5.1)
Sodium: 142 mmol/L (ref 135–145)

## 2022-12-13 LAB — CBC
HCT: 30.5 % — ABNORMAL LOW (ref 36.0–46.0)
Hemoglobin: 9.4 g/dL — ABNORMAL LOW (ref 12.0–15.0)
MCH: 29.4 pg (ref 26.0–34.0)
MCHC: 30.8 g/dL (ref 30.0–36.0)
MCV: 95.3 fL (ref 80.0–100.0)
Platelets: 168 10*3/uL (ref 150–400)
RBC: 3.2 MIL/uL — ABNORMAL LOW (ref 3.87–5.11)
RDW: 14.2 % (ref 11.5–15.5)
WBC: 6.1 10*3/uL (ref 4.0–10.5)
nRBC: 0 % (ref 0.0–0.2)

## 2022-12-13 MED ORDER — RENA-VITE PO TABS
1.0000 | ORAL_TABLET | Freq: Every day | ORAL | Status: DC
  Administered 2022-12-13 – 2022-12-14 (×2): 1 via ORAL
  Filled 2022-12-13 (×3): qty 1

## 2022-12-13 MED ORDER — LINAGLIPTIN 5 MG PO TABS
5.0000 mg | ORAL_TABLET | Freq: Every day | ORAL | Status: DC
  Administered 2022-12-14 – 2022-12-15 (×2): 5 mg via ORAL
  Filled 2022-12-13 (×3): qty 1

## 2022-12-13 MED ORDER — HEPARIN SODIUM (PORCINE) 1000 UNIT/ML IJ SOLN
INTRAMUSCULAR | Status: AC
  Filled 2022-12-13: qty 10

## 2022-12-13 NOTE — Progress Notes (Signed)
[facility]  - [place] [facility]   PATIENT NAME: Alexis Tucker    MR#:  [id number]  DATE OF BIRTH:  23-Nov-1940  SUBJECTIVE:  patient seen at dialysis. Denies any complaints. Tolerating dialysis well.    VITALS:  Blood pressure (!) 162/72, pulse 64, temperature 98 F (36.7 C), temperature source Oral, resp. rate 15, height 5\' 3"  (1.6 m), weight 74.7 kg, SpO2 99 %.  PHYSICAL EXAMINATION:   GENERAL:  82 y.o.-year-old patient with no acute distress.  LUNGS: Normal breath sounds bilaterally, no wheezing CARDIOVASCULAR: S1, S2 normal. No murmur   ABDOMEN: Soft, nontender, nondistended. Bowel sounds present.  EXTREMITIES: No  edema b/l.    NEUROLOGIC: nonfocal  patient is alert and awake SKIN: No obvious rash, lesion, or ulcer.   LABORATORY PANEL:  CBC Recent Labs  Lab 12/13/22 0420  WBC 6.1  HGB 9.4*  HCT 30.5*  PLT 168    Chemistries  Recent Labs  Lab 12/08/22 0524 12/09/22 0529 12/13/22 0420  NA 144   < > 142  K 3.5   < > 3.9  CL 109   < > 104  CO2 24   < > 31  GLUCOSE 119*   < > 104*  BUN 63*   < > 19  CREATININE 6.97*   < > 4.30*  CALCIUM 8.6*   < > 8.8*  AST 14*  --   --   ALT 10  --   --   ALKPHOS 55  --   --   BILITOT 0.6  --   --    < > = values in this interval not displayed.   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  ESRD (end stage renal disease) (HCC) Third dialysis session on 6/10.  Had PermCath placed on 6/7.  Stable to go out to rehab once we have a bed and a outpatient dialysis slot.   Acute respiratory failure with hypoxia (HCC) Resolved.  Likely secondary to multifocal pneumonia and fluid overload.  Patient on room air for past couple days.   Acute on  chronic diastolic CHF (congestive heart failure) (HCC) Change Lasix to 40 mg daily on nondialysis days.  Patient on Coreg.   Multifocal pneumonia Completed antibiotics   Paroxysmal atrial fibrillation (HCC) On Coreg and Eliquis   HTN (hypertension) On amlodipine and Coreg   Type 2 diabetes mellitus with other specified complication (HCC) Last hemoglobin A1c 6.1.   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  Continue MiraLAX just in case constipation related.   Thrombocytopenia (HCC) Last platelet count 135.  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  Hepatitis viral load negative.  Patient likely had exposure to hepatitis B in the past and now immune.  Hepatitis C negative.   GERD (gastroesophageal reflux disease) On Protonix   Anemia of chronic disease Received 1 unit of packed red blood cells during the hospital course.  Last hemoglobin 9.3.   Malignant neoplasm of upper-outer quadrant of left breast in female, estrogen receptor positive (HCC) Anastrozole 1 mg daily resumed    Procedures: Family communication :dter [name] Consults : nephrology CODE STATUS: DNR DVT Prophylaxis : heparin Level of care: Med-Surg Status is: Inpatient Remains inpatient appropriate because:  patient medically best at baseline for discharge.  awating HD chair time. Patient will discharge to [facility] place once chair time confirmed.    TOTAL TIME TAKING CARE OF THIS PATIENT: 35 minutes.  >50% time spent on counselling and coordination of care  Note: This dictation was prepared with Dragon dictation along with smaller phrase technology. Any transcriptional errors that result from this process are unintentional.  Enedina Finner M.D    [facility]   CC: Primary care physician; Eustaquio Boyden, MD

## 2022-12-13 NOTE — TOC Progression Note (Signed)
Transition of Care [facility]) - Progression Note    Patient Details  Name: Alexis Tucker MRN: [id number] Date of Birth: December 30, 1940  Transition of Care [facility]) CM/SW Contact  Truddie Hidden, RN Phone Number: 12/13/2022, 10:23 AM  Clinical Narrative:    Sherron Monday with Maurine Minister dialysis coordinator to advised patient and family chose [facility]. Chair time for outpatient HD in the process of being coordinated.  Spoke with Marcelino Duster in admissions at [facility] to update on bed acceptance. She confirmed with the DON the diaylsis center used by their facility is [facility] on [address]. They are a MWF facility with morning times.  Marchelle Folks updated.      Barriers to Discharge: Continued Medical Work up  Expected Discharge Plan and Services       Living arrangements for the past 2 months: Single Family Home                           HH Arranged: PT HH Agency: [facility] Date HH Agency Contacted: 12/07/22 Time HH Agency Contacted: 1257 Representative spoke with at [facility] Agency: Barbara Cower   Social Determinants of Health (SDOH) Interventions SDOH Screenings   Food Insecurity: No Food Insecurity (12/05/2022)  Housing: Low Risk  (12/05/2022)  Transportation Needs: No Transportation Needs (12/05/2022)  Utilities: Not At Risk (12/05/2022)  Depression (PHQ2-9): Low Risk  (09/25/2022)  Financial Resource Strain: Low Risk  (12/28/2021)  Physical Activity: Inactive (12/28/2021)  Stress: No Stress Concern Present (12/28/2021)  Tobacco Use: Medium Risk (12/11/2022)    Readmission Risk Interventions    12/07/2022   12:55 PM  Readmission Risk Prevention Plan  Transportation Screening Complete  PCP or Specialist Appt within 3-5 Days Complete  HRI or Home Care Consult Complete  Social Work Consult for Recovery Care Planning/Counseling Complete  Palliative Care Screening Not Applicable  Medication Review Oceanographer) Complete

## 2022-12-13 NOTE — Progress Notes (Signed)
[facility]  ROUNDING NOTE   Subjective:   [name] was admitted to [facility] on 12/03/2022 for SOB (shortness of breath) [R06.02] Acute respiratory failure with hypoxia (HCC) [J96.01] Acute hypoxemic respiratory failure (HCC) [J96.01] Community acquired pneumonia of left upper lobe of lung [J18.9]  Patient seen and evaluated during hemodialysis   HEMODIALYSIS FLOWSHEET:  Blood Flow Rate (mL/min): 300 mL/min Arterial Pressure (mmHg): -140 mmHg Venous Pressure (mmHg): 110 mmHg TMP (mmHg): 8 mmHg Ultrafiltration Rate (mL/min): 267 mL/min Dialysate Flow Rate (mL/min): 300 ml/min Dialysis Fluid Bolus: Normal Saline  Tolerating treatment well, seated in chair No complaints to offer    Objective:  Vital signs in last 24 hours:  Temp:  [97.8 F (36.6 C)-99.6 F (37.6 C)] 97.9 F (36.6 C) (06/12 1253) Pulse Rate:  [64-86] 78 (06/12 1253) Resp:  [12-25] 14 (06/12 1253) BP: (117-184)/(51-104) 149/104 (06/12 1253) SpO2:  [94 %-100 %] 99 % (06/12 1253) Weight:  [74.7 kg] 74.7 kg (06/12 1144)  Weight change:  Filed Weights   12/11/22 1618 12/13/22 0815 12/13/22 1144  Weight: 76 kg 74.7 kg 74.7 kg    Intake/Output: I/O last 3 completed shifts: In: 840 [P.O.:840] Out: 152 [Urine:152]   Intake/Output this shift:  Total I/O In: -  Out: 1 [Urine:1]  Physical Exam: General: NAD  Head: Normocephalic, atraumatic. Moist oral mucosal membranes  Eyes: Anicteric  Lungs:  Clear to auscultation  Heart: Regular rate and rhythm  Abdomen:  Soft, nontender  Extremities:  trace peripheral edema.  Neurologic: Alert and oriented to self, moving all four extremities  Skin: No lesions  Access: AVG - no bruit or thrill, right chest PermCath placed on 12/08/2022    Basic Metabolic Panel: Recent Labs  Lab 12/07/22 0947 12/08/22 0524 12/09/22 0529 12/11/22 0510 12/13/22 0420  NA 140 144 140 140 142  K 3.9 3.5 3.6 3.4* 3.9  CL 107 109 102 103 104  CO2 18* 24  25 27 31   GLUCOSE 185* 119* 106* 111* 104*  BUN 64* 63* 42* 28* 19  CREATININE 7.13* 6.97* 5.18* 4.66* 4.30*  CALCIUM 8.8* 8.6* 8.6* 8.6* 8.8*     Liver Function Tests: Recent Labs  Lab 12/08/22 0524  AST 14*  ALT 10  ALKPHOS 55  BILITOT 0.6  PROT 6.6  ALBUMIN 3.2*    No results for input(s): "LIPASE", "AMYLASE" in the last 168 hours. No results for input(s): "AMMONIA" in the last 168 hours.  CBC: Recent Labs  Lab 12/08/22 0524 12/09/22 0529 12/10/22 0510 12/11/22 0510 12/13/22 0420  WBC 4.4 5.5  --   --  6.1  HGB 8.6* 10.3* 9.3* 9.9* 9.4*  HCT 26.9* 32.3*  --   --  30.5*  MCV 93.1 93.4  --   --  95.3  PLT 136* 135*  --   --  168     Cardiac Enzymes: No results for input(s): "CKTOTAL", "CKMB", "CKMBINDEX", "TROPONINI" in the last 168 hours.  BNP: Invalid input(s): "POCBNP"  CBG: Recent Labs  Lab 12/08/22 0857 12/08/22 1018 12/12/22 1600  GLUCAP 91 104* 151*     Microbiology: Results for orders placed or performed during the hospital encounter of 12/03/22  Resp panel by RT-PCR (RSV, Flu A&B, Covid) Anterior Nasal Swab     Status: None   Collection Time: 12/03/22  3:45 PM   Specimen: Anterior Nasal Swab  Result Value Ref Range Status   SARS Coronavirus 2 by RT PCR NEGATIVE NEGATIVE Final    Comment: (  NOTE) SARS-CoV-2 target nucleic acids are NOT DETECTED.  The SARS-CoV-2 RNA is generally detectable in upper respiratory specimens during the acute phase of infection. The lowest concentration of SARS-CoV-2 viral copies this assay can detect is 138 copies/mL. A negative result does not preclude SARS-Cov-2 infection and should not be used as the sole basis for treatment or other patient management decisions. A negative result may occur with  improper specimen collection/handling, submission of specimen other than nasopharyngeal swab, presence of viral mutation(s) within the areas targeted by this assay, and inadequate number of viral copies(<138  copies/mL). A negative result must be combined with clinical observations, patient history, and epidemiological information. The expected result is Negative.  Fact Sheet for Patients:  BloggerCourse.com  Fact Sheet for Healthcare Providers:  SeriousBroker.it  This test is no t yet approved or cleared by the [place] FDA and  has been authorized for detection and/or diagnosis of SARS-CoV-2 by FDA under an Emergency Use Authorization (EUA). This EUA will remain  in effect (meaning this test can be used) for the duration of the COVID-19 declaration under Section 564(b)(1) of the Act, 21 U.S.C.section 360bbb-3(b)(1), unless the authorization is terminated  or revoked sooner.       Influenza A by PCR NEGATIVE NEGATIVE Final   Influenza B by PCR NEGATIVE NEGATIVE Final    Comment: (NOTE) The Xpert Xpress SARS-CoV-2/FLU/RSV plus assay is intended as an aid in the diagnosis of influenza from Nasopharyngeal swab specimens and should not be used as a sole basis for treatment. Nasal washings and aspirates are unacceptable for Xpert Xpress SARS-CoV-2/FLU/RSV testing.  Fact Sheet for Patients: BloggerCourse.com  Fact Sheet for Healthcare Providers: SeriousBroker.it  This test is not yet approved or cleared by the [place] FDA and has been authorized for detection and/or diagnosis of SARS-CoV-2 by FDA under an Emergency Use Authorization (EUA). This EUA will remain in effect (meaning this test can be used) for the duration of the COVID-19 declaration under Section 564(b)(1) of the Act, 21 U.S.C. section 360bbb-3(b)(1), unless the authorization is terminated or revoked.     Resp Syncytial Virus by PCR NEGATIVE NEGATIVE Final    Comment: (NOTE) Fact Sheet for Patients: BloggerCourse.com  Fact Sheet for Healthcare  Providers: SeriousBroker.it  This test is not yet approved or cleared by the [place] FDA and has been authorized for detection and/or diagnosis of SARS-CoV-2 by FDA under an Emergency Use Authorization (EUA). This EUA will remain in effect (meaning this test can be used) for the duration of the COVID-19 declaration under Section 564(b)(1) of the Act, 21 U.S.C. section 360bbb-3(b)(1), unless the authorization is terminated or revoked.  Performed at [facility], [address] [place], [place] [address]   Blood Culture (routine x 2)     Status: None   Collection Time: 12/03/22  3:45 PM   Specimen: BLOOD  Result Value Ref Range Status   Specimen Description BLOOD BLOOD LEFT ARM  Final   Special Requests   Final    BOTTLES DRAWN AEROBIC AND ANAEROBIC Blood Culture adequate volume   Culture   Final    NO GROWTH 5 DAYS Performed at [facility], [address] [place], [place] [address]    Report Status 12/08/2022 FINAL  Final  Blood Culture (routine x 2)     Status: None   Collection Time: 12/03/22  4:30 PM   Specimen: BLOOD  Result Value Ref Range Status   Specimen Description BLOOD BLOOD LEFT ARM  Final  Special Requests   Final    BOTTLES DRAWN AEROBIC AND ANAEROBIC Blood Culture adequate volume   Culture   Final    NO GROWTH 5 DAYS Performed at [facility], [address] [place], [place] [address]    Report Status 12/08/2022 FINAL  Final    Coagulation Studies: No results for input(s): "LABPROT", "INR" in the last 72 hours.   Urinalysis: No results for input(s): "COLORURINE", "LABSPEC", "PHURINE", "GLUCOSEU", "HGBUR", "BILIRUBINUR", "KETONESUR", "PROTEINUR", "UROBILINOGEN", "NITRITE", "LEUKOCYTESUR" in the last 72 hours.  Invalid input(s): "APPERANCEUR"     Imaging: No results found.   Medications:    anticoagulant sodium citrate       allopurinol  100 mg Oral Daily   amLODipine  10 mg Oral  Daily   anastrozole  1 mg Oral Daily   apixaban  2.5 mg Oral BID   carvedilol  12.5 mg Oral BID WC   Chlorhexidine Gluconate Cloth  6 each Topical Q0600   feeding supplement (NEPRO CARB STEADY)  237 mL Oral TID BM   furosemide  40 mg Oral Once per day on Sun Tue Thu Sat   isosorbide mononitrate  30 mg Oral Daily   levothyroxine  88 mcg Oral QAC breakfast   linagliptin  5 mg Oral Daily   multivitamin  1 tablet Oral QHS   pantoprazole  40 mg Oral QHS   polyethylene glycol  17 g Oral Daily   sodium bicarbonate  1,300 mg Oral BID   acetaminophen, alteplase, anticoagulant sodium citrate, heparin, lidocaine (PF), lidocaine-prilocaine, oxyCODONE, pentafluoroprop-tetrafluoroeth, senna-docusate  Assessment/ Plan:  [name] is a 82 y.o.  female with breast cancer,  hypertension, hypothyroidism, GERD, atrial fibrillation, who was originally admitted to [facility] on 12/03/2022 for SOB (shortness of breath) [R06.02] Acute respiratory failure with hypoxia (HCC) [J96.01] Acute hypoxemic respiratory failure (HCC) [J96.01] Community acquired pneumonia of left upper lobe of lung [J18.9]  End stage renal disease requiring hemodialysis -Due for worsening creatinine and intermittent.  shortness of breath, we feel this is a progression of disease and time to initiate renal replacement therapy.  Patient and family agreeable to proceed. - Appreciate vascular surgery placing right chest PermCath on 12/08/2022. - Received treatment today seated in chair, no complaints.  -Next treatment scheduled for Friday. - Outpatient clinic search in progress  Hypertension with chronic kidney disease: continue carvedilol, isosorbide mononitrate and amlodipine.  Blood pressure 154/87 during dialysis  Anemia of chronic kidney disease: hemoglobin 9.4. at goal  Acute exacerbation of chronic congestive heart failure -Fluid management with furosemide and hemodialysis.    LOS: 10   6/12/20243:26 PM

## 2022-12-13 NOTE — Progress Notes (Signed)
PT Cancellation Note  Patient Details Name: Alexis Tucker MRN: [id number] DOB: 11-Aug-1940   Cancelled Treatment:     Pt is off floor in HD. Will return later this afternoon and continue to follow per current POC.    Rushie Chestnut 12/13/2022, 8:59 AM

## 2022-12-13 NOTE — Progress Notes (Addendum)
Hemodialysis note  Received patient in bed to unit. Alert and oriented x2.  Informed consent signed and in chart.  Treatment initiated: 0825 Treatment completed: 1128  Patient tolerated well. Transported back to room, alert without acute distress.  Report given to patient's RN.   Access used: Right Chest HD PermCath Access issues: none  Total UF removed: 0 Medication(s) given:  none  Post HD weight: 74.7 kg   [facility]

## 2022-12-13 NOTE — Progress Notes (Signed)
Physical Therapy Treatment Patient Details Name: Alexis Tucker MRN: [id number] DOB: 01-09-1941 Today's Date: 12/13/2022   History of Present Illness Patient is a 82 year old female presenting with concerns of labored breathing, short of breath, hypoxia. 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    PT Comments    Pt was supine in bed under 4 blankets upon arriving. She is alert however overall disoriented to situation/time. She required a lot of encouragement to participate ,but once agreeable, did cooperate and remained pleasant. She required a little assistance to exit bed, stand, and ambulate in room ~ 40 ft total with RW. Pt is limited by fatigue and somewhat self limiting. She will continue to benefit from skilled PT at DC to maximize independence and safety with all ADLs. DC recs remain appropriate    Recommendations for follow up therapy are one component of a multi-disciplinary discharge planning process, led by the attending physician.  Recommendations may be updated based on patient status, additional functional criteria and insurance authorization.     Assistance Recommended at Discharge Frequent or constant Supervision/Assistance  Patient can return home with the following A little help with walking and/or transfers;A little help with bathing/dressing/bathroom;Assistance with cooking/housework;Help with stairs or ramp for entrance;Assist for transportation   Equipment Recommendations  Other (comment) (defer to next level of care)       Precautions / Restrictions Precautions Precautions: Fall Precaution Comments: vision impaired Restrictions Weight Bearing Restrictions: No     Mobility  Bed Mobility Overal bed mobility: Needs Assistance Bed Mobility: Supine to Sit, Sit to Supine     Supine to sit: HOB elevated, Min assist Sit to  supine: Min assist        Transfers Overall transfer level: Needs assistance Equipment used: Rolling walker (2 wheels) Transfers: Sit to/from Stand Sit to Stand: Min assist                Ambulation/Gait Ambulation/Gait assistance: Min Chemical engineer (Feet): 45 Feet Assistive device: Rolling walker (2 wheels) Gait Pattern/deviations: Step-to pattern, Trunk flexed, Narrow base of support Gait velocity: decreased     General Gait Details: pt was only willing to ambulate in her oom. she ambulated to/from doorway and return 2 x prior to wanting to get back into bed from being cold.   Stairs             Wheelchair Mobility    Modified Rankin (Stroke Patients Only)       Balance Overall balance assessment: Needs assistance Sitting-balance support: Feet supported Sitting balance-Leahy Scale: Good     Standing balance support: Bilateral upper extremity supported, During functional activity, Single extremity supported, Reliant on assistive device for balance Standing balance-Leahy Scale: Fair                              Cognition Arousal/Alertness: Awake/alert Behavior During Therapy: Flat affect Overall Cognitive Status:  (Per daughter, Per discussion with daughter previous session. pt's cognition has been getting worse over past few months.)                                          Exercises      General Comments  Pertinent Vitals/Pain Pain Assessment Pain Assessment: No/denies pain Breathing: normal    Home Living                          Prior Function            PT Goals (current goals can now be found in the care plan section) Acute Rehab PT Goals Patient Stated Goal: none stated Progress towards PT goals: Progressing toward goals    Frequency    Min 3X/week      PT Plan Current plan remains appropriate    Co-evaluation              AM-PAC PT "6 Clicks" Mobility    Outcome Measure  Help needed turning from your back to your side while in a flat bed without using bedrails?: None Help needed moving from lying on your back to sitting on the side of a flat bed without using bedrails?: A Little Help needed moving to and from a bed to a chair (including a wheelchair)?: A Little Help needed standing up from a chair using your arms (e.g., wheelchair or bedside chair)?: A Little Help needed to walk in hospital room?: A Little Help needed climbing 3-5 steps with a railing? : A Lot 6 Click Score: 18    End of Session   Activity Tolerance: Patient tolerated treatment well;Patient limited by fatigue;Other (comment) (self limiting somewhat) Patient left: in bed;with call bell/phone within reach;with bed alarm set Nurse Communication: Mobility status PT Visit Diagnosis: Muscle weakness (generalized) (M62.81);Unsteadiness on feet (R26.81)     Time: 1610-9604 PT Time Calculation (min) (ACUTE ONLY): 19 min  Charges:  $Gait Training: 8-22 mins                    Jetta Lout PTA 12/13/22, 3:42 PM

## 2022-12-13 NOTE — Progress Notes (Signed)
Nutrition Education Note  RD consulted for Renal Education.   Provided "Nutrition for People on Dialysis" from [facility] Nutrition Care Manual; attached to AVS/ discharge summary.   Pt down in HD suite at time of visit. Per MD notes, pt is new to HD. Pt will also have support from renal RD at local HD center. RD will try to revisit pt as time allows.  Current diet order is renal, patient is consuming approximately 95-100% of meals at this time. Labs and medications reviewed. No further nutrition interventions warranted at this time. RD contact information provided. If additional nutrition issues arise, please re-consult RD.  Alexis Tucker, RD, LDN, CDCES Registered Dietitian II Certified Diabetes Care and Education Specialist Please refer to [facility] for RD and/or RD on-call/weekend/after hours pager

## 2022-12-13 NOTE — Plan of Care (Signed)

## 2022-12-13 NOTE — Progress Notes (Signed)
Attempted to call patients daughter [name] to discuss packet of information for dialysis information/education, diet, and any other concerns. Left voicemail for [name] to call me at her earliest convenience.  Left packet of information at patients bedside.   Jean Rosenthal Dialysis Nurse Coordinator [phone]

## 2022-12-14 DIAGNOSIS — N186 End stage renal disease: Secondary | ICD-10-CM | POA: Diagnosis not present

## 2022-12-14 NOTE — Progress Notes (Signed)
[facility]  ROUNDING NOTE   Subjective:   [name] was admitted to [facility] on 12/03/2022 for SOB (shortness of breath) [R06.02] Acute respiratory failure with hypoxia (HCC) [J96.01] Acute hypoxemic respiratory failure (HCC) [J96.01] Community acquired pneumonia of left upper lobe of lung [J18.9]  Patient seen sitting up in bed Completed breakfast  States she feels  good today  No family at bedside   Objective:  Vital signs in last 24 hours:  Temp:  [98.2 F (36.8 C)-99.1 F (37.3 C)] 98.6 F (37 C) (06/13 0805) Pulse Rate:  [72-78] 72 (06/13 0805) Resp:  [16-18] 18 (06/13 0805) BP: (125-138)/(61-126) 138/75 (06/13 0805) SpO2:  [96 %-100 %] 97 % (06/13 0805)  Weight change:  Filed Weights   12/11/22 1618 12/13/22 0815 12/13/22 1144  Weight: 76 kg 74.7 kg 74.7 kg    Intake/Output: I/O last 3 completed shifts: In: 720 [P.O.:720] Out: 3 [Urine:3]   Intake/Output this shift:  No intake/output data recorded.  Physical Exam: General: NAD  Head: Normocephalic, atraumatic. Moist oral mucosal membranes  Eyes: Anicteric  Lungs:  Clear to auscultation  Heart: Regular rate and rhythm  Abdomen:  Soft, nontender  Extremities:  No peripheral edema.  Neurologic: Alert and oriented to self, moving all four extremities  Skin: No lesions  Access: AVG - no bruit or thrill, right chest PermCath placed on 12/08/2022    Basic Metabolic Panel: Recent Labs  Lab 12/08/22 0524 12/09/22 0529 12/11/22 0510 12/13/22 0420  NA 144 140 140 142  K 3.5 3.6 3.4* 3.9  CL 109 102 103 104  CO2 24 25 27 31   GLUCOSE 119* 106* 111* 104*  BUN 63* 42* 28* 19  CREATININE 6.97* 5.18* 4.66* 4.30*  CALCIUM 8.6* 8.6* 8.6* 8.8*     Liver Function Tests: Recent Labs  Lab 12/08/22 0524  AST 14*  ALT 10  ALKPHOS 55  BILITOT 0.6  PROT 6.6  ALBUMIN 3.2*    No results for input(s): "LIPASE", "AMYLASE" in the last 168 hours. No results for input(s): "AMMONIA" in  the last 168 hours.  CBC: Recent Labs  Lab 12/08/22 0524 12/09/22 0529 12/10/22 0510 12/11/22 0510 12/13/22 0420  WBC 4.4 5.5  --   --  6.1  HGB 8.6* 10.3* 9.3* 9.9* 9.4*  HCT 26.9* 32.3*  --   --  30.5*  MCV 93.1 93.4  --   --  95.3  PLT 136* 135*  --   --  168     Cardiac Enzymes: No results for input(s): "CKTOTAL", "CKMB", "CKMBINDEX", "TROPONINI" in the last 168 hours.  BNP: Invalid input(s): "POCBNP"  CBG: Recent Labs  Lab 12/08/22 0857 12/08/22 1018 12/12/22 1600  GLUCAP 91 104* 151*     Microbiology: Results for orders placed or performed during the hospital encounter of 12/03/22  Resp panel by RT-PCR (RSV, Flu A&B, Covid) Anterior Nasal Swab     Status: None   Collection Time: 12/03/22  3:45 PM   Specimen: Anterior Nasal Swab  Result Value Ref Range Status   SARS Coronavirus 2 by RT PCR NEGATIVE NEGATIVE Final    Comment: (NOTE) SARS-CoV-2 target nucleic acids are NOT DETECTED.  The SARS-CoV-2 RNA is generally detectable in upper respiratory specimens during the acute phase of infection. The lowest concentration of SARS-CoV-2 viral copies this assay can detect is 138 copies/mL. A negative result does not preclude SARS-Cov-2 infection and should not be used as the sole basis for treatment or other patient  management decisions. A negative result may occur with  improper specimen collection/handling, submission of specimen other than nasopharyngeal swab, presence of viral mutation(s) within the areas targeted by this assay, and inadequate number of viral copies(<138 copies/mL). A negative result must be combined with clinical observations, patient history, and epidemiological information. The expected result is Negative.  Fact Sheet for Patients:  BloggerCourse.com  Fact Sheet for Healthcare Providers:  SeriousBroker.it  This test is no t yet approved or cleared by the [place] FDA and  has been  authorized for detection and/or diagnosis of SARS-CoV-2 by FDA under an Emergency Use Authorization (EUA). This EUA will remain  in effect (meaning this test can be used) for the duration of the COVID-19 declaration under Section 564(b)(1) of the Act, 21 U.S.C.section 360bbb-3(b)(1), unless the authorization is terminated  or revoked sooner.       Influenza A by PCR NEGATIVE NEGATIVE Final   Influenza B by PCR NEGATIVE NEGATIVE Final    Comment: (NOTE) The Xpert Xpress SARS-CoV-2/FLU/RSV plus assay is intended as an aid in the diagnosis of influenza from Nasopharyngeal swab specimens and should not be used as a sole basis for treatment. Nasal washings and aspirates are unacceptable for Xpert Xpress SARS-CoV-2/FLU/RSV testing.  Fact Sheet for Patients: BloggerCourse.com  Fact Sheet for Healthcare Providers: SeriousBroker.it  This test is not yet approved or cleared by the [place] FDA and has been authorized for detection and/or diagnosis of SARS-CoV-2 by FDA under an Emergency Use Authorization (EUA). This EUA will remain in effect (meaning this test can be used) for the duration of the COVID-19 declaration under Section 564(b)(1) of the Act, 21 U.S.C. section 360bbb-3(b)(1), unless the authorization is terminated or revoked.     Resp Syncytial Virus by PCR NEGATIVE NEGATIVE Final    Comment: (NOTE) Fact Sheet for Patients: BloggerCourse.com  Fact Sheet for Healthcare Providers: SeriousBroker.it  This test is not yet approved or cleared by the [place] FDA and has been authorized for detection and/or diagnosis of SARS-CoV-2 by FDA under an Emergency Use Authorization (EUA). This EUA will remain in effect (meaning this test can be used) for the duration of the COVID-19 declaration under Section 564(b)(1) of the Act, 21 U.S.C. section 360bbb-3(b)(1), unless the  authorization is terminated or revoked.  Performed at [facility], [address] [place], [place] [address]   Blood Culture (routine x 2)     Status: None   Collection Time: 12/03/22  3:45 PM   Specimen: BLOOD  Result Value Ref Range Status   Specimen Description BLOOD BLOOD LEFT ARM  Final   Special Requests   Final    BOTTLES DRAWN AEROBIC AND ANAEROBIC Blood Culture adequate volume   Culture   Final    NO GROWTH 5 DAYS Performed at [facility], [address], [place], [place] [address]    Report Status 12/08/2022 FINAL  Final  Blood Culture (routine x 2)     Status: None   Collection Time: 12/03/22  4:30 PM   Specimen: BLOOD  Result Value Ref Range Status   Specimen Description BLOOD BLOOD LEFT ARM  Final   Special Requests   Final    BOTTLES DRAWN AEROBIC AND ANAEROBIC Blood Culture adequate volume   Culture   Final    NO GROWTH 5 DAYS Performed at [facility], [address] [place], [place] [address]    Report Status 12/08/2022 FINAL  Final    Coagulation Studies: No results for  input(s): "LABPROT", "INR" in the last 72 hours.   Urinalysis: No results for input(s): "COLORURINE", "LABSPEC", "PHURINE", "GLUCOSEU", "HGBUR", "BILIRUBINUR", "KETONESUR", "PROTEINUR", "UROBILINOGEN", "NITRITE", "LEUKOCYTESUR" in the last 72 hours.  Invalid input(s): "APPERANCEUR"     Imaging: No results found.   Medications:    anticoagulant sodium citrate       allopurinol  100 mg Oral Daily   amLODipine  10 mg Oral Daily   anastrozole  1 mg Oral Daily   apixaban  2.5 mg Oral BID   carvedilol  12.5 mg Oral BID WC   Chlorhexidine Gluconate Cloth  6 each Topical Q0600   feeding supplement (NEPRO CARB STEADY)  237 mL Oral TID BM   furosemide  40 mg Oral Once per day on Sun Tue Thu Sat   isosorbide mononitrate  30 mg Oral Daily   levothyroxine  88 mcg Oral QAC breakfast   linagliptin  5 mg Oral Daily   multivitamin  1 tablet Oral QHS    pantoprazole  40 mg Oral QHS   polyethylene glycol  17 g Oral Daily   sodium bicarbonate  1,300 mg Oral BID   acetaminophen, alteplase, anticoagulant sodium citrate, heparin, lidocaine (PF), lidocaine-prilocaine, oxyCODONE, pentafluoroprop-tetrafluoroeth, senna-docusate  Assessment/ Plan:  [name] is a 82 y.o.  female with breast cancer,  hypertension, hypothyroidism, GERD, atrial fibrillation, who was originally admitted to [facility] on 12/03/2022 for SOB (shortness of breath) [R06.02] Acute respiratory failure with hypoxia (HCC) [J96.01] Acute hypoxemic respiratory failure (HCC) [J96.01] Community acquired pneumonia of left upper lobe of lung [J18.9]  End stage renal disease requiring hemodialysis -Due for worsening creatinine and intermittent.  shortness of breath, we feel this is a progression of disease and time to initiate renal replacement therapy.  Patient and family agreeable to proceed. - Appreciate vascular surgery placing right chest PermCath on 12/08/2022. - Next treatment scheduled for Friday - Appreciate renal navigator confirming outpatient dialysis clinic at [facility] on a MWF schedule. Patient cleared to begin treatment on Monday.  Hypertension with chronic kidney disease: continue carvedilol, isosorbide mononitrate and amlodipine.  Blood pressure 154/87 during dialysis  Anemia of chronic kidney disease: Will continue to monitor and access need for ESA  Acute exacerbation of chronic congestive heart failure -Fluid management with furosemide on non-dialysis days and hemodialysis.    LOS: 11   6/13/202412:54 PM

## 2022-12-14 NOTE — Progress Notes (Signed)
Physical Therapy Treatment Patient Details Name: Alexis Tucker MRN: [id number] DOB: 1941/06/16 Today's Date: 12/14/2022   History of Present Illness Patient is a 82 year old female presenting with concerns of labored breathing, short of breath, hypoxia. 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    PT Comments    Pt received in bed, agreed to PT session, consisting of transfers and gait training with RW. No dizziness with positional changes, however pt did feel weak while ambulating 32ft and therefore, close CGA provided in case of buckling. Pt cooperative, delayed initiation, with some memory issues, however continues to progress functionally.     Recommendations for follow up therapy are one component of a multi-disciplinary discharge planning process, led by the attending physician.  Recommendations may be updated based on patient status, additional functional criteria and insurance authorization.  Follow Up Recommendations  Can patient physically be transported by private vehicle: Yes    Assistance Recommended at Discharge Frequent or constant Supervision/Assistance  Patient can return home with the following A little help with walking and/or transfers;A little help with bathing/dressing/bathroom;Assistance with cooking/housework;Help with stairs or ramp for entrance;Assist for transportation   Equipment Recommendations  Other (comment) (defer to next level of care)    Recommendations for Other Services       Precautions / Restrictions Precautions Precautions: Fall Precaution Comments: vision impaired Restrictions Weight Bearing Restrictions: No     Mobility  Bed Mobility Overal bed mobility: Needs Assistance Bed Mobility: Supine to Sit     Supine to sit: HOB elevated, Min assist     General bed mobility comments:  (Able to  scoot to EOB with Supervision)    Transfers Overall transfer level: Needs assistance Equipment used: Rolling walker (2 wheels) Transfers: Sit to/from Stand Sit to Stand: Min assist           General transfer comment: VC for sequencing 2/2 low vision    Ambulation/Gait Ambulation/Gait assistance: Min guard Gait Distance (Feet): 60 Feet Assistive device: Rolling walker (2 wheels) Gait Pattern/deviations: Step-through pattern, Drifts right/left, Narrow base of support Gait velocity: decreased     General Gait Details: Pt more cooperative, increased gait distance. No buckling.   Stairs             Wheelchair Mobility    Modified Rankin (Stroke Patients Only)       Balance Overall balance assessment: Needs assistance Sitting-balance support: Feet supported Sitting balance-Leahy Scale: Good     Standing balance support: Bilateral upper extremity supported, During functional activity, Single extremity supported, Reliant on assistive device for balance Standing balance-Leahy Scale: Fair                              Cognition Arousal/Alertness: Awake/alert Behavior During Therapy: Flat affect Overall Cognitive Status: No family/caregiver present to determine baseline cognitive functioning                                 General Comments: pt follows commands well, pleasant, ? memory deficits.        Exercises General Exercises - Lower Extremity Ankle Circles/Pumps: AROM, Both, 10 reps, Seated Long Arc Quad: AROM, Both, 10 reps, Seated Hip Flexion/Marching: AROM, Both, 10 reps, Seated    General Comments  Pertinent Vitals/Pain Pain Assessment Pain Assessment: No/denies pain    Home Living                          Prior Function            PT Goals (current goals can now be found in the care plan section) Acute Rehab PT Goals Patient Stated Goal: none stated Progress towards PT goals: Progressing  toward goals    Frequency    Min 3X/week      PT Plan Current plan remains appropriate    Co-evaluation              AM-PAC PT "6 Clicks" Mobility   Outcome Measure  Help needed turning from your back to your side while in a flat bed without using bedrails?: None Help needed moving from lying on your back to sitting on the side of a flat bed without using bedrails?: A Little Help needed moving to and from a bed to a chair (including a wheelchair)?: A Little Help needed standing up from a chair using your arms (e.g., wheelchair or bedside chair)?: A Little Help needed to walk in hospital room?: A Little Help needed climbing 3-5 steps with a railing? : A Lot 6 Click Score: 18    End of Session Equipment Utilized During Treatment: Gait belt Activity Tolerance: Patient tolerated treatment well Patient left: in chair;with call bell/phone within reach Nurse Communication: Mobility status PT Visit Diagnosis: Muscle weakness (generalized) (M62.81);Unsteadiness on feet (R26.81)     Time: 1610-9604 PT Time Calculation (min) (ACUTE ONLY): 28 min  Charges:  $Gait Training: 8-22 mins $Therapeutic Exercise: 8-22 mins                    Zadie Cleverly, PTA  Jannet Askew 12/14/2022, 11:07 AM

## 2022-12-14 NOTE — Discharge Planning (Signed)
PLACEMENT RESOLVED: Outpatient Facility  [facility]  [address]  [place] [address] [phone]  Scheduled days: Monday Wednesday and Friday  Treatment time: 11:35am arrival for a 11:55am chair time Strat date: Monday 6/17 @ 11:35am  Called Daughter [name] to make aware of chair time. [name] is aware that she will need to be present before patient's first treatment to fill out paperwork. [name] is aware to bring necessary documents to show HPOA.  Per Nephrology patient tolerated sitting in recliner for treatment yesterday.

## 2022-12-14 NOTE — Progress Notes (Signed)
[facility]  - [place] [facility]   PATIENT NAME: Alexis Tucker    MR#:  [id number]  DATE OF BIRTH:  May 15, 1941  SUBJECTIVE:  no family at bedside. Patient overall doing well. No issues per RN.  VITALS:  Blood pressure 138/75, pulse 72, temperature 98.6 F (37 C), resp. rate 18, height 5\' 3"  (1.6 m), weight 74.7 kg, SpO2 97 %.  PHYSICAL EXAMINATION:   GENERAL:  82 y.o.-year-old patient with no acute distress.  LUNGS: Normal breath sounds bilaterally, no wheezing CARDIOVASCULAR: S1, S2 normal. No murmur   ABDOMEN: Soft, nontender, nondistended. Bowel sounds present.  EXTREMITIES: No  edema b/l.   HD access+ NEUROLOGIC: nonfocal  patient is alert and awake   LABORATORY PANEL:  CBC Recent Labs  Lab 12/13/22 0420  WBC 6.1  HGB 9.4*  HCT 30.5*  PLT 168     Chemistries  Recent Labs  Lab 12/08/22 0524 12/09/22 0529 12/13/22 0420  NA 144   < > 142  K 3.5   < > 3.9  CL 109   < > 104  CO2 24   < > 31  GLUCOSE 119*   < > 104*  BUN 63*   < > 19  CREATININE 6.97*   < > 4.30*  CALCIUM 8.6*   < > 8.8*  AST 14*  --   --   ALT 10  --   --   ALKPHOS 55  --   --   BILITOT 0.6  --   --    < > = values in this interval not displayed.    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  ESRD (end stage renal disease) (HCC) Third dialysis session on 6/10.  Had PermCath placed on 6/7.  Stable to go out to rehab once we have a bed and a outpatient dialysis slot.   Acute respiratory failure with hypoxia (HCC) Resolved.  Likely secondary to multifocal pneumonia and fluid overload.  Patient on room air for past couple days.   Acute on chronic diastolic CHF (congestive heart failure) (HCC) Change  Lasix to 40 mg daily on nondialysis days.  Patient on Coreg.   Multifocal pneumonia Completed antibiotics   Paroxysmal atrial fibrillation (HCC) On Coreg and Eliquis   HTN (hypertension) On amlodipine and Coreg   Type 2 diabetes mellitus with other specified complication (HCC) Last hemoglobin A1c 6.1.   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  Continue MiraLAX just in case constipation related.   Thrombocytopenia (HCC) Last platelet count 135.  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  Hepatitis viral load negative.  Patient likely had exposure to hepatitis B in the past and now immune.  Hepatitis C negative.   GERD (gastroesophageal reflux disease) On Protonix   Anemia of chronic disease Received 1 unit of packed red blood cells during the hospital course.  Last hemoglobin 9.3.   Malignant neoplasm of upper-outer quadrant of left breast in female, estrogen receptor positive (HCC) Anastrozole 1 mg daily resumed    Procedures: Family communication :dter [name] Consults : nephrology CODE STATUS: DNR DVT Prophylaxis : heparin Level of care: Med-Surg Status is: Inpatient Remains inpatient appropriate because: patient medically best at baseline for discharge.  awating HD chair time. Patient will discharge to [facility] place once chair time confirmed.    TOTAL TIME TAKING CARE OF THIS PATIENT: 35 minutes.  >50% time spent on counselling and coordination of care  Note: This dictation was prepared with Dragon dictation along with smaller phrase technology. Any transcriptional errors that result from this process are unintentional.  Enedina Finner M.D    [facility]   CC: Primary care physician; Eustaquio Boyden, MD

## 2022-12-14 NOTE — TOC Progression Note (Signed)
Transition of Care [facility]) - Progression Note    Patient Details  Name: Alexis Tucker MRN: [id number] Date of Birth: 16-Jul-1940  Transition of Care [facility]) CM/SW Contact  Allena Katz, LCSW Phone Number: 12/14/2022, 1:34 PM  Clinical Narrative:  Dialysis confirmed. Per dialysis coordinator pt can skip tomorrows dialysis and go to [facility]. Marcelino Duster with [facility] notified.     Barriers to Discharge: Continued Medical Work up  Expected Discharge Plan and Services       Living arrangements for the past 2 months: Single Family Home                           HH Arranged: PT HH Agency: [facility] Date HH Agency Contacted: 12/07/22 Time HH Agency Contacted: [phone] Representative spoke with at [facility] Agency: Barbara Cower   Social Determinants of Health (SDOH) Interventions SDOH Screenings   Food Insecurity: No Food Insecurity (12/05/2022)  Housing: Low Risk  (12/05/2022)  Transportation Needs: No Transportation Needs (12/05/2022)  Utilities: Not At Risk (12/05/2022)  Depression (PHQ2-9): Low Risk  (09/25/2022)  Financial Resource Strain: Low Risk  (12/28/2021)  Physical Activity: Inactive (12/28/2021)  Stress: No Stress Concern Present (12/28/2021)  Tobacco Use: Medium Risk (12/11/2022)    Readmission Risk Interventions    12/07/2022   12:55 PM  Readmission Risk Prevention Plan  Transportation Screening Complete  PCP or Specialist Appt within 3-5 Days Complete  HRI or Home Care Consult Complete  Social Work Consult for Recovery Care Planning/Counseling Complete  Palliative Care Screening Not Applicable  Medication Review Oceanographer) Complete

## 2022-12-14 NOTE — Progress Notes (Signed)
Occupational Therapy Treatment Patient Details Name: Alexis Tucker MRN: [id number] DOB: 1941-03-23 Today's Date: 12/14/2022   History of present illness Patient is a 82 year old female presenting with concerns of labored breathing, short of breath, hypoxia. 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   OT comments  Pt seen for OT tx. Pt initially required MIN A to stand from recliner, improving to CGA after implementing instruction in hand placement and shifting weight forward first. Pt required CGA + RW + MOD VC for sequencing 2/2 low vision to walk around the EOB to the [facility]. Set up and CGA in standing to complete pericare this date. MODA  for donning clean socks. Pt progressing towards goals. Continue with OT POC.    Recommendations for follow up therapy are one component of a multi-disciplinary discharge planning process, led by the attending physician.  Recommendations may be updated based on patient status, additional functional criteria and insurance authorization.    Assistance Recommended at Discharge Frequent or constant Supervision/Assistance  Patient can return home with the following  A little help with walking and/or transfers;A little help with bathing/dressing/bathroom;Assistance with cooking/housework;Assist for transportation;Help with stairs or ramp for entrance;Direct supervision/assist for financial management;Direct supervision/assist for medications management   Equipment Recommendations  Other (comment) (defer to next venue)    Recommendations for Other Services      Precautions / Restrictions Precautions Precautions: Fall Precaution Comments: vision impaired Restrictions Weight Bearing Restrictions: No       Mobility Bed Mobility               General bed mobility comments: NT in recliner    Transfers Overall  transfer level: Needs assistance Equipment used: Rolling walker (2 wheels) Transfers: Sit to/from Stand Sit to Stand: Min assist, Min guard           General transfer comment: MIN A for initial STS, improving to CGA with cues for hand placement     Balance Overall balance assessment: Needs assistance Sitting-balance support: Feet supported Sitting balance-Leahy Scale: Good     Standing balance support: Single extremity supported, During functional activity, Reliant on assistive device for balance Standing balance-Leahy Scale: Fair                             ADL either performed or assessed with clinical judgement   ADL Overall ADL's : Needs assistance/impaired     Grooming: Sitting;Set up;Wash/dry hands               Lower Body Dressing: Sitting/lateral leans;Moderate assistance Lower Body Dressing Details (indicate cue type and reason): able to doff socks but required MOD A to don Toilet Transfer: BSC/3in1;Rolling walker (2 wheels);Min guard Toilet Transfer Details (indicate cue type and reason): VC for sequencing 2/2 low vision Toileting- Clothing Manipulation and Hygiene: Sit to/from stand;Min guard;Set up Toileting - Clothing Manipulation Details (indicate cue type and reason): CGA and set up in standing     Functional mobility during ADLs: Cueing for safety;Cueing for sequencing;Rolling walker (2 wheels);Min guard      Extremity/Trunk Assessment              Vision       Perception     Praxis      Cognition Arousal/Alertness: Awake/alert Behavior During Therapy: Flat affect Overall Cognitive Status: No family/caregiver present to determine  baseline cognitive functioning                                          Exercises      Shoulder Instructions       General Comments      Pertinent Vitals/ Pain       Pain Assessment Pain Assessment: No/denies pain  Home Living                                           Prior Functioning/Environment              Frequency  Min 2X/week        Progress Toward Goals  OT Goals(current goals can now be found in the care plan section)  Progress towards OT goals: Progressing toward goals  Acute Rehab OT Goals Patient Stated Goal: get stronger and return to PLOF OT Goal Formulation: With patient Time For Goal Achievement: 12/23/22 Potential to Achieve Goals: Fair  Plan Discharge plan remains appropriate;Frequency remains appropriate    Co-evaluation                 AM-PAC OT "6 Clicks" Daily Activity     Outcome Measure   Help from another person eating meals?: None Help from another person taking care of personal grooming?: A Little Help from another person toileting, which includes using toliet, bedpan, or urinal?: A Little Help from another person bathing (including washing, rinsing, drying)?: A Little Help from another person to put on and taking off regular upper body clothing?: A Little Help from another person to put on and taking off regular lower body clothing?: A Little 6 Click Score: 19    End of Session Equipment Utilized During Treatment: Rolling walker (2 wheels)  OT Visit Diagnosis: Unsteadiness on feet (R26.81);Muscle weakness (generalized) (M62.81);Repeated falls (R29.6)   Activity Tolerance Patient tolerated treatment well   Patient Left in chair;with call bell/phone within reach;with chair alarm set   Nurse Communication  (pt requesting chocolate nepro shake)        Time: 1610-9604 OT Time Calculation (min): 15 min  Charges: OT General Charges $OT Visit: 1 Visit OT Treatments $Self Care/Home Management : 8-22 mins  Arman Filter., MPH, MS, OTR/L ascom [phone] 12/14/22, 1:38 PM

## 2022-12-14 NOTE — Care Management Important Message (Signed)
Important Message  Patient Details  Name: Alexis Tucker MRN: [id number] Date of Birth: 1940-11-03   Medicare Important Message Given:  Yes     Olegario Messier A Vianney Kopecky 12/14/2022, 10:46 AM

## 2022-12-15 DIAGNOSIS — W19XXXA Unspecified fall, initial encounter: Secondary | ICD-10-CM | POA: Diagnosis not present

## 2022-12-15 DIAGNOSIS — R2689 Other abnormalities of gait and mobility: Secondary | ICD-10-CM | POA: Diagnosis not present

## 2022-12-15 DIAGNOSIS — H548 Legal blindness, as defined in USA: Secondary | ICD-10-CM | POA: Diagnosis not present

## 2022-12-15 DIAGNOSIS — S0990XA Unspecified injury of head, initial encounter: Secondary | ICD-10-CM | POA: Diagnosis not present

## 2022-12-15 DIAGNOSIS — E1169 Type 2 diabetes mellitus with other specified complication: Secondary | ICD-10-CM | POA: Diagnosis not present

## 2022-12-15 DIAGNOSIS — N185 Chronic kidney disease, stage 5: Secondary | ICD-10-CM | POA: Diagnosis not present

## 2022-12-15 DIAGNOSIS — N184 Chronic kidney disease, stage 4 (severe): Secondary | ICD-10-CM | POA: Diagnosis not present

## 2022-12-15 DIAGNOSIS — Z743 Need for continuous supervision: Secondary | ICD-10-CM | POA: Diagnosis not present

## 2022-12-15 DIAGNOSIS — E039 Hypothyroidism, unspecified: Secondary | ICD-10-CM | POA: Diagnosis not present

## 2022-12-15 DIAGNOSIS — M1990 Unspecified osteoarthritis, unspecified site: Secondary | ICD-10-CM | POA: Diagnosis not present

## 2022-12-15 DIAGNOSIS — S0003XA Contusion of scalp, initial encounter: Secondary | ICD-10-CM | POA: Diagnosis not present

## 2022-12-15 DIAGNOSIS — N2581 Secondary hyperparathyroidism of renal origin: Secondary | ICD-10-CM | POA: Diagnosis not present

## 2022-12-15 DIAGNOSIS — W1830XA Fall on same level, unspecified, initial encounter: Secondary | ICD-10-CM | POA: Diagnosis not present

## 2022-12-15 DIAGNOSIS — E1122 Type 2 diabetes mellitus with diabetic chronic kidney disease: Secondary | ICD-10-CM | POA: Diagnosis not present

## 2022-12-15 DIAGNOSIS — N186 End stage renal disease: Secondary | ICD-10-CM | POA: Diagnosis not present

## 2022-12-15 DIAGNOSIS — I480 Paroxysmal atrial fibrillation: Secondary | ICD-10-CM | POA: Diagnosis not present

## 2022-12-15 DIAGNOSIS — K219 Gastro-esophageal reflux disease without esophagitis: Secondary | ICD-10-CM | POA: Diagnosis not present

## 2022-12-15 DIAGNOSIS — I10 Essential (primary) hypertension: Secondary | ICD-10-CM | POA: Diagnosis not present

## 2022-12-15 DIAGNOSIS — R1312 Dysphagia, oropharyngeal phase: Secondary | ICD-10-CM | POA: Diagnosis not present

## 2022-12-15 DIAGNOSIS — F32A Depression, unspecified: Secondary | ICD-10-CM | POA: Diagnosis not present

## 2022-12-15 DIAGNOSIS — Z0000 Encounter for general adult medical examination without abnormal findings: Secondary | ICD-10-CM | POA: Diagnosis not present

## 2022-12-15 DIAGNOSIS — Z992 Dependence on renal dialysis: Secondary | ICD-10-CM | POA: Diagnosis not present

## 2022-12-15 DIAGNOSIS — I6782 Cerebral ischemia: Secondary | ICD-10-CM | POA: Diagnosis not present

## 2022-12-15 DIAGNOSIS — J9601 Acute respiratory failure with hypoxia: Secondary | ICD-10-CM | POA: Diagnosis not present

## 2022-12-15 DIAGNOSIS — R4182 Altered mental status, unspecified: Secondary | ICD-10-CM | POA: Diagnosis not present

## 2022-12-15 DIAGNOSIS — M6281 Muscle weakness (generalized): Secondary | ICD-10-CM | POA: Diagnosis not present

## 2022-12-15 DIAGNOSIS — S199XXA Unspecified injury of neck, initial encounter: Secondary | ICD-10-CM | POA: Diagnosis not present

## 2022-12-15 DIAGNOSIS — D509 Iron deficiency anemia, unspecified: Secondary | ICD-10-CM | POA: Diagnosis not present

## 2022-12-15 DIAGNOSIS — J189 Pneumonia, unspecified organism: Secondary | ICD-10-CM | POA: Diagnosis not present

## 2022-12-15 DIAGNOSIS — R296 Repeated falls: Secondary | ICD-10-CM | POA: Diagnosis not present

## 2022-12-15 DIAGNOSIS — R456 Violent behavior: Secondary | ICD-10-CM | POA: Diagnosis not present

## 2022-12-15 DIAGNOSIS — R079 Chest pain, unspecified: Secondary | ICD-10-CM | POA: Diagnosis not present

## 2022-12-15 DIAGNOSIS — R41841 Cognitive communication deficit: Secondary | ICD-10-CM | POA: Diagnosis not present

## 2022-12-15 DIAGNOSIS — I4891 Unspecified atrial fibrillation: Secondary | ICD-10-CM | POA: Diagnosis not present

## 2022-12-15 DIAGNOSIS — E559 Vitamin D deficiency, unspecified: Secondary | ICD-10-CM | POA: Diagnosis not present

## 2022-12-15 DIAGNOSIS — M109 Gout, unspecified: Secondary | ICD-10-CM | POA: Diagnosis not present

## 2022-12-15 DIAGNOSIS — M150 Primary generalized (osteo)arthritis: Secondary | ICD-10-CM | POA: Diagnosis not present

## 2022-12-15 DIAGNOSIS — I5032 Chronic diastolic (congestive) heart failure: Secondary | ICD-10-CM | POA: Diagnosis not present

## 2022-12-15 DIAGNOSIS — M6259 Muscle wasting and atrophy, not elsewhere classified, multiple sites: Secondary | ICD-10-CM | POA: Diagnosis not present

## 2022-12-15 DIAGNOSIS — R0789 Other chest pain: Secondary | ICD-10-CM | POA: Diagnosis not present

## 2022-12-15 DIAGNOSIS — I959 Hypotension, unspecified: Secondary | ICD-10-CM | POA: Diagnosis not present

## 2022-12-15 DIAGNOSIS — Z741 Need for assistance with personal care: Secondary | ICD-10-CM | POA: Diagnosis not present

## 2022-12-15 DIAGNOSIS — I700 Atherosclerosis of aorta: Secondary | ICD-10-CM | POA: Diagnosis not present

## 2022-12-15 DIAGNOSIS — F0390 Unspecified dementia without behavioral disturbance: Secondary | ICD-10-CM | POA: Diagnosis not present

## 2022-12-15 DIAGNOSIS — I2510 Atherosclerotic heart disease of native coronary artery without angina pectoris: Secondary | ICD-10-CM | POA: Diagnosis not present

## 2022-12-15 DIAGNOSIS — R457 State of emotional shock and stress, unspecified: Secondary | ICD-10-CM | POA: Diagnosis not present

## 2022-12-15 DIAGNOSIS — Z043 Encounter for examination and observation following other accident: Secondary | ICD-10-CM | POA: Diagnosis not present

## 2022-12-15 DIAGNOSIS — Z8673 Personal history of transient ischemic attack (TIA), and cerebral infarction without residual deficits: Secondary | ICD-10-CM | POA: Diagnosis not present

## 2022-12-15 DIAGNOSIS — N189 Chronic kidney disease, unspecified: Secondary | ICD-10-CM | POA: Diagnosis not present

## 2022-12-15 LAB — CBC
HCT: 28.5 % — ABNORMAL LOW (ref 36.0–46.0)
Hemoglobin: 8.9 g/dL — ABNORMAL LOW (ref 12.0–15.0)
MCH: 30.1 pg (ref 26.0–34.0)
MCHC: 31.2 g/dL (ref 30.0–36.0)
MCV: 96.3 fL (ref 80.0–100.0)
Platelets: 162 10*3/uL (ref 150–400)
RBC: 2.96 MIL/uL — ABNORMAL LOW (ref 3.87–5.11)
RDW: 14.3 % (ref 11.5–15.5)
WBC: 7.2 10*3/uL (ref 4.0–10.5)
nRBC: 0 % (ref 0.0–0.2)

## 2022-12-15 LAB — RENAL FUNCTION PANEL
Albumin: 3.1 g/dL — ABNORMAL LOW (ref 3.5–5.0)
Anion gap: 9 (ref 5–15)
BUN: 25 mg/dL — ABNORMAL HIGH (ref 8–23)
CO2: 31 mmol/L (ref 22–32)
Calcium: 9.3 mg/dL (ref 8.9–10.3)
Chloride: 101 mmol/L (ref 98–111)
Creatinine, Ser: 4.65 mg/dL — ABNORMAL HIGH (ref 0.44–1.00)
GFR, Estimated: 9 mL/min — ABNORMAL LOW (ref >60)
Glucose, Bld: 120 mg/dL — ABNORMAL HIGH (ref 70–99)
Phosphorus: 4 mg/dL (ref 2.5–4.6)
Potassium: 3.7 mmol/L (ref 3.5–5.1)
Sodium: 141 mmol/L (ref 135–145)

## 2022-12-15 MED ORDER — HEPARIN SODIUM (PORCINE) 1000 UNIT/ML IJ SOLN
INTRAMUSCULAR | Status: AC
  Filled 2022-12-15: qty 10

## 2022-12-15 MED ORDER — APIXABAN 2.5 MG PO TABS: 2.5000 mg | ORAL_TABLET | Freq: Two times a day (BID) | ORAL | Status: DC

## 2022-12-15 NOTE — Progress Notes (Signed)
PT Cancellation Note  Patient Details Name: Alexis Tucker MRN: [id number] DOB: Sep 06, 1940   Cancelled Treatment:     PT attempt. Pt is currently off the floor. Will return at a later time/date and continue to follow per current POC.    Rushie Chestnut 12/15/2022, 7:58 AM

## 2022-12-15 NOTE — TOC Transition Note (Addendum)
Transition of Care [facility]) - CM/SW Discharge Note   Patient Details  Name: Alexis Tucker MRN: [id number] Date of Birth: January 02, 1941  Transition of Care [facility]) CM/SW Contact:  Allena Katz, LCSW Phone Number: 12/15/2022, 1:05 PM   Clinical Narrative:   Pt has orders to discharge to [facility] and rehab. CSW spoke with Marcelino Duster at [facility] who is good to take patient today. RN given number for report. DC summary to be sent once in. Medical necessity printed to unit. DNR on chart. RN to call family once transport has been called.  CSW attempted to reach family but was unsuccessful. RN also unable to reach.  Final next level of care: Skilled Nursing Facility Barriers to Discharge: Barriers Resolved   Patient Goals and CMS Choice CMS Medicare.gov Compare Post Acute Care list provided to:: Patient Choice offered to / list presented to : Patient  Discharge Placement PASRR number recieved: 12/14/22 PASRR number recieved: 12/14/22            [facility]) Patient to be transferred to facility by: [facility] Name of family member notified: RN to notify family Patient and family notified of of transfer: 12/15/22  Discharge Plan and Services Additional resources added to the After Visit Summary for                            [facility] Arranged: PT [facility] Agency: [facility] Date HH Agency Contacted: 12/07/22 Time HH Agency Contacted: [phone] Representative spoke with at [facility] Agency: Barbara Cower  Social Determinants of Health (SDOH) Interventions SDOH Screenings   Food Insecurity: No Food Insecurity (12/05/2022)  Housing: Low Risk  (12/05/2022)  Transportation Needs: No Transportation Needs (12/05/2022)  Utilities: Not At Risk (12/05/2022)  Depression (PHQ2-9): Low Risk  (09/25/2022)  Financial Resource Strain: Low Risk  (12/28/2021)  Physical Activity: Inactive (12/28/2021)  Stress: No Stress Concern Present (12/28/2021)  Tobacco Use: Medium Risk (12/11/2022)      Readmission Risk Interventions    12/07/2022   12:55 PM  Readmission Risk Prevention Plan  Transportation Screening Complete  PCP or Specialist Appt within 3-5 Days Complete  HRI or Home Care Consult Complete  Social Work Consult for Recovery Care Planning/Counseling Complete  Palliative Care Screening Not Applicable  Medication Review Oceanographer) Complete

## 2022-12-15 NOTE — Discharge Summary (Signed)
[facility] Discharge Summary   Patient: Alexis Tucker ZOX:[id number]  PCP: Eustaquio Boyden, MD  Date of admission: 12/03/2022   Date of discharge:  12/15/2022     Discharge Diagnoses:  Principal Problem:   ESRD (end stage renal disease) (HCC) Active Problems:   Acute respiratory failure with hypoxia (HCC)   Acute on chronic diastolic CHF (congestive heart failure) (HCC)   Multifocal pneumonia   Paroxysmal atrial fibrillation (HCC)   Post-surgical hypothyroidism   Type 2 diabetes mellitus with other specified complication (HCC)   HTN (hypertension)   Abdominal pain   GERD (gastroesophageal reflux disease)   Thrombocytopenia (HCC)   OA (osteoarthritis)   Diabetic retinopathy with macular edema (HCC)   History of arterial ischemic stroke   Osteoporosis   Malignant neoplasm of upper-outer quadrant of left breast in female, estrogen receptor positive (HCC)   DNR (do not resuscitate)   Anemia of chronic disease   Admitted From: Home Disposition:  SNF   Recommendations for Outpatient Follow-up:  Follow-up with PCP, patient should be seen by an MD in 1 to 2 days, monitor blood pressure and titrate medication accordingly. Follow with nephrology and continue hemodialysis Monday, Wednesday and Friday schedule. 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  Assessment and plan # ESRD (end stage renal disease) New hemodialysis started on 6/7, she  shd a PermCath placed on 6/7.  Outpatient dialysis set up has been done.  Patient was cleared by nephrology to discharge and follow-up as an outpatient and continue hemodialysis Monday, Wednesday and Friday schedule. # Acute respiratory failure with hypoxia: Resolved.  Likely secondary to multifocal pneumonia and fluid overload.  Patient on room air for past couple days. # Acute on chronic diastolic CHF (congestive heart failure) s/p Lasix to 40 mg,.  Continued Coreg home dose. # Multifocal pneumonia, Completed antibiotics # Paroxysmal atrial fibrillation:  Coreg and Eliquis decreased dose to 2.5 mg p.o. twice daily # HTN (hypertension), On amlodipine and Coreg # Type 2 diabetes mellitus with other specified complication: Last hemoglobin A1c 6.1, continued Tradjenta home medication.  Sinew diabetic diet # Post-surgical hypothyroidism, On Synthroid # Abdominal pain; Resolved.  S/p MiraLAX for possible constipation related. # Thrombocytopenia: Last platelet count 135.  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  Hepatitis viral load negative.  Patient likely had exposure to hepatitis B in the past and now immune.  Hepatitis C negative. # GERD (gastroesophageal reflux disease) On Protonix # Anemia of chronic disease; Received 1 unit of packed red blood cells during the hospital course.  Last hemoglobin 8.9, repeat CBC after 1 week.  Patient may qualify for Epogen due to ESRD if persistent low hemoglobin.  Follow-up with nephrology # Malignant neoplasm of upper-outer quadrant of left breast in female, estrogen receptor positive: Anastrozole 1 mg daily resumed  Body mass index is 29.33 kg/m.  Nutrition Interventions:   - Patient was instructed, not to drive, operate heavy machinery, perform activities at heights, swimming or participation in  water activities or provide baby sitting services while on Pain, Sleep and Anxiety Medications; until her outpatient Physician has  advised to do so again.  - Also recommended to not to take more than prescribed Pain, Sleep and Anxiety Medications.  Patient was seen by physical therapy, who recommended SNF placement, which was arranged. On the day of the discharge the patient's vitals were stable, and no other acute medical condition were reported by patient. the patient was felt safe to be discharge at [facility].  Consultants: Nephrology for HD, and vascular surgery for permacath insertion Procedures: Hemodialysis S/p permacath inserted on 12/08/2022  Discharge Exam: General: Appear in no distress, no Rash; Oral Mucosa Clear, moist. Cardiovascular: S1 and S2 Present, no Murmur, Respiratory: normal respiratory effort, Bilateral Air entry present and no Crackles, no wheezes Abdomen: Bowel Sound present, Soft and no tenderness, no hernia Extremities: no Pedal edema, no calf tenderness Neurology: alert and oriented to time, place, and person affect appropriate.  Filed Weights   12/13/22 0815 12/13/22 1144 12/15/22 0755  Weight: 74.7 kg 74.7 kg 75.1 kg   Vitals:   12/15/22 1030 12/15/22 1055  BP: (!) 150/59 124/76  Pulse: 75 77  Resp: (!) 5 (!) 22  Temp:    SpO2: 98% 100%    DISCHARGE MEDICATION: Allergies as of 12/15/2022       Reactions   Tramadol Other (See Comments)   nightmares   Codeine Anxiety   Sulfa Antibiotics Itching, Rash        Medication List     TAKE these medications    [employer] Blood Glucose Meter w/Device Kit Use as directed to check sugars daily 250.42   allopurinol 100 MG tablet Commonly known as: ZYLOPRIM Take 1 tablet (100 mg total) by mouth daily. For gout   amLODipine 10 MG tablet Commonly known as: NORVASC Take 10 mg by mouth daily.   anastrozole 1 MG tablet Commonly known as: ARIMIDEX TAKE 1 TABLET BY MOUTH EVERY DAY   apixaban 2.5 MG Tabs tablet Commonly known as: Eliquis Take 1 tablet (2.5 mg total) by mouth 2 (two) times daily. As blood thinner What changed:   medication strength how much to take   carvedilol 25 MG tablet Commonly known as: COREG Take 0.5 tablets (12.5 mg total) by mouth 2 (two) times daily with a meal.   cloNIDine HCl 0.1 MG Tb12 ER tablet Commonly known as: KAPVAY Take 0.1 mg by mouth 2 (two) times daily. PRN   isosorbide mononitrate 30 MG 24 hr tablet Commonly known as: IMDUR Take 1 tablet (30 mg total) by mouth daily.   levothyroxine 88 MCG tablet Commonly known as: SYNTHROID Take 1 tablet (88 mcg total) by mouth daily. For hypothyroidism   linagliptin 5 MG Tabs tablet Commonly known as: Tradjenta Take 1 tablet (5 mg total) by mouth daily.   pantoprazole 40 MG tablet Commonly known as: PROTONIX Take 1 tablet (40 mg total) by mouth daily as needed. For reflux   sodium bicarbonate 650 MG tablet Take 1,300 mg by mouth 2 (two) times daily.   Vitamin D-3 125 MCG (5000 UT) Tabs Take 1 tablet by mouth daily.       Allergies  Allergen Reactions   Tramadol Other (See Comments)    nightmares   Codeine Anxiety   Sulfa Antibiotics Itching and Rash   Discharge Instructions     Call MD for:  difficulty breathing, headache or visual disturbances   Complete by: As directed    Call MD  for:  extreme fatigue   Complete by: As directed    Call MD for:  persistant dizziness or light-headedness   Complete by: As directed    Call MD for:  persistant nausea and vomiting   Complete by: As directed    Call MD for:  severe uncontrolled pain   Complete by: As directed    Call MD for:  temperature >100.4   Complete by: As directed    Diet - low sodium heart healthy   Complete by: As directed    Discharge instructions   Complete by: As directed    Follow-up with PCP, patient should be seen by an MD in 1 to 2 days, monitor blood pressure and titrate medication accordingly. Follow with nephrology and continue hemodialysis Monday, Wednesday and Friday schedule.   Increase activity slowly   Complete by: As directed     No wound care   Complete by: As directed        The results of significant diagnostics from this hospitalization (including imaging, microbiology, ancillary and laboratory) are listed below for reference.    Significant Diagnostic Studies: PERIPHERAL VASCULAR CATHETERIZATION  Result Date: 12/08/2022 See surgical note for result.  DG Abd 2 Views  Result Date: 12/07/2022 CLINICAL DATA:  Abdominal pain. EXAM: ABDOMEN - 2 VIEW COMPARISON:  None. FINDINGS: Normal bowel gas pattern. No free air or bowel air-fluid levels. Right mid abdomen bowel anastomosis staples. Mild colonic stool burden. Previous cholecystectomy. Scattered calcifications that appear to be vascular. No convincing renal or ureteral stones. No acute skeletal abnormality. IMPRESSION: 1. No acute findings. No evidence of bowel obstruction. No free air. Electronically Signed   By: Amie Portland M.D.   On: 12/07/2022 16:10   DG Chest 2 View  Result Date: 12/03/2022 CLINICAL DATA:  82 year old female with shortness of breath. EXAM: CHEST - 2 VIEW COMPARISON:  CT chest 10/13/2022 and earlier. FINDINGS: PA and lateral views at 1219 hours demonstrate patchy, coarse, and confluent new left lung airspace opacity which appears to affect both the lingula and lower lobe. Similar lung volumes to April. No associated pleural effusion. Pulmonary vascularity elsewhere does not appear significantly changed. No pneumothorax. No confluent right lung opacity. Stable cardiac size and mediastinal contours. Calcified aortic atherosclerosis. Extensive abdominal Calcified aortic atherosclerosis. No acute osseous abnormality identified. Negative visible bowel gas. IMPRESSION: 1. New left lung multilobar patchy and confluent opacity, suspicious for aspiration or pneumonia. Asymmetric edema considered but felt unlikely. No pleural effusion. 2. Right lung appears negative. 3. Aortic Atherosclerosis (ICD10-I70.0). Electronically Signed   By: Odessa Fleming M.D.   On:  12/03/2022 12:34    Microbiology: No results found for this or any previous visit (from the past 240 hour(s)).   Labs: CBC: Recent Labs  Lab 12/09/22 0529 12/10/22 0510 12/11/22 0510 12/13/22 0420 12/15/22 0754  WBC 5.5  --   --  6.1 7.2  HGB 10.3* 9.3* 9.9* 9.4* 8.9*  HCT 32.3*  --   --  30.5* 28.5*  MCV 93.4  --   --  95.3 96.3  PLT 135*  --   --  168 162   Basic Metabolic Panel: Recent Labs  Lab 12/09/22 0529 12/11/22 0510 12/13/22 0420 12/15/22 0754  NA 140 140 142 141  K 3.6 3.4* 3.9 3.7  CL 102 103 104 101  CO2 25 27 31 31   GLUCOSE 106* 111* 104* 120*  BUN 42* 28* 19 25*  CREATININE 5.18* 4.66* 4.30* 4.65*  CALCIUM 8.6* 8.6* 8.8*  9.3  PHOS  --   --   --  4.0   Liver Function Tests: Recent Labs  Lab 12/15/22 0754  ALBUMIN 3.1*   No results for input(s): "LIPASE", "AMYLASE" in the last 168 hours. No results for input(s): "AMMONIA" in the last 168 hours. Cardiac Enzymes: No results for input(s): "CKTOTAL", "CKMB", "CKMBINDEX", "TROPONINI" in the last 168 hours. BNP (last 3 results) Recent Labs    04/22/22 1201 12/03/22 1545  BNP 690.4* 758.4*   CBG: Recent Labs  Lab 12/12/22 1600  GLUCAP 151*    Time spent: 35 minutes  Signed:  Gillis Santa  [facility]  12/15/2022 12:39 PM

## 2022-12-15 NOTE — Consult Note (Signed)
[facility]) Accountable Care Organization (ACO) [facility] Liaison Note  12/15/2022  Alexis Tucker 12/10/1940 [id number]  Location: [facility] RN Hospital Liaison screened the patient remotely at [facility].  Insurance: MCR ACO   [name] is a 82 y.o. female who is a Primary Care Patient of Eustaquio Boyden, MD [facility]). The patient was screened for  readmission hospitalization with noted high risk score for unplanned readmission risk with 1 IP in 6 months.  The patient was assessed for potential [facility]) Care Management service needs for post hospital transition for care coordination. Review of patient's electronic medical record reveals patient was admitted for ESRD/ Pneumonia. Pt discharging to [facility] for ongoing rehab today. Will collaborate with [facility] PAC-RN concerning pt's discharge disposition.   Plan: [facility] Liaison will continue to follow progress and disposition to asess for post hospital community care coordination/management needs.  Referral request for community care coordination: pending disposition.   [facility] Care Management/Population Health does not replace or interfere with any arrangements made by the Inpatient Transition of Care team.   For questions contact:   Elliot Cousin, RN, BSN [facility] Liaison [place]   [facility]  Population Health Office Hours MTWF  8:00 am-6:00 pm Off on Thursday [phone] mobile [phone] [Office toll free line][facility] Office Hours are M-F 8:30 - 5 pm 24 hour nurse advise line [phone] Concierge  [email or web address]

## 2022-12-15 NOTE — Progress Notes (Addendum)
I called report to Runell Gess, RN at [facility] number is [phone].

## 2022-12-15 NOTE — Progress Notes (Signed)
[facility]  ROUNDING NOTE   Subjective:   [name] was admitted to [facility] on 12/03/2022 for SOB (shortness of breath) [R06.02] Acute respiratory failure with hypoxia (HCC) [J96.01] Acute hypoxemic respiratory failure (HCC) [J96.01] Community acquired pneumonia of left upper lobe of lung [J18.9]  Patient seen and evaluated during dialysis   HEMODIALYSIS FLOWSHEET:  Blood Flow Rate (mL/min): 300 mL/min Arterial Pressure (mmHg): -110 mmHg Venous Pressure (mmHg): 110 mmHg TMP (mmHg): 10 mmHg Ultrafiltration Rate (mL/min): 267 mL/min Dialysate Flow Rate (mL/min): 300 ml/min Dialysis Fluid Bolus: Normal Saline  Resting quietly   Objective:  Vital signs in last 24 hours:  Temp:  [97.1 F (36.2 C)-98.6 F (37 C)] 98.3 F (36.8 C) (06/14 0750) Pulse Rate:  [70-79] 74 (06/14 1000) Resp:  [12-20] 16 (06/14 1000) BP: (114-152)/(43-61) 151/57 (06/14 1000) SpO2:  [94 %-100 %] 97 % (06/14 1000) Weight:  [75.1 kg] 75.1 kg (06/14 0755)  Weight change:  Filed Weights   12/13/22 0815 12/13/22 1144 12/15/22 0755  Weight: 74.7 kg 74.7 kg 75.1 kg    Intake/Output: I/O last 3 completed shifts: In: 380 [P.O.:380] Out: -    Intake/Output this shift:  No intake/output data recorded.  Physical Exam: General: NAD  Head: Normocephalic, atraumatic. Moist oral mucosal membranes  Eyes: Anicteric  Lungs:  Clear to auscultation  Heart: Regular rate and rhythm  Abdomen:  Soft, nontender  Extremities:  No peripheral edema.  Neurologic: Alert and oriented to self, moving all four extremities  Skin: No lesions  Access: AVG - no bruit or thrill, right chest PermCath placed on 12/08/2022    Basic Metabolic Panel: Recent Labs  Lab 12/09/22 0529 12/11/22 0510 12/13/22 0420 12/15/22 0754  NA 140 140 142 141  K 3.6 3.4* 3.9 3.7  CL 102 103 104 101  CO2 25 27 31 31   GLUCOSE 106* 111* 104* 120*  BUN 42* 28* 19 25*  CREATININE 5.18* 4.66* 4.30* 4.65*  CALCIUM  8.6* 8.6* 8.8* 9.3  PHOS  --   --   --  4.0     Liver Function Tests: Recent Labs  Lab 12/15/22 0754  ALBUMIN 3.1*    No results for input(s): "LIPASE", "AMYLASE" in the last 168 hours. No results for input(s): "AMMONIA" in the last 168 hours.  CBC: Recent Labs  Lab 12/09/22 0529 12/10/22 0510 12/11/22 0510 12/13/22 0420 12/15/22 0754  WBC 5.5  --   --  6.1 7.2  HGB 10.3* 9.3* 9.9* 9.4* 8.9*  HCT 32.3*  --   --  30.5* 28.5*  MCV 93.4  --   --  95.3 96.3  PLT 135*  --   --  168 162     Cardiac Enzymes: No results for input(s): "CKTOTAL", "CKMB", "CKMBINDEX", "TROPONINI" in the last 168 hours.  BNP: Invalid input(s): "POCBNP"  CBG: Recent Labs  Lab 12/12/22 1600  GLUCAP 151*     Microbiology: Results for orders placed or performed during the hospital encounter of 12/03/22  Resp panel by RT-PCR (RSV, Flu A&B, Covid) Anterior Nasal Swab     Status: None   Collection Time: 12/03/22  3:45 PM   Specimen: Anterior Nasal Swab  Result Value Ref Range Status   SARS Coronavirus 2 by RT PCR NEGATIVE NEGATIVE Final    Comment: (NOTE) SARS-CoV-2 target nucleic acids are NOT DETECTED.  The SARS-CoV-2 RNA is generally detectable in upper respiratory specimens during the acute phase of infection. The lowest concentration of SARS-CoV-2 viral copies  this assay can detect is 138 copies/mL. A negative result does not preclude SARS-Cov-2 infection and should not be used as the sole basis for treatment or other patient management decisions. A negative result may occur with  improper specimen collection/handling, submission of specimen other than nasopharyngeal swab, presence of viral mutation(s) within the areas targeted by this assay, and inadequate number of viral copies(<138 copies/mL). A negative result must be combined with clinical observations, patient history, and epidemiological information. The expected result is Negative.  Fact Sheet for Patients:   BloggerCourse.com  Fact Sheet for Healthcare Providers:  SeriousBroker.it  This test is no t yet approved or cleared by the [place] FDA and  has been authorized for detection and/or diagnosis of SARS-CoV-2 by FDA under an Emergency Use Authorization (EUA). This EUA will remain  in effect (meaning this test can be used) for the duration of the COVID-19 declaration under Section 564(b)(1) of the Act, 21 U.S.C.section 360bbb-3(b)(1), unless the authorization is terminated  or revoked sooner.       Influenza A by PCR NEGATIVE NEGATIVE Final   Influenza B by PCR NEGATIVE NEGATIVE Final    Comment: (NOTE) The Xpert Xpress SARS-CoV-2/FLU/RSV plus assay is intended as an aid in the diagnosis of influenza from Nasopharyngeal swab specimens and should not be used as a sole basis for treatment. Nasal washings and aspirates are unacceptable for Xpert Xpress SARS-CoV-2/FLU/RSV testing.  Fact Sheet for Patients: BloggerCourse.com  Fact Sheet for Healthcare Providers: SeriousBroker.it  This test is not yet approved or cleared by the [place] FDA and has been authorized for detection and/or diagnosis of SARS-CoV-2 by FDA under an Emergency Use Authorization (EUA). This EUA will remain in effect (meaning this test can be used) for the duration of the COVID-19 declaration under Section 564(b)(1) of the Act, 21 U.S.C. section 360bbb-3(b)(1), unless the authorization is terminated or revoked.     Resp Syncytial Virus by PCR NEGATIVE NEGATIVE Final    Comment: (NOTE) Fact Sheet for Patients: BloggerCourse.com  Fact Sheet for Healthcare Providers: SeriousBroker.it  This test is not yet approved or cleared by the [place] FDA and has been authorized for detection and/or diagnosis of SARS-CoV-2 by FDA under an Emergency Use  Authorization (EUA). This EUA will remain in effect (meaning this test can be used) for the duration of the COVID-19 declaration under Section 564(b)(1) of the Act, 21 U.S.C. section 360bbb-3(b)(1), unless the authorization is terminated or revoked.  Performed at [facility], [address] [place], [place] [address]   Blood Culture (routine x 2)     Status: None   Collection Time: 12/03/22  3:45 PM   Specimen: BLOOD  Result Value Ref Range Status   Specimen Description BLOOD BLOOD LEFT ARM  Final   Special Requests   Final    BOTTLES DRAWN AEROBIC AND ANAEROBIC Blood Culture adequate volume   Culture   Final    NO GROWTH 5 DAYS Performed at [facility], [address], [place], [place] [address]    Report Status 12/08/2022 FINAL  Final  Blood Culture (routine x 2)     Status: None   Collection Time: 12/03/22  4:30 PM   Specimen: BLOOD  Result Value Ref Range Status   Specimen Description BLOOD BLOOD LEFT ARM  Final   Special Requests   Final    BOTTLES DRAWN AEROBIC AND ANAEROBIC Blood Culture adequate volume   Culture   Final    NO GROWTH 5 DAYS  Performed at [facility], [address] [place], [place] [address]    Report Status 12/08/2022 FINAL  Final    Coagulation Studies: No results for input(s): "LABPROT", "INR" in the last 72 hours.   Urinalysis: No results for input(s): "COLORURINE", "LABSPEC", "PHURINE", "GLUCOSEU", "HGBUR", "BILIRUBINUR", "KETONESUR", "PROTEINUR", "UROBILINOGEN", "NITRITE", "LEUKOCYTESUR" in the last 72 hours.  Invalid input(s): "APPERANCEUR"     Imaging: No results found.   Medications:    anticoagulant sodium citrate       allopurinol  100 mg Oral Daily   amLODipine  10 mg Oral Daily   anastrozole  1 mg Oral Daily   apixaban  2.5 mg Oral BID   carvedilol  12.5 mg Oral BID WC   Chlorhexidine Gluconate Cloth  6 each Topical Q0600   feeding supplement (NEPRO CARB STEADY)  237 mL Oral TID BM    furosemide  40 mg Oral Once per day on Sun Tue Thu Sat   isosorbide mononitrate  30 mg Oral Daily   levothyroxine  88 mcg Oral QAC breakfast   linagliptin  5 mg Oral Daily   multivitamin  1 tablet Oral QHS   pantoprazole  40 mg Oral QHS   polyethylene glycol  17 g Oral Daily   sodium bicarbonate  1,300 mg Oral BID   acetaminophen, alteplase, anticoagulant sodium citrate, heparin, lidocaine (PF), lidocaine-prilocaine, oxyCODONE, pentafluoroprop-tetrafluoroeth, senna-docusate  Assessment/ Plan:  Ms. Lariza Zeltner is a 82 y.o.  female with breast cancer,  hypertension, hypothyroidism, GERD, atrial fibrillation, who was originally admitted to [facility] on 12/03/2022 for SOB (shortness of breath) [R06.02] Acute respiratory failure with hypoxia (HCC) [J96.01] Acute hypoxemic respiratory failure (HCC) [J96.01] Community acquired pneumonia of left upper lobe of lung [J18.9]  End stage renal disease requiring hemodialysis -Due for worsening creatinine and intermittent.  shortness of breath, we feel this is a progression of disease and time to initiate renal replacement therapy.  Patient and family agreeable to proceed. - Appreciate vascular surgery placing right chest PermCath on 12/08/2022. - Receiving dialysis today, no UF -Next treatment schedule for Monday - Appreciate renal navigator confirming outpatient dialysis clinic at [facility] on a MWF schedule. Patient cleared to begin treatment on Monday.  Hypertension with chronic kidney disease: continue carvedilol, isosorbide mononitrate and amlodipine.  Blood pressure 149/51 during dialysis  Anemia of chronic kidney disease: Will continue to monitor and access need for ESA  Acute exacerbation of chronic congestive heart failure -Fluid management with furosemide on non-dialysis days and hemodialysis.    LOS: 12   6/14/202410:23 AM

## 2022-12-15 NOTE — Progress Notes (Signed)
Hemodialysis Note  Received patient in bed to unit. Alert and oriented. Informed consent signed and in chart.   Treatment initiated:0752  Treatment completed:1059  Patient tolerated treatment well. Transported back to the room alert, without acute distress. Report given to patient's RN.  Access used:RIJ Catheter  Access issues: None   Total UF removed:0  Medications given:  Heparin  Post HD VS: Stable  Post HD weight:75.1 kg   Bartolo Darter, RN  [facility]

## 2022-12-18 DIAGNOSIS — N2581 Secondary hyperparathyroidism of renal origin: Secondary | ICD-10-CM | POA: Diagnosis not present

## 2022-12-18 DIAGNOSIS — N186 End stage renal disease: Secondary | ICD-10-CM | POA: Diagnosis not present

## 2022-12-18 DIAGNOSIS — Z992 Dependence on renal dialysis: Secondary | ICD-10-CM | POA: Diagnosis not present

## 2022-12-19 ENCOUNTER — Other Ambulatory Visit: Payer: Self-pay | Admitting: *Deleted

## 2022-12-19 DIAGNOSIS — R2689 Other abnormalities of gait and mobility: Secondary | ICD-10-CM | POA: Diagnosis not present

## 2022-12-19 DIAGNOSIS — I2510 Atherosclerotic heart disease of native coronary artery without angina pectoris: Secondary | ICD-10-CM | POA: Diagnosis not present

## 2022-12-19 DIAGNOSIS — M150 Primary generalized (osteo)arthritis: Secondary | ICD-10-CM | POA: Diagnosis not present

## 2022-12-19 DIAGNOSIS — I5032 Chronic diastolic (congestive) heart failure: Secondary | ICD-10-CM | POA: Diagnosis not present

## 2022-12-19 DIAGNOSIS — E1122 Type 2 diabetes mellitus with diabetic chronic kidney disease: Secondary | ICD-10-CM | POA: Diagnosis not present

## 2022-12-19 DIAGNOSIS — Z741 Need for assistance with personal care: Secondary | ICD-10-CM | POA: Diagnosis not present

## 2022-12-19 DIAGNOSIS — I4891 Unspecified atrial fibrillation: Secondary | ICD-10-CM | POA: Diagnosis not present

## 2022-12-19 DIAGNOSIS — N189 Chronic kidney disease, unspecified: Secondary | ICD-10-CM | POA: Diagnosis not present

## 2022-12-19 DIAGNOSIS — E559 Vitamin D deficiency, unspecified: Secondary | ICD-10-CM | POA: Diagnosis not present

## 2022-12-19 DIAGNOSIS — M109 Gout, unspecified: Secondary | ICD-10-CM | POA: Diagnosis not present

## 2022-12-19 DIAGNOSIS — Z992 Dependence on renal dialysis: Secondary | ICD-10-CM | POA: Diagnosis not present

## 2022-12-19 DIAGNOSIS — J9601 Acute respiratory failure with hypoxia: Secondary | ICD-10-CM | POA: Diagnosis not present

## 2022-12-19 DIAGNOSIS — H548 Legal blindness, as defined in USA: Secondary | ICD-10-CM | POA: Diagnosis not present

## 2022-12-19 DIAGNOSIS — K219 Gastro-esophageal reflux disease without esophagitis: Secondary | ICD-10-CM | POA: Diagnosis not present

## 2022-12-19 DIAGNOSIS — I480 Paroxysmal atrial fibrillation: Secondary | ICD-10-CM | POA: Diagnosis not present

## 2022-12-19 DIAGNOSIS — M6281 Muscle weakness (generalized): Secondary | ICD-10-CM | POA: Diagnosis not present

## 2022-12-19 DIAGNOSIS — J189 Pneumonia, unspecified organism: Secondary | ICD-10-CM | POA: Diagnosis not present

## 2022-12-19 DIAGNOSIS — I10 Essential (primary) hypertension: Secondary | ICD-10-CM | POA: Diagnosis not present

## 2022-12-19 DIAGNOSIS — M6259 Muscle wasting and atrophy, not elsewhere classified, multiple sites: Secondary | ICD-10-CM | POA: Diagnosis not present

## 2022-12-19 DIAGNOSIS — N185 Chronic kidney disease, stage 5: Secondary | ICD-10-CM | POA: Diagnosis not present

## 2022-12-19 DIAGNOSIS — N184 Chronic kidney disease, stage 4 (severe): Secondary | ICD-10-CM | POA: Diagnosis not present

## 2022-12-19 DIAGNOSIS — E039 Hypothyroidism, unspecified: Secondary | ICD-10-CM | POA: Diagnosis not present

## 2022-12-19 NOTE — Patient Outreach (Signed)
Late entry 12/18/22  Per [facility] Ms. Polsinelli resides in [facility] skilled [facility] facility. Screening for potential care coordination services as benefit of health plan and Primary Care Provider.   Secure communication sent to Terri, SNF social worker, to collaborate about transition plans and potential care coordination needs.   Will continue to follow.   Raiford Noble, MSN, RN,BSN [facility] Post Acute Care Coordinator [phone] (Direct dial)

## 2022-12-20 DIAGNOSIS — N186 End stage renal disease: Secondary | ICD-10-CM | POA: Diagnosis not present

## 2022-12-20 DIAGNOSIS — N2581 Secondary hyperparathyroidism of renal origin: Secondary | ICD-10-CM | POA: Diagnosis not present

## 2022-12-20 DIAGNOSIS — Z992 Dependence on renal dialysis: Secondary | ICD-10-CM | POA: Diagnosis not present

## 2022-12-22 ENCOUNTER — Encounter: Payer: Medicare Other | Admitting: Pharmacist

## 2022-12-22 DIAGNOSIS — N186 End stage renal disease: Secondary | ICD-10-CM | POA: Diagnosis not present

## 2022-12-22 DIAGNOSIS — Z992 Dependence on renal dialysis: Secondary | ICD-10-CM | POA: Diagnosis not present

## 2022-12-22 DIAGNOSIS — N2581 Secondary hyperparathyroidism of renal origin: Secondary | ICD-10-CM | POA: Diagnosis not present

## 2022-12-25 DIAGNOSIS — N186 End stage renal disease: Secondary | ICD-10-CM | POA: Diagnosis not present

## 2022-12-25 DIAGNOSIS — N2581 Secondary hyperparathyroidism of renal origin: Secondary | ICD-10-CM | POA: Diagnosis not present

## 2022-12-25 DIAGNOSIS — Z992 Dependence on renal dialysis: Secondary | ICD-10-CM | POA: Diagnosis not present

## 2022-12-26 DIAGNOSIS — I480 Paroxysmal atrial fibrillation: Secondary | ICD-10-CM | POA: Diagnosis not present

## 2022-12-26 DIAGNOSIS — Z992 Dependence on renal dialysis: Secondary | ICD-10-CM | POA: Diagnosis not present

## 2022-12-26 DIAGNOSIS — E039 Hypothyroidism, unspecified: Secondary | ICD-10-CM | POA: Diagnosis not present

## 2022-12-26 DIAGNOSIS — E559 Vitamin D deficiency, unspecified: Secondary | ICD-10-CM | POA: Diagnosis not present

## 2022-12-26 DIAGNOSIS — M109 Gout, unspecified: Secondary | ICD-10-CM | POA: Diagnosis not present

## 2022-12-26 DIAGNOSIS — K219 Gastro-esophageal reflux disease without esophagitis: Secondary | ICD-10-CM | POA: Diagnosis not present

## 2022-12-26 DIAGNOSIS — N189 Chronic kidney disease, unspecified: Secondary | ICD-10-CM | POA: Diagnosis not present

## 2022-12-26 DIAGNOSIS — I10 Essential (primary) hypertension: Secondary | ICD-10-CM | POA: Diagnosis not present

## 2022-12-26 DIAGNOSIS — M6281 Muscle weakness (generalized): Secondary | ICD-10-CM | POA: Diagnosis not present

## 2022-12-26 DIAGNOSIS — I2510 Atherosclerotic heart disease of native coronary artery without angina pectoris: Secondary | ICD-10-CM | POA: Diagnosis not present

## 2022-12-26 DIAGNOSIS — E1122 Type 2 diabetes mellitus with diabetic chronic kidney disease: Secondary | ICD-10-CM | POA: Diagnosis not present

## 2022-12-26 DIAGNOSIS — N184 Chronic kidney disease, stage 4 (severe): Secondary | ICD-10-CM | POA: Diagnosis not present

## 2022-12-27 ENCOUNTER — Encounter: Payer: Self-pay | Admitting: Family Medicine

## 2022-12-27 DIAGNOSIS — N2581 Secondary hyperparathyroidism of renal origin: Secondary | ICD-10-CM | POA: Diagnosis not present

## 2022-12-27 DIAGNOSIS — Z992 Dependence on renal dialysis: Secondary | ICD-10-CM | POA: Diagnosis not present

## 2022-12-27 DIAGNOSIS — N186 End stage renal disease: Secondary | ICD-10-CM | POA: Diagnosis not present

## 2022-12-28 ENCOUNTER — Encounter: Payer: Self-pay | Admitting: Internal Medicine

## 2022-12-28 DIAGNOSIS — H548 Legal blindness, as defined in USA: Secondary | ICD-10-CM | POA: Diagnosis not present

## 2022-12-28 DIAGNOSIS — K219 Gastro-esophageal reflux disease without esophagitis: Secondary | ICD-10-CM | POA: Diagnosis not present

## 2022-12-28 DIAGNOSIS — R4182 Altered mental status, unspecified: Secondary | ICD-10-CM | POA: Diagnosis not present

## 2022-12-28 DIAGNOSIS — M150 Primary generalized (osteo)arthritis: Secondary | ICD-10-CM | POA: Diagnosis not present

## 2022-12-28 DIAGNOSIS — E1122 Type 2 diabetes mellitus with diabetic chronic kidney disease: Secondary | ICD-10-CM | POA: Diagnosis not present

## 2022-12-28 DIAGNOSIS — Z992 Dependence on renal dialysis: Secondary | ICD-10-CM | POA: Diagnosis not present

## 2022-12-28 DIAGNOSIS — E039 Hypothyroidism, unspecified: Secondary | ICD-10-CM | POA: Diagnosis not present

## 2022-12-28 DIAGNOSIS — M6281 Muscle weakness (generalized): Secondary | ICD-10-CM | POA: Diagnosis not present

## 2022-12-28 DIAGNOSIS — I10 Essential (primary) hypertension: Secondary | ICD-10-CM | POA: Diagnosis not present

## 2022-12-28 DIAGNOSIS — J9601 Acute respiratory failure with hypoxia: Secondary | ICD-10-CM | POA: Diagnosis not present

## 2022-12-28 DIAGNOSIS — I480 Paroxysmal atrial fibrillation: Secondary | ICD-10-CM | POA: Diagnosis not present

## 2022-12-28 DIAGNOSIS — N185 Chronic kidney disease, stage 5: Secondary | ICD-10-CM | POA: Diagnosis not present

## 2022-12-29 DIAGNOSIS — E559 Vitamin D deficiency, unspecified: Secondary | ICD-10-CM | POA: Diagnosis not present

## 2022-12-29 DIAGNOSIS — K219 Gastro-esophageal reflux disease without esophagitis: Secondary | ICD-10-CM | POA: Diagnosis not present

## 2022-12-29 DIAGNOSIS — E039 Hypothyroidism, unspecified: Secondary | ICD-10-CM | POA: Diagnosis not present

## 2022-12-29 DIAGNOSIS — E1122 Type 2 diabetes mellitus with diabetic chronic kidney disease: Secondary | ICD-10-CM | POA: Diagnosis not present

## 2022-12-29 DIAGNOSIS — Z992 Dependence on renal dialysis: Secondary | ICD-10-CM | POA: Diagnosis not present

## 2022-12-29 DIAGNOSIS — N184 Chronic kidney disease, stage 4 (severe): Secondary | ICD-10-CM | POA: Diagnosis not present

## 2022-12-29 DIAGNOSIS — N189 Chronic kidney disease, unspecified: Secondary | ICD-10-CM | POA: Diagnosis not present

## 2022-12-29 DIAGNOSIS — M6281 Muscle weakness (generalized): Secondary | ICD-10-CM | POA: Diagnosis not present

## 2022-12-29 DIAGNOSIS — M109 Gout, unspecified: Secondary | ICD-10-CM | POA: Diagnosis not present

## 2022-12-29 DIAGNOSIS — I10 Essential (primary) hypertension: Secondary | ICD-10-CM | POA: Diagnosis not present

## 2022-12-29 DIAGNOSIS — I480 Paroxysmal atrial fibrillation: Secondary | ICD-10-CM | POA: Diagnosis not present

## 2022-12-29 DIAGNOSIS — I2510 Atherosclerotic heart disease of native coronary artery without angina pectoris: Secondary | ICD-10-CM | POA: Diagnosis not present

## 2023-01-01 DIAGNOSIS — D509 Iron deficiency anemia, unspecified: Secondary | ICD-10-CM | POA: Diagnosis not present

## 2023-01-01 DIAGNOSIS — Z992 Dependence on renal dialysis: Secondary | ICD-10-CM | POA: Diagnosis not present

## 2023-01-01 DIAGNOSIS — N186 End stage renal disease: Secondary | ICD-10-CM | POA: Diagnosis not present

## 2023-01-01 DIAGNOSIS — N2581 Secondary hyperparathyroidism of renal origin: Secondary | ICD-10-CM | POA: Diagnosis not present

## 2023-01-02 DIAGNOSIS — I10 Essential (primary) hypertension: Secondary | ICD-10-CM | POA: Diagnosis not present

## 2023-01-02 DIAGNOSIS — I5032 Chronic diastolic (congestive) heart failure: Secondary | ICD-10-CM | POA: Diagnosis not present

## 2023-01-02 DIAGNOSIS — I480 Paroxysmal atrial fibrillation: Secondary | ICD-10-CM | POA: Diagnosis not present

## 2023-01-02 DIAGNOSIS — I2510 Atherosclerotic heart disease of native coronary artery without angina pectoris: Secondary | ICD-10-CM | POA: Diagnosis not present

## 2023-01-02 DIAGNOSIS — Z992 Dependence on renal dialysis: Secondary | ICD-10-CM | POA: Diagnosis not present

## 2023-01-02 DIAGNOSIS — K219 Gastro-esophageal reflux disease without esophagitis: Secondary | ICD-10-CM | POA: Diagnosis not present

## 2023-01-02 DIAGNOSIS — R2689 Other abnormalities of gait and mobility: Secondary | ICD-10-CM | POA: Diagnosis not present

## 2023-01-02 DIAGNOSIS — E039 Hypothyroidism, unspecified: Secondary | ICD-10-CM | POA: Diagnosis not present

## 2023-01-02 DIAGNOSIS — J189 Pneumonia, unspecified organism: Secondary | ICD-10-CM | POA: Diagnosis not present

## 2023-01-02 DIAGNOSIS — Z741 Need for assistance with personal care: Secondary | ICD-10-CM | POA: Diagnosis not present

## 2023-01-02 DIAGNOSIS — M109 Gout, unspecified: Secondary | ICD-10-CM | POA: Diagnosis not present

## 2023-01-02 DIAGNOSIS — N184 Chronic kidney disease, stage 4 (severe): Secondary | ICD-10-CM | POA: Diagnosis not present

## 2023-01-02 DIAGNOSIS — M6281 Muscle weakness (generalized): Secondary | ICD-10-CM | POA: Diagnosis not present

## 2023-01-02 DIAGNOSIS — M6259 Muscle wasting and atrophy, not elsewhere classified, multiple sites: Secondary | ICD-10-CM | POA: Diagnosis not present

## 2023-01-02 DIAGNOSIS — N189 Chronic kidney disease, unspecified: Secondary | ICD-10-CM | POA: Diagnosis not present

## 2023-01-02 DIAGNOSIS — E559 Vitamin D deficiency, unspecified: Secondary | ICD-10-CM | POA: Diagnosis not present

## 2023-01-02 DIAGNOSIS — E1122 Type 2 diabetes mellitus with diabetic chronic kidney disease: Secondary | ICD-10-CM | POA: Diagnosis not present

## 2023-01-03 DIAGNOSIS — N186 End stage renal disease: Secondary | ICD-10-CM | POA: Diagnosis not present

## 2023-01-03 DIAGNOSIS — Z992 Dependence on renal dialysis: Secondary | ICD-10-CM | POA: Diagnosis not present

## 2023-01-03 DIAGNOSIS — N2581 Secondary hyperparathyroidism of renal origin: Secondary | ICD-10-CM | POA: Diagnosis not present

## 2023-01-03 DIAGNOSIS — D509 Iron deficiency anemia, unspecified: Secondary | ICD-10-CM | POA: Diagnosis not present

## 2023-01-05 DIAGNOSIS — N186 End stage renal disease: Secondary | ICD-10-CM | POA: Diagnosis not present

## 2023-01-05 DIAGNOSIS — D509 Iron deficiency anemia, unspecified: Secondary | ICD-10-CM | POA: Diagnosis not present

## 2023-01-05 DIAGNOSIS — N2581 Secondary hyperparathyroidism of renal origin: Secondary | ICD-10-CM | POA: Diagnosis not present

## 2023-01-05 DIAGNOSIS — Z992 Dependence on renal dialysis: Secondary | ICD-10-CM | POA: Diagnosis not present

## 2023-01-08 DIAGNOSIS — N186 End stage renal disease: Secondary | ICD-10-CM | POA: Diagnosis not present

## 2023-01-08 DIAGNOSIS — N2581 Secondary hyperparathyroidism of renal origin: Secondary | ICD-10-CM | POA: Diagnosis not present

## 2023-01-08 DIAGNOSIS — Z992 Dependence on renal dialysis: Secondary | ICD-10-CM | POA: Diagnosis not present

## 2023-01-08 DIAGNOSIS — D509 Iron deficiency anemia, unspecified: Secondary | ICD-10-CM | POA: Diagnosis not present

## 2023-01-09 DIAGNOSIS — M6281 Muscle weakness (generalized): Secondary | ICD-10-CM | POA: Diagnosis not present

## 2023-01-09 DIAGNOSIS — I2510 Atherosclerotic heart disease of native coronary artery without angina pectoris: Secondary | ICD-10-CM | POA: Diagnosis not present

## 2023-01-09 DIAGNOSIS — I480 Paroxysmal atrial fibrillation: Secondary | ICD-10-CM | POA: Diagnosis not present

## 2023-01-09 DIAGNOSIS — E559 Vitamin D deficiency, unspecified: Secondary | ICD-10-CM | POA: Diagnosis not present

## 2023-01-09 DIAGNOSIS — N189 Chronic kidney disease, unspecified: Secondary | ICD-10-CM | POA: Diagnosis not present

## 2023-01-09 DIAGNOSIS — K219 Gastro-esophageal reflux disease without esophagitis: Secondary | ICD-10-CM | POA: Diagnosis not present

## 2023-01-09 DIAGNOSIS — E1122 Type 2 diabetes mellitus with diabetic chronic kidney disease: Secondary | ICD-10-CM | POA: Diagnosis not present

## 2023-01-09 DIAGNOSIS — M109 Gout, unspecified: Secondary | ICD-10-CM | POA: Diagnosis not present

## 2023-01-09 DIAGNOSIS — I10 Essential (primary) hypertension: Secondary | ICD-10-CM | POA: Diagnosis not present

## 2023-01-09 DIAGNOSIS — E039 Hypothyroidism, unspecified: Secondary | ICD-10-CM | POA: Diagnosis not present

## 2023-01-09 DIAGNOSIS — N184 Chronic kidney disease, stage 4 (severe): Secondary | ICD-10-CM | POA: Diagnosis not present

## 2023-01-09 DIAGNOSIS — Z992 Dependence on renal dialysis: Secondary | ICD-10-CM | POA: Diagnosis not present

## 2023-01-10 DIAGNOSIS — I5032 Chronic diastolic (congestive) heart failure: Secondary | ICD-10-CM | POA: Diagnosis not present

## 2023-01-10 DIAGNOSIS — R2689 Other abnormalities of gait and mobility: Secondary | ICD-10-CM | POA: Diagnosis not present

## 2023-01-10 DIAGNOSIS — J189 Pneumonia, unspecified organism: Secondary | ICD-10-CM | POA: Diagnosis not present

## 2023-01-10 DIAGNOSIS — M6259 Muscle wasting and atrophy, not elsewhere classified, multiple sites: Secondary | ICD-10-CM | POA: Diagnosis not present

## 2023-01-10 DIAGNOSIS — Z741 Need for assistance with personal care: Secondary | ICD-10-CM | POA: Diagnosis not present

## 2023-01-10 DIAGNOSIS — Z992 Dependence on renal dialysis: Secondary | ICD-10-CM | POA: Diagnosis not present

## 2023-01-10 DIAGNOSIS — I480 Paroxysmal atrial fibrillation: Secondary | ICD-10-CM | POA: Diagnosis not present

## 2023-01-10 DIAGNOSIS — M6281 Muscle weakness (generalized): Secondary | ICD-10-CM | POA: Diagnosis not present

## 2023-01-11 DIAGNOSIS — N184 Chronic kidney disease, stage 4 (severe): Secondary | ICD-10-CM | POA: Diagnosis not present

## 2023-01-11 DIAGNOSIS — K219 Gastro-esophageal reflux disease without esophagitis: Secondary | ICD-10-CM | POA: Diagnosis not present

## 2023-01-11 DIAGNOSIS — Z992 Dependence on renal dialysis: Secondary | ICD-10-CM | POA: Diagnosis not present

## 2023-01-11 DIAGNOSIS — I480 Paroxysmal atrial fibrillation: Secondary | ICD-10-CM | POA: Diagnosis not present

## 2023-01-11 DIAGNOSIS — N189 Chronic kidney disease, unspecified: Secondary | ICD-10-CM | POA: Diagnosis not present

## 2023-01-11 DIAGNOSIS — M6281 Muscle weakness (generalized): Secondary | ICD-10-CM | POA: Diagnosis not present

## 2023-01-11 DIAGNOSIS — E1122 Type 2 diabetes mellitus with diabetic chronic kidney disease: Secondary | ICD-10-CM | POA: Diagnosis not present

## 2023-01-11 DIAGNOSIS — E559 Vitamin D deficiency, unspecified: Secondary | ICD-10-CM | POA: Diagnosis not present

## 2023-01-11 DIAGNOSIS — I2510 Atherosclerotic heart disease of native coronary artery without angina pectoris: Secondary | ICD-10-CM | POA: Diagnosis not present

## 2023-01-11 DIAGNOSIS — I10 Essential (primary) hypertension: Secondary | ICD-10-CM | POA: Diagnosis not present

## 2023-01-11 DIAGNOSIS — E039 Hypothyroidism, unspecified: Secondary | ICD-10-CM | POA: Diagnosis not present

## 2023-01-11 DIAGNOSIS — M109 Gout, unspecified: Secondary | ICD-10-CM | POA: Diagnosis not present

## 2023-01-12 DIAGNOSIS — Z992 Dependence on renal dialysis: Secondary | ICD-10-CM | POA: Diagnosis not present

## 2023-01-12 DIAGNOSIS — N186 End stage renal disease: Secondary | ICD-10-CM | POA: Diagnosis not present

## 2023-01-12 DIAGNOSIS — D509 Iron deficiency anemia, unspecified: Secondary | ICD-10-CM | POA: Diagnosis not present

## 2023-01-12 DIAGNOSIS — N2581 Secondary hyperparathyroidism of renal origin: Secondary | ICD-10-CM | POA: Diagnosis not present

## 2023-01-15 DIAGNOSIS — N186 End stage renal disease: Secondary | ICD-10-CM | POA: Diagnosis not present

## 2023-01-15 DIAGNOSIS — Z992 Dependence on renal dialysis: Secondary | ICD-10-CM | POA: Diagnosis not present

## 2023-01-15 DIAGNOSIS — N2581 Secondary hyperparathyroidism of renal origin: Secondary | ICD-10-CM | POA: Diagnosis not present

## 2023-01-15 DIAGNOSIS — D509 Iron deficiency anemia, unspecified: Secondary | ICD-10-CM | POA: Diagnosis not present

## 2023-01-16 ENCOUNTER — Other Ambulatory Visit: Payer: Self-pay | Admitting: *Deleted

## 2023-01-16 DIAGNOSIS — M6281 Muscle weakness (generalized): Secondary | ICD-10-CM | POA: Diagnosis not present

## 2023-01-16 DIAGNOSIS — I480 Paroxysmal atrial fibrillation: Secondary | ICD-10-CM | POA: Diagnosis not present

## 2023-01-16 DIAGNOSIS — I5032 Chronic diastolic (congestive) heart failure: Secondary | ICD-10-CM | POA: Diagnosis not present

## 2023-01-16 DIAGNOSIS — Z741 Need for assistance with personal care: Secondary | ICD-10-CM | POA: Diagnosis not present

## 2023-01-16 DIAGNOSIS — R2689 Other abnormalities of gait and mobility: Secondary | ICD-10-CM | POA: Diagnosis not present

## 2023-01-16 DIAGNOSIS — M6259 Muscle wasting and atrophy, not elsewhere classified, multiple sites: Secondary | ICD-10-CM | POA: Diagnosis not present

## 2023-01-16 DIAGNOSIS — J189 Pneumonia, unspecified organism: Secondary | ICD-10-CM | POA: Diagnosis not present

## 2023-01-16 DIAGNOSIS — Z992 Dependence on renal dialysis: Secondary | ICD-10-CM | POA: Diagnosis not present

## 2023-01-16 NOTE — Patient Outreach (Signed)
Per [facility] Ms. Glotfelty  resides in [facility]. Screening for potential [facility] care coordination services as benefit of health plan and Primary Care Provider.   Update received from [facility] facility social worker, indicating Ms. Schellhorn's anticipated transition plan is to return home with daughter.  Will plan outreach to daughter to discuss care coordination services.   Raiford Noble, MSN, RN,BSN [facility] Post Acute Care Coordinator [phone] (Direct dial)

## 2023-01-17 ENCOUNTER — Emergency Department
Admission: EM | Admit: 2023-01-17 | Discharge: 2023-01-17 | Disposition: A | Discharge status: Home / Self Care | Payer: Medicare Other | Attending: Emergency Medicine | Admitting: Emergency Medicine

## 2023-01-17 ENCOUNTER — Other Ambulatory Visit: Payer: Self-pay

## 2023-01-17 ENCOUNTER — Emergency Department: Payer: Medicare Other

## 2023-01-17 DIAGNOSIS — Z992 Dependence on renal dialysis: Secondary | ICD-10-CM | POA: Diagnosis not present

## 2023-01-17 DIAGNOSIS — R296 Repeated falls: Secondary | ICD-10-CM | POA: Diagnosis not present

## 2023-01-17 DIAGNOSIS — E1122 Type 2 diabetes mellitus with diabetic chronic kidney disease: Secondary | ICD-10-CM | POA: Diagnosis not present

## 2023-01-17 DIAGNOSIS — F0390 Unspecified dementia without behavioral disturbance: Secondary | ICD-10-CM | POA: Diagnosis not present

## 2023-01-17 DIAGNOSIS — Z8673 Personal history of transient ischemic attack (TIA), and cerebral infarction without residual deficits: Secondary | ICD-10-CM | POA: Insufficient documentation

## 2023-01-17 DIAGNOSIS — I700 Atherosclerosis of aorta: Secondary | ICD-10-CM | POA: Diagnosis not present

## 2023-01-17 DIAGNOSIS — S0003XA Contusion of scalp, initial encounter: Secondary | ICD-10-CM | POA: Diagnosis not present

## 2023-01-17 DIAGNOSIS — S0990XA Unspecified injury of head, initial encounter: Secondary | ICD-10-CM | POA: Insufficient documentation

## 2023-01-17 DIAGNOSIS — S199XXA Unspecified injury of neck, initial encounter: Secondary | ICD-10-CM | POA: Diagnosis not present

## 2023-01-17 DIAGNOSIS — N186 End stage renal disease: Secondary | ICD-10-CM | POA: Insufficient documentation

## 2023-01-17 DIAGNOSIS — W1830XA Fall on same level, unspecified, initial encounter: Secondary | ICD-10-CM | POA: Insufficient documentation

## 2023-01-17 DIAGNOSIS — Z043 Encounter for examination and observation following other accident: Secondary | ICD-10-CM | POA: Diagnosis not present

## 2023-01-17 DIAGNOSIS — W19XXXA Unspecified fall, initial encounter: Secondary | ICD-10-CM

## 2023-01-17 DIAGNOSIS — I6782 Cerebral ischemia: Secondary | ICD-10-CM | POA: Diagnosis not present

## 2023-01-17 LAB — BASIC METABOLIC PANEL
Anion gap: 10 (ref 5–15)
BUN: 30 mg/dL — ABNORMAL HIGH (ref 8–23)
CO2: 25 mmol/L (ref 22–32)
Calcium: 9 mg/dL (ref 8.9–10.3)
Chloride: 100 mmol/L (ref 98–111)
Creatinine, Ser: 5.19 mg/dL — ABNORMAL HIGH (ref 0.44–1.00)
GFR, Estimated: 8 mL/min — ABNORMAL LOW (ref >60)
Glucose, Bld: 91 mg/dL (ref 70–99)
Potassium: 4 mmol/L (ref 3.5–5.1)
Sodium: 135 mmol/L (ref 135–145)

## 2023-01-17 LAB — CBC
HCT: 37.7 % (ref 36.0–46.0)
Hemoglobin: 11.8 g/dL — ABNORMAL LOW (ref 12.0–15.0)
MCH: 30.4 pg (ref 26.0–34.0)
MCHC: 31.3 g/dL (ref 30.0–36.0)
MCV: 97.2 fL (ref 80.0–100.0)
Platelets: 84 10*3/uL — ABNORMAL LOW (ref 150–400)
RBC: 3.88 MIL/uL (ref 3.87–5.11)
RDW: 13.9 % (ref 11.5–15.5)
WBC: 4.8 10*3/uL (ref 4.0–10.5)
nRBC: 0 % (ref 0.0–0.2)

## 2023-01-17 MED ORDER — HALOPERIDOL LACTATE 5 MG/ML IJ SOLN
5.0000 mg | Freq: Once | INTRAMUSCULAR | Status: AC
  Administered 2023-01-17: 5 mg via INTRAMUSCULAR
  Filled 2023-01-17: qty 1

## 2023-01-17 NOTE — Discharge Instructions (Signed)
Please follow-up with your dialysis center to arrange dialysis for either tomorrow or Friday.  Return to the emergency department for any symptom concerning to yourself.  Your workup in the emergency department is shown reassuring results today.

## 2023-01-17 NOTE — ED Triage Notes (Signed)
Patient is from [facility] and is here today for head injury following a fall that happened at 03:00 this morning; Patient does take Eliquis; No LOC reported to EMS or family (daughter is here with patient), patient has no complaints and denies pain

## 2023-01-17 NOTE — ED Notes (Signed)
First Nurse Note: Patient to ED via [facility] from [facility] for unwitnessed fall. Patient was found in floor at 3am by staff. Patient was assessed by NP and found to have hematoma. Sent for further evaluation.

## 2023-01-17 NOTE — ED Provider Notes (Addendum)
[facility] Provider Note    Event Date/Time   First MD Initiated Contact with Patient 01/17/23 1314     (approximate)  History   Chief Complaint: Fall (Patient is from [facility] and is here today for head injury following a fall that happened at 03:00 this morning; Patient does take Eliquis; No LOC reported to EMS or family (daughter is here with patient), patient has no complaints and denies pain)  HPI  Alexis Tucker is a 82 y.o. female with a past medical history of ESRD on HD Monday/Wednesday/Friday, diabetes, prior CVA, presents to the emergency department after a fall.  According to family member who is here with the patient states that the patient refused her dialysis earlier this morning and then around lunchtime had a fall.  No injuries.  Patient is awake alert no distress.  Patient denies any pain.  No signs of any injuries on exam no hematomas or signs of head injury.  Physical Exam   Triage Vital Signs: ED Triage Vitals [01/17/23 1222]  Encounter Vitals Group     BP 122/61     Systolic BP Percentile      Diastolic BP Percentile      Pulse Rate 88     Resp 19     Temp 98.4 F (36.9 C)     Temp Source Oral     SpO2 100 %     Weight 169 lb (76.7 kg)     Height 5\' 3"  (1.6 m)     Head Circumference      Peak Flow      Pain Score 0     Pain Loc      Pain Education      Exclude from Growth Chart     Most recent vital signs: Vitals:   01/17/23 1222  BP: 122/61  Pulse: 88  Resp: 19  Temp: 98.4 F (36.9 C)  SpO2: 100%    General: Awake, no distress.  No scalp hematomas bruising or lacerations. CV:  Good peripheral perfusion.  Regular rate and rhythm  Resp:  Normal effort.  Equal breath sounds bilaterally.  Abd:  No distention.  Soft, nontender.  No rebound or guarding. Other:  Good range of motion all extremities without pain elicited.   ED Results / Procedures / Treatments   RADIOLOGY  I reviewed and  interpreted CT images.  Patient appears to have an old infarct but no bleed or significant abnormality seen on my evaluation. Radiology is read the CT scan of the head and C-spine is negative for acute abnormality.   MEDICATIONS ORDERED IN ED: Medications - No data to display   IMPRESSION / MDM / ASSESSMENT AND PLAN / ED COURSE  I reviewed the triage vital signs and the nursing notes.  Patient's presentation is most consistent with acute presentation with potential threat to life or bodily function.  Patient presents to the emergency department for a fall.  Overall the patient appears well no signs of injury on examination Grinage of motion in all extremities.  CT scan of the head and C-spine are negative.  Patient did miss her dialysis today last had dialysis on Monday.  As the patient did miss dialysis this morning we will check labs as a precaution to rule out hyperkalemia or other significant abnormality.  As long the patient's lab work does not show any significant finding we will have the patient follow-up with her outpatient dialysis center to resume  dialysis.  Family agreeable to plan of care.  Patient's lab work is reviewed during normal potassium of 4 point now.  Will discharge have the patient follow-up with her outpatient dialysis center.  Patient is somewhat agitated refusing to get on the stretcher for EMS to go home.  Patient has a history of dementia, daughter is trying to go worse her into getting on the stretcher.  We will dose 5 mg of IM Haldol to help relax the patient and to allow for safe transport back to her facility.  FINAL CLINICAL IMPRESSION(S) / ED DIAGNOSES   Fall   Note:  This document was prepared using Dragon voice recognition software and may include unintentional dictation errors.   Minna Antis, MD 01/17/23 1426    Minna Antis, MD 01/17/23 1704

## 2023-01-18 DIAGNOSIS — K219 Gastro-esophageal reflux disease without esophagitis: Secondary | ICD-10-CM | POA: Diagnosis not present

## 2023-01-18 DIAGNOSIS — E1122 Type 2 diabetes mellitus with diabetic chronic kidney disease: Secondary | ICD-10-CM | POA: Diagnosis not present

## 2023-01-18 DIAGNOSIS — R296 Repeated falls: Secondary | ICD-10-CM | POA: Diagnosis not present

## 2023-01-18 DIAGNOSIS — Z992 Dependence on renal dialysis: Secondary | ICD-10-CM | POA: Diagnosis not present

## 2023-01-18 DIAGNOSIS — M1990 Unspecified osteoarthritis, unspecified site: Secondary | ICD-10-CM | POA: Diagnosis not present

## 2023-01-18 DIAGNOSIS — I10 Essential (primary) hypertension: Secondary | ICD-10-CM | POA: Diagnosis not present

## 2023-01-18 DIAGNOSIS — E039 Hypothyroidism, unspecified: Secondary | ICD-10-CM | POA: Diagnosis not present

## 2023-01-18 DIAGNOSIS — N185 Chronic kidney disease, stage 5: Secondary | ICD-10-CM | POA: Diagnosis not present

## 2023-01-18 DIAGNOSIS — N189 Chronic kidney disease, unspecified: Secondary | ICD-10-CM | POA: Diagnosis not present

## 2023-01-18 DIAGNOSIS — I480 Paroxysmal atrial fibrillation: Secondary | ICD-10-CM | POA: Diagnosis not present

## 2023-01-18 DIAGNOSIS — M6281 Muscle weakness (generalized): Secondary | ICD-10-CM | POA: Diagnosis not present

## 2023-01-19 DIAGNOSIS — N2581 Secondary hyperparathyroidism of renal origin: Secondary | ICD-10-CM | POA: Diagnosis not present

## 2023-01-19 DIAGNOSIS — N186 End stage renal disease: Secondary | ICD-10-CM | POA: Diagnosis not present

## 2023-01-19 DIAGNOSIS — D509 Iron deficiency anemia, unspecified: Secondary | ICD-10-CM | POA: Diagnosis not present

## 2023-01-19 DIAGNOSIS — Z992 Dependence on renal dialysis: Secondary | ICD-10-CM | POA: Diagnosis not present

## 2023-01-22 DIAGNOSIS — N186 End stage renal disease: Secondary | ICD-10-CM | POA: Diagnosis not present

## 2023-01-22 DIAGNOSIS — Z992 Dependence on renal dialysis: Secondary | ICD-10-CM | POA: Diagnosis not present

## 2023-01-22 DIAGNOSIS — D509 Iron deficiency anemia, unspecified: Secondary | ICD-10-CM | POA: Diagnosis not present

## 2023-01-22 DIAGNOSIS — N2581 Secondary hyperparathyroidism of renal origin: Secondary | ICD-10-CM | POA: Diagnosis not present

## 2023-01-23 DIAGNOSIS — R079 Chest pain, unspecified: Secondary | ICD-10-CM | POA: Diagnosis not present

## 2023-01-23 DIAGNOSIS — I4891 Unspecified atrial fibrillation: Secondary | ICD-10-CM | POA: Diagnosis not present

## 2023-01-23 DIAGNOSIS — E1122 Type 2 diabetes mellitus with diabetic chronic kidney disease: Secondary | ICD-10-CM | POA: Diagnosis not present

## 2023-01-23 DIAGNOSIS — I10 Essential (primary) hypertension: Secondary | ICD-10-CM | POA: Diagnosis not present

## 2023-01-23 DIAGNOSIS — N185 Chronic kidney disease, stage 5: Secondary | ICD-10-CM | POA: Diagnosis not present

## 2023-01-23 DIAGNOSIS — K219 Gastro-esophageal reflux disease without esophagitis: Secondary | ICD-10-CM | POA: Diagnosis not present

## 2023-01-24 DIAGNOSIS — Z992 Dependence on renal dialysis: Secondary | ICD-10-CM | POA: Diagnosis not present

## 2023-01-24 DIAGNOSIS — N186 End stage renal disease: Secondary | ICD-10-CM | POA: Diagnosis not present

## 2023-01-24 DIAGNOSIS — N2581 Secondary hyperparathyroidism of renal origin: Secondary | ICD-10-CM | POA: Diagnosis not present

## 2023-01-24 DIAGNOSIS — D509 Iron deficiency anemia, unspecified: Secondary | ICD-10-CM | POA: Diagnosis not present

## 2023-01-25 DIAGNOSIS — Z0000 Encounter for general adult medical examination without abnormal findings: Secondary | ICD-10-CM | POA: Diagnosis not present

## 2023-01-26 DIAGNOSIS — N2581 Secondary hyperparathyroidism of renal origin: Secondary | ICD-10-CM | POA: Diagnosis not present

## 2023-01-26 DIAGNOSIS — D509 Iron deficiency anemia, unspecified: Secondary | ICD-10-CM | POA: Diagnosis not present

## 2023-01-26 DIAGNOSIS — N186 End stage renal disease: Secondary | ICD-10-CM | POA: Diagnosis not present

## 2023-01-26 DIAGNOSIS — Z992 Dependence on renal dialysis: Secondary | ICD-10-CM | POA: Diagnosis not present

## 2023-01-29 DIAGNOSIS — N2581 Secondary hyperparathyroidism of renal origin: Secondary | ICD-10-CM | POA: Diagnosis not present

## 2023-01-29 DIAGNOSIS — N186 End stage renal disease: Secondary | ICD-10-CM | POA: Diagnosis not present

## 2023-01-29 DIAGNOSIS — D509 Iron deficiency anemia, unspecified: Secondary | ICD-10-CM | POA: Diagnosis not present

## 2023-01-29 DIAGNOSIS — Z992 Dependence on renal dialysis: Secondary | ICD-10-CM | POA: Diagnosis not present

## 2023-01-30 ENCOUNTER — Other Ambulatory Visit: Payer: Self-pay | Admitting: *Deleted

## 2023-01-30 DIAGNOSIS — M6259 Muscle wasting and atrophy, not elsewhere classified, multiple sites: Secondary | ICD-10-CM | POA: Diagnosis not present

## 2023-01-30 DIAGNOSIS — I5032 Chronic diastolic (congestive) heart failure: Secondary | ICD-10-CM | POA: Diagnosis not present

## 2023-01-30 DIAGNOSIS — R2689 Other abnormalities of gait and mobility: Secondary | ICD-10-CM | POA: Diagnosis not present

## 2023-01-30 DIAGNOSIS — I480 Paroxysmal atrial fibrillation: Secondary | ICD-10-CM | POA: Diagnosis not present

## 2023-01-30 DIAGNOSIS — Z992 Dependence on renal dialysis: Secondary | ICD-10-CM | POA: Diagnosis not present

## 2023-01-30 DIAGNOSIS — Z741 Need for assistance with personal care: Secondary | ICD-10-CM | POA: Diagnosis not present

## 2023-01-30 DIAGNOSIS — J189 Pneumonia, unspecified organism: Secondary | ICD-10-CM | POA: Diagnosis not present

## 2023-01-30 DIAGNOSIS — M6281 Muscle weakness (generalized): Secondary | ICD-10-CM | POA: Diagnosis not present

## 2023-01-30 NOTE — Patient Outreach (Signed)
Per [facility] Ms. Bakare resides in [facility] skilled nursing facility. Screening for potential care coordination services as benefit of health plan and Primary Care Provider.   Collaboration with [employer] social worker. Aurther Loft reports Mrs. Selles will return home with daughter [name] on Friday, August 2nd. Will have [facility].   Telephone call made to Shawndra Slayback (daughter/DPR) [phone]. No answer. HIPAA compliant voicemail message left requesting return call.   Raiford Noble, MSN, RN,BSN Post Acute Care Coordinator [phone] (Direct dial)

## 2023-01-31 ENCOUNTER — Encounter (INDEPENDENT_AMBULATORY_CARE_PROVIDER_SITE_OTHER): Payer: Self-pay

## 2023-01-31 DIAGNOSIS — Z992 Dependence on renal dialysis: Secondary | ICD-10-CM | POA: Diagnosis not present

## 2023-01-31 DIAGNOSIS — N2581 Secondary hyperparathyroidism of renal origin: Secondary | ICD-10-CM | POA: Diagnosis not present

## 2023-01-31 DIAGNOSIS — D509 Iron deficiency anemia, unspecified: Secondary | ICD-10-CM | POA: Diagnosis not present

## 2023-01-31 DIAGNOSIS — N186 End stage renal disease: Secondary | ICD-10-CM | POA: Diagnosis not present

## 2023-02-02 DIAGNOSIS — I10 Essential (primary) hypertension: Secondary | ICD-10-CM | POA: Diagnosis not present

## 2023-02-02 DIAGNOSIS — E1122 Type 2 diabetes mellitus with diabetic chronic kidney disease: Secondary | ICD-10-CM | POA: Diagnosis not present

## 2023-02-02 DIAGNOSIS — R296 Repeated falls: Secondary | ICD-10-CM | POA: Diagnosis not present

## 2023-02-02 DIAGNOSIS — N186 End stage renal disease: Secondary | ICD-10-CM | POA: Diagnosis not present

## 2023-02-02 DIAGNOSIS — K219 Gastro-esophageal reflux disease without esophagitis: Secondary | ICD-10-CM | POA: Diagnosis not present

## 2023-02-02 DIAGNOSIS — I4891 Unspecified atrial fibrillation: Secondary | ICD-10-CM | POA: Diagnosis not present

## 2023-02-02 DIAGNOSIS — M6281 Muscle weakness (generalized): Secondary | ICD-10-CM | POA: Diagnosis not present

## 2023-02-02 DIAGNOSIS — N2581 Secondary hyperparathyroidism of renal origin: Secondary | ICD-10-CM | POA: Diagnosis not present

## 2023-02-02 DIAGNOSIS — Z992 Dependence on renal dialysis: Secondary | ICD-10-CM | POA: Diagnosis not present

## 2023-02-02 DIAGNOSIS — N185 Chronic kidney disease, stage 5: Secondary | ICD-10-CM | POA: Diagnosis not present

## 2023-02-05 ENCOUNTER — Other Ambulatory Visit: Payer: Self-pay | Admitting: *Deleted

## 2023-02-05 DIAGNOSIS — N2581 Secondary hyperparathyroidism of renal origin: Secondary | ICD-10-CM | POA: Diagnosis not present

## 2023-02-05 DIAGNOSIS — Z992 Dependence on renal dialysis: Secondary | ICD-10-CM | POA: Diagnosis not present

## 2023-02-05 DIAGNOSIS — I10 Essential (primary) hypertension: Secondary | ICD-10-CM

## 2023-02-05 DIAGNOSIS — N186 End stage renal disease: Secondary | ICD-10-CM | POA: Diagnosis not present

## 2023-02-05 NOTE — Patient Outreach (Signed)
Per [facility] Ms. Glymph discharged from [facility] skilled nursing facility on 02/02/23. Will have [facility]. Screening for potential care coordination services as benefit of health plan and Primary Care Provider.  Alanda Amass social worker previously reported [employer] will provide home health services.   Will refer for RN care coordination services.    Raiford Noble, MSN, RN,BSN [facility] Post Acute Care Coordinator [phone] (Direct dial)

## 2023-02-06 ENCOUNTER — Telehealth: Payer: Self-pay | Admitting: *Deleted

## 2023-02-06 DIAGNOSIS — Z7901 Long term (current) use of anticoagulants: Secondary | ICD-10-CM | POA: Diagnosis not present

## 2023-02-06 DIAGNOSIS — E1140 Type 2 diabetes mellitus with diabetic neuropathy, unspecified: Secondary | ICD-10-CM | POA: Diagnosis not present

## 2023-02-06 DIAGNOSIS — Z853 Personal history of malignant neoplasm of breast: Secondary | ICD-10-CM | POA: Diagnosis not present

## 2023-02-06 DIAGNOSIS — Z7984 Long term (current) use of oral hypoglycemic drugs: Secondary | ICD-10-CM | POA: Diagnosis not present

## 2023-02-06 DIAGNOSIS — D696 Thrombocytopenia, unspecified: Secondary | ICD-10-CM | POA: Diagnosis not present

## 2023-02-06 DIAGNOSIS — I480 Paroxysmal atrial fibrillation: Secondary | ICD-10-CM | POA: Diagnosis not present

## 2023-02-06 DIAGNOSIS — Z992 Dependence on renal dialysis: Secondary | ICD-10-CM | POA: Diagnosis not present

## 2023-02-06 DIAGNOSIS — H543 Unqualified visual loss, both eyes: Secondary | ICD-10-CM | POA: Diagnosis not present

## 2023-02-06 DIAGNOSIS — M1990 Unspecified osteoarthritis, unspecified site: Secondary | ICD-10-CM | POA: Diagnosis not present

## 2023-02-06 DIAGNOSIS — I5043 Acute on chronic combined systolic (congestive) and diastolic (congestive) heart failure: Secondary | ICD-10-CM | POA: Diagnosis not present

## 2023-02-06 DIAGNOSIS — E1122 Type 2 diabetes mellitus with diabetic chronic kidney disease: Secondary | ICD-10-CM | POA: Diagnosis not present

## 2023-02-06 DIAGNOSIS — I132 Hypertensive heart and chronic kidney disease with heart failure and with stage 5 chronic kidney disease, or end stage renal disease: Secondary | ICD-10-CM | POA: Diagnosis not present

## 2023-02-06 DIAGNOSIS — I700 Atherosclerosis of aorta: Secondary | ICD-10-CM | POA: Diagnosis not present

## 2023-02-06 DIAGNOSIS — M6259 Muscle wasting and atrophy, not elsewhere classified, multiple sites: Secondary | ICD-10-CM | POA: Diagnosis not present

## 2023-02-06 DIAGNOSIS — D631 Anemia in chronic kidney disease: Secondary | ICD-10-CM | POA: Diagnosis not present

## 2023-02-06 DIAGNOSIS — C50412 Malignant neoplasm of upper-outer quadrant of left female breast: Secondary | ICD-10-CM | POA: Diagnosis not present

## 2023-02-06 DIAGNOSIS — E11311 Type 2 diabetes mellitus with unspecified diabetic retinopathy with macular edema: Secondary | ICD-10-CM | POA: Diagnosis not present

## 2023-02-06 DIAGNOSIS — Z170 Estrogen receptor positive status [ER+]: Secondary | ICD-10-CM | POA: Diagnosis not present

## 2023-02-06 DIAGNOSIS — M810 Age-related osteoporosis without current pathological fracture: Secondary | ICD-10-CM | POA: Diagnosis not present

## 2023-02-06 DIAGNOSIS — M109 Gout, unspecified: Secondary | ICD-10-CM | POA: Diagnosis not present

## 2023-02-06 DIAGNOSIS — E890 Postprocedural hypothyroidism: Secondary | ICD-10-CM | POA: Diagnosis not present

## 2023-02-06 DIAGNOSIS — R1310 Dysphagia, unspecified: Secondary | ICD-10-CM | POA: Diagnosis not present

## 2023-02-06 DIAGNOSIS — I350 Nonrheumatic aortic (valve) stenosis: Secondary | ICD-10-CM | POA: Diagnosis not present

## 2023-02-06 DIAGNOSIS — K219 Gastro-esophageal reflux disease without esophagitis: Secondary | ICD-10-CM | POA: Diagnosis not present

## 2023-02-06 DIAGNOSIS — N186 End stage renal disease: Secondary | ICD-10-CM | POA: Diagnosis not present

## 2023-02-06 NOTE — Progress Notes (Signed)
  Care Coordination   Note   02/06/2023 Name: Alexis Tucker MRN: [id number] DOB: 10/07/1940  Alexis Tucker is a 82 y.o. year old female who sees Alexis Boyden, MD for primary care. I reached out to Alexis Tucker by phone today to offer care coordination services.  Alexis Tucker was given information about Care Coordination services today including:   The Care Coordination services include support from the care team which includes your Nurse Coordinator, Clinical Social Worker, or Pharmacist.  The Care Coordination team is here to help remove barriers to the health concerns and goals most important to you. Care Coordination services are voluntary, and the patient may decline or stop services at any time by request to their care team member.   Care Coordination Consent Status: Patient did not agree to participate in care coordination services at this time.  Follow up plan:  per pt daughter Alexis Braun, patient has HH/PT/OT coming weekly and declines services at this time. Says she will also discuss this at upcoming provider appt   Encounter Outcome:  Pt. Refused  Burman Nieves, CCMA Care Coordination Care Guide Direct Dial: [phone]

## 2023-02-07 ENCOUNTER — Telehealth: Payer: Self-pay | Admitting: Family Medicine

## 2023-02-07 NOTE — Telephone Encounter (Signed)
Spoke with Leta Jungling informing her Dr. Reece Agar is giving verbal orders requested. She verbalizes understanding and will notify Cindy.

## 2023-02-07 NOTE — Telephone Encounter (Signed)
Agree with this. Thanks.  

## 2023-02-07 NOTE — Telephone Encounter (Signed)
Home Health verbal orders Caller Name: Dwyane Dee Director  Agency Name: [facility]   Callback number: [phone] option 2  Requesting OT/PT/Skilled nursing/Social Work/Speech: Physical Therapy Evaluation next week   Reason: Originally had one scheduled this week, need it moved to this week due to staffing   Frequency:  Please forward to [facility] pool or providers CMA

## 2023-02-08 DIAGNOSIS — D631 Anemia in chronic kidney disease: Secondary | ICD-10-CM | POA: Diagnosis not present

## 2023-02-08 DIAGNOSIS — I132 Hypertensive heart and chronic kidney disease with heart failure and with stage 5 chronic kidney disease, or end stage renal disease: Secondary | ICD-10-CM | POA: Diagnosis not present

## 2023-02-08 DIAGNOSIS — I480 Paroxysmal atrial fibrillation: Secondary | ICD-10-CM | POA: Diagnosis not present

## 2023-02-08 DIAGNOSIS — I5043 Acute on chronic combined systolic (congestive) and diastolic (congestive) heart failure: Secondary | ICD-10-CM | POA: Diagnosis not present

## 2023-02-08 DIAGNOSIS — N186 End stage renal disease: Secondary | ICD-10-CM | POA: Diagnosis not present

## 2023-02-08 DIAGNOSIS — E1122 Type 2 diabetes mellitus with diabetic chronic kidney disease: Secondary | ICD-10-CM | POA: Diagnosis not present

## 2023-02-09 ENCOUNTER — Telehealth: Payer: Self-pay | Admitting: Family Medicine

## 2023-02-09 ENCOUNTER — Encounter: Payer: Self-pay | Admitting: Cardiology

## 2023-02-09 DIAGNOSIS — N186 End stage renal disease: Secondary | ICD-10-CM | POA: Diagnosis not present

## 2023-02-09 DIAGNOSIS — N2581 Secondary hyperparathyroidism of renal origin: Secondary | ICD-10-CM | POA: Diagnosis not present

## 2023-02-09 DIAGNOSIS — Z992 Dependence on renal dialysis: Secondary | ICD-10-CM | POA: Diagnosis not present

## 2023-02-09 NOTE — Telephone Encounter (Signed)
Noted pt declined HHOT services.

## 2023-02-09 NOTE — Telephone Encounter (Signed)
Esther from [facility] called to report that patient declined OT services

## 2023-02-12 DIAGNOSIS — N186 End stage renal disease: Secondary | ICD-10-CM | POA: Diagnosis not present

## 2023-02-12 DIAGNOSIS — N2581 Secondary hyperparathyroidism of renal origin: Secondary | ICD-10-CM | POA: Diagnosis not present

## 2023-02-12 DIAGNOSIS — Z992 Dependence on renal dialysis: Secondary | ICD-10-CM | POA: Diagnosis not present

## 2023-02-13 ENCOUNTER — Inpatient Hospital Stay: Payer: Medicare Other | Admitting: Family Medicine

## 2023-02-13 ENCOUNTER — Encounter: Payer: Self-pay | Admitting: Family Medicine

## 2023-02-13 ENCOUNTER — Ambulatory Visit (INDEPENDENT_AMBULATORY_CARE_PROVIDER_SITE_OTHER): Payer: Medicare Other | Admitting: Family Medicine

## 2023-02-13 ENCOUNTER — Ambulatory Visit (INDEPENDENT_AMBULATORY_CARE_PROVIDER_SITE_OTHER): Payer: Medicare Other

## 2023-02-13 VITALS — BP 132/68 | HR 76 | Temp 97.3°F | Ht 63.0 in | Wt 156.4 lb

## 2023-02-13 VITALS — BP 124/76 | Ht 63.0 in | Wt 156.0 lb

## 2023-02-13 DIAGNOSIS — Z0000 Encounter for general adult medical examination without abnormal findings: Secondary | ICD-10-CM | POA: Diagnosis not present

## 2023-02-13 DIAGNOSIS — E1169 Type 2 diabetes mellitus with other specified complication: Secondary | ICD-10-CM

## 2023-02-13 DIAGNOSIS — E785 Hyperlipidemia, unspecified: Secondary | ICD-10-CM

## 2023-02-13 DIAGNOSIS — I480 Paroxysmal atrial fibrillation: Secondary | ICD-10-CM | POA: Diagnosis not present

## 2023-02-13 DIAGNOSIS — R413 Other amnesia: Secondary | ICD-10-CM | POA: Diagnosis not present

## 2023-02-13 DIAGNOSIS — Z780 Asymptomatic menopausal state: Secondary | ICD-10-CM

## 2023-02-13 DIAGNOSIS — D696 Thrombocytopenia, unspecified: Secondary | ICD-10-CM

## 2023-02-13 DIAGNOSIS — F321 Major depressive disorder, single episode, moderate: Secondary | ICD-10-CM

## 2023-02-13 DIAGNOSIS — E611 Iron deficiency: Secondary | ICD-10-CM

## 2023-02-13 DIAGNOSIS — I10 Essential (primary) hypertension: Secondary | ICD-10-CM | POA: Diagnosis not present

## 2023-02-13 DIAGNOSIS — M810 Age-related osteoporosis without current pathological fracture: Secondary | ICD-10-CM | POA: Diagnosis not present

## 2023-02-13 DIAGNOSIS — I5033 Acute on chronic diastolic (congestive) heart failure: Secondary | ICD-10-CM | POA: Diagnosis not present

## 2023-02-13 DIAGNOSIS — R768 Other specified abnormal immunological findings in serum: Secondary | ICD-10-CM

## 2023-02-13 DIAGNOSIS — N186 End stage renal disease: Secondary | ICD-10-CM | POA: Diagnosis not present

## 2023-02-13 DIAGNOSIS — D638 Anemia in other chronic diseases classified elsewhere: Secondary | ICD-10-CM | POA: Diagnosis not present

## 2023-02-13 DIAGNOSIS — J189 Pneumonia, unspecified organism: Secondary | ICD-10-CM

## 2023-02-13 MED ORDER — LINAGLIPTIN 5 MG PO TABS: 5.0000 mg | ORAL_TABLET | Freq: Every day | ORAL | 3 refills | Status: DC

## 2023-02-13 MED ORDER — APIXABAN 2.5 MG PO TABS: 2.5000 mg | ORAL_TABLET | Freq: Two times a day (BID) | ORAL | 3 refills | Status: DC

## 2023-02-13 MED ORDER — SERTRALINE HCL 25 MG PO TABS: 25.0000 mg | ORAL_TABLET | Freq: Every day | ORAL | 3 refills | Status: DC

## 2023-02-13 MED ORDER — LEVOTHYROXINE SODIUM 88 MCG PO TABS: 88.0000 ug | ORAL_TABLET | Freq: Every day | ORAL | 3 refills | Status: DC

## 2023-02-13 NOTE — Patient Instructions (Addendum)
I'm glad you're doing well. Continue current medicines.  Consider low dose cholesterol medicine.  Keep phone call later today.  Return in 6 months for follow up visit

## 2023-02-13 NOTE — Progress Notes (Signed)
 Because this visit was a virtual/telehealth visit,  certain criteria was not obtained, such a blood pressure, CBG if patient is a diabetic, and timed up and go. Any medications not marked as "taking" was not mentioned during the medication reconciliation part of the visit. Any vitals not documented were not able to be obtained due to this being a telehealth visit. Vitals documented are verbally provided by the patient.  Visit completed with the help of patients daughter, Alexis Tucker.  Subjective:   Alexis Tucker is a 82 y.o. female who presents for Medicare Annual (Subsequent) preventive examination.  Visit Complete: Virtual  I connected with  Alexis Tucker on 02/13/23 by a audio enabled telemedicine application and verified that I am speaking with the correct person using two identifiers.  Patient Location: Home  Provider Location: Home Office  I discussed the limitations of evaluation and management by telemedicine. The patient expressed understanding and agreed to proceed.  Patient Medicare AWV questionnaire was completed by the patient on n/a; I have confirmed that all information answered by patient is correct and no changes since this date.  Review of Systems     Cardiac Risk Factors include: advanced age (>44men, >18 women);dyslipidemia;hypertension;sedentary lifestyle;Other (see comment), Risk factor comments: cardiovascular disease     Objective:    Today's Vitals   02/13/23 1302  BP: 124/76  Weight: 156 lb (70.8 kg)  Height: 5\' 3"  (1.6 m)   Body mass index is 27.63 kg/m.     02/13/2023    1:02 PM 12/05/2022    4:09 PM 12/03/2022   12:03 PM 10/13/2022    8:00 PM 05/18/2022    8:33 AM 04/24/2022    6:00 PM 04/22/2022   11:58 AM  Advanced Directives  Does Patient Have a Medical Advance Directive? No Yes Yes No Yes Yes Yes  Type of Science writer of [place];Living will  Living will;Healthcare Power of [employer] Power of [employer] Power of [place];Living will  Does patient want to make changes to medical advance directive?  No - Guardian declined    No - Patient declined   Copy of Healthcare Power of Attorney in Chart?  Yes - validated most recent copy scanned in chart (See row information)    Yes - validated most recent copy scanned in chart (See row information)   Would patient like information on creating a medical advance directive? No - Patient declined   No - Patient declined       Current Medications (verified) Outpatient Encounter Medications as of 02/13/2023  Medication Sig   allopurinol (ZYLOPRIM) 100 MG tablet Take 1 tablet (100 mg total) by mouth daily. For gout   amLODipine (NORVASC) 10 MG tablet Take 10 mg by mouth daily.   anastrozole (ARIMIDEX) 1 MG tablet TAKE 1 TABLET BY MOUTH EVERY DAY   apixaban (ELIQUIS) 2.5 MG TABS tablet Take 1 tablet (2.5 mg total) by mouth 2 (two) times daily. As blood thinner   Blood Glucose Monitoring Suppl (ACURA BLOOD GLUCOSE METER) W/DEVICE KIT Use as directed to check sugars daily 250.42   carvedilol (COREG) 25 MG tablet Take 0.5 tablets (12.5 mg total) by mouth 2 (two) times daily with a meal.   Cholecalciferol (VITAMIN D-3) 5000 UNITS TABS Take 1 tablet by mouth daily.   cloNIDine HCl (KAPVAY) 0.1 MG TB12 ER tablet Take 0.1 mg by mouth 2 (two) times daily. PRN   isosorbide mononitrate (IMDUR) 30 MG 24 hr tablet  Take 1 tablet (30 mg total) by mouth daily.   levothyroxine (SYNTHROID) 88 MCG tablet Take 1 tablet (88 mcg total) by mouth daily. For hypothyroidism   linagliptin (TRADJENTA) 5 MG TABS tablet Take 1 tablet (5 mg total) by mouth daily.   pantoprazole (PROTONIX) 40 MG tablet Take 1 tablet (40 mg total) by mouth daily as needed. For reflux   sertraline (ZOLOFT) 25 MG tablet Take 1 tablet (25 mg total) by mouth daily.   sodium bicarbonate 650 MG tablet Take 2 tablets (1,300 mg total) by mouth 2 (two) times daily. Through  dialysis   No facility-administered encounter medications on file as of 02/13/2023.    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   a.) Avastin injections as [facility]   DJD (degenerative joint disease)    ESRD (end stage renal disease) (HCC)    GERD (gastroesophageal reflux disease)    Glaucoma 03/2014   History of colon cancer 2006   a.) s/p colectomy   History of hepatitis A 1990s   from food, resolved   HTN (hypertension)    Hyperplastic colon polyp    Long term current use of anticoagulant    a.) apixaban   Long  term current use of antithrombotics/antiplatelets    a.) pentoxifylline   Macular degeneration    wet R with vision loss, dry L; to see retina specialist   Neuropathy    OA (osteoarthritis)    Osteonecrosis (HCC)    hips?   PONV (postoperative nausea and vomiting)    Post-surgical hypothyroidism    Thrombocytopenia (HCC)    Tubular adenoma    Wears dentures    partial upper   Past Surgical History:  Procedure Laterality Date   A/V FISTULAGRAM Right 06/08/2022   Procedure: A/V Fistulagram;  Surgeon: Annice Needy, MD;  Location: [facility];  Service: Cardiovascular;  Laterality: Right;   ABDOMINAL HYSTERECTOMY  1976   partial, after tubal pregnancy, h/o fibroids with heavy bleeding   AV FISTULA PLACEMENT Right 02/01/2022   Procedure: INSERTION OF ARTERIOVENOUS (AV) GORE-TEX GRAFT ARM (LEFT UPPER EXTREMITY);  Surgeon: Annice Needy, MD;  Location: [facility] ORS;  Service: Vascular;  Laterality: Right;   BREAST BIOPSY Left 2008   positive   BREAST BIOPSY Left 11/22/2017   2oclock 2cmfn wing shaped clip, INVASIVE MAMMARY CARCINOMA   BREAST BIOPSY Left 11/22/2017   2oclock 4cmfn coil shaped clip, INVASIVE MAMMARY CARCINOMA   BREAST BIOPSY Left 11/22/2017   lymph node - butterfly hydroMARK   BREAST EXCISIONAL BIOPSY Left 01/07/2018   lumpectomy by Dr. Lemar Livings   BREAST LUMPECTOMY Left 2008   F/U radiation   BREAST LUMPECTOMY Left 2019   2 areas of [facility] at 2:00 position f/u with mammosite   BREAST LUMPECTOMY WITH SENTINEL LYMPH NODE BIOPSY Left 01/07/2018   Procedure: BREAST LUMPECTOMY WITH SENTINEL LYMPH NODE BX;  Surgeon: Earline Mayotte, MD;  Location: [facility] OR[facility];  Service: General;  Laterality: Left;   BREAST MAMMOSITE  2008   carotid ultrasound  12/2012   1-39% bilateral stenosis, severe in ECA   CATARACT EXTRACTION W/PHACO Left 08/14/2016   Procedure: CATARACT EXTRACTION PHACO AND INTRAOCULAR LENS PLACEMENT (IOC)  Left Diabetic;  Surgeon: Sherald Hess, MD;   Location: [facility];  Service: Ophthalmology;  Laterality: Left;  Diabetic - oral meds   CHOLECYSTECTOMY  2005   COLECTOMY  2005   colon cancer   COLON SURGERY  2006   bowel obstruction   COLONOSCOPY  2008   ext hem, ileo-colonic anastomosis in cecum, tubular adenoma x1, rec rpt 1 yr for multiple polyps (in [place])   COLONOSCOPY WITH PROPOFOL N/A 10/19/2021   2.5cm TVA with low grade dysplasia, diverticulosis, no rpt recommended Wyline Mood, MD)   DIALYSIS/PERMA CATHETER INSERTION N/A 12/08/2022   Procedure: DIALYSIS/PERMA CATHETER INSERTION;  Surgeon: Renford Dills, MD;  Location: [facility];  Service: Cardiovascular;  Laterality: N/A;   ESOPHAGOGASTRODUODENOSCOPY N/A 05/06/2016   Procedure: ESOPHAGOGASTRODUODENOSCOPY (EGD);  Surgeon: Scot Jun, MD;  Location: [facility];  Service: Endoscopy;  Laterality: N/A;   EYE SURGERY     JOINT REPLACEMENT     THYROIDECTOMY, PARTIAL     unclear why   TOTAL KNEE ARTHROPLASTY Right 1999   UPPER EXTREMITY ANGIOGRAPHY Right 05/18/2022   Procedure: Upper Extremity Angiography;  Surgeon: Annice Needy, MD;  Location: [facility];  Service: Cardiovascular;  Laterality: Right;   US ECHOCARDIOGRAPHY  11/2007   nl LV fxn EF 60%, diastolic dysfunction, LVH, tr TR and MR, slcerotic aortic valve   VEIN LIGATION AND STRIPPING     Family History  Problem Relation Age of Onset   Diabetes Mother    Hypertension Mother    Arthritis Mother    Alcohol abuse Father    Arthritis Father    Stroke Daughter    CAD Neg Hx    Cancer Neg Hx    Breast cancer Neg Hx    Social History   Socioeconomic History   Marital status: Divorced    Spouse name: Not on file   Number of children: 1   Years of education: Not on file   Highest education level: Not on file  Occupational History   Not on file  Tobacco Use   Smoking status: Former    Passive exposure: Past   Smokeless tobacco: Never   Tobacco comments:    quit over 40  yrs ago  Vaping Use   Vaping status: Never Used  Substance and Sexual Activity   Alcohol use: Never    Comment: rare   Drug  use: Never   Sexual activity: Not Currently  Other Topics Concern   Not on file  Social History Narrative   Lives alone,   Granddaughter in [place] (attorney).   Divorced   Occu: retired, worked in Consulting civil engineer   Edu: college   Has 1 living daughter. (One passed)   Social Determinants of Health   Financial Resource Strain: Low Risk  (02/13/2023)   Overall Financial Resource Strain (CARDIA)    Difficulty of Paying Living Expenses: Not hard at all  Food Insecurity: No Food Insecurity (02/13/2023)   Hunger Vital Sign    Worried About Running Out of Food in the Last Year: Never true    Ran Out of Food in the Last Year: Never true  Transportation Needs: No Transportation Needs (02/13/2023)   PRAPARE - Administrator, Civil Service (Medical): No    Lack of Transportation (Non-Medical): No  Physical Activity: Inactive (02/13/2023)   Exercise Vital Sign    Days of Exercise per Week: 0 days    Minutes of Exercise per Session: 0 min  Stress: No Stress Concern Present (02/13/2023)   Alexis Tucker of Occupational Health - Occupational Stress Questionnaire    Feeling of Stress : Not at all  Social Connections: Socially Isolated (02/13/2023)   Social Connection and Isolation Panel [NHANES]    Frequency of Communication with Friends and Family: More than three times a week    Frequency of Social Gatherings with Friends and Family: More than three times a week    Attends Religious Services: Never    Database administrator or Organizations: No    Attends Engineer, structural: Never    Marital Status: Divorced    Tobacco Counseling Counseling given: Yes Tobacco comments: quit over 40 yrs ago   Clinical Intake:  Pre-visit preparation completed: Yes  Pain : No/denies pain     BMI - recorded: 27.63 Nutritional Status: BMI 25 -29  Overweight Nutritional Risks: None Diabetes: No  How often do you need to have someone help you when you read instructions, pamphlets, or other written materials from your doctor or pharmacy?: 1 - Never  Interpreter Needed?: No  Information entered by ::  , CMA   Activities of Daily Living    02/13/2023    1:18 PM 12/05/2022    4:09 PM  In your present state of health, do you have any difficulty performing the following activities:  Hearing? 0 0  Vision? 0 1  Difficulty concentrating or making decisions? 0 0  Walking or climbing stairs? 1 1  Comment uses walker   Dressing or bathing? 0 1  Doing errands, shopping? 1 0  Comment patient's daughter takes her to complete her errands   Preparing Food and eating ? Y   Comment patient only fixes food that does not require a stove to make.   Using the Toilet? N   In the past six months, have you accidently leaked urine? N   Do you have problems with loss of bowel control? N   Managing your Medications? Y   Comment daughter manages patients medications   Managing your Finances? Y   Comment daughter pays patients bills for her   Housekeeping or managing your Housekeeping? Y   Comment family members help patient with housekeeping     Patient Care Team: Eustaquio Boyden, MD as PCP - General (Family Medicine) Debbe Odea, MD as PCP - Cardiology (Cardiology) Earna Coder, MD as Consulting Physician (Oncology) Verden,  Garry Heater, RPH (Inactive) as Pharmacist (Pharmacist) Mosetta Pigeon, MD (Nephrology) Wyn Quaker Marlow Baars, MD as Referring Physician (Vascular Surgery)  Indicate any recent Medical Services you may have received from other than [facility] providers in the past year (date may be approximate).     Assessment:   This is a routine wellness examination for Alexis Tucker.  Hearing/Vision screen Hearing Screening - Comments:: Patient denies any hearing difficulties.   Vision Screening - Comments:: Patient sees a  retinal specialist. Dr. Shelah Lewandowsky  Dietary issues and exercise activities discussed:     Goals Addressed             This Visit's Progress    Patient Stated       "Remain active"       Depression Screen    02/13/2023    1:10 PM 02/13/2023   11:28 AM 09/25/2022   12:06 PM 12/28/2021   12:46 PM 06/18/2020    9:08 AM 12/25/2018   12:39 PM 01/28/2018    2:14 PM  PHQ 2/9 Scores  PHQ - 2 Score 0 2 0 0 0 0 0  PHQ- 9 Score 0 4 3   0     Fall Risk    02/13/2023    1:16 PM 02/13/2023   11:28 AM 09/25/2022   12:05 PM 02/08/2022   11:30 AM 12/28/2021   12:46 PM  Fall Risk   Falls in the past year? 1 1 0 0 0  Number falls in past yr: 0 0   0  Injury with Fall? 1 0   0  Risk for fall due to : Impaired balance/gait;Impaired mobility    Impaired balance/gait;Medication side effect  Follow up Education provided;Falls prevention discussed    Falls evaluation completed;Education provided;Falls prevention discussed    MEDICARE RISK AT HOME:  Medicare Risk at Home - 02/13/23 1313     Any stairs in or around the home? Yes    If so, are there any without handrails? No    Home free of loose throw rugs in walkways, pet beds, electrical cords, etc? Yes    Adequate lighting in your home to reduce risk of falls? Yes    Life alert? No    Use of a cane, walker or w/c? Yes    Grab bars in the bathroom? Yes    Shower chair or bench in shower? Yes    Elevated toilet seat or a handicapped toilet? No             TIMED UP AND GO:  Was the test performed?  No    Cognitive Function:    12/25/2018    1:06 PM 11/02/2016    3:00 PM  MMSE - Mini Mental State Exam  Orientation to time 5 5  Orientation to Place 5 5  Registration 3 3  Attention/ Calculation 0 0  Recall 3 3  Language- name 2 objects 0 0  Language- repeat 1 1  Language- follow 3 step command 0 3  Language- read & follow direction 0 0  Write a sentence 0 0  Copy design 0 0  Total score 17 20        02/13/2023     1:07 PM 12/28/2021   12:48 PM  6CIT Screen  What Year? 4 points 4 points  What month? 3 points 0 points  What time? 0 points 3 points  Count back from 20 0 points 0 points  Months in reverse 4 points 4 points  Repeat phrase 8 points 10 points  Total Score 19 points 21 points    Immunizations Immunization History  Administered Date(s) Administered   Fluad Quad(high Dose 65+) 03/20/2020, 05/17/2022   Influenza, High Dose Seasonal PF 05/26/2017, 04/23/2018, 02/25/2019, 02/25/2019, 04/05/2021   Influenza,inj,Quad PF,6+ Mos 03/13/2014, 07/27/2015   Influenza-Unspecified 05/26/2017   PFIZER(Purple Top)SARS-COV-2 Vaccination 08/07/2019, 08/28/2019, 06/08/2020   Pfizer Covid-19 Vaccine Bivalent Booster 40yrs & up 05/06/2021   Pneumococcal Conjugate-13 03/13/2014   Pneumococcal Polysaccharide-23 07/03/2009    TDAP status: Due, Education has been provided regarding the importance of this vaccine. Advised may receive this vaccine at local pharmacy or Health Dept. Aware to provide a copy of the vaccination record if obtained from local pharmacy or Health Dept. Verbalized acceptance and understanding.  Flu Vaccine status: Due, Education has been provided regarding the importance of this vaccine. Advised may receive this vaccine at local pharmacy or Health Dept. Aware to provide a copy of the vaccination record if obtained from local pharmacy or Health Dept. Verbalized acceptance and understanding.  Pneumococcal vaccine status: Up to date  Covid-19 vaccine status: Information provided on how to obtain vaccines.   Qualifies for Shingles Vaccine? Yes   Zostavax completed No   Shingrix Completed?: No.    Education has been provided regarding the importance of this vaccine. Patient has been advised to call insurance company to determine out of pocket expense if they have not yet received this vaccine. Advised may also receive vaccine at local pharmacy or Health Dept. Verbalized acceptance and  understanding.  Screening Tests Health Maintenance  Topic Date Due   FOOT EXAM  Never done   Zoster Vaccines- Shingrix (1 of 2) Never done   OPHTHALMOLOGY EXAM  06/07/2019   COVID-19 Vaccine (5 - 2023-24 season) 03/03/2022   Medicare Annual Wellness (AWV)  12/29/2022   INFLUENZA VACCINE  02/01/2023   DTaP/Tdap/Td (1 - Tdap) 11/03/2026 (Originally 03/16/1960)   HEMOGLOBIN A1C  03/28/2023   MAMMOGRAM  10/04/2023   Pneumonia Vaccine 48+ Years old  Completed   DEXA SCAN  Completed   Hepatitis C Screening  Completed   HPV VACCINES  Aged Out   Colonoscopy  Discontinued    Health Maintenance  Health Maintenance Due  Topic Date Due   FOOT EXAM  Never done   Zoster Vaccines- Shingrix (1 of 2) Never done   OPHTHALMOLOGY EXAM  06/07/2019   COVID-19 Vaccine (5 - 2023-24 season) 03/03/2022   Medicare Annual Wellness (AWV)  12/29/2022   INFLUENZA VACCINE  02/01/2023    Colorectal cancer screening: Type of screening: Colonoscopy. Completed 10/19/2021. Repeat every discontinued due to patient's age at the time of last procedure years  Mammogram status: Completed 10/04/2022. Repeat every year  Bone Density status: Completed 09/25/2017. Results reflect: Bone density results: OSTEOPOROSIS. Repeat every 2 years. Repeat bone density scan ordered today. Patient in agreement with treatment plan.  Lung Cancer Screening: (Low Dose CT Chest recommended if Age 64-80 years, 20 pack-year currently smoking OR have quit w/in 15years.) does not qualify.    Additional Screening:  Hepatitis C Screening: does not qualify; Completed 12/10/2022  Vision Screening: Recommended annual ophthalmology exams for early detection of glaucoma and other disorders of the eye. Is the patient up to date with their annual eye exam?  Yes  Who is the provider or what is the name of the office in which the patient attends annual eye exams? [facility] If pt is not established with a provider, would they like to be  referred to a  provider to establish care? No .   Dental Screening: Recommended annual dental exams for proper oral hygiene  Diabetic Foot Exam: Diabetic Foot Exam: Overdue, Pt has been advised about the importance in completing this exam. Pt is scheduled for diabetic foot exam on provider notified.  Community Resource Referral / Chronic Care Management: CRR required this visit?  No   CCM required this visit?  No     Plan:     I have personally reviewed and noted the following in the patient's chart:   Medical and social history Use of alcohol, tobacco or illicit drugs  Current medications and supplements including opioid prescriptions. Patient is not currently taking opioid prescriptions. Functional ability and status Nutritional status Physical activity Advanced directives List of other physicians Hospitalizations, surgeries, and ER visits in previous 12 months Vitals Screenings to include cognitive, depression, and falls Referrals and appointments  In addition, I have reviewed and discussed with patient certain preventive protocols, quality metrics, and best practice recommendations. A written personalized care plan for preventive services as well as general preventive health recommendations were provided to patient.     Jordan Hawks , CMA   02/13/2023   After Visit Summary: (MyChart) Due to this being a telephonic visit, the after visit summary with patients personalized plan was offered to patient via MyChart   Nurse Notes: Patient is due for a diabetic foot exam. Patient and her daughter made aware of vaccines needed.

## 2023-02-13 NOTE — Progress Notes (Unsigned)
Ph: [phone] Fax: [phone]   Patient ID: Alexis Tucker, female    DOB: 31-Aug-1940, 82 y.o.   MRN: [id number]  This visit was conducted in person.  BP 132/68   Pulse 76   Temp (!) 97.3 F (36.3 C) (Temporal)   Ht 5\' 3"  (1.6 m)   Wt 156 lb 6 oz (70.9 kg)   SpO2 94%   BMI 27.70 kg/m    CC: hosp f/u visit  Subjective:   HPI: Alexis Tucker is a 82 y.o. female presenting on 02/13/2023 for Hospitalization Follow-up (Seen on 01/17/23 at [facility] ED, dx fall. Also, pt provided copies [to keep] of OV notes and results of recent lab work. Pt accompanied by daughter, Alexis Tucker. )   Saw health advisor today for medicare wellness visit. Note reviewed.  Failed cognitive assessment.    02/13/2023    1:07 PM 12/28/2021   12:48 PM  6CIT Screen  What Year? 4 points 4 points  What month? 3 points 0 points  What time? 0 points 3 points  Count back from 20 0 points 0 points  Months in reverse 4 points 4 points  Repeat phrase 8 points 10 points  Total Score 19 points 21 points     Recent hospitalization for labored breathing, dyspnea, hypoxia - found to have multifocal pneumonia and acute on chronic dCHF causing acute respiratory failure with hypoxia. Treated with lasix, antibiotic course (azithromycin, ceftin).  Hospital records reviewed. Med rec performed.  Eliquis dose decreased to 2.5mg  bid.   Per discharge note: 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 Hepatitis viral load negative. Patient likely had exposure to hepatitis B in the past and now immune. Hepatitis C negative.   Recently started hemodialysis on 12/08/2022, receives MWF ([place]) - at [facility].   Subsequent ER visit 01/17/2023 for fall with head injury in setting of missing dialysis sessions due to pt refusal. CT C-spine and head reassuringly negative for acute process.   Stayed in [facility] for rehab through 02/02/2023.  She missed neurology appointment last  month. Daughter notices that memory may be improving since starting dialysis. They will reschedule neuro appt.   Received IV iron through dialysis yesterday.   Met with pharmacist - rec discontinue pentoxifylline  Home health set up with [facility] - HHRN, PT, health aide - no need for OT services.  Requests narrow walker for home use Other follow up appointments scheduled: sees nephrology in dialysis - next appt is Monday.  ______________________________________________________________________ Hospital admission: 12/03/2022 Hospital discharge: 12/15/2022 Discharge from facility: 02/02/2023 TCM f/u phone call: not performed   Admitted From: Home Disposition:  SNF    Recommendations for Outpatient Follow-up:  Follow-up with PCP, patient should be seen by an MD in 1 to 2 days, monitor blood pressure and titrate medication accordingly. Follow with nephrology and continue hemodialysis Monday, Wednesday and Friday schedule. Follow up LABS/TEST:    Discharge Diagnoses:  Principal Problem:   ESRD (end stage renal disease) (HCC) Active Problems:   Acute respiratory failure with hypoxia (HCC)   Acute on chronic diastolic CHF (congestive heart failure) (HCC)   Multifocal pneumonia   Paroxysmal atrial fibrillation (HCC)   Post-surgical hypothyroidism   Type 2 diabetes mellitus with other specified complication (HCC)   HTN (hypertension)   Abdominal pain   GERD (gastroesophageal reflux disease)   Thrombocytopenia (HCC)   OA (osteoarthritis)   Diabetic retinopathy with macular edema (HCC)   History of arterial ischemic  stroke   Osteoporosis   Malignant neoplasm of upper-outer quadrant of left breast in female, estrogen receptor positive (HCC)   DNR (do not resuscitate)   Anemia of chronic disease  Diet recommendation: Renal diet Activity: The patient is advised to gradually reintroduce usual activities, as tolerated Discharge Condition: stable Code Status: DNR      Relevant past  medical, surgical, family and social history reviewed and updated as indicated. Interim medical history since our last visit reviewed. Allergies and medications reviewed and updated. Outpatient Medications Prior to Visit  Medication Sig Dispense Refill   allopurinol (ZYLOPRIM) 100 MG tablet Take 1 tablet (100 mg total) by mouth daily. For gout 90 tablet 4   amLODipine (NORVASC) 10 MG tablet Take 10 mg by mouth daily.     anastrozole (ARIMIDEX) 1 MG tablet TAKE 1 TABLET BY MOUTH EVERY DAY 90 tablet 1   Blood Glucose Monitoring Suppl (ACURA BLOOD GLUCOSE METER) W/DEVICE KIT Use as directed to check sugars daily 250.42 1 kit 0   carvedilol (COREG) 25 MG tablet Take 0.5 tablets (12.5 mg total) by mouth 2 (two) times daily with a meal. 30 tablet 2   Cholecalciferol (VITAMIN D-3) 5000 UNITS TABS Take 1 tablet by mouth daily.     cloNIDine HCl (KAPVAY) 0.1 MG TB12 ER tablet Take 0.1 mg by mouth 2 (two) times daily. PRN     isosorbide mononitrate (IMDUR) 30 MG 24 hr tablet Take 1 tablet (30 mg total) by mouth daily. 90 tablet 3   pantoprazole (PROTONIX) 40 MG tablet Take 1 tablet (40 mg total) by mouth daily as needed. For reflux 90 tablet 4   apixaban (ELIQUIS) 2.5 MG TABS tablet Take 1 tablet (2.5 mg total) by mouth 2 (two) times daily. As blood thinner     levothyroxine (SYNTHROID) 88 MCG tablet Take 1 tablet (88 mcg total) by mouth daily. For hypothyroidism 90 tablet 4   linagliptin (TRADJENTA) 5 MG TABS tablet Take 1 tablet (5 mg total) by mouth daily. 30 tablet 11   sertraline (ZOLOFT) 25 MG tablet Take 25 mg by mouth daily.     sodium bicarbonate 650 MG tablet Take 1,300 mg by mouth 2 (two) times daily.     sodium bicarbonate 650 MG tablet Take 2 tablets (1,300 mg total) by mouth 2 (two) times daily. Through dialysis     No facility-administered medications prior to visit.     Per HPI unless specifically indicated in ROS section below Review of Systems  Objective:  BP 132/68   Pulse 76    Temp (!) 97.3 F (36.3 C) (Temporal)   Ht 5\' 3"  (1.6 m)   Wt 156 lb 6 oz (70.9 kg)   SpO2 94%   BMI 27.70 kg/m   Wt Readings from Last 3 Encounters:  02/13/23 156 lb (70.8 kg)  02/13/23 156 lb 6 oz (70.9 kg)  01/17/23 169 lb (76.7 kg)      Physical Exam Vitals and nursing note reviewed.  Constitutional:      Appearance: Normal appearance.     Comments: Sitting in wheelchair  HENT:     Head: Normocephalic and atraumatic.     Mouth/Throat:     Mouth: Mucous membranes are moist.     Pharynx: Oropharynx is clear. No oropharyngeal exudate or posterior oropharyngeal erythema.  Eyes:     Extraocular Movements: Extraocular movements intact.     Pupils: Pupils are equal, round, and reactive to light.  Cardiovascular:     Rate  and Rhythm: Normal rate and regular rhythm.     Pulses: Normal pulses.     Heart sounds: Normal heart sounds. No murmur heard. Pulmonary:     Effort: Pulmonary effort is normal. No respiratory distress.     Breath sounds: Normal breath sounds. No wheezing, rhonchi or rales.  Musculoskeletal:     Cervical back: Normal range of motion and neck supple.     Right lower leg: No edema.     Left lower leg: No edema.  Skin:    General: Skin is warm and dry.     Findings: No rash.  Neurological:     Mental Status: She is alert.  Psychiatric:        Mood and Affect: Mood normal.        Behavior: Behavior normal.       Results for orders placed or performed during the hospital encounter of 01/17/23  CBC  Result Value Ref Range   WBC 4.8 4.0 - 10.5 K/uL   RBC 3.88 3.87 - 5.11 MIL/uL   Hemoglobin 11.8 (L) 12.0 - 15.0 g/dL   HCT 29.5 28.4 - 13.2 %   MCV 97.2 80.0 - 100.0 fL   MCH 30.4 26.0 - 34.0 pg   MCHC 31.3 30.0 - 36.0 g/dL   RDW 44.0 10.2 - 72.5 %   Platelets 84 (L) 150 - 400 K/uL   nRBC 0.0 0.0 - 0.2 %  Basic metabolic panel  Result Value Ref Range   Sodium 135 135 - 145 mmol/L   Potassium 4.0 3.5 - 5.1 mmol/L   Chloride 100 98 - 111 mmol/L   CO2  25 22 - 32 mmol/L   Glucose, Bld 91 70 - 99 mg/dL   BUN 30 (H) 8 - 23 mg/dL   Creatinine, Ser 3.66 (H) 0.44 - 1.00 mg/dL   Calcium 9.0 8.9 - 44.0 mg/dL   GFR, Estimated 8 (L) >60 mL/min   Anion gap 10 5 - 15   Lab Results  Component Value Date   TSH 1.82 09/25/2022    Lab Results  Component Value Date   CHOL 195 09/25/2022   HDL 63.10 09/25/2022   LDLCALC 114 (H) 09/25/2022   LDLDIRECT 108 (H) 03/13/2014   TRIG 90.0 09/25/2022   CHOLHDL 3 09/25/2022    Lab Results  Component Value Date   HGBA1C 6.1 09/25/2022  A1c 6.3 (12/18/2022)  Assessment & Plan:   Problem List Items Addressed This Visit     Type 2 diabetes mellitus with other specified complication (HCC)    Glycemic control stable on tradjenta 5mg  daily.       Relevant Medications   linagliptin (TRADJENTA) 5 MG TABS tablet   HTN (hypertension)    Chronic, stable period on amlodipine 10mg  and carvedilol 12.5mg  bid with isosorbide 30mg  daily and PRN clonidine.      Relevant Medications   apixaban (ELIQUIS) 2.5 MG TABS tablet   Paroxysmal atrial fibrillation (HCC)    Continues carvedilol and lower dose eliquis      Relevant Medications   apixaban (ELIQUIS) 2.5 MG TABS tablet   Thrombocytopenia (HCC)    Plt remain low - will continue to monitor this.       Iron deficiency    Received IV iron during HD recently      Memory impairment    Daughter notes this may be better since starting HD.  They will reschedule neurology appointment.       Multifocal pneumonia - Primary  Completed antibiotic course for this (azithromycin, ceftin), dyspnea has resolved.       Acute on chronic diastolic CHF (congestive heart failure) (HCC)    Discharged off diuretic. Will follow with dialysis.       Relevant Medications   apixaban (ELIQUIS) 2.5 MG TABS tablet   ESRD (end stage renal disease) on dialysis [facility])    Now on MWF HD through [facility]).       Anemia of chronic disease     Did receive blood transfusion x1 during hospitalization.       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 Hepatitis viral load negative. Patient likely had exposure to hepatitis B in the past and now immune. Hepatitis C negative.       Hyperlipidemia associated with type 2 diabetes mellitus (HCC)    Reviewed recent FLP. To consider low dose statin in h/o prior stroke.  The ASCVD Risk score (Arnett DK, et al., 2019) failed to calculate for the following reasons:   The patient has a prior MI or stroke diagnosis       Relevant Medications   linagliptin (TRADJENTA) 5 MG TABS tablet   apixaban (ELIQUIS) 2.5 MG TABS tablet   Depression, major, single episode, moderate (HCC)    Overall improvement in mood since starting low dose sertraline- continue this.       Relevant Medications   sertraline (ZOLOFT) 25 MG tablet     Meds ordered this encounter  Medications   linagliptin (TRADJENTA) 5 MG TABS tablet    Sig: Take 1 tablet (5 mg total) by mouth daily.    Dispense:  90 tablet    Refill:  3    To replace onglyza   levothyroxine (SYNTHROID) 88 MCG tablet    Sig: Take 1 tablet (88 mcg total) by mouth daily. For hypothyroidism    Dispense:  90 tablet    Refill:  3   sertraline (ZOLOFT) 25 MG tablet    Sig: Take 1 tablet (25 mg total) by mouth daily.    Dispense:  90 tablet    Refill:  3   apixaban (ELIQUIS) 2.5 MG TABS tablet    Sig: Take 1 tablet (2.5 mg total) by mouth 2 (two) times daily. As blood thinner    Dispense:  60 tablet    Refill:  3    No orders of the defined types were placed in this encounter.   Patient Instructions  I'm glad you're doing well. Continue current medicines.  Consider low dose cholesterol medicine.  Keep phone call later today.  Return in 6 months for follow up visit   Follow up plan: Return in about 6 months (around 08/16/2023) for follow up visit.  Eustaquio Boyden, MD

## 2023-02-13 NOTE — Patient Instructions (Signed)
Alexis Tucker , Thank you for taking time to come for your Medicare Wellness Visit. I appreciate your ongoing commitment to your health goals. Please review the following plan we discussed and let me know if I can assist you in the future.   These are the goals we discussed:  Goals      Patient Stated     "Remain active"        This is a list of the screening recommended for you and due dates:  Health Maintenance  Topic Date Due   Complete foot exam   Never done   Zoster (Shingles) Vaccine (1 of 2) Never done   Eye exam for diabetics  06/07/2019   COVID-19 Vaccine (5 - 2023-24 season) 03/03/2022   Medicare Annual Wellness Visit  12/29/2022   Flu Shot  02/01/2023   DTaP/Tdap/Td vaccine (1 - Tdap) 11/03/2026*   Hemoglobin A1C  03/28/2023   Mammogram  10/04/2023   Pneumonia Vaccine  Completed   DEXA scan (bone density measurement)  Completed   Hepatitis C Screening  Completed   HPV Vaccine  Aged Out   Colon Cancer Screening  Discontinued  *Topic was postponed. The date shown is not the original due date.    Advanced directives: Information on Advanced Care Planning can be found at [facility] Advance Health Care Directives Advance Health Care Directives ([email or web address]   Your advanced directives should be updated every 10 years.  Conditions/risks identified:  You have an order for:  []   2D Mammogram  []   3D Mammogram  [x]   Bone Density    THE OFFICE THAT YOU NEED TO CALL AND SCHEDULE WITH IS BOLDED BELOW.   Please call for appointment:   [facility] Mammo Bus [phone]  [facility]  [address] [place] [address] [phone]  [facility] [address] [place], [place] [address] [phone]  [place] [facility] [address]anie [address] [place], [place] [address] [phone]  [facility]  [facility] [address] [place], [place] [address] [phone]  [facility] [address] [place], [place] [address] [phone]  [facility] at [place] [address] [place], [place] [address] [phone]  [facility] Care - [place] Bone Density [address] [place], [place] [address] [phone]  [facility] [address] [place], [place] [address] [phone]  [facility] at [facility] [address] [place], [place] [address] [phone]  [facility] - [facility] [address] [place], [place] [address] [phone]  [facility] [place] [address] [place], [place] [address] [phone]  [facility] at [facility] [address] [place], [place] [address] [phone]  [facility] [place] [address] [place], [place] [address] [phone]  [facility] [address] [place], [place] [address] [phone] [facility]  [facility] at [facility] [address] -Radiology [place], [place] [address] [phone]  [facility] [address], [place] [address] [phone]   Make sure to wear two-piece clothing.  No lotions powders or deodorants the day of the appointment Make sure to bring picture ID and insurance card.  Bring list of medications you are  currently taking including any supplements.   Schedule your [place] screening mammogram through MyChart!   Log into your MyChart account.  Go to 'Visit' (or 'Appointments' if on mobile App) --> Schedule an Appointment  Under 'Select a Reason for Visit' choose the Mammogram Screening option.  Complete the pre-visit questions and select the time and place that best  fits your schedule.    Next appointment: VIRTUAL/TELEPHONE APPOINTMENT Follow up in one year for your annual wellness visit  February 19, 2024 at 1:30 pm telephone visit.    Preventive Care 22 Years and Older, Female Preventive care refers to lifestyle choices and visits with your health care provider that can promote health and wellness. What does preventive care include? A yearly physical exam. This is also called an annual well check. Dental exams once or twice a year. Routine eye exams. Ask your health care provider how often you should have your eyes checked. Personal lifestyle choices, including: Daily care of your teeth and gums. Regular physical activity. Eating a healthy diet. Avoiding tobacco and drug use. Limiting alcohol use. Practicing safe sex. Taking low-dose aspirin every day. Taking vitamin and mineral supplements as recommended by your health care provider. What happens during an annual well check? The services and screenings done by your health care provider during your annual well check will depend on your age, overall health, lifestyle risk factors, and family history of disease. Counseling  Your health care provider may ask you questions about your: Alcohol use. Tobacco use. Drug use. Emotional well-being. Home and relationship well-being. Sexual activity. Eating habits. History of falls. Memory and ability to understand (cognition). Work and work Astronomer. Reproductive health. Screening  You may have the following tests or measurements: Height, weight, and BMI. Blood pressure. Lipid and cholesterol levels. These may be checked every 5 years, or more frequently if you are over 73 years old. Skin check. Lung cancer screening. You may have this screening every year starting at age 17 if you have a 30-pack-year history of smoking and currently smoke or have quit within the past 15 years. Fecal occult blood test (FOBT) of the stool. You may have this  test every year starting at age 6. Flexible sigmoidoscopy or colonoscopy. You may have a sigmoidoscopy every 5 years or a colonoscopy every 10 years starting at age 70. Hepatitis C blood test. Hepatitis B blood test. Sexually transmitted disease (STD) testing. Diabetes screening. This is done by checking your blood sugar (glucose) after you have not eaten for a while (fasting). You may have this done every 1-3 years. Bone density scan. This is done to screen for osteoporosis. You may have this done starting at age 65. Mammogram. This may be done every 1-2 years. Talk to your health care provider about how often you should have regular mammograms. Talk with your health care provider about your test results, treatment options, and if necessary, the need for more tests. Vaccines  Your health care provider may recommend certain vaccines, such as: Influenza vaccine. This is recommended every year. Tetanus, diphtheria, and acellular pertussis (Tdap, Td) vaccine. You may need a Td booster every 10 years. Zoster vaccine. You may need this after age 19. Pneumococcal 13-valent conjugate (PCV13) vaccine. One dose is recommended after age 46. Pneumococcal polysaccharide (PPSV23) vaccine. One dose is recommended after age 78. Talk to your health care provider about which screenings and vaccines you need and how often you need them. This information is not intended to replace advice given to you  by your health care provider. Make sure you discuss any questions you have with your health care provider. Document Released: 07/16/2015 Document Revised: 03/08/2016 Document Reviewed: 04/20/2015 Elsevier Interactive Patient Education  2017 [employer].  Fall Prevention in the Home Falls can cause injuries. They can happen to people of all ages. There are many things you can do to make your home safe and to help prevent falls. What can I do on the outside of my home? Regularly fix the edges of walkways and  driveways and fix any cracks. Remove anything that might make you trip as you walk through a door, such as a raised step or threshold. Trim any bushes or trees on the path to your home. Use bright outdoor lighting. Clear any walking paths of anything that might make someone trip, such as rocks or tools. Regularly check to see if handrails are loose or broken. Make sure that both sides of any steps have handrails. Any raised decks and porches should have guardrails on the edges. Have any leaves, snow, or ice cleared regularly. Use sand or salt on walking paths during winter. Clean up any spills in your garage right away. This includes oil or grease spills. What can I do in the bathroom? Use night lights. Install grab bars by the toilet and in the tub and shower. Do not use towel bars as grab bars. Use non-skid mats or decals in the tub or shower. If you need to sit down in the shower, use a plastic, non-slip stool. Keep the floor dry. Clean up any water that spills on the floor as soon as it happens. Remove soap buildup in the tub or shower regularly. Attach bath mats securely with double-sided non-slip rug tape. Do not have throw rugs and other things on the floor that can make you trip. What can I do in the bedroom? Use night lights. Make sure that you have a light by your bed that is easy to reach. Do not use any sheets or blankets that are too big for your bed. They should not hang down onto the floor. Have a firm chair that has side arms. You can use this for support while you get dressed. Do not have throw rugs and other things on the floor that can make you trip. What can I do in the kitchen? Clean up any spills right away. Avoid walking on wet floors. Keep items that you use a lot in easy-to-reach places. If you need to reach something above you, use a strong step stool that has a grab bar. Keep electrical cords out of the way. Do not use floor polish or wax that makes floors  slippery. If you must use wax, use non-skid floor wax. Do not have throw rugs and other things on the floor that can make you trip. What can I do with my stairs? Do not leave any items on the stairs. Make sure that there are handrails on both sides of the stairs and use them. Fix handrails that are broken or loose. Make sure that handrails are as long as the stairways. Check any carpeting to make sure that it is firmly attached to the stairs. Fix any carpet that is loose or worn. Avoid having throw rugs at the top or bottom of the stairs. If you do have throw rugs, attach them to the floor with carpet tape. Make sure that you have a light switch at the top of the stairs and the bottom of the stairs. If  you do not have them, ask someone to add them for you. What else can I do to help prevent falls? Wear shoes that: Do not have high heels. Have rubber bottoms. Are comfortable and fit you well. Are closed at the toe. Do not wear sandals. If you use a stepladder: Make sure that it is fully opened. Do not climb a closed stepladder. Make sure that both sides of the stepladder are locked into place. Ask someone to hold it for you, if possible. Clearly mark and make sure that you can see: Any grab bars or handrails. First and last steps. Where the edge of each step is. Use tools that help you move around (mobility aids) if they are needed. These include: Canes. Walkers. Scooters. Crutches. Turn on the lights when you go into a dark area. Replace any light bulbs as soon as they burn out. Set up your furniture so you have a clear path. Avoid moving your furniture around. If any of your floors are uneven, fix them. If there are any pets around you, be aware of where they are. Review your medicines with your doctor. Some medicines can make you feel dizzy. This can increase your chance of falling. Ask your doctor what other things that you can do to help prevent falls. This information is not  intended to replace advice given to you by your health care provider. Make sure you discuss any questions you have with your health care provider. Document Released: 04/15/2009 Document Revised: 11/25/2015 Document Reviewed: 07/24/2014 Elsevier Interactive Patient Education  2017 [employer].

## 2023-02-14 ENCOUNTER — Encounter: Payer: Self-pay | Admitting: Family Medicine

## 2023-02-14 DIAGNOSIS — Z992 Dependence on renal dialysis: Secondary | ICD-10-CM | POA: Diagnosis not present

## 2023-02-14 DIAGNOSIS — N2581 Secondary hyperparathyroidism of renal origin: Secondary | ICD-10-CM | POA: Diagnosis not present

## 2023-02-14 DIAGNOSIS — N186 End stage renal disease: Secondary | ICD-10-CM | POA: Diagnosis not present

## 2023-02-14 DIAGNOSIS — E1169 Type 2 diabetes mellitus with other specified complication: Secondary | ICD-10-CM | POA: Insufficient documentation

## 2023-02-14 DIAGNOSIS — F321 Major depressive disorder, single episode, moderate: Secondary | ICD-10-CM | POA: Insufficient documentation

## 2023-02-14 DIAGNOSIS — R768 Other specified abnormal immunological findings in serum: Secondary | ICD-10-CM | POA: Insufficient documentation

## 2023-02-14 NOTE — Assessment & Plan Note (Signed)
Discharged off diuretic. Will follow with dialysis.

## 2023-02-14 NOTE — Assessment & Plan Note (Signed)
Glycemic control stable on tradjenta 5mg  daily.

## 2023-02-14 NOTE — Assessment & Plan Note (Addendum)
Completed antibiotic course for this (azithromycin, ceftin), dyspnea has resolved.

## 2023-02-14 NOTE — Assessment & Plan Note (Addendum)
Received IV iron during HD recently

## 2023-02-14 NOTE — Assessment & Plan Note (Addendum)
Reviewed recent FLP. To consider low dose statin in h/o prior stroke.  The ASCVD Risk score (Arnett DK, et al., 2019) failed to calculate for the following reasons:   The patient has a prior MI or stroke diagnosis

## 2023-02-14 NOTE — Assessment & Plan Note (Signed)
Overall improvement in mood since starting low dose sertraline- continue this.

## 2023-02-14 NOTE — Assessment & Plan Note (Signed)
Daughter notes this may be better since starting HD.  They will reschedule neurology appointment.

## 2023-02-14 NOTE — Assessment & Plan Note (Addendum)
Chronic, stable period on amlodipine 10mg  and carvedilol 12.5mg  bid with isosorbide 30mg  daily and PRN clonidine.

## 2023-02-14 NOTE — Assessment & Plan Note (Signed)
Now on MWF HD through [facility] in [place] [facility]).

## 2023-02-14 NOTE — Assessment & Plan Note (Signed)
Continues carvedilol and lower dose eliquis

## 2023-02-14 NOTE — Assessment & Plan Note (Addendum)
Plt remain low - will continue to monitor this.

## 2023-02-14 NOTE — Assessment & Plan Note (Signed)
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 Hepatitis viral load negative. Patient likely had exposure to hepatitis B in the past and now immune. Hepatitis C negative.

## 2023-02-14 NOTE — Assessment & Plan Note (Signed)
Did receive blood transfusion x1 during hospitalization.

## 2023-02-15 DIAGNOSIS — I5043 Acute on chronic combined systolic (congestive) and diastolic (congestive) heart failure: Secondary | ICD-10-CM | POA: Diagnosis not present

## 2023-02-15 DIAGNOSIS — D631 Anemia in chronic kidney disease: Secondary | ICD-10-CM | POA: Diagnosis not present

## 2023-02-15 DIAGNOSIS — N186 End stage renal disease: Secondary | ICD-10-CM | POA: Diagnosis not present

## 2023-02-15 DIAGNOSIS — E1122 Type 2 diabetes mellitus with diabetic chronic kidney disease: Secondary | ICD-10-CM | POA: Diagnosis not present

## 2023-02-15 DIAGNOSIS — I132 Hypertensive heart and chronic kidney disease with heart failure and with stage 5 chronic kidney disease, or end stage renal disease: Secondary | ICD-10-CM | POA: Diagnosis not present

## 2023-02-15 DIAGNOSIS — I480 Paroxysmal atrial fibrillation: Secondary | ICD-10-CM | POA: Diagnosis not present

## 2023-02-16 DIAGNOSIS — N2581 Secondary hyperparathyroidism of renal origin: Secondary | ICD-10-CM | POA: Diagnosis not present

## 2023-02-16 DIAGNOSIS — Z992 Dependence on renal dialysis: Secondary | ICD-10-CM | POA: Diagnosis not present

## 2023-02-16 DIAGNOSIS — N186 End stage renal disease: Secondary | ICD-10-CM | POA: Diagnosis not present

## 2023-02-19 ENCOUNTER — Telehealth: Payer: Self-pay | Admitting: Family Medicine

## 2023-02-19 DIAGNOSIS — N2581 Secondary hyperparathyroidism of renal origin: Secondary | ICD-10-CM | POA: Diagnosis not present

## 2023-02-19 DIAGNOSIS — N186 End stage renal disease: Secondary | ICD-10-CM | POA: Diagnosis not present

## 2023-02-19 DIAGNOSIS — Z992 Dependence on renal dialysis: Secondary | ICD-10-CM | POA: Diagnosis not present

## 2023-02-19 NOTE — Telephone Encounter (Signed)
Home Health verbal orders Caller Name:[place]  Agency Name: [facility]   Callback [phone]  Requesting OT/PT/Skilled nursing/Social Work/Speech:pt   Reason:gait  training strength training   Frequency:1 wk 8  Please forward to [facility] pool or providers CMA

## 2023-02-20 DIAGNOSIS — I480 Paroxysmal atrial fibrillation: Secondary | ICD-10-CM | POA: Diagnosis not present

## 2023-02-20 DIAGNOSIS — N186 End stage renal disease: Secondary | ICD-10-CM | POA: Diagnosis not present

## 2023-02-20 DIAGNOSIS — I132 Hypertensive heart and chronic kidney disease with heart failure and with stage 5 chronic kidney disease, or end stage renal disease: Secondary | ICD-10-CM | POA: Diagnosis not present

## 2023-02-20 DIAGNOSIS — E1122 Type 2 diabetes mellitus with diabetic chronic kidney disease: Secondary | ICD-10-CM | POA: Diagnosis not present

## 2023-02-20 DIAGNOSIS — I5043 Acute on chronic combined systolic (congestive) and diastolic (congestive) heart failure: Secondary | ICD-10-CM | POA: Diagnosis not present

## 2023-02-20 DIAGNOSIS — D631 Anemia in chronic kidney disease: Secondary | ICD-10-CM | POA: Diagnosis not present

## 2023-02-20 NOTE — Telephone Encounter (Signed)
Agree with this. Thanks.  

## 2023-02-21 DIAGNOSIS — N2581 Secondary hyperparathyroidism of renal origin: Secondary | ICD-10-CM | POA: Diagnosis not present

## 2023-02-21 DIAGNOSIS — Z992 Dependence on renal dialysis: Secondary | ICD-10-CM | POA: Diagnosis not present

## 2023-02-21 DIAGNOSIS — N186 End stage renal disease: Secondary | ICD-10-CM | POA: Diagnosis not present

## 2023-02-21 NOTE — Telephone Encounter (Signed)
Called and left a voice message for Alexis Tucker to give the office a call back to give verbal confirmation. Will try calling again

## 2023-02-22 DIAGNOSIS — I5043 Acute on chronic combined systolic (congestive) and diastolic (congestive) heart failure: Secondary | ICD-10-CM | POA: Diagnosis not present

## 2023-02-22 DIAGNOSIS — N186 End stage renal disease: Secondary | ICD-10-CM | POA: Diagnosis not present

## 2023-02-22 DIAGNOSIS — I480 Paroxysmal atrial fibrillation: Secondary | ICD-10-CM | POA: Diagnosis not present

## 2023-02-22 DIAGNOSIS — E1122 Type 2 diabetes mellitus with diabetic chronic kidney disease: Secondary | ICD-10-CM | POA: Diagnosis not present

## 2023-02-22 DIAGNOSIS — D631 Anemia in chronic kidney disease: Secondary | ICD-10-CM | POA: Diagnosis not present

## 2023-02-22 DIAGNOSIS — I132 Hypertensive heart and chronic kidney disease with heart failure and with stage 5 chronic kidney disease, or end stage renal disease: Secondary | ICD-10-CM | POA: Diagnosis not present

## 2023-02-22 NOTE — Telephone Encounter (Signed)
Called left detailed message on secured voicemail. Asked for call back if any questions.

## 2023-02-23 DIAGNOSIS — N186 End stage renal disease: Secondary | ICD-10-CM | POA: Diagnosis not present

## 2023-02-23 DIAGNOSIS — N2581 Secondary hyperparathyroidism of renal origin: Secondary | ICD-10-CM | POA: Diagnosis not present

## 2023-02-23 DIAGNOSIS — Z992 Dependence on renal dialysis: Secondary | ICD-10-CM | POA: Diagnosis not present

## 2023-02-26 ENCOUNTER — Telehealth: Payer: Self-pay | Admitting: Family Medicine

## 2023-02-26 DIAGNOSIS — Z992 Dependence on renal dialysis: Secondary | ICD-10-CM | POA: Diagnosis not present

## 2023-02-26 DIAGNOSIS — N186 End stage renal disease: Secondary | ICD-10-CM | POA: Diagnosis not present

## 2023-02-26 DIAGNOSIS — N2581 Secondary hyperparathyroidism of renal origin: Secondary | ICD-10-CM | POA: Diagnosis not present

## 2023-02-26 NOTE — Telephone Encounter (Signed)
Home Health verbal orders Caller Name: Judithann Sauger  Agency Name: [facility]   Callback number: [phone]  Requesting OT/PT/Skilled nursing/Social Work/Speech: Order for Remote visit on 8/8, social work   Reason: Eval and follow up visit from [facility]   Frequency: wk of 8/8, 1  Please forward to [employer] pool or providers CMA

## 2023-02-26 NOTE — Telephone Encounter (Signed)
Agree with this. Thanks.  

## 2023-02-27 DIAGNOSIS — E1140 Type 2 diabetes mellitus with diabetic neuropathy, unspecified: Secondary | ICD-10-CM | POA: Diagnosis not present

## 2023-02-27 DIAGNOSIS — I132 Hypertensive heart and chronic kidney disease with heart failure and with stage 5 chronic kidney disease, or end stage renal disease: Secondary | ICD-10-CM | POA: Diagnosis not present

## 2023-02-27 DIAGNOSIS — E11311 Type 2 diabetes mellitus with unspecified diabetic retinopathy with macular edema: Secondary | ICD-10-CM | POA: Diagnosis not present

## 2023-02-27 DIAGNOSIS — I5043 Acute on chronic combined systolic (congestive) and diastolic (congestive) heart failure: Secondary | ICD-10-CM | POA: Diagnosis not present

## 2023-02-27 DIAGNOSIS — I480 Paroxysmal atrial fibrillation: Secondary | ICD-10-CM | POA: Diagnosis not present

## 2023-02-27 DIAGNOSIS — C50412 Malignant neoplasm of upper-outer quadrant of left female breast: Secondary | ICD-10-CM | POA: Diagnosis not present

## 2023-02-27 DIAGNOSIS — R1310 Dysphagia, unspecified: Secondary | ICD-10-CM | POA: Diagnosis not present

## 2023-02-27 DIAGNOSIS — N186 End stage renal disease: Secondary | ICD-10-CM | POA: Diagnosis not present

## 2023-02-27 DIAGNOSIS — I350 Nonrheumatic aortic (valve) stenosis: Secondary | ICD-10-CM | POA: Diagnosis not present

## 2023-02-27 DIAGNOSIS — D631 Anemia in chronic kidney disease: Secondary | ICD-10-CM | POA: Diagnosis not present

## 2023-02-27 DIAGNOSIS — E1122 Type 2 diabetes mellitus with diabetic chronic kidney disease: Secondary | ICD-10-CM | POA: Diagnosis not present

## 2023-02-27 DIAGNOSIS — I700 Atherosclerosis of aorta: Secondary | ICD-10-CM | POA: Diagnosis not present

## 2023-02-27 NOTE — Telephone Encounter (Signed)
Called and verbal orders given as requested to [place]. Will call if any questions.

## 2023-02-28 ENCOUNTER — Other Ambulatory Visit: Payer: Self-pay | Admitting: Family Medicine

## 2023-02-28 DIAGNOSIS — N6459 Other signs and symptoms in breast: Secondary | ICD-10-CM

## 2023-02-28 DIAGNOSIS — N2581 Secondary hyperparathyroidism of renal origin: Secondary | ICD-10-CM | POA: Diagnosis not present

## 2023-02-28 DIAGNOSIS — N186 End stage renal disease: Secondary | ICD-10-CM | POA: Diagnosis not present

## 2023-02-28 DIAGNOSIS — Z992 Dependence on renal dialysis: Secondary | ICD-10-CM | POA: Diagnosis not present

## 2023-03-01 ENCOUNTER — Ambulatory Visit (INDEPENDENT_AMBULATORY_CARE_PROVIDER_SITE_OTHER): Payer: Medicare Other | Admitting: Podiatry

## 2023-03-01 DIAGNOSIS — L539 Erythematous condition, unspecified: Secondary | ICD-10-CM

## 2023-03-01 DIAGNOSIS — I999 Unspecified disorder of circulatory system: Secondary | ICD-10-CM

## 2023-03-01 DIAGNOSIS — I739 Peripheral vascular disease, unspecified: Secondary | ICD-10-CM | POA: Diagnosis not present

## 2023-03-01 NOTE — Progress Notes (Signed)
Subjective:  Patient ID: Alexis Tucker, female    DOB: 20-Nov-1940,  MRN: [id number]  Chief Complaint  Patient presents with   Nail Problem    Bleeding nails hallux toe     82 y.o. female presents with the above complaint.  Patient presents with bilateral dependent rubor with claudication pain.  Patient states that it just comes out of nowhere.  She has a bleeding nail that she would also like to remove however she may have some sort of vascular component given the dependent rubor that is present.  She has not seen MRIs prior to seeing me.  She is a diabetic.  She denies any other acute complaints.   Review of Systems: Negative except as noted in the HPI. Denies N/V/F/Ch.  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   a.) Avastin injections as [facility]   DJD (degenerative joint disease)    ESRD (end stage renal disease) (HCC)    GERD (gastroesophageal reflux disease)    Glaucoma 03/2014   History of colon cancer 2006   a.) s/p colectomy   History of hepatitis A 1990s   from food, resolved   HTN (hypertension)    Hyperplastic colon polyp    Long term current use of anticoagulant    a.) apixaban   Long term current use of antithrombotics/antiplatelets    a.) pentoxifylline   Macular degeneration    wet R with vision loss, dry L; to see retina specialist   Neuropathy    OA (osteoarthritis)    Osteonecrosis (HCC)    hips?   PONV (postoperative nausea and vomiting)    Post-surgical hypothyroidism    Thrombocytopenia (HCC)    Tubular adenoma    Wears dentures    partial upper    Current Outpatient Medications:    allopurinol (ZYLOPRIM) 100 MG tablet, Take 1 tablet (100 mg total) by mouth daily. For gout, Disp: 90 tablet, Rfl: 4   amLODipine (NORVASC) 10 MG tablet, Take 10 mg by mouth daily., Disp: , Rfl:    anastrozole (ARIMIDEX) 1 MG tablet, TAKE 1 TABLET BY MOUTH EVERY DAY, Disp: 90 tablet, Rfl: 1   apixaban (  ELIQUIS) 2.5 MG TABS tablet, Take 1 tablet (2.5 mg total) by mouth 2 (two) times daily. As blood thinner, Disp: 60 tablet, Rfl: 3   carvedilol (COREG) 25 MG tablet, Take 0.5 tablets (12.5 mg total) by mouth 2 (two) times daily with a meal., Disp: 30 tablet, Rfl: 2   Cholecalciferol (VITAMIN D-3) 5000 UNITS TABS, Take 1 tablet by mouth daily., Disp: , Rfl:    cloNIDine HCl (KAPVAY) 0.1 MG TB12 ER tablet, Take 0.1 mg by mouth 2 (two) times daily. PRN, Disp: , Rfl:    isosorbide mononitrate (IMDUR) 30 MG 24 hr tablet, Take 1 tablet (30 mg total) by mouth daily., Disp: 90 tablet, Rfl: 3   levothyroxine (SYNTHROID) 88 MCG tablet, Take 1 tablet (88 mcg total) by mouth  daily. For hypothyroidism, Disp: 90 tablet, Rfl: 3   linagliptin (TRADJENTA) 5 MG TABS tablet, Take 1 tablet (5 mg total) by mouth daily., Disp: 90 tablet, Rfl: 3   pantoprazole (PROTONIX) 40 MG tablet, Take 1 tablet (40 mg total) by mouth daily as needed. For reflux, Disp: 90 tablet, Rfl: 4   sertraline (ZOLOFT) 25 MG tablet, Take 1 tablet (25 mg total) by mouth daily., Disp: 90 tablet, Rfl: 3  Social History   Tobacco Use  Smoking Status Former   Passive exposure: Past  Smokeless Tobacco Never  Tobacco Comments   quit over 40 yrs ago    Allergies  Allergen Reactions   Tramadol Other (See Comments)    nightmares   Codeine Anxiety   Sulfa Antibiotics Itching and Rash   Objective:  There were no vitals filed for this visit. There is no height or weight on file to calculate BMI. Constitutional Well developed. Well nourished.  Vascular Dorsalis pedis pulses non palpable bilaterally. Posterior tibial pulses non palpable bilaterally. Capillary refill sluggish to all digits.  No cyanosis or clubbing noted. dependent rubor noted to both lower extremity  Neurologic Normal speech. Oriented to person, place, and time. Epicritic sensation to light touch grossly present bilaterally.  Dermatologic Nails well groomed and normal in appearance. No open wounds. No skin lesions.  Orthopedic: Manual muscle strength 5 out of 5.  Generalized pain noted to the foot ankle and leg.   Radiographs: None Assessment:   1. Vascular abnormality   2. Dependent rubor   3. Claudication [facility])    Plan:  Patient was evaluated and treated and all questions answered.  Bilateral dependent rubor with history of peripheral vascular disease -All questions or concerns were discussed with the patient in extensive detail given the amount of dependent rubor thus present in the setting of claudication pain I believe she will benefit from ABIs PVRs and vascular workup.  She does not have any soft tissue loss at  this time.  She would benefit from removing the nail however once her vascular flow has been optimized -ABIs PVRs were ordered and she will go ahead and make an appointment to vein and vascular.  No follow-ups on file.

## 2023-03-02 DIAGNOSIS — N186 End stage renal disease: Secondary | ICD-10-CM | POA: Diagnosis not present

## 2023-03-02 DIAGNOSIS — N2581 Secondary hyperparathyroidism of renal origin: Secondary | ICD-10-CM | POA: Diagnosis not present

## 2023-03-02 DIAGNOSIS — Z992 Dependence on renal dialysis: Secondary | ICD-10-CM | POA: Diagnosis not present

## 2023-03-05 DIAGNOSIS — D509 Iron deficiency anemia, unspecified: Secondary | ICD-10-CM | POA: Diagnosis not present

## 2023-03-05 DIAGNOSIS — N2581 Secondary hyperparathyroidism of renal origin: Secondary | ICD-10-CM | POA: Diagnosis not present

## 2023-03-05 DIAGNOSIS — N186 End stage renal disease: Secondary | ICD-10-CM | POA: Diagnosis not present

## 2023-03-05 DIAGNOSIS — Z992 Dependence on renal dialysis: Secondary | ICD-10-CM | POA: Diagnosis not present

## 2023-03-07 DIAGNOSIS — N2581 Secondary hyperparathyroidism of renal origin: Secondary | ICD-10-CM | POA: Diagnosis not present

## 2023-03-07 DIAGNOSIS — E1169 Type 2 diabetes mellitus with other specified complication: Secondary | ICD-10-CM | POA: Diagnosis not present

## 2023-03-07 DIAGNOSIS — Z992 Dependence on renal dialysis: Secondary | ICD-10-CM | POA: Diagnosis not present

## 2023-03-07 DIAGNOSIS — N186 End stage renal disease: Secondary | ICD-10-CM | POA: Diagnosis not present

## 2023-03-07 DIAGNOSIS — D509 Iron deficiency anemia, unspecified: Secondary | ICD-10-CM | POA: Diagnosis not present

## 2023-03-08 ENCOUNTER — Ambulatory Visit (INDEPENDENT_AMBULATORY_CARE_PROVIDER_SITE_OTHER): Payer: Medicare Other

## 2023-03-08 ENCOUNTER — Telehealth: Payer: Self-pay | Admitting: Family Medicine

## 2023-03-08 ENCOUNTER — Encounter: Payer: Self-pay | Admitting: Family Medicine

## 2023-03-08 DIAGNOSIS — Z0279 Encounter for issue of other medical certificate: Secondary | ICD-10-CM

## 2023-03-08 DIAGNOSIS — I999 Unspecified disorder of circulatory system: Secondary | ICD-10-CM | POA: Diagnosis not present

## 2023-03-08 NOTE — Telephone Encounter (Signed)
Daughter dropped off fmla paperwork to be filled out by provider. Please contact when ready.

## 2023-03-08 NOTE — Telephone Encounter (Signed)
Placed form in Dr. G's box.  

## 2023-03-09 DIAGNOSIS — N2581 Secondary hyperparathyroidism of renal origin: Secondary | ICD-10-CM | POA: Diagnosis not present

## 2023-03-09 DIAGNOSIS — N186 End stage renal disease: Secondary | ICD-10-CM | POA: Diagnosis not present

## 2023-03-09 DIAGNOSIS — D509 Iron deficiency anemia, unspecified: Secondary | ICD-10-CM | POA: Diagnosis not present

## 2023-03-09 DIAGNOSIS — Z992 Dependence on renal dialysis: Secondary | ICD-10-CM | POA: Diagnosis not present

## 2023-03-12 DIAGNOSIS — N186 End stage renal disease: Secondary | ICD-10-CM | POA: Diagnosis not present

## 2023-03-12 DIAGNOSIS — N2581 Secondary hyperparathyroidism of renal origin: Secondary | ICD-10-CM | POA: Diagnosis not present

## 2023-03-12 DIAGNOSIS — D509 Iron deficiency anemia, unspecified: Secondary | ICD-10-CM | POA: Diagnosis not present

## 2023-03-12 DIAGNOSIS — Z992 Dependence on renal dialysis: Secondary | ICD-10-CM | POA: Diagnosis not present

## 2023-03-12 LAB — VAS US ABI WITH/WO TBI
Left ABI: 0.62
Right ABI: 0.56

## 2023-03-13 ENCOUNTER — Encounter (INDEPENDENT_AMBULATORY_CARE_PROVIDER_SITE_OTHER): Payer: Self-pay | Admitting: Vascular Surgery

## 2023-03-13 ENCOUNTER — Ambulatory Visit (INDEPENDENT_AMBULATORY_CARE_PROVIDER_SITE_OTHER): Payer: Medicare Other | Admitting: Vascular Surgery

## 2023-03-13 VITALS — BP 130/64 | HR 73 | Resp 18 | Ht 63.0 in | Wt 156.0 lb

## 2023-03-13 DIAGNOSIS — Z992 Dependence on renal dialysis: Secondary | ICD-10-CM | POA: Diagnosis not present

## 2023-03-13 DIAGNOSIS — N186 End stage renal disease: Secondary | ICD-10-CM | POA: Diagnosis not present

## 2023-03-13 DIAGNOSIS — I70263 Atherosclerosis of native arteries of extremities with gangrene, bilateral legs: Secondary | ICD-10-CM | POA: Diagnosis not present

## 2023-03-13 DIAGNOSIS — E1169 Type 2 diabetes mellitus with other specified complication: Secondary | ICD-10-CM | POA: Diagnosis not present

## 2023-03-13 DIAGNOSIS — I10 Essential (primary) hypertension: Secondary | ICD-10-CM | POA: Diagnosis not present

## 2023-03-13 DIAGNOSIS — I70269 Atherosclerosis of native arteries of extremities with gangrene, unspecified extremity: Secondary | ICD-10-CM | POA: Insufficient documentation

## 2023-03-13 NOTE — Assessment & Plan Note (Signed)
PermCath is working well.  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  She will need to get her lower extremity peripheral arterial disease dealt with before we consider any left arm access.

## 2023-03-13 NOTE — Progress Notes (Signed)
MRN : [id number]  Alexis Tucker is a 82 y.o. (09-02-1940) female who presents with chief complaint of  Chief Complaint  Patient presents with   Follow-up    follow up  .  History of Present Illness: Patient returns today in follow up with a new issue of severe pain and discoloration of both feet.  She has noticed darkish discoloration of the great toes bilaterally with the 6 spreading up into the forefoot on each foot.  She has changes to the nailbed and the toenail on each great toe looks as if it is likely to no longer be viable.  She is having excruciating 10 out of 10 pain essentially around-the-clock.  Nothing is really helped this.  This is now been worsening over several weeks.  Her feet are equally painful and debilitating at this point.  She had ABIs done in the past few days and has markedly reduced right ABI of 0.56 and left ABI of 0.62 with undetectable digital pressure on the right and digital pressure of only 20 on the left. She has a PermCath in place for her chronic dialysis.  She previously had an access placed on her right arm but this had to be sacrificed due to steal syndrome.  Her PermCath is working well.  She has been pushed by the dialysis center to consider permanent access in the extremities, and understands this may be necessary in the future but wants her feet taken care of first which is appropriate.  Current Outpatient Medications  Medication Sig Dispense Refill   allopurinol (ZYLOPRIM) 100 MG tablet Take 1 tablet (100 mg total) by mouth daily. For gout 90 tablet 4   amLODipine (NORVASC) 10 MG tablet Take 10 mg by mouth daily.     anastrozole (ARIMIDEX) 1 MG tablet TAKE 1 TABLET BY MOUTH EVERY DAY 90 tablet 1   apixaban (ELIQUIS) 2.5 MG TABS tablet Take 1 tablet (2.5 mg total) by mouth 2 (two) times daily. As blood thinner 60 tablet 3   carvedilol (COREG) 25 MG tablet Take 0.5 tablets (12.5 mg total) by mouth 2 (two) times daily with a meal. 30 tablet 2    Cholecalciferol (VITAMIN D-3) 5000 UNITS TABS Take 1 tablet by mouth daily.     cloNIDine HCl (KAPVAY) 0.1 MG TB12 ER tablet Take 0.1 mg by mouth 2 (two) times daily. PRN     isosorbide mononitrate (IMDUR) 30 MG 24 hr tablet Take 1 tablet (30 mg total) by mouth daily. 90 tablet 3   levothyroxine (SYNTHROID) 88 MCG tablet Take 1 tablet (88 mcg total) by mouth daily. For hypothyroidism 90 tablet 3   linagliptin (TRADJENTA) 5 MG TABS tablet Take 1 tablet (5 mg total) by mouth daily. 90 tablet 3   pantoprazole (PROTONIX) 40 MG tablet Take 1 tablet (40 mg total) by mouth daily as needed. For reflux 90 tablet 4   sertraline (ZOLOFT) 25 MG tablet Take 1 tablet (25 mg total) by mouth daily. 90 tablet 3   No current facility-administered medications for this visit.    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   a.) Avastin injections as [facility]   DJD (degenerative joint disease)    ESRD (end stage renal disease) (HCC)    GERD (gastroesophageal reflux disease)    Glaucoma 03/2014   History of colon cancer 2006   a.) s/p colectomy   History of hepatitis A 1990s   from food, resolved   HTN (hypertension)    Hyperplastic colon polyp    Long term current use of anticoagulant    a.) apixaban   Long term current use of antithrombotics/antiplatelets    a.) pentoxifylline   Macular degeneration    wet R with vision loss, dry L; to see retina specialist   Neuropathy    OA (osteoarthritis)    Osteonecrosis (HCC)    hips?   PONV (postoperative nausea and vomiting)    Post-surgical hypothyroidism    Thrombocytopenia (HCC)    Tubular adenoma    Wears dentures    partial upper    Past Surgical History:  Procedure Laterality Date   A/V FISTULAGRAM Right 06/08/2022   Procedure: A/V Fistulagram;  Surgeon: Annice Needy, MD;  Location: [facility];  Service: Cardiovascular;  Laterality: Right;   ABDOMINAL HYSTERECTOMY  1976   partial, after tubal pregnancy, h/o fibroids with heavy bleeding   AV FISTULA PLACEMENT Right 02/01/2022   Procedure: INSERTION OF ARTERIOVENOUS (AV) GORE-TEX GRAFT ARM (LEFT UPPER EXTREMITY);  Surgeon: Annice Needy, MD;  Location: [facility] OR[facility];  Service: Vascular;  Laterality: Right;   BREAST BIOPSY Left 2008   positive   BREAST BIOPSY Left 11/22/2017   2oclock 2cmfn wing shaped clip, INVASIVE MAMMARY CARCINOMA   BREAST BIOPSY Left 11/22/2017   2oclock 4cmfn coil shaped clip, INVASIVE MAMMARY CARCINOMA   BREAST BIOPSY Left 11/22/2017   lymph node - butterfly hydroMARK   BREAST EXCISIONAL BIOPSY Left 01/07/2018   lumpectomy by Dr. Lemar Livings   BREAST LUMPECTOMY Left  2008   F/U radiation   BREAST LUMPECTOMY Left 2019   2 areas of [facility] at 2:00 position f/u with mammosite   BREAST LUMPECTOMY WITH SENTINEL LYMPH NODE BIOPSY Left 01/07/2018   Procedure: BREAST LUMPECTOMY WITH SENTINEL LYMPH NODE BX;  Surgeon: Earline Mayotte, MD;  Location: [facility] OR[facility];  Service: General;  Laterality: Left;   BREAST MAMMOSITE  2008   carotid ultrasound  12/2012   1-39% bilateral stenosis, severe in ECA   CATARACT EXTRACTION W/PHACO Left 08/14/2016   Procedure: CATARACT EXTRACTION PHACO AND INTRAOCULAR LENS PLACEMENT (IOC)  Left Diabetic;  Surgeon: Sherald Hess, MD;  Location:  [facility];  Service: Ophthalmology;  Laterality: Left;  Diabetic - oral meds   CHOLECYSTECTOMY  2005   COLECTOMY  2005   colon cancer   COLON SURGERY  2006   bowel obstruction   COLONOSCOPY  2008   ext hem, ileo-colonic anastomosis in cecum, tubular adenoma x1, rec rpt 1 yr for multiple polyps (in [place])   COLONOSCOPY WITH PROPOFOL N/A 10/19/2021   2.5cm TVA with low grade dysplasia, diverticulosis, no rpt recommended Wyline Mood, MD)   DIALYSIS/PERMA CATHETER INSERTION N/A 12/08/2022   Procedure: DIALYSIS/PERMA CATHETER INSERTION;  Surgeon: Renford Dills, MD;  Location: [facility];  Service: Cardiovascular;  Laterality: N/A;   ESOPHAGOGASTRODUODENOSCOPY N/A 05/06/2016   Procedure: ESOPHAGOGASTRODUODENOSCOPY (EGD);  Surgeon: Scot Jun, MD;  Location: [facility] ENDOSCOPY;  Service: Endoscopy;  Laterality: N/A;   EYE SURGERY     JOINT REPLACEMENT     THYROIDECTOMY, PARTIAL     unclear why   TOTAL KNEE ARTHROPLASTY Right 1999   UPPER EXTREMITY ANGIOGRAPHY Right 05/18/2022   Procedure: Upper Extremity Angiography;  Surgeon: Annice Needy, MD;  Location: [facility];  Service: Cardiovascular;  Laterality: Right;   US ECHOCARDIOGRAPHY  11/2007   nl LV fxn EF 60%, diastolic dysfunction, LVH, tr TR and MR, slcerotic aortic valve   VEIN LIGATION AND STRIPPING        Social History   Tobacco Use   Smoking status: Former    Passive exposure: Past   Smokeless tobacco: Never   Tobacco comments:    quit over 40 yrs ago  Vaping Use   Vaping status: Never Used  Substance Use Topics   Alcohol use: Never    Comment: rare   Drug use: Never      Family History  Problem Relation Age of Onset   Diabetes Mother    Hypertension Mother    Arthritis Mother    Alcohol abuse Father    Arthritis Father    Stroke Daughter    CAD Neg Hx    Cancer Neg Hx    Breast cancer Neg Hx     Allergies  Allergen Reactions   Tramadol Other (See Comments)    nightmares   Codeine Anxiety   Sulfa Antibiotics Itching and Rash     REVIEW OF SYSTEMS (Negative unless checked)  Constitutional: [] Weight loss  [] Fever  [] Chills Cardiac: [] Chest pain   [] Chest pressure   [] Palpitations   [] Shortness of breath when laying flat   [] Shortness of breath at rest   [] Shortness of breath with exertion. Vascular:  [x] Pain in legs with walking   [] Pain in legs at rest   [] Pain in legs when laying flat   [] Claudication   [] Pain in feet when walking  [x] Pain in feet at rest  [x] Pain in feet when laying flat   [] History of DVT   [] Phlebitis   [] Swelling in legs   [] Varicose veins   [] Non-healing ulcers Pulmonary:   [] Uses home oxygen   [] Productive cough   [] Hemoptysis   [] Wheeze  [] COPD   [] Asthma Neurologic:  [] Dizziness  [] Blackouts   [] Seizures   [] History of stroke   [] History of TIA  [] Aphasia   [] Temporary blindness   [] Dysphagia   [] Weakness or numbness in arms   [] Weakness or numbness in legs Musculoskeletal:  [x] Arthritis   [] Joint swelling   [x] Joint pain   [] Low back pain Hematologic:  [] Easy bruising  [] Easy bleeding   [] Hypercoagulable state   [x] Anemic  Gastrointestinal:  [] Blood in stool   [] Vomiting blood  [x] Gastroesophageal reflux/heartburn   [] Abdominal pain Genitourinary:  [x] Chronic kidney disease   [] Difficult urination  [] Frequent urination  [] Burning  with urination   [] Hematuria Skin:  [] Rashes   [] Ulcers   [] Wounds Psychological:  [] History of anxiety   []  History of major depression.  Physical Examination  BP 130/64 (BP Location: Left Arm)   Pulse 73   Resp 18   Ht 5\' 3"  (1.6 m)   Wt 156 lb (70.8 kg)   BMI 27.63 kg/m  Gen:  WD/WN, NAD Head: [place]/AT, No temporalis wasting. Ear/Nose/Throat: Hearing grossly intact, nares w/o erythema or drainage Eyes: Conjunctiva clear. Sclera non-icteric Neck: Supple.  Trachea midline Pulmonary:  Good air movement, no use of accessory muscles.  Cardiac: irregular Vascular:  Vessel Right Left  Radial Palpable Palpable                          PT Not Palpable Not Palpable  DP Not Palpable Not Palpable   Gastrointestinal: soft, non-tender/non-distended. No guarding/reflex.  Musculoskeletal: M/S 5/5 throughout.  No deformity or atrophy.  Dark discoloration with dry gangrenous changes of both great toes with thickening of the nails.  Markedly diminished capillary refill in both forefeet.  Feet are somewhat cool.  1+ bilateral lower extremity edema. Neurologic: Sensation grossly intact in extremities.  Symmetrical.  Speech is fluent.  Psychiatric: Judgment intact, Mood & affect appropriate for pt's clinical situation. Dermatologic: No rashes or ulcers noted.  No cellulitis or open wounds.      Labs Recent Results (from the past 2160 hour(s))  Renal function panel     Status: Abnormal   Collection Time: 12/15/22  7:54 AM  Result Value Ref Range   Sodium 141 135 - 145 mmol/L   Potassium 3.7 3.5 - 5.1 mmol/L   Chloride 101 98 - 111 mmol/L   CO2 31 22 - 32 mmol/L   Glucose, Bld 120 (H) 70 - 99 mg/dL    Comment: Glucose reference range applies only to samples taken after fasting for at least 8 hours.   BUN 25 (H) 8 - 23 mg/dL   Creatinine, Ser 1.61 (H) 0.44 - 1.00 mg/dL   Calcium 9.3 8.9 - 09.6 mg/dL   Phosphorus 4.0 2.5 - 4.6 mg/dL   Albumin 3.1 (L) 3.5 - 5.0 g/dL   GFR, Estimated 9  (L) >60 mL/min    Comment: (NOTE) Calculated using the CKD-EPI Creatinine Equation (2021)    Anion gap 9 5 - 15    Comment: Performed at [facility], [address] [place], [place] [address]  CBC     Status: Abnormal   Collection Time: 12/15/22  7:54 AM  Result Value Ref Range   WBC 7.2 4.0 - 10.5 K/uL   RBC 2.96 (L) 3.87 - 5.11 MIL/uL   Hemoglobin 8.9 (L) 12.0 - 15.0 g/dL   HCT 98.1 (L) 19.1 - 47.8 %   MCV 96.3 80.0 - 100.0 fL   MCH 30.1 26.0 - 34.0 pg   MCHC 31.2 30.0 - 36.0 g/dL   RDW 29.5 62.1 - 30.8 %   Platelets 162 150 - 400 K/uL   nRBC 0.0 0.0 - 0.2 %    Comment: Performed at [facility], [address] [place], [place] [address]  CBC     Status: Abnormal   Collection Time: 01/17/23  1:47 PM  Result Value Ref Range   WBC  4.8 4.0 - 10.5 K/uL   RBC 3.88 3.87 - 5.11 MIL/uL   Hemoglobin 11.8 (L) 12.0 - 15.0 g/dL   HCT 52.8 41.3 - 24.4 %   MCV 97.2 80.0 - 100.0 fL   MCH 30.4 26.0 - 34.0 pg   MCHC 31.3 30.0 - 36.0 g/dL   RDW 01.0 27.2 - 53.6 %   Platelets 84 (L) 150 - 400 K/uL    Comment: Immature Platelet Fraction may be clinically indicated, consider ordering this additional test [id number] PLATELET COUNT CONFIRMED BY SMEAR    nRBC 0.0 0.0 - 0.2 %    Comment: Performed at [facility], [address], [place], [place] [address]  Basic metabolic panel     Status: Abnormal   Collection Time: 01/17/23  1:47 PM  Result Value Ref Range   Sodium 135 135 - 145 mmol/L   Potassium 4.0 3.5 - 5.1 mmol/L   Chloride 100 98 - 111 mmol/L   CO2 25 22 - 32 mmol/L   Glucose, Bld 91 70 - 99 mg/dL    Comment: Glucose reference range applies only to samples taken after fasting for at least 8 hours.   BUN 30 (H) 8 - 23 mg/dL   Creatinine, Ser 3.87 (H) 0.44 - 1.00 mg/dL   Calcium 9.0 8.9 - 56.4 mg/dL   GFR, Estimated 8 (L) >60 mL/min    Comment: (NOTE) Calculated using the CKD-EPI Creatinine Equation (2021)    Anion gap 10 5 - 15    Comment:  Performed at [facility], [address] [place], [place] [address]  VAS [place] ABI WITH/WO TBI     Status: None   Collection Time: 03/08/23 11:40 AM  Result Value Ref Range   Right ABI 0.56    Left ABI 0.62     Radiology VAS [place] ABI WITH/WO TBI  Result Date: 03/12/2023  LOWER EXTREMITY DOPPLER STUDY Patient Name:  Alexis Tucker  Date of Exam:   03/08/2023 Medical Rec #: [id number]             Accession #:    [id number] Date of Birth: 1940/07/11             Patient Gender: F Patient Age:   32 years Exam Location:  [facility] Procedure:      VAS [place] ABI WITH/WO TBI Referring Phys: Caryn Bee PATEL --------------------------------------------------------------------------------  Indications: Gangrene, and peripheral artery disease. ESRD High Risk         Hypertension, hyperlipidemia, Diabetes, past history of Factors:          smoking.  Performing Technologist: Hardie Lora RVT  Examination Guidelines: A complete evaluation includes at minimum, Doppler waveform signals and systolic blood pressure reading at the level of bilateral brachial, anterior tibial, and posterior tibial arteries, when vessel segments are accessible. Bilateral testing is considered an integral part of a complete examination. Photoelectric Plethysmograph (PPG) waveforms and toe systolic pressure readings are included as required and additional duplex testing as needed. Limited examinations for reoccurring indications may be performed as noted.  ABI Findings: +---------+------------------+-----+----------+--------+ Right    Rt Pressure (mmHg)IndexWaveform  Comment  +---------+------------------+-----+----------+--------+ Brachial                                  AVF      +---------+------------------+-----+----------+--------+ PTA      54  0.36 monophasic         +---------+------------------+-----+----------+--------+ DP       85                0.56 monophasic          +---------+------------------+-----+----------+--------+ Great Toe0                 0.00                    +---------+------------------+-----+----------+--------+ +---------+------------------+-----+----------+-------+ Left     Lt Pressure (mmHg)IndexWaveform  Comment +---------+------------------+-----+----------+-------+ Brachial 151                                      +---------+------------------+-----+----------+-------+ PTA      28                0.19 hyperemic         +---------+------------------+-----+----------+-------+ DP       93                0.62 monophasic        +---------+------------------+-----+----------+-------+ Great Toe21                0.14                   +---------+------------------+-----+----------+-------+ +-------+-----------+-----------+------------+------------+ ABI/TBIToday's ABIToday's TBIPrevious ABIPrevious TBI +-------+-----------+-----------+------------+------------+ Right  0.56       0.00                                +-------+-----------+-----------+------------+------------+ Left   0.62       0.14                                +-------+-----------+-----------+------------+------------+ Limited imaging showed severe calcified plaque in the SFA bilaterally. The right appears to be occluded.  Summary: Right: Resting right ankle-brachial index indicates moderate right lower extremity arterial disease. The right toe-brachial index is abnormal. Left: Resting left ankle-brachial index indicates moderate left lower extremity arterial disease. The left toe-brachial index is abnormal. *See table(s) above for measurements and observations.  Electronically signed by [name] MD on 03/12/2023 at 9:41:58 AM.    Final     Assessment/Plan  Atherosclerosis of native arteries of the extremities with gangrene [facility]) She had ABIs done in the past few days and has markedly reduced right ABI of 0.56 and left ABI of 0.62 with  undetectable digital pressure on the right and digital pressure of only 20 on the left. This is clearly a critical and limb threatening situation.  The patient has gangrenous changes to the great toes bilaterally and clear evidence of rest pain.  I discussed with she and her daughter today that without revascularization, limb loss would be expected and that would be the only way to alleviate pain if we are unable to revascularize her legs.  She does have multiple other medical issues including end-stage renal disease and will not be an optimal surgical candidate, but that may be what is required based on what we find at angiogram.  I have recommended angiography with possible revascularization of both lower extremities in a staged fashion.  I discussed the pathophysiology and natural history of peripheral arterial disease.  I have discussed the differences between percutaneous intervention and open surgical therapy.  She is currently on Eliquis.  She and her daughter voiced their understanding and desire to proceed with angiography with possible revascularization of both lower extremities in a staged fashion.  HTN (hypertension) blood pressure control important in reducing the progression of atherosclerotic disease. On appropriate oral medications.   Type 2 diabetes mellitus with other specified complication (HCC) blood glucose control important in reducing the progression of atherosclerotic disease. Also, involved in wound healing. On appropriate medications.   ESRD (end stage renal disease) on dialysis [facility]) PermCath is working well.  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  She will need to get her lower extremity peripheral arterial disease dealt with before we consider any left arm access.    Festus Barren, MD  03/13/2023 1:06 PM    This note was created with Dragon medical  transcription system.  Any errors from dictation are purely unintentional

## 2023-03-13 NOTE — Assessment & Plan Note (Signed)
She had ABIs done in the past few days and has markedly reduced right ABI of 0.56 and left ABI of 0.62 with undetectable digital pressure on the right and digital pressure of only 20 on the left. This is clearly a critical and limb threatening situation.  The patient has gangrenous changes to the great toes bilaterally and clear evidence of rest pain.  I discussed with she and her daughter today that without revascularization, limb loss would be expected and that would be the only way to alleviate pain if we are unable to revascularize her legs.  She does have multiple other medical issues including end-stage renal disease and will not be an optimal surgical candidate, but that may be what is required based on what we find at angiogram.  I have recommended angiography with possible revascularization of both lower extremities in a staged fashion.  I discussed the pathophysiology and natural history of peripheral arterial disease.  I have discussed the differences between percutaneous intervention and open surgical therapy.  She is currently on Eliquis.  She and her daughter voiced their understanding and desire to proceed with angiography with possible revascularization of both lower extremities in a staged fashion.

## 2023-03-13 NOTE — Patient Instructions (Signed)
Angiogram  An angiogram is a procedure used to examine the blood vessels. In this procedure, contrast dye is injected through a soft tube (catheter) into an artery. X-rays are then taken, which show if there is a blockage or problem in a blood vessel. The catheter may be inserted in: The groin area. This is the most common. The fold of the arm, near the elbow. The wrist. Tell a health care provider about: Any allergies you have, including allergies to medicines, shellfish, contrast dye, or iodine. All medicines you are taking, including vitamins, herbs, eye drops, creams, and over-the-counter medicines. Any problems you or family members have had with anesthesia. Any bleeding problems you have. Any surgeries you have had. Any medical conditions you have or have had, including any kidney problems or kidney failure. Whether you are pregnant or may be pregnant. Whether you are breastfeeding. Any condition that may increase your stress and prevent you from lying still. This includes anxiety disorders or chronic pain. What are the risks? Your health care provider will talk with you about risks. These may include: Infection. Bleeding and bruising. Allergic reactions to medicines or dyes, including the contrast dye used. Damage to nearby structures or organs, including damage to blood vessels and kidney damage from the contrast dye. Blood clots that can lead to a stroke or heart attack. Death. What happens before the procedure? When to stop eating and drinking Follow instructions from your health care provider about what you may eat and drink before your procedure. These may include: 8 hours before your procedure Stop eating most foods. Do not eat meat, fried foods, or fatty foods. Eat only light foods, such as toast or crackers. All liquids are okay except energy drinks and alcohol. 6 hours before your procedure Stop eating. Drink only clear liquids, such as water, clear fruit juice,  black coffee, plain tea, and sports drinks. Do not drink energy drinks or alcohol. 2 hours before your procedure Stop drinking all liquids. You may be allowed to take medicines with small sips of water. If you do not follow your health care provider's instructions, your procedure may be delayed or canceled. Medicines Ask your health care provider about: Changing or stopping your regular medicines. These include any diabetes medicines or blood thinners you take. Taking medicines such as aspirin and ibuprofen. These medicines can thin your blood. Do not take them unless your health care provider tells you to. Taking over-the-counter medicines, vitamins, herbs, and supplements. Surgery safety Ask your health care provider: How your insertion site will be marked. What steps will be taken to help prevent infection. These may include: Removing hair at the insertion site. Washing skin with a germ-killing soap. General instructions Do not use any products that contain nicotine or tobacco for at least 4 weeks before the procedure. These products include cigarettes, chewing tobacco, and vaping devices, such as e-cigarettes. If you need help quitting, ask your health care provider. You may have blood samples taken. If you will be going home right after the procedure, plan to have a responsible adult: Take you home from the hospital or clinic. You will not be allowed to drive. Care for you for the time you are told. What happens during the procedure? You will lie on your back on an X-ray table. You may be strapped to the table if it is tilted. An IV will be inserted into one of your veins. Electrodes may be placed on your chest to monitor your heart rate during the procedure.  You will be given one or both of the following: A sedative. This helps you relax. Anesthesia. This will numb the area where the catheter will be inserted. A small incision will be made for catheter insertion. The catheter  will be inserted into an artery using a guide wire. An X-ray procedure (fluoroscopy) will be used to help guide the catheter to the blood vessel to be examined. A contrast dye will then be injected into the catheter, and X-rays will be taken. The contrast will help to show where any narrowing or blockages are located in the blood vessels. You may feel flushed as the contrast dye is injected. Tell your health care provider if you develop chest pain or trouble breathing. After the fluoroscopy is complete, the catheter will be removed. Pressure will be applied to stop bleeding. A closure device may also be used. A bandage (dressing) will be placed over the site where the catheter was inserted. The procedure may vary among health care providers and hospitals. What happens after the procedure? Your blood pressure, heart rate, breathing rate, and blood oxygen level will be monitored until you leave the hospital or clinic. You will be kept in bed lying flat for a period of time. If the catheter was inserted through your leg, you will be instructed not to bend or cross your legs. The insertion area and the pulse in your feet or wrist will be checked often. You will be instructed to drink plenty of fluids. This will help wash the contrast dye out of your body. You may have more blood tests and X-rays. You may also have a test that records the electrical activity of your heart (electrocardiogram, or ECG). If you were given a sedative, do not drive or operate machinery until your health care provider says that it is safe. You may have to avoid lifting. Ask your health care provider how much you can safely lift. It is up to you to get your test results. Ask your health care provider, or the department that is doing the test, when your results will be ready. This information is not intended to replace advice given to you by your health care provider. Make sure you discuss any questions you have with your health  care provider. Document Revised: 01/11/2022 Document Reviewed: 01/11/2022 [employer] Patient Education  2024 [employer].

## 2023-03-13 NOTE — H&P (View-Only) (Signed)
MRN : [id number]  Alexis Tucker is a 82 y.o. (16-Aug-1940) female who presents with chief complaint of  Chief Complaint  Patient presents with   Follow-up    follow up  .  History of Present Illness: Patient returns today in follow up with a new issue of severe pain and discoloration of both feet.  She has noticed darkish discoloration of the great toes bilaterally with the 6 spreading up into the forefoot on each foot.  She has changes to the nailbed and the toenail on each great toe looks as if it is likely to no longer be viable.  She is having excruciating 10 out of 10 pain essentially around-the-clock.  Nothing is really helped this.  This is now been worsening over several weeks.  Her feet are equally painful and debilitating at this point.  She had ABIs done in the past few days and has markedly reduced right ABI of 0.56 and left ABI of 0.62 with undetectable digital pressure on the right and digital pressure of only 20 on the left. She has a PermCath in place for her chronic dialysis.  She previously had an access placed on her right arm but this had to be sacrificed due to steal syndrome.  Her PermCath is working well.  She has been pushed by the dialysis center to consider permanent access in the extremities, and understands this may be necessary in the future but wants her feet taken care of first which is appropriate.  Current Outpatient Medications  Medication Sig Dispense Refill   allopurinol (ZYLOPRIM) 100 MG tablet Take 1 tablet (100 mg total) by mouth daily. For gout 90 tablet 4   amLODipine (NORVASC) 10 MG tablet Take 10 mg by mouth daily.     anastrozole (ARIMIDEX) 1 MG tablet TAKE 1 TABLET BY MOUTH EVERY DAY 90 tablet 1   apixaban (ELIQUIS) 2.5 MG TABS tablet Take 1 tablet (2.5 mg total) by mouth 2 (two) times daily. As blood thinner 60 tablet 3   carvedilol (COREG) 25 MG tablet Take 0.5 tablets (12.5 mg total) by mouth 2 (two) times daily with a meal. 30 tablet 2    Cholecalciferol (VITAMIN D-3) 5000 UNITS TABS Take 1 tablet by mouth daily.     cloNIDine HCl (KAPVAY) 0.1 MG TB12 ER tablet Take 0.1 mg by mouth 2 (two) times daily. PRN     isosorbide mononitrate (IMDUR) 30 MG 24 hr tablet Take 1 tablet (30 mg total) by mouth daily. 90 tablet 3   levothyroxine (SYNTHROID) 88 MCG tablet Take 1 tablet (88 mcg total) by mouth daily. For hypothyroidism 90 tablet 3   linagliptin (TRADJENTA) 5 MG TABS tablet Take 1 tablet (5 mg total) by mouth daily. 90 tablet 3   pantoprazole (PROTONIX) 40 MG tablet Take 1 tablet (40 mg total) by mouth daily as needed. For reflux 90 tablet 4   sertraline (ZOLOFT) 25 MG tablet Take 1 tablet (25 mg total) by mouth daily. 90 tablet 3   No current facility-administered medications for this visit.    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   a.) Avastin injections as [facility]   DJD (degenerative joint disease)    ESRD (end stage renal disease) (HCC)    GERD (gastroesophageal reflux disease)    Glaucoma 03/2014   History of colon cancer 2006   a.) s/p colectomy   History of hepatitis A 1990s   from food, resolved   HTN (hypertension)    Hyperplastic colon polyp    Long term current use of anticoagulant    a.) apixaban   Long term current use of antithrombotics/antiplatelets    a.) pentoxifylline   Macular degeneration    wet R with vision loss, dry L; to see retina specialist   Neuropathy    OA (osteoarthritis)    Osteonecrosis (HCC)    hips?   PONV (postoperative nausea and vomiting)    Post-surgical hypothyroidism    Thrombocytopenia (HCC)    Tubular adenoma    Wears dentures    partial upper    Past Surgical History:  Procedure Laterality Date   A/V FISTULAGRAM Right 06/08/2022   Procedure: A/V Fistulagram;  Surgeon: Annice Needy, MD;  Location: [facility];  Service: Cardiovascular;  Laterality: Right;   ABDOMINAL HYSTERECTOMY  1976   partial, after tubal pregnancy, h/o fibroids with heavy bleeding   AV FISTULA PLACEMENT Right 02/01/2022   Procedure: INSERTION OF ARTERIOVENOUS (AV) GORE-TEX GRAFT ARM (LEFT UPPER EXTREMITY);  Surgeon: Annice Needy, MD;  Location: [facility] OR[facility];  Service: Vascular;  Laterality: Right;   BREAST BIOPSY Left 2008   positive   BREAST BIOPSY Left 11/22/2017   2oclock 2cmfn wing shaped clip, INVASIVE MAMMARY CARCINOMA   BREAST BIOPSY Left 11/22/2017   2oclock 4cmfn coil shaped clip, INVASIVE MAMMARY CARCINOMA   BREAST BIOPSY Left 11/22/2017   lymph node - butterfly hydroMARK   BREAST EXCISIONAL BIOPSY Left 01/07/2018   lumpectomy by Dr. Lemar Livings   BREAST LUMPECTOMY Left  2008   F/U radiation   BREAST LUMPECTOMY Left 2019   2 areas of [facility] at 2:00 position f/u with mammosite   BREAST LUMPECTOMY WITH SENTINEL LYMPH NODE BIOPSY Left 01/07/2018   Procedure: BREAST LUMPECTOMY WITH SENTINEL LYMPH NODE BX;  Surgeon: Earline Mayotte, MD;  Location: [facility];  Service: General;  Laterality: Left;   BREAST MAMMOSITE  2008   carotid ultrasound  12/2012   1-39% bilateral stenosis, severe in ECA   CATARACT EXTRACTION W/PHACO Left 08/14/2016   Procedure: CATARACT EXTRACTION PHACO AND INTRAOCULAR LENS PLACEMENT (IOC)  Left Diabetic;  Surgeon: Sherald Hess, MD;  Location:  [facility];  Service: Ophthalmology;  Laterality: Left;  Diabetic - oral meds   CHOLECYSTECTOMY  2005   COLECTOMY  2005   colon cancer   COLON SURGERY  2006   bowel obstruction   COLONOSCOPY  2008   ext hem, ileo-colonic anastomosis in cecum, tubular adenoma x1, rec rpt 1 yr for multiple polyps (in [place])   COLONOSCOPY WITH PROPOFOL N/A 10/19/2021   2.5cm TVA with low grade dysplasia, diverticulosis, no rpt recommended Wyline Mood, MD)   DIALYSIS/PERMA CATHETER INSERTION N/A 12/08/2022   Procedure: DIALYSIS/PERMA CATHETER INSERTION;  Surgeon: Renford Dills, MD;  Location: [facility];  Service: Cardiovascular;  Laterality: N/A;   ESOPHAGOGASTRODUODENOSCOPY N/A 05/06/2016   Procedure: ESOPHAGOGASTRODUODENOSCOPY (EGD);  Surgeon: Scot Jun, MD;  Location: [facility] END[facility];  Service: Endoscopy;  Laterality: N/A;   EYE SURGERY     JOINT REPLACEMENT     THYROIDECTOMY, PARTIAL     unclear why   TOTAL KNEE ARTHROPLASTY Right 1999   UPPER EXTREMITY ANGIOGRAPHY Right 05/18/2022   Procedure: Upper Extremity Angiography;  Surgeon: Annice Needy, MD;  Location: [facility];  Service: Cardiovascular;  Laterality: Right;   US ECHOCARDIOGRAPHY  11/2007   nl LV fxn EF 60%, diastolic dysfunction, LVH, tr TR and MR, slcerotic aortic valve   VEIN LIGATION AND STRIPPING        Social History   Tobacco Use   Smoking status: Former    Passive exposure: Past   Smokeless tobacco: Never   Tobacco comments:    quit over 40 yrs ago  Vaping Use   Vaping status: Never Used  Substance Use Topics   Alcohol use: Never    Comment: rare   Drug use: Never      Family History  Problem Relation Age of Onset   Diabetes Mother    Hypertension Mother    Arthritis Mother    Alcohol abuse Father    Arthritis Father    Stroke Daughter    CAD Neg Hx    Cancer Neg Hx    Breast cancer Neg Hx     Allergies  Allergen Reactions   Tramadol Other (See Comments)    nightmares   Codeine Anxiety   Sulfa Antibiotics Itching and Rash     REVIEW OF SYSTEMS (Negative unless checked)  Constitutional: [] Weight loss  [] Fever  [] Chills Cardiac: [] Chest pain   [] Chest pressure   [] Palpitations   [] Shortness of breath when laying flat   [] Shortness of breath at rest   [] Shortness of breath with exertion. Vascular:  [x] Pain in legs with walking   [] Pain in legs at rest   [] Pain in legs when laying flat   [] Claudication   [] Pain in feet when walking  [x] Pain in feet at rest  [x] Pain in feet when laying flat   [] History of DVT   [] Phlebitis   [] Swelling in legs   [] Varicose veins   [] Non-healing ulcers Pulmonary:   [] Uses home oxygen   [] Productive cough   [] Hemoptysis   [] Wheeze  [] COPD   [] Asthma Neurologic:  [] Dizziness  [] Blackouts   [] Seizures   [] History of stroke   [] History of TIA  [] Aphasia   [] Temporary blindness   [] Dysphagia   [] Weakness or numbness in arms   [] Weakness or numbness in legs Musculoskeletal:  [x] Arthritis   [] Joint swelling   [x] Joint pain   [] Low back pain Hematologic:  [] Easy bruising  [] Easy bleeding   [] Hypercoagulable state   [x] Anemic  Gastrointestinal:  [] Blood in stool   [] Vomiting blood  [x] Gastroesophageal reflux/heartburn   [] Abdominal pain Genitourinary:  [x] Chronic kidney disease   [] Difficult urination  [] Frequent urination  [] Burning  with urination   [] Hematuria Skin:  [] Rashes   [] Ulcers   [] Wounds Psychological:  [] History of anxiety   []  History of major depression.  Physical Examination  BP 130/64 (BP Location: Left Arm)   Pulse 73   Resp 18   Ht 5\' 3"  (1.6 m)   Wt 156 lb (70.8 kg)   BMI 27.63 kg/m  Gen:  WD/WN, NAD Head: [place]/AT, No temporalis wasting. Ear/Nose/Throat: Hearing grossly intact, nares w/o erythema or drainage Eyes: Conjunctiva clear. Sclera non-icteric Neck: Supple.  Trachea midline Pulmonary:  Good air movement, no use of accessory muscles.  Cardiac: irregular Vascular:  Vessel Right Left  Radial Palpable Palpable                          PT Not Palpable Not Palpable  DP Not Palpable Not Palpable   Gastrointestinal: soft, non-tender/non-distended. No guarding/reflex.  Musculoskeletal: M/S 5/5 throughout.  No deformity or atrophy.  Dark discoloration with dry gangrenous changes of both great toes with thickening of the nails.  Markedly diminished capillary refill in both forefeet.  Feet are somewhat cool.  1+ bilateral lower extremity edema. Neurologic: Sensation grossly intact in extremities.  Symmetrical.  Speech is fluent.  Psychiatric: Judgment intact, Mood & affect appropriate for pt's clinical situation. Dermatologic: No rashes or ulcers noted.  No cellulitis or open wounds.      Labs Recent Results (from the past 2160 hour(s))  Renal function panel     Status: Abnormal   Collection Time: 12/15/22  7:54 AM  Result Value Ref Range   Sodium 141 135 - 145 mmol/L   Potassium 3.7 3.5 - 5.1 mmol/L   Chloride 101 98 - 111 mmol/L   CO2 31 22 - 32 mmol/L   Glucose, Bld 120 (H) 70 - 99 mg/dL    Comment: Glucose reference range applies only to samples taken after fasting for at least 8 hours.   BUN 25 (H) 8 - 23 mg/dL   Creatinine, Ser 1.61 (H) 0.44 - 1.00 mg/dL   Calcium 9.3 8.9 - 09.6 mg/dL   Phosphorus 4.0 2.5 - 4.6 mg/dL   Albumin 3.1 (L) 3.5 - 5.0 g/dL   GFR, Estimated 9  (L) >60 mL/min    Comment: (NOTE) Calculated using the CKD-EPI Creatinine Equation (2021)    Anion gap 9 5 - 15    Comment: Performed at [facility], [address] [place], [place] [address]  CBC     Status: Abnormal   Collection Time: 12/15/22  7:54 AM  Result Value Ref Range   WBC 7.2 4.0 - 10.5 K/uL   RBC 2.96 (L) 3.87 - 5.11 MIL/uL   Hemoglobin 8.9 (L) 12.0 - 15.0 g/dL   HCT 98.1 (L) 19.1 - 47.8 %   MCV 96.3 80.0 - 100.0 fL   MCH 30.1 26.0 - 34.0 pg   MCHC 31.2 30.0 - 36.0 g/dL   RDW 29.5 62.1 - 30.8 %   Platelets 162 150 - 400 K/uL   nRBC 0.0 0.0 - 0.2 %    Comment: Performed at [facility], [address] [place], [place] [address]  CBC     Status: Abnormal   Collection Time: 01/17/23  1:47 PM  Result Value Ref Range   WBC  4.8 4.0 - 10.5 K/uL   RBC 3.88 3.87 - 5.11 MIL/uL   Hemoglobin 11.8 (L) 12.0 - 15.0 g/dL   HCT 16.1 09.6 - 04.5 %   MCV 97.2 80.0 - 100.0 fL   MCH 30.4 26.0 - 34.0 pg   MCHC 31.3 30.0 - 36.0 g/dL   RDW 40.9 81.1 - 91.4 %   Platelets 84 (L) 150 - 400 K/uL    Comment: Immature Platelet Fraction may be clinically indicated, consider ordering this additional test [id number] PLATELET COUNT CONFIRMED BY SMEAR    nRBC 0.0 0.0 - 0.2 %    Comment: Performed at [facility], [address] [place], [place] [address]  Basic metabolic panel     Status: Abnormal   Collection Time: 01/17/23  1:47 PM  Result Value Ref Range   Sodium 135 135 - 145 mmol/L   Potassium 4.0 3.5 - 5.1 mmol/L   Chloride 100 98 - 111 mmol/L   CO2 25 22 - 32 mmol/L   Glucose, Bld 91 70 - 99 mg/dL    Comment: Glucose reference range applies only to samples taken after fasting for at least 8 hours.   BUN 30 (H) 8 - 23 mg/dL   Creatinine, Ser 7.84 (H) 0.44 - 1.00 mg/dL   Calcium 9.0 8.9 - 69.6 mg/dL   GFR, Estimated 8 (L) >60 mL/min    Comment: (NOTE) Calculated using the CKD-EPI Creatinine Equation (2021)    Anion gap 10 5 - 15    Comment:  Performed at [facility], [address] [place], [place] [address]  VAS [place] ABI WITH/WO TBI     Status: None   Collection Time: 03/08/23 11:40 AM  Result Value Ref Range   Right ABI 0.56    Left ABI 0.62     Radiology VAS [place] ABI WITH/WO TBI  Result Date: 03/12/2023  LOWER EXTREMITY DOPPLER STUDY Patient Name:  Damayah Kightlinger  Date of Exam:   03/08/2023 Medical Rec #: [id number]             Accession #:    [id number] Date of Birth: 05-21-1941             Patient Gender: F Patient Age:   29 years Exam Location:  [facility] Procedure:      VAS [place] ABI WITH/WO TBI Referring Phys: Caryn Bee PATEL --------------------------------------------------------------------------------  Indications: Gangrene, and peripheral artery disease. ESRD High Risk         Hypertension, hyperlipidemia, Diabetes, past history of Factors:          smoking.  Performing Technologist: Hardie Lora RVT  Examination Guidelines: A complete evaluation includes at minimum, Doppler waveform signals and systolic blood pressure reading at the level of bilateral brachial, anterior tibial, and posterior tibial arteries, when vessel segments are accessible. Bilateral testing is considered an integral part of a complete examination. Photoelectric Plethysmograph (PPG) waveforms and toe systolic pressure readings are included as required and additional duplex testing as needed. Limited examinations for reoccurring indications may be performed as noted.  ABI Findings: +---------+------------------+-----+----------+--------+ Right    Rt Pressure (mmHg)IndexWaveform  Comment  +---------+------------------+-----+----------+--------+ Brachial                                  AVF      +---------+------------------+-----+----------+--------+ PTA      54  0.36 monophasic         +---------+------------------+-----+----------+--------+ DP       85                0.56 monophasic          +---------+------------------+-----+----------+--------+ Great Toe0                 0.00                    +---------+------------------+-----+----------+--------+ +---------+------------------+-----+----------+-------+ Left     Lt Pressure (mmHg)IndexWaveform  Comment +---------+------------------+-----+----------+-------+ Brachial 151                                      +---------+------------------+-----+----------+-------+ PTA      28                0.19 hyperemic         +---------+------------------+-----+----------+-------+ DP       93                0.62 monophasic        +---------+------------------+-----+----------+-------+ Great Toe21                0.14                   +---------+------------------+-----+----------+-------+ +-------+-----------+-----------+------------+------------+ ABI/TBIToday's ABIToday's TBIPrevious ABIPrevious TBI +-------+-----------+-----------+------------+------------+ Right  0.56       0.00                                +-------+-----------+-----------+------------+------------+ Left   0.62       0.14                                +-------+-----------+-----------+------------+------------+ Limited imaging showed severe calcified plaque in the SFA bilaterally. The right appears to be occluded.  Summary: Right: Resting right ankle-brachial index indicates moderate right lower extremity arterial disease. The right toe-brachial index is abnormal. Left: Resting left ankle-brachial index indicates moderate left lower extremity arterial disease. The left toe-brachial index is abnormal. *See table(s) above for measurements and observations.  Electronically signed by [name] MD on 03/12/2023 at 9:41:58 AM.    Final     Assessment/Plan  Atherosclerosis of native arteries of the extremities with gangrene [facility]) She had ABIs done in the past few days and has markedly reduced right ABI of 0.56 and left ABI of 0.62 with  undetectable digital pressure on the right and digital pressure of only 20 on the left. This is clearly a critical and limb threatening situation.  The patient has gangrenous changes to the great toes bilaterally and clear evidence of rest pain.  I discussed with she and her daughter today that without revascularization, limb loss would be expected and that would be the only way to alleviate pain if we are unable to revascularize her legs.  She does have multiple other medical issues including end-stage renal disease and will not be an optimal surgical candidate, but that may be what is required based on what we find at angiogram.  I have recommended angiography with possible revascularization of both lower extremities in a staged fashion.  I discussed the pathophysiology and natural history of peripheral arterial disease.  I have discussed the differences between percutaneous intervention and open surgical therapy.  She is currently on Eliquis.  She and her daughter voiced their understanding and desire to proceed with angiography with possible revascularization of both lower extremities in a staged fashion.  HTN (hypertension) blood pressure control important in reducing the progression of atherosclerotic disease. On appropriate oral medications.   Type 2 diabetes mellitus with other specified complication (HCC) blood glucose control important in reducing the progression of atherosclerotic disease. Also, involved in wound healing. On appropriate medications.   ESRD (end stage renal disease) on dialysis [facility]) PermCath is working well.  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  She will need to get her lower extremity peripheral arterial disease dealt with before we consider any left arm access.    Festus Barren, MD  03/13/2023 1:06 PM    This note was created with Dragon medical  transcription system.  Any errors from dictation are purely unintentional

## 2023-03-13 NOTE — Assessment & Plan Note (Signed)
blood pressure control important in reducing the progression of atherosclerotic disease. On appropriate oral medications.  

## 2023-03-13 NOTE — Assessment & Plan Note (Signed)
blood glucose control important in reducing the progression of atherosclerotic disease. Also, involved in wound healing. On appropriate medications.  

## 2023-03-14 ENCOUNTER — Telehealth (INDEPENDENT_AMBULATORY_CARE_PROVIDER_SITE_OTHER): Payer: Self-pay

## 2023-03-14 ENCOUNTER — Other Ambulatory Visit: Payer: Self-pay | Admitting: Podiatry

## 2023-03-14 DIAGNOSIS — I999 Unspecified disorder of circulatory system: Secondary | ICD-10-CM

## 2023-03-14 NOTE — Telephone Encounter (Signed)
Spoke with the patient's daughter and the patient is scheduled with Dr. Wyn Quaker on 03/22/23 (LLE angio) and 03/26/23 (RLE) at the [facility]. Pre-procedure instructions were discussed and will be sent to Mychart and mailed.

## 2023-03-16 DIAGNOSIS — N186 End stage renal disease: Secondary | ICD-10-CM | POA: Diagnosis not present

## 2023-03-16 DIAGNOSIS — D509 Iron deficiency anemia, unspecified: Secondary | ICD-10-CM | POA: Diagnosis not present

## 2023-03-16 DIAGNOSIS — N2581 Secondary hyperparathyroidism of renal origin: Secondary | ICD-10-CM | POA: Diagnosis not present

## 2023-03-16 DIAGNOSIS — Z992 Dependence on renal dialysis: Secondary | ICD-10-CM | POA: Diagnosis not present

## 2023-03-16 NOTE — Telephone Encounter (Signed)
Filled and in Lisa's box.

## 2023-03-16 NOTE — Telephone Encounter (Signed)
Daughter has picked up ppw and paid charges.

## 2023-03-16 NOTE — Telephone Encounter (Signed)
Spoke with pt's daughter, [name] (on dpr), notifying her the form is ready to pick up and there is a $29.00 fee to have it completed that will need to be paid at time of pick up.  [Placed form at front office (lg gold envelope) and made copy to scan.]

## 2023-03-17 ENCOUNTER — Other Ambulatory Visit: Payer: Self-pay

## 2023-03-17 ENCOUNTER — Emergency Department
Admission: EM | Admit: 2023-03-17 | Discharge: 2023-03-17 | Disposition: A | Discharge status: Home / Self Care | Payer: Medicare Other | Attending: Emergency Medicine | Admitting: Emergency Medicine

## 2023-03-17 ENCOUNTER — Emergency Department: Payer: Medicare Other

## 2023-03-17 DIAGNOSIS — E1140 Type 2 diabetes mellitus with diabetic neuropathy, unspecified: Secondary | ICD-10-CM | POA: Diagnosis not present

## 2023-03-17 DIAGNOSIS — I96 Gangrene, not elsewhere classified: Secondary | ICD-10-CM

## 2023-03-17 DIAGNOSIS — N186 End stage renal disease: Secondary | ICD-10-CM | POA: Insufficient documentation

## 2023-03-17 DIAGNOSIS — F0390 Unspecified dementia without behavioral disturbance: Secondary | ICD-10-CM | POA: Insufficient documentation

## 2023-03-17 DIAGNOSIS — Z992 Dependence on renal dialysis: Secondary | ICD-10-CM | POA: Diagnosis not present

## 2023-03-17 DIAGNOSIS — M7989 Other specified soft tissue disorders: Secondary | ICD-10-CM | POA: Diagnosis present

## 2023-03-17 DIAGNOSIS — E1122 Type 2 diabetes mellitus with diabetic chronic kidney disease: Secondary | ICD-10-CM | POA: Diagnosis not present

## 2023-03-17 DIAGNOSIS — S91109A Unspecified open wound of unspecified toe(s) without damage to nail, initial encounter: Secondary | ICD-10-CM | POA: Diagnosis not present

## 2023-03-17 DIAGNOSIS — I739 Peripheral vascular disease, unspecified: Secondary | ICD-10-CM | POA: Insufficient documentation

## 2023-03-17 DIAGNOSIS — E11319 Type 2 diabetes mellitus with unspecified diabetic retinopathy without macular edema: Secondary | ICD-10-CM | POA: Insufficient documentation

## 2023-03-17 NOTE — Discharge Instructions (Signed)
Keep the toes covered as directed.  Rest with feet elevated as needed to help reduce swelling.  Follow-up with your vascular surgeon and podiatrist as scheduled.

## 2023-03-17 NOTE — ED Triage Notes (Signed)
Pt to ed from home via POV for foot pain. Pt states "I was putting on a sock and the nail popped off my right great toe". Pt is caox4, in no acute distress and in wheel chair in triage.

## 2023-03-17 NOTE — ED Provider Notes (Signed)
[facility] Emergency Department Provider Note     Event Date/Time   First MD Initiated Contact with Patient 03/17/23 2222     (approximate)   History   Toe Pain (Right great)   HPI  Alexis Tucker is a 82 y.o. 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  Patient was apparently recently evaluated by the podiatrist, noted to have onycho lysis of the bilateral toes.  Patient also has known poor arterial blood flow to the feet and toes.  She apparently was putting on her shoes or socks yesterday when the nail of her great toe on the right foot popped off.  Patient denies any acute pain, purulent discharge, or toe swelling.  Physical Exam   Triage Vital Signs: ED Triage Vitals  Encounter Vitals Group     BP 03/17/23 1931 106/73     Systolic BP Percentile --      Diastolic BP Percentile --      Pulse Rate 03/17/23 1931 90     Resp 03/17/23 1931 16     Temp 03/17/23 1931 98 F (36.7 C)     Temp Source 03/17/23 1931 Oral     SpO2 03/17/23 1931 97 %     Weight 03/17/23 1929 154 lb 5.2 oz (70 kg)     Height 03/17/23 1929 5\' 3"  (1.6 m)     Head Circumference --      Peak Flow --      Pain Score --      Pain Loc --      Pain Education --      Exclude from Growth Chart --     Most recent vital signs: Vitals:   03/17/23 1931 03/17/23 2338  BP: 106/73 110/85  Pulse: 90 86  Resp: 16 15  Temp: 98 F (36.7 C) 98.2 F (36.8 C)  SpO2: 97% 96%    General Awake, no distress. NAD CV:  poor peripheral perfusion.  Ecchymosis and skin changes to the bilateral toes consistent with PAD. RESP:  Normal effort.  ABD:  No distention.  MSK:  bilateral great toes with onycholysis noted of the great toenails.  There is avulsion of the nail of the right great  toe consistent with history.  No purulence or drainage is noted.  Dried blood is noted at the distal nailbed.  Some mild soft tissue swelling and edema noted to the dorsal right foot.   ED Results / Procedures / Treatments   Labs (all labs ordered are listed, but only abnormal results are displayed) Labs Reviewed - No data to display   EKG  RADIOLOGY  I personally viewed and evaluated these images as part of my medical decision making, as well as reviewing the written report by the radiologist.  ED Provider Interpretation: No evidence of fracture or osteomyelitis  DG Foot Complete Right  Result Date: 03/17/2023 CLINICAL DATA:  First toe wound and nail avulsion, initial encounter EXAM: RIGHT FOOT COMPLETE - 3+ VIEW COMPARISON:  None Available. FINDINGS: No acute fracture or dislocation is noted. There are changes consistent with the given clinical history of nail avulsion of the first toe. No other focal abnormality is seen. IMPRESSION: No acute bony abnormality noted. Soft tissue changes consistent with the given clinical history. Electronically Signed   By:  Alcide Clever M.D.   On: 03/17/2023 23:02     PROCEDURES:  Critical Care performed: No  Procedures   MEDICATIONS ORDERED IN ED: Medications - No data to display   IMPRESSION / MDM / ASSESSMENT AND PLAN / ED COURSE  I reviewed the triage vital signs and the nursing notes.                              Differential diagnosis includes, but is not limited to, PAD, DVT, abscess, cellulitis, osteomyelitis, fracture, nail avulsion, onycholysis  Patient's presentation is most consistent with acute complicated illness / injury requiring diagnostic workup.  Patient's diagnosis is consistent with onycholysis of the right great toe with a history of PAD and gangrene.  No evidence of acute infectious process.  No radiologic evidence of any underlying gas or osteomyelitis.  Patient will be discharged home with wound care instructions.  Patient is to follow up with vascular surgery as scheduled, as needed or otherwise directed. Patient is given ED precautions to return to the ED for any worsening or new symptoms.     FINAL CLINICAL IMPRESSION(S) / ED DIAGNOSES   Final diagnoses:  Peripheral vascular disease of foot (HCC)  Gangrene of both feet (HCC)     Rx / DC Orders   ED Discharge Orders     None        Note:  This document was prepared using Dragon voice recognition software and may include unintentional dictation errors.    Lissa Hoard, PA-C 03/18/23 0014    Trinna Post, MD 03/18/23 [phone]

## 2023-03-17 NOTE — ED Notes (Signed)
Discharge instructions reviewed with patient. Patient questions answered and opportunity for education reviewed. Patient voices understanding of discharge instructions with no further questions. Patient ambulatory with steady gait to lobby.  

## 2023-03-19 DIAGNOSIS — Z992 Dependence on renal dialysis: Secondary | ICD-10-CM | POA: Diagnosis not present

## 2023-03-19 DIAGNOSIS — D509 Iron deficiency anemia, unspecified: Secondary | ICD-10-CM | POA: Diagnosis not present

## 2023-03-19 DIAGNOSIS — N186 End stage renal disease: Secondary | ICD-10-CM | POA: Diagnosis not present

## 2023-03-19 DIAGNOSIS — N2581 Secondary hyperparathyroidism of renal origin: Secondary | ICD-10-CM | POA: Diagnosis not present

## 2023-03-21 DIAGNOSIS — N2581 Secondary hyperparathyroidism of renal origin: Secondary | ICD-10-CM | POA: Diagnosis not present

## 2023-03-21 DIAGNOSIS — D509 Iron deficiency anemia, unspecified: Secondary | ICD-10-CM | POA: Diagnosis not present

## 2023-03-21 DIAGNOSIS — N186 End stage renal disease: Secondary | ICD-10-CM | POA: Diagnosis not present

## 2023-03-21 DIAGNOSIS — Z992 Dependence on renal dialysis: Secondary | ICD-10-CM | POA: Diagnosis not present

## 2023-03-22 ENCOUNTER — Ambulatory Visit
Admission: RE | Admit: 2023-03-22 | Discharge: 2023-03-22 | Disposition: A | Discharge status: Home / Self Care | Payer: Medicare Other | Attending: Vascular Surgery | Admitting: Vascular Surgery

## 2023-03-22 ENCOUNTER — Telehealth (INDEPENDENT_AMBULATORY_CARE_PROVIDER_SITE_OTHER): Payer: Self-pay

## 2023-03-22 ENCOUNTER — Encounter: Payer: Self-pay | Admitting: Vascular Surgery

## 2023-03-22 ENCOUNTER — Encounter
Admission: RE | Disposition: A | Discharge status: Home / Self Care | Payer: Self-pay | Source: Home / Self Care | Attending: Vascular Surgery

## 2023-03-22 ENCOUNTER — Other Ambulatory Visit: Payer: Self-pay

## 2023-03-22 DIAGNOSIS — Z7901 Long term (current) use of anticoagulants: Secondary | ICD-10-CM | POA: Diagnosis not present

## 2023-03-22 DIAGNOSIS — I5032 Chronic diastolic (congestive) heart failure: Secondary | ICD-10-CM | POA: Diagnosis not present

## 2023-03-22 DIAGNOSIS — Z7902 Long term (current) use of antithrombotics/antiplatelets: Secondary | ICD-10-CM | POA: Insufficient documentation

## 2023-03-22 DIAGNOSIS — Z7722 Contact with and (suspected) exposure to environmental tobacco smoke (acute) (chronic): Secondary | ICD-10-CM | POA: Insufficient documentation

## 2023-03-22 DIAGNOSIS — Z992 Dependence on renal dialysis: Secondary | ICD-10-CM | POA: Insufficient documentation

## 2023-03-22 DIAGNOSIS — I700 Atherosclerosis of aorta: Secondary | ICD-10-CM

## 2023-03-22 DIAGNOSIS — I132 Hypertensive heart and chronic kidney disease with heart failure and with stage 5 chronic kidney disease, or end stage renal disease: Secondary | ICD-10-CM | POA: Diagnosis not present

## 2023-03-22 DIAGNOSIS — N186 End stage renal disease: Secondary | ICD-10-CM | POA: Insufficient documentation

## 2023-03-22 DIAGNOSIS — Z87891 Personal history of nicotine dependence: Secondary | ICD-10-CM | POA: Diagnosis not present

## 2023-03-22 DIAGNOSIS — Z8249 Family history of ischemic heart disease and other diseases of the circulatory system: Secondary | ICD-10-CM | POA: Insufficient documentation

## 2023-03-22 DIAGNOSIS — Z833 Family history of diabetes mellitus: Secondary | ICD-10-CM | POA: Diagnosis not present

## 2023-03-22 DIAGNOSIS — I70263 Atherosclerosis of native arteries of extremities with gangrene, bilateral legs: Secondary | ICD-10-CM | POA: Diagnosis not present

## 2023-03-22 DIAGNOSIS — Z7984 Long term (current) use of oral hypoglycemic drugs: Secondary | ICD-10-CM | POA: Insufficient documentation

## 2023-03-22 DIAGNOSIS — E1122 Type 2 diabetes mellitus with diabetic chronic kidney disease: Secondary | ICD-10-CM | POA: Insufficient documentation

## 2023-03-22 DIAGNOSIS — I701 Atherosclerosis of renal artery: Secondary | ICD-10-CM | POA: Diagnosis not present

## 2023-03-22 DIAGNOSIS — E1152 Type 2 diabetes mellitus with diabetic peripheral angiopathy with gangrene: Secondary | ICD-10-CM | POA: Diagnosis not present

## 2023-03-22 DIAGNOSIS — E890 Postprocedural hypothyroidism: Secondary | ICD-10-CM | POA: Diagnosis not present

## 2023-03-22 DIAGNOSIS — I70269 Atherosclerosis of native arteries of extremities with gangrene, unspecified extremity: Secondary | ICD-10-CM

## 2023-03-22 HISTORY — PX: LOWER EXTREMITY ANGIOGRAPHY: CATH118251

## 2023-03-22 LAB — GLUCOSE, CAPILLARY: Glucose-Capillary: 92 mg/dL (ref 70–99)

## 2023-03-22 SURGERY — LOWER EXTREMITY ANGIOGRAPHY
Anesthesia: Moderate Sedation | Site: Leg Lower | Laterality: Left

## 2023-03-22 MED ORDER — MIDAZOLAM HCL 2 MG/ML PO SYRP: 8.0000 mg | ORAL_SOLUTION | Freq: Once | ORAL | Status: DC | PRN

## 2023-03-22 MED ORDER — CEFAZOLIN SODIUM-DEXTROSE 1-4 GM/50ML-% IV SOLN
INTRAVENOUS | Status: AC
  Filled 2023-03-22: qty 50

## 2023-03-22 MED ORDER — ACETAMINOPHEN 325 MG PO TABS: 650.0000 mg | ORAL_TABLET | ORAL | Status: DC | PRN

## 2023-03-22 MED ORDER — IODIXANOL 320 MG/ML IV SOLN
INTRAVENOUS | Status: DC | PRN
  Administered 2023-03-22: 70 mL

## 2023-03-22 MED ORDER — MIDAZOLAM HCL 2 MG/2ML IJ SOLN
INTRAMUSCULAR | Status: DC | PRN
  Administered 2023-03-22: .5 mg via INTRAVENOUS
  Administered 2023-03-22: 1 mg via INTRAVENOUS
  Administered 2023-03-22 (×2): .5 mg via INTRAVENOUS
  Administered 2023-03-22: 1 mg via INTRAVENOUS

## 2023-03-22 MED ORDER — SODIUM CHLORIDE 0.9 % IV SOLN: INTRAVENOUS | Status: DC

## 2023-03-22 MED ORDER — HYDRALAZINE HCL 20 MG/ML IJ SOLN: 5.0000 mg | INTRAMUSCULAR | Status: DC | PRN

## 2023-03-22 MED ORDER — METHYLPREDNISOLONE SODIUM SUCC 125 MG IJ SOLR: 125.0000 mg | Freq: Once | INTRAMUSCULAR | Status: DC | PRN

## 2023-03-22 MED ORDER — HYDROMORPHONE HCL 1 MG/ML IJ SOLN: 1.0000 mg | Freq: Once | INTRAMUSCULAR | Status: DC | PRN

## 2023-03-22 MED ORDER — SODIUM CHLORIDE 0.9 % IV SOLN: 250.0000 mL | INTRAVENOUS | Status: DC | PRN

## 2023-03-22 MED ORDER — FENTANYL CITRATE (PF) 100 MCG/2ML IJ SOLN
INTRAMUSCULAR | Status: DC | PRN
  Administered 2023-03-22 (×2): 25 ug via INTRAVENOUS
  Administered 2023-03-22: 50 ug via INTRAVENOUS

## 2023-03-22 MED ORDER — HEPARIN SODIUM (PORCINE) 1000 UNIT/ML IJ SOLN
INTRAMUSCULAR | Status: AC
  Filled 2023-03-22: qty 10

## 2023-03-22 MED ORDER — HEPARIN (PORCINE) IN NACL 1000-0.9 UT/500ML-% IV SOLN
INTRAVENOUS | Status: DC | PRN
  Administered 2023-03-22: 1000 mL

## 2023-03-22 MED ORDER — FENTANYL CITRATE PF 50 MCG/ML IJ SOSY
PREFILLED_SYRINGE | INTRAMUSCULAR | Status: AC
  Filled 2023-03-22: qty 1

## 2023-03-22 MED ORDER — ASPIRIN 81 MG PO TBEC: 81.0000 mg | DELAYED_RELEASE_TABLET | Freq: Every day | ORAL | Status: DC

## 2023-03-22 MED ORDER — LIDOCAINE-EPINEPHRINE (PF) 1 %-1:200000 IJ SOLN
INTRAMUSCULAR | Status: DC | PRN
  Administered 2023-03-22: 10 mL

## 2023-03-22 MED ORDER — SODIUM CHLORIDE 0.9% FLUSH: 3.0000 mL | INTRAVENOUS | Status: DC | PRN

## 2023-03-22 MED ORDER — MIDAZOLAM HCL 2 MG/2ML IJ SOLN
INTRAMUSCULAR | Status: AC
  Filled 2023-03-22: qty 2

## 2023-03-22 MED ORDER — DIPHENHYDRAMINE HCL 50 MG/ML IJ SOLN: 50.0000 mg | Freq: Once | INTRAMUSCULAR | Status: DC | PRN

## 2023-03-22 MED ORDER — SODIUM CHLORIDE 0.9% FLUSH: 3.0000 mL | Freq: Two times a day (BID) | INTRAVENOUS | Status: DC

## 2023-03-22 MED ORDER — ONDANSETRON HCL 4 MG/2ML IJ SOLN: 4.0000 mg | Freq: Four times a day (QID) | INTRAMUSCULAR | Status: DC | PRN

## 2023-03-22 MED ORDER — CEFAZOLIN SODIUM-DEXTROSE 1-4 GM/50ML-% IV SOLN
1.0000 g | INTRAVENOUS | Status: AC
  Administered 2023-03-22: 1 g via INTRAVENOUS

## 2023-03-22 MED ORDER — LABETALOL HCL 5 MG/ML IV SOLN: 10.0000 mg | INTRAVENOUS | Status: DC | PRN

## 2023-03-22 MED ORDER — ATORVASTATIN CALCIUM 10 MG PO TABS: 10.0000 mg | ORAL_TABLET | Freq: Every day | ORAL | Status: DC

## 2023-03-22 MED ORDER — ATORVASTATIN CALCIUM 10 MG PO TABS: 10.0000 mg | ORAL_TABLET | Freq: Every day | ORAL | 11 refills | Status: DC

## 2023-03-22 MED ORDER — ASPIRIN 81 MG PO TBEC: 81.0000 mg | DELAYED_RELEASE_TABLET | Freq: Every day | ORAL | 2 refills | Status: DC

## 2023-03-22 MED ORDER — HEPARIN SODIUM (PORCINE) 1000 UNIT/ML IJ SOLN
INTRAMUSCULAR | Status: DC | PRN
  Administered 2023-03-22: 5000 [IU] via INTRAVENOUS

## 2023-03-22 MED ORDER — FAMOTIDINE 20 MG PO TABS: 40.0000 mg | ORAL_TABLET | Freq: Once | ORAL | Status: DC | PRN

## 2023-03-22 SURGICAL SUPPLY — 31 items
BALLN LUTONIX 018 4X220X130 (BALLOONS) ×1
BALLN LUTONIX 018 4X300X130 (BALLOONS) ×1
BALLN LUTONIX 018 5X300X130 (BALLOONS) ×1
BALLN LUTONIX DCB 7X40X130 (BALLOONS) ×1
BALLN ULTRVRSE 2X300X150 (BALLOONS) ×1
BALLN ULTRVRSE 2X300X150 OTW (BALLOONS) ×1
BALLN ULTRVRSE 3X100X150 (BALLOONS) ×1
BALLOON LUTONIX 018 4X220X130 (BALLOONS) IMPLANT
BALLOON LUTONIX 018 4X300X130 (BALLOONS) IMPLANT
BALLOON LUTONIX 018 5X300X130 (BALLOONS) IMPLANT
BALLOON LUTONIX DCB 7X40X130 (BALLOONS) IMPLANT
BALLOON ULTRVRSE 2X300X150 OTW (BALLOONS) IMPLANT
BALLOON ULTRVRSE 3X100X150 (BALLOONS) IMPLANT
CATH ANGIO 5F PIGTAIL 65CM (CATHETERS) IMPLANT
CATH NAVICROSS ANGLED 135CM (MICROCATHETER) IMPLANT
CATH SEEKER .018X150 (CATHETERS) IMPLANT
CATH VERT 5X100 (CATHETERS) IMPLANT
COVER PROBE ULTRASOUND 5X96 (MISCELLANEOUS) IMPLANT
DEVICE STARCLOSE SE CLOSURE (Vascular Products) IMPLANT
GLIDEWIRE ADV .035X260CM (WIRE) IMPLANT
KIT ENCORE 26 ADVANTAGE (KITS) IMPLANT
PACK ANGIOGRAPHY (CUSTOM PROCEDURE TRAY) ×1 IMPLANT
SHEATH ANL2 6FRX45 HC (SHEATH) IMPLANT
SHEATH BRITE TIP 5FRX11 (SHEATH) IMPLANT
STENT LIFESTAR 8X40 (Permanent Stent) IMPLANT
STENT VIABAHN 6X100X120 (Permanent Stent) IMPLANT
STENT VIABAHN 6X250X120 (Permanent Stent) IMPLANT
SYR MEDRAD MARK 7 150ML (SYRINGE) IMPLANT
TUBING CONTRAST HIGH PRESS 72 (TUBING) IMPLANT
WIRE G V18X300CM (WIRE) IMPLANT
WIRE GUIDERIGHT .035X150 (WIRE) IMPLANT

## 2023-03-22 NOTE — Telephone Encounter (Signed)
Pt's daughter LVM stating that the pt had an angio this am with Dew and she forgot to ask when the pt should start back taking her Eliquis also should pt take the baby aspirin with the eliquis daughter states she don't think that is safe.  Please advise

## 2023-03-22 NOTE — Op Note (Signed)
[facility]  Percutaneous Study/Intervention Procedural Note   Date of Surgery: 03/22/2023  Surgeon(s):Jacquez Sheetz    Assistants:none  Pre-operative Diagnosis: PAD with gangrene BLE  Post-operative diagnosis:  Same  Procedure(s) Performed:             1.  Ultrasound guidance for vascular access right femoral artery             2.  Catheter placement into left common femoral artery from right femoral approach             3.  Aortogram and selective left lower extremity angiogram             4.  Percutaneous transluminal angioplasty of left posterior tibial artery and TP trunk with 2 and 3 mm balloon             5.  Percutaneous transluminal angioplasty of the left SFA and popliteal arteries with 4 mm diameter Lutonix drug-coated balloons.  6.  Viabahn stent placement to the left SFA and most proximal popliteal artery with a 6 mm diameter by 25 cm length and a 6 mm diameter by 10 cm length Viabahn stent  7.  Stent placement to the left external iliac artery with 8 mm diameter by 4 cm length life star stent             8.  StarClose closure device right femoral artery  EBL: 5 cc  Contrast: 70 cc  Fluoro Time: 11.4 minutes  Moderate Conscious Sedation Time: approximately 63 minutes using 3.5 mg of Versed and 100 mcg of Fentanyl              Indications:  Patient is a 82 y.o.female with gangrenous changes to both feet. The patient has noninvasive study showing severe PAD with markedly reduced flow. The patient is brought in for angiography for further evaluation and potential treatment.  Due to the limb threatening nature of the situation, angiogram was performed for attempted limb salvage. The patient is aware that if the procedure fails, amputation would be expected.  The patient also understands that even with successful revascularization, amputation may still be required due to the severity of the situation.  Risks and benefits are discussed and informed consent  is obtained.   Procedure:  The patient was identified and appropriate procedural time out was performed.  The patient was then placed supine on the table and prepped and draped in the usual sterile fashion. Moderate conscious sedation was administered during a face to face encounter with the patient throughout the procedure with my supervision of the RN administering medicines and monitoring the patient's vital signs, pulse oximetry, telemetry and mental status throughout from the start of the procedure until the patient was taken to the recovery room. Ultrasound was used to evaluate the right common femoral artery.  It was patent .  A digital ultrasound image was acquired.  A Seldinger needle was used to access the right common femoral artery under direct ultrasound guidance and a permanent image was performed.  A 0.035 J wire was advanced without resistance and a 5Fr sheath was placed.  Pigtail catheter was placed into the aorta and an AP aortogram was performed. This demonstrated mildly diseased renal arteries and a calcified but not stenotic aorta.  The right iliac system does not show any obvious stenosis but the iliac bifurcation was somewhat difficult to see in the AP projection.  The left common iliac artery had minimal disease but the left  external iliac artery had about an 80 to 85% stenosis in the mid to distal segment. I then crossed the aortic bifurcation and advanced to the left femoral head. Selective left lower extremity angiogram was then performed. This demonstrated extensive and diffuse disease.  The profunda femoris artery was heavily diseased out of the distal portions.  The SFA was extensively diseased throughout.  In the proximal to mid segment was a near occlusive stenosis and then the distal segment was a near occlusive stenosis.  The popliteal artery was moderately diseased.  The tibioperoneal trunk occluded and all 3 tibial vessels were occluded and there was really not inline flow  through any of the vessels into the foot. It was felt that it was in the patient's best interest to proceed with intervention after these images to avoid a second procedure and a larger amount of contrast and fluoroscopy based off of the findings from the initial angiogram. The patient was systemically heparinized and a 6 [place] Ansell sheath was then placed over the [employer] wire. I then used a Kumpe catheter and the advantage wire to navigate through the iliac disease, SFA and popliteal lesions.  I was able to navigate down into the tibioperoneal trunk where selective imaging was performed and I exchanged for a seeker catheter and a 0.018 V18 wire.  I was able to navigate this wire down to the mid to distal posterior tibial artery where it terminated but there were fairly extensive collaterals.  I then proceeded with treatment.  A 2 mm diameter by 30 cm length angioplasty balloon was inflated down to the distal posterior tibial artery and up through the tibioperoneal trunk.  This was used to predilate the very calcific lesions in the SFA and popliteal artery as well.  A 4 mm diameter by 30 and then a 4 mm diameter by 22 cm length Lutonix drug-coated angioplasty balloon were used to treat the entirety of the SFA and the popliteal artery down to the distal segment.  Completion imaging following this showed high-grade residual stenosis in the tibioperoneal trunk and most proximal posterior tibial artery and multiple areas of high-grade residual stenosis in the SFA.  I upsized to a 3 mm diameter by 10 cm length angioplasty balloon in the tibioperoneal trunk and most proximal posterior tibial artery and then placed a 6 mm diameter by 25 cm length and a 6 mm diameter by 10 cm length Viabahn stent from the proximal popliteal artery up to the proximal SFA.  The Viabahn stents were postdilated with 5 mm diameter Lutonix drug-coated balloons with excellent angiographic completion result and less than 10% residual  stenosis.  The tibioperoneal trunk and proximal posterior tibial artery appeared to have about a 30% residual stenosis that was not flow-limiting.  I then turned my attention to the iliac lesion.  The sheath was pulled back and imaging was performed in the RAO projection.  An 8 mm diameter by 4 cm length life star stent was then deployed in the left external iliac artery and postdilated with a 7 mm diameter by 4 cm length Lutonix drug-coated balloon.  Completion imaging showed no significant residual stenosis in the iliac artery after stent placement. I elected to terminate the procedure. The sheath was removed and StarClose closure device was deployed in the right femoral artery with excellent hemostatic result. The patient was taken to the recovery room in stable condition having tolerated the procedure well.  Findings:  Aortogram:  This demonstrated mildly diseased renal arteries and a calcified but not stenotic aorta.  The right iliac system does not show any obvious stenosis but the iliac bifurcation was somewhat difficult to see in the AP projection.  The left common iliac artery had minimal disease but the left external iliac artery had about an 80 to 85% stenosis in the mid to distal segment.             Left Lower Extremity:  This demonstrated extensive and diffuse disease.  The profunda femoris artery was heavily diseased out of the distal portions.  The SFA was extensively diseased throughout.  In the proximal to mid segment was a near occlusive stenosis and then the distal segment was a near occlusive stenosis.  The popliteal artery was moderately diseased.  The tibioperoneal trunk occluded and all 3 tibial vessels were occluded and there was really not inline flow through any of the vessels into the foot   Disposition: Patient was taken to the recovery room in stable condition having tolerated the procedure well.  Complications: None  Festus Barren 03/22/2023 12:38 PM   This note  was created with Dragon Medical transcription system. Any errors in dictation are purely unintentional.

## 2023-03-22 NOTE — Interval H&P Note (Signed)
History and Physical Interval Note:  03/22/2023 9:45 AM  Versa Loletha Grayer  has presented today for surgery, with the diagnosis of LLE Angio   ASO w gangrene.  The various methods of treatment have been discussed with the patient and family. After consideration of risks, benefits and other options for treatment, the patient has consented to  Procedure(s): Lower Extremity Angiography (Left) as a surgical intervention.  The patient's history has been reviewed, patient examined, no change in status, stable for surgery.  I have reviewed the patient's chart and labs.  Questions were answered to the patient's satisfaction.     Festus Barren

## 2023-03-23 ENCOUNTER — Encounter: Payer: Self-pay | Admitting: Vascular Surgery

## 2023-03-23 DIAGNOSIS — N186 End stage renal disease: Secondary | ICD-10-CM | POA: Diagnosis not present

## 2023-03-23 DIAGNOSIS — D509 Iron deficiency anemia, unspecified: Secondary | ICD-10-CM | POA: Diagnosis not present

## 2023-03-23 DIAGNOSIS — N2581 Secondary hyperparathyroidism of renal origin: Secondary | ICD-10-CM | POA: Diagnosis not present

## 2023-03-23 DIAGNOSIS — Z992 Dependence on renal dialysis: Secondary | ICD-10-CM | POA: Diagnosis not present

## 2023-03-23 NOTE — Telephone Encounter (Signed)
This was taken care of

## 2023-03-28 DIAGNOSIS — N186 End stage renal disease: Secondary | ICD-10-CM | POA: Diagnosis not present

## 2023-03-28 DIAGNOSIS — D509 Iron deficiency anemia, unspecified: Secondary | ICD-10-CM | POA: Diagnosis not present

## 2023-03-28 DIAGNOSIS — N2581 Secondary hyperparathyroidism of renal origin: Secondary | ICD-10-CM | POA: Diagnosis not present

## 2023-03-28 DIAGNOSIS — Z992 Dependence on renal dialysis: Secondary | ICD-10-CM | POA: Diagnosis not present

## 2023-03-29 ENCOUNTER — Encounter
Admission: RE | Disposition: A | Discharge status: Home / Self Care | Payer: Self-pay | Source: Home / Self Care | Attending: Vascular Surgery

## 2023-03-29 ENCOUNTER — Ambulatory Visit
Admission: RE | Admit: 2023-03-29 | Discharge: 2023-03-29 | Disposition: A | Discharge status: Home / Self Care | Payer: Medicare Other | Attending: Vascular Surgery | Admitting: Vascular Surgery

## 2023-03-29 ENCOUNTER — Encounter: Payer: Self-pay | Admitting: Vascular Surgery

## 2023-03-29 ENCOUNTER — Other Ambulatory Visit: Payer: Self-pay

## 2023-03-29 ENCOUNTER — Ambulatory Visit: Payer: Medicare Other | Admitting: Podiatry

## 2023-03-29 DIAGNOSIS — I132 Hypertensive heart and chronic kidney disease with heart failure and with stage 5 chronic kidney disease, or end stage renal disease: Secondary | ICD-10-CM | POA: Diagnosis not present

## 2023-03-29 DIAGNOSIS — I70269 Atherosclerosis of native arteries of extremities with gangrene, unspecified extremity: Secondary | ICD-10-CM

## 2023-03-29 DIAGNOSIS — N186 End stage renal disease: Secondary | ICD-10-CM | POA: Insufficient documentation

## 2023-03-29 DIAGNOSIS — Z7984 Long term (current) use of oral hypoglycemic drugs: Secondary | ICD-10-CM | POA: Diagnosis not present

## 2023-03-29 DIAGNOSIS — I701 Atherosclerosis of renal artery: Secondary | ICD-10-CM | POA: Diagnosis not present

## 2023-03-29 DIAGNOSIS — I5032 Chronic diastolic (congestive) heart failure: Secondary | ICD-10-CM | POA: Diagnosis not present

## 2023-03-29 DIAGNOSIS — Z992 Dependence on renal dialysis: Secondary | ICD-10-CM | POA: Insufficient documentation

## 2023-03-29 DIAGNOSIS — Z9889 Other specified postprocedural states: Secondary | ICD-10-CM | POA: Diagnosis not present

## 2023-03-29 DIAGNOSIS — I70263 Atherosclerosis of native arteries of extremities with gangrene, bilateral legs: Secondary | ICD-10-CM | POA: Insufficient documentation

## 2023-03-29 DIAGNOSIS — Z95828 Presence of other vascular implants and grafts: Secondary | ICD-10-CM

## 2023-03-29 DIAGNOSIS — E1122 Type 2 diabetes mellitus with diabetic chronic kidney disease: Secondary | ICD-10-CM | POA: Insufficient documentation

## 2023-03-29 DIAGNOSIS — Z96651 Presence of right artificial knee joint: Secondary | ICD-10-CM

## 2023-03-29 DIAGNOSIS — Z7901 Long term (current) use of anticoagulants: Secondary | ICD-10-CM | POA: Insufficient documentation

## 2023-03-29 DIAGNOSIS — E1152 Type 2 diabetes mellitus with diabetic peripheral angiopathy with gangrene: Secondary | ICD-10-CM | POA: Insufficient documentation

## 2023-03-29 HISTORY — PX: LOWER EXTREMITY ANGIOGRAPHY: CATH118251

## 2023-03-29 LAB — POTASSIUM (ARMC VASCULAR LAB ONLY): Potassium (ARMC vascular lab): 3.3 mmol/L — ABNORMAL LOW (ref 3.5–5.1)

## 2023-03-29 LAB — GLUCOSE, CAPILLARY: Glucose-Capillary: 113 mg/dL — ABNORMAL HIGH (ref 70–99)

## 2023-03-29 SURGERY — LOWER EXTREMITY ANGIOGRAPHY
Anesthesia: Moderate Sedation | Site: Leg Lower | Laterality: Right

## 2023-03-29 MED ORDER — CEFAZOLIN SODIUM-DEXTROSE 1-4 GM/50ML-% IV SOLN
INTRAVENOUS | Status: AC
  Filled 2023-03-29: qty 50

## 2023-03-29 MED ORDER — HEPARIN SODIUM (PORCINE) 1000 UNIT/ML IJ SOLN
INTRAMUSCULAR | Status: AC
  Filled 2023-03-29: qty 10

## 2023-03-29 MED ORDER — FAMOTIDINE 20 MG PO TABS: 40.0000 mg | ORAL_TABLET | Freq: Once | ORAL | Status: DC | PRN

## 2023-03-29 MED ORDER — MIDAZOLAM HCL 2 MG/ML PO SYRP: 8.0000 mg | ORAL_SOLUTION | Freq: Once | ORAL | Status: DC | PRN

## 2023-03-29 MED ORDER — FENTANYL CITRATE (PF) 100 MCG/2ML IJ SOLN
INTRAMUSCULAR | Status: DC | PRN
  Administered 2023-03-29: 50 ug via INTRAVENOUS
  Administered 2023-03-29: 25 ug via INTRAVENOUS

## 2023-03-29 MED ORDER — MIDAZOLAM HCL 2 MG/2ML IJ SOLN
INTRAMUSCULAR | Status: DC | PRN
  Administered 2023-03-29 (×2): 1 mg via INTRAVENOUS

## 2023-03-29 MED ORDER — DIPHENHYDRAMINE HCL 50 MG/ML IJ SOLN: 50.0000 mg | Freq: Once | INTRAMUSCULAR | Status: DC | PRN

## 2023-03-29 MED ORDER — METHYLPREDNISOLONE SODIUM SUCC 125 MG IJ SOLR: 125.0000 mg | Freq: Once | INTRAMUSCULAR | Status: DC | PRN

## 2023-03-29 MED ORDER — HYDROMORPHONE HCL 1 MG/ML IJ SOLN: 1.0000 mg | Freq: Once | INTRAMUSCULAR | Status: DC | PRN

## 2023-03-29 MED ORDER — IODIXANOL 320 MG/ML IV SOLN
INTRAVENOUS | Status: DC | PRN
  Administered 2023-03-29: 55 mL via INTRA_ARTERIAL

## 2023-03-29 MED ORDER — FENTANYL CITRATE (PF) 100 MCG/2ML IJ SOLN
INTRAMUSCULAR | Status: AC
  Filled 2023-03-29: qty 2

## 2023-03-29 MED ORDER — ONDANSETRON HCL 4 MG/2ML IJ SOLN: 4.0000 mg | Freq: Four times a day (QID) | INTRAMUSCULAR | Status: DC | PRN

## 2023-03-29 MED ORDER — CEFAZOLIN SODIUM-DEXTROSE 1-4 GM/50ML-% IV SOLN
1.0000 g | INTRAVENOUS | Status: AC
  Administered 2023-03-29: 1 g via INTRAVENOUS

## 2023-03-29 MED ORDER — SODIUM CHLORIDE 0.9 % IV SOLN: INTRAVENOUS | Status: DC

## 2023-03-29 MED ORDER — MIDAZOLAM HCL 5 MG/5ML IJ SOLN
INTRAMUSCULAR | Status: AC
  Filled 2023-03-29: qty 5

## 2023-03-29 MED ORDER — LIDOCAINE-EPINEPHRINE (PF) 1 %-1:200000 IJ SOLN
INTRAMUSCULAR | Status: DC | PRN
  Administered 2023-03-29: 10 mL

## 2023-03-29 MED ORDER — HEPARIN SODIUM (PORCINE) 1000 UNIT/ML IJ SOLN
INTRAMUSCULAR | Status: DC | PRN
  Administered 2023-03-29: 5000 [IU] via INTRAVENOUS

## 2023-03-29 MED ORDER — HEPARIN (PORCINE) IN NACL 2000-0.9 UNIT/L-% IV SOLN
INTRAVENOUS | Status: DC | PRN
  Administered 2023-03-29: 1000 mL

## 2023-03-29 SURGICAL SUPPLY — 14 items
CATH ANGIO 5F PIGTAIL 65CM (CATHETERS) IMPLANT
CATH BEACON 5 .038 100 VERT TP (CATHETERS) IMPLANT
COVER PROBE ULTRASOUND 5X96 (MISCELLANEOUS) IMPLANT
DEVICE STARCLOSE SE CLOSURE (Vascular Products) IMPLANT
GLIDEWIRE ADV .035X260CM (WIRE) IMPLANT
GUIDEWIRE PFTE-COATED .018X300 (WIRE) IMPLANT
PACK ANGIOGRAPHY (CUSTOM PROCEDURE TRAY) ×1 IMPLANT
SHEATH ANL2 6FRX45 HC (SHEATH) IMPLANT
SHEATH BRITE TIP 5FRX11 (SHEATH) IMPLANT
SYR MEDRAD MARK 7 150ML (SYRINGE) IMPLANT
TUBING CONTRAST HIGH PRESS 72 (TUBING) IMPLANT
WIRE G V18X300CM (WIRE) IMPLANT
WIRE GUIDERIGHT .035X150 (WIRE) IMPLANT
WIRE SHEPHERD 30G .018 (WIRE) IMPLANT

## 2023-03-29 NOTE — Op Note (Signed)
[facility]  Percutaneous Study/Intervention Procedural Note   Date of Surgery: 03/29/2023  Surgeon(s):Aunesti Pellegrino    Assistants:none  Pre-operative Diagnosis: PAD with rest pain and gangrenous changes bilateral lower extremities  Post-operative diagnosis:  Same  Procedure(s) Performed:             1.  Ultrasound guidance for vascular access left femoral artery             2.  Catheter placement into right superficial femoral artery from left femoral approach             3.  Aortogram and selective right lower extremity angiogram             4.  StarClose closure device left femoral artery  EBL: 10 cc  Contrast: 55 cc  Fluoro Time: 24.7 minutes  Moderate Conscious Sedation Time: approximately 57 minutes using 2 mg of Versed and 75 mcg of Fentanyl              Indications:  Patient is a 82 y.o.female with rest pain and gangrenous changes to both feet.  She has undergone left lower extremity revascularization previously.. The patient has noninvasive study showing markedly reduced flow bilaterally. The patient is brought in for angiography for further evaluation and potential treatment.  Due to the limb threatening nature of the situation, angiogram was performed for attempted limb salvage. The patient is aware that if the procedure fails, amputation would be expected.  The patient also understands that even with successful revascularization, amputation may still be required due to the severity of the situation.  Risks and benefits are discussed and informed consent is obtained.   Procedure:  The patient was identified and appropriate procedural time out was performed.  The patient was then placed supine on the table and prepped and draped in the usual sterile fashion. Moderate conscious sedation was administered during a face to face encounter with the patient throughout the procedure with my supervision of the RN administering medicines and monitoring the patient's  vital signs, pulse oximetry, telemetry and mental status throughout from the start of the procedure until the patient was taken to the recovery room. Ultrasound was used to evaluate the left common femoral artery.  It was patent .  A digital ultrasound image was acquired.  A Seldinger needle was used to access the left common femoral artery under direct ultrasound guidance and a permanent image was performed.  A 0.035 J wire was advanced without resistance and a 5Fr sheath was placed.  Pigtail catheter was placed into the aorta and an AP aortogram was performed. This demonstrated mildly diseased renal arteries and normal aorta and iliac segments without significant stenosis after stenting of the left iliac artery previously. I then crossed the aortic bifurcation and advanced to the right femoral head. Selective right lower extremity angiogram was then performed. This demonstrated occlusion of the proximal SFA about 5 to 7 cm beyond the origin.  There were large extensive collaterals consistent with a very chronic occlusion.  There was reconstitution of the diseased above-knee popliteal artery that was highly calcific and had disease throughout its course.  The popliteal artery was somewhat difficult to image due to her knee prosthesis.  Her peroneal artery was the only runoff distally and did not have any obvious focal stenosis although it did occlude in the distal segment and only collaterals of the peroneal artery filled the foot.  There was occlusion of the anterior tibial and posterior tibial arteries  without distal reconstitution. It was felt that it was in the patient's best interest to proceed with intervention after these images to avoid a second procedure and a larger amount of contrast and fluoroscopy based off of the findings from the initial angiogram. The patient was systemically heparinized and a 6 [place] Ansell sheath was then placed over the [employer] wire. I then used a Kumpe catheter and the  advantage wire to get into the occlusion in the SFA.  Tediously I tried to cross the occlusion with a variety of wires including a 0.035 advantage wire, a 0.018 advantage wire, as well as a shepherd wire.  Despite multiple different angles and efforts I was never able to cross back and get into the true lumen of the popliteal artery.  Once we had created a longstanding dissection plane and had used over 20 minutes of fluoroscopy it was clear this was not going to be a successful endeavor today. I elected to terminate the procedure. The sheath was removed and StarClose closure device was deployed in the left femoral artery with excellent hemostatic result. The patient was taken to the recovery room in stable condition having tolerated the procedure well.  Findings:               Aortogram:  This demonstrated mildly diseased renal arteries and normal aorta and iliac segments without significant stenosis after stenting of the left iliac artery previously.             Right Lower Extremity:  This demonstrated occlusion of the proximal SFA about 5 to 7 cm beyond the origin.  There were large extensive collaterals consistent with a very chronic occlusion.  There was reconstitution of the diseased above-knee popliteal artery that was highly calcific and had disease throughout its course.  The popliteal artery was somewhat difficult to image due to her knee prosthesis.  Her peroneal artery was the only runoff distally and did not have any obvious focal stenosis although it did occlude in the distal segment and only collaterals of the peroneal artery filled the foot.  There was occlusion of the anterior tibial and posterior tibial arteries without distal reconstitution.   Disposition: Patient was taken to the recovery room in stable condition having tolerated the procedure well.  Complications: None  Festus Barren 03/29/2023 2:55 PM   This note was created with Dragon Medical transcription system. Any errors in  dictation are purely unintentional.

## 2023-03-29 NOTE — Interval H&P Note (Signed)
History and Physical Interval Note:  03/29/2023 12:28 PM  Alexis Tucker  has presented today for surgery, with the diagnosis of RLE Angio   ASO w gangrene.  The various methods of treatment have been discussed with the patient and family. After consideration of risks, benefits and other options for treatment, the patient has consented to  Procedure(s): Lower Extremity Angiography (Right) as a surgical intervention.  The patient's history has been reviewed, patient examined, no change in status, stable for surgery.  I have reviewed the patient's chart and labs.  Questions were answered to the patient's satisfaction.     Festus Barren

## 2023-03-30 ENCOUNTER — Encounter: Payer: Self-pay | Admitting: Vascular Surgery

## 2023-03-30 DIAGNOSIS — D509 Iron deficiency anemia, unspecified: Secondary | ICD-10-CM | POA: Diagnosis not present

## 2023-03-30 DIAGNOSIS — N186 End stage renal disease: Secondary | ICD-10-CM | POA: Diagnosis not present

## 2023-03-30 DIAGNOSIS — N2581 Secondary hyperparathyroidism of renal origin: Secondary | ICD-10-CM | POA: Diagnosis not present

## 2023-03-30 DIAGNOSIS — Z992 Dependence on renal dialysis: Secondary | ICD-10-CM | POA: Diagnosis not present

## 2023-04-02 DIAGNOSIS — N186 End stage renal disease: Secondary | ICD-10-CM | POA: Diagnosis not present

## 2023-04-02 DIAGNOSIS — N2581 Secondary hyperparathyroidism of renal origin: Secondary | ICD-10-CM | POA: Diagnosis not present

## 2023-04-02 DIAGNOSIS — Z992 Dependence on renal dialysis: Secondary | ICD-10-CM | POA: Diagnosis not present

## 2023-04-02 DIAGNOSIS — D509 Iron deficiency anemia, unspecified: Secondary | ICD-10-CM | POA: Diagnosis not present

## 2023-04-06 DIAGNOSIS — N186 End stage renal disease: Secondary | ICD-10-CM | POA: Diagnosis not present

## 2023-04-06 DIAGNOSIS — Z992 Dependence on renal dialysis: Secondary | ICD-10-CM | POA: Diagnosis not present

## 2023-04-06 DIAGNOSIS — N2581 Secondary hyperparathyroidism of renal origin: Secondary | ICD-10-CM | POA: Diagnosis not present

## 2023-04-06 DIAGNOSIS — D509 Iron deficiency anemia, unspecified: Secondary | ICD-10-CM | POA: Diagnosis not present

## 2023-04-09 DIAGNOSIS — N2581 Secondary hyperparathyroidism of renal origin: Secondary | ICD-10-CM | POA: Diagnosis not present

## 2023-04-09 DIAGNOSIS — Z992 Dependence on renal dialysis: Secondary | ICD-10-CM | POA: Diagnosis not present

## 2023-04-09 DIAGNOSIS — D509 Iron deficiency anemia, unspecified: Secondary | ICD-10-CM | POA: Diagnosis not present

## 2023-04-09 DIAGNOSIS — N186 End stage renal disease: Secondary | ICD-10-CM | POA: Diagnosis not present

## 2023-04-10 ENCOUNTER — Ambulatory Visit (INDEPENDENT_AMBULATORY_CARE_PROVIDER_SITE_OTHER): Payer: Medicare Other | Admitting: Podiatry

## 2023-04-10 DIAGNOSIS — E1169 Type 2 diabetes mellitus with other specified complication: Secondary | ICD-10-CM

## 2023-04-10 DIAGNOSIS — I96 Gangrene, not elsewhere classified: Secondary | ICD-10-CM | POA: Diagnosis not present

## 2023-04-10 NOTE — Progress Notes (Signed)
Subjective:  Patient ID: Alexis Tucker, female    DOB: 05-12-1941,  MRN: [id number]  No chief complaint on file.   82 y.o. female presents with the above complaint.  Patient presents with bilateral hallux distal tip gangrene.  Patient has severe vascular disease.  Patient is being treated by Dr. dew  who is done multiple angiogram to both lower extremity.  She denies any acute plain to it.  They have been keeping it covered with bacitracin.  Denies any other acute complaints.   Review of Systems: Negative except as noted in the HPI. Denies N/V/F/Ch.  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   a.)  Avastin injections as [facility]   DJD (degenerative joint disease)    ESRD (end stage renal disease) (HCC)    GERD (gastroesophageal reflux disease)    Glaucoma 03/2014   History of colon cancer 2006   a.) s/p colectomy   History of hepatitis A 1990s   from food, resolved   HTN (hypertension)    Hyperplastic colon polyp    Long term current use of anticoagulant    a.) apixaban   Long term current use of antithrombotics/antiplatelets    a.) pentoxifylline   Macular degeneration    wet R with vision loss, dry L; to see retina specialist   Neuropathy    OA (osteoarthritis)    Osteonecrosis (HCC)    hips?   PONV (postoperative nausea and vomiting)    Post-surgical hypothyroidism    Thrombocytopenia (HCC)    Tubular adenoma    Wears dentures    partial upper    Current Outpatient Medications:    allopurinol (ZYLOPRIM) 100 MG tablet, Take 1 tablet (100 mg total) by mouth daily. For gout, Disp: 90 tablet, Rfl: 4   amLODipine (NORVASC) 10 MG tablet, Take 2.5 mg by mouth daily., Disp: , Rfl:    anastrozole (ARIMIDEX) 1 MG tablet, TAKE 1 TABLET BY MOUTH EVERY DAY, Disp: 90 tablet, Rfl: 1   apixaban (ELIQUIS) 2.5 MG TABS tablet, Take 1 tablet (2.5 mg total) by mouth 2 (two) times daily. As blood thinner, Disp: 60 tablet, Rfl: 3   aspirin EC  81 MG tablet, Take 1 tablet (81 mg total) by mouth daily. Swallow whole., Disp: 150 tablet, Rfl: 2   atorvastatin (LIPITOR) 10 MG tablet, Take 1 tablet (10 mg total) by mouth daily., Disp: 30 tablet, Rfl: 11   carvedilol (COREG) 25 MG tablet, Take 0.5 tablets (12.5 mg total) by mouth 2 (two) times daily with a meal., Disp: 30 tablet, Rfl: 2   Cholecalciferol (VITAMIN D-3) 5000 UNITS TABS, Take 1 tablet by mouth daily., Disp: , Rfl:    cloNIDine HCl (KAPVAY) 0.1 MG TB12 ER tablet, Take 0.1 mg by mouth 2 (two) times daily. PRN, Disp: , Rfl:    isosorbide mononitrate (IMDUR) 30 MG 24 hr tablet, Take 1 tablet (30 mg total) by mouth daily., Disp:  90 tablet, Rfl: 3   levothyroxine (SYNTHROID) 88 MCG tablet, Take 1 tablet (88 mcg total) by mouth daily. For hypothyroidism, Disp: 90 tablet, Rfl: 3   linagliptin (TRADJENTA) 5 MG TABS tablet, Take 1 tablet (5 mg total) by mouth daily., Disp: 90 tablet, Rfl: 3   pantoprazole (PROTONIX) 40 MG tablet, Take 1 tablet (40 mg total) by mouth daily as needed. For reflux, Disp: 90 tablet, Rfl: 4   sertraline (ZOLOFT) 25 MG tablet, Take 1 tablet (25 mg total) by mouth daily., Disp: 90 tablet, Rfl: 3  Social History   Tobacco Use  Smoking Status Former   Passive exposure: Past  Smokeless Tobacco Never  Tobacco Comments   quit over 40 yrs ago    Allergies  Allergen Reactions   Tramadol Other (See Comments)    nightmares   Codeine Anxiety   Sulfa Antibiotics Itching and Rash   Objective:  There were no vitals filed for this visit. There is no height or weight on file to calculate BMI. Constitutional Well developed. Well nourished.  Vascular Dorsalis pedis pulses palpable bilaterally. Posterior tibial pulses palpable bilaterally. Capillary refill normal to all digits.  No cyanosis or clubbing noted. Pedal hair growth normal.  Neurologic Normal speech. Oriented to person, place, and time. Epicritic sensation to light touch grossly present bilaterally.  Dermatologic Nails well groomed and normal in appearance. No open wounds. No skin lesions.  Orthopedic: Bilateral hallux gangrene distal tip.  Dry stable eschar noted to the distal phalanx.  No purulent drainage noted no erythema noted no active infection noted   Radiographs: None Assessment:   1. Gangrene of toe of left foot (HCC)   2. Gangrene of toe of right foot (HCC)   3. Type 2 diabetes mellitus with other specified complication, without long-term current use of insulin (HCC)    Plan:  Patient was evaluated and treated and all questions answered.  Bilateral hallux dry gangrene actively demarcating -All questions and  concerns were discussed with the patient in extensive detail -At this time there is no clinical signs of infection dry stable gangrene noted encouraged Betadine wet-to-dry dressing bilaterally daily changes. -Patient is primarily being treated by vascular for further intervention she is scheduled for future angiogram in next few weeks. -At this time if it worsens or becomes actively infected patient will need bilateral hallux versus partial first ray amputation.  Patient is a high risk of undergoing major amputation due to her circulation/peripheral vascular disease. -I discussed all of this with the patient she states understanding -Bilateral surgical shoe was dispensed  No follow-ups on file.

## 2023-04-11 DIAGNOSIS — Z992 Dependence on renal dialysis: Secondary | ICD-10-CM | POA: Diagnosis not present

## 2023-04-11 DIAGNOSIS — N186 End stage renal disease: Secondary | ICD-10-CM | POA: Diagnosis not present

## 2023-04-11 DIAGNOSIS — D509 Iron deficiency anemia, unspecified: Secondary | ICD-10-CM | POA: Diagnosis not present

## 2023-04-11 DIAGNOSIS — N2581 Secondary hyperparathyroidism of renal origin: Secondary | ICD-10-CM | POA: Diagnosis not present

## 2023-04-13 DIAGNOSIS — N2581 Secondary hyperparathyroidism of renal origin: Secondary | ICD-10-CM | POA: Diagnosis not present

## 2023-04-13 DIAGNOSIS — N186 End stage renal disease: Secondary | ICD-10-CM | POA: Diagnosis not present

## 2023-04-13 DIAGNOSIS — D509 Iron deficiency anemia, unspecified: Secondary | ICD-10-CM | POA: Diagnosis not present

## 2023-04-13 DIAGNOSIS — Z992 Dependence on renal dialysis: Secondary | ICD-10-CM | POA: Diagnosis not present

## 2023-04-16 ENCOUNTER — Emergency Department: Payer: Medicare Other

## 2023-04-16 ENCOUNTER — Inpatient Hospital Stay
Admission: EM | Admit: 2023-04-16 | Discharge: 2023-04-20 | DRG: 314 | Disposition: A | Discharge status: Hospice | Payer: Medicare Other | Attending: Internal Medicine | Admitting: Internal Medicine

## 2023-04-16 ENCOUNTER — Encounter: Payer: Self-pay | Admitting: Internal Medicine

## 2023-04-16 DIAGNOSIS — Z95820 Peripheral vascular angioplasty status with implants and grafts: Secondary | ICD-10-CM | POA: Diagnosis not present

## 2023-04-16 DIAGNOSIS — N186 End stage renal disease: Secondary | ICD-10-CM | POA: Diagnosis not present

## 2023-04-16 DIAGNOSIS — D631 Anemia in chronic kidney disease: Secondary | ICD-10-CM | POA: Diagnosis not present

## 2023-04-16 DIAGNOSIS — F0390 Unspecified dementia without behavioral disturbance: Secondary | ICD-10-CM | POA: Diagnosis present

## 2023-04-16 DIAGNOSIS — I4891 Unspecified atrial fibrillation: Secondary | ICD-10-CM | POA: Diagnosis not present

## 2023-04-16 DIAGNOSIS — E1169 Type 2 diabetes mellitus with other specified complication: Secondary | ICD-10-CM | POA: Diagnosis present

## 2023-04-16 DIAGNOSIS — Z6825 Body mass index (BMI) 25.0-25.9, adult: Secondary | ICD-10-CM

## 2023-04-16 DIAGNOSIS — E11311 Type 2 diabetes mellitus with unspecified diabetic retinopathy with macular edema: Secondary | ICD-10-CM | POA: Diagnosis not present

## 2023-04-16 DIAGNOSIS — N390 Urinary tract infection, site not specified: Secondary | ICD-10-CM | POA: Diagnosis present

## 2023-04-16 DIAGNOSIS — Z515 Encounter for palliative care: Secondary | ICD-10-CM

## 2023-04-16 DIAGNOSIS — Z992 Dependence on renal dialysis: Secondary | ICD-10-CM

## 2023-04-16 DIAGNOSIS — Z1152 Encounter for screening for COVID-19: Secondary | ICD-10-CM | POA: Diagnosis not present

## 2023-04-16 DIAGNOSIS — T827XXA Infection and inflammatory reaction due to other cardiac and vascular devices, implants and grafts, initial encounter: Secondary | ICD-10-CM | POA: Diagnosis not present

## 2023-04-16 DIAGNOSIS — Y838 Other surgical procedures as the cause of abnormal reaction of the patient, or of later complication, without mention of misadventure at the time of the procedure: Secondary | ICD-10-CM | POA: Diagnosis present

## 2023-04-16 DIAGNOSIS — Z79811 Long term (current) use of aromatase inhibitors: Secondary | ICD-10-CM

## 2023-04-16 DIAGNOSIS — E1122 Type 2 diabetes mellitus with diabetic chronic kidney disease: Secondary | ICD-10-CM | POA: Diagnosis not present

## 2023-04-16 DIAGNOSIS — K219 Gastro-esophageal reflux disease without esophagitis: Secondary | ICD-10-CM | POA: Diagnosis present

## 2023-04-16 DIAGNOSIS — I350 Nonrheumatic aortic (valve) stenosis: Secondary | ICD-10-CM | POA: Diagnosis not present

## 2023-04-16 DIAGNOSIS — Z7901 Long term (current) use of anticoagulants: Secondary | ICD-10-CM

## 2023-04-16 DIAGNOSIS — Z7189 Other specified counseling: Secondary | ICD-10-CM | POA: Diagnosis not present

## 2023-04-16 DIAGNOSIS — R000 Tachycardia, unspecified: Secondary | ICD-10-CM | POA: Diagnosis not present

## 2023-04-16 DIAGNOSIS — B957 Other staphylococcus as the cause of diseases classified elsewhere: Secondary | ICD-10-CM | POA: Diagnosis present

## 2023-04-16 DIAGNOSIS — Z9889 Other specified postprocedural states: Secondary | ICD-10-CM | POA: Diagnosis not present

## 2023-04-16 DIAGNOSIS — I96 Gangrene, not elsewhere classified: Secondary | ICD-10-CM | POA: Diagnosis not present

## 2023-04-16 DIAGNOSIS — N2581 Secondary hyperparathyroidism of renal origin: Secondary | ICD-10-CM | POA: Diagnosis present

## 2023-04-16 DIAGNOSIS — R29898 Other symptoms and signs involving the musculoskeletal system: Secondary | ICD-10-CM | POA: Diagnosis not present

## 2023-04-16 DIAGNOSIS — Z7401 Bed confinement status: Secondary | ICD-10-CM | POA: Diagnosis not present

## 2023-04-16 DIAGNOSIS — Z79899 Other long term (current) drug therapy: Secondary | ICD-10-CM

## 2023-04-16 DIAGNOSIS — E1140 Type 2 diabetes mellitus with diabetic neuropathy, unspecified: Secondary | ICD-10-CM | POA: Diagnosis not present

## 2023-04-16 DIAGNOSIS — E43 Unspecified severe protein-calorie malnutrition: Secondary | ICD-10-CM | POA: Diagnosis present

## 2023-04-16 DIAGNOSIS — Z66 Do not resuscitate: Secondary | ICD-10-CM | POA: Diagnosis not present

## 2023-04-16 DIAGNOSIS — R509 Fever, unspecified: Secondary | ICD-10-CM | POA: Diagnosis not present

## 2023-04-16 DIAGNOSIS — E890 Postprocedural hypothyroidism: Secondary | ICD-10-CM | POA: Diagnosis present

## 2023-04-16 DIAGNOSIS — I482 Chronic atrial fibrillation, unspecified: Secondary | ICD-10-CM | POA: Diagnosis present

## 2023-04-16 DIAGNOSIS — E785 Hyperlipidemia, unspecified: Secondary | ICD-10-CM | POA: Diagnosis present

## 2023-04-16 DIAGNOSIS — R6521 Severe sepsis with septic shock: Secondary | ICD-10-CM | POA: Diagnosis not present

## 2023-04-16 DIAGNOSIS — R0902 Hypoxemia: Secondary | ICD-10-CM | POA: Diagnosis not present

## 2023-04-16 DIAGNOSIS — Z8261 Family history of arthritis: Secondary | ICD-10-CM

## 2023-04-16 DIAGNOSIS — Z823 Family history of stroke: Secondary | ICD-10-CM

## 2023-04-16 DIAGNOSIS — I5032 Chronic diastolic (congestive) heart failure: Secondary | ICD-10-CM | POA: Diagnosis present

## 2023-04-16 DIAGNOSIS — Z853 Personal history of malignant neoplasm of breast: Secondary | ICD-10-CM

## 2023-04-16 DIAGNOSIS — I739 Peripheral vascular disease, unspecified: Secondary | ICD-10-CM | POA: Diagnosis present

## 2023-04-16 DIAGNOSIS — T8249XA Other complication of vascular dialysis catheter, initial encounter: Secondary | ICD-10-CM | POA: Diagnosis not present

## 2023-04-16 DIAGNOSIS — Z7984 Long term (current) use of oral hypoglycemic drugs: Secondary | ICD-10-CM

## 2023-04-16 DIAGNOSIS — A419 Sepsis, unspecified organism: Secondary | ICD-10-CM | POA: Diagnosis not present

## 2023-04-16 DIAGNOSIS — I499 Cardiac arrhythmia, unspecified: Secondary | ICD-10-CM | POA: Diagnosis not present

## 2023-04-16 DIAGNOSIS — R4182 Altered mental status, unspecified: Secondary | ICD-10-CM | POA: Diagnosis present

## 2023-04-16 DIAGNOSIS — I132 Hypertensive heart and chronic kidney disease with heart failure and with stage 5 chronic kidney disease, or end stage renal disease: Secondary | ICD-10-CM | POA: Diagnosis present

## 2023-04-16 DIAGNOSIS — Z833 Family history of diabetes mellitus: Secondary | ICD-10-CM

## 2023-04-16 DIAGNOSIS — I10 Essential (primary) hypertension: Secondary | ICD-10-CM | POA: Diagnosis present

## 2023-04-16 DIAGNOSIS — I2510 Atherosclerotic heart disease of native coronary artery without angina pectoris: Secondary | ICD-10-CM | POA: Diagnosis present

## 2023-04-16 DIAGNOSIS — A411 Sepsis due to other specified staphylococcus: Principal | ICD-10-CM | POA: Diagnosis present

## 2023-04-16 DIAGNOSIS — Z7982 Long term (current) use of aspirin: Secondary | ICD-10-CM

## 2023-04-16 DIAGNOSIS — Z7902 Long term (current) use of antithrombotics/antiplatelets: Secondary | ICD-10-CM

## 2023-04-16 DIAGNOSIS — Z993 Dependence on wheelchair: Secondary | ICD-10-CM

## 2023-04-16 DIAGNOSIS — E1152 Type 2 diabetes mellitus with diabetic peripheral angiopathy with gangrene: Secondary | ICD-10-CM | POA: Diagnosis not present

## 2023-04-16 DIAGNOSIS — Z452 Encounter for adjustment and management of vascular access device: Secondary | ICD-10-CM | POA: Diagnosis not present

## 2023-04-16 DIAGNOSIS — I480 Paroxysmal atrial fibrillation: Secondary | ICD-10-CM | POA: Diagnosis present

## 2023-04-16 DIAGNOSIS — R531 Weakness: Secondary | ICD-10-CM | POA: Diagnosis not present

## 2023-04-16 DIAGNOSIS — Z811 Family history of alcohol abuse and dependence: Secondary | ICD-10-CM

## 2023-04-16 DIAGNOSIS — Z8673 Personal history of transient ischemic attack (TIA), and cerebral infarction without residual deficits: Secondary | ICD-10-CM

## 2023-04-16 DIAGNOSIS — Z789 Other specified health status: Secondary | ICD-10-CM

## 2023-04-16 DIAGNOSIS — Z170 Estrogen receptor positive status [ER+]: Secondary | ICD-10-CM

## 2023-04-16 DIAGNOSIS — Z1721 Progesterone receptor positive status: Secondary | ICD-10-CM

## 2023-04-16 DIAGNOSIS — Z860101 Personal history of adenomatous and serrated colon polyps: Secondary | ICD-10-CM

## 2023-04-16 DIAGNOSIS — Z882 Allergy status to sulfonamides status: Secondary | ICD-10-CM

## 2023-04-16 DIAGNOSIS — Z87412 Personal history of vulvar dysplasia: Secondary | ICD-10-CM

## 2023-04-16 DIAGNOSIS — Z90710 Acquired absence of both cervix and uterus: Secondary | ICD-10-CM

## 2023-04-16 DIAGNOSIS — Z8249 Family history of ischemic heart disease and other diseases of the circulatory system: Secondary | ICD-10-CM

## 2023-04-16 DIAGNOSIS — Z9049 Acquired absence of other specified parts of digestive tract: Secondary | ICD-10-CM

## 2023-04-16 DIAGNOSIS — Z85038 Personal history of other malignant neoplasm of large intestine: Secondary | ICD-10-CM

## 2023-04-16 DIAGNOSIS — I959 Hypotension, unspecified: Secondary | ICD-10-CM | POA: Diagnosis not present

## 2023-04-16 DIAGNOSIS — Z885 Allergy status to narcotic agent status: Secondary | ICD-10-CM

## 2023-04-16 DIAGNOSIS — Z96651 Presence of right artificial knee joint: Secondary | ICD-10-CM | POA: Diagnosis present

## 2023-04-16 DIAGNOSIS — D638 Anemia in other chronic diseases classified elsewhere: Secondary | ICD-10-CM | POA: Diagnosis present

## 2023-04-16 DIAGNOSIS — Z87891 Personal history of nicotine dependence: Secondary | ICD-10-CM

## 2023-04-16 DIAGNOSIS — H547 Unspecified visual loss: Secondary | ICD-10-CM | POA: Diagnosis present

## 2023-04-16 DIAGNOSIS — R6889 Other general symptoms and signs: Secondary | ICD-10-CM | POA: Diagnosis not present

## 2023-04-16 DIAGNOSIS — R7881 Bacteremia: Secondary | ICD-10-CM | POA: Diagnosis present

## 2023-04-16 DIAGNOSIS — Z79890 Hormone replacement therapy (postmenopausal): Secondary | ICD-10-CM

## 2023-04-16 DIAGNOSIS — M1990 Unspecified osteoarthritis, unspecified site: Secondary | ICD-10-CM | POA: Diagnosis present

## 2023-04-16 LAB — COMPREHENSIVE METABOLIC PANEL
ALT: 7 U/L (ref 0–44)
AST: 16 U/L (ref 15–41)
Albumin: 2.7 g/dL — ABNORMAL LOW (ref 3.5–5.0)
Alkaline Phosphatase: 68 U/L (ref 38–126)
Anion gap: 12 (ref 5–15)
BUN: 26 mg/dL — ABNORMAL HIGH (ref 8–23)
CO2: 27 mmol/L (ref 22–32)
Calcium: 8.9 mg/dL (ref 8.9–10.3)
Chloride: 97 mmol/L — ABNORMAL LOW (ref 98–111)
Creatinine, Ser: 4.98 mg/dL — ABNORMAL HIGH (ref 0.44–1.00)
GFR, Estimated: 8 mL/min — ABNORMAL LOW (ref >60)
Glucose, Bld: 128 mg/dL — ABNORMAL HIGH (ref 70–99)
Potassium: 3.5 mmol/L (ref 3.5–5.1)
Sodium: 136 mmol/L (ref 135–145)
Total Bilirubin: 0.8 mg/dL (ref 0.3–1.2)
Total Protein: 7 g/dL (ref 6.5–8.1)

## 2023-04-16 LAB — CBC
HCT: 32.6 % — ABNORMAL LOW (ref 36.0–46.0)
Hemoglobin: 10.2 g/dL — ABNORMAL LOW (ref 12.0–15.0)
MCH: 29 pg (ref 26.0–34.0)
MCHC: 31.3 g/dL (ref 30.0–36.0)
MCV: 92.6 fL (ref 80.0–100.0)
Platelets: 129 10*3/uL — ABNORMAL LOW (ref 150–400)
RBC: 3.52 MIL/uL — ABNORMAL LOW (ref 3.87–5.11)
RDW: 14.9 % (ref 11.5–15.5)
WBC: 7.6 10*3/uL (ref 4.0–10.5)
nRBC: 0 % (ref 0.0–0.2)

## 2023-04-16 LAB — LACTIC ACID, PLASMA
Lactic Acid, Venous: 1.1 mmol/L (ref 0.5–1.9)
Lactic Acid, Venous: 1.5 mmol/L (ref 0.5–1.9)

## 2023-04-16 LAB — RESP PANEL BY RT-PCR (RSV, FLU A&B, COVID)  RVPGX2
Influenza A by PCR: NEGATIVE
Influenza B by PCR: NEGATIVE
Resp Syncytial Virus by PCR: NEGATIVE
SARS Coronavirus 2 by RT PCR: NEGATIVE

## 2023-04-16 LAB — PROCALCITONIN: Procalcitonin: 3.25 ng/mL

## 2023-04-16 MED ORDER — HEPARIN SODIUM (PORCINE) 5000 UNIT/ML IJ SOLN: 5000.0000 [IU] | Freq: Three times a day (TID) | INTRAMUSCULAR | Status: DC

## 2023-04-16 MED ORDER — ENSURE MAX PROTEIN PO LIQD
11.0000 [oz_av] | Freq: Two times a day (BID) | ORAL | Status: DC
  Administered 2023-04-16 – 2023-04-17 (×2): 11 [oz_av] via ORAL
  Filled 2023-04-16: qty 330

## 2023-04-16 MED ORDER — BISACODYL 10 MG RE SUPP: 10.0000 mg | Freq: Every day | RECTAL | Status: DC | PRN

## 2023-04-16 MED ORDER — ANASTROZOLE 1 MG PO TABS
1.0000 mg | ORAL_TABLET | Freq: Every day | ORAL | Status: DC
  Administered 2023-04-17 – 2023-04-20 (×2): 1 mg via ORAL
  Filled 2023-04-16 (×5): qty 1

## 2023-04-16 MED ORDER — CLONIDINE HCL ER 0.1 MG PO TB12: 0.1000 mg | ORAL_TABLET | Freq: Two times a day (BID) | ORAL | Status: DC | PRN

## 2023-04-16 MED ORDER — SENNOSIDES-DOCUSATE SODIUM 8.6-50 MG PO TABS: 1.0000 | ORAL_TABLET | Freq: Every evening | ORAL | Status: DC | PRN

## 2023-04-16 MED ORDER — SODIUM CHLORIDE 0.9 % IV SOLN
500.0000 mg | INTRAVENOUS | Status: DC
  Administered 2023-04-16: 500 mg via INTRAVENOUS
  Filled 2023-04-16 (×2): qty 5

## 2023-04-16 MED ORDER — ONDANSETRON HCL 4 MG/2ML IJ SOLN: 4.0000 mg | Freq: Four times a day (QID) | INTRAMUSCULAR | Status: DC | PRN

## 2023-04-16 MED ORDER — ATORVASTATIN CALCIUM 10 MG PO TABS
10.0000 mg | ORAL_TABLET | Freq: Every day | ORAL | Status: DC
  Administered 2023-04-16 – 2023-04-17 (×2): 10 mg via ORAL
  Filled 2023-04-16 (×3): qty 1

## 2023-04-16 MED ORDER — ONDANSETRON HCL 4 MG PO TABS: 4.0000 mg | ORAL_TABLET | Freq: Four times a day (QID) | ORAL | Status: DC | PRN

## 2023-04-16 MED ORDER — PANTOPRAZOLE SODIUM 40 MG PO TBEC: 40.0000 mg | DELAYED_RELEASE_TABLET | Freq: Every day | ORAL | Status: DC | PRN

## 2023-04-16 MED ORDER — ACETAMINOPHEN 650 MG RE SUPP
650.0000 mg | Freq: Four times a day (QID) | RECTAL | Status: DC | PRN
  Administered 2023-04-17: 650 mg via RECTAL
  Filled 2023-04-16: qty 1

## 2023-04-16 MED ORDER — VITAMIN D 25 MCG (1000 UNIT) PO TABS
25.0000 ug | ORAL_TABLET | Freq: Every day | ORAL | Status: DC
  Administered 2023-04-17: 25 ug via ORAL
  Filled 2023-04-16 (×2): qty 1

## 2023-04-16 MED ORDER — ACETAMINOPHEN 325 MG PO TABS: 650.0000 mg | ORAL_TABLET | Freq: Four times a day (QID) | ORAL | Status: DC | PRN

## 2023-04-16 MED ORDER — ASPIRIN 81 MG PO TBEC
81.0000 mg | DELAYED_RELEASE_TABLET | Freq: Every day | ORAL | Status: DC
  Administered 2023-04-16 – 2023-04-17 (×2): 81 mg via ORAL
  Filled 2023-04-16 (×3): qty 1

## 2023-04-16 MED ORDER — ENSURE MAX PROTEIN PO LIQD: 11.0000 [oz_av] | Freq: Two times a day (BID) | ORAL | Status: DC | PRN

## 2023-04-16 MED ORDER — SODIUM CHLORIDE 0.9 % IV SOLN
2.0000 g | Freq: Once | INTRAVENOUS | Status: AC
  Administered 2023-04-16: 2 g via INTRAVENOUS
  Filled 2023-04-16: qty 20

## 2023-04-16 MED ORDER — SERTRALINE HCL 50 MG PO TABS
25.0000 mg | ORAL_TABLET | Freq: Every day | ORAL | Status: DC
  Administered 2023-04-17 – 2023-04-20 (×2): 25 mg via ORAL
  Filled 2023-04-16 (×4): qty 1

## 2023-04-16 MED ORDER — LEVOTHYROXINE SODIUM 88 MCG PO TABS
88.0000 ug | ORAL_TABLET | Freq: Every day | ORAL | Status: DC
  Administered 2023-04-17 – 2023-04-19 (×3): 88 ug via ORAL
  Filled 2023-04-16 (×3): qty 1

## 2023-04-16 MED ORDER — CHLORHEXIDINE GLUCONATE CLOTH 2 % EX PADS
6.0000 | MEDICATED_PAD | Freq: Every day | CUTANEOUS | Status: DC
  Administered 2023-04-17 – 2023-04-18 (×2): 6 via TOPICAL

## 2023-04-16 MED ORDER — ALLOPURINOL 100 MG PO TABS
100.0000 mg | ORAL_TABLET | Freq: Every day | ORAL | Status: DC
  Administered 2023-04-16 – 2023-04-20 (×3): 100 mg via ORAL
  Filled 2023-04-16 (×5): qty 1

## 2023-04-16 MED ORDER — SODIUM CHLORIDE 0.9 % IV SOLN
2.0000 g | INTRAVENOUS | Status: DC
  Administered 2023-04-17 – 2023-04-18 (×2): 2 g via INTRAVENOUS
  Filled 2023-04-16 (×2): qty 20

## 2023-04-16 MED ORDER — ISOSORBIDE MONONITRATE ER 30 MG PO TB24
30.0000 mg | ORAL_TABLET | Freq: Every day | ORAL | Status: DC
  Administered 2023-04-17: 30 mg via ORAL
  Filled 2023-04-16: qty 1

## 2023-04-16 MED ORDER — APIXABAN 2.5 MG PO TABS
2.5000 mg | ORAL_TABLET | Freq: Two times a day (BID) | ORAL | Status: DC
  Administered 2023-04-16 – 2023-04-20 (×7): 2.5 mg via ORAL
  Filled 2023-04-16 (×7): qty 1

## 2023-04-16 MED ORDER — CARVEDILOL 12.5 MG PO TABS
12.5000 mg | ORAL_TABLET | Freq: Two times a day (BID) | ORAL | Status: DC
  Administered 2023-04-17: 12.5 mg via ORAL
  Filled 2023-04-16: qty 2

## 2023-04-16 NOTE — Assessment & Plan Note (Addendum)
Nephrology has been consulted, strict I's and O's Per nephrology, plan for hemodialysis tomorrow

## 2023-04-16 NOTE — Hospital Course (Signed)
[name] is a 82 year old female with history of hypertension, hypothyroid, end-stage renal disease on hemodialysis MWF, hypothyroid, status post right AV graft placement by vascular, paroxysmal atrial fibrillation, left-sided breast cancer ER positive, anemia of chronic disease, who presents to the emergency department for chief concerns of weakness and lethargy and fever.  Vitals in the ED showed temperature of 98, respiration rate of 18, heart rate of 109, blood pressure 118/78, SpO2 of 96% on room air.  Serum sodium is 136, potassium 3.5, chloride 97, bicarb 27, BUN of 26, serum creatinine 4.98, EGFR of 8, nonfasting blood glucose 128, WBC 7.6, hemoglobin 10.2, platelets of 129.  COVID/influenza A/influenza B/RSV PCR were negative.  Blood cultures x 2, lactic acid x 2, UA have been ordered by EDP, pending collection at the time of this dictation.  ED treatment: Ceftriaxone 2 g IV one-time dose.

## 2023-04-16 NOTE — Assessment & Plan Note (Signed)
Home levothyroxine 88 mcg daily before breakfast resumed

## 2023-04-16 NOTE — Assessment & Plan Note (Signed)
Home anastrozole 1 mg daily resumed

## 2023-04-16 NOTE — ED Provider Notes (Addendum)
[facility] Provider Note    Event Date/Time   First MD Initiated Contact with Patient 04/16/23 1118     (approximate)  History   Chief Complaint: Weakness and Fatigue  HPI  Alexis Tucker is a 82 y.o. female with a past medical history of anemia, atrial fibrillation, prior CVA, dementia, diabetes, ESRD on HD Monday/Wednesday/Friday, hypertension, presents to the emergency department for generalized weakness.  According to the daughter she noted on Saturday and Sunday that the patient was feeling more weak than normal.  She states at baseline the patient is able to ambulate with use of a walker short distances but otherwise uses a wheelchair at baseline.  Yesterday and today the patient was too weak to use a walker and today the patient could barely sit up in bed due to weakness.  Daughter states patient had a low-grade temperature at home 100.5 and she gave her 2 Tylenol prior to EMS arrival.  Denies any symptoms such as cough congestion vomiting or diarrhea.  Does create a small amount of urine each day per daughter but denies any dysuria.  Here the patient is calm cooperative pleasant she has no complaints.  Physical Exam   Triage Vital Signs: ED Triage Vitals  Encounter Vitals Group     BP 04/16/23 1110 118/78     Systolic BP Percentile --      Diastolic BP Percentile --      Pulse Rate 04/16/23 1110 (!) 109     Resp 04/16/23 1110 18     Temp 04/16/23 1110 98 F (36.7 C)     Temp Source 04/16/23 1110 Oral     SpO2 04/16/23 1115 96 %     Weight --      Height --      Head Circumference --      Peak Flow --      Pain Score --      Pain Loc --      Pain Education --      Exclude from Growth Chart --     Most recent vital signs: Vitals:   04/16/23 1110 04/16/23 1115  BP: 118/78   Pulse: (!) 109   Resp: 18   Temp: 98 F (36.7 C)   SpO2:  96%    General: Awake, no distress.  CV:  Good peripheral perfusion.  Regular rate and rhythm   Resp:  Normal effort.  Equal breath sounds bilaterally.  Abd:  No distention.  Soft, nontender.  No rebound or guarding. Other:  Older appearing right upper extremity fistula, right subclavian line in place.   ED Results / Procedures / Treatments   EKG  EKG viewed and interpreted by myself shows a sinus rhythm at 91 bpm with a narrow QRS, normal axis, normal intervals, no concerning ST changes.  RADIOLOGY  I have reviewed and interpreted the x-ray images.  No obvious consolidation on my evaluation. Radiology is read the x-ray is negative.   MEDICATIONS ORDERED IN ED: Medications - No data to display   IMPRESSION / MDM / ASSESSMENT AND PLAN / ED COURSE  I reviewed the triage vital signs and the nursing notes.  Patient's presentation is most consistent with acute presentation with potential threat to life or bodily function.  Patient presents to the emergency department for generalized weakness.  Received a full dialysis session on Friday has not yet received dialysis today.  Daughter states low-grade temperature 100.5 this morning.  Overall patient appears well,  no complaints, reassuring vital signs.  We will check labs including chemistry to help rule out electrolyte abnormality such as hyperkalemia, we will obtain a urine sample as well as a COVID swab to rule out infectious etiology.  As the patient is due for dialysis today we will also obtain a chest x-ray to rule out infectious etiology but also to evaluate for CHF/fluid accumulation.  Patient and daughter agreeable to plan of care and workup.  Patient's workup was overall reassuring reassuring CBC with a normal white blood cell count negative COVID/flu/RSV, reassuring chemistry and normal chest x-ray.  Attempted In-N-Out catheter but there is no urine to drain.  Daughter states patient may urinate once every day or every other day but is a very small amount.  Given the patient's right subclavian catheter and fever with weakness  suspicion for possible line infection.  We will send blood cultures and start the patient on IV Rocephin and admit to the hospitalist service for further workup and treatment.  I spoke to Dr. Thedore Mins of nephrology who is in agreement with this plan.  FINAL CLINICAL IMPRESSION(S) / ED DIAGNOSES   Weakness Fever of unknown source   Note:  This document was prepared using Dragon voice recognition software and may include unintentional dictation errors.   Minna Antis, MD 04/16/23 1520    Minna Antis, MD 04/16/23 1536

## 2023-04-16 NOTE — Assessment & Plan Note (Addendum)
Confirmed with daughter at bedside

## 2023-04-16 NOTE — ED Triage Notes (Addendum)
Pt from home via [facility], pts daughter called 911 d/t being unable to get pt in the car for her MWF hemodialysis treatment. Per daughter pt has been increasingly weak and lethargic. Pt states that she feels normal.

## 2023-04-16 NOTE — Assessment & Plan Note (Signed)
Daughter endorses increasing weakness and lethargy with Tmax of 100.5 at home.  Patient was given 2 Tylenol's. Etiology workup in progress, given patient has a temporary dialysis catheter on the right upper chest, bacteremia cannot be excluded at this time Check procalcitonin, blood cultures x 2 Addendum: Procalcitonin is elevated, added azithromycin 500 mg IV daily, 5 doses ordered Recheck procalcitonin in the a.m.

## 2023-04-16 NOTE — Assessment & Plan Note (Signed)
Home PPI resumed

## 2023-04-16 NOTE — Assessment & Plan Note (Signed)
Protein max boost between meals and at bedtime ordered Encourage patient to eat at bedside

## 2023-04-16 NOTE — H&P (Signed)
History and Physical   Alexis Tucker GEX:[id number] DOB: 05-19-1941 DOA: 04/16/2023  PCP: Eustaquio Boyden, MD Outpatient Specialists: Dr. Lamont Dowdy Patient coming from: Home via EMS  I have personally briefly reviewed patient's old medical records in [facility] EMR.  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  Vitals in the ED showed temperature of 98, respiration rate of 18, heart rate of 109, blood pressure 118/78, SpO2 of 96% on room air.  Serum sodium is 136, potassium 3.5, chloride 97, bicarb 27, BUN of 26, serum creatinine 4.98, EGFR of 8, nonfasting blood glucose 128, WBC 7.6, hemoglobin 10.2, platelets of 129.  COVID/influenza A/influenza B/RSV PCR were negative.  Blood cultures x 2, lactic acid x 2, UA have been ordered by EDP, pending collection at the time of this dictation.  ED treatment: Ceftriaxone 2 g IV one-time dose. ------------------------------- At bedside, patient was able to tell me her first and last name.  She was not able to tell me her age, the current calendar year, the current month or her current location.  Patient responses very consistent with history of dementia.  Patient appears weak and frail.  There is at bedside and states that patient missed dialysis on 04/09/2023 due to complaints of nausea.  However patient did not vomit.  Per daughter, at baseline patient can walk 10-15 steps with a walker otherwise she is wheelchair-bound.  However over the last week, patient has been increasingly weak and this worsened last night.  Social history: Patient lives at home with  daughter.  ROS: Unable to complete as patient has dementia  ED Course: Discussed with emergency medicine provider, patient requiring hospitalization for chief concerns of weakness with fever in a patient with temporary dialysis catheter.  Assessment/Plan  Principal Problem:   Altered mental status Active Problems:   ESRD (end stage renal disease) on dialysis (HCC)   Post-surgical hypothyroidism   HTN (hypertension)   Paroxysmal atrial fibrillation (HCC)   History of colon cancer   GERD (gastroesophageal reflux disease)   OA (osteoarthritis)   History of breast cancer   Diabetic retinopathy with macular edema (HCC)   History of arterial ischemic stroke   DNR (do not resuscitate)   Anemia of chronic disease   Protein-calorie malnutrition, severe (HCC)   Assessment and Plan:  * Altered mental status Daughter endorses increasing weakness and lethargy with Tmax of 100.5 at home.  Patient was given 2 Tylenol's. Etiology workup in progress, given patient has a temporary dialysis catheter on the right upper chest, bacteremia cannot be excluded at this time Check procalcitonin, blood cultures x 2 Addendum: Procalcitonin is elevated, added azithromycin 500 mg IV daily, 5 doses ordered Recheck procalcitonin in the a.m.  ESRD (end stage renal disease) on dialysis [facility]) Nephrology has been consulted, strict I's and O's Per nephrology, plan for hemodialysis tomorrow  Paroxysmal atrial fibrillation (HCC) Home Eliquis 2.5 mg p.o. twice daily resumed  HTN (hypertension) Home amlodipine not resumed on admission due to patient with low normotensive on admission Home Coreg 12.5 mg p.o. twice daily and Imdur 30 mg daily resumed for 04/17/2023 Home clonidine 0.1 mg p.o. twice daily as needed for SBP greater than 175 resumed on  admission  Post-surgical hypothyroidism Home levothyroxine 88 mcg daily before breakfast resumed  GERD (gastroesophageal reflux disease) Home PPI  resumed  Protein-calorie malnutrition, severe (HCC) Protein max boost between meals and at bedtime ordered Encourage patient to eat at bedside  Anemia of chronic disease At baseline  DNR (do not resuscitate) Confirmed with daughter at bedside  History of breast cancer Home anastrozole 1 mg daily resumed  Chart reviewed.   DVT prophylaxis: Heparin 5000 units subcutaneous every 8 hours Code Status: DNR/DNI Diet: Renal Family Communication: Updated daughter at bedside Disposition Plan: Pending clinical course Consults called: EDP consulted nephrology Admission status: Inpatient, telemetry medical  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   a.) moderately differentiated IMC (G2, T1N0Mx); s/p lumpectomy + adjuvant XRT   Breast cancer, 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   Coronary artery calcification seen on CT scan    CVA (cerebral vascular accident) (HCC)    a.) remotes infarct(s) noted on CT imaging from 08/17/2017 --> RIGHT parietotemporal infarct; lacunar infarct LEFT pons   Diabetes type 2, controlled (HCC) 2000s   Diabetic  retinopathy with macular edema (HCC) 03/2014   a.) Avastin injections as [facility]   DJD (degenerative joint disease)    ESRD (end stage renal disease) (HCC)    GERD (gastroesophageal reflux disease)    Glaucoma 03/2014   History of colon cancer 2006   a.) s/p colectomy   History of hepatitis A 1990s   from food, resolved   HTN (hypertension)    Hyperplastic colon polyp    Long term current use of anticoagulant    a.) apixaban   Long term current use of antithrombotics/antiplatelets    a.) pentoxifylline   Macular degeneration    wet R with vision loss, dry L; to see retina specialist   Neuropathy    OA (osteoarthritis)    Osteonecrosis (HCC)    hips?   PONV (postoperative nausea and vomiting)    Post-surgical hypothyroidism    Thrombocytopenia (HCC)    Tubular adenoma    Wears dentures    partial upper   Past Surgical History:  Procedure Laterality Date   A/V FISTULAGRAM Right 06/08/2022   Procedure: A/V Fistulagram;  Surgeon: Annice Needy, MD;  Location: [facility];  Service: Cardiovascular;  Laterality: Right;   ABDOMINAL HYSTERECTOMY  1976   partial, after tubal pregnancy, h/o fibroids with heavy bleeding   AV FISTULA PLACEMENT Right 02/01/2022   Procedure: INSERTION OF ARTERIOVENOUS (AV) GORE-TEX GRAFT ARM (LEFT UPPER EXTREMITY);  Surgeon: Annice Needy, MD;  Location: [facility] ORS;  Service: Vascular;  Laterality: Right;  BREAST BIOPSY Left 2008   positive   BREAST BIOPSY Left 11/22/2017   2oclock 2cmfn wing shaped clip, INVASIVE MAMMARY CARCINOMA   BREAST BIOPSY Left 11/22/2017   2oclock 4cmfn coil shaped clip, INVASIVE MAMMARY CARCINOMA   BREAST BIOPSY Left 11/22/2017   lymph node - butterfly hydroMARK   BREAST EXCISIONAL BIOPSY Left 01/07/2018   lumpectomy by Dr. Lemar Livings   BREAST LUMPECTOMY Left 2008   F/U radiation   BREAST LUMPECTOMY Left 2019   2 areas of [facility] at 2:00 position f/u with mammosite   BREAST LUMPECTOMY WITH SENTINEL LYMPH NODE  BIOPSY Left 01/07/2018   Procedure: BREAST LUMPECTOMY WITH SENTINEL LYMPH NODE BX;  Surgeon: Earline Mayotte, MD;  Location: [facility] ORS;  Service: General;  Laterality: Left;   BREAST MAMMOSITE  2008   carotid ultrasound  12/2012   1-39% bilateral stenosis, severe in ECA   CATARACT EXTRACTION W/PHACO Left 08/14/2016   Procedure: CATARACT EXTRACTION PHACO AND INTRAOCULAR LENS PLACEMENT (IOC)  Left Diabetic;  Surgeon: Sherald Hess, MD;  Location: [facility];  Service: Ophthalmology;  Laterality: Left;  Diabetic - oral meds   CHOLECYSTECTOMY  2005   COLECTOMY  2005   colon cancer   COLON SURGERY  2006   bowel obstruction   COLONOSCOPY  2008   ext hem, ileo-colonic anastomosis in cecum, tubular adenoma x1, rec rpt 1 yr for multiple polyps (in [place])   COLONOSCOPY WITH PROPOFOL N/A 10/19/2021   2.5cm TVA with low grade dysplasia, diverticulosis, no rpt recommended Wyline Mood, MD)   DIALYSIS/PERMA CATHETER INSERTION N/A 12/08/2022   Procedure: DIALYSIS/PERMA CATHETER INSERTION;  Surgeon: Renford Dills, MD;  Location: [facility];  Service: Cardiovascular;  Laterality: N/A;   ESOPHAGOGASTRODUODENOSCOPY N/A 05/06/2016   Procedure: ESOPHAGOGASTRODUODENOSCOPY (EGD);  Surgeon: Scot Jun, MD;  Location: [facility];  Service: Endoscopy;  Laterality: N/A;   EYE SURGERY     JOINT REPLACEMENT     LOWER EXTREMITY ANGIOGRAPHY Left 03/22/2023   Procedure: Lower Extremity Angiography;  Surgeon: Annice Needy, MD;  Location: [facility];  Service: Cardiovascular;  Laterality: Left;   LOWER EXTREMITY ANGIOGRAPHY Right 03/29/2023   Procedure: Lower Extremity Angiography;  Surgeon: Annice Needy, MD;  Location: [facility];  Service: Cardiovascular;  Laterality: Right;   THYROIDECTOMY, PARTIAL     unclear why   TOTAL KNEE ARTHROPLASTY Right 1999   UPPER EXTREMITY ANGIOGRAPHY Right 05/18/2022   Procedure: Upper Extremity Angiography;  Surgeon: Annice Needy, MD;  Location: [facility];  Service: Cardiovascular;  Laterality: Right;   US ECHOCARDIOGRAPHY  11/2007   nl LV fxn EF 60%, diastolic dysfunction, LVH, tr TR and MR, slcerotic aortic valve   VEIN LIGATION AND STRIPPING     Social History:  reports that she has quit smoking. She has been exposed to tobacco smoke. She has never used smokeless tobacco. She reports that she does not drink alcohol and does not use drugs.  Allergies  Allergen Reactions   Tramadol Other (See Comments)    nightmares   Codeine Anxiety   Sulfa Antibiotics Itching and Rash   Family History  Problem Relation Age of Onset   Diabetes Mother    Hypertension Mother    Arthritis Mother    Alcohol abuse Father    Arthritis Father    Stroke Daughter    CAD Neg Hx    Cancer Neg Hx    Breast cancer Neg Hx  Family history: Family history reviewed and not pertinent.  Prior to Admission medications   Medication Sig Start Date End Date Taking? Authorizing Provider  allopurinol (ZYLOPRIM) 100 MG tablet Take 1 tablet (100 mg total) by mouth daily. For gout 10/03/22   Eustaquio Boyden, MD  amLODipine (NORVASC) 10 MG tablet Take 2.5 mg by mouth daily.    Mosetta Pigeon, MD  anastrozole (ARIMIDEX) 1 MG tablet TAKE 1 TABLET BY MOUTH EVERY DAY 11/13/22   Earna Coder, MD  apixaban (ELIQUIS) 2.5 MG TABS tablet Take 1 tablet (2.5 mg total) by mouth 2 (two) times daily. As blood thinner 02/13/23   Eustaquio Boyden, MD  aspirin EC 81 MG tablet Take 1 tablet (81 mg total) by mouth daily. Swallow whole. 03/22/23 03/21/24  Annice Needy, MD  atorvastatin (LIPITOR) 10 MG tablet Take 1 tablet (10 mg total) by mouth daily. 03/22/23 03/21/24  Annice Needy, MD  carvedilol (COREG) 25 MG tablet Take 0.5 tablets (12.5 mg total) by mouth 2 (two) times daily with a meal. 11/01/22   Debbe Odea, MD  Cholecalciferol (VITAMIN D-3) 5000 UNITS TABS Take 1 tablet by mouth daily.    [provider]  cloNIDine HCl  (KAPVAY) 0.1 MG TB12 ER tablet Take 0.1 mg by mouth 2 (two) times daily. PRN    [provider]  isosorbide mononitrate (IMDUR) 30 MG 24 hr tablet Take 1 tablet (30 mg total) by mouth daily. 11/01/22 03/12/24  Debbe Odea, MD  levothyroxine (SYNTHROID) 88 MCG tablet Take 1 tablet (88 mcg total) by mouth daily. For hypothyroidism 02/13/23   Eustaquio Boyden, MD  linagliptin (TRADJENTA) 5 MG TABS tablet Take 1 tablet (5 mg total) by mouth daily. 02/13/23   Eustaquio Boyden, MD  pantoprazole (PROTONIX) 40 MG tablet Take 1 tablet (40 mg total) by mouth daily as needed. For reflux 10/03/22   Eustaquio Boyden, MD  sertraline (ZOLOFT) 25 MG tablet Take 1 tablet (25 mg total) by mouth daily. 02/13/23   Eustaquio Boyden, MD   Physical Exam: Vitals:   04/16/23 1110 04/16/23 1115 04/16/23 1550  BP: 118/78    Pulse: (!) 109    Resp: 18    Temp: 98 F (36.7 C)  98 F (36.7 C)  TempSrc: Oral  Oral  SpO2:  96%    Constitutional: appears age-appropriate, frail, cachectic appearing Eyes: PERRL, lids and conjunctivae normal ENMT: Mucous membranes are moist. Posterior pharynx clear of any exudate or lesions. Age-appropriate dentition. Hearing appropriate Neck: normal, supple, no masses, no thyromegaly Respiratory: clear to auscultation bilaterally, no wheezing, no crackles. Normal respiratory effort. No accessory muscle use.  Cardiovascular: Regular rate and rhythm, no murmurs / rubs / gallops. No extremity edema. 2+ pedal pulses. No carotid bruits.  Abdomen: no tenderness, no masses palpated, no hepatosplenomegaly. Bowel sounds positive.  Musculoskeletal: no clubbing / cyanosis. No joint deformity upper and lower extremities.  Generalized weakness.  No contractures.  Generalized decreased muscle tones. Skin: no rashes, lesions, ulcers. No induration Neurologic: Sensation intact. Strength 5/5 in all 4.  Psychiatric: Lacks judgment and insight, consistent with dementia. Alert and oriented x self  only.  Flat affect.  Depressed mood.   EKG: independently reviewed, showing sinus rhythm with rate of 91, QTc 469  Chest x-ray on Admission: I personally reviewed and I agree with radiologist reading as below.  DG Chest Portable 1 View  Result Date: 04/16/2023 CLINICAL DATA:  Fever EXAM: PORTABLE CHEST 1 VIEW COMPARISON:  12/03/2022 FINDINGS: Underinflation. No consolidation,  pneumothorax or effusion. No edema. Normal cardiopericardial silhouette. Calcified aorta overlapping cardiac leads. Right IJ double-lumen catheter with tip along the central SVC. Overlapping cardiac leads. IMPRESSION: Underinflation. No acute cardiopulmonary disease. Right IJ large-bore double-lumen catheter Electronically Signed   By: Karen Kays M.D.   On: 04/16/2023 14:57    Labs on Admission: I have personally reviewed following labs  CBC: Recent Labs  Lab 04/16/23 1147  WBC 7.6  HGB 10.2*  HCT 32.6*  MCV 92.6  PLT 129*   Basic Metabolic Panel: Recent Labs  Lab 04/16/23 1147  NA 136  K 3.5  CL 97*  CO2 27  GLUCOSE 128*  BUN 26*  CREATININE 4.98*  CALCIUM 8.9   GFR: CrCl cannot be calculated (Unknown ideal weight.).  Liver Function Tests: Recent Labs  Lab 04/16/23 1147  AST 16  ALT 7  ALKPHOS 68  BILITOT 0.8  PROT 7.0  ALBUMIN 2.7*   Urine analysis:    Component Value Date/Time   COLORURINE YELLOW (A) 12/05/2022 1344   APPEARANCEUR HAZY (A) 12/05/2022 1344   LABSPEC 1.011 12/05/2022 1344   PHURINE 5.0 12/05/2022 1344   GLUCOSEU 50 (A) 12/05/2022 1344   HGBUR SMALL (A) 12/05/2022 1344   BILIRUBINUR NEGATIVE 12/05/2022 1344   KETONESUR NEGATIVE 12/05/2022 1344   PROTEINUR 100 (A) 12/05/2022 1344   NITRITE NEGATIVE 12/05/2022 1344   LEUKOCYTESUR NEGATIVE 12/05/2022 1344   This document was prepared using Dragon Voice Recognition software and may include unintentional dictation errors.  Dr. Sedalia Muta [facility]  If 7PM-7AM, please contact overnight-coverage provider If  7AM-7PM, please contact day attending provider [email or web address]  04/16/2023, 5:37 PM

## 2023-04-16 NOTE — ED Notes (Signed)
In and out cath performed. No urine.

## 2023-04-16 NOTE — Consult Note (Signed)
CODE SEPSIS - PHARMACY COMMUNICATION  **Broad Spectrum Antibiotics should be administered within 1 hour of Sepsis diagnosis**  Time Code Sepsis Called/Page Received: 1535  Antibiotics Ordered: 1544  Time of 1st antibiotic administration: Ceftriaxone   Additional action taken by pharmacy: N/A  If necessary, Name of Provider/Nurse Contacted: N/A    Orson Aloe ,PharmD Clinical Pharmacist  04/16/2023  4:45 PM

## 2023-04-16 NOTE — Assessment & Plan Note (Signed)
At baseline 

## 2023-04-16 NOTE — Assessment & Plan Note (Signed)
Home Eliquis 2.5 mg p.o. twice daily resumed

## 2023-04-16 NOTE — Assessment & Plan Note (Addendum)
Home amlodipine not resumed on admission due to patient with low normotensive on admission Home Coreg 12.5 mg p.o. twice daily and Imdur 30 mg daily resumed for 04/17/2023 Home clonidine 0.1 mg p.o. twice daily as needed for SBP greater than 175 resumed on admission

## 2023-04-17 ENCOUNTER — Encounter
Admission: EM | Disposition: A | Discharge status: Hospice | Payer: Self-pay | Source: Home / Self Care | Attending: Internal Medicine

## 2023-04-17 DIAGNOSIS — Z7189 Other specified counseling: Secondary | ICD-10-CM | POA: Diagnosis not present

## 2023-04-17 DIAGNOSIS — R509 Fever, unspecified: Secondary | ICD-10-CM | POA: Diagnosis not present

## 2023-04-17 DIAGNOSIS — T8249XA Other complication of vascular dialysis catheter, initial encounter: Secondary | ICD-10-CM

## 2023-04-17 DIAGNOSIS — R531 Weakness: Secondary | ICD-10-CM | POA: Diagnosis not present

## 2023-04-17 DIAGNOSIS — R6889 Other general symptoms and signs: Secondary | ICD-10-CM

## 2023-04-17 DIAGNOSIS — I4891 Unspecified atrial fibrillation: Secondary | ICD-10-CM | POA: Diagnosis not present

## 2023-04-17 DIAGNOSIS — A411 Sepsis due to other specified staphylococcus: Secondary | ICD-10-CM | POA: Diagnosis not present

## 2023-04-17 DIAGNOSIS — Z9889 Other specified postprocedural states: Secondary | ICD-10-CM

## 2023-04-17 DIAGNOSIS — I96 Gangrene, not elsewhere classified: Secondary | ICD-10-CM | POA: Diagnosis present

## 2023-04-17 DIAGNOSIS — I739 Peripheral vascular disease, unspecified: Secondary | ICD-10-CM | POA: Diagnosis present

## 2023-04-17 DIAGNOSIS — B957 Other staphylococcus as the cause of diseases classified elsewhere: Secondary | ICD-10-CM | POA: Diagnosis present

## 2023-04-17 DIAGNOSIS — N186 End stage renal disease: Secondary | ICD-10-CM

## 2023-04-17 HISTORY — PX: DIALYSIS/PERMA CATHETER REMOVAL: CATH118289

## 2023-04-17 LAB — URINALYSIS, COMPLETE (UACMP) WITH MICROSCOPIC
Glucose, UA: 50 mg/dL — AB
Ketones, ur: NEGATIVE mg/dL
Nitrite: NEGATIVE
Protein, ur: 100 mg/dL — AB
Specific Gravity, Urine: 1.017 (ref 1.005–1.030)
pH: 8 (ref 5.0–8.0)

## 2023-04-17 LAB — BLOOD CULTURE ID PANEL (REFLEXED) - BCID2

## 2023-04-17 LAB — GLUCOSE, CAPILLARY: Glucose-Capillary: 186 mg/dL — ABNORMAL HIGH (ref 70–99)

## 2023-04-17 LAB — HEPATITIS B SURFACE ANTIGEN: Hepatitis B Surface Ag: NONREACTIVE

## 2023-04-17 LAB — CBC
HCT: 29.3 % — ABNORMAL LOW (ref 36.0–46.0)
Hemoglobin: 9.2 g/dL — ABNORMAL LOW (ref 12.0–15.0)
MCH: 29.3 pg (ref 26.0–34.0)
MCHC: 31.4 g/dL (ref 30.0–36.0)
MCV: 93.3 fL (ref 80.0–100.0)
Platelets: 125 10*3/uL — ABNORMAL LOW (ref 150–400)
RBC: 3.14 MIL/uL — ABNORMAL LOW (ref 3.87–5.11)
RDW: 15 % (ref 11.5–15.5)
WBC: 8.2 10*3/uL (ref 4.0–10.5)
nRBC: 0 % (ref 0.0–0.2)

## 2023-04-17 LAB — BASIC METABOLIC PANEL
Anion gap: 7 (ref 5–15)
BUN: 31 mg/dL — ABNORMAL HIGH (ref 8–23)
CO2: 26 mmol/L (ref 22–32)
Calcium: 8.1 mg/dL — ABNORMAL LOW (ref 8.9–10.3)
Chloride: 100 mmol/L (ref 98–111)
Creatinine, Ser: 5.21 mg/dL — ABNORMAL HIGH (ref 0.44–1.00)
GFR, Estimated: 8 mL/min — ABNORMAL LOW (ref >60)
Glucose, Bld: 99 mg/dL (ref 70–99)
Potassium: 3.2 mmol/L — ABNORMAL LOW (ref 3.5–5.1)
Sodium: 133 mmol/L — ABNORMAL LOW (ref 135–145)

## 2023-04-17 LAB — PROCALCITONIN: Procalcitonin: 5.89 ng/mL

## 2023-04-17 LAB — LACTIC ACID, PLASMA
Lactic Acid, Venous: 1.6 mmol/L (ref 0.5–1.9)
Lactic Acid, Venous: 1.4 mmol/L (ref 0.5–1.9)

## 2023-04-17 LAB — MRSA NEXT GEN BY PCR, NASAL: MRSA by PCR Next Gen: NOT DETECTED

## 2023-04-17 SURGERY — DIALYSIS/PERMA CATHETER REMOVAL
Anesthesia: LOCAL | Laterality: Right

## 2023-04-17 MED ORDER — MAGNESIUM SULFATE 2 GM/50ML IV SOLN
2.0000 g | Freq: Once | INTRAVENOUS | Status: AC
  Administered 2023-04-17: 2 g via INTRAVENOUS
  Filled 2023-04-17: qty 50

## 2023-04-17 MED ORDER — SODIUM CHLORIDE 0.9 % IV SOLN: 250.0000 mL | INTRAVENOUS | Status: DC

## 2023-04-17 MED ORDER — PHENYLEPHRINE HCL-NACL 20-0.9 MG/250ML-% IV SOLN
25.0000 ug/min | INTRAVENOUS | Status: DC
  Administered 2023-04-18: 25 ug/min via INTRAVENOUS
  Administered 2023-04-18: 75 ug/min via INTRAVENOUS
  Filled 2023-04-17 (×2): qty 250

## 2023-04-17 MED ORDER — HYDROXYZINE HCL 50 MG PO TABS: 25.0000 mg | ORAL_TABLET | Freq: Three times a day (TID) | ORAL | Status: DC | PRN

## 2023-04-17 MED ORDER — VANCOMYCIN HCL 750 MG/150ML IV SOLN
750.0000 mg | INTRAVENOUS | Status: DC
  Filled 2023-04-17: qty 150

## 2023-04-17 MED ORDER — LACTATED RINGERS IV BOLUS
500.0000 mL | Freq: Once | INTRAVENOUS | Status: AC
  Administered 2023-04-17: 500 mL via INTRAVENOUS

## 2023-04-17 MED ORDER — LACTATED RINGERS IV BOLUS (SEPSIS)
500.0000 mL | Freq: Once | INTRAVENOUS | Status: AC
  Administered 2023-04-17: 500 mL via INTRAVENOUS

## 2023-04-17 MED ORDER — NEPRO/CARBSTEADY PO LIQD: 237.0000 mL | Freq: Three times a day (TID) | ORAL | Status: DC | PRN

## 2023-04-17 MED ORDER — INSULIN ASPART 100 UNIT/ML IJ SOLN: 0.0000 [IU] | Freq: Three times a day (TID) | INTRAMUSCULAR | Status: DC

## 2023-04-17 MED ORDER — ALTEPLASE 2 MG IJ SOLR: 2.0000 mg | Freq: Once | INTRAMUSCULAR | Status: DC | PRN

## 2023-04-17 MED ORDER — MIDODRINE HCL 5 MG PO TABS
10.0000 mg | ORAL_TABLET | Freq: Three times a day (TID) | ORAL | Status: DC
  Administered 2023-04-17: 10 mg via ORAL
  Filled 2023-04-17 (×3): qty 2

## 2023-04-17 MED ORDER — HEPARIN SODIUM (PORCINE) 1000 UNIT/ML DIALYSIS: 1000.0000 [IU] | INTRAMUSCULAR | Status: DC | PRN

## 2023-04-17 MED ORDER — HEPARIN SODIUM (PORCINE) 1000 UNIT/ML IJ SOLN
INTRAMUSCULAR | Status: AC
  Filled 2023-04-17: qty 10

## 2023-04-17 MED ORDER — POTASSIUM CHLORIDE CRYS ER 20 MEQ PO TBCR
40.0000 meq | EXTENDED_RELEASE_TABLET | ORAL | Status: AC
  Administered 2023-04-17 (×2): 40 meq via ORAL
  Filled 2023-04-17 (×2): qty 2

## 2023-04-17 MED ORDER — CAMPHOR-MENTHOL 0.5-0.5 % EX LOTN: 1.0000 | TOPICAL_LOTION | Freq: Three times a day (TID) | CUTANEOUS | Status: DC | PRN

## 2023-04-17 MED ORDER — LIDOCAINE-PRILOCAINE 2.5-2.5 % EX CREA: 1.0000 | TOPICAL_CREAM | CUTANEOUS | Status: DC | PRN

## 2023-04-17 MED ORDER — MIRTAZAPINE 15 MG PO TABS
7.5000 mg | ORAL_TABLET | Freq: Every day | ORAL | Status: DC
  Administered 2023-04-17 – 2023-04-19 (×3): 7.5 mg via ORAL
  Filled 2023-04-17 (×3): qty 1

## 2023-04-17 MED ORDER — DOCUSATE SODIUM 283 MG RE ENEM
1.0000 | ENEMA | RECTAL | Status: DC | PRN
  Filled 2023-04-17: qty 1

## 2023-04-17 MED ORDER — DILTIAZEM HCL 25 MG/5ML IV SOLN
5.0000 mg | Freq: Once | INTRAVENOUS | Status: AC
  Administered 2023-04-17: 5 mg via INTRAVENOUS
  Filled 2023-04-17: qty 5

## 2023-04-17 MED ORDER — CALCIUM CARBONATE ANTACID 1250 MG/5ML PO SUSP: 500.0000 mg | Freq: Four times a day (QID) | ORAL | Status: DC | PRN

## 2023-04-17 MED ORDER — PENTAFLUOROPROP-TETRAFLUOROETH EX AERO: 1.0000 | INHALATION_SPRAY | CUTANEOUS | Status: DC | PRN

## 2023-04-17 MED ORDER — METRONIDAZOLE 500 MG/100ML IV SOLN
500.0000 mg | Freq: Two times a day (BID) | INTRAVENOUS | Status: DC
  Administered 2023-04-17 – 2023-04-18 (×3): 500 mg via INTRAVENOUS
  Filled 2023-04-17 (×3): qty 100

## 2023-04-17 MED ORDER — SORBITOL 70 % SOLN: 30.0000 mL | Status: DC | PRN

## 2023-04-17 MED ORDER — LIDOCAINE-EPINEPHRINE 1 %-1:100000 IJ SOLN
20.0000 mL | Freq: Once | INTRAMUSCULAR | Status: DC
  Filled 2023-04-17: qty 20

## 2023-04-17 MED ORDER — DILTIAZEM LOAD VIA INFUSION
10.0000 mg | Freq: Once | INTRAVENOUS | Status: DC
  Filled 2023-04-17: qty 10

## 2023-04-17 MED ORDER — DILTIAZEM HCL-DEXTROSE 125-5 MG/125ML-% IV SOLN (PREMIX): 5.0000 mg/h | INTRAVENOUS | Status: DC

## 2023-04-17 MED ORDER — VANCOMYCIN HCL 1500 MG/300ML IV SOLN
1500.0000 mg | Freq: Once | INTRAVENOUS | Status: AC
  Administered 2023-04-17: 1500 mg via INTRAVENOUS
  Filled 2023-04-17: qty 300

## 2023-04-17 SURGICAL SUPPLY — 2 items
FORCEPS HALSTEAD CVD 5IN STRL (INSTRUMENTS) IMPLANT
TRAY LACERAT/PLASTIC (MISCELLANEOUS) IMPLANT

## 2023-04-17 NOTE — Progress Notes (Signed)
Pt. Given 02 4l/[place] d/t o2 saturation dropping to the 60s. Pt. Alert and denies c/o sob. O2 sat now at 100% on 4l. Jon Gills, NP aware. Will continue to monitor.

## 2023-04-17 NOTE — Progress Notes (Signed)
PHARMACY - PHYSICIAN COMMUNICATION CRITICAL VALUE ALERT - BLOOD CULTURE IDENTIFICATION (BCID)  Alexis Tucker is an 82 y.o. female who presented to [facility] on 04/16/2023 with a chief complaint of weakness, lethargy and fever. Medical history significant for HD MWF - missed her HD yesterday 10/14 and received it today. Has fistula with right subclavian catheter as potential sources for bacteremia.   Assessment:  Blood cx from 10/14 have GPC in aerobic bottle of each set; BCID detects methicillin resistant S. epidermidis   Name of physician (or Provider) Contacted: Dr. Basil Dess  Current antibiotics:  Ceftriaxone 2g Q24H Azithromycin 500 mg Q24H  Changes to prescribed antibiotics recommended:  Vancomycin with dialysis  Accepted by MD Other changes recommended by MD Metronidazole 500 mg Q12H Continue ceftriaxone and discontinue azithromycin  For coverage from potential urine and feet pending further urine and blood culture data.  Results for orders placed or performed during the hospital encounter of 04/16/23  Blood Culture ID Panel (Reflexed) (Collected: 04/16/2023  3:39 PM)  Result Value Ref Range   Enterococcus faecalis NOT DETECTED NOT DETECTED   Enterococcus Faecium NOT DETECTED NOT DETECTED   Listeria monocytogenes NOT DETECTED NOT DETECTED   Staphylococcus species DETECTED (A) NOT DETECTED   Staphylococcus aureus (BCID) NOT DETECTED NOT DETECTED   Staphylococcus epidermidis DETECTED (A) NOT DETECTED   Staphylococcus lugdunensis NOT DETECTED NOT DETECTED   Streptococcus species NOT DETECTED NOT DETECTED   Streptococcus agalactiae NOT DETECTED NOT DETECTED   Streptococcus pneumoniae NOT DETECTED NOT DETECTED   Streptococcus pyogenes NOT DETECTED NOT DETECTED   A.calcoaceticus-baumannii NOT DETECTED NOT DETECTED   Bacteroides fragilis NOT DETECTED NOT DETECTED   Enterobacterales NOT DETECTED NOT DETECTED   Enterobacter cloacae complex NOT DETECTED NOT DETECTED    Escherichia coli NOT DETECTED NOT DETECTED   Klebsiella aerogenes NOT DETECTED NOT DETECTED   Klebsiella oxytoca NOT DETECTED NOT DETECTED   Klebsiella pneumoniae NOT DETECTED NOT DETECTED   Proteus species NOT DETECTED NOT DETECTED   Salmonella species NOT DETECTED NOT DETECTED   Serratia marcescens NOT DETECTED NOT DETECTED   Haemophilus influenzae NOT DETECTED NOT DETECTED   Neisseria meningitidis NOT DETECTED NOT DETECTED   Pseudomonas aeruginosa NOT DETECTED NOT DETECTED   Stenotrophomonas maltophilia NOT DETECTED NOT DETECTED   Candida albicans NOT DETECTED NOT DETECTED   Candida auris NOT DETECTED NOT DETECTED   Candida glabrata NOT DETECTED NOT DETECTED   Candida krusei NOT DETECTED NOT DETECTED   Candida parapsilosis NOT DETECTED NOT DETECTED   Candida tropicalis NOT DETECTED NOT DETECTED   Cryptococcus neoformans/gattii NOT DETECTED NOT DETECTED   Methicillin resistance mecA/C DETECTED (A) NOT DETECTED    Effie Shy, PharmD Pharmacy Resident  04/17/2023 1:01 PM

## 2023-04-17 NOTE — Plan of Care (Signed)
  Problem: Respiratory: Goal: Ability to maintain adequate ventilation will improve Outcome: Progressing   Problem: Education: Goal: Knowledge of General Education information will improve Description: Including pain rating scale, medication(s)/side effects and non-pharmacologic comfort measures Outcome: Progressing   Problem: Health Behavior/Discharge Planning: Goal: Ability to manage health-related needs will improve Outcome: Progressing   Problem: Pain Managment: Goal: General experience of comfort will improve Outcome: Progressing

## 2023-04-17 NOTE — Consult Note (Signed)
Consultation Note Date: 04/17/2023   Patient Name: Alexis Tucker  DOB: 08-14-1940  MRN: [id number]  Age / Sex: 82 y.o., female  PCP: Eustaquio Boyden, MD Referring Physician: Jonah Blue, MD  Reason for Consultation: Establishing goals of care  HPI/Patient Profile: PER H&P Ms. Camira Shirar is a 82 year old female with history of hypertension, hypothyroid, end-stage renal disease on hemodialysis MWF, hypothyroid, status post right AV graft placement by vascular, paroxysmal atrial fibrillation, left-sided breast cancer ER positive, anemia of chronic disease, who presents to the emergency department for chief concerns of weakness and lethargy and fever.   Clinical Assessment and Goals of Care: Notes and labs reviewed.  Spoke with attending regarding case in depth.  Into see patient.  She is currently resting in bed and denies complaint but does not say anything more.  Nursing is at bedside getting ready to move patient to a to a bed.  They advise daughter is already in the room.  Went to 2A and met with daughter.  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  Daughter states she is only living child in this patient's H POA.  She states her mother has voiced being tired of doing dialysis.  She states she has tried to pull her dialysis catheter in the past.  She states other times her mother will ask to go to dialysis, so she is unsure of how her mother really feels.  She would like to meet in the morning with PMT and her mother to discuss goals of care moving forward.  During this visit staff arrived to inform the patient is going to ICU 17 as there are issues with her heart rate and blood pressure.  Took daughter to ICU unit.  Discussed that patient's mother may not be able to have goals of  care in the morning depending on her frailty.  Daughter states she really wants to try to have goals of care discussion in the morning to give her mother the chance to participate.  She is aware that she may need to make decisions overnight.  She advises that her mother was under hospice care about a year ago and was able to graduate from hospice.  She confirms DNR/DNI status.    SUMMARY OF RECOMMENDATIONS   DNR/DNI Daughter would really like for mother to attempt to participate in goals of care conversation in the morning.  She is aware that she may need to make decisions overnight.   Prognosis:  Poor       Primary Diagnoses: Present on Admission:  Anemia of chronic disease  DNR (do not resuscitate)  HTN (hypertension)  OA (osteoarthritis)  Chronic atrial fibrillation with RVR (HCC)  Post-surgical hypothyroidism  Protein-calorie malnutrition, severe (HCC)  Staphylococcus epidermidis bacteremia  Sepsis due to Staphylococcus epidermidis (HCC)  Type 2 diabetes mellitus with other specified complication (HCC)  PVD (peripheral vascular disease) (HCC)  Gangrene of toe of both feet (  HCC)   I have reviewed the medical record, interviewed the patient and family, and examined the patient. The following aspects are pertinent.  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   a.) moderately differentiated IMC (G2, T1N0Mx); s/p lumpectomy + adjuvant XRT   Breast cancer, left (HCC) 11/27/2017   a.) IMC (mpT2 pN0, G2, ER/PR positive, HER-2/neu negative); b.) mammosite   Carotid stenosis, asymptomatic 01/10/2013   a.) carotid dopplers  01/10/2013, 02/27/2014, 09/10/2017, 07/26/2020: >50% BECA, 1-39% BICA   Cataracts, bilateral    Chronic diastolic CHF (congestive heart failure) (HCC)    a.) TTE 11/26/2007: EF 60%, LVH, triv TR/MR, AoV sclerosis with no stenosis, G1DD; b.) TTE 05/04/2016: EF 60-65%, LAE, G1DD; c.) TTE 11/16/2021: EF 55-60%, LVH, RVE, mild-mod LAE, mod MAC, mod MR, mild TR/AR, G2DD.   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   a.) Avastin injections as [facility]   DJD (degenerative joint disease)    ESRD (end stage renal disease) (HCC)    GERD (gastroesophageal reflux disease)    Glaucoma 03/2014   History of colon cancer 2006   a.) s/p colectomy   History of hepatitis A 1990s   from food, resolved   HTN (hypertension)    Hyperplastic colon polyp    Long term current use of anticoagulant    a.) apixaban   Long term current use of antithrombotics/antiplatelets    a.) pentoxifylline   Macular degeneration    wet R with vision loss, dry L; to see retina specialist   Neuropathy    OA (osteoarthritis)    Osteonecrosis (HCC)    hips?   PONV (postoperative nausea and vomiting)    Post-surgical hypothyroidism    Thrombocytopenia (HCC)    Tubular adenoma    Wears dentures    partial upper   Social History   Socioeconomic History   Marital status: Divorced    Spouse name: Not on file   Number of children: 1   Years of education: Not on file   Highest education level: Not on file  Occupational History   Not on file  Tobacco Use   Smoking status: Former    Passive exposure: Past   Smokeless tobacco: Never   Tobacco comments:    quit over 40 yrs ago  Vaping Use   Vaping status: Never Used  Substance and Sexual Activity    Alcohol use: Never    Comment: rare   Drug use: Never   Sexual activity: Not Currently  Other Topics Concern   Not on file  Social History Narrative   Lives alone,   Granddaughter in [place] (attorney).   Divorced   Occu: retired, worked in Consulting civil engineer   Edu: college   Has 1 living daughter. (One passed)   Social Determinants of Health   Financial Resource Strain: Low Risk  (  02/13/2023)   Overall Financial Resource Strain (CARDIA)    Difficulty of Paying Living Expenses: Not hard at all  Food Insecurity: No Food Insecurity (02/13/2023)   Hunger Vital Sign    Worried About Running Out of Food in the Last Year: Never true    Ran Out of Food in the Last Year: Never true  Transportation Needs: No Transportation Needs (02/13/2023)   PRAPARE - Administrator, Civil Service (Medical): No    Lack of Transportation (Non-Medical): No  Physical Activity: Inactive (02/13/2023)   Exercise Vital Sign    Days of Exercise per Week: 0 days    Minutes of Exercise per Session: 0 min  Stress: No Stress Concern Present (02/13/2023)   [employer] of Occupational Health - Occupational Stress Questionnaire    Feeling of Stress : Not at all  Social Connections: Socially Isolated (02/13/2023)   Social Connection and Isolation Panel [NHANES]    Frequency of Communication with Friends and Family: More than three times a week    Frequency of Social Gatherings with Friends and Family: More than three times a week    Attends Religious Services: Never    Database administrator or Organizations: No    Attends Engineer, structural: Never    Marital Status: Divorced   Family History  Problem Relation Age of Onset   Diabetes Mother    Hypertension Mother    Arthritis Mother    Alcohol abuse Father    Arthritis Father    Stroke Daughter    CAD Neg Hx    Cancer Neg Hx    Breast cancer Neg Hx    Scheduled Meds:  allopurinol  100 mg Oral Daily   anastrozole  1 mg Oral Daily   apixaban  2.5  mg Oral BID   aspirin EC  81 mg Oral Daily   atorvastatin  10 mg Oral Daily   carvedilol  12.5 mg Oral BID   Chlorhexidine Gluconate Cloth  6 each Topical Q0600   cholecalciferol  25 mcg Oral Daily   diltiazem  10 mg Intravenous Once   insulin aspart  0-6 Units Subcutaneous TID WC   isosorbide mononitrate  30 mg Oral Daily   levothyroxine  88 mcg Oral QAC breakfast   lidocaine-EPINEPHrine  20 mL Intradermal Once   mirtazapine  7.5 mg Oral QHS   potassium chloride  40 mEq Oral Q4H   Ensure Max Protein  11 oz Oral BID AC & HS   sertraline  25 mg Oral Daily   Continuous Infusions:  cefTRIAXone (ROCEPHIN)  IV 2 g (04/17/23 1211)   diltiazem (CARDIZEM) infusion     lactated ringers 500 mL (04/17/23 1549)   magnesium sulfate bolus IVPB     metronidazole 500 mg (04/17/23 1457)   vancomycin     [START ON 04/18/2023] vancomycin     PRN Meds:.acetaminophen **OR** acetaminophen, bisacodyl, calcium carbonate (dosed in mg elemental calcium), camphor-menthol **AND** hydrOXYzine, docusate sodium, feeding supplement (NEPRO CARB STEADY), ondansetron **OR** ondansetron (ZOFRAN) IV, senna-docusate, sorbitol Medications Prior to Admission:  Prior to Admission medications   Medication Sig Start Date End Date Taking? Authorizing Provider  allopurinol (ZYLOPRIM) 100 MG tablet Take 1 tablet (100 mg total) by mouth daily. For gout 10/03/22  Yes Eustaquio Boyden, MD  amLODipine (NORVASC) 2.5 MG tablet Take 2.5 mg by mouth daily. 04/10/23  Yes [provider]  anastrozole (ARIMIDEX) 1 MG tablet TAKE 1 TABLET BY MOUTH EVERY DAY 11/13/22  Yes Earna Coder, MD  apixaban (ELIQUIS) 2.5 MG TABS tablet Take 1 tablet (2.5 mg total) by mouth 2 (two) times daily. As blood thinner 02/13/23  Yes Eustaquio Boyden, MD  aspirin EC 81 MG tablet Take 1 tablet (81 mg total) by mouth daily. Swallow whole. 03/22/23 03/21/24 Yes Dew, Marlow Baars, MD  atorvastatin (LIPITOR) 10 MG tablet Take 1 tablet (10 mg total) by  mouth daily. 03/22/23 03/21/24 Yes Dew, Marlow Baars, MD  carvedilol (COREG) 12.5 MG tablet Take 12.5 mg by mouth 2 (two) times daily. 04/15/23  Yes [provider]  Cholecalciferol (VITAMIN D-3) 5000 UNITS TABS Take 1 tablet by mouth daily.   Yes [provider]  cloNIDine HCl (KAPVAY) 0.1 MG TB12 ER tablet Take 0.1 mg by mouth 2 (two) times daily. PRN   Yes [provider]  isosorbide mononitrate (IMDUR) 30 MG 24 hr tablet Take 1 tablet (30 mg total) by mouth daily. 11/01/22 03/12/24 Yes Agbor-Etang, Arlys John, MD  levothyroxine (SYNTHROID) 88 MCG tablet Take 1 tablet (88 mcg total) by mouth daily. For hypothyroidism 02/13/23  Yes Eustaquio Boyden, MD  linagliptin (TRADJENTA) 5 MG TABS tablet Take 1 tablet (5 mg total) by mouth daily. 02/13/23  Yes Eustaquio Boyden, MD  pantoprazole (PROTONIX) 40 MG tablet Take 1 tablet (40 mg total) by mouth daily as needed. For reflux 10/03/22  Yes Eustaquio Boyden, MD  sertraline (ZOLOFT) 25 MG tablet Take 1 tablet (25 mg total) by mouth daily. 02/13/23  Yes Eustaquio Boyden, MD   Allergies  Allergen Reactions   Tramadol Other (See Comments)    nightmares   Codeine Anxiety   Sulfa Antibiotics Itching and Rash   Review of Systems  All other systems reviewed and are negative.   Physical Exam Pulmonary:     Effort: Pulmonary effort is normal.  Neurological:     Mental Status: She is alert.     Vital Signs: BP (!) 82/45 (BP Location: Right Arm)   Pulse 87   Temp (!) 101.8 F (38.8 C) (Oral)   Resp 18   Wt 65.6 kg   SpO2 98% Comment: very difficult to obtain  BMI 25.62 kg/m  Pain Scale: 0-10   Pain Score: 3    SpO2: SpO2: 98 % (very difficult to obtain) O2 Device:SpO2: 98 % (very difficult to obtain) O2 Flow Rate: .O2 Flow Rate (L/min): 4 L/min  IO: Intake/output summary:  Intake/Output Summary (Last 24 hours) at 04/17/2023 1552 Last data filed at 04/17/2023 0540 Gross per 24 hour  Intake 400 ml  Output 100 ml  Net 300  ml    LBM: Last BM Date : 04/15/23 Baseline Weight: Weight: 65.6 kg Most recent weight: Weight: 65.6 kg       Signed by: Morton Stall, NP   Please contact Palliative Medicine Team phone at [phone] for questions and concerns.  For individual provider: See Loretha Stapler

## 2023-04-17 NOTE — Consult Note (Signed)
CODE SEPSIS - PHARMACY COMMUNICATION  **Broad-spectrum antimicrobials should be administered within one hour of sepsis diagnosis**  Time Code Sepsis call or page was received: 1510  Antibiotics ordered: Ceftriaxone, Vancomycin, Metronidazole  Time of first antibiotic administration: 1211  Additional action taken by pharmacy: N/A  If necessary, name of provider/nurse contacted: N/A    Will M. Dareen Piano, PharmD Clinical Pharmacist 04/17/2023 3:13 PM

## 2023-04-17 NOTE — Progress Notes (Addendum)
[facility]  ROUNDING NOTE   Subjective:   Alexis Tucker is a 82 y.o. female with breast cancer, hypertension, hypothyroidism, GERD, atrial fibrillation, and end stage renal disease on hemodialysis. Patient presents to the ED with altered mental status and febrile. She has been admitted for Altered mental status [R41.82] Weakness [R53.1] Fever, unspecified fever cause [R50.9]  Patient is known to our practice from precious admissions and received outpatient dialysis at [facility] on a MWF schedule, supervised by Dr Wynelle Link. Her last treatment was on Friday. She arrived in ED yesterday when she was too weak for daughter to get her into car for dialysis. She is seen and evaluated today during dialysis   HEMODIALYSIS FLOWSHEET:  Blood Flow Rate (mL/min): 399 mL/min Arterial Pressure (mmHg): -204.03 mmHg Venous Pressure (mmHg): 183.63 mmHg TMP (mmHg): 4.44 mmHg Ultrafiltration Rate (mL/min): 680 mL/min Dialysate Flow Rate (mL/min): 299 ml/min  Patient is alert and oriented to self. Room air on arrival. Denies pain.   Labs on ED arrival unremarkable for renal patient. Respiratory panel negative for flu, RSV, and covid 19. UA appears turbid, with leukocytes and bacteria. Blood cultures positive for gram positive cocci. Urine culture pending. Chest xray negative. Febrile on arrival.   We have been consulted to manage dialysis needs.   Objective:  Vital signs in last 24 hours:  Temp:  [98 F (36.7 C)-99.8 F (37.7 C)] 99.8 F (37.7 C) (10/15 1057) Pulse Rate:  [66-144] 144 (10/15 0938) Resp:  [9-20] 20 (10/15 1057) BP: (92-162)/(49-104) 138/86 (10/15 1128) SpO2:  [90 %-100 %] 98 % (10/15 1057) Weight:  [65.6 kg] 65.6 kg (10/15 0824)  Weight change:  Filed Weights   04/17/23 0824  Weight: 65.6 kg    Intake/Output: I/O last 3 completed shifts: In: 400 [P.O.:300; IV Piggyback:100] Out: 100 [Urine:100]   Intake/Output this shift:  No intake/output data  recorded.  Physical Exam: General: NAD  Head: Normocephalic, atraumatic.  Dry oral mucosal membranes  Eyes: Anicteric  Lungs:  Clear to auscultation  Heart: Regular rate and rhythm  Abdomen:  Soft, nontender  Extremities:  No peripheral edema.  Neurologic: Alert, moving all four extremities  Skin: No lesions  Access: Rt chest permcath    Basic Metabolic Panel: Recent Labs  Lab 04/16/23 1147 04/17/23 0837  NA 136 133*  K 3.5 3.2*  CL 97* 100  CO2 27 26  GLUCOSE 128* 99  BUN 26* 31*  CREATININE 4.98* 5.21*  CALCIUM 8.9 8.1*    Liver Function Tests: Recent Labs  Lab 04/16/23 1147  AST 16  ALT 7  ALKPHOS 68  BILITOT 0.8  PROT 7.0  ALBUMIN 2.7*   No results for input(s): "LIPASE", "AMYLASE" in the last 168 hours. No results for input(s): "AMMONIA" in the last 168 hours.  CBC: Recent Labs  Lab 04/16/23 1147 04/17/23 0837  WBC 7.6 8.2  HGB 10.2* 9.2*  HCT 32.6* 29.3*  MCV 92.6 93.3  PLT 129* 125*    Cardiac Enzymes: No results for input(s): "CKTOTAL", "CKMB", "CKMBINDEX", "TROPONINI" in the last 168 hours.  BNP: Invalid input(s): "POCBNP"  CBG: No results for input(s): "GLUCAP" in the last 168 hours.  Microbiology: Results for orders placed or performed during the hospital encounter of 04/16/23  Resp panel by RT-PCR (RSV, Flu A&B, Covid) Anterior Nasal Swab     Status: None   Collection Time: 04/16/23 12:08 PM   Specimen: Anterior Nasal Swab  Result Value Ref Range Status   SARS Coronavirus 2  by RT PCR NEGATIVE NEGATIVE Final    Comment: (NOTE) SARS-CoV-2 target nucleic acids are NOT DETECTED.  The SARS-CoV-2 RNA is generally detectable in upper respiratory specimens during the acute phase of infection. The lowest concentration of SARS-CoV-2 viral copies this assay can detect is 138 copies/mL. A negative result does not preclude SARS-Cov-2 infection and should not be used as the sole basis for treatment or other patient management decisions. A  negative result may occur with  improper specimen collection/handling, submission of specimen other than nasopharyngeal swab, presence of viral mutation(s) within the areas targeted by this assay, and inadequate number of viral copies(<138 copies/mL). A negative result must be combined with clinical observations, patient history, and epidemiological information. The expected result is Negative.  Fact Sheet for Patients:  BloggerCourse.com  Fact Sheet for Healthcare Providers:  SeriousBroker.it  This test is no t yet approved or cleared by the [place] FDA and  has been authorized for detection and/or diagnosis of SARS-CoV-2 by FDA under an Emergency Use Authorization (EUA). This EUA will remain  in effect (meaning this test can be used) for the duration of the COVID-19 declaration under Section 564(b)(1) of the Act, 21 U.S.C.section 360bbb-3(b)(1), unless the authorization is terminated  or revoked sooner.       Influenza A by PCR NEGATIVE NEGATIVE Final   Influenza B by PCR NEGATIVE NEGATIVE Final    Comment: (NOTE) The Xpert Xpress SARS-CoV-2/FLU/RSV plus assay is intended as an aid in the diagnosis of influenza from Nasopharyngeal swab specimens and should not be used as a sole basis for treatment. Nasal washings and aspirates are unacceptable for Xpert Xpress SARS-CoV-2/FLU/RSV testing.  Fact Sheet for Patients: BloggerCourse.com  Fact Sheet for Healthcare Providers: SeriousBroker.it  This test is not yet approved or cleared by the [place] FDA and has been authorized for detection and/or diagnosis of SARS-CoV-2 by FDA under an Emergency Use Authorization (EUA). This EUA will remain in effect (meaning this test can be used) for the duration of the COVID-19 declaration under Section 564(b)(1) of the Act, 21 U.S.C. section 360bbb-3(b)(1), unless the authorization  is terminated or revoked.     Resp Syncytial Virus by PCR NEGATIVE NEGATIVE Final    Comment: (NOTE) Fact Sheet for Patients: BloggerCourse.com  Fact Sheet for Healthcare Providers: SeriousBroker.it  This test is not yet approved or cleared by the [place] FDA and has been authorized for detection and/or diagnosis of SARS-CoV-2 by FDA under an Emergency Use Authorization (EUA). This EUA will remain in effect (meaning this test can be used) for the duration of the COVID-19 declaration under Section 564(b)(1) of the Act, 21 U.S.C. section 360bbb-3(b)(1), unless the authorization is terminated or revoked.  Performed at [facility], [address] [place], [place] [address]   Blood culture (routine x 2)     Status: None (Preliminary result)   Collection Time: 04/16/23  3:39 PM   Specimen: BLOOD  Result Value Ref Range Status   Specimen Description BLOOD LEFT ANTECUBITAL  Final   Special Requests   Final    BOTTLES DRAWN AEROBIC AND ANAEROBIC Blood Culture results may not be optimal due to an excessive volume of blood received in culture bottles   Culture  Setup Time   Final    GRAM POSITIVE COCCI AEROBIC BOTTLE ONLY CRITICAL RESULT CALLED TO, READ BACK BY AND VERIFIED WITH: ALEX CHAPPELL 04/17/23 5009 MW Performed at [facility], [address] [place], [place] [address]    Culture  GRAM POSITIVE COCCI  Final   Report Status PENDING  Incomplete  Blood culture (routine x 2)     Status: None (Preliminary result)   Collection Time: 04/16/23  3:39 PM   Specimen: BLOOD  Result Value Ref Range Status   Specimen Description BLOOD BLOOD RIGHT HAND  Final   Special Requests   Final    BOTTLES DRAWN AEROBIC AND ANAEROBIC Blood Culture adequate volume   Culture  Setup Time   Final    GRAM POSITIVE COCCI IN BOTH AEROBIC AND ANAEROBIC BOTTLES Organism ID to follow CRITICAL RESULT CALLED TO, READ BACK BY AND  VERIFIED WITH: ALEX CHAPPELL 04/17/23 1610 MW Performed at [facility], [address] [place], [place] [address]    Culture GRAM POSITIVE COCCI  Final   Report Status PENDING  Incomplete  Blood Culture ID Panel (Reflexed)     Status: Abnormal   Collection Time: 04/16/23  3:39 PM  Result Value Ref Range Status   Enterococcus faecalis NOT DETECTED NOT DETECTED Final   Enterococcus Faecium NOT DETECTED NOT DETECTED Final   Listeria monocytogenes NOT DETECTED NOT DETECTED Final   Staphylococcus species DETECTED (A) NOT DETECTED Final    Comment: CRITICAL RESULT CALLED TO, READ BACK BY AND VERIFIED WITH: ALEX CHAPPELL 04/17/23 0929 MW    Staphylococcus aureus (BCID) NOT DETECTED NOT DETECTED Final   Staphylococcus epidermidis DETECTED (A) NOT DETECTED Final    Comment: Methicillin (oxacillin) resistant coagulase negative staphylococcus. Possible blood culture contaminant (unless isolated from more than one blood culture draw or clinical case suggests pathogenicity). No antibiotic treatment is indicated for blood  culture contaminants. CRITICAL RESULT CALLED TO, READ BACK BY AND VERIFIED WITH: ALEX CHAPPELL 04/17/23 0929 MW    Staphylococcus lugdunensis NOT DETECTED NOT DETECTED Final   Streptococcus species NOT DETECTED NOT DETECTED Final   Streptococcus agalactiae NOT DETECTED NOT DETECTED Final   Streptococcus pneumoniae NOT DETECTED NOT DETECTED Final   Streptococcus pyogenes NOT DETECTED NOT DETECTED Final   A.calcoaceticus-baumannii NOT DETECTED NOT DETECTED Final   Bacteroides fragilis NOT DETECTED NOT DETECTED Final   Enterobacterales NOT DETECTED NOT DETECTED Final   Enterobacter cloacae complex NOT DETECTED NOT DETECTED Final   Escherichia coli NOT DETECTED NOT DETECTED Final   Klebsiella aerogenes NOT DETECTED NOT DETECTED Final   Klebsiella oxytoca NOT DETECTED NOT DETECTED Final   Klebsiella pneumoniae NOT DETECTED NOT DETECTED Final   Proteus species NOT  DETECTED NOT DETECTED Final   Salmonella species NOT DETECTED NOT DETECTED Final   Serratia marcescens NOT DETECTED NOT DETECTED Final   Haemophilus influenzae NOT DETECTED NOT DETECTED Final   Neisseria meningitidis NOT DETECTED NOT DETECTED Final   Pseudomonas aeruginosa NOT DETECTED NOT DETECTED Final   Stenotrophomonas maltophilia NOT DETECTED NOT DETECTED Final   Candida albicans NOT DETECTED NOT DETECTED Final   Candida auris NOT DETECTED NOT DETECTED Final   Candida glabrata NOT DETECTED NOT DETECTED Final   Candida krusei NOT DETECTED NOT DETECTED Final   Candida parapsilosis NOT DETECTED NOT DETECTED Final   Candida tropicalis NOT DETECTED NOT DETECTED Final   Cryptococcus neoformans/gattii NOT DETECTED NOT DETECTED Final   Methicillin resistance mecA/C DETECTED (A) NOT DETECTED Final    Comment: CRITICAL RESULT CALLED TO, READ BACK BY AND VERIFIED WITH: ALEX CHAPPELL 04/17/23 4098 MW Performed at [facility], [address] [place], [place] [address]     Coagulation Studies: No results for input(s): "LABPROT", "INR" in the last 72 hours.  Urinalysis:  Recent Labs    04/17/23 0527  COLORURINE YELLOW*  LABSPEC 1.017  PHURINE 8.0  GLUCOSEU 50*  HGBUR MODERATE*  BILIRUBINUR SMALL*  KETONESUR NEGATIVE  PROTEINUR 100*  NITRITE NEGATIVE  LEUKOCYTESUR LARGE*      Imaging: DG Chest Portable 1 View  Result Date: 04/16/2023 CLINICAL DATA:  Fever EXAM: PORTABLE CHEST 1 VIEW COMPARISON:  12/03/2022 FINDINGS: Underinflation. No consolidation, pneumothorax or effusion. No edema. Normal cardiopericardial silhouette. Calcified aorta overlapping cardiac leads. Right IJ double-lumen catheter with tip along the central SVC. Overlapping cardiac leads. IMPRESSION: Underinflation. No acute cardiopulmonary disease. Right IJ large-bore double-lumen catheter Electronically Signed   By: Karen Kays M.D.   On: 04/16/2023 14:57     Medications:    cefTRIAXone (ROCEPHIN)  IV  2 g (04/17/23 1211)   diltiazem (CARDIZEM) infusion     metronidazole     vancomycin     [START ON 04/18/2023] vancomycin      allopurinol  100 mg Oral Daily   anastrozole  1 mg Oral Daily   apixaban  2.5 mg Oral BID   aspirin EC  81 mg Oral Daily   atorvastatin  10 mg Oral Daily   carvedilol  12.5 mg Oral BID   Chlorhexidine Gluconate Cloth  6 each Topical Q0600   cholecalciferol  25 mcg Oral Daily   diltiazem  10 mg Intravenous Once   insulin aspart  0-6 Units Subcutaneous TID WC   isosorbide mononitrate  30 mg Oral Daily   levothyroxine  88 mcg Oral QAC breakfast   lidocaine-EPINEPHrine  20 mL Intradermal Once   mirtazapine  7.5 mg Oral QHS   Ensure Max Protein  11 oz Oral BID AC & HS   sertraline  25 mg Oral Daily   acetaminophen **OR** acetaminophen, bisacodyl, calcium carbonate (dosed in mg elemental calcium), camphor-menthol **AND** hydrOXYzine, docusate sodium, feeding supplement (NEPRO CARB STEADY), ondansetron **OR** ondansetron (ZOFRAN) IV, senna-docusate, sorbitol  Assessment/ Plan:  Alexis Tucker is a 82 y.o.  female with breast cancer, hypertension, hypothyroidism, GERD, atrial fibrillation, and end stage renal disease on hemodialysis. Patient presents to the ED with altered mental status and febrile. She has been admitted for Altered mental status [R41.82] Weakness [R53.1] Fever, unspecified fever cause [R50.9]   End-stage renal disease on hemodialysis.  Last treatment completed on Friday.  Patient receiving scheduled dialysis treatment today.  Treatment terminated early due to increased heart rate, decreased oxygen saturations and patient shivering.  Rapid response team called for assistance.  Patient placed on simple mask with saturations 89 to 90%.  Will allow patient to rest tomorrow.  Due to positive blood cultures, will have PermCath removed and allow for line free holiday.  May request HD temp cath on Thursday if treatment is needed.  2.  Sepsis with  bacteremia, blood cultures positive for gram-positive cocci.  Source unknown, likely HD PermCath versus UTI.  Also wounds noted on bilateral great toes.  Blood cultures obtained today.  Currently on broad-spectrum antibiotics.  Will remove HD PermCath.  3. Anemia of chronic kidney disease Lab Results  Component Value Date   HGB 9.2 (L) 04/17/2023    Hemoglobin within desired range.  May consider ESA.  Patient receives Mircera at outpatient clinic.  4. Secondary Hyperparathyroidism: with outpatient labs: PTH 206, phosphorus 2.2, calcium 9.3 on 04/09/23.   Lab Results  Component Value Date   PTH 311 (H) 06/11/2020   CALCIUM 8.1 (L) 04/17/2023   CAION 1.27 02/01/2022   PHOS  4.0 12/15/2022    Currently prescribed cholecalciferol outpatient.  Will continue to monitor bone minerals during this admission.  5.  Hypertension with chronic kidney disease.  Home regimen includes carvedilol, isosorbide, and amlodipine.  Currently receiving carvedilol and isosorbide.  Diltiazem ordered for rate control.   LOS: 1   10/15/20242:33 PM

## 2023-04-17 NOTE — Consult Note (Addendum)
Pharmacy Antibiotic Note  Alexis Tucker is a 82 y.o. female admitted on 04/16/2023 with MRSE bacteremia. They presented to the ED with weakness, lethargy and fever. Admitted for concerns of sepsis. 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 Wheel-chair bound with hx dementia. Notable has right upper extremity fistula, right subclavian line as possible sources for infection. Patient is on HD MWF. Pharmacy has been consulted for Vancomycin dosing.   Plan: HD on MWF - missed session on M 10/14 and received today T 10/15 LD: Vancomycin 1500 mg after HD With HD MWF give Vancomycin 750 mg  Patient is also on ceftriaxone 2g IV Q24H  and Flagyl 500 mg IV Q12H for concerns of urinary or wound source of infection Continue to follow cultures and assess for source control   Weight: 65.6 kg (144 lb 10 oz)  Temp (24hrs), Avg:98.7 F (37.1 C), Min:98 F (36.7 C), Max:99.8 F (37.7 C)  Recent Labs  Lab 04/16/23 1147 04/16/23 1539 04/16/23 1837 04/17/23 0837  WBC 7.6  --   --  8.2  CREATININE 4.98*  --   --  5.21*  LATICACIDVEN  --  1.1 1.5  --     Estimated Creatinine Clearance: 7.8 mL/min (A) (by C-G formula based on SCr of 5.21 mg/dL (H)).    Allergies  Allergen Reactions   Tramadol Other (See Comments)    nightmares   Codeine Anxiety   Sulfa Antibiotics Itching and Rash   Antimicrobials this admission: 10/14 Azithromycin 500 mg IV Q24H >> 10/15 10/14 Ceftriaxone 2g IV Q24H >>  10/15 Metronidazole 500mg  IV Q12H >>   Dose adjustments this admission: NA  Microbiology results: 10/14 BCx: MRSE 10/15 BCx: in process 10/15 UCx: in process   Thank you for allowing pharmacy to be a part of this patient's care.  Effie Shy, PharmD Pharmacy Resident  04/17/2023 1:15 PM

## 2023-04-17 NOTE — Consult Note (Incomplete)
NAME:  Alexis Tucker, MRN:  [id number], DOB:  [birth year], LOS: 1 ADMISSION DATE:  04/16/2023, CONSULTATION DATE:  04/17/23 REFERRING MD:  Dr. Para March, CHIEF COMPLAINT: Weakness and fatigue    History of Present Illness:  82 year old female presenting to [facility] ED from home via EMS on 04/16/2023 for evaluation of weakness and fatigue.  History provided per chart review as patient unable to participate in bedside interview, no family present at this time*** Daughter reported upon arrival that she noticed the patient began feeling more weak than normal starting 04/14/2023.  Patient's baseline is she is able to ambulate with the use of a walker short distances but otherwise uses a wheelchair.  On 04/15/2023 the patient was too weak to use a walker and on 04/16/2023 she became so weak she could barely sit up in bed.  Daughter reported a low-grade temperature of 100.5 and she administered 2 Tylenol prior to EMS arrival.  Daughter denied symptoms such as cough, congestion, vomiting or diarrhea.  Patient does create a small amount of urine each day per daughter but denies any dysuria.  Patient missed dialysis on 04/09/2023 due to complaints of nausea without vomiting.  Patient is also being followed by podiatry outpatient for dry gangrene of B great toes with no frank infection but at high risk for needing amputation.  ED course: Upon arrival patient calm cooperative and pleasant reporting she has no complaints.  Vital stable with mild tachycardia.  Labs reassuring with chest x-ray negative for cardiopulmonary abnormality.  Concern for infected right subclavian catheter.  Sepsis workup initiated and TRH consulted for admission. Medications given: Rocephin Initial Vitals: 98, 18, 109, 118/78 & 96% on room air  Hospital course: See significant Hospital events for specific breakdown Patient admitted for treatment of possible line infection, nephrology consulted due to hemodialysis needs.  UA suggestive of  infection.  Vascular and podiatry were consulted due to necrotic toes. Vascular removed temporary vascath due to concern it is the source of infection.  In hemodialysis rapid response was called as the patient developed RVR in the setting of known A-fib and was given 5 mg of Cardizem & a 500 mL bolus. iHD was stopped after no fluid removal and patient returned to her inpatient room. At that time nursing administered cardiac medications including carvedilol & Imdur, but the patient vomited directly after. She was discovered to be hypotensive and transferred to SDU for monitoring.  Overnight after additional 1.5 L of IVF and midodrine patient remained hypotensive, PCCM consulted for peripheral vasopressor support.  Most Recent Significant labs: (Labs/ Imaging personally reviewed) I, Cheryll Cockayne Rust-Chester, AGACNP-BC, personally viewed and interpreted this ECG. EKG Interpretation: Date: 04/17/23, EKG Time: 11:01, Rate: 152, Rhythm: A-fib RVR, QRS Axis: normal, Intervals: A-fib, ST/T Wave abnormalities: none, Narrative Interpretation: A-fib with RVR Chemistry: Na+:133, K+: 3.2, BUN/Cr.: 31/ 5.21, Serum CO2/ AG: 26/7, albumin: 2.7 Hematology: WBC: 8.2, Hgb: 9.2, plt: 125 Lactic/ PCT: 1.1 > 1.5/ 3.25 > 5.89, COVID-19 & Influenza A/B: negative  ABG: 7.52/ 35/ 321/ 28.6 CXR 04/16/23: under inflation. No acute cardiopulmonary disease.  PCCM consulted for assistance in management due to circulatory shock requiring peripheral vasopressor support.  Pertinent  Medical History  CVA Dementia Atrial fibrillation on Eliquis Anemia of chronic disease Left-sided breast cancer Type 2 diabetes mellitus End-stage renal disease on iHD (MWF) Hypertension Bilateral cataracts Colon cancer status post colectomy HFpEF CAD Hypothyroidism Hepatitis A Macular degeneration Osteoarthritis Thrombocytopenia Significant Hospital Events: Including procedures, antibiotic start and stop dates in addition  to other  pertinent events   04/16/2023: Admit to inpatient by [facility] workup started possible central line infection from vas cath.  Nephrology consulted for hemodialysis requirements 04/17/2023: In hemodialysis rapid response was called as the patient developed RVR in the setting of known A-fib given dilt bolus with resolution. Later requiring transfer to SDU for hypotension. Palliative, podiatry and vascular also consulted. Temporary vascath removed. BCx + for GPC  Interim History / Subjective:  Patient lethargic, won't open eyes or answer questions but moving purposefully wrapping herself in blankets and groaning when repositioned.  Objective   Blood pressure (!) 77/43, pulse 67, temperature 98.3 F (36.8 C), temperature source Axillary, resp. rate (!) 6, weight 65.6 kg, SpO2 100%.        Intake/Output Summary (Last 24 hours) at 04/17/2023 2245 Last data filed at 04/17/2023 2233 Gross per 24 hour  Intake 2623.68 ml  Output 200 ml  Net 2423.68 ml   Filed Weights   04/17/23 0824  Weight: 65.6 kg    Examination: General: Adult female, acutely ill, lying in bed, NAD HEENT: MM pink/moist, anicteric, atraumatic, neck supple Neuro: Lethargic, unable to follow commands, purposeful movements, PERRL +3, MAE CV: s1s2 irregular, controlled A-fib on monitor, no r/m/g Pulm: Regular, non labored on RA, breath sounds diminished throughout GI: soft, rounded, non tender, bs x 4 Skin: bilateral "B" toes necrotic  Extremities: warm/dry, pulses: Doppler pedal pulses, +1 edema noted BLE  Resolved Hospital Problem list    Assessment & Plan:  Sepsis with gram cocci bacteremia secondary to suspected CLABSI from temporary vas-cath vs UTI 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 Circulatory Shock - Supplemental oxygen as needed, to maintain SpO2 > 90% - f/u cultures, trend lactic/ PCT - Daily CBC, monitor WBC/ fever curve - IV antibiotics: Flagyl, vancomycin & ceftriaxone  - conservative IVF hydration due  to ESRD hx - initiate peripheral neo-synephrine, due to A-fib RVR, wean as tolerated to maintain MAP > 60 - continue midodrine TID  ESRD on iHD Line holiday, plans to consider HD temp cath Th 10/17 depending on GOC discussions with family and patient - Strict I/O's: alert provider if UOP < 0.5 mL/kg/hr - Daily BMP, replace electrolytes PRN - Avoid nephrotoxic agents as able, ensure adequate renal perfusion - Nephrology following, appreciate input   Chronic / Paroxysmal Atrial Fibrillation with Rapid Ventricular Response PMHx: HTN, CAD, HLD - hold additional Cardizem as well as cardiac outpatient medications: Carvedilol/ Imdur/amlodipine while the patient requires vasopressor support, consider restarting as patient stabilizes. Clonidine already being held - continue Eliquis - continue Lipitor - continue Midodrine - Continuous cardiac monitoring  Severe PAD with Necrotic Toes Scheduled for an angiogram on 10/18 -Vascular and podiatry following appreciate input -Neurovascular checks Q4  Hypothyroidism -Continue outpatient Synthroid  Type 2 Diabetes Mellitus - Monitor CBG AC, HS - SSI very sensitive dosing - target range while in ICU: 140-180 - follow ICU hyper/hypo-glycemia protocol  Best Practice (right click and "Reselect all SmartList Selections" daily)  Diet/type: Regular consistency (see orders) DVT prophylaxis: DOAC GI prophylaxis: N/A Lines: N/A Foley:  N/A Code Status:  DNR Last date of multidisciplinary goals of care discussion [04/17/23]  Labs   CBC: Recent Labs  Lab 04/16/23 1147 04/17/23 0837  WBC 7.6 8.2  HGB 10.2* 9.2*  HCT 32.6* 29.3*  MCV 92.6 93.3  PLT 129* 125*    Basic Metabolic Panel: Recent Labs  Lab 04/16/23 1147 04/17/23 0837  NA 136 133*  K 3.5 3.2*  CL  97* 100  CO2 27 26  GLUCOSE 128* 99  BUN 26* 31*  CREATININE 4.98* 5.21*  CALCIUM 8.9 8.1*   GFR: Estimated Creatinine Clearance: 7.8 mL/min (A) (by C-G formula based on SCr of  5.21 mg/dL (H)). Recent Labs  Lab 04/16/23 1147 04/16/23 1539 04/16/23 1546 04/16/23 1837 04/17/23 0837 04/17/23 1537 04/17/23 1849  PROCALCITON  --   --  3.25  --  5.89  --   --   WBC 7.6  --   --   --  8.2  --   --   LATICACIDVEN  --  1.1  --  1.5  --  1.6 1.4    Liver Function Tests: Recent Labs  Lab 04/16/23 1147  AST 16  ALT 7  ALKPHOS 68  BILITOT 0.8  PROT 7.0  ALBUMIN 2.7*   No results for input(s): "LIPASE", "AMYLASE" in the last 168 hours. No results for input(s): "AMMONIA" in the last 168 hours.  ABG    Component Value Date/Time   PHART 7.52 (H) 04/17/2023 1006   PCO2ART 35 04/17/2023 1006   PO2ART 321 (H) 04/17/2023 1006   HCO3 28.6 (H) 04/17/2023 1006   TCO2 18 (L) 02/01/2022 1218   O2SAT 99.6 04/17/2023 1006     Coagulation Profile: No results for input(s): "INR", "PROTIME" in the last 168 hours.  Cardiac Enzymes: No results for input(s): "CKTOTAL", "CKMB", "CKMBINDEX", "TROPONINI" in the last 168 hours.  HbA1C: Hgb A1c MFr Bld  Date/Time Value Ref Range Status  09/25/2022 01:28 PM 6.1 4.6 - 6.5 % Final    Comment:    Glycemic Control Guidelines for People with Diabetes:Non Diabetic:  <6%Goal of Therapy: <7%Additional Action Suggested:  >8%   04/23/2022 06:12 AM 5.8 (H) 4.8 - 5.6 % Final    Comment:    (NOTE) Pre diabetes:          5.7%-6.4%  Diabetes:              >6.4%  Glycemic control for   <7.0% adults with diabetes     CBG: Recent Labs  Lab 04/17/23 2210  GLUCAP 186*    Review of Systems:   UTA- patient unable to participate in interview at this time.  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   Surgical History:   Past Surgical History:  Procedure Laterality Date   A/V FISTULAGRAM Right 06/08/2022   Procedure: A/V Fistulagram;  Surgeon: Annice Needy, MD;  Location: [facility] INVASIVE CV LAB;  Service: Cardiovascular;  Laterality: Right;   ABDOMINAL HYSTERECTOMY  1976   partial, after tubal pregnancy, h/o fibroids with heavy bleeding   AV FISTULA PLACEMENT Right 02/01/2022   Procedure: INSERTION OF ARTERIOVENOUS (AV) GORE-TEX GRAFT ARM (LEFT UPPER EXTREMITY);  Surgeon: Annice Needy,  MD;  Location: [facility] ORS;  Service: Vascular;  Laterality: Right;   BREAST BIOPSY Left 2008   positive   BREAST BIOPSY Left 11/22/2017   2oclock 2cmfn wing shaped clip, INVASIVE MAMMARY CARCINOMA   BREAST BIOPSY Left 11/22/2017   2oclock 4cmfn coil shaped clip, INVASIVE MAMMARY CARCINOMA   BREAST BIOPSY Left 11/22/2017   lymph node - butterfly hydroMARK   BREAST EXCISIONAL BIOPSY Left 01/07/2018   lumpectomy by Dr. Lemar Livings   BREAST LUMPECTOMY Left 2008   F/U radiation   BREAST LUMPECTOMY Left 2019   2 areas of [facility] at 2:00 position f/u with mammosite   BREAST LUMPECTOMY WITH SENTINEL LYMPH NODE BIOPSY Left 01/07/2018   Procedure: BREAST LUMPECTOMY WITH SENTINEL LYMPH NODE BX;  Surgeon: Earline Mayotte, MD;  Location: [facility] ORS;   Service: General;  Laterality: Left;   BREAST MAMMOSITE  2008   carotid ultrasound  12/2012   1-39% bilateral stenosis, severe in ECA   CATARACT EXTRACTION W/PHACO Left 08/14/2016   Procedure: CATARACT EXTRACTION PHACO AND INTRAOCULAR LENS PLACEMENT (IOC)  Left Diabetic;  Surgeon: Sherald Hess, MD;  Location: [facility];  Service: Ophthalmology;  Laterality: Left;  Diabetic - oral meds   CHOLECYSTECTOMY  2005   COLECTOMY  2005   colon cancer   COLON SURGERY  2006   bowel obstruction   COLONOSCOPY  2008   ext hem, ileo-colonic anastomosis in cecum, tubular adenoma x1, rec rpt 1 yr for multiple polyps (in [place])   COLONOSCOPY WITH PROPOFOL N/A 10/19/2021   2.5cm TVA with low grade dysplasia, diverticulosis, no rpt recommended Wyline Mood, MD)   DIALYSIS/PERMA CATHETER INSERTION N/A 12/08/2022   Procedure: DIALYSIS/PERMA CATHETER INSERTION;  Surgeon: Renford Dills, MD;  Location: [facility] INVASIVE CV LAB;  Service: Cardiovascular;  Laterality: N/A;   ESOPHAGOGASTRODUODENOSCOPY N/A 05/06/2016   Procedure: ESOPHAGOGASTRODUODENOSCOPY (EGD);  Surgeon: Scot Jun, MD;  Location: [facility] ENDOSCOPY;  Service: Endoscopy;  Laterality: N/A;   EYE SURGERY     JOINT REPLACEMENT     LOWER EXTREMITY ANGIOGRAPHY Left 03/22/2023   Procedure: Lower Extremity Angiography;  Surgeon: Annice Needy, MD;  Location: [facility] INV[facility] CV [facility];  Service: Cardiovascular;  Laterality: Left;   LOWER EXTREMITY ANGIOGRAPHY Right 03/29/2023   Procedure: Lower Extremity Angiography;  Surgeon: Annice Needy, MD;  Location: [facility] IN[facility] CV [facility];  Service: Cardiovascular;  Laterality: Right;   THYROIDECTOMY, PARTIAL     unclear why   TOTAL KNEE ARTHROPLASTY Right 1999   UPPER EXTREMITY ANGIOGRAPHY Right 05/18/2022   Procedure: Upper Extremity Angiography;  Surgeon: Annice Needy, MD;  Location: [facility] INVAS[facility] CV LAB;  Service: Cardiovascular;  Laterality: Right;   US ECHOCARDIOGRAPHY  11/2007   nl LV fxn EF 60%,  diastolic dysfunction, LVH, tr TR and MR, slcerotic aortic valve   VEIN LIGATION AND STRIPPING       Social History:   reports that she has quit smoking. She has been exposed to tobacco smoke. She has never used smokeless tobacco. She reports that she does not drink alcohol and does not use drugs.   Family History:  Her family history includes Alcohol abuse in her father; Arthritis in her father and mother; Diabetes in her mother; Hypertension in her mother; Stroke in her daughter. There is no history of CAD, Cancer, or Breast cancer.   Allergies Allergies  Allergen Reactions   Tramadol Other (See Comments)    nightmares   Codeine Anxiety   Sulfa Antibiotics Itching and Rash  Home Medications  Prior to Admission medications   Medication Sig Start Date End Date Taking? Authorizing Provider  allopurinol (ZYLOPRIM) 100 MG tablet Take 1 tablet (100 mg total) by mouth daily. For gout 10/03/22  Yes Eustaquio Boyden, MD  amLODipine (NORVASC) 2.5 MG tablet Take 2.5 mg by mouth daily. 04/10/23  Yes [provider]  anastrozole (ARIMIDEX) 1 MG tablet TAKE 1 TABLET BY MOUTH EVERY DAY 11/13/22  Yes Earna Coder, MD  apixaban (ELIQUIS) 2.5 MG TABS tablet Take 1 tablet (2.5 mg total) by mouth 2 (two) times daily. As blood thinner 02/13/23  Yes Eustaquio Boyden, MD  aspirin EC 81 MG tablet Take 1 tablet (81 mg total) by mouth daily. Swallow whole. 03/22/23 03/21/24 Yes Dew, Marlow Baars, MD  atorvastatin (LIPITOR) 10 MG tablet Take 1 tablet (10 mg total) by mouth daily. 03/22/23 03/21/24 Yes Dew, Marlow Baars, MD  carvedilol (COREG) 12.5 MG tablet Take 12.5 mg by mouth 2 (two) times daily. 04/15/23  Yes [provider]  Cholecalciferol (VITAMIN D-3) 5000 UNITS TABS Take 1 tablet by mouth daily.   Yes [provider]  cloNIDine HCl (KAPVAY) 0.1 MG TB12 ER tablet Take 0.1 mg by mouth 2 (two) times daily. PRN   Yes [provider]  isosorbide mononitrate (IMDUR) 30 MG 24  hr tablet Take 1 tablet (30 mg total) by mouth daily. 11/01/22 03/12/24 Yes Agbor-Etang, Arlys John, MD  levothyroxine (SYNTHROID) 88 MCG tablet Take 1 tablet (88 mcg total) by mouth daily. For hypothyroidism 02/13/23  Yes Eustaquio Boyden, MD  linagliptin (TRADJENTA) 5 MG TABS tablet Take 1 tablet (5 mg total) by mouth daily. 02/13/23  Yes Eustaquio Boyden, MD  pantoprazole (PROTONIX) 40 MG tablet Take 1 tablet (40 mg total) by mouth daily as needed. For reflux 10/03/22  Yes Eustaquio Boyden, MD  sertraline (ZOLOFT) 25 MG tablet Take 1 tablet (25 mg total) by mouth daily. 02/13/23  Yes Eustaquio Boyden, MD     Critical care time: [address] minutes       Betsey Holiday, AGACNP-BC Acute Care Nurse Practitioner [facility]   [phone] / [phone] Please see Amion for details.

## 2023-04-17 NOTE — TOC Initial Note (Signed)
Transition of Care [facility]) - Initial/Assessment Note    Patient Details  Name: Alexis Tucker MRN: [id number] Date of Birth: 1941-02-02  [facility]) CM/SW Contact:    Margarito Liner, LCSW Phone Number: 04/17/2023, 10:04 AM  Clinical Narrative:  Readmission prevention screen complete. Received call back from daughter. CSW introduced role and explained that discharge planning would be discussed. PCP is Eustaquio Boyden, MD. Daughter drives her to appointments. [facility] [place]. No issues obtaining medications although daughter reports she is in the donut hole right now. Patient lives home with daughter. No home health prior to admission. She was recently active with [facility] but she had her discharged from the services because patient's pain level was too high to work with them. Patient [facility] to [facility] 6[facility]14-8/2 (49 days). CSW asked MD to consult PT and OT when appropriate. Patient has a RW and wheelchair at home as well as a built-in shower seat. No further concerns. CSW encouraged patient's daughter to contact CSW as needed. CSW will continue to follow patient and her daughter for support and facilitate return home once stable.                Expected Discharge Plan:  (TBD) Barriers to Discharge: Continued Medical Work up   Patient Goals and CMS Choice Patient states their goals for this hospitalization and ongoing recovery are:: Patient not fully oriented.          Expected Discharge Plan and Services     Post Acute Care Choice:  (TBD) Living arrangements for the past 2 months: Single Family Home                                      Prior Living Arrangements/Services Living arrangements for the past 2 months: Single Family Home Lives with:: Adult Children Patient language and need for interpreter reviewed:: Yes Do you feel safe going back to the place where you live?: Yes      Need for Family Participation in Patient Care: Yes  (Comment) Care giver support system in place?: Yes (comment) Current home services: DME Criminal Activity/Legal Involvement Pertinent to Current Situation/Hospitalization: No - Comment as needed  Activities of Daily Living      Permission Sought/Granted Permission sought to share information with : Family Supports    Share Information with NAME: Alexis Tucker     Permission granted to share info w Relationship: Daughter  Permission granted to share info w Contact Information: [phone]  Emotional Assessment   Attitude/Demeanor/Rapport: Unable to Assess Affect (typically observed): Unable to Assess Orientation: : Oriented to Self Alcohol / Substance Use: Not Applicable Psych Involvement: No (comment)  Admission diagnosis:  Altered mental status [R41.82] Weakness [R53.1] Fever, unspecified fever cause [R50.9] Patient Active Problem List   Diagnosis Date Noted   Altered mental status 04/16/2023   Protein-calorie malnutrition, severe (HCC) 04/16/2023   Atherosclerosis of native arteries of the extremities with gangrene (HCC) 03/13/2023   Hepatitis B core antibody positive 02/14/2023   Hyperlipidemia associated with type 2 diabetes mellitus (HCC) 02/14/2023   Depression, major, single episode, moderate (HCC) 02/14/2023   ESRD (end stage renal disease) on dialysis (HCC) 12/10/2022   Anemia of chronic disease 12/10/2022   Acute on chronic diastolic CHF (congestive heart failure) (HCC) 12/08/2022   Multifocal pneumonia 12/06/2022   DNR (do not resuscitate) 12/03/2022   Glenohumeral arthritis, left 10/14/2022  Rotator cuff disorder, left 10/14/2022   Left arm weakness 10/13/2022   Atypical chest pain 10/13/2022   Memory impairment 10/09/2022   Bilateral hearing loss due to cerumen impaction 09/25/2022   Pruritus 09/25/2022   Decreased hearing of both ears 09/25/2022   Iron deficiency 02/20/2022   Arteriovenous graft for hemodialysis in place, primary 02/09/2022   General  weakness 11/26/2021   Unintentional weight loss 09/19/2021   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   Primary open angle glaucoma (POAG) of both eyes 06/19/2020   Malignant neoplasm of upper-outer quadrant of left breast in female, estrogen receptor positive (HCC) 12/03/2017   Osteoporosis 09/29/2017   History of arterial ischemic stroke 08/27/2017   Thrombocytopenia (HCC) 08/27/2017   Paroxysmal atrial fibrillation (HCC)    Elevated troponin 05/04/2016   Assistance needed with transportation 07/31/2015   Advanced care planning/counseling discussion 07/27/2015   Lower back pain 03/14/2014   Macular degeneration    Diabetic retinopathy with macular edema (HCC) 03/03/2014   Medicare annual wellness visit, subsequent 01/07/2013   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) 01/06/2013   Carotid stenosis, asymptomatic 12/05/2012   Post-surgical hypothyroidism    OA (osteoarthritis)    Type 2 diabetes mellitus with other specified complication (HCC)    HTN (hypertension)    History of breast cancer    History of colon cancer    GERD (gastroesophageal reflux disease)    PCP:  Eustaquio Boyden, MD [phone] [address] [place] - [address] [place] [address] Phone: [phone] Fax: [phone]     Social Determinants of Health (SDOH) Social History: SDOH Screenings   Food Insecurity: No Food Insecurity (02/13/2023)  Housing: Low Risk  (02/13/2023)  Transportation Needs: No Transportation Needs (02/13/2023)  Utilities: Not At Risk (02/13/2023)  Alcohol Screen: Low Risk  (02/13/2023)  Depression (PHQ2-9): Low Risk  (02/13/2023)  Financial Resource Strain: Low Risk  (02/13/2023)  Physical Activity: Inactive (02/13/2023)  Social Connections: Socially Isolated (02/13/2023)  Stress: No Stress Concern Present (02/13/2023)  Tobacco Use: Medium Risk (04/16/2023)  Health Literacy: Adequate Health Literacy  (02/13/2023)   SDOH Interventions:     Readmission Risk Interventions    04/17/2023   10:00 AM 12/07/2022   12:55 PM  Readmission Risk Prevention Plan  Transportation Screening Complete Complete  PCP or Specialist Appt within 3-5 Days  Complete  HRI or Home Care Consult  Complete  Social Work Consult for Recovery Care Planning/Counseling  Complete  Palliative Care Screening  Not Applicable  Medication Review Oceanographer) Complete Complete  PCP or Specialist appointment within 3-5 days of discharge Complete   SW Recovery Care/Counseling Consult Complete   Palliative Care Screening Not Applicable   Skilled Nursing Facility Not Applicable

## 2023-04-17 NOTE — Procedures (Signed)
Hospital Consult    Reason for Consult:  Perma Catheter Removal Requesting Physician:  Dr Jonah Blue MD MRN #:  [id number]  History of Present Illness: This is a 82 y.o. female  admitted on 04/16/2023 with MRSE bacteremia. They presented to the ED with weakness, lethargy and fever. Admitted for concerns of sepsis. 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 Wheel-chair bound with hx dementia. Notable has right upper extremity fistula, right subclavian line as possible sources for infection. Patient is on HD MWF. Vascular Surgery contacted to remove Perma Catheter.   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   a.) Avastin injections as [facility]   DJD (degenerative joint disease)    ESRD (end stage renal disease) (HCC)    GERD (gastroesophageal reflux disease)    Glaucoma 03/2014   History of colon cancer 2006   a.) s/p colectomy   History of hepatitis A 1990s   from food, resolved   HTN (hypertension)    Hyperplastic colon polyp    Long term current use of anticoagulant    a.) apixaban   Long term current use of antithrombotics/antiplatelets    a.) pentoxifylline   Macular degeneration    wet R with vision loss, dry L; to see retina specialist   Neuropathy    OA (osteoarthritis)    Osteonecrosis (HCC)    hips?   PONV (postoperative nausea and vomiting)    Post-surgical hypothyroidism    Thrombocytopenia (HCC)    Tubular adenoma    Wears dentures    partial upper    Past Surgical History:  Procedure Laterality Date   A/V FISTULAGRAM Right 06/08/2022   Procedure: A/V Fistulagram;  Surgeon: Annice Needy, MD;  Location: [facility];  Service: Cardiovascular;  Laterality: Right;   ABDOMINAL HYSTERECTOMY  1976   partial, after tubal pregnancy, h/o fibroids with heavy bleeding   AV FISTULA PLACEMENT Right 02/01/2022   Procedure: INSERTION OF ARTERIOVENOUS (AV) GORE-TEX GRAFT ARM (LEFT UPPER EXTREMITY);  Surgeon: Annice Needy, MD;  Location: [facility] ORS;  Service: Vascular;  Laterality: Right;  BREAST BIOPSY Left 2008   positive   BREAST BIOPSY Left 11/22/2017   2oclock 2cmfn wing shaped clip, INVASIVE MAMMARY CARCINOMA   BREAST BIOPSY Left 11/22/2017   2oclock 4cmfn coil shaped clip, INVASIVE MAMMARY CARCINOMA   BREAST BIOPSY Left 11/22/2017   lymph node - butterfly hydroMARK   BREAST EXCISIONAL BIOPSY Left 01/07/2018   lumpectomy by Dr. Lemar Livings   BREAST LUMPECTOMY Left 2008   F/U radiation   BREAST LUMPECTOMY Left 2019   2 areas of [facility] at 2:00 position f/u with mammosite   BREAST LUMPECTOMY  WITH SENTINEL LYMPH NODE BIOPSY Left 01/07/2018   Procedure: BREAST LUMPECTOMY WITH SENTINEL LYMPH NODE BX;  Surgeon: Earline Mayotte, MD;  Location: [facility] ORS;  Service: General;  Laterality: Left;   BREAST MAMMOSITE  2008   carotid ultrasound  12/2012   1-39% bilateral stenosis, severe in ECA   CATARACT EXTRACTION W/PHACO Left 08/14/2016   Procedure: CATARACT EXTRACTION PHACO AND INTRAOCULAR LENS PLACEMENT (IOC)  Left Diabetic;  Surgeon: Sherald Hess, MD;  Location: [facility];  Service: Ophthalmology;  Laterality: Left;  Diabetic - oral meds   CHOLECYSTECTOMY  2005   COLECTOMY  2005   colon cancer   COLON SURGERY  2006   bowel obstruction   COLONOSCOPY  2008   ext hem, ileo-colonic anastomosis in cecum, tubular adenoma x1, rec rpt 1 yr for multiple polyps (in [place])   COLONOSCOPY WITH PROPOFOL N/A 10/19/2021   2.5cm TVA with low grade dysplasia, diverticulosis, no rpt recommended Wyline Mood, MD)   DIALYSIS/PERMA CATHETER INSERTION N/A 12/08/2022   Procedure: DIALYSIS/PERMA CATHETER INSERTION;  Surgeon: Renford Dills, MD;  Location: [facility];  Service: Cardiovascular;  Laterality: N/A;   ESOPHAGOGASTRODUODENOSCOPY N/A 05/06/2016   Procedure: ESOPHAGOGASTRODUODENOSCOPY (EGD);  Surgeon: Scot Jun, MD;  Location: [facility];  Service: Endoscopy;  Laterality: N/A;   EYE SURGERY     JOINT REPLACEMENT     LOWER EXTREMITY ANGIOGRAPHY Left 03/22/2023   Procedure: Lower Extremity Angiography;  Surgeon: Annice Needy, MD;  Location: [facility];  Service: Cardiovascular;  Laterality: Left;   LOWER EXTREMITY ANGIOGRAPHY Right 03/29/2023   Procedure: Lower Extremity Angiography;  Surgeon: Annice Needy, MD;  Location: [facility];  Service: Cardiovascular;  Laterality: Right;   THYROIDECTOMY, PARTIAL     unclear why   TOTAL KNEE ARTHROPLASTY Right 1999   UPPER EXTREMITY ANGIOGRAPHY Right 05/18/2022   Procedure: Upper Extremity  Angiography;  Surgeon: Annice Needy, MD;  Location: [facility];  Service: Cardiovascular;  Laterality: Right;   US ECHOCARDIOGRAPHY  11/2007   nl LV fxn EF 60%, diastolic dysfunction, LVH, tr TR and MR, slcerotic aortic valve   VEIN LIGATION AND STRIPPING      Allergies  Allergen Reactions   Tramadol Other (See Comments)    nightmares   Codeine Anxiety   Sulfa Antibiotics Itching and Rash    Prior to Admission medications   Medication Sig Start Date End Date Taking? Authorizing Provider  allopurinol (ZYLOPRIM) 100 MG tablet Take 1 tablet (100 mg total) by mouth daily. For gout 10/03/22  Yes Eustaquio Boyden, MD  amLODipine (NORVASC) 2.5 MG tablet Take 2.5 mg by mouth daily. 04/10/23  Yes [provider]  anastrozole (ARIMIDEX) 1 MG tablet TAKE 1 TABLET BY MOUTH EVERY DAY 11/13/22  Yes Earna Coder, MD  apixaban (ELIQUIS) 2.5 MG TABS tablet Take 1 tablet (2.5 mg total) by mouth 2 (two)  times daily. As blood thinner 02/13/23  Yes Eustaquio Boyden, MD  aspirin EC 81 MG tablet Take 1 tablet (81 mg total) by mouth daily. Swallow whole. 03/22/23 03/21/24 Yes Dew, Marlow Baars, MD  atorvastatin (LIPITOR) 10 MG tablet Take 1 tablet (10 mg total) by mouth daily. 03/22/23 03/21/24 Yes Dew, Marlow Baars, MD  carvedilol (COREG) 12.5 MG tablet Take 12.5 mg by mouth 2 (two) times daily. 04/15/23  Yes [provider]  Cholecalciferol (VITAMIN D-3) 5000 UNITS TABS Take 1 tablet by mouth daily.   Yes [provider]  cloNIDine HCl (KAPVAY) 0.1 MG TB12 ER tablet Take 0.1 mg by mouth 2 (two) times daily. PRN   Yes [provider]  isosorbide mononitrate (IMDUR) 30 MG 24 hr tablet Take 1 tablet (30 mg total) by mouth daily. 11/01/22 03/12/24 Yes Agbor-Etang, Arlys John, MD  levothyroxine (SYNTHROID) 88 MCG tablet Take 1 tablet (88 mcg total) by mouth daily. For hypothyroidism 02/13/23  Yes Eustaquio Boyden, MD  linagliptin (TRADJENTA) 5 MG TABS tablet Take 1 tablet (5 mg total)  by mouth daily. 02/13/23  Yes Eustaquio Boyden, MD  pantoprazole (PROTONIX) 40 MG tablet Take 1 tablet (40 mg total) by mouth daily as needed. For reflux 10/03/22  Yes Eustaquio Boyden, MD  sertraline (ZOLOFT) 25 MG tablet Take 1 tablet (25 mg total) by mouth daily. 02/13/23  Yes Eustaquio Boyden, MD    Social History   Socioeconomic History   Marital status: Divorced    Spouse name: Not on file   Number of children: 1   Years of education: Not on file   Highest education level: Not on file  Occupational History   Not on file  Tobacco Use   Smoking status: Former    Passive exposure: Past   Smokeless tobacco: Never   Tobacco comments:    quit over 40 yrs ago  Vaping Use   Vaping status: Never Used  Substance and Sexual Activity   Alcohol use: Never    Comment: rare   Drug use: Never   Sexual activity: Not Currently  Other Topics Concern   Not on file  Social History Narrative   Lives alone,   Granddaughter in [place] (attorney).   Divorced   Occu: retired, worked in Consulting civil engineer   Edu: college   Has 1 living daughter. (One passed)   Social Determinants of Health   Financial Resource Strain: Low Risk  (02/13/2023)   Overall Financial Resource Strain (CARDIA)    Difficulty of Paying Living Expenses: Not hard at all  Food Insecurity: No Food Insecurity (02/13/2023)   Hunger Vital Sign    Worried About Running Out of Food in the Last Year: Never true    Ran Out of Food in the Last Year: Never true  Transportation Needs: No Transportation Needs (02/13/2023)   PRAPARE - Administrator, Civil Service (Medical): No    Lack of Transportation (Non-Medical): No  Physical Activity: Inactive (02/13/2023)   Exercise Vital Sign    Days of Exercise per Week: 0 days    Minutes of Exercise per Session: 0 min  Stress: No Stress Concern Present (02/13/2023)   [employer] of Occupational Health - Occupational Stress Questionnaire    Feeling of Stress : Not at all  Social Connections:  Socially Isolated (02/13/2023)   Social Connection and Isolation Panel [NHANES]    Frequency of Communication with Friends and Family: More than three times a week    Frequency of Social Gatherings with  Friends and Family: More than three times a week    Attends Religious Services: Never    Database administrator or Organizations: No    Attends Banker Meetings: Never    Marital Status: Divorced  Catering manager Violence: Not At Risk (02/13/2023)   Humiliation, Afraid, Rape, and Kick questionnaire    Fear of Current or Ex-Partner: No    Emotionally Abused: No    Physically Abused: No    Sexually Abused: No     Family History  Problem Relation Age of Onset   Diabetes Mother    Hypertension Mother    Arthritis Mother    Alcohol abuse Father    Arthritis Father    Stroke Daughter    CAD Neg Hx    Cancer Neg Hx    Breast cancer Neg Hx     ROS: Otherwise negative unless mentioned in HPI  Physical Examination  Vitals:   04/17/23 1057 04/17/23 1128  BP: (!) 120/104 138/86  Pulse:    Resp: 20   Temp: 99.8 F (37.7 C)   SpO2: 98%    Body mass index is 25.62 kg/m.  General:  WDWN in NAD Gait: Not observed HENT: WNL, normocephalic Pulmonary: normal non-labored breathing, without Rales, rhonchi,  wheezing Cardiac: regular, without  Murmurs, rubs or gallops; without carotid bruits Abdomen: Positive bowel sounds, soft, NT/ND, no masses Skin: without rashes Vascular Exam/Pulses: palpable pulses except bilateral lower DP/PT Extremities: with ischemic changes, with Gangrene , without cellulitis; without open wounds;  Musculoskeletal: no muscle wasting or atrophy  Neurologic: A&O X 3;  No focal weakness or paresthesias are detected; speech is fluent/normal Psychiatric:  The pt has Normal affect. Lymph:  Unremarkable  CBC    Component Value Date/Time   WBC 8.2 04/17/2023 0837   RBC 3.14 (L) 04/17/2023 0837   HGB 9.2 (L) 04/17/2023 0837   HGB 10.6 02/26/2014  0000   HCT 29.3 (L) 04/17/2023 0837   HCT 35.8 10/16/2012 1227   PLT 125 (L) 04/17/2023 0837   PLT 132 (L) 10/16/2012 1227   MCV 93.3 04/17/2023 0837   MCV 91 10/16/2012 1227   MCV 89.0 01/11/2012 0000   MCH 29.3 04/17/2023 0837   MCHC 31.4 04/17/2023 0837   RDW 15.0 04/17/2023 0837   RDW 14.0 10/16/2012 1227   LYMPHSABS 0.7 12/05/2022 0629   MONOABS 0.7 12/05/2022 0629   EOSABS 0.1 12/05/2022 0629   BASOSABS 0.0 12/05/2022 0629    BMET    Component Value Date/Time   NA 133 (L) 04/17/2023 0837   NA 140 10/16/2012 1227   K 3.2 (L) 04/17/2023 0837   K 4.3 02/26/2014 0000   CL 100 04/17/2023 0837   CL 106 10/16/2012 1227   CO2 26 04/17/2023 0837   CO2 24 10/16/2012 1227   GLUCOSE 99 04/17/2023 0837   GLUCOSE 262 (H) 10/16/2012 1227   BUN 31 (H) 04/17/2023 0837   BUN 22 (H) 10/16/2012 1227   CREATININE 5.21 (H) 04/17/2023 0837   CREATININE 4.05 02/26/2014 0000   CALCIUM 8.1 (L) 04/17/2023 0837   CALCIUM 9.0 10/16/2012 1227   GFRNONAA 8 (L) 04/17/2023 0837   GFRAA 10 (L) 01/07/2018 0859    COAGS: Lab Results  Component Value Date   INR 2.6 (H) 12/03/2022   INR 1.03 12/28/2017     Non-Invasive Vascular Imaging:   None  Statin:  Yes.   Beta Blocker:  Yes.   Aspirin:  Yes.   ACEI:  No. ARB:  Yes.   CCB use:  No Other antiplatelets/anticoagulants:  Yes.   Eliquis 2.5 mg BID   ASSESSMENT/PLAN: This is a 82 y.o. female with positive blood cultures after being admitted with weakness, lethargy and fever.   I discussed in detail with the patient the procedure, benefits, risks and complications. Patient verbalized her understanding. Wishes to proceed.   -I discussed in detail the procedure with Dr Vilinda Flake MD and he agrees with the plan.    Marcie Bal [facility] 04/17/2023 2:05 PM

## 2023-04-17 NOTE — Progress Notes (Signed)
Rapid response called for pt due to low 02 level and desaturating with non-rebreather. Dr. Thedore Mins aware and blood cultures ordered. Hr elevated at 144.

## 2023-04-17 NOTE — Progress Notes (Signed)
Progress Note   Patient: Alexis Tucker ZOX:[id number] DOB: 1941-02-12 DOA: 04/16/2023     1 DOS: the patient was seen and examined on 04/17/2023   Brief hospital course: 82yo with history of hypertension, hypothyroid, ESRD on MWF HD, hypothyroidism, PAF, and breast cancer who presented on 10/14 with weakness/lethargy and fever.  COVID/flu/RSV negative.  Concern for infectious etiology, given Ceftriaxone and Azithromycin.  Was in HD today and RRT was called, developed RVR in the setting of known afib.  Started on Cardizem drip and transferred to progressive care.  UA is suggestive of infection and she also has 2 necrotic toes.  Assessment and Plan:  Sepsis with bacteremia Patient presented with weakness, lethargy, T100.5 Most likely sources appear to be UTI (abnormal UA, culture pending, could be related to urinary stasis in the setting of ESRD but she does make urine about twice a day); necrotic toes (no frank evidence of infection but she has advanced PVD with gangrene, will do ABIs and have podiatry and vascular surgery see her); or catheter infection (will dc perm cath and give line holiday until Thursday) Blood and urine cultures pending Elevated procalcitonin Will admit due to: hemodynamic instability - has developed RVR from infection Treat with IV Rocephin/Vanc/Flagyl for undifferentiated sepsis  BCID with methicillin resistant coag negative staph - while this may be a contaminant, she is at serious risk for infection and this must currently be considered legitimate  Afib with RVR Known baseline afib Rate controlled with carvedilol Went into RVR in HD this AM, not improved with 2 doses of IV diltiazem Started on dilt drip and transferred to progressive care This is thought to be related to underlying infection Continue Eliquis - will need to hold for 24 hours before angiogram, which is planned for Friday   ESRD (end stage renal disease) on dialysis  Patient on chronic TTS  HD Nephrology prn order set utilized Nephrology is consulting She underwent attempted HD today and developed RVR and the session was terminated Will remove perm cath and do line holiday Plan to place temp cath on Thursday with HD to follow  PAD, necrotic toes Patient has dry gangrene of B great toes Successful angioplasty/stent placement on 9/19 Unsuccessful repeat procedure on 9/26 Saw podiatry for this issue on 10/8, no frank infection but at high risk for needing amputation Podiatry and vascular surgery are both asked to consult She is scheduled for angiogram on 10/18 Continue Imdur   HTN (hypertension) Carvedilol continued on admission; may need to be held if BP will not tolerate in the setting of RVR Hold amlodipine, likely need to stop to transition to Dilt Currently on diltiazem drip   Hypothyroidism Continue Synthroid   GERD (gastroesophageal reflux disease) Home PPI resumed   Protein-calorie malnutrition, severe (HCC) Protein max boost between meals and at bedtime ordered Encourage patient to eat at bedside Nutrition consulted   Anemia of chronic disease Appears to be stable at this time, will follow  DM A1c was 6.1 in 09/2022 Hold Tradjenta Cover with very sensitive-scale SSI   HLD Continue atorvastatin  History of breast cancer Home anastrozole 1 mg daily resumed   Goals of care DNR confirmed with daughter at bedside on admission and this AM Her daughter thinks that she would be willing to proceed with amputation of great toes but is uncertain if she would be willing to proceed with major amputation Palliative care consulted for ongoing assistance with GOC     Consultants: Nephrology Vascular surgery Podiatry Palliative  care Nutrition  Procedures: Perm cath removal 10/15 Angiogram tentatively 10/18  Antibiotics: Ceftriaxone 10/14- Azithromycin 10/14-15 Vancomycin 10/15- Flagyl 10/15-   30 Day Unplanned Readmission Risk Score     Flowsheet Row ED to Hosp-Admission (Current) from [facility]/16/2023 in [facility]  30 Day Unplanned Readmission Risk Score (%) 31.04 Filed at 04/17/2023 0400       This score is the patient's risk of an unplanned readmission within 30 days of being discharged (0 -100%). The score is based on dignosis, age, lab data, medications, orders, and past utilization.   Low:  0-14.9   Medium: 15-21.9   High: 22-29.9   Extreme: 30 and above           Subjective: Patient is able to understand events of today and ask questions but did not provide a good history.  She does acknowledge making urine but says that it is hard.  I spoke with her daughter - she makes urine a couple of times a day.  Her toes started mid-August with neuropathy, had angiogram on the left leg in August with stents placed, unable to place on the R due to uncompressible arteries.  Podiatry saw her last week, has appointment for ABI on 10/29.  Also with perm cath which could be source of infection.     Objective: Vitals:   04/17/23 1057 04/17/23 1128  BP: (!) 120/104 138/86  Pulse:    Resp: 20   Temp: 99.8 F (37.7 C)   SpO2: 98%     Intake/Output Summary (Last 24 hours) at 04/17/2023 1444 Last data filed at 04/17/2023 0540 Gross per 24 hour  Intake 400 ml  Output 100 ml  Net 300 ml   Filed Weights   04/17/23 0824  Weight: 65.6 kg    Exam:  General:  Appears chronically ill, frail Eyes:  EOMI, normal lids, iris ENT:  grossly normal hearing, lips & tongue, mmm; some absent/poor dentition Neck:  no LAD, masses or thyromegaly Cardiovascular:  Irregularly irregular with tachycardia to 140s. 1+ LE edema.  Respiratory:   CTA bilaterally with no wheezes/rales/rhonchi.  Normal respiratory effort. Abdomen:  soft, NT, ND Skin:  B great toes with distal necrosis, foul smelling, no frank evidence of infection    Musculoskeletal:  generally poor tone BUE/BLE, no bony abnormality  other than as above Psychiatric: blunted mood and affect, speech sparse and generally appropriate Neurologic:  CN 2-12 grossly intact  Data Reviewed: I have reviewed the patient's lab results since admission.  Pertinent labs for today include:  Labs pending UA: 50 glucose, moderate Hgb, large LE, 100 protein, many bacteria Blood and urine cultures pending CXR unremarkable   Family Communication: None present; I spoke with her daughter by telephone  Disposition: Status is: Inpatient Remains inpatient appropriate because: very ill       Total critical care time: 50 minutes Critical care time was exclusive of separately billable procedures and treating other patients. Critical care was necessary to treat or prevent imminent or life-threatening deterioration. Critical care was time spent personally by me on the following activities: development of treatment plan with patient and/or surrogate as well as nursing, discussions with consultants, evaluation of patient's response to treatment, examination of patient, obtaining history from patient or surrogate, ordering and performing treatments and interventions, ordering and review of laboratory studies, ordering and review of radiographic studies, pulse oximetry and re-evaluation of patient's condition.   Unresulted Labs (From admission, onward)     Start  Ordered   04/18/23 0500  CBC with Differential/Platelet  Tomorrow morning,   R        04/17/23 1434   04/18/23 0500  Basic metabolic panel  Tomorrow morning,   R        04/17/23 1434   04/17/23 1006  Culture, blood (Routine X 2) w Reflex to ID Panel  BLOOD CULTURE X 2,   TIMED     Comments: 1st set drawn in dialysis, from permcath    04/17/23 1004   04/17/23 0809  Urine Culture (for pregnant, neutropenic or urologic patients or patients with an indwelling urinary catheter)  (Urine Labs)  Add-on,   AD       Question:  Indication  Answer:  Altered mental status (if no other cause  identified)   04/17/23 0808   04/16/23 2148  Hepatitis B surface antigen  (New Admission Hemo Labs (Hepatitis B))  Once,   R        04/16/23 2148             Author: Jonah Blue, MD 04/17/2023 2:44 PM  For on call review [email or web address].

## 2023-04-17 NOTE — Op Note (Signed)
  OPERATIVE NOTE   PROCEDURE: Removal of a right IJ tunneled dialysis catheter  PRE-OPERATIVE DIAGNOSIS: Complication of dialysis catheter, End stage renal disease  POST-OPERATIVE DIAGNOSIS: Same  SURGEON: [name], M.D.  ANESTHESIA: Local anesthetic with 1% lidocaine with epinephrine   ESTIMATED BLOOD LOSS: Minimal   FINDING(S): 1. Catheter intact   SPECIMEN(S):  Catheter  INDICATIONS:   Alexis Tucker is a 82 y.o. female who presents with signs and symptoms consistent with sepsis.  Her tunneled dialysis catheter appears to be the likely origin.  Given these findings the catheter should be removed and the tip sent for culture.  Risks and benefits were reviewed patient agrees to proceed.  DESCRIPTION: After obtaining full informed written consent, the patient was positioned supine. The right IJ catheter and surrounding area is prepped and draped in a sterile fashion. The cuff was localized by palpation and noted to be less than 3 cm from the exit site. After appropriate timeout is called, 1% lidocaine with epinephrine is infiltrated into the surrounding tissues around the cuff. Small transverse incision is created at the exit site with an 11 blade scalpel and the dissection was carried up along the catheter to expose the cuff of the tunneled catheter.  The catheter cuff is then freed from the surrounding attachments and adhesions. Once the catheter has been freed circumferentially it is removed in 1 piece.  Tip is then transected and placed in a sterile specimen cup and sent to microbiology.  Light pressure was held at the base of the neck.   Antibiotic ointment and a sterile dressing is applied to the exit site. Patient tolerated procedure well and there were no complications.  COMPLICATIONS: None  CONDITION: Unchanged  [name], M.D. [facility] Office: [phone]  04/17/2023,6:13 PM

## 2023-04-17 NOTE — TOC CM/SW Note (Signed)
Patient is not fully oriented and in HD. CSW left daughter a Engineer, technical sales. Will complete readmission prevention screen when she calls back.  Charlynn Court, CSW [phone]

## 2023-04-17 NOTE — Plan of Care (Signed)
  Problem: Education: Goal: Understanding of CV disease, CV risk reduction, and recovery process will improve Outcome: Progressing Goal: Individualized Educational Video(s) Outcome: Progressing   Problem: Activity: Goal: Ability to return to baseline activity level will improve Outcome: Progressing   Problem: Cardiovascular: Goal: Ability to achieve and maintain adequate cardiovascular perfusion will improve Outcome: Progressing Goal: Vascular access site(s) Level 0-1 will be maintained Outcome: Progressing   Problem: Health Behavior/Discharge Planning: Goal: Ability to safely manage health-related needs after discharge will improve Outcome: Progressing   Problem: Fluid Volume: Goal: Hemodynamic stability will improve Outcome: Progressing   Problem: Clinical Measurements: Goal: Diagnostic test results will improve Outcome: Progressing Goal: Signs and symptoms of infection will decrease Outcome: Progressing   Problem: Respiratory: Goal: Ability to maintain adequate ventilation will improve Outcome: Progressing   Problem: Education: Goal: Knowledge of General Education information will improve Description: Including pain rating scale, medication(s)/side effects and non-pharmacologic comfort measures Outcome: Progressing   Problem: Health Behavior/Discharge Planning: Goal: Ability to manage health-related needs will improve Outcome: Progressing   Problem: Clinical Measurements: Goal: Ability to maintain clinical measurements within normal limits will improve Outcome: Progressing Goal: Will remain free from infection Outcome: Progressing Goal: Diagnostic test results will improve Outcome: Progressing Goal: Respiratory complications will improve Outcome: Progressing Goal: Cardiovascular complication will be avoided Outcome: Progressing   Problem: Activity: Goal: Risk for activity intolerance will decrease Outcome: Progressing   Problem: Elimination: Goal: Will  not experience complications related to bowel motility Outcome: Progressing Goal: Will not experience complications related to urinary retention Outcome: Progressing   Problem: Pain Managment: Goal: General experience of comfort will improve Outcome: Progressing

## 2023-04-17 NOTE — Significant Event (Signed)
Rapid Response Event Note   Reason for Call :  Elevated heart rate and low oxygen saturation  Initial Focused Assessment:  Rapid response RN arrived in dialysis bay with patient in bed surrounded by RT and dialysis staff. Per dialysis staff, during her dialysis treatment patient's heart rate became elevated and her oxygen saturation waveform became hard to read with numbers only showing up intermittently. Lowest reading 51, so they placed her on a NRB at 15L.   Patient calm and alert. HR 130-150s irregular, appeared to be afib which patient has listed in her history. Patient's only complaint was that she was cold, which resolved when dialysis had provided warm blankets. Lungs clear but diminished bilaterally to auscultation. BP 171/143 MAP 152. RR unlabored in low 20s. Patient has PO carvedilol and imdur on her home med list and ordered for today at 10:00 but did not receive those this morning as she is in dialysis.  Interventions:  Very difficult time obtaining oxygen saturations. Hands and ears cold and even when warmed by blankets hard to get a pleth to read. Eventually RT obtained an ABG to confirm oxygenation. PaO2 321 on 15L NRB, so RT titrated patient down to 4L nasal cannula. Patient reported no issues with lower oxygen delivery. Patient taken back to room 225, BP before left was 155/116 MAP 127, HR still 130-150s. Upon arrival into the room patient drank a small sip of water from her bedside table. After a minute or so while hooking patient up to get vital signs, patient burped and then vomited yellow-green bile. Patient's face washed and patient reported she still felt "a little" nauseous but refused any nausea medication at that time.   HR 150s-160s post emesis. BP 120/104. RR unlabored at 20. Obtained a 12 lead EKG, which showed afib RVR. Dr. Ophelia Charter ordered dose of IV cardizem which was given per orders. HR post cardizem was 128, still irregular, and BP was 138/86 MAP 96.  Plan of Care:   Patient to remain on 2C for now. Dr. Ophelia Charter to come by and see patient. 2C staff to reach back out to rapid response team if any further needs.  Event Summary:   MD Notified: Dr. Ophelia Charter Call Time: [phone] Arrival Time: 09:55 End Time: 11:30  Avyanna Spada, Theresia Lo, RN

## 2023-04-17 NOTE — Progress Notes (Signed)
  Chaplain On-Call responded to Rapid Response notification at 0955 hours to the Hemodialysis Unit, bed-6.  The patient was receiving bedside care and interventions from the RR Team. There was no family present.  The patient was returned to Unit 2-C, room 225.  Chaplain assured Staff of availability if requested.  Chaplain Morene Crocker., [facility]

## 2023-04-17 NOTE — Consult Note (Signed)
PHARMACY CONSULT NOTE - ELECTROLYTES  Pharmacy Consult for Electrolyte Monitoring and Replacement   Recent Labs: Weight: 65.6 kg (144 lb 10 oz) Estimated Creatinine Clearance: 7.8 mL/min (A) (by C-G formula based on SCr of 5.21 mg/dL (H)). Potassium (mmol/L)  Date Value  04/17/2023 3.2 (L)  02/26/2014 4.3   Magnesium (mg/dL)  Date Value  57/84/6962 1.7   Calcium (mg/dL)  Date Value  95/28/4132 8.1 (L)   Calcium, Total (mg/dL)  Date Value  44/07/270 9.0   Albumin (g/dL)  Date Value  53/66/4403 2.7 (L)  10/16/2012 3.9   Phosphorus (mg/dL)  Date Value  47/42/5956 4.0   Sodium (mmol/L)  Date Value  04/17/2023 133 (L)  10/16/2012 140    Assessment  Alexis Tucker is a 82 y.o. female presenting with weakness. 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 Pharmacy has been consulted to monitor and replace electrolytes.  Diet: renal w/ fluid restriction(1228mL/day) MIVF: LR bolus  Goal of Therapy: Electrolytes WNL  Plan:  K 3.2: Kcl x 1 Mag 1.7: Mag sulfate 2g IV x 1 Check BMP, Mg, Phos with AM labs  Thank you for allowing pharmacy to be a part of this patient's care.  Bettey Costa, PharmD Clinical Pharmacist 04/17/2023 3:21 PM

## 2023-04-17 NOTE — Sepsis Progress Note (Signed)
eLink is following this Code Sepsis. °

## 2023-04-17 NOTE — Progress Notes (Signed)
Received patient in bed to unit.    Informed consent signed and in chart.    TX duration: 55 min     Transported back to floor  Hand-off given to patient's nurse.   Access used:  rt chest cvc Access issues: n/a  Total UF removed: 0 Rapid Response called and floor nurse aware.      Maple Hudson, RN Dialysis Unit

## 2023-04-18 ENCOUNTER — Encounter: Payer: Self-pay | Admitting: Vascular Surgery

## 2023-04-18 DIAGNOSIS — Z7189 Other specified counseling: Secondary | ICD-10-CM

## 2023-04-18 DIAGNOSIS — Z789 Other specified health status: Secondary | ICD-10-CM

## 2023-04-18 DIAGNOSIS — A411 Sepsis due to other specified staphylococcus: Secondary | ICD-10-CM | POA: Diagnosis not present

## 2023-04-18 DIAGNOSIS — I96 Gangrene, not elsewhere classified: Secondary | ICD-10-CM

## 2023-04-18 LAB — BLOOD GAS, ARTERIAL
Acid-Base Excess: 5.7 mmol/L — ABNORMAL HIGH (ref 0.0–2.0)
Bicarbonate: 28.6 mmol/L — ABNORMAL HIGH (ref 20.0–28.0)
O2 Content: 5 L/min
O2 Saturation: 99.6 %
Patient temperature: 37
pCO2 arterial: 35 mm[Hg] (ref 32–48)
pH, Arterial: 7.52 — ABNORMAL HIGH (ref 7.35–7.45)
pO2, Arterial: 321 mm[Hg] — ABNORMAL HIGH (ref 83–108)

## 2023-04-18 LAB — URINE CULTURE: Culture: 40000 — AB

## 2023-04-18 LAB — CBC WITH DIFFERENTIAL/PLATELET
Abs Immature Granulocytes: 0.24 10*3/uL — ABNORMAL HIGH (ref 0.00–0.07)
Basophils Absolute: 0.1 10*3/uL (ref 0.0–0.1)
Basophils Relative: 0 %
Eosinophils Absolute: 0 10*3/uL (ref 0.0–0.5)
Eosinophils Relative: 0 %
HCT: 28.8 % — ABNORMAL LOW (ref 36.0–46.0)
Hemoglobin: 9.1 g/dL — ABNORMAL LOW (ref 12.0–15.0)
Immature Granulocytes: 1 %
Lymphocytes Relative: 5 %
Lymphs Abs: 1.3 10*3/uL (ref 0.7–4.0)
MCH: 29.8 pg (ref 26.0–34.0)
MCHC: 31.6 g/dL (ref 30.0–36.0)
MCV: 94.4 fL (ref 80.0–100.0)
Monocytes Absolute: 2.9 10*3/uL — ABNORMAL HIGH (ref 0.1–1.0)
Monocytes Relative: 12 %
Neutro Abs: 19.1 10*3/uL — ABNORMAL HIGH (ref 1.7–7.7)
Neutrophils Relative %: 82 %
Platelets: 109 10*3/uL — ABNORMAL LOW (ref 150–400)
RBC: 3.05 MIL/uL — ABNORMAL LOW (ref 3.87–5.11)
RDW: 15.1 % (ref 11.5–15.5)
WBC: 23.5 10*3/uL — ABNORMAL HIGH (ref 4.0–10.5)
nRBC: 0 % (ref 0.0–0.2)

## 2023-04-18 LAB — BLOOD GAS, VENOUS
Acid-Base Excess: 1.5 mmol/L (ref 0.0–2.0)
Bicarbonate: 27.2 mmol/L (ref 20.0–28.0)
O2 Saturation: 56.8 %
Patient temperature: 37
pCO2, Ven: 46 mm[Hg] (ref 44–60)
pH, Ven: 7.38 (ref 7.25–7.43)
pO2, Ven: 37 mm[Hg] (ref 32–45)

## 2023-04-18 LAB — BASIC METABOLIC PANEL
Anion gap: 8 (ref 5–15)
BUN: 26 mg/dL — ABNORMAL HIGH (ref 8–23)
CO2: 23 mmol/L (ref 22–32)
Calcium: 8 mg/dL — ABNORMAL LOW (ref 8.9–10.3)
Chloride: 102 mmol/L (ref 98–111)
Creatinine, Ser: 4.65 mg/dL — ABNORMAL HIGH (ref 0.44–1.00)
GFR, Estimated: 9 mL/min — ABNORMAL LOW (ref >60)
Glucose, Bld: 158 mg/dL — ABNORMAL HIGH (ref 70–99)
Potassium: 4.8 mmol/L (ref 3.5–5.1)
Sodium: 133 mmol/L — ABNORMAL LOW (ref 135–145)

## 2023-04-18 LAB — PHOSPHORUS: Phosphorus: 3 mg/dL (ref 2.5–4.6)

## 2023-04-18 LAB — GLUCOSE, CAPILLARY
Glucose-Capillary: 164 mg/dL — ABNORMAL HIGH (ref 70–99)
Glucose-Capillary: 109 mg/dL — ABNORMAL HIGH (ref 70–99)
Glucose-Capillary: 91 mg/dL (ref 70–99)

## 2023-04-18 LAB — MAGNESIUM: Magnesium: 1.9 mg/dL (ref 1.7–2.4)

## 2023-04-18 MED ORDER — HYDROMORPHONE HCL 1 MG/ML IJ SOLN: 0.5000 mg | INTRAMUSCULAR | Status: DC | PRN

## 2023-04-18 MED ORDER — POLYVINYL ALCOHOL 1.4 % OP SOLN: 1.0000 [drp] | Freq: Four times a day (QID) | OPHTHALMIC | Status: DC | PRN

## 2023-04-18 MED ORDER — LIDOCAINE-EPINEPHRINE (PF) 1 %-1:200000 IJ SOLN
INTRAMUSCULAR | Status: DC | PRN
  Administered 2023-04-17: 20 mL

## 2023-04-18 MED ORDER — GLYCOPYRROLATE 0.2 MG/ML IJ SOLN: 0.2000 mg | INTRAMUSCULAR | Status: DC | PRN

## 2023-04-18 MED ORDER — GLYCOPYRROLATE 1 MG PO TABS: 1.0000 mg | ORAL_TABLET | ORAL | Status: DC | PRN

## 2023-04-18 MED ORDER — RENA-VITE PO TABS: 1.0000 | ORAL_TABLET | Freq: Every day | ORAL | Status: DC

## 2023-04-18 MED ORDER — LORAZEPAM 2 MG/ML PO CONC: 0.5000 mg | ORAL | Status: DC | PRN

## 2023-04-18 MED ORDER — BIOTENE DRY MOUTH MT LIQD: 15.0000 mL | OROMUCOSAL | Status: DC | PRN

## 2023-04-18 MED ORDER — ENSURE ENLIVE PO LIQD
237.0000 mL | Freq: Three times a day (TID) | ORAL | Status: DC
  Administered 2023-04-18 – 2023-04-19 (×2): 237 mL via ORAL

## 2023-04-18 MED ORDER — LORAZEPAM 2 MG/ML IJ SOLN: 0.5000 mg | INTRAMUSCULAR | Status: DC | PRN

## 2023-04-18 MED ORDER — OXYCODONE HCL 20 MG/ML PO CONC: 5.0000 mg | ORAL | Status: DC | PRN

## 2023-04-18 MED ORDER — LORAZEPAM 0.5 MG PO TABS: 0.5000 mg | ORAL_TABLET | ORAL | Status: DC | PRN

## 2023-04-18 MED ORDER — HALOPERIDOL LACTATE 5 MG/ML IJ SOLN: 0.5000 mg | INTRAMUSCULAR | Status: DC | PRN

## 2023-04-18 MED ORDER — HALOPERIDOL 0.5 MG PO TABS: 0.5000 mg | ORAL_TABLET | ORAL | Status: DC | PRN

## 2023-04-18 MED ORDER — HALOPERIDOL LACTATE 2 MG/ML PO CONC: 0.5000 mg | ORAL | Status: DC | PRN

## 2023-04-18 NOTE — Consult Note (Signed)
PODIATRY CONSULTATION  NAME Alexis Tucker MRN [id number] DOB Feb 17, 1941 DOA 04/16/2023   Reason for consult:  Chief Complaint  Patient presents with   Weakness   Fatigue    Consulting physician: Jonah Blue MD  History of present illness: 82 y.o. female w/ onset of ischemic gangrenous changes to the bilateral feet admitted 04/16/2023 for chief concerns of weakness and lethargy and fever. Concern for infection secondary to dialysis catheter which was removed this evening. Podiatry consulted for evaluation of ischemic changes to the feet which has been managed outpatient in office by Dr. Nicholes Rough.   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   a.) Avastin injections as [facility]   DJD (degenerative joint disease)    ESRD (end stage renal disease) (HCC)    GERD (gastroesophageal reflux disease)    Glaucoma 03/2014   History of colon cancer 2006   a.) s/p colectomy   History of hepatitis A 1990s   from food, resolved   HTN (hypertension)    Hyperplastic colon polyp    Long term current use of anticoagulant    a.) apixaban   Long term current use of antithrombotics/antiplatelets    a.) pentoxifylline   Macular degeneration    wet R with vision loss, dry L; to see retina specialist   Neuropathy    OA (osteoarthritis)    Osteonecrosis (HCC)    hips?   PONV (postoperative nausea and vomiting)    Post-surgical hypothyroidism    Thrombocytopenia (HCC)    Tubular adenoma    Wears dentures    partial upper       Latest Ref Rng & Units 04/18/2023    3:54 AM 04/17/2023    8:37 AM 04/16/2023   11:47 AM  CBC  WBC 4.0 - 10.5 K/uL 23.5  8.2  7.6   Hemoglobin 12.0 - 15.0 g/dL 9.1  9.2  40.9   Hematocrit 36.0 - 46.0 % 28.8  29.3  32.6   Platelets 150 - 400 K/uL 109  125  129        Latest Ref Rng & Units 04/18/2023    3:54 AM 04/17/2023  8:37 AM 04/16/2023   11:47 AM  BMP  Glucose 70 - 99 mg/dL 841  99  324   BUN 8 - 23 mg/dL 26  31  26    Creatinine 0.44 - 1.00 mg/dL 4.01  0.27  2.53   Sodium 135 - 145 mmol/L 133  133  136   Potassium 3.5 - 5.1 mmol/L 4.8  3.2  3.5   Chloride 98 - 111 mmol/L 102  100  97   CO2 22 - 32 mmol/L 23  26  27    Calcium 8.9 - 10.3 mg/dL 8.0  8.1  8.9       Physical Exam: General: The patient is alert and oriented x3 in no acute distress.   Dermatology: No drainage. Ischemic changes noted bilateral feet  Lower Extremity Angiography 03/22/2023   Aortogram:  This demonstrated mildly diseased renal arteries and a calcified but not stenotic aorta.  The right iliac system does not show any obvious stenosis but the iliac  bifurcation was somewhat difficult to see in the AP projection.  The left common iliac artery had minimal disease but the left external iliac artery had about an 80 to 85% stenosis in the mid to distal segment. Left Lower Extremity:  This demonstrated extensive and diffuse disease.  The profunda femoris artery was heavily diseased out of the distal portions.  The SFA was extensively diseased throughout.  In the proximal to mid segment was a near occlusive stenosis and then the distal segment was a near occlusive stenosis.  The popliteal artery was moderately diseased.  The tibioperoneal trunk occluded and all 3 tibial vessels were occluded and there was really not inline flow through any of the vessels into the foot  Lower Extremity Angiography 03/29/2023 Aortogram:  This demonstrated mildly diseased renal arteries and normal aorta and iliac segments without significant stenosis after stenting of the left iliac artery previously. Right Lower Extremity:  This demonstrated occlusion of the proximal SFA about 5 to 7 cm beyond the origin.  There were large extensive collaterals consistent with a very chronic occlusion.  There was reconstitution of the diseased above-knee popliteal artery that was highly calcific and had disease throughout its course.  The popliteal artery was somewhat difficult to image due to her knee prosthesis.  Her peroneal artery was the only runoff distally and did not have any obvious focal stenosis although it did occlude in the distal segment and only collaterals of the peroneal artery filled the foot.  There was occlusion of the anterior tibial and posterior tibial arteries without distal reconstitution.  Neurological: Dimished via light touch  Musculoskeletal Exam: Ischemic gangrenous changes noted to the bilateral toes. Appears dry and stable.     ASSESSMENT/PLAN OF CARE Ischemic gangrene bilateral toes  -Patient seen at bedside with daughter present.  -Dry gangrene noted  which appears stable. Do not suspect source of infection -Pt planning for removal of hemodialysis catheter today -Due to advanced ischemic changes and poor perfusion of the lower extremities despite angiography, recommend conservative treatment and palliative care -High probability of nonhealing after partial foot amputation.  -Continue betadine daily to toes -Will sign off    Thank you for the consult.  Please contact me directly via secure chat with any questions or concerns.     Felecia Shelling, DPM [facility]  Dr. Felecia Shelling, DPM    [address]  [place], [place] [address]                Office [phone]  Fax [phone]

## 2023-04-18 NOTE — Consult Note (Signed)
PHARMACY CONSULT NOTE - ELECTROLYTES  Pharmacy Consult for Electrolyte Monitoring and Replacement   Recent Labs: Weight: 65.6 kg (144 lb 10 oz) Estimated Creatinine Clearance: 8.8 mL/min (A) (by C-G formula based on SCr of 4.65 mg/dL (H)). Potassium (mmol/L)  Date Value  04/18/2023 4.8  02/26/2014 4.3   Magnesium (mg/dL)  Date Value  04/54/0981 1.9   Calcium (mg/dL)  Date Value  19/14/7829 8.0 (L)   Calcium, Total (mg/dL)  Date Value  56/21/3086 9.0   Albumin (g/dL)  Date Value  57/84/6962 2.7 (L)  10/16/2012 3.9   Phosphorus (mg/dL)  Date Value  95/28/4132 3.0   Sodium (mmol/L)  Date Value  04/18/2023 133 (L)  10/16/2012 140   Assessment  Alexis Tucker is a 82 y.o. female presenting with weakness. 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 Pharmacy has been consulted to monitor and replace electrolytes.  Diet: Renal w/ fluid restriction (1267mL/day) MIVF: LR bolus x 4  Goal of Therapy: Electrolytes WNL  Plan:  No electrolyte replacement warranted at this time Check BMP, Mg, Phos with AM labs  Thank you for allowing pharmacy to be a part of this patient's care.  Littie Deeds, PharmD Pharmacy Resident  04/18/2023 7:30 AM

## 2023-04-18 NOTE — Progress Notes (Addendum)
Daily Progress Note   Patient Name: Alexis Tucker       Date: 04/18/2023 DOB: 07-26-1940  Age: 82 y.o. MRN#: [id number] Attending Physician: Tresa Moore, MD Primary Care Physician: Eustaquio Boyden, MD Admit Date: 04/16/2023  Reason for Consultation/Follow-up: Establishing goals of care  Subjective: Notes and labs reviewed.  In to see patient.  Patient is currently resting in bed with covers pulled over her head; daughter is currently at bedside.    Daughter discusses that patient has been doing dialysis for years.  She states her mother was on hospice care and graduated from hospice.  She states she resumed dialysis.  She discusses that when patient had been in a rehab facility previously, she was very upset about having to be there.    She discusses that her mother's appetite has been poor and she has lost weight.  She states her mother sleeps much of the day and only leaves the house to go to dialysis and doctors appointments.  She states her mother has said in the past that she did not want to continue dialysis and has attempted to pull her dialysis catheter.  She states she would really like for her mother to tell her now what she would like to do moving forward.  Attempted to speak with patient.  She looked at me and said "yes" when asked if she had heard our conversation, and also answered "yes" when asked if she understood the conversation.  With all further questions, patient stopped answering.  She looked over several times towards her daughter, her daughter exited the room.  Patient still did not speak or answer any further questions including questions regarding desired care, or history of care.  I asked her to state her name or her daughters, but she did not  speak.  Daughter is aware she will need to assist with decision making.  She states she will continue to try to speak to her mother.  We reviewed patient's statements and decisions in the past regarding care.  Advised I would follow-up again in the morning.  ADDENDUM: Requested to return to bedside by daughter.  Daughter states she has been able to speak with her mother and her mother has indicated that she does want to stop dialysis and would like to switch to comfort care.  With discussing specifics of transition to comfort with hospice, patient then opened her eyes and kept them open.  With asking if she was okay with the current plans she said "yes".  Daughter discusses desire for hospice facility placement for her mother. Daughter discusses emotions of patient being under hospice care last time, and patient improved.  Discussed her current diagnoses and treatment.  Daughter does not want her mother to go to the OR.  She does not want further attempts to follow-up dialysis.  Among other decisions, we discussed stopping all antibiotics, glucose checks, IV fluids, and lab draws.  We discussed providing medications for patient's comfort.    I completed a MOST form today which was signed by daughter at bedside.  And the signed original was placed in the chart. Each section of options on the form were reviewed in full detail and any questions were answered as needed. The form was scanned and sent to medical records for it to be uploaded under ACP tab in Epic. A photocopy was also placed in the chart to be scanned into EMR. The patient outlined their wishes for the following treatment decisions:  Cardiopulmonary Resuscitation: Do Not Attempt Resuscitation (DNR/No CPR)  Medical Interventions: Comfort Measures: Keep clean, warm, and dry. Use medication by any route, positioning, wound care, and other measures to relieve pain and suffering. Use oxygen, suction and manual treatment of airway obstruction as  needed for comfort. Do not transfer to the hospital unless comfort needs cannot be met in current location.  Antibiotics: No antibiotics (use other measures to relieve symptoms)  IV Fluids: No IV fluids (provide other measures to ensure comfort)  Feeding Tube: No feeding tube      Length of Stay: 2  Current Medications: Scheduled Meds:   allopurinol  100 mg Oral Daily   anastrozole  1 mg Oral Daily   apixaban  2.5 mg Oral BID   aspirin EC  81 mg Oral Daily   atorvastatin  10 mg Oral Daily   Chlorhexidine Gluconate Cloth  6 each Topical Q0600   cholecalciferol  25 mcg Oral Daily   insulin aspart  0-6 Units Subcutaneous TID WC   levothyroxine  88 mcg Oral QAC breakfast   lidocaine-EPINEPHrine  20 mL Intradermal Once   midodrine  10 mg Oral TID WC   mirtazapine  7.5 mg Oral QHS   Ensure Max Protein  11 oz Oral BID AC & HS   sertraline  25 mg Oral Daily    Continuous Infusions:  sodium chloride     cefTRIAXone (ROCEPHIN)  IV 2 g (04/17/23 1211)   metronidazole Stopped (04/17/23 2226)   phenylephrine (NEO-SYNEPHRINE) Adult infusion Stopped (04/18/23 0745)   vancomycin      PRN Meds: acetaminophen **OR** acetaminophen, bisacodyl, calcium carbonate (dosed in mg elemental calcium), camphor-menthol **AND** hydrOXYzine, docusate sodium, feeding supplement (NEPRO CARB STEADY), lidocaine-EPINEPHrine (PF), ondansetron **OR** ondansetron (ZOFRAN) IV, senna-docusate, sorbitol  Physical Exam Pulmonary:     Effort: Pulmonary effort is normal.  Skin:    General: Skin is warm and dry.  Neurological:     Mental Status: She is alert.             Vital Signs: BP (!) 97/52   Pulse 72   Temp (!) 97 F (36.1 C)   Resp 15   Wt 65.6 kg   SpO2 100%   BMI 25.62 kg/m  SpO2: SpO2: 100 % O2 Device: O2 Device: Nasal Cannula O2 Flow Rate: O2 Flow  Rate (L/min): 2 L/min  Intake/output summary:  Intake/Output Summary (Last 24 hours) at 04/18/2023 1211 Last data filed at 04/18/2023  8756 Gross per 24 hour  Intake 3014.57 ml  Output 100 ml  Net 2914.57 ml   LBM: Last BM Date : 04/15/23 Baseline Weight: Weight: 65.6 kg Most recent weight: Weight: 65.6 kg      Patient Active Problem List   Diagnosis Date Noted   History of removal of tunneled central venous catheter (CVC) 04/17/2023   Fever 04/17/2023   Staphylococcus epidermidis bacteremia 04/17/2023   Sepsis due to Staphylococcus epidermidis (HCC) 04/17/2023   PVD (peripheral vascular disease) (HCC) 04/17/2023   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) 04/17/2023   Altered mental status 04/16/2023   Protein-calorie malnutrition, severe (HCC) 04/16/2023   Atherosclerosis of native arteries of the extremities with gangrene (HCC) 03/13/2023   Hepatitis B core antibody positive 02/14/2023   Hyperlipidemia associated with type 2 diabetes mellitus (HCC) 02/14/2023   Depression, major, single episode, moderate (HCC) 02/14/2023   ESRD (end stage renal disease) on dialysis (HCC) 12/10/2022   Anemia of chronic disease 12/10/2022   Acute on chronic diastolic CHF (congestive heart failure) (HCC) 12/08/2022   Multifocal pneumonia 12/06/2022   DNR (do not resuscitate) 12/03/2022   Glenohumeral arthritis, left 10/14/2022   Rotator cuff disorder, left 10/14/2022   Left arm weakness 10/13/2022   Atypical chest pain 10/13/2022   Memory impairment 10/09/2022   Bilateral hearing loss due to cerumen impaction 09/25/2022   Pruritus 09/25/2022   Decreased hearing of both ears 09/25/2022   Iron deficiency 02/20/2022   Arteriovenous graft for hemodialysis in place, primary 02/09/2022   Weakness 11/26/2021   Unintentional weight loss 09/19/2021   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   Primary open angle glaucoma (POAG) of both eyes 06/19/2020   Malignant neoplasm of upper-outer quadrant of left breast in female, estrogen receptor positive (HCC) 12/03/2017   Osteoporosis 09/29/2017   History of arterial  ischemic stroke 08/27/2017   Thrombocytopenia (HCC) 08/27/2017   Chronic atrial fibrillation with RVR (HCC)    Elevated troponin 05/04/2016   Assistance needed with transportation 07/31/2015   Advanced care planning/counseling discussion 07/27/2015   Lower back pain 03/14/2014   Macular degeneration    Diabetic retinopathy with macular edema (HCC) 03/03/2014   Medicare annual wellness visit, subsequent 01/07/2013   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  Daughter would like hospice facility placement for her mother. Daughter is H POA and documentation is present under the ACP tab.  Code Status:    Code Status Orders  (From admission, onward)           Start     Ordered   04/16/23 1539  Do not attempt resuscitation (DNR)- Limited -Do Not Intubate (DNI)  Continuous       Question Answer Comment  If pulseless and not breathing No CPR or chest compressions.   In Pre-Arrest Conditions (Patient Is Breathing and Has A Pulse) Do not intubate. Provide all appropriate non-invasive medical interventions. Avoid  ICU transfer unless indicated or required.   Consent: Discussion documented in EHR or advanced directives reviewed      04/16/23 1538           Code Status History     Date Active Date Inactive Code Status Order ID Comments User Context   03/22/2023 1339 03/22/2023 2004 Full Code [id number]  Annice Needy, MD Inpatient   12/03/2022 1756 12/15/2022 2334 DNR [id number]  Cox, Amy Dorris Carnes, DO ED   12/03/2022 1736 12/03/2022 1756 Full Code [id number]  Cox, Amy N, DO ED   10/13/2022 1808  10/14/2022 2012 DNR [id number]  Verdene Lennert, MD ED   10/13/2022 1725 10/13/2022 1808 DNR [id number]  Verdene Lennert, MD ED   04/22/2022 1843 04/26/2022 0015 DNR [id number]  Cox, Amy N, DO ED   04/22/2022 1749 04/22/2022 1843 Full Code [id number]  Cox, Amy N, DO ED   11/15/2021 2000 11/17/2021 1907 Full Code [id number]  Gertha Calkin, MD ED   05/04/2016 0512 05/08/2016 1857 Full Code [id number]  [facility]       Prognosis: Poor  50 min first visit 30 min second visit  Thank you for allowing the Palliative Medicine Team to assist in the care of this patient.   Morton Stall, NP  Please contact Palliative Medicine Team phone at [phone] for questions and concerns.

## 2023-04-18 NOTE — Progress Notes (Signed)
Initial Nutrition Assessment  DOCUMENTATION CODES:   Not applicable  INTERVENTION:   Ensure Enlive po TID, each supplement provides 350 kcal and 20 grams of protein.  Magic cup TID with meals, each supplement provides 290 kcal and 9 grams of protein  Rena-vit po daily   Pt at high refeed risk; recommend monitor potassium, magnesium and phosphorus labs daily until stable  Daily weights   NUTRITION DIAGNOSIS:   Inadequate oral intake related to poor appetite as evidenced by meal completion < 25%.  GOAL:   Patient will meet greater than or equal to 90% of their needs  MONITOR:   PO intake, Supplement acceptance, Labs, Weight trends, Skin, I & O's  REASON FOR ASSESSMENT:   Consult Assessment of nutrition requirement/status  ASSESSMENT:   82 y/o female with h/o colon cancer s/p colectomy, partial thyroidectomy, OA, DM, HTH, breast cancer, Afib, ESRD on HD, PVD, GERD, IDA, MDD, HLD, CVA and memory loss who is admitted with UTI, sepsis, bactermia and shock.  Visited pt's room today. Pt does not provide any nutrition related history. Per chart, family reports pt with poor appetite and oral intake and wt loss pta. Per chart, pt is down 22lbs(13%) over the past 5 months; this is significant weight loss. Per chart, pt documented to be eating <25% of meals in hospital. RD will add supplements and vitamins to help pt meet her estimated needs. Pt is at high refeed risk. Palliative care following for GOC.   Medications reviewed and include: allopurinol, aspirin, D3, insulin, synthroid, remeron, ceftriaxone, metronidazole, vancomycin   Labs reviewed: Na 133(L), K 4.8 wnl, BUN 26(H), creat 4.65(H), P 3.0 wnl, Mg 1.9 wnl Wbc- 23.5(H), Hgb 9.1(L), Hct 28.8(L) Cbgs- 109, 186, 164 x 48 hrs  AIC 6.1- 3/25  NUTRITION - FOCUSED PHYSICAL EXAM:  Flowsheet Row Most Recent Value  Orbital Region No depletion  Upper Arm Region No depletion  Thoracic and Lumbar Region No depletion  Buccal  Region No depletion  Temple Region No depletion  Clavicle Bone Region No depletion  Clavicle and Acromion Bone Region No depletion  Scapular Bone Region No depletion  Dorsal Hand Mild depletion  Patellar Region Moderate depletion  Anterior Thigh Region Moderate depletion  Posterior Calf Region Moderate depletion  Edema (RD Assessment) None  Hair Reviewed  Eyes Reviewed  Mouth Reviewed  Skin Reviewed  Nails Reviewed   Diet Order:   Diet Order             Diet regular Fluid consistency: Thin  Diet effective now                  EDUCATION NEEDS:   No education needs have been identified at this time  Skin:  Skin Assessment: Reviewed RN Assessment (bilateral dry gangrene in great big toes)  Last BM:  10/13  Height:   Ht Readings from Last 1 Encounters:  03/29/23 5\' 3"  (1.6 m)    Weight:   Wt Readings from Last 1 Encounters:  04/17/23 65.6 kg    Ideal Body Weight:  52.2 kg  BMI:  Body mass index is 25.62 kg/m.  Estimated Nutritional Needs:   Kcal:  1600-1800kcal/day  Protein:  80-90g/day  Fluid:  UOP +1L  Betsey Holiday MS, RD, LDN Please refer to [facility] for RD and/or RD on-call/weekend/after hours pager

## 2023-04-18 NOTE — Plan of Care (Signed)
  Problem: Cardiovascular: Goal: Ability to achieve and maintain adequate cardiovascular perfusion will improve Outcome: Progressing   Problem: Respiratory: Goal: Ability to maintain adequate ventilation will improve Outcome: Progressing   Problem: Clinical Measurements: Goal: Will remain free from infection Outcome: Progressing Goal: Diagnostic test results will improve Outcome: Progressing   Problem: Activity: Goal: Risk for activity intolerance will decrease Outcome: Progressing   Problem: Safety: Goal: Ability to remain free from injury will improve Outcome: Progressing   Problem: Skin Integrity: Goal: Risk for impaired skin integrity will decrease Outcome: Progressing

## 2023-04-18 NOTE — Progress Notes (Signed)
[facility]  ROUNDING NOTE   Subjective:   Alexis Tucker is a 82 y.o. female with breast cancer, hypertension, hypothyroidism, GERD, atrial fibrillation, and end stage renal disease on hemodialysis. Patient presents to the ED with altered mental status and febrile. She has been admitted for Altered mental status [R41.82] Weakness [R53.1] Fever, unspecified fever cause [R50.9]  Patient is known to our practice from previous admissions and received outpatient dialysis at [facility] on a MWF schedule, supervised by Dr Wynelle Link. Her last treatment was on Friday. She arrived in ED yesterday when she was too weak for daughter to get her into car for dialysis.   Labs on ED arrival unremarkable for renal patient. Respiratory panel negative for flu, RSV, and covid 19. UA appears turbid, with leukocytes and bacteria. Blood cultures positive for gram positive cocci. Urine culture neg. Chest xray negative. Febrile on arrival.   Patient was transferred to ICU overnight for hypotension.  Due to positive blood cultures, her PermCath was removed.  She also has bilateral toe necrosis. This morning, she is under bedcovers and moving around but does not engage in conversation.   Objective:  Vital signs in last 24 hours:  Temp:  [96.7 F (35.9 C)-102.1 F (38.9 C)] (P) 97.3 F (36.3 C) (10/16 1200) Pulse Rate:  [55-109] 72 (10/16 0900) Resp:  [0-23] 12 (10/16 1100) BP: (70-121)/(31-70) 115/70 (10/16 1100) SpO2:  [96 %-100 %] 100 % (10/16 0900)  Weight change:  Filed Weights   04/17/23 0824  Weight: 65.6 kg    Intake/Output: I/O last 3 completed shifts: In: 3214.6 [P.O.:230; I.V.:259.9; IV Piggyback:2724.7] Out: 200 [Urine:200]   Intake/Output this shift:  No intake/output data recorded.  Physical Exam: General: NAD  Head: Normocephalic, atraumatic.  Dry oral mucosal membranes  Eyes: Anicteric  Lungs:  Clear to auscultation  Heart: Regular rate and rhythm  Abdomen:   Soft, nontender  Extremities:  No peripheral edema.  Neurologic: Alert, moving all four extremities  Skin: No lesions  Access: Rt chest permcath    Basic Metabolic Panel: Recent Labs  Lab 04/16/23 1147 04/17/23 0837 04/18/23 0354  NA 136 133* 133*  K 3.5 3.2* 4.8  CL 97* 100 102  CO2 27 26 23   GLUCOSE 128* 99 158*  BUN 26* 31* 26*  CREATININE 4.98* 5.21* 4.65*  CALCIUM 8.9 8.1* 8.0*  MG  --   --  1.9  PHOS  --   --  3.0    Liver Function Tests: Recent Labs  Lab 04/16/23 1147  AST 16  ALT 7  ALKPHOS 68  BILITOT 0.8  PROT 7.0  ALBUMIN 2.7*   No results for input(s): "LIPASE", "AMYLASE" in the last 168 hours. No results for input(s): "AMMONIA" in the last 168 hours.  CBC: Recent Labs  Lab 04/16/23 1147 04/17/23 0837 04/18/23 0354  WBC 7.6 8.2 23.5*  NEUTROABS  --   --  19.1*  HGB 10.2* 9.2* 9.1*  HCT 32.6* 29.3* 28.8*  MCV 92.6 93.3 94.4  PLT 129* 125* 109*    Cardiac Enzymes: No results for input(s): "CKTOTAL", "CKMB", "CKMBINDEX", "TROPONINI" in the last 168 hours.  BNP: Invalid input(s): "POCBNP"  CBG: Recent Labs  Lab 04/17/23 1608 04/17/23 2210 04/18/23 1249  GLUCAP 164* 186* 109*    Microbiology: Results for orders placed or performed during the hospital encounter of 04/16/23  Resp panel by RT-PCR (RSV, Flu A&B, Covid) Anterior Nasal Swab     Status: None   Collection Time: 04/16/23  12:08 PM   Specimen: Anterior Nasal Swab  Result Value Ref Range Status   SARS Coronavirus 2 by RT PCR NEGATIVE NEGATIVE Final    Comment: (NOTE) SARS-CoV-2 target nucleic acids are NOT DETECTED.  The SARS-CoV-2 RNA is generally detectable in upper respiratory specimens during the acute phase of infection. The lowest concentration of SARS-CoV-2 viral copies this assay can detect is 138 copies/mL. A negative result does not preclude SARS-Cov-2 infection and should not be used as the sole basis for treatment or other patient management decisions. A  negative result may occur with  improper specimen collection/handling, submission of specimen other than nasopharyngeal swab, presence of viral mutation(s) within the areas targeted by this assay, and inadequate number of viral copies(<138 copies/mL). A negative result must be combined with clinical observations, patient history, and epidemiological information. The expected result is Negative.  Fact Sheet for Patients:  BloggerCourse.com  Fact Sheet for Healthcare Providers:  SeriousBroker.it  This test is no t yet approved or cleared by the [place] FDA and  has been authorized for detection and/or diagnosis of SARS-CoV-2 by FDA under an Emergency Use Authorization (EUA). This EUA will remain  in effect (meaning this test can be used) for the duration of the COVID-19 declaration under Section 564(b)(1) of the Act, 21 U.S.C.section 360bbb-3(b)(1), unless the authorization is terminated  or revoked sooner.       Influenza A by PCR NEGATIVE NEGATIVE Final   Influenza B by PCR NEGATIVE NEGATIVE Final    Comment: (NOTE) The Xpert Xpress SARS-CoV-2/FLU/RSV plus assay is intended as an aid in the diagnosis of influenza from Nasopharyngeal swab specimens and should not be used as a sole basis for treatment. Nasal washings and aspirates are unacceptable for Xpert Xpress SARS-CoV-2/FLU/RSV testing.  Fact Sheet for Patients: BloggerCourse.com  Fact Sheet for Healthcare Providers: SeriousBroker.it  This test is not yet approved or cleared by the [place] FDA and has been authorized for detection and/or diagnosis of SARS-CoV-2 by FDA under an Emergency Use Authorization (EUA). This EUA will remain in effect (meaning this test can be used) for the duration of the COVID-19 declaration under Section 564(b)(1) of the Act, 21 U.S.C. section 360bbb-3(b)(1), unless the authorization  is terminated or revoked.     Resp Syncytial Virus by PCR NEGATIVE NEGATIVE Final    Comment: (NOTE) Fact Sheet for Patients: BloggerCourse.com  Fact Sheet for Healthcare Providers: SeriousBroker.it  This test is not yet approved or cleared by the [place] FDA and has been authorized for detection and/or diagnosis of SARS-CoV-2 by FDA under an Emergency Use Authorization (EUA). This EUA will remain in effect (meaning this test can be used) for the duration of the COVID-19 declaration under Section 564(b)(1) of the Act, 21 U.S.C. section 360bbb-3(b)(1), unless the authorization is terminated or revoked.  Performed at [facility], [address] [place], [place] [address]   Blood culture (routine x 2)     Status: None (Preliminary result)   Collection Time: 04/16/23  3:39 PM   Specimen: BLOOD  Result Value Ref Range Status   Specimen Description BLOOD LEFT ANTECUBITAL  Final   Special Requests   Final    BOTTLES DRAWN AEROBIC AND ANAEROBIC Blood Culture results may not be optimal due to an excessive volume of blood received in culture bottles   Culture  Setup Time   Final    GRAM POSITIVE COCCI AEROBIC BOTTLE ONLY CRITICAL RESULT CALLED TO, READ BACK BY AND VERIFIED WITH: ALEX CHAPPELL  04/17/23 1610 MW Performed at [facility], [address] [place], [place] [address]    Culture GRAM POSITIVE COCCI  Final   Report Status PENDING  Incomplete  Blood culture (routine x 2)     Status: None (Preliminary result)   Collection Time: 04/16/23  3:39 PM   Specimen: BLOOD  Result Value Ref Range Status   Specimen Description   Final    BLOOD BLOOD RIGHT HAND Performed at [facility], [address], [place], [place] [address]    Special Requests   Final    BOTTLES DRAWN AEROBIC AND ANAEROBIC Blood Culture adequate volume Performed at [facility], [address] [place], [place]  [address]    Culture  Setup Time   Final    GRAM POSITIVE COCCI IN BOTH AEROBIC AND ANAEROBIC BOTTLES CRITICAL RESULT CALLED TO, READ BACK BY AND VERIFIED WITH: ALEX CHAPPELL 04/17/23 8295 MW Performed at [facility], [address], [place], [place] [address]    Culture GRAM POSITIVE COCCI  Final   Report Status PENDING  Incomplete  Blood Culture ID Panel (Reflexed)     Status: Abnormal   Collection Time: 04/16/23  3:39 PM  Result Value Ref Range Status   Enterococcus faecalis NOT DETECTED NOT DETECTED Final   Enterococcus Faecium NOT DETECTED NOT DETECTED Final   Listeria monocytogenes NOT DETECTED NOT DETECTED Final   Staphylococcus species DETECTED (A) NOT DETECTED Final    Comment: CRITICAL RESULT CALLED TO, READ BACK BY AND VERIFIED WITH: ALEX CHAPPELL 04/17/23 0929 MW    Staphylococcus aureus (BCID) NOT DETECTED NOT DETECTED Final   Staphylococcus epidermidis DETECTED (A) NOT DETECTED Final    Comment: Methicillin (oxacillin) resistant coagulase negative staphylococcus. Possible blood culture contaminant (unless isolated from more than one blood culture draw or clinical case suggests pathogenicity). No antibiotic treatment is indicated for blood  culture contaminants. CRITICAL RESULT CALLED TO, READ BACK BY AND VERIFIED WITH: ALEX CHAPPELL 04/17/23 0929 MW    Staphylococcus lugdunensis NOT DETECTED NOT DETECTED Final   Streptococcus species NOT DETECTED NOT DETECTED Final   Streptococcus agalactiae NOT DETECTED NOT DETECTED Final   Streptococcus pneumoniae NOT DETECTED NOT DETECTED Final   Streptococcus pyogenes NOT DETECTED NOT DETECTED Final   A.calcoaceticus-baumannii NOT DETECTED NOT DETECTED Final   Bacteroides fragilis NOT DETECTED NOT DETECTED Final   Enterobacterales NOT DETECTED NOT DETECTED Final   Enterobacter cloacae complex NOT DETECTED NOT DETECTED Final   Escherichia coli NOT DETECTED NOT DETECTED Final   Klebsiella aerogenes NOT DETECTED NOT DETECTED  Final   Klebsiella oxytoca NOT DETECTED NOT DETECTED Final   Klebsiella pneumoniae NOT DETECTED NOT DETECTED Final   Proteus species NOT DETECTED NOT DETECTED Final   Salmonella species NOT DETECTED NOT DETECTED Final   Serratia marcescens NOT DETECTED NOT DETECTED Final   Haemophilus influenzae NOT DETECTED NOT DETECTED Final   Neisseria meningitidis NOT DETECTED NOT DETECTED Final   Pseudomonas aeruginosa NOT DETECTED NOT DETECTED Final   Stenotrophomonas maltophilia NOT DETECTED NOT DETECTED Final   Candida albicans NOT DETECTED NOT DETECTED Final   Candida auris NOT DETECTED NOT DETECTED Final   Candida glabrata NOT DETECTED NOT DETECTED Final   Candida krusei NOT DETECTED NOT DETECTED Final   Candida parapsilosis NOT DETECTED NOT DETECTED Final   Candida tropicalis NOT DETECTED NOT DETECTED Final   Cryptococcus neoformans/gattii NOT DETECTED NOT DETECTED Final   Methicillin resistance mecA/C DETECTED (A) NOT DETECTED Final    Comment:  CRITICAL RESULT CALLED TO, READ BACK BY AND VERIFIED WITH: ALEX CHAPPELL 04/17/23 7829 MW Performed at [facility], [address], [place], [place] [address]   Urine Culture (for pregnant, neutropenic or urologic patients or patients with an indwelling urinary catheter)     Status: Abnormal   Collection Time: 04/17/23  5:26 AM   Specimen: Urine, Clean Catch  Result Value Ref Range Status   Specimen Description   Final    URINE, CLEAN CATCH Performed at [facility], [address], [place], [place] [address]    Special Requests   Final    NONE Performed at [facility], [address], [place], [place] [address]    Culture (A)  Final    40,000 COLONIES/mL MULTIPLE SPECIES PRESENT, SUGGEST RECOLLECTION   Report Status 04/18/2023 FINAL  Final  Culture, blood (Routine X 2) w Reflex to ID Panel     Status: None (Preliminary result)   Collection Time: 04/17/23 10:01 AM   Specimen: BLOOD  Result Value Ref Range  Status   Specimen Description BLOOD DYALISIS  Final   Special Requests   Final    BOTTLES DRAWN AEROBIC AND ANAEROBIC Blood Culture adequate volume   Culture   Final    NO GROWTH < 24 HOURS Performed at [facility], [address], [place], [place] [address]    Report Status PENDING  Incomplete  Culture, blood (Routine X 2) w Reflex to ID Panel     Status: None (Preliminary result)   Collection Time: 04/17/23 11:17 AM   Specimen: BLOOD  Result Value Ref Range Status   Specimen Description BLOOD BLOOD LEFT HAND  Final   Special Requests   Final    BOTTLES DRAWN AEROBIC AND ANAEROBIC Blood Culture adequate volume   Culture   Final    NO GROWTH < 24 HOURS Performed at [facility], [address] [place], [place] [address]    Report Status PENDING  Incomplete  MRSA Next Gen by PCR, Nasal     Status: None   Collection Time: 04/17/23  4:11 PM   Specimen: Nasal Mucosa; Nasal Swab  Result Value Ref Range Status   MRSA by PCR Next Gen NOT DETECTED NOT DETECTED Final    Comment: (NOTE) The GeneXpert MRSA Assay (FDA approved for NASAL specimens only), is one component of a comprehensive MRSA colonization surveillance program. It is not intended to diagnose MRSA infection nor to guide or monitor treatment for MRSA infections. Test performance is not FDA approved in patients less than 80 years old. Performed at [facility], [address], [place], [place] [address]   Cath Tip Culture     Status: None (Preliminary result)   Collection Time: 04/17/23  4:54 PM   Specimen: Vein; Other  Result Value Ref Range Status   Specimen Description   Final    VEIN Performed at [facility], [address], [place], [place] [address]    Special Requests   Final    Normal Performed at [facility], [address] [place], [place] [address]    Culture   Final    NO GROWTH < 12 HOURS Performed at [facility], [address]  [place], [place] [address]    Report Status PENDING  Incomplete    Coagulation Studies: No results for input(s): "LABPROT", "INR" in the last 72 hours.  Urinalysis: Recent Labs    04/17/23 0527  COLORURINE YELLOW*  LABSPEC  1.017  PHURINE 8.0  GLUCOSEU 50*  HGBUR MODERATE*  BILIRUBINUR SMALL*  KETONESUR NEGATIVE  PROTEINUR 100*  NITRITE NEGATIVE  LEUKOCYTESUR LARGE*      Imaging: PERIPHERAL VASCULAR CATHETERIZATION  Result Date: 04/17/2023 See surgical note for result.    Medications:    sodium chloride     cefTRIAXone (ROCEPHIN)  IV 2 g (04/18/23 1215)   metronidazole 500 mg (04/18/23 1247)   phenylephrine (NEO-SYNEPHRINE) Adult infusion Stopped (04/18/23 0745)   vancomycin      allopurinol  100 mg Oral Daily   anastrozole  1 mg Oral Daily   apixaban  2.5 mg Oral BID   aspirin EC  81 mg Oral Daily   atorvastatin  10 mg Oral Daily   Chlorhexidine Gluconate Cloth  6 each Topical Q0600   cholecalciferol  25 mcg Oral Daily   feeding supplement  237 mL Oral TID BM   insulin aspart  0-6 Units Subcutaneous TID WC   levothyroxine  88 mcg Oral QAC breakfast   lidocaine-EPINEPHrine  20 mL Intradermal Once   midodrine  10 mg Oral TID WC   mirtazapine  7.5 mg Oral QHS   multivitamin  1 tablet Per Tube QHS   sertraline  25 mg Oral Daily   acetaminophen **OR** acetaminophen, bisacodyl, calcium carbonate (dosed in mg elemental calcium), camphor-menthol **AND** hydrOXYzine, docusate sodium, lidocaine-EPINEPHrine (PF), ondansetron **OR** ondansetron (ZOFRAN) IV, senna-docusate, sorbitol  Assessment/ Plan:  Alexis Tucker is a 82 y.o.  female with breast cancer, hypertension, hypothyroidism, GERD, atrial fibrillation, and end stage renal disease on hemodialysis. Patient presents to the ED with altered mental status and febrile. She has been admitted for Altered mental status [R41.82] Weakness [R53.1] Fever, unspecified fever cause [R50.9]   End-stage renal disease on  hemodialysis.  Last treatment completed on Friday.  Hemodialysis treatment terminated early on Tuesday due to increased heart rate, decreased oxygen saturations and patient shivering.  Rapid response team called for assistance.  Patient placed on simple mask with saturations 89 to 90%.  Will allow patient to rest tomorrow.  Due to positive blood cultures, PermCath was removed. May request HD temp cath on Thursday if treatment is needed.  Palliative care discussions are ongoing to establish goals of care.  2.  Sepsis with bacteremia, blood cultures positive for gram-positive cocci.  Source unknown, likely HD PermCath versus UTI.  Also wounds noted on bilateral great toes.  Blood cultures obtained today.  Currently on broad-spectrum antibiotics.  Tunneled dialysis catheter has been removed. Antihypertensive medications are on hold. Patient is requiring midodrine and phenylephrine for blood pressure support.  3. Anemia of chronic kidney disease Lab Results  Component Value Date   HGB 9.1 (L) 04/18/2023    Hemoglobin within desired range.  May consider ESA.  Patient receives Mircera at outpatient clinic.  4. Secondary Hyperparathyroidism: with outpatient labs: PTH 206, phosphorus 2.2, calcium 9.3 on 04/09/23.   Lab Results  Component Value Date   PTH 311 (H) 06/11/2020   CALCIUM 8.0 (L) 04/18/2023   CAION 1.27 02/01/2022   PHOS 3.0 04/18/2023    Currently prescribed cholecalciferol outpatient.  Will continue to monitor bone minerals during this admission.     LOS: 2 Mujahid Jalomo 10/16/20241:30 PM

## 2023-04-18 NOTE — Progress Notes (Signed)
An USGPIV (ultrasound guided PIV) has been placed for short-term vasopressor infusion. A correctly placed ivWatch must be used when administering vasopressors. Should this treatment be needed beyond 72 hours, central line access should be obtained.  It will be the responsibility of the bedside nurse to follow best practice to prevent extravasations.

## 2023-04-18 NOTE — Progress Notes (Signed)
PROGRESS NOTE    Alexis Tucker  ZOX:[id number] DOB: 05/03/1941 DOA: 04/16/2023 PCP: Eustaquio Boyden, MD    Brief Narrative:   82yo with history of hypertension, hypothyroid, ESRD on MWF HD, hypothyroidism, PAF, and breast cancer who presented on 10/14 with weakness/lethargy and fever.  COVID/flu/RSV negative.  Concern for infectious etiology, given Ceftriaxone and Azithromycin.  Was in HD today and RRT was called, developed RVR in the setting of known afib.  Started on Cardizem drip and transferred to progressive care.  UA is suggestive of infection and she also has 2 necrotic toes.   Dialysis access removed by vascular surgery given possible source of infection.  Patient without dialysis access.  Ongoing discussions with palliative care regarding goals of care   Assessment & Plan:   Principal Problem:   Sepsis due to Staphylococcus epidermidis (HCC) Active Problems:   ESRD (end stage renal disease) on dialysis (HCC)   Post-surgical hypothyroidism   HTN (hypertension)   Chronic atrial fibrillation with RVR (HCC)   Type 2 diabetes mellitus with other specified complication (HCC)   OA (osteoarthritis)   History of breast cancer   DNR (do not resuscitate)   Anemia of chronic disease   Protein-calorie malnutrition, severe (HCC)   History of removal of tunneled central venous catheter (CVC)   Staphylococcus epidermidis bacteremia   PVD (peripheral vascular disease) (HCC)   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  Suspected related to dialysis line Dialysis catheter has been removed Was on peripheral vasopressors and p.o. midodrine Plan: Continue broad-spectrum antimicrobial coverage with vancomycin, Rocephin, Flagyl Continue p.o. midodrine MAP goal 50 Monitor vitals and fever curve Ongoing discussions with family and palliative care regarding GOC   Afib with RVR Known  baseline afib Rate controlled with carvedilol Went into RVR in HD this AM, not improved with 2 doses of IV diltiazem Started on dilt drip and transferred to progressive care This is thought to be related to underlying infection Plan: Continue Eliquis for now.  Further discussions regarding GOC   ESRD (end stage renal disease) on dialysis  Patient on chronic TTS HD Dialysis access was removed on 10/16 May have been the source of her bacteremia/infection Plan: Further discussion regarding GOC before replacing dialysis access Nephrology is following  PAD, necrotic toes Patient has dry gangrene of B great toes Successful angioplasty/stent placement on 9/19 Unsuccessful repeat procedure on 9/26 Saw podiatry for this issue on 10/8, no frank infection but at high risk for needing amputation Podiatry and vascular surgery are both asked to consult Plan: Hold Imdur She is scheduled for angiogram on 10/18 if it fits within GOC    HTN (hypertension) All antihypertensives held   Hypothyroidism Continue Synthroid   GERD (gastroesophageal reflux disease) Home PPI resumed   Protein-calorie malnutrition, severe (HCC) Protein max boost between meals and at bedtime ordered Encourage patient to eat at bedside Nutrition consulted Patient with very poor appetite   Anemia of chronic disease Appears to be stable at this time, will follow   DM A1c was 6.1 in 09/2022 Hold Tradjenta Cover with very sensitive-scale SSI    HLD Continue atorvastatin   History of breast cancer Home anastrozole 1 mg daily resumed   Goals of care DNR confirmed.  Palliative care and discussions with patient's daughter regarding overall GOC.  At this time my recommendation would be further de-escalation of care and consideration for engagement of hospice services.  Patient's prognosis is poor.  She appears to be suffering.  Aggressive medical interventions will likely not changes patient's trajectory  unfortunately and may worsen pain and quality of life  DVT prophylaxis: Apixaban Code Status: DNR Family Communication: None today Disposition Plan: Status is: Inpatient Remains inpatient appropriate because: Multiple acute issues as above   Level of care: Stepdown  Consultants:  Palliative care Vascular surgery Podiatry  Procedures:  None  Antimicrobials: Vancomycin Ceftriaxone Metronidazole   Subjective: Seen and examined.  Does not participate in interview.  Objective: Vitals:   04/18/23 0805 04/18/23 0900 04/18/23 1100 04/18/23 1200  BP: (!) 85/56 (!) 97/52 115/70   Pulse: 73 72    Resp: (!) 0 15 12   Temp:    (!) (P) 97.3 F (36.3 C)  TempSrc:      SpO2: 100% 100%    Weight:        Intake/Output Summary (Last 24 hours) at 04/18/2023 1356 Last data filed at 04/18/2023 4401 Gross per 24 hour  Intake 3014.57 ml  Output 100 ml  Net 2914.57 ml   Filed Weights   04/17/23 0824  Weight: 65.6 kg    Examination:  General exam: Appears weak and fatigued Respiratory system: Lungs clear.  Normal work of breathing.  2 L Cardiovascular system: S1-2, tachycardic, no murmurs Gastrointestinal system: Thin, soft, NT/ND Central nervous system: Unable to assess orientation Extremities: Unable to assess power.  Bilateral necrotic great toes Skin: No rashes, lesions or ulcers Psychiatry: Unable to assess    Data Reviewed: I have personally reviewed following labs and imaging studies  CBC: Recent Labs  Lab 04/16/23 1147 04/17/23 0837 04/18/23 0354  WBC 7.6 8.2 23.5*  NEUTROABS  --   --  19.1*  HGB 10.2* 9.2* 9.1*  HCT 32.6* 29.3* 28.8*  MCV 92.6 93.3 94.4  PLT 129* 125* 109*   Basic Metabolic Panel: Recent Labs  Lab 04/16/23 1147 04/17/23 0837 04/18/23 0354  NA 136 133* 133*  K 3.5 3.2* 4.8  CL 97* 100 102  CO2 27 26 23   GLUCOSE 128* 99 158*  BUN 26* 31* 26*  CREATININE 4.98* 5.21* 4.65*  CALCIUM 8.9 8.1* 8.0*  MG  --   --  1.9  PHOS  --    --  3.0   GFR: Estimated Creatinine Clearance: 8.8 mL/min (A) (by C-G formula based on SCr of 4.65 mg/dL (H)). Liver Function Tests: Recent Labs  Lab 04/16/23 1147  AST 16  ALT 7  ALKPHOS 68  BILITOT 0.8  PROT 7.0  ALBUMIN 2.7*   No results for input(s): "LIPASE", "AMYLASE" in the last 168 hours. No results for input(s): "AMMONIA" in the last 168 hours. Coagulation Profile: No results for input(s): "INR", "PROTIME" in the last 168 hours. Cardiac Enzymes: No results for input(s): "CKTOTAL", "CKMB", "CKMBINDEX", "TROPONINI" in the last 168 hours. BNP (last 3 results) No results for input(s): "PROBNP" in the last 8760 hours. HbA1C: No results for input(s): "HGBA1C" in the last 72 hours. CBG: Recent Labs  Lab 04/17/23 1608 04/17/23 2210 04/18/23 1249  GLUCAP 164* 186* 109*   Lipid Profile: No results for input(s): "CHOL", "HDL", "LDLCALC", "TRIG", "CHOLHDL", "LDLDIRECT" in the last 72 hours. Thyroid Function Tests: No results for input(s): "TSH", "T4TOTAL", "FREET4", "T3FREE", "THYROIDAB" in the last 72 hours. Anemia Panel: No results for input(s): "VITAMINB12", "FOLATE", "FERRITIN", "TIBC", "IRON", "RETICCTPCT" in the last 72 hours. Sepsis Labs: Recent Labs  Lab 04/16/23 1539 04/16/23 1546 04/16/23 1837 04/17/23 0837 04/17/23 1537  04/17/23 1849  PROCALCITON  --  3.25  --  5.89  --   --   LATICACIDVEN 1.1  --  1.5  --  1.6 1.4    Recent Results (from the past 240 hour(s))  Resp panel by RT-PCR (RSV, Flu A&B, Covid) Anterior Nasal Swab     Status: None   Collection Time: 04/16/23 12:08 PM   Specimen: Anterior Nasal Swab  Result Value Ref Range Status   SARS Coronavirus 2 by RT PCR NEGATIVE NEGATIVE Final    Comment: (NOTE) SARS-CoV-2 target nucleic acids are NOT DETECTED.  The SARS-CoV-2 RNA is generally detectable in upper respiratory specimens during the acute phase of infection. The lowest concentration of SARS-CoV-2 viral copies this assay can detect  is 138 copies/mL. A negative result does not preclude SARS-Cov-2 infection and should not be used as the sole basis for treatment or other patient management decisions. A negative result may occur with  improper specimen collection/handling, submission of specimen other than nasopharyngeal swab, presence of viral mutation(s) within the areas targeted by this assay, and inadequate number of viral copies(<138 copies/mL). A negative result must be combined with clinical observations, patient history, and epidemiological information. The expected result is Negative.  Fact Sheet for Patients:  BloggerCourse.com  Fact Sheet for Healthcare Providers:  SeriousBroker.it  This test is no t yet approved or cleared by the [place] FDA and  has been authorized for detection and/or diagnosis of SARS-CoV-2 by FDA under an Emergency Use Authorization (EUA). This EUA will remain  in effect (meaning this test can be used) for the duration of the COVID-19 declaration under Section 564(b)(1) of the Act, 21 U.S.C.section 360bbb-3(b)(1), unless the authorization is terminated  or revoked sooner.       Influenza A by PCR NEGATIVE NEGATIVE Final   Influenza B by PCR NEGATIVE NEGATIVE Final    Comment: (NOTE) The Xpert Xpress SARS-CoV-2/FLU/RSV plus assay is intended as an aid in the diagnosis of influenza from Nasopharyngeal swab specimens and should not be used as a sole basis for treatment. Nasal washings and aspirates are unacceptable for Xpert Xpress SARS-CoV-2/FLU/RSV testing.  Fact Sheet for Patients: BloggerCourse.com  Fact Sheet for Healthcare Providers: SeriousBroker.it  This test is not yet approved or cleared by the [place] FDA and has been authorized for detection and/or diagnosis of SARS-CoV-2 by FDA under an Emergency Use Authorization (EUA). This EUA will remain in effect  (meaning this test can be used) for the duration of the COVID-19 declaration under Section 564(b)(1) of the Act, 21 U.S.C. section 360bbb-3(b)(1), unless the authorization is terminated or revoked.     Resp Syncytial Virus by PCR NEGATIVE NEGATIVE Final    Comment: (NOTE) Fact Sheet for Patients: BloggerCourse.com  Fact Sheet for Healthcare Providers: SeriousBroker.it  This test is not yet approved or cleared by the [place] FDA and has been authorized for detection and/or diagnosis of SARS-CoV-2 by FDA under an Emergency Use Authorization (EUA). This EUA will remain in effect (meaning this test can be used) for the duration of the COVID-19 declaration under Section 564(b)(1) of the Act, 21 U.S.C. section 360bbb-3(b)(1), unless the authorization is terminated or revoked.  Performed at [facility], [address] [place], [place] [address]   Blood culture (routine x 2)     Status: None (Preliminary result)   Collection Time: 04/16/23  3:39 PM   Specimen: BLOOD  Result Value Ref Range Status   Specimen Description BLOOD LEFT ANTECUBITAL  Final  Special Requests   Final    BOTTLES DRAWN AEROBIC AND ANAEROBIC Blood Culture results may not be optimal due to an excessive volume of blood received in culture bottles   Culture  Setup Time   Final    GRAM POSITIVE COCCI AEROBIC BOTTLE ONLY CRITICAL RESULT CALLED TO, READ BACK BY AND VERIFIED WITH: ALEX CHAPPELL 04/17/23 1610 MW Performed at [facility], [address] [place], [place] [address]    Culture GRAM POSITIVE COCCI  Final   Report Status PENDING  Incomplete  Blood culture (routine x 2)     Status: None (Preliminary result)   Collection Time: 04/16/23  3:39 PM   Specimen: BLOOD  Result Value Ref Range Status   Specimen Description   Final    BLOOD BLOOD RIGHT HAND Performed at [facility], [address], [place], [place] [address]     Special Requests   Final    BOTTLES DRAWN AEROBIC AND ANAEROBIC Blood Culture adequate volume Performed at [facility], [address] [place], [place] [address]    Culture  Setup Time   Final    GRAM POSITIVE COCCI IN BOTH AEROBIC AND ANAEROBIC BOTTLES CRITICAL RESULT CALLED TO, READ BACK BY AND VERIFIED WITH: ALEX CHAPPELL 04/17/23 8295 MW Performed at [facility], [address] [place], [place] [address]    Culture GRAM POSITIVE COCCI  Final   Report Status PENDING  Incomplete  Blood Culture ID Panel (Reflexed)     Status: Abnormal   Collection Time: 04/16/23  3:39 PM  Result Value Ref Range Status   Enterococcus faecalis NOT DETECTED NOT DETECTED Final   Enterococcus Faecium NOT DETECTED NOT DETECTED Final   Listeria monocytogenes NOT DETECTED NOT DETECTED Final   Staphylococcus species DETECTED (A) NOT DETECTED Final    Comment: CRITICAL RESULT CALLED TO, READ BACK BY AND VERIFIED WITH: ALEX CHAPPELL 04/17/23 0929 MW    Staphylococcus aureus (BCID) NOT DETECTED NOT DETECTED Final   Staphylococcus epidermidis DETECTED (A) NOT DETECTED Final    Comment: Methicillin (oxacillin) resistant coagulase negative staphylococcus. Possible blood culture contaminant (unless isolated from more than one blood culture draw or clinical case suggests pathogenicity). No antibiotic treatment is indicated for blood  culture contaminants. CRITICAL RESULT CALLED TO, READ BACK BY AND VERIFIED WITH: ALEX CHAPPELL 04/17/23 0929 MW    Staphylococcus lugdunensis NOT DETECTED NOT DETECTED Final   Streptococcus species NOT DETECTED NOT DETECTED Final   Streptococcus agalactiae NOT DETECTED NOT DETECTED Final   Streptococcus pneumoniae NOT DETECTED NOT DETECTED Final   Streptococcus pyogenes NOT DETECTED NOT DETECTED Final   A.calcoaceticus-baumannii NOT DETECTED NOT DETECTED Final   Bacteroides fragilis NOT DETECTED NOT DETECTED Final   Enterobacterales NOT DETECTED NOT DETECTED  Final   Enterobacter cloacae complex NOT DETECTED NOT DETECTED Final   Escherichia coli NOT DETECTED NOT DETECTED Final   Klebsiella aerogenes NOT DETECTED NOT DETECTED Final   Klebsiella oxytoca NOT DETECTED NOT DETECTED Final   Klebsiella pneumoniae NOT DETECTED NOT DETECTED Final   Proteus species NOT DETECTED NOT DETECTED Final   Salmonella species NOT DETECTED NOT DETECTED Final   Serratia marcescens NOT DETECTED NOT DETECTED Final   Haemophilus influenzae NOT DETECTED NOT DETECTED Final   Neisseria meningitidis NOT DETECTED NOT DETECTED Final   Pseudomonas aeruginosa NOT DETECTED NOT DETECTED Final   Stenotrophomonas maltophilia NOT DETECTED NOT DETECTED Final   Candida albicans NOT DETECTED NOT DETECTED Final   Candida auris NOT DETECTED NOT  DETECTED Final   Candida glabrata NOT DETECTED NOT DETECTED Final   Candida krusei NOT DETECTED NOT DETECTED Final   Candida parapsilosis NOT DETECTED NOT DETECTED Final   Candida tropicalis NOT DETECTED NOT DETECTED Final   Cryptococcus neoformans/gattii NOT DETECTED NOT DETECTED Final   Methicillin resistance mecA/C DETECTED (A) NOT DETECTED Final    Comment: CRITICAL RESULT CALLED TO, READ BACK BY AND VERIFIED WITH: ALEX CHAPPELL 04/17/23 8657 MW Performed at [facility], [address], [place], [place] [address]   Urine Culture (for pregnant, neutropenic or urologic patients or patients with an indwelling urinary catheter)     Status: Abnormal   Collection Time: 04/17/23  5:26 AM   Specimen: Urine, Clean Catch  Result Value Ref Range Status   Specimen Description   Final    URINE, CLEAN CATCH Performed at [facility], [address] [place], [place] [address]    Special Requests   Final    NONE Performed at [facility], [address], [place], [place] [address]    Culture (A)  Final    40,000 COLONIES/mL MULTIPLE SPECIES PRESENT, SUGGEST RECOLLECTION   Report Status 04/18/2023 FINAL  Final   Culture, blood (Routine X 2) w Reflex to ID Panel     Status: None (Preliminary result)   Collection Time: 04/17/23 10:01 AM   Specimen: BLOOD  Result Value Ref Range Status   Specimen Description BLOOD DYALISIS  Final   Special Requests   Final    BOTTLES DRAWN AEROBIC AND ANAEROBIC Blood Culture adequate volume   Culture   Final    NO GROWTH < 24 HOURS Performed at [facility], [address], [place], [place] [address]    Report Status PENDING  Incomplete  Culture, blood (Routine X 2) w Reflex to ID Panel     Status: None (Preliminary result)   Collection Time: 04/17/23 11:17 AM   Specimen: BLOOD  Result Value Ref Range Status   Specimen Description BLOOD BLOOD LEFT HAND  Final   Special Requests   Final    BOTTLES DRAWN AEROBIC AND ANAEROBIC Blood Culture adequate volume   Culture   Final    NO GROWTH < 24 HOURS Performed at [facility], [address] [place], [place] [address]    Report Status PENDING  Incomplete  MRSA Next Gen by PCR, Nasal     Status: None   Collection Time: 04/17/23  4:11 PM   Specimen: Nasal Mucosa; Nasal Swab  Result Value Ref Range Status   MRSA by PCR Next Gen NOT DETECTED NOT DETECTED Final    Comment: (NOTE) The GeneXpert MRSA Assay (FDA approved for NASAL specimens only), is one component of a comprehensive MRSA colonization surveillance program. It is not intended to diagnose MRSA infection nor to guide or monitor treatment for MRSA infections. Test performance is not FDA approved in patients less than 81 years old. Performed at [facility], [address], [place], [place] [address]   Cath Tip Culture     Status: None (Preliminary result)   Collection Time: 04/17/23  4:54 PM   Specimen: Vein; Other  Result Value Ref Range Status   Specimen Description   Final    VEIN Performed at [facility], [address], [place], [place] [address]    Special Requests   Final    Normal Performed at  [facility], [address] [place], [place] [address]    Culture  Final    NO GROWTH < 12 HOURS Performed at [facility], [address] [place], [place] [address]    Report Status PENDING  Incomplete         Radiology Studies: PERIPHERAL VASCULAR CATHETERIZATION  Result Date: 04/17/2023 See surgical note for result.       Scheduled Meds:  allopurinol  100 mg Oral Daily   anastrozole  1 mg Oral Daily   apixaban  2.5 mg Oral BID   aspirin EC  81 mg Oral Daily   atorvastatin  10 mg Oral Daily   Chlorhexidine Gluconate Cloth  6 each Topical Q0600   cholecalciferol  25 mcg Oral Daily   feeding supplement  237 mL Oral TID BM   insulin aspart  0-6 Units Subcutaneous TID WC   levothyroxine  88 mcg Oral QAC breakfast   lidocaine-EPINEPHrine  20 mL Intradermal Once   midodrine  10 mg Oral TID WC   mirtazapine  7.5 mg Oral QHS   multivitamin  1 tablet Per Tube QHS   sertraline  25 mg Oral Daily   Continuous Infusions:  sodium chloride     cefTRIAXone (ROCEPHIN)  IV 2 g (04/18/23 1215)   metronidazole 500 mg (04/18/23 1247)   phenylephrine (NEO-SYNEPHRINE) Adult infusion Stopped (04/18/23 0745)   vancomycin       LOS: 2 days   CRITICAL CARE Performed by: Tresa Moore   Total critical care time: 45 minutes  Critical care time was exclusive of separately billable procedures and treating other patients.  Critical care was necessary to treat or prevent imminent or life-threatening deterioration.  Critical care was time spent personally by me on the following activities: development of treatment plan with patient and/or surrogate as well as nursing, discussions with consultants, evaluation of patient's response to treatment, examination of patient, obtaining history from patient or surrogate, ordering and performing treatments and interventions, ordering and review of laboratory studies, ordering and review of radiographic studies, pulse  oximetry and re-evaluation of patient's condition.     Tresa Moore, MD [facility]   If 7PM-7AM, please contact night-coverage  04/18/2023, 1:56 PM

## 2023-04-18 NOTE — Progress Notes (Signed)
[facility] COLLECTIVE REFERRAL NOTE  Received request from Allena Katz, SWTransitions of Care Manager, for evaluation for InPatient Hospice Unit.  Spoke with [name], patient's daughter at bedside to initiate education related to hospice philosophy, services, and team approach to care. Patient/family verbalized understanding of information given.  Patient does not meet criteria for IPU level of care.  Discussed hospice options at discharge.  [name] states if Ms. Zahm doesn't meet criteria, then she will take patient home with hospice.   DME needs discussed. Patient has the following equipment in the home:  2 wheeled walker, transport wheelchair, [facility]  Patient/family requests the following equipment for delivery:  [name] unsure of hospital bed at this time.  Hospice RN can assess for DME needs at time of admission visit.  The address has been verified and is correct in the chart.   Please send signed and completed DNR home with patient/family if applicable. Please provide prescriptions at discharge as needed to ensure ongoing symptom management.  [facility] information and contact numbers given to [name].  Above information shared with medical treatment team.  Please call with any hospice related questions or concerns.  Thank you for the opportunity to participate in this patient's care.  Norris Cross, RN Nurse Liaison [phone]

## 2023-04-19 DIAGNOSIS — A411 Sepsis due to other specified staphylococcus: Secondary | ICD-10-CM | POA: Diagnosis not present

## 2023-04-19 DIAGNOSIS — Z7189 Other specified counseling: Secondary | ICD-10-CM | POA: Diagnosis not present

## 2023-04-19 LAB — BLOOD CULTURE ID PANEL (REFLEXED) - BCID2

## 2023-04-19 NOTE — Progress Notes (Addendum)
Daily Progress Note   Patient Name: Alexis Tucker       Date: 04/19/2023 DOB: 1940/10/11  Age: 82 y.o. MRN#: [id number] Attending Physician: Tresa Moore, MD Primary Care Physician: Eustaquio Boyden, MD Admit Date: 04/16/2023  Reason for Consultation/Follow-up: Establishing goals of care  Subjective: Notes reviewed.  Into see patient.  She is currently sitting up in bed with HPOA daughter at bedside.  Today she speaks to me and answers questions.  She is confused and would be unable to make decisions by herself without her daughter who is H POA present.  She is unable to tell me where she is and states the year is 2025.  With discussing her acute and chronic healthcare, she is unable to state back to me what I have said to her regarding her diagnoses and options.  We discussed quality of life and different scenarios.  Patient states she does not like dialysis and does not want to continue dialysis.  We discussed hospice care as she has had this previously and patient and daughter agree on this.  Questions answered as able including those regarding expectations for end-of-life and symptom management.  She is aware hospice will be working closely with them.  Length of Stay: 3  Current Medications: Scheduled Meds:   allopurinol  100 mg Oral Daily   anastrozole  1 mg Oral Daily   apixaban  2.5 mg Oral BID   feeding supplement  237 mL Oral TID BM   levothyroxine  88 mcg Oral QAC breakfast   midodrine  10 mg Oral TID WC   mirtazapine  7.5 mg Oral QHS   sertraline  25 mg Oral Daily    Continuous Infusions:   PRN Meds: acetaminophen **OR** acetaminophen, antiseptic oral rinse, bisacodyl, calcium carbonate (dosed in mg elemental calcium), camphor-menthol **AND** hydrOXYzine,  docusate sodium, glycopyrrolate **OR** glycopyrrolate **OR** glycopyrrolate, haloperidol **OR** haloperidol **OR** haloperidol lactate, HYDROmorphone (DILAUDID) injection, LORazepam **OR** LORazepam **OR** LORazepam, ondansetron **OR** ondansetron (ZOFRAN) IV, oxyCODONE, polyvinyl alcohol, senna-docusate  Physical Exam Pulmonary:     Effort: Pulmonary effort is normal.  Neurological:     Mental Status: She is alert.             Vital Signs: BP 116/70 (BP Location: Left Arm)   Pulse 65   Temp 97.7 F (36.5  C) (Oral)   Resp 16   Wt 65.6 kg   SpO2 94%   BMI 25.62 kg/m  SpO2: SpO2: 94 % O2 Device: O2 Device: Room Air O2 Flow Rate: O2 Flow Rate (L/min): 2 L/min  Intake/output summary:  Intake/Output Summary (Last 24 hours) at 04/19/2023 1130 Last data filed at 04/18/2023 1700 Gross per 24 hour  Intake 91.83 ml  Output --  Net 91.83 ml   LBM: Last BM Date : 04/18/23 Baseline Weight: Weight: 65.6 kg Most recent weight: Weight: 65.6 kg        Patient Active Problem List   Diagnosis Date Noted   Goals of care, counseling/discussion 04/18/2023   Medical orders for scope of treatment form in chart 04/18/2023   History of removal of tunneled central venous catheter (CVC) 04/17/2023   Fever 04/17/2023   Staphylococcus epidermidis bacteremia 04/17/2023   Sepsis due to Staphylococcus epidermidis (HCC) 04/17/2023   PVD (peripheral vascular disease) (HCC) 04/17/2023   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) 04/17/2023   Altered mental status 04/16/2023   Protein-calorie malnutrition, severe (HCC) 04/16/2023   Atherosclerosis of native arteries of the extremities with gangrene (HCC) 03/13/2023   Hepatitis B core antibody positive 02/14/2023   Hyperlipidemia associated with type 2 diabetes mellitus (HCC) 02/14/2023   Depression, major, single episode, moderate (HCC) 02/14/2023   ESRD (end stage renal disease) on dialysis (HCC) 12/10/2022   Anemia of chronic disease 12/10/2022   Acute  on chronic diastolic CHF (congestive heart failure) (HCC) 12/08/2022   Multifocal pneumonia 12/06/2022   DNR (do not resuscitate) 12/03/2022   Glenohumeral arthritis, left 10/14/2022   Rotator cuff disorder, left 10/14/2022   Left arm weakness 10/13/2022   Atypical chest pain 10/13/2022   Memory impairment 10/09/2022   Bilateral hearing loss due to cerumen impaction 09/25/2022   Pruritus 09/25/2022   Decreased hearing of both ears 09/25/2022   Iron deficiency 02/20/2022   Arteriovenous graft for hemodialysis in place, primary 02/09/2022   Weakness 11/26/2021   Unintentional weight loss 09/19/2021   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   Primary open angle glaucoma (POAG) of both eyes 06/19/2020   Malignant neoplasm of upper-outer quadrant of left breast in female, estrogen receptor positive (HCC) 12/03/2017   Osteoporosis 09/29/2017   History of arterial ischemic stroke 08/27/2017   Thrombocytopenia (HCC) 08/27/2017   Chronic atrial fibrillation with RVR (HCC)    Elevated troponin 05/04/2016   Assistance needed with transportation 07/31/2015   Advanced care planning/counseling discussion 07/27/2015   Lower back pain 03/14/2014   Macular degeneration    Diabetic retinopathy with macular edema (HCC) 03/03/2014   Medicare annual wellness visit, subsequent 01/07/2013   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) 01/06/2013   Carotid stenosis, asymptomatic 12/05/2012   Post-surgical hypothyroidism    OA (osteoarthritis)    Type 2 diabetes mellitus with other specified complication (HCC)    HTN (hypertension)    History of breast cancer    History of colon cancer    GERD (gastroesophageal reflux disease)     Palliative Care Assessment & Plan    Recommendations/Plan: Continue plans for home with hospice  Code Status:    Code Status Orders  (From admission, onward)           Start     Ordered   04/18/23 1504  Do not attempt  resuscitation (DNR) - Comfort care  Continuous       Question Answer Comment  If patient has no pulse and is not breathing Do Not Attempt Resuscitation   In Pre-Arrest Conditions (Patient Is Breathing and Has a Pulse) Provide comfort measures. Relieve any mechanical airway obstruction. Avoid transfer unless required for comfort.   Consent: Discussion documented in EHR or advanced directives reviewed      04/18/23 1506           Code Status History     Date Active Date Inactive Code Status Order ID Comments User Context   04/16/2023 1538 04/18/2023 1506 Limited: Do not attempt resuscitation (DNR) -DNR-LIMITED -Do Not Intubate/DNI  [id number]  Cox, Amy N, DO ED   03/22/2023 1339 03/22/2023 2004 Full Code [id number]  Annice Needy, MD Inpatient   12/03/2022 1756 12/15/2022 2334 DNR [id number]  Cox, Amy N, DO ED   12/03/2022 1736 12/03/2022 1756 Full Code [id number]  Cox, Amy N, DO ED   10/13/2022 1808 10/14/2022 2012 DNR [id number]  Verdene Lennert, MD ED   10/13/2022 1725 10/13/2022 1808 DNR [id number]  Verdene Lennert, MD ED   04/22/2022 1843 04/26/2022 0015 DNR [id number]  Cox, Amy N, DO ED   04/22/2022 1749 04/22/2022 1843 Full Code [id number]  Cox, Amy N, DO ED   11/15/2021 2000 11/17/2021 1907 Full Code [id number]  Gertha Calkin, MD ED   05/04/2016 0512 05/08/2016 1857 Full Code [id number]  [facility]       Prognosis: Poor    Care plan was discussed with attending  Thank you for allowing the Palliative Medicine Team to assist in the care of this patient.     Morton Stall, NP  Please contact Palliative Medicine Team phone at [phone] for questions and concerns.

## 2023-04-19 NOTE — TOC Progression Note (Signed)
Transition of Care [facility]) - Progression Note    Patient Details  Name: Alexis Tucker MRN: [id number] Date of Birth: 11-20-1940  Transition of Care [facility]) CM/SW Contact  Garret Reddish, RN Phone Number: 04/19/2023, 2:19 PM  Clinical Narrative:    Chart reviewed.  [facility] following patient.  Tentative discharge planned for tomorrow with patient going home with support of family and [facility].  [facility] has planned visit to open patient up with Hospice on tommorow at 6pm.    TOC will continue to follow for discharge planning.     Expected Discharge Plan:  (TBD) Barriers to Discharge: Continued Medical Work up  Expected Discharge Plan and Services     Post Acute Care Choice:  (TBD) Living arrangements for the past 2 months: Single Family Home                                       Social Determinants of Health (SDOH) Interventions SDOH Screenings   Food Insecurity: No Food Insecurity (02/13/2023)  Housing: Low Risk  (02/13/2023)  Transportation Needs: No Transportation Needs (02/13/2023)  Utilities: Not At Risk (02/13/2023)  Alcohol Screen: Low Risk  (02/13/2023)  Depression (PHQ2-9): Low Risk  (02/13/2023)  Financial Resource Strain: Low Risk  (02/13/2023)  Physical Activity: Inactive (02/13/2023)  Social Connections: Socially Isolated (02/13/2023)  Stress: No Stress Concern Present (02/13/2023)  Tobacco Use: Medium Risk (04/16/2023)  Health Literacy: Adequate Health Literacy (02/13/2023)    Readmission Risk Interventions    04/17/2023   10:00 AM 12/07/2022   12:55 PM  Readmission Risk Prevention Plan  Transportation Screening Complete Complete  PCP or Specialist Appt within 3-5 Days  Complete  HRI or Home Care Consult  Complete  Social Work Consult for Recovery Care Planning/Counseling  Complete  Palliative Care Screening  Not Applicable  Medication Review Oceanographer) Complete Complete  PCP or Specialist appointment  within 3-5 days of discharge Complete   SW Recovery Care/Counseling Consult Complete   Palliative Care Screening Not Applicable   Skilled Nursing Facility Not Applicable

## 2023-04-19 NOTE — Care Management Important Message (Signed)
Important Message  Patient Details  Name: Alexis Tucker MRN: [id number] Date of Birth: 14-Aug-1940   Important Message Given:  N/A - LOS <3 / Initial given by admissions     Olegario Messier A Miah Boye 04/19/2023, 8:50 AM

## 2023-04-19 NOTE — Progress Notes (Addendum)
PROGRESS NOTE    Alexis Tucker  MWN:[id number] DOB: 25-Jan-1941 DOA: 04/16/2023 PCP: Eustaquio Boyden, MD    Brief Narrative:   82yo with history of hypertension, hypothyroid, ESRD on MWF HD, hypothyroidism, PAF, and breast cancer who presented on 10/14 with weakness/lethargy and fever.  COVID/flu/RSV negative.  Concern for infectious etiology, given Ceftriaxone and Azithromycin.  Was in HD today and RRT was called, developed RVR in the setting of known afib.  Started on Cardizem drip and transferred to progressive care.  UA is suggestive of infection and she also has 2 necrotic toes.   Dialysis access removed by vascular surgery given possible source of infection.  Patient without dialysis access.  Ongoing discussions with palliative care regarding goals of care   Assessment & Plan:   Principal Problem:   Sepsis due to Staphylococcus epidermidis (HCC) Active Problems:   ESRD (end stage renal disease) on dialysis (HCC)   Post-surgical hypothyroidism   HTN (hypertension)   Chronic atrial fibrillation with RVR (HCC)   Type 2 diabetes mellitus with other specified complication (HCC)   OA (osteoarthritis)   History of breast cancer   DNR (do not resuscitate)   Anemia of chronic disease   Protein-calorie malnutrition, severe (HCC)   History of removal of tunneled central venous catheter (CVC)   Staphylococcus epidermidis bacteremia   PVD (peripheral vascular disease) (HCC)   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  Palliative care and discussions with patient's daughter regarding overall GOC.  At this time my recommendation would be further de-escalation of care and consideration for engagement of hospice services.  Patient's prognosis is poor.  She appears to be suffering.  Aggressive medical interventions will likely not changes patient's trajectory  unfortunately and may worsen pain and quality of life  10/17: After discussion between patient, her daughter, palliative care on 10/16 the decision was made to proceed with full comfort measures and hospice referral.  Unfortunately patient was deemed not a candidate for inpatient hospice unit.  At this time current plan of care is for discharge home with hospice services.  Plan for discharge 10/18  DVT prophylaxis: Apixaban Code Status: DNR Family Communication: None today Disposition Plan: Status is: Inpatient Remains inpatient appropriate because: Multiple acute issues as above   Level of care: Palliative Care  Consultants:  Palliative care Vascular surgery Podiatry  Procedures:  None  Antimicrobials:   Subjective: Seen in and examined.  More communicative this morning  Objective: Vitals:   04/18/23 1300 04/18/23 1400 04/18/23 1627 04/19/23 0734  BP: 127/66 110/60 117/68 116/70  Pulse: (!) 42 (!) 48 77 65  Resp:   20 16  Temp:   (!) 97.5 F (36.4 C) 97.7 F (36.5 C)  TempSrc:   Oral Oral  SpO2: (!) 86% 98% 98% 94%  Weight:        Intake/Output Summary (Last 24 hours) at 04/19/2023 1411 Last data filed at 04/19/2023 1100 Gross per 24 hour  Intake 211.83 ml  Output --  Net 211.83 ml   Filed Weights   04/17/23 0824  Weight: 65.6 kg    Examination:  General exam: Appears weak and fatigued Respiratory system: Lungs clear.  Normal work of breathing.  2 L Cardiovascular system: S1-S2, RRR, no murmurs, no pedal edema Gastrointestinal system: Thin, soft, NT/ND Central nervous system: Unable to assess orientation Extremities: Unable  to assess power.  Bilateral necrotic great toes Skin: No rashes, lesions or ulcers Psychiatry: Unable to assess    Data Reviewed: I have personally reviewed following labs and imaging studies  CBC: Recent Labs  Lab 04/16/23 1147 04/17/23 0837 04/18/23 0354  WBC 7.6 8.2 23.5*  NEUTROABS  --   --  19.1*  HGB 10.2* 9.2*  9.1*  HCT 32.6* 29.3* 28.8*  MCV 92.6 93.3 94.4  PLT 129* 125* 109*   Basic Metabolic Panel: Recent Labs  Lab 04/16/23 1147 04/17/23 0837 04/18/23 0354  NA 136 133* 133*  K 3.5 3.2* 4.8  CL 97* 100 102  CO2 27 26 23   GLUCOSE 128* 99 158*  BUN 26* 31* 26*  CREATININE 4.98* 5.21* 4.65*  CALCIUM 8.9 8.1* 8.0*  MG  --   --  1.9  PHOS  --   --  3.0   GFR: Estimated Creatinine Clearance: 8.8 mL/min (A) (by C-G formula based on SCr of 4.65 mg/dL (H)). Liver Function Tests: Recent Labs  Lab 04/16/23 1147  AST 16  ALT 7  ALKPHOS 68  BILITOT 0.8  PROT 7.0  ALBUMIN 2.7*   No results for input(s): "LIPASE", "AMYLASE" in the last 168 hours. No results for input(s): "AMMONIA" in the last 168 hours. Coagulation Profile: No results for input(s): "INR", "PROTIME" in the last 168 hours. Cardiac Enzymes: No results for input(s): "CKTOTAL", "CKMB", "CKMBINDEX", "TROPONINI" in the last 168 hours. BNP (last 3 results) No results for input(s): "PROBNP" in the last 8760 hours. HbA1C: No results for input(s): "HGBA1C" in the last 72 hours. CBG: Recent Labs  Lab 04/17/23 1608 04/17/23 2210 04/18/23 1249 04/18/23 1534  GLUCAP 164* 186* 109* 91   Lipid Profile: No results for input(s): "CHOL", "HDL", "LDLCALC", "TRIG", "CHOLHDL", "LDLDIRECT" in the last 72 hours. Thyroid Function Tests: No results for input(s): "TSH", "T4TOTAL", "FREET4", "T3FREE", "THYROIDAB" in the last 72 hours. Anemia Panel: No results for input(s): "VITAMINB12", "FOLATE", "FERRITIN", "TIBC", "IRON", "RETICCTPCT" in the last 72 hours. Sepsis Labs: Recent Labs  Lab 04/16/23 1539 04/16/23 1546 04/16/23 1837 04/17/23 0837 04/17/23 1537 04/17/23 1849  PROCALCITON  --  3.25  --  5.89  --   --   LATICACIDVEN 1.1  --  1.5  --  1.6 1.4    Recent Results (from the past 240 hour(s))  Resp panel by RT-PCR (RSV, Flu A&B, Covid) Anterior Nasal Swab     Status: None   Collection Time: 04/16/23 12:08 PM    Specimen: Anterior Nasal Swab  Result Value Ref Range Status   SARS Coronavirus 2 by RT PCR NEGATIVE NEGATIVE Final    Comment: (NOTE) SARS-CoV-2 target nucleic acids are NOT DETECTED.  The SARS-CoV-2 RNA is generally detectable in upper respiratory specimens during the acute phase of infection. The lowest concentration of SARS-CoV-2 viral copies this assay can detect is 138 copies/mL. A negative result does not preclude SARS-Cov-2 infection and should not be used as the sole basis for treatment or other patient management decisions. A negative result may occur with  improper specimen collection/handling, submission of specimen other than nasopharyngeal swab, presence of viral mutation(s) within the areas targeted by this assay, and inadequate number of viral copies(<138 copies/mL). A negative result must be combined with clinical observations, patient history, and epidemiological information. The expected result is Negative.  Fact Sheet for Patients:  BloggerCourse.com  Fact Sheet for Healthcare Providers:  SeriousBroker.it  This test is no t yet approved or cleared by the [place]  [place] FDA and  has been authorized for detection and/or diagnosis of SARS-CoV-2 by FDA under an Emergency Use Authorization (EUA). This EUA will remain  in effect (meaning this test can be used) for the duration of the COVID-19 declaration under Section 564(b)(1) of the Act, 21 U.S.C.section 360bbb-3(b)(1), unless the authorization is terminated  or revoked sooner.       Influenza A by PCR NEGATIVE NEGATIVE Final   Influenza B by PCR NEGATIVE NEGATIVE Final    Comment: (NOTE) The Xpert Xpress SARS-CoV-2/FLU/RSV plus assay is intended as an aid in the diagnosis of influenza from Nasopharyngeal swab specimens and should not be used as a sole basis for treatment. Nasal washings and aspirates are unacceptable for Xpert Xpress  SARS-CoV-2/FLU/RSV testing.  Fact Sheet for Patients: BloggerCourse.com  Fact Sheet for Healthcare Providers: SeriousBroker.it  This test is not yet approved or cleared by the [place] FDA and has been authorized for detection and/or diagnosis of SARS-CoV-2 by FDA under an Emergency Use Authorization (EUA). This EUA will remain in effect (meaning this test can be used) for the duration of the COVID-19 declaration under Section 564(b)(1) of the Act, 21 U.S.C. section 360bbb-3(b)(1), unless the authorization is terminated or revoked.     Resp Syncytial Virus by PCR NEGATIVE NEGATIVE Final    Comment: (NOTE) Fact Sheet for Patients: BloggerCourse.com  Fact Sheet for Healthcare Providers: SeriousBroker.it  This test is not yet approved or cleared by the [place] FDA and has been authorized for detection and/or diagnosis of SARS-CoV-2 by FDA under an Emergency Use Authorization (EUA). This EUA will remain in effect (meaning this test can be used) for the duration of the COVID-19 declaration under Section 564(b)(1) of the Act, 21 U.S.C. section 360bbb-3(b)(1), unless the authorization is terminated or revoked.  Performed at [facility], [address] [place], [place] [address]   Blood culture (routine x 2)     Status: Abnormal (Preliminary result)   Collection Time: 04/16/23  3:39 PM   Specimen: BLOOD  Result Value Ref Range Status   Specimen Description   Final    BLOOD LEFT ANTECUBITAL Performed at [facility], [address], [place], [place] [address]    Special Requests   Final    BOTTLES DRAWN AEROBIC AND ANAEROBIC Blood Culture results may not be optimal due to an excessive volume of blood received in culture bottles Performed at [facility], [address] [place], [place] [address]    Culture  Setup Time   Final    GRAM  POSITIVE COCCI AEROBIC BOTTLE ONLY CRITICAL RESULT CALLED TO, READ BACK BY AND VERIFIED WITH: ALEX CHAPPELL 04/17/23 1027 MW Performed at [facility], [address] [place], [place] [address]    Culture (A)  Final    STAPHYLOCOCCUS HAEMOLYTICUS CULTURE REINCUBATED FOR BETTER GROWTH Performed at [facility], [address] [place], [place] [address]    Report Status PENDING  Incomplete  Blood culture (routine x 2)     Status: Abnormal (Preliminary result)   Collection Time: 04/16/23  3:39 PM   Specimen: BLOOD  Result Value Ref Range Status   Specimen Description   Final    BLOOD BLOOD RIGHT HAND Performed at [facility], [address], [place], [place] [address]    Special Requests   Final    BOTTLES DRAWN AEROBIC AND ANAEROBIC Blood Culture adequate volume Performed at [facility], [address], [place], [place] [address]  Culture  Setup Time   Final    GRAM POSITIVE COCCI IN BOTH AEROBIC AND ANAEROBIC BOTTLES CRITICAL RESULT CALLED TO, READ BACK BY AND VERIFIED WITH: ALEX CHAPPELL 04/17/23 0929 MW    Culture (A)  Final    STAPHYLOCOCCUS CAPITIS CULTURE REINCUBATED FOR BETTER GROWTH Performed at [facility], [address], [place], [place] [address]    Report Status PENDING  Incomplete  Blood Culture ID Panel (Reflexed)     Status: Abnormal   Collection Time: 04/16/23  3:39 PM  Result Value Ref Range Status   Enterococcus faecalis NOT DETECTED NOT DETECTED Final   Enterococcus Faecium NOT DETECTED NOT DETECTED Final   Listeria monocytogenes NOT DETECTED NOT DETECTED Final   Staphylococcus species DETECTED (A) NOT DETECTED Final    Comment: CRITICAL RESULT CALLED TO, READ BACK BY AND VERIFIED WITH: ALEX CHAPPELL 04/17/23 0929 MW    Staphylococcus aureus (BCID) NOT DETECTED NOT DETECTED Final   Staphylococcus epidermidis DETECTED (A) NOT DETECTED Final    Comment: Methicillin (oxacillin) resistant coagulase negative  staphylococcus. Possible blood culture contaminant (unless isolated from more than one blood culture draw or clinical case suggests pathogenicity). No antibiotic treatment is indicated for blood  culture contaminants. CRITICAL RESULT CALLED TO, READ BACK BY AND VERIFIED WITH: ALEX CHAPPELL 04/17/23 0929 MW    Staphylococcus lugdunensis NOT DETECTED NOT DETECTED Final   Streptococcus species NOT DETECTED NOT DETECTED Final   Streptococcus agalactiae NOT DETECTED NOT DETECTED Final   Streptococcus pneumoniae NOT DETECTED NOT DETECTED Final   Streptococcus pyogenes NOT DETECTED NOT DETECTED Final   A.calcoaceticus-baumannii NOT DETECTED NOT DETECTED Final   Bacteroides fragilis NOT DETECTED NOT DETECTED Final   Enterobacterales NOT DETECTED NOT DETECTED Final   Enterobacter cloacae complex NOT DETECTED NOT DETECTED Final   Escherichia coli NOT DETECTED NOT DETECTED Final   Klebsiella aerogenes NOT DETECTED NOT DETECTED Final   Klebsiella oxytoca NOT DETECTED NOT DETECTED Final   Klebsiella pneumoniae NOT DETECTED NOT DETECTED Final   Proteus species NOT DETECTED NOT DETECTED Final   Salmonella species NOT DETECTED NOT DETECTED Final   Serratia marcescens NOT DETECTED NOT DETECTED Final   Haemophilus influenzae NOT DETECTED NOT DETECTED Final   Neisseria meningitidis NOT DETECTED NOT DETECTED Final   Pseudomonas aeruginosa NOT DETECTED NOT DETECTED Final   Stenotrophomonas maltophilia NOT DETECTED NOT DETECTED Final   Candida albicans NOT DETECTED NOT DETECTED Final   Candida auris NOT DETECTED NOT DETECTED Final   Candida glabrata NOT DETECTED NOT DETECTED Final   Candida krusei NOT DETECTED NOT DETECTED Final   Candida parapsilosis NOT DETECTED NOT DETECTED Final   Candida tropicalis NOT DETECTED NOT DETECTED Final   Cryptococcus neoformans/gattii NOT DETECTED NOT DETECTED Final   Methicillin resistance mecA/C DETECTED (A) NOT DETECTED Final    Comment: CRITICAL RESULT CALLED TO, READ  BACK BY AND VERIFIED WITH: ALEX CHAPPELL 04/17/23 4696 MW Performed at [facility], [address] [place], [place] [address]   Urine Culture (for pregnant, neutropenic or urologic patients or patients with an indwelling urinary catheter)     Status: Abnormal   Collection Time: 04/17/23  5:26 AM   Specimen: Urine, Clean Catch  Result Value Ref Range Status   Specimen Description   Final    URINE, CLEAN CATCH Performed at [facility], [address] [place], [place] [address]    Special Requests   Final    NONE Performed at [facility], [address] [place], [place] [address]    Culture (A)  Final    40,000 COLONIES/mL MULTIPLE SPECIES PRESENT, SUGGEST RECOLLECTION   Report Status 04/18/2023 FINAL  Final  Culture, blood (Routine X 2) w Reflex to ID Panel     Status: None (Preliminary result)   Collection Time: 04/17/23 10:01 AM   Specimen: BLOOD  Result Value Ref Range Status   Specimen Description BLOOD DYALISIS  Final   Special Requests   Final    BOTTLES DRAWN AEROBIC AND ANAEROBIC Blood Culture adequate volume   Culture   Final    NO GROWTH 2 DAYS Performed at [facility], [address] [place], [place] [address]    Report Status PENDING  Incomplete  Culture, blood (Routine X 2) w Reflex to ID Panel     Status: None (Preliminary result)   Collection Time: 04/17/23 11:17 AM   Specimen: BLOOD  Result Value Ref Range Status   Specimen Description BLOOD BLOOD LEFT HAND  Final   Special Requests   Final    BOTTLES DRAWN AEROBIC AND ANAEROBIC Blood Culture adequate volume   Culture  Setup Time   Final    GRAM NEGATIVE RODS ANAEROBIC BOTTLE ONLY Organism ID to follow CRITICAL RESULT CALLED TO, READ BACK BY AND VERIFIED WITH: [email or web address]  04/19/23 RH    Culture   Final    NO GROWTH 2 DAYS Performed at [facility], [address] [place], [place] [address]    Report Status PENDING  Incomplete  Blood Culture  ID Panel (Reflexed)     Status: None   Collection Time: 04/17/23 11:17 AM  Result Value Ref Range Status   Enterococcus faecalis NOT DETECTED NOT DETECTED Final   Enterococcus Faecium NOT DETECTED NOT DETECTED Final   Listeria monocytogenes NOT DETECTED NOT DETECTED Final   Staphylococcus species NOT DETECTED NOT DETECTED Final   Staphylococcus aureus (BCID) NOT DETECTED NOT DETECTED Final   Staphylococcus epidermidis NOT DETECTED NOT DETECTED Final   Staphylococcus lugdunensis NOT DETECTED NOT DETECTED Final   Streptococcus species NOT DETECTED NOT DETECTED Final   Streptococcus agalactiae NOT DETECTED NOT DETECTED Final   Streptococcus pneumoniae NOT DETECTED NOT DETECTED Final   Streptococcus pyogenes NOT DETECTED NOT DETECTED Final   A.calcoaceticus-baumannii NOT DETECTED NOT DETECTED Final   Bacteroides fragilis NOT DETECTED NOT DETECTED Final   Enterobacterales NOT DETECTED NOT DETECTED Final   Enterobacter cloacae complex NOT DETECTED NOT DETECTED Final   Escherichia coli NOT DETECTED NOT DETECTED Final   Klebsiella aerogenes NOT DETECTED NOT DETECTED Final   Klebsiella oxytoca NOT DETECTED NOT DETECTED Final   Klebsiella pneumoniae NOT DETECTED NOT DETECTED Final   Proteus species NOT DETECTED NOT DETECTED Final   Salmonella species NOT DETECTED NOT DETECTED Final   Serratia marcescens NOT DETECTED NOT DETECTED Final   Haemophilus influenzae NOT DETECTED NOT DETECTED Final   Neisseria meningitidis NOT DETECTED NOT DETECTED Final   Pseudomonas aeruginosa NOT DETECTED NOT DETECTED Final   Stenotrophomonas maltophilia NOT DETECTED NOT DETECTED Final   Candida albicans NOT DETECTED NOT DETECTED Final   Candida auris NOT DETECTED NOT DETECTED Final   Candida glabrata NOT DETECTED NOT DETECTED Final   Candida krusei NOT DETECTED NOT DETECTED Final   Candida parapsilosis NOT DETECTED NOT DETECTED Final   Candida tropicalis NOT DETECTED NOT DETECTED Final   Cryptococcus  neoformans/gattii NOT DETECTED NOT DETECTED Final    Comment: Performed at [facility], [address], [place], [place] [address]  MRSA Next Gen by PCR, Nasal     Status: None   Collection Time: 04/17/23  4:11 PM   Specimen: Nasal Mucosa; Nasal Swab  Result Value Ref Range Status   MRSA by PCR Next Gen NOT DETECTED NOT DETECTED Final    Comment: (NOTE) The GeneXpert MRSA Assay (FDA approved for NASAL specimens only), is one component of a comprehensive MRSA colonization surveillance program. It is not intended to diagnose MRSA infection nor to guide or monitor treatment for MRSA infections. Test performance is not FDA approved in patients less than 38 years old. Performed at [facility], [address] [place], [place] [address]   Cath Tip Culture     Status: None (Preliminary result)   Collection Time: 04/17/23  4:54 PM   Specimen: Vein; Other  Result Value Ref Range Status   Specimen Description   Final    VEIN Performed at [facility], [address] [place], [place] [address]    Special Requests   Final    Normal Performed at [facility], [address] [place], [place] [address]    Culture   Final    NO GROWTH 2 DAYS Performed at [facility], [address], [place], [place] [address]    Report Status PENDING  Incomplete         Radiology Studies: PERIPHERAL VASCULAR CATHETERIZATION  Result Date: 04/17/2023 See surgical note for result.       Scheduled Meds:  allopurinol  100 mg Oral Daily   anastrozole  1 mg Oral Daily   apixaban  2.5 mg Oral BID   feeding supplement  237 mL Oral TID BM   levothyroxine  88 mcg Oral QAC breakfast   midodrine  10 mg Oral TID WC   mirtazapine  7.5 mg Oral QHS   sertraline  25 mg Oral Daily   Continuous Infusions:     LOS: 3 days       Tresa Moore, MD [facility]   If 7PM-7AM, please contact night-coverage  04/19/2023, 2:11 PM

## 2023-04-19 NOTE — Progress Notes (Signed)
PHARMACY - PHYSICIAN COMMUNICATION CRITICAL VALUE ALERT - BLOOD CULTURE IDENTIFICATION (BCID)  Results for orders placed or performed during the hospital encounter of 04/16/23  Resp panel by RT-PCR (RSV, Flu A&B, Covid) Anterior Nasal Swab     Status: None   Collection Time: 04/16/23 12:08 PM   Specimen: Anterior Nasal Swab  Result Value Ref Range Status   SARS Coronavirus 2 by RT PCR NEGATIVE NEGATIVE Final    Comment: (NOTE) SARS-CoV-2 target nucleic acids are NOT DETECTED.  The SARS-CoV-2 RNA is generally detectable in upper respiratory specimens during the acute phase of infection. The lowest concentration of SARS-CoV-2 viral copies this assay can detect is 138 copies/mL. A negative result does not preclude SARS-Cov-2 infection and should not be used as the sole basis for treatment or other patient management decisions. A negative result may occur with  improper specimen collection/handling, submission of specimen other than nasopharyngeal swab, presence of viral mutation(s) within the areas targeted by this assay, and inadequate number of viral copies(<138 copies/mL). A negative result must be combined with clinical observations, patient history, and epidemiological information. The expected result is Negative.  Fact Sheet for Patients:  BloggerCourse.com  Fact Sheet for Healthcare Providers:  SeriousBroker.it  This test is no t yet approved or cleared by the [place] FDA and  has been authorized for detection and/or diagnosis of SARS-CoV-2 by FDA under an Emergency Use Authorization (EUA). This EUA will remain  in effect (meaning this test can be used) for the duration of the COVID-19 declaration under Section 564(b)(1) of the Act, 21 U.S.C.section 360bbb-3(b)(1), unless the authorization is terminated  or revoked sooner.       Influenza A by PCR NEGATIVE NEGATIVE Final   Influenza B by PCR NEGATIVE NEGATIVE Final     Comment: (NOTE) The Xpert Xpress SARS-CoV-2/FLU/RSV plus assay is intended as an aid in the diagnosis of influenza from Nasopharyngeal swab specimens and should not be used as a sole basis for treatment. Nasal washings and aspirates are unacceptable for Xpert Xpress SARS-CoV-2/FLU/RSV testing.  Fact Sheet for Patients: BloggerCourse.com  Fact Sheet for Healthcare Providers: SeriousBroker.it  This test is not yet approved or cleared by the [place] FDA and has been authorized for detection and/or diagnosis of SARS-CoV-2 by FDA under an Emergency Use Authorization (EUA). This EUA will remain in effect (meaning this test can be used) for the duration of the COVID-19 declaration under Section 564(b)(1) of the Act, 21 U.S.C. section 360bbb-3(b)(1), unless the authorization is terminated or revoked.     Resp Syncytial Virus by PCR NEGATIVE NEGATIVE Final    Comment: (NOTE) Fact Sheet for Patients: BloggerCourse.com  Fact Sheet for Healthcare Providers: SeriousBroker.it  This test is not yet approved or cleared by the [place] FDA and has been authorized for detection and/or diagnosis of SARS-CoV-2 by FDA under an Emergency Use Authorization (EUA). This EUA will remain in effect (meaning this test can be used) for the duration of the COVID-19 declaration under Section 564(b)(1) of the Act, 21 U.S.C. section 360bbb-3(b)(1), unless the authorization is terminated or revoked.  Performed at [facility], [address] [place], [place] [address]   Blood culture (routine x 2)     Status: Abnormal (Preliminary result)   Collection Time: 04/16/23  3:39 PM   Specimen: BLOOD  Result Value Ref Range Status   Specimen Description   Final    BLOOD LEFT ANTECUBITAL Performed at [facility], [address], [place], [place] [address]  Special Requests    Final    BOTTLES DRAWN AEROBIC AND ANAEROBIC Blood Culture results may not be optimal due to an excessive volume of blood received in culture bottles Performed at [facility], [address] [place], [place] [address]    Culture  Setup Time   Final    GRAM POSITIVE COCCI AEROBIC BOTTLE ONLY CRITICAL RESULT CALLED TO, READ BACK BY AND VERIFIED WITH: ALEX CHAPPELL 04/17/23 6045 MW Performed at [facility], [address] [place], [place] [address]    Culture (A)  Final    STAPHYLOCOCCUS HAEMOLYTICUS CULTURE REINCUBATED FOR BETTER GROWTH Performed at [facility], [address], [place], [place] [address]    Report Status PENDING  Incomplete  Blood culture (routine x 2)     Status: Abnormal (Preliminary result)   Collection Time: 04/16/23  3:39 PM   Specimen: BLOOD  Result Value Ref Range Status   Specimen Description   Final    BLOOD BLOOD RIGHT HAND Performed at [facility], [address], [place], [place] [address]    Special Requests   Final    BOTTLES DRAWN AEROBIC AND ANAEROBIC Blood Culture adequate volume Performed at [facility], [address] [place], [place] [address]    Culture  Setup Time   Final    GRAM POSITIVE COCCI IN BOTH AEROBIC AND ANAEROBIC BOTTLES CRITICAL RESULT CALLED TO, READ BACK BY AND VERIFIED WITH: ALEX CHAPPELL 04/17/23 0929 MW    Culture (A)  Final    STAPHYLOCOCCUS CAPITIS CULTURE REINCUBATED FOR BETTER GROWTH Performed at [facility], [address], [place], [place] [address]    Report Status PENDING  Incomplete  Blood Culture ID Panel (Reflexed)     Status: Abnormal   Collection Time: 04/16/23  3:39 PM  Result Value Ref Range Status   Enterococcus faecalis NOT DETECTED NOT DETECTED Final   Enterococcus Faecium NOT DETECTED NOT DETECTED Final   Listeria monocytogenes NOT DETECTED NOT DETECTED Final   Staphylococcus species DETECTED (A) NOT DETECTED Final    Comment: CRITICAL  RESULT CALLED TO, READ BACK BY AND VERIFIED WITH: ALEX CHAPPELL 04/17/23 0929 MW    Staphylococcus aureus (BCID) NOT DETECTED NOT DETECTED Final   Staphylococcus epidermidis DETECTED (A) NOT DETECTED Final    Comment: Methicillin (oxacillin) resistant coagulase negative staphylococcus. Possible blood culture contaminant (unless isolated from more than one blood culture draw or clinical case suggests pathogenicity). No antibiotic treatment is indicated for blood  culture contaminants. CRITICAL RESULT CALLED TO, READ BACK BY AND VERIFIED WITH: ALEX CHAPPELL 04/17/23 0929 MW    Staphylococcus lugdunensis NOT DETECTED NOT DETECTED Final   Streptococcus species NOT DETECTED NOT DETECTED Final   Streptococcus agalactiae NOT DETECTED NOT DETECTED Final   Streptococcus pneumoniae NOT DETECTED NOT DETECTED Final   Streptococcus pyogenes NOT DETECTED NOT DETECTED Final   A.calcoaceticus-baumannii NOT DETECTED NOT DETECTED Final   Bacteroides fragilis NOT DETECTED NOT DETECTED Final   Enterobacterales NOT DETECTED NOT DETECTED Final   Enterobacter cloacae complex NOT DETECTED NOT DETECTED Final   Escherichia coli NOT DETECTED NOT DETECTED Final   Klebsiella aerogenes NOT DETECTED NOT DETECTED Final   Klebsiella oxytoca NOT DETECTED NOT DETECTED Final   Klebsiella pneumoniae NOT DETECTED NOT DETECTED Final   Proteus species NOT DETECTED NOT DETECTED Final   Salmonella species NOT DETECTED NOT DETECTED Final   Serratia marcescens NOT DETECTED NOT DETECTED Final   Haemophilus influenzae NOT DETECTED NOT DETECTED  Final   Neisseria meningitidis NOT DETECTED NOT DETECTED Final   Pseudomonas aeruginosa NOT DETECTED NOT DETECTED Final   Stenotrophomonas maltophilia NOT DETECTED NOT DETECTED Final   Candida albicans NOT DETECTED NOT DETECTED Final   Candida auris NOT DETECTED NOT DETECTED Final   Candida glabrata NOT DETECTED NOT DETECTED Final   Candida krusei NOT DETECTED NOT DETECTED Final   Candida  parapsilosis NOT DETECTED NOT DETECTED Final   Candida tropicalis NOT DETECTED NOT DETECTED Final   Cryptococcus neoformans/gattii NOT DETECTED NOT DETECTED Final   Methicillin resistance mecA/C DETECTED (A) NOT DETECTED Final    Comment: CRITICAL RESULT CALLED TO, READ BACK BY AND VERIFIED WITH: ALEX CHAPPELL 04/17/23 1610 MW Performed at [facility], [address] [place], [place] [address]   Urine Culture (for pregnant, neutropenic or urologic patients or patients with an indwelling urinary catheter)     Status: Abnormal   Collection Time: 04/17/23  5:26 AM   Specimen: Urine, Clean Catch  Result Value Ref Range Status   Specimen Description   Final    URINE, CLEAN CATCH Performed at [facility], [address] [place], [place] [address]    Special Requests   Final    NONE Performed at [facility], [address] [place], [place] [address]    Culture (A)  Final    40,000 COLONIES/mL MULTIPLE SPECIES PRESENT, SUGGEST RECOLLECTION   Report Status 04/18/2023 FINAL  Final  Culture, blood (Routine X 2) w Reflex to ID Panel     Status: None (Preliminary result)   Collection Time: 04/17/23 10:01 AM   Specimen: BLOOD  Result Value Ref Range Status   Specimen Description BLOOD DYALISIS  Final   Special Requests   Final    BOTTLES DRAWN AEROBIC AND ANAEROBIC Blood Culture adequate volume   Culture   Final    NO GROWTH < 24 HOURS Performed at [facility], [address] [place], [place] [address]    Report Status PENDING  Incomplete  Culture, blood (Routine X 2) w Reflex to ID Panel     Status: None (Preliminary result)   Collection Time: 04/17/23 11:17 AM   Specimen: BLOOD  Result Value Ref Range Status   Specimen Description BLOOD BLOOD LEFT HAND  Final   Special Requests   Final    BOTTLES DRAWN AEROBIC AND ANAEROBIC Blood Culture adequate volume   Culture  Setup Time   Final    GRAM NEGATIVE RODS ANAEROBIC BOTTLE ONLY Organism  ID to follow CRITICAL RESULT CALLED TO, READ BACK BY AND VERIFIED WITH: NATHN [email or web address]@[id number]  04/19/23 RH    Culture   Final    NO GROWTH < 24 HOURS Performed at [facility], [address] [place], [place] [address]    Report Status PENDING  Incomplete  Blood Culture ID Panel (Reflexed)     Status: None   Collection Time: 04/17/23 11:17 AM  Result Value Ref Range Status   Enterococcus faecalis NOT DETECTED NOT DETECTED Final   Enterococcus Faecium NOT DETECTED NOT DETECTED Final   Listeria monocytogenes NOT DETECTED NOT DETECTED Final   Staphylococcus species NOT DETECTED NOT DETECTED Final   Staphylococcus aureus (BCID) NOT DETECTED NOT DETECTED Final   Staphylococcus epidermidis NOT DETECTED NOT DETECTED Final   Staphylococcus lugdunensis NOT DETECTED NOT DETECTED Final   Streptococcus species NOT DETECTED NOT DETECTED Final   Streptococcus agalactiae NOT DETECTED NOT DETECTED Final   Streptococcus pneumoniae NOT DETECTED NOT DETECTED Final  Streptococcus pyogenes NOT DETECTED NOT DETECTED Final   A.calcoaceticus-baumannii NOT DETECTED NOT DETECTED Final   Bacteroides fragilis NOT DETECTED NOT DETECTED Final   Enterobacterales NOT DETECTED NOT DETECTED Final   Enterobacter cloacae complex NOT DETECTED NOT DETECTED Final   Escherichia coli NOT DETECTED NOT DETECTED Final   Klebsiella aerogenes NOT DETECTED NOT DETECTED Final   Klebsiella oxytoca NOT DETECTED NOT DETECTED Final   Klebsiella pneumoniae NOT DETECTED NOT DETECTED Final   Proteus species NOT DETECTED NOT DETECTED Final   Salmonella species NOT DETECTED NOT DETECTED Final   Serratia marcescens NOT DETECTED NOT DETECTED Final   Haemophilus influenzae NOT DETECTED NOT DETECTED Final   Neisseria meningitidis NOT DETECTED NOT DETECTED Final   Pseudomonas aeruginosa NOT DETECTED NOT DETECTED Final   Stenotrophomonas maltophilia NOT DETECTED NOT DETECTED Final   Candida albicans NOT DETECTED NOT DETECTED Final    Candida auris NOT DETECTED NOT DETECTED Final   Candida glabrata NOT DETECTED NOT DETECTED Final   Candida krusei NOT DETECTED NOT DETECTED Final   Candida parapsilosis NOT DETECTED NOT DETECTED Final   Candida tropicalis NOT DETECTED NOT DETECTED Final   Cryptococcus neoformans/gattii NOT DETECTED NOT DETECTED Final    Comment: Performed at [facility], [address] [place], [place] [address]  MRSA Next Gen by PCR, Nasal     Status: None   Collection Time: 04/17/23  4:11 PM   Specimen: Nasal Mucosa; Nasal Swab  Result Value Ref Range Status   MRSA by PCR Next Gen NOT DETECTED NOT DETECTED Final    Comment: (NOTE) The GeneXpert MRSA Assay (FDA approved for NASAL specimens only), is one component of a comprehensive MRSA colonization surveillance program. It is not intended to diagnose MRSA infection nor to guide or monitor treatment for MRSA infections. Test performance is not FDA approved in patients less than 46 years old. Performed at [facility], [address] [place], [place] [address]   Cath Tip Culture     Status: None (Preliminary result)   Collection Time: 04/17/23  4:54 PM   Specimen: Vein; Other  Result Value Ref Range Status   Specimen Description   Final    VEIN Performed at [facility], [address], [place], [place] [address]    Special Requests   Final    Normal Performed at [facility], [address] [place], [place] [address]    Culture   Final    NO GROWTH < 12 HOURS Performed at [facility], [address] [place], [place] [address]    Report Status PENDING  Incomplete    BCID Results: 1 (anaerobic) bottle of 4 w/ GNR.  Lab sample being sent to [facility] for further identification.  Name of provider contacted: Provider not notified at this time, pending additional lab cx results.   Changes to prescribed antibiotics required: No changes at this time.  Otelia Sergeant, PharmD, [facility] 04/19/2023 2:53  AM

## 2023-04-19 NOTE — Progress Notes (Signed)
[facility] Liaison Note  Follow up on new referral for IPU.  Patient not appropriate for IPU and so discharge plan is patient will discharge back home where she lives with her daughter, Kevina Piloto.  Discussed DME needs and [name] now request hospital bed, over bed table and shower bench.  [name] is primary contact for DME delivery.  DME ordered and will be delivered tomorrow.  Admission visit set for 6pm tomorrow night 10.18.24.  Patient will need to transport home via EMS and will need to be home before 6pm for scheduled visit.  Medical team updated on discharge plan.  Please do not hesitate to call with any hospice related questions. Thank you for allowing participation in this patient's care.  Norris Cross, RN Nurse Liaison [phone]

## 2023-04-20 DIAGNOSIS — A411 Sepsis due to other specified staphylococcus: Secondary | ICD-10-CM | POA: Diagnosis not present

## 2023-04-20 LAB — CATH TIP CULTURE
Culture: NO GROWTH
Special Requests: NORMAL

## 2023-04-20 LAB — CULTURE, BLOOD (ROUTINE X 2): Special Requests: ADEQUATE

## 2023-04-20 SURGERY — LOWER EXTREMITY ANGIOGRAPHY
Anesthesia: Moderate Sedation | Laterality: Left

## 2023-04-20 MED ORDER — OXYCODONE HCL 20 MG/ML PO CONC: 6.0000 mg | ORAL | 0 refills | Status: DC | PRN

## 2023-04-20 MED ORDER — MIRTAZAPINE 7.5 MG PO TABS: 7.5000 mg | ORAL_TABLET | Freq: Every day | ORAL | 0 refills | Status: DC

## 2023-04-20 MED ORDER — LORAZEPAM 2 MG/ML PO CONC: 0.6000 mg | ORAL | 0 refills | Status: DC | PRN

## 2023-04-20 NOTE — Progress Notes (Signed)
[facility] Liaison Note  Follow up on new referral for home hospice.  Patient with plans to discharge today.  DME to be delivered today between 10-12.  Admission visit set for this evening at 1800. Spoke with [name], patient's daughter to confirm discharge plan.  She has numbers to [facility] if needed.  Thank you for allowing participation in this patient's care.  Norris Cross, RN Nurse Liaison [phone]

## 2023-04-20 NOTE — Discharge Summary (Signed)
Physician Discharge Summary  Zan Loayza WUJ:[id number] DOB: 1941/05/11 DOA: 04/16/2023  PCP: Eustaquio Boyden, MD  Admit date: 04/16/2023 Discharge date: 04/20/2023  Admitted From: Home Disposition:  Home with hospice  Recommendations for Outpatient Follow-up:  Per hospice provider   Home Health:NA Equipment/Devices:None   Discharge Condition:Hospice  CODE STATUS:DNR Diet recommendation: Comfort/regular  Brief/Interim Summary:  82yo with history of hypertension, hypothyroid, ESRD on MWF HD, hypothyroidism, PAF, and breast cancer who presented on 10/14 with weakness/lethargy and fever.  COVID/flu/RSV negative.  Concern for infectious etiology, given Ceftriaxone and Azithromycin.  Was in HD today and RRT was called, developed RVR in the setting of known afib.  Started on Cardizem drip and transferred to progressive care.  UA is suggestive of infection and she also has 2 necrotic toes.    Dialysis access removed by vascular surgery given possible source of infection.  Patient without dialysis access.  Ongoing discussions with palliative care regarding goals of care  Decision made to proceed with full comfort measures and hospice referral.  Patient was deemed not a candidate for IPU.  Discharged home with hospice services.  DNR in place.    Discharge Diagnoses:  Principal Problem:   Sepsis due to Staphylococcus epidermidis (HCC) Active Problems:   ESRD (end stage renal disease) on dialysis (HCC)   Post-surgical hypothyroidism   HTN (hypertension)   Chronic atrial fibrillation with RVR (HCC)   Type 2 diabetes mellitus with other specified complication (HCC)   OA (osteoarthritis)   History of breast cancer   DNR (do not resuscitate)   Anemia of chronic disease   Protein-calorie malnutrition, severe (HCC)   History of removal of tunneled central venous catheter (CVC)   Staphylococcus epidermidis bacteremia   PVD (peripheral vascular disease) (HCC)   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  Palliative care and discussions with patient's daughter regarding overall GOC.  At this time my recommendation would be further de-escalation of care and consideration for engagement of hospice services.  Patient's prognosis is poor.  She appears to be suffering.  Aggressive medical interventions will likely not changes patient's trajectory unfortunately and may worsen pain and quality of life   10/17: After discussion between patient, her daughter, palliative care on 10/16 the decision was made to proceed with full comfort measures and hospice referral.  Unfortunately patient was deemed not a candidate for inpatient hospice unit.  At this time current plan of care is for discharge home with hospice services.  Plan for discharge 10/18  Discharged home with hospice on 10/18.  Discharge Instructions  Discharge Instructions     Diet - low sodium heart healthy   Complete by: As directed    Increase activity slowly   Complete by: As directed    No wound care   Complete by: As directed       Allergies as of 04/20/2023       Reactions   Tramadol Other (See Comments)   nightmares   Codeine Anxiety   Sulfa Antibiotics Itching, Rash        Medication List     STOP taking these medications    amLODipine 2.5 MG tablet Commonly known as: NORVASC   aspirin EC 81 MG tablet   atorvastatin 10 MG tablet Commonly known as: Lipitor   carvedilol 12.5 MG tablet Commonly known as: COREG  cloNIDine HCl 0.1 MG Tb12 ER tablet Commonly known as: KAPVAY   isosorbide mononitrate 30 MG 24 hr tablet Commonly known as: IMDUR   levothyroxine 88 MCG tablet Commonly known as: SYNTHROID   linagliptin 5 MG Tabs tablet Commonly known as: Tradjenta   pantoprazole 40 MG tablet Commonly known as: PROTONIX   Vitamin D-3 125 MCG (5000 UT) Tabs        TAKE these medications    allopurinol 100 MG tablet Commonly known as: ZYLOPRIM Take 1 tablet (100 mg total) by mouth daily. For gout   anastrozole 1 MG tablet Commonly known as: ARIMIDEX TAKE 1 TABLET BY MOUTH EVERY DAY   apixaban 2.5 MG Tabs tablet Commonly known as: Eliquis Take 1 tablet (2.5 mg total) by mouth 2 (two) times daily. As blood thinner   LORazepam 2 MG/ML concentrated solution Commonly known as: ATIVAN Place 0.3 mLs (0.6 mg total) under the tongue every 4 (four) hours as needed for anxiety.   mirtazapine 7.5 MG tablet Commonly known as: REMERON Take 1 tablet (7.5 mg total) by mouth at bedtime.   oxyCODONE 20 MG/ML concentrated solution Commonly known as: ROXICODONE INTENSOL Place 0.3 mLs (6 mg total) under the tongue every 2 (two) hours as needed for moderate pain (pain score 4-6) (or dyspnea).   sertraline 25 MG tablet Commonly known as: ZOLOFT Take 1 tablet (25 mg total) by mouth daily.               Durable Medical Equipment  (From admission, onward)           Start     Ordered   04/19/23 1150  For home use only DME Bedside commode  Once       Question:  Patient needs a bedside commode to treat with the following condition  Answer:  Weakness   04/19/23 1149   04/19/23 1149  For home use only DME Walker rolling  Once       Question Answer Comment  Walker: With 5 Inch Wheels   Patient needs a walker to treat with the following condition Weakness      04/19/23 1149   04/19/23 1149  For home use only DME lightweight manual wheelchair with seat cushion  Once       Comments: Patient suffers from weakness which impairs their ability to perform daily activities like bathing, dressing, feeding, grooming, and toileting in the home.  A cane, crutch, or walker will not resolve  issue with performing activities of daily living. A wheelchair will allow patient to safely perform daily activities. Patient is not able to propel themselves in the home  using a standard weight wheelchair due to arm weakness, endurance, and general weakness. Patient can self propel in the lightweight wheelchair. Length of need Lifetime. Accessories: elevating leg rests (ELRs), wheel locks, extensions and anti-tippers.   04/19/23 1149            Allergies  Allergen Reactions   Tramadol Other (See Comments)    nightmares   Codeine Anxiety   Sulfa Antibiotics Itching and Rash    Consultations: Palliative care Nephrology   Procedures/Studies: PERIPHERAL VASCULAR CATHETERIZATION  Result Date: 04/17/2023 See surgical note for result.  DG Chest Portable 1 View  Result Date: 04/16/2023 CLINICAL DATA:  Fever EXAM: PORTABLE CHEST 1 VIEW COMPARISON:  12/03/2022 FINDINGS: Underinflation. No consolidation, pneumothorax or effusion. No edema. Normal cardiopericardial silhouette. Calcified aorta overlapping cardiac leads. Right IJ double-lumen catheter with tip along the central SVC. Overlapping  cardiac leads. IMPRESSION: Underinflation. No acute cardiopulmonary disease. Right IJ large-bore double-lumen catheter Electronically Signed   By: Karen Kays M.D.   On: 04/16/2023 14:57   PERIPHERAL VASCULAR CATHETERIZATION  Result Date: 03/29/2023 See surgical note for result.  PERIPHERAL VASCULAR CATHETERIZATION  Result Date: 03/22/2023 See surgical note for result.     Subjective: Seen and examined on day of DC.  Stable.  Appropriate for discharge home with hospice services.  Discharge Exam: Vitals:   04/19/23 0734 04/20/23 0843  BP: 116/70 (!) 156/96  Pulse: 65 (!) 106  Resp: 16 18  Temp: 97.7 F (36.5 C) (!) 97.4 F (36.3 C)  SpO2: 94% 91%   Vitals:   04/18/23 1400 04/18/23 1627 04/19/23 0734 04/20/23 0843  BP: 110/60 117/68 116/70 (!) 156/96  Pulse: (!) 48 77 65 (!) 106  Resp:  20 16 18   Temp:  (!) 97.5 F (36.4 C) 97.7 F (36.5 C) (!) 97.4 F (36.3 C)  TempSrc:  Oral Oral   SpO2: 98% 98% 94% 91%  Weight:        General: Pt is  alert, awake, not in acute distress Cardiovascular: RRR, S1/S2 +, no rubs, no gallops Respiratory: CTA bilaterally, no wheezing, no rhonchi Abdominal: Soft, NT, ND, bowel sounds + Extremities: no edema, no cyanosis    The results of significant diagnostics from this hospitalization (including imaging, microbiology, ancillary and laboratory) are listed below for reference.     Microbiology: Recent Results (from the past 240 hour(s))  Resp panel by RT-PCR (RSV, Flu A&B, Covid) Anterior Nasal Swab     Status: None   Collection Time: 04/16/23 12:08 PM   Specimen: Anterior Nasal Swab  Result Value Ref Range Status   SARS Coronavirus 2 by RT PCR NEGATIVE NEGATIVE Final    Comment: (NOTE) SARS-CoV-2 target nucleic acids are NOT DETECTED.  The SARS-CoV-2 RNA is generally detectable in upper respiratory specimens during the acute phase of infection. The lowest concentration of SARS-CoV-2 viral copies this assay can detect is 138 copies/mL. A negative result does not preclude SARS-Cov-2 infection and should not be used as the sole basis for treatment or other patient management decisions. A negative result may occur with  improper specimen collection/handling, submission of specimen other than nasopharyngeal swab, presence of viral mutation(s) within the areas targeted by this assay, and inadequate number of viral copies(<138 copies/mL). A negative result must be combined with clinical observations, patient history, and epidemiological information. The expected result is Negative.  Fact Sheet for Patients:  BloggerCourse.com  Fact Sheet for Healthcare Providers:  SeriousBroker.it  This test is no t yet approved or cleared by the [place] FDA and  has been authorized for detection and/or diagnosis of SARS-CoV-2 by FDA under an Emergency Use Authorization (EUA). This EUA will remain  in effect (meaning this test can be used) for the  duration of the COVID-19 declaration under Section 564(b)(1) of the Act, 21 U.S.C.section 360bbb-3(b)(1), unless the authorization is terminated  or revoked sooner.       Influenza A by PCR NEGATIVE NEGATIVE Final   Influenza B by PCR NEGATIVE NEGATIVE Final    Comment: (NOTE) The Xpert Xpress SARS-CoV-2/FLU/RSV plus assay is intended as an aid in the diagnosis of influenza from Nasopharyngeal swab specimens and should not be used as a sole basis for treatment. Nasal washings and aspirates are unacceptable for Xpert Xpress SARS-CoV-2/FLU/RSV testing.  Fact Sheet for Patients: BloggerCourse.com  Fact Sheet for Healthcare Providers: SeriousBroker.it  This test  is not yet approved or cleared by the [place] and has been authorized for detection and/or diagnosis of SARS-CoV-2 by FDA under an Emergency Use Authorization (EUA). This EUA will remain in effect (meaning this test can be used) for the duration of the COVID-19 declaration under Section 564(b)(1) of the Act, 21 U.S.C. section 360bbb-3(b)(1), unless the authorization is terminated or revoked.     Resp Syncytial Virus by PCR NEGATIVE NEGATIVE Final    Comment: (NOTE) Fact Sheet for Patients: BloggerCourse.com  Fact Sheet for Healthcare Providers: SeriousBroker.it  This test is not yet approved or cleared by the [place] FDA and has been authorized for detection and/or diagnosis of SARS-CoV-2 by FDA under an Emergency Use Authorization (EUA). This EUA will remain in effect (meaning this test can be used) for the duration of the COVID-19 declaration under Section 564(b)(1) of the Act, 21 U.S.C. section 360bbb-3(b)(1), unless the authorization is terminated or revoked.  Performed at [facility], [address] [place], [place] [address]   Blood culture (routine x 2)     Status: Abnormal  (Preliminary result)   Collection Time: 04/16/23  3:39 PM   Specimen: BLOOD  Result Value Ref Range Status   Specimen Description   Final    BLOOD LEFT ANTECUBITAL Performed at [facility], [address], [place], [place] [address]    Special Requests   Final    BOTTLES DRAWN AEROBIC AND ANAEROBIC Blood Culture results may not be optimal due to an excessive volume of blood received in culture bottles Performed at [facility], [address], [place], [place] [address]    Culture  Setup Time   Final    GRAM POSITIVE COCCI AEROBIC BOTTLE ONLY CRITICAL RESULT CALLED TO, READ BACK BY AND VERIFIED WITH: ALEX CHAPPELL 04/17/23 9147 MW Performed at [facility], [address] [place], [place] [address]    Culture (A)  Final    STAPHYLOCOCCUS HAEMOLYTICUS CULTURE REINCUBATED FOR BETTER GROWTH Performed at [facility], [address], [place], [place] [address]    Report Status PENDING  Incomplete  Blood culture (routine x 2)     Status: Abnormal (Preliminary result)   Collection Time: 04/16/23  3:39 PM   Specimen: BLOOD  Result Value Ref Range Status   Specimen Description   Final    BLOOD BLOOD RIGHT HAND Performed at [facility], [address], [place], [place] [address]    Special Requests   Final    BOTTLES DRAWN AEROBIC AND ANAEROBIC Blood Culture adequate volume Performed at [facility], [address] [place], [place] [address]    Culture  Setup Time   Final    GRAM POSITIVE COCCI IN BOTH AEROBIC AND ANAEROBIC BOTTLES CRITICAL RESULT CALLED TO, READ BACK BY AND VERIFIED WITH: ALEX CHAPPELL 04/17/23 0929 MW    Culture (A)  Final    STAPHYLOCOCCUS CAPITIS CULTURE REINCUBATED FOR BETTER GROWTH Performed at [facility] [place] [facility], [address], [place], [place] [address]    Report Status PENDING  Incomplete  Blood Culture ID Panel (Reflexed)     Status: Abnormal   Collection Time: 04/16/23  3:39 PM  Result  Value Ref Range Status   Enterococcus faecalis NOT DETECTED NOT DETECTED Final   Enterococcus Faecium NOT DETECTED NOT DETECTED Final   Listeria monocytogenes NOT DETECTED NOT DETECTED Final   Staphylococcus species DETECTED (A) NOT DETECTED Final    Comment: CRITICAL RESULT CALLED TO,  READ BACK BY AND VERIFIED WITH: ALEX CHAPPELL 04/17/23 0929 MW    Staphylococcus aureus (BCID) NOT DETECTED NOT DETECTED Final   Staphylococcus epidermidis DETECTED (A) NOT DETECTED Final    Comment: Methicillin (oxacillin) resistant coagulase negative staphylococcus. Possible blood culture contaminant (unless isolated from more than one blood culture draw or clinical case suggests pathogenicity). No antibiotic treatment is indicated for blood  culture contaminants. CRITICAL RESULT CALLED TO, READ BACK BY AND VERIFIED WITH: ALEX CHAPPELL 04/17/23 0929 MW    Staphylococcus lugdunensis NOT DETECTED NOT DETECTED Final   Streptococcus species NOT DETECTED NOT DETECTED Final   Streptococcus agalactiae NOT DETECTED NOT DETECTED Final   Streptococcus pneumoniae NOT DETECTED NOT DETECTED Final   Streptococcus pyogenes NOT DETECTED NOT DETECTED Final   A.calcoaceticus-baumannii NOT DETECTED NOT DETECTED Final   Bacteroides fragilis NOT DETECTED NOT DETECTED Final   Enterobacterales NOT DETECTED NOT DETECTED Final   Enterobacter cloacae complex NOT DETECTED NOT DETECTED Final   Escherichia coli NOT DETECTED NOT DETECTED Final   Klebsiella aerogenes NOT DETECTED NOT DETECTED Final   Klebsiella oxytoca NOT DETECTED NOT DETECTED Final   Klebsiella pneumoniae NOT DETECTED NOT DETECTED Final   Proteus species NOT DETECTED NOT DETECTED Final   Salmonella species NOT DETECTED NOT DETECTED Final   Serratia marcescens NOT DETECTED NOT DETECTED Final   Haemophilus influenzae NOT DETECTED NOT DETECTED Final   Neisseria meningitidis NOT DETECTED NOT DETECTED Final   Pseudomonas aeruginosa NOT DETECTED NOT DETECTED Final    Stenotrophomonas maltophilia NOT DETECTED NOT DETECTED Final   Candida albicans NOT DETECTED NOT DETECTED Final   Candida auris NOT DETECTED NOT DETECTED Final   Candida glabrata NOT DETECTED NOT DETECTED Final   Candida krusei NOT DETECTED NOT DETECTED Final   Candida parapsilosis NOT DETECTED NOT DETECTED Final   Candida tropicalis NOT DETECTED NOT DETECTED Final   Cryptococcus neoformans/gattii NOT DETECTED NOT DETECTED Final   Methicillin resistance mecA/C DETECTED (A) NOT DETECTED Final    Comment: CRITICAL RESULT CALLED TO, READ BACK BY AND VERIFIED WITH: ALEX CHAPPELL 04/17/23 1610 MW Performed at [facility], [address] [place], [place] [address]   Urine Culture (for pregnant, neutropenic or urologic patients or patients with an indwelling urinary catheter)     Status: Abnormal   Collection Time: 04/17/23  5:26 AM   Specimen: Urine, Clean Catch  Result Value Ref Range Status   Specimen Description   Final    URINE, CLEAN CATCH Performed at [facility], [address], [place], [place] [address]    Special Requests   Final    NONE Performed at [facility], [address] [place], [place] [address]    Culture (A)  Final    40,000 COLONIES/mL MULTIPLE SPECIES PRESENT, SUGGEST RECOLLECTION   Report Status 04/18/2023 FINAL  Final  Culture, blood (Routine X 2) w Reflex to ID Panel     Status: None (Preliminary result)   Collection Time: 04/17/23 10:01 AM   Specimen: BLOOD  Result Value Ref Range Status   Specimen Description   Final    BLOOD DYALISIS Performed at [facility], [address] [place], [place] [address]    Special Requests   Final    BOTTLES DRAWN AEROBIC AND ANAEROBIC Blood Culture adequate volume Performed at [facility], [address] [place], [place] [address]    Culture  Setup Time   Final    ANAEROBIC BOTTLE ONLY GRAM POSITIVE COCCI CRITICAL VALUE NOTED.  VALUE IS CONSISTENT WITH  PREVIOUSLY REPORTED AND CALLED VALUE. SKL Performed at [facility], [address], [place], [place] [address]    Culture GRAM POSITIVE COCCI  Final   Report Status PENDING  Incomplete  Culture, blood (Routine X 2) w Reflex to ID Panel     Status: None (Preliminary result)   Collection Time: 04/17/23 11:17 AM   Specimen: BLOOD  Result Value Ref Range Status   Specimen Description BLOOD BLOOD LEFT HAND  Final   Special Requests   Final    BOTTLES DRAWN AEROBIC AND ANAEROBIC Blood Culture adequate volume   Culture  Setup Time   Final    GRAM NEGATIVE RODS ANAEROBIC BOTTLE ONLY Organism ID to follow CRITICAL RESULT CALLED TO, READ BACK BY AND VERIFIED WITH: [email or web address]  04/19/23 RH    Culture   Final    NO GROWTH 3 DAYS Performed at [facility], [address] [place], [place] [address]    Report Status PENDING  Incomplete  Blood Culture ID Panel (Reflexed)     Status: None   Collection Time: 04/17/23 11:17 AM  Result Value Ref Range Status   Enterococcus faecalis NOT DETECTED NOT DETECTED Final   Enterococcus Faecium NOT DETECTED NOT DETECTED Final   Listeria monocytogenes NOT DETECTED NOT DETECTED Final   Staphylococcus species NOT DETECTED NOT DETECTED Final   Staphylococcus aureus (BCID) NOT DETECTED NOT DETECTED Final   Staphylococcus epidermidis NOT DETECTED NOT DETECTED Final   Staphylococcus lugdunensis NOT DETECTED NOT DETECTED Final   Streptococcus species NOT DETECTED NOT DETECTED Final   Streptococcus agalactiae NOT DETECTED NOT DETECTED Final   Streptococcus pneumoniae NOT DETECTED NOT DETECTED Final   Streptococcus pyogenes NOT DETECTED NOT DETECTED Final   A.calcoaceticus-baumannii NOT DETECTED NOT DETECTED Final   Bacteroides fragilis NOT DETECTED NOT DETECTED Final   Enterobacterales NOT DETECTED NOT DETECTED Final   Enterobacter cloacae complex NOT DETECTED NOT DETECTED Final   Escherichia coli NOT DETECTED NOT DETECTED Final    Klebsiella aerogenes NOT DETECTED NOT DETECTED Final   Klebsiella oxytoca NOT DETECTED NOT DETECTED Final   Klebsiella pneumoniae NOT DETECTED NOT DETECTED Final   Proteus species NOT DETECTED NOT DETECTED Final   Salmonella species NOT DETECTED NOT DETECTED Final   Serratia marcescens NOT DETECTED NOT DETECTED Final   Haemophilus influenzae NOT DETECTED NOT DETECTED Final   Neisseria meningitidis NOT DETECTED NOT DETECTED Final   Pseudomonas aeruginosa NOT DETECTED NOT DETECTED Final   Stenotrophomonas maltophilia NOT DETECTED NOT DETECTED Final   Candida albicans NOT DETECTED NOT DETECTED Final   Candida auris NOT DETECTED NOT DETECTED Final   Candida glabrata NOT DETECTED NOT DETECTED Final   Candida krusei NOT DETECTED NOT DETECTED Final   Candida parapsilosis NOT DETECTED NOT DETECTED Final   Candida tropicalis NOT DETECTED NOT DETECTED Final   Cryptococcus neoformans/gattii NOT DETECTED NOT DETECTED Final    Comment: Performed at [facility], [address] [place], [place] [address]  MRSA Next Gen by PCR, Nasal     Status: None   Collection Time: 04/17/23  4:11 PM   Specimen: Nasal Mucosa; Nasal Swab  Result Value Ref Range Status   MRSA by PCR Next Gen NOT DETECTED NOT DETECTED Final    Comment: (NOTE) The GeneXpert MRSA Assay (FDA approved for NASAL specimens only), is one component of a comprehensive MRSA colonization surveillance program. It is not intended to diagnose MRSA infection nor to guide or monitor treatment for  MRSA infections. Test performance is not FDA approved in patients less than 70 years old. Performed at [facility], [address] [place], [place] [address]   Cath Tip Culture     Status: None   Collection Time: 04/17/23  4:54 PM   Specimen: Vein; Other  Result Value Ref Range Status   Specimen Description   Final    VEIN Performed at [facility], [address], [place], [place] [address]    Special Requests    Final    Normal Performed at [facility], [address] [place], [place] [address]    Culture   Final    NO GROWTH 2 DAYS Performed at [facility], [address] [place], [place] [address]    Report Status 04/20/2023 FINAL  Final     Labs: BNP (last 3 results) Recent Labs    04/22/22 1201 12/03/22 1545  BNP 690.4* 758.4*   Basic Metabolic Panel: Recent Labs  Lab 04/16/23 1147 04/17/23 0837 04/18/23 0354  NA 136 133* 133*  K 3.5 3.2* 4.8  CL 97* 100 102  CO2 27 26 23   GLUCOSE 128* 99 158*  BUN 26* 31* 26*  CREATININE 4.98* 5.21* 4.65*  CALCIUM 8.9 8.1* 8.0*  MG  --   --  1.9  PHOS  --   --  3.0   Liver Function Tests: Recent Labs  Lab 04/16/23 1147  AST 16  ALT 7  ALKPHOS 68  BILITOT 0.8  PROT 7.0  ALBUMIN 2.7*   No results for input(s): "LIPASE", "AMYLASE" in the last 168 hours. No results for input(s): "AMMONIA" in the last 168 hours. CBC: Recent Labs  Lab 04/16/23 1147 04/17/23 0837 04/18/23 0354  WBC 7.6 8.2 23.5*  NEUTROABS  --   --  19.1*  HGB 10.2* 9.2* 9.1*  HCT 32.6* 29.3* 28.8*  MCV 92.6 93.3 94.4  PLT 129* 125* 109*   Cardiac Enzymes: No results for input(s): "CKTOTAL", "CKMB", "CKMBINDEX", "TROPONINI" in the last 168 hours. BNP: Invalid input(s): "POCBNP" CBG: Recent Labs  Lab 04/17/23 1608 04/17/23 2210 04/18/23 1249 04/18/23 1534  GLUCAP 164* 186* 109* 91   D-Dimer No results for input(s): "DDIMER" in the last 72 hours. Hgb A1c No results for input(s): "HGBA1C" in the last 72 hours. Lipid Profile No results for input(s): "CHOL", "HDL", "LDLCALC", "TRIG", "CHOLHDL", "LDLDIRECT" in the last 72 hours. Thyroid function studies No results for input(s): "TSH", "T4TOTAL", "T3FREE", "THYROIDAB" in the last 72 hours.  Invalid input(s): "FREET3" Anemia work up No results for input(s): "VITAMINB12", "FOLATE", "FERRITIN", "TIBC", "IRON", "RETICCTPCT" in the last 72 hours. Urinalysis    Component Value  Date/Time   COLORURINE YELLOW (A) 04/17/2023 0527   APPEARANCEUR TURBID (A) 04/17/2023 0527   LABSPEC 1.017 04/17/2023 0527   PHURINE 8.0 04/17/2023 0527   GLUCOSEU 50 (A) 04/17/2023 0527   HGBUR MODERATE (A) 04/17/2023 0527   BILIRUBINUR SMALL (A) 04/17/2023 0527   KETONESUR NEGATIVE 04/17/2023 0527   PROTEINUR 100 (A) 04/17/2023 0527   NITRITE NEGATIVE 04/17/2023 0527   LEUKOCYTESUR LARGE (A) 04/17/2023 0527   Sepsis Labs Recent Labs  Lab 04/16/23 1147 04/17/23 0837 04/18/23 0354  WBC 7.6 8.2 23.5*   Microbiology Recent Results (from the past 240 hour(s))  Resp panel by RT-PCR (RSV, Flu A&B, Covid) Anterior Nasal Swab     Status: None   Collection Time: 04/16/23 12:08 PM   Specimen: Anterior Nasal Swab  Result Value Ref Range Status  SARS Coronavirus 2 by RT PCR NEGATIVE NEGATIVE Final    Comment: (NOTE) SARS-CoV-2 target nucleic acids are NOT DETECTED.  The SARS-CoV-2 RNA is generally detectable in upper respiratory specimens during the acute phase of infection. The lowest concentration of SARS-CoV-2 viral copies this assay can detect is 138 copies/mL. A negative result does not preclude SARS-Cov-2 infection and should not be used as the sole basis for treatment or other patient management decisions. A negative result may occur with  improper specimen collection/handling, submission of specimen other than nasopharyngeal swab, presence of viral mutation(s) within the areas targeted by this assay, and inadequate number of viral copies(<138 copies/mL). A negative result must be combined with clinical observations, patient history, and epidemiological information. The expected result is Negative.  Fact Sheet for Patients:  BloggerCourse.com  Fact Sheet for Healthcare Providers:  SeriousBroker.it  This test is no t yet approved or cleared by the [place] FDA and  has been authorized for detection and/or diagnosis  of SARS-CoV-2 by FDA under an Emergency Use Authorization (EUA). This EUA will remain  in effect (meaning this test can be used) for the duration of the COVID-19 declaration under Section 564(b)(1) of the Act, 21 U.S.C.section 360bbb-3(b)(1), unless the authorization is terminated  or revoked sooner.       Influenza A by PCR NEGATIVE NEGATIVE Final   Influenza B by PCR NEGATIVE NEGATIVE Final    Comment: (NOTE) The Xpert Xpress SARS-CoV-2/FLU/RSV plus assay is intended as an aid in the diagnosis of influenza from Nasopharyngeal swab specimens and should not be used as a sole basis for treatment. Nasal washings and aspirates are unacceptable for Xpert Xpress SARS-CoV-2/FLU/RSV testing.  Fact Sheet for Patients: BloggerCourse.com  Fact Sheet for Healthcare Providers: SeriousBroker.it  This test is not yet approved or cleared by the [place] FDA and has been authorized for detection and/or diagnosis of SARS-CoV-2 by FDA under an Emergency Use Authorization (EUA). This EUA will remain in effect (meaning this test can be used) for the duration of the COVID-19 declaration under Section 564(b)(1) of the Act, 21 U.S.C. section 360bbb-3(b)(1), unless the authorization is terminated or revoked.     Resp Syncytial Virus by PCR NEGATIVE NEGATIVE Final    Comment: (NOTE) Fact Sheet for Patients: BloggerCourse.com  Fact Sheet for Healthcare Providers: SeriousBroker.it  This test is not yet approved or cleared by the [place] FDA and has been authorized for detection and/or diagnosis of SARS-CoV-2 by FDA under an Emergency Use Authorization (EUA). This EUA will remain in effect (meaning this test can be used) for the duration of the COVID-19 declaration under Section 564(b)(1) of the Act, 21 U.S.C. section 360bbb-3(b)(1), unless the authorization is terminated  or revoked.  Performed at [facility], [address] [place], [place] [address]   Blood culture (routine x 2)     Status: Abnormal (Preliminary result)   Collection Time: 04/16/23  3:39 PM   Specimen: BLOOD  Result Value Ref Range Status   Specimen Description   Final    BLOOD LEFT ANTECUBITAL Performed at [facility], [address] [place], [place] [address]    Special Requests   Final    BOTTLES DRAWN AEROBIC AND ANAEROBIC Blood Culture results may not be optimal due to an excessive volume of blood received in culture bottles Performed at [facility], [address] [place], [place] [address]    Culture  Setup Time   Final    GRAM POSITIVE COCCI AEROBIC  BOTTLE ONLY CRITICAL RESULT CALLED TO, READ BACK BY AND VERIFIED WITH: ALEX CHAPPELL 04/17/23 9147 MW Performed at [facility], [address] [place], [place] [address]    Culture (A)  Final    STAPHYLOCOCCUS HAEMOLYTICUS CULTURE REINCUBATED FOR BETTER GROWTH Performed at [facility], [address], [place], [place] [address]    Report Status PENDING  Incomplete  Blood culture (routine x 2)     Status: Abnormal (Preliminary result)   Collection Time: 04/16/23  3:39 PM   Specimen: BLOOD  Result Value Ref Range Status   Specimen Description   Final    BLOOD BLOOD RIGHT HAND Performed at [facility], [address] [place], [place] [address]    Special Requests   Final    BOTTLES DRAWN AEROBIC AND ANAEROBIC Blood Culture adequate volume Performed at [facility], [address] [place], [place] [address]    Culture  Setup Time   Final    GRAM POSITIVE COCCI IN BOTH AEROBIC AND ANAEROBIC BOTTLES CRITICAL RESULT CALLED TO, READ BACK BY AND VERIFIED WITH: ALEX CHAPPELL 04/17/23 0929 MW    Culture (A)  Final    STAPHYLOCOCCUS CAPITIS CULTURE REINCUBATED FOR BETTER GROWTH Performed at [facility], [address] [place], [place]  [address]    Report Status PENDING  Incomplete  Blood Culture ID Panel (Reflexed)     Status: Abnormal   Collection Time: 04/16/23  3:39 PM  Result Value Ref Range Status   Enterococcus faecalis NOT DETECTED NOT DETECTED Final   Enterococcus Faecium NOT DETECTED NOT DETECTED Final   Listeria monocytogenes NOT DETECTED NOT DETECTED Final   Staphylococcus species DETECTED (A) NOT DETECTED Final    Comment: CRITICAL RESULT CALLED TO, READ BACK BY AND VERIFIED WITH: ALEX CHAPPELL 04/17/23 0929 MW    Staphylococcus aureus (BCID) NOT DETECTED NOT DETECTED Final   Staphylococcus epidermidis DETECTED (A) NOT DETECTED Final    Comment: Methicillin (oxacillin) resistant coagulase negative staphylococcus. Possible blood culture contaminant (unless isolated from more than one blood culture draw or clinical case suggests pathogenicity). No antibiotic treatment is indicated for blood  culture contaminants. CRITICAL RESULT CALLED TO, READ BACK BY AND VERIFIED WITH: ALEX CHAPPELL 04/17/23 0929 MW    Staphylococcus lugdunensis NOT DETECTED NOT DETECTED Final   Streptococcus species NOT DETECTED NOT DETECTED Final   Streptococcus agalactiae NOT DETECTED NOT DETECTED Final   Streptococcus pneumoniae NOT DETECTED NOT DETECTED Final   Streptococcus pyogenes NOT DETECTED NOT DETECTED Final   A.calcoaceticus-baumannii NOT DETECTED NOT DETECTED Final   Bacteroides fragilis NOT DETECTED NOT DETECTED Final   Enterobacterales NOT DETECTED NOT DETECTED Final   Enterobacter cloacae complex NOT DETECTED NOT DETECTED Final   Escherichia coli NOT DETECTED NOT DETECTED Final   Klebsiella aerogenes NOT DETECTED NOT DETECTED Final   Klebsiella oxytoca NOT DETECTED NOT DETECTED Final   Klebsiella pneumoniae NOT DETECTED NOT DETECTED Final   Proteus species NOT DETECTED NOT DETECTED Final   Salmonella species NOT DETECTED NOT DETECTED Final   Serratia marcescens NOT DETECTED NOT DETECTED Final   Haemophilus influenzae  NOT DETECTED NOT DETECTED Final   Neisseria meningitidis NOT DETECTED NOT DETECTED Final   Pseudomonas aeruginosa NOT DETECTED NOT DETECTED Final   Stenotrophomonas maltophilia NOT DETECTED NOT DETECTED Final   Candida albicans NOT DETECTED NOT DETECTED Final   Candida auris NOT DETECTED NOT DETECTED Final   Candida glabrata NOT DETECTED NOT DETECTED Final   Candida krusei NOT  DETECTED NOT DETECTED Final   Candida parapsilosis NOT DETECTED NOT DETECTED Final   Candida tropicalis NOT DETECTED NOT DETECTED Final   Cryptococcus neoformans/gattii NOT DETECTED NOT DETECTED Final   Methicillin resistance mecA/C DETECTED (A) NOT DETECTED Final    Comment: CRITICAL RESULT CALLED TO, READ BACK BY AND VERIFIED WITH: ALEX CHAPPELL 04/17/23 1478 MW Performed at [facility], [address], [place], [place] [address]   Urine Culture (for pregnant, neutropenic or urologic patients or patients with an indwelling urinary catheter)     Status: Abnormal   Collection Time: 04/17/23  5:26 AM   Specimen: Urine, Clean Catch  Result Value Ref Range Status   Specimen Description   Final    URINE, CLEAN CATCH Performed at [facility], [address], [place], [place] [address]    Special Requests   Final    NONE Performed at [facility], [address], [place], [place] [address]    Culture (A)  Final    40,000 COLONIES/mL MULTIPLE SPECIES PRESENT, SUGGEST RECOLLECTION   Report Status 04/18/2023 FINAL  Final  Culture, blood (Routine X 2) w Reflex to ID Panel     Status: None (Preliminary result)   Collection Time: 04/17/23 10:01 AM   Specimen: BLOOD  Result Value Ref Range Status   Specimen Description   Final    BLOOD DYALISIS Performed at [facility], [address], [place], [place] [address]    Special Requests   Final    BOTTLES DRAWN AEROBIC AND ANAEROBIC Blood Culture adequate volume Performed at [facility], [address]  [place], [place] [address]    Culture  Setup Time   Final    ANAEROBIC BOTTLE ONLY GRAM POSITIVE COCCI CRITICAL VALUE NOTED.  VALUE IS CONSISTENT WITH PREVIOUSLY REPORTED AND CALLED VALUE. SKL Performed at [facility], [address] [place], [place] [address]    Culture GRAM POSITIVE COCCI  Final   Report Status PENDING  Incomplete  Culture, blood (Routine X 2) w Reflex to ID Panel     Status: None (Preliminary result)   Collection Time: 04/17/23 11:17 AM   Specimen: BLOOD  Result Value Ref Range Status   Specimen Description BLOOD BLOOD LEFT HAND  Final   Special Requests   Final    BOTTLES DRAWN AEROBIC AND ANAEROBIC Blood Culture adequate volume   Culture  Setup Time   Final    GRAM NEGATIVE RODS ANAEROBIC BOTTLE ONLY Organism ID to follow CRITICAL RESULT CALLED TO, READ BACK BY AND VERIFIED WITH: [email or web address]  04/19/23 RH    Culture   Final    NO GROWTH 3 DAYS Performed at [facility], [address] [place], [place] [address]    Report Status PENDING  Incomplete  Blood Culture ID Panel (Reflexed)     Status: None   Collection Time: 04/17/23 11:17 AM  Result Value Ref Range Status   Enterococcus faecalis NOT DETECTED NOT DETECTED Final   Enterococcus Faecium NOT DETECTED NOT DETECTED Final   Listeria monocytogenes NOT DETECTED NOT DETECTED Final   Staphylococcus species NOT DETECTED NOT DETECTED Final   Staphylococcus aureus (BCID) NOT DETECTED NOT DETECTED Final   Staphylococcus epidermidis NOT DETECTED NOT DETECTED Final   Staphylococcus lugdunensis NOT DETECTED NOT DETECTED Final   Streptococcus species NOT DETECTED NOT DETECTED Final   Streptococcus agalactiae NOT DETECTED NOT DETECTED Final   Streptococcus pneumoniae NOT DETECTED NOT DETECTED Final   Streptococcus pyogenes NOT  DETECTED NOT DETECTED Final   A.calcoaceticus-baumannii NOT DETECTED NOT DETECTED Final   Bacteroides fragilis NOT DETECTED NOT DETECTED Final   Enterobacterales NOT  DETECTED NOT DETECTED Final   Enterobacter cloacae complex NOT DETECTED NOT DETECTED Final   Escherichia coli NOT DETECTED NOT DETECTED Final   Klebsiella aerogenes NOT DETECTED NOT DETECTED Final   Klebsiella oxytoca NOT DETECTED NOT DETECTED Final   Klebsiella pneumoniae NOT DETECTED NOT DETECTED Final   Proteus species NOT DETECTED NOT DETECTED Final   Salmonella species NOT DETECTED NOT DETECTED Final   Serratia marcescens NOT DETECTED NOT DETECTED Final   Haemophilus influenzae NOT DETECTED NOT DETECTED Final   Neisseria meningitidis NOT DETECTED NOT DETECTED Final   Pseudomonas aeruginosa NOT DETECTED NOT DETECTED Final   Stenotrophomonas maltophilia NOT DETECTED NOT DETECTED Final   Candida albicans NOT DETECTED NOT DETECTED Final   Candida auris NOT DETECTED NOT DETECTED Final   Candida glabrata NOT DETECTED NOT DETECTED Final   Candida krusei NOT DETECTED NOT DETECTED Final   Candida parapsilosis NOT DETECTED NOT DETECTED Final   Candida tropicalis NOT DETECTED NOT DETECTED Final   Cryptococcus neoformans/gattii NOT DETECTED NOT DETECTED Final    Comment: Performed at [facility], [address] [place], [place] [address]  MRSA Next Gen by PCR, Nasal     Status: None   Collection Time: 04/17/23  4:11 PM   Specimen: Nasal Mucosa; Nasal Swab  Result Value Ref Range Status   MRSA by PCR Next Gen NOT DETECTED NOT DETECTED Final    Comment: (NOTE) The GeneXpert MRSA Assay (FDA approved for NASAL specimens only), is one component of a comprehensive MRSA colonization surveillance program. It is not intended to diagnose MRSA infection nor to guide or monitor treatment for MRSA infections. Test performance is not FDA approved in patients less than 60 years old. Performed at [facility], [address] [place], [place] [address]   Cath Tip Culture     Status: None   Collection Time: 04/17/23  4:54 PM   Specimen: Vein; Other  Result Value Ref Range Status    Specimen Description   Final    VEIN Performed at [facility], [address], [place], [place] [address]    Special Requests   Final    Normal Performed at [facility], [address] [place], [place] [address]    Culture   Final    NO GROWTH 2 DAYS Performed at [facility], [address], [place], [place] [address]    Report Status 04/20/2023 FINAL  Final     Time coordinating discharge: Over 30 minutes  SIGNED:   Tresa Moore, MD  [facility] 04/20/2023, 8:54 AM Pager   If 7PM-7AM, please contact night-coverage

## 2023-04-20 NOTE — Care Management Important Message (Signed)
Important Message  Patient Details  Name: Beebe Counce MRN: [id number] Date of Birth: 1940/11/07   Important Message Given:  Other (see comment)  Patient is on comfort care and going home with hospice. Out of respect for the patient and family no Important Message from [facility] given.   Olegario Messier A Paz Fuentes 04/20/2023, 8:27 AM

## 2023-04-20 NOTE — Consult Note (Signed)
[facility]  [facility] Inpatient Consult   04/20/2023  Shanekia Nealey May 28, 1941 [id number]  [facility] [[facility]]  Accountable Care Organization [ACO] Patient:  Medicare ACO REACH  Primary Care Provider: Eustaquio Boyden, MD with Corinda Gubler at [facility]   [facility] Liaison remote coverage review for patient with extreme high risk score admitted to [facility] coverage for Elliot Cousin, RN HL  Chart reviewed for disposition and reveals the patient is currently transitioning to Hospice Care at home per inpatient [facility] RN  Plan: Patient will have full case management services through Hospice and needs will be met at the hospice level of care. No [facility] Care Management is planned for transitional needs. Will sign off at transition from hospital.  For questions,   Charlesetta Shanks, RN, BSN, CCM [place]  [facility] Liaison Direct Dial: [phone] or secure chat Website: [email or web address]

## 2023-04-20 NOTE — TOC Transition Note (Signed)
Transition of Care [facility]) - CM/SW Discharge Note   Patient Details  Name: Alexis Tucker MRN: [id number] Date of Birth: July 02, 1941  Transition of Care [facility]) CM/SW Contact:  Garret Reddish, RN Phone Number: 04/20/2023, 10:45 AM   Clinical Narrative:     Chart reviewed.  Noted that patient will be a discharge home today.    [facility] will follow patient at home with support of family.  Diannia Ruder, with [facility] has informed me that patient's DME will be delivered between 10 am and 12 noon.  She has asked me to arranged EMS transport for after 12 noon today.    I have spoken with Mrs. [name] patient's daughter.  She has confirmed that home address is [address] [place], [place] [address]. I have informed Mrs. [name] that I will arrange EMS transport for after 12 noon today with [facility].    I have arranged [facility] to transport patient to her home today.    I have informed staff nurse of the above information.    Final next level of care: Home w Hospice Care (Patient will be going home with [facility] and support of family.) Barriers to Discharge: No Barriers Identified   Patient Goals and CMS Choice CMS Medicare.gov Compare Post Acute Care list provided to:: Patient Represenative (must comment) [name]) Choice offered to / list presented to : Adult Children  Discharge Placement                  Patient to be transferred to facility by: [facility] Name of family member notified: [name] Patient and family notified of of transfer: 04/20/23  Discharge Plan and Services Additional resources added to the After Visit Summary for       Post Acute Care Choice:  (TBD)          DME Arranged:  ([facility])                    Social Determinants of Health (SDOH) Interventions SDOH Screenings   Food Insecurity: No Food Insecurity (02/13/2023)  Housing: Low Risk  (02/13/2023)   Transportation Needs: No Transportation Needs (02/13/2023)  Utilities: Not At Risk (02/13/2023)  Alcohol Screen: Low Risk  (02/13/2023)  Depression (PHQ2-9): Low Risk  (02/13/2023)  Financial Resource Strain: Low Risk  (02/13/2023)  Physical Activity: Inactive (02/13/2023)  Social Connections: Socially Isolated (02/13/2023)  Stress: No Stress Concern Present (02/13/2023)  Tobacco Use: Medium Risk (04/16/2023)  Health Literacy: Adequate Health Literacy (02/13/2023)     Readmission Risk Interventions    04/17/2023   10:00 AM 12/07/2022   12:55 PM  Readmission Risk Prevention Plan  Transportation Screening Complete Complete  PCP or Specialist Appt within 3-5 Days  Complete  HRI or Home Care Consult  Complete  Social Work Consult for Recovery Care Planning/Counseling  Complete  Palliative Care Screening  Not Applicable  Medication Review Oceanographer) Complete Complete  PCP or Specialist appointment within 3-5 days of discharge Complete   SW Recovery Care/Counseling Consult Complete   Palliative Care Screening Not Applicable   Skilled Nursing Facility Not Applicable

## 2023-04-21 LAB — CULTURE, BLOOD (ROUTINE X 2): Special Requests: ADEQUATE

## 2023-04-22 LAB — CULTURE, BLOOD (ROUTINE X 2): Special Requests: ADEQUATE

## 2023-04-23 ENCOUNTER — Telehealth: Payer: Self-pay | Admitting: Podiatry

## 2023-04-23 NOTE — Telephone Encounter (Signed)
Patients daughter [name] called LM on after hours stating this pt has black toes, Dr Allena Katz hd seen her before and Dr Logan Bores saw her in the hospital. 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 The daughter has some questions about that what should she do? What should she expect? Per the daughter the patient came home on hospice care. She would like for someone to please call her. [phone].

## 2023-04-23 NOTE — Telephone Encounter (Signed)
Called and talked to daughter to let her know I received her message and that I sent a message to doctors. Daughter states someone from our practice called her and talked with her. I let daughter know if she needs anything else to please call us.

## 2023-04-23 NOTE — Telephone Encounter (Signed)
Spoke with patient's daughter.  Answered all questions.  She will contact her office as needed.  Thanks, Dr. Logan Bores

## 2023-04-24 DIAGNOSIS — R0689 Other abnormalities of breathing: Secondary | ICD-10-CM | POA: Diagnosis not present

## 2023-04-24 DIAGNOSIS — R404 Transient alteration of awareness: Secondary | ICD-10-CM | POA: Diagnosis not present

## 2023-04-24 DIAGNOSIS — I10 Essential (primary) hypertension: Secondary | ICD-10-CM | POA: Diagnosis not present

## 2023-04-24 DIAGNOSIS — R1111 Vomiting without nausea: Secondary | ICD-10-CM | POA: Diagnosis not present

## 2023-04-24 DIAGNOSIS — R000 Tachycardia, unspecified: Secondary | ICD-10-CM | POA: Diagnosis not present

## 2023-04-25 ENCOUNTER — Encounter (INDEPENDENT_AMBULATORY_CARE_PROVIDER_SITE_OTHER): Payer: Medicare Other

## 2023-04-25 ENCOUNTER — Ambulatory Visit: Admit: 2023-04-25 | Payer: Medicare Other | Admitting: Vascular Surgery

## 2023-04-25 ENCOUNTER — Ambulatory Visit (INDEPENDENT_AMBULATORY_CARE_PROVIDER_SITE_OTHER): Payer: Medicare Other | Admitting: Nurse Practitioner

## 2023-04-25 DIAGNOSIS — N186 End stage renal disease: Secondary | ICD-10-CM | POA: Diagnosis not present

## 2023-04-25 DIAGNOSIS — Z992 Dependence on renal dialysis: Secondary | ICD-10-CM | POA: Diagnosis not present

## 2023-04-26 ENCOUNTER — Encounter: Payer: Self-pay | Admitting: Family Medicine

## 2023-04-26 ENCOUNTER — Encounter (INDEPENDENT_AMBULATORY_CARE_PROVIDER_SITE_OTHER): Payer: Self-pay | Admitting: Vascular Surgery

## 2023-04-27 NOTE — Telephone Encounter (Signed)
Called daughter [name] to express my condolences. Left message. Unable to send mychart message.

## 2023-05-01 ENCOUNTER — Ambulatory Visit (INDEPENDENT_AMBULATORY_CARE_PROVIDER_SITE_OTHER): Payer: Medicare Other | Admitting: Nurse Practitioner

## 2023-05-01 ENCOUNTER — Encounter (INDEPENDENT_AMBULATORY_CARE_PROVIDER_SITE_OTHER): Payer: Medicare Other

## 2023-05-04 DEATH — deceased

## 2023-05-08 ENCOUNTER — Ambulatory Visit: Payer: Medicare Other | Admitting: Podiatry
